# Patient Record
Sex: Female | Born: 1945 | Race: Black or African American | Hispanic: No | Smoking: Never smoker
Health system: Southern US, Community
[De-identification: ages and names within clinical notes are randomized; demographics above are authoritative.]

## PROBLEM LIST (undated history)

## (undated) DIAGNOSIS — J309 Allergic rhinitis, unspecified: Secondary | ICD-10-CM

## (undated) DIAGNOSIS — M1990 Unspecified osteoarthritis, unspecified site: Secondary | ICD-10-CM

## (undated) DIAGNOSIS — G4733 Obstructive sleep apnea (adult) (pediatric): Secondary | ICD-10-CM

## (undated) DIAGNOSIS — J329 Chronic sinusitis, unspecified: Secondary | ICD-10-CM

## (undated) DIAGNOSIS — K589 Irritable bowel syndrome without diarrhea: Secondary | ICD-10-CM

## (undated) DIAGNOSIS — R29898 Other symptoms and signs involving the musculoskeletal system: Secondary | ICD-10-CM

## (undated) DIAGNOSIS — H15109 Unspecified episcleritis, unspecified eye: Secondary | ICD-10-CM

## (undated) DIAGNOSIS — G4700 Insomnia, unspecified: Secondary | ICD-10-CM

## (undated) DIAGNOSIS — K219 Gastro-esophageal reflux disease without esophagitis: Secondary | ICD-10-CM

## (undated) DIAGNOSIS — I7300 Raynaud's syndrome without gangrene: Secondary | ICD-10-CM

## (undated) DIAGNOSIS — R079 Chest pain, unspecified: Secondary | ICD-10-CM

## (undated) DIAGNOSIS — E7800 Pure hypercholesterolemia, unspecified: Secondary | ICD-10-CM

## (undated) DIAGNOSIS — I10 Essential (primary) hypertension: Secondary | ICD-10-CM

## (undated) DIAGNOSIS — R269 Unspecified abnormalities of gait and mobility: Secondary | ICD-10-CM

## (undated) DIAGNOSIS — J45909 Unspecified asthma, uncomplicated: Secondary | ICD-10-CM

## (undated) DIAGNOSIS — D259 Leiomyoma of uterus, unspecified: Secondary | ICD-10-CM

## (undated) DIAGNOSIS — M797 Fibromyalgia: Secondary | ICD-10-CM

## (undated) DIAGNOSIS — J479 Bronchiectasis, uncomplicated: Secondary | ICD-10-CM

## (undated) DIAGNOSIS — Z780 Asymptomatic menopausal state: Secondary | ICD-10-CM

## (undated) DIAGNOSIS — R7309 Other abnormal glucose: Secondary | ICD-10-CM

## (undated) DIAGNOSIS — Z86010 Personal history of colonic polyps: Secondary | ICD-10-CM

## (undated) HISTORY — DX: Gastro-esophageal reflux disease without esophagitis: K21.9

## (undated) HISTORY — DX: Unspecified abnormalities of gait and mobility: R26.9

## (undated) HISTORY — DX: Irritable bowel syndrome without diarrhea: K58.9

## (undated) HISTORY — PX: NASAL SEPTUM SURGERY: SHX37

## (undated) HISTORY — DX: Other symptoms and signs involving the musculoskeletal system: R29.898

## (undated) HISTORY — PX: COLONOSCOPY W/ POLYPECTOMY: SHX1380

## (undated) HISTORY — DX: Unspecified osteoarthritis, unspecified site: M19.90

## (undated) HISTORY — DX: Bronchiectasis, uncomplicated: J47.9

## (undated) HISTORY — DX: Asymptomatic menopausal state: Z78.0

## (undated) HISTORY — DX: Unspecified asthma, uncomplicated: J45.909

## (undated) HISTORY — DX: Fibromyalgia: M79.7

## (undated) HISTORY — DX: Personal history of colonic polyps: Z86.010

## (undated) HISTORY — DX: Chronic sinusitis, unspecified: J32.9

## (undated) HISTORY — DX: Raynaud's syndrome without gangrene: I73.00

## (undated) HISTORY — DX: Allergic rhinitis, unspecified: J30.9

## (undated) HISTORY — DX: Unspecified episcleritis, unspecified eye: H15.109

## (undated) HISTORY — DX: Other abnormal glucose: R73.09

## (undated) HISTORY — DX: Chest pain, unspecified: R07.9

## (undated) HISTORY — DX: Leiomyoma of uterus, unspecified: D25.9

## (undated) HISTORY — DX: Essential (primary) hypertension: I10

## (undated) HISTORY — DX: Insomnia, unspecified: G47.00

## (undated) HISTORY — DX: Pure hypercholesterolemia, unspecified: E78.00

---

## 1998-11-21 ENCOUNTER — Encounter: Payer: Self-pay | Admitting: Gastroenterology

## 1999-11-14 ENCOUNTER — Encounter: Admission: RE | Admit: 1999-11-14 | Discharge: 1999-11-14 | Payer: Self-pay | Admitting: Allergy and Immunology

## 1999-11-14 ENCOUNTER — Encounter: Payer: Self-pay | Admitting: Pediatrics

## 2000-06-05 ENCOUNTER — Encounter: Payer: Self-pay | Admitting: Family Medicine

## 2000-06-05 ENCOUNTER — Encounter: Admission: RE | Admit: 2000-06-05 | Discharge: 2000-06-05 | Payer: Self-pay | Admitting: Family Medicine

## 2000-10-26 ENCOUNTER — Emergency Department (HOSPITAL_COMMUNITY): Admission: EM | Admit: 2000-10-26 | Discharge: 2000-10-26 | Payer: Self-pay | Admitting: Emergency Medicine

## 2000-10-26 ENCOUNTER — Encounter: Payer: Self-pay | Admitting: Emergency Medicine

## 2000-10-28 ENCOUNTER — Encounter: Payer: Self-pay | Admitting: Urology

## 2000-10-28 ENCOUNTER — Ambulatory Visit (HOSPITAL_COMMUNITY): Admission: RE | Admit: 2000-10-28 | Discharge: 2000-10-28 | Payer: Self-pay | Admitting: Urology

## 2000-10-29 ENCOUNTER — Emergency Department (HOSPITAL_COMMUNITY): Admission: EM | Admit: 2000-10-29 | Discharge: 2000-10-29 | Payer: Self-pay | Admitting: Emergency Medicine

## 2000-11-01 ENCOUNTER — Ambulatory Visit (HOSPITAL_COMMUNITY): Admission: RE | Admit: 2000-11-01 | Discharge: 2000-11-01 | Payer: Self-pay | Admitting: Urology

## 2001-04-11 ENCOUNTER — Encounter: Payer: Self-pay | Admitting: Family Medicine

## 2001-04-11 ENCOUNTER — Encounter: Admission: RE | Admit: 2001-04-11 | Discharge: 2001-04-11 | Payer: Self-pay | Admitting: Family Medicine

## 2001-06-06 ENCOUNTER — Encounter: Admission: RE | Admit: 2001-06-06 | Discharge: 2001-06-06 | Payer: Self-pay | Admitting: Obstetrics and Gynecology

## 2001-06-06 ENCOUNTER — Encounter: Payer: Self-pay | Admitting: Obstetrics and Gynecology

## 2001-06-11 ENCOUNTER — Encounter: Admission: RE | Admit: 2001-06-11 | Discharge: 2001-06-11 | Payer: Self-pay | Admitting: Obstetrics and Gynecology

## 2001-06-11 ENCOUNTER — Encounter: Payer: Self-pay | Admitting: Obstetrics and Gynecology

## 2001-07-08 ENCOUNTER — Encounter: Payer: Self-pay | Admitting: Gastroenterology

## 2001-07-08 ENCOUNTER — Other Ambulatory Visit: Admission: RE | Admit: 2001-07-08 | Discharge: 2001-07-08 | Payer: Self-pay | Admitting: Gastroenterology

## 2001-07-08 ENCOUNTER — Encounter (INDEPENDENT_AMBULATORY_CARE_PROVIDER_SITE_OTHER): Payer: Self-pay | Admitting: Specialist

## 2001-07-12 ENCOUNTER — Emergency Department (HOSPITAL_COMMUNITY): Admission: EM | Admit: 2001-07-12 | Discharge: 2001-07-12 | Payer: Self-pay

## 2002-02-13 ENCOUNTER — Encounter: Payer: Self-pay | Admitting: Endocrinology

## 2002-02-13 HISTORY — PX: OTHER SURGICAL HISTORY: SHX169

## 2002-02-16 ENCOUNTER — Encounter: Payer: Self-pay | Admitting: Obstetrics and Gynecology

## 2002-02-18 ENCOUNTER — Ambulatory Visit (HOSPITAL_COMMUNITY): Admission: RE | Admit: 2002-02-18 | Discharge: 2002-02-18 | Payer: Self-pay | Admitting: Obstetrics and Gynecology

## 2002-02-18 ENCOUNTER — Encounter (INDEPENDENT_AMBULATORY_CARE_PROVIDER_SITE_OTHER): Payer: Self-pay

## 2002-02-18 ENCOUNTER — Encounter (INDEPENDENT_AMBULATORY_CARE_PROVIDER_SITE_OTHER): Payer: Self-pay | Admitting: *Deleted

## 2002-07-14 ENCOUNTER — Encounter: Payer: Self-pay | Admitting: Obstetrics and Gynecology

## 2002-07-14 ENCOUNTER — Encounter: Admission: RE | Admit: 2002-07-14 | Discharge: 2002-07-14 | Payer: Self-pay | Admitting: Obstetrics and Gynecology

## 2003-04-08 ENCOUNTER — Encounter: Payer: Self-pay | Admitting: Gastroenterology

## 2003-09-28 ENCOUNTER — Encounter: Admission: RE | Admit: 2003-09-28 | Discharge: 2003-09-28 | Payer: Self-pay | Admitting: Otolaryngology

## 2003-10-19 ENCOUNTER — Encounter: Admission: RE | Admit: 2003-10-19 | Discharge: 2003-10-19 | Payer: Self-pay | Admitting: Obstetrics and Gynecology

## 2004-03-07 ENCOUNTER — Encounter: Admission: RE | Admit: 2004-03-07 | Discharge: 2004-03-07 | Payer: Self-pay | Admitting: Otolaryngology

## 2004-09-01 ENCOUNTER — Ambulatory Visit: Payer: Self-pay | Admitting: Endocrinology

## 2004-11-10 ENCOUNTER — Ambulatory Visit: Payer: Self-pay | Admitting: Internal Medicine

## 2005-02-08 ENCOUNTER — Ambulatory Visit: Payer: Self-pay | Admitting: Endocrinology

## 2005-02-15 ENCOUNTER — Ambulatory Visit: Payer: Self-pay | Admitting: Endocrinology

## 2005-07-11 ENCOUNTER — Ambulatory Visit: Payer: Self-pay | Admitting: Endocrinology

## 2005-11-14 ENCOUNTER — Ambulatory Visit: Payer: Self-pay | Admitting: Endocrinology

## 2006-02-11 ENCOUNTER — Ambulatory Visit: Payer: Self-pay | Admitting: Endocrinology

## 2006-02-22 ENCOUNTER — Ambulatory Visit: Payer: Self-pay | Admitting: Endocrinology

## 2006-04-22 ENCOUNTER — Ambulatory Visit (HOSPITAL_COMMUNITY): Admission: RE | Admit: 2006-04-22 | Discharge: 2006-04-22 | Payer: Self-pay | Admitting: Endocrinology

## 2006-11-07 ENCOUNTER — Ambulatory Visit: Payer: Self-pay | Admitting: Endocrinology

## 2006-11-28 ENCOUNTER — Encounter: Admission: RE | Admit: 2006-11-28 | Discharge: 2006-11-28 | Payer: Self-pay | Admitting: Obstetrics and Gynecology

## 2006-12-04 ENCOUNTER — Ambulatory Visit: Payer: Self-pay | Admitting: Endocrinology

## 2006-12-04 HISTORY — PX: ELECTROCARDIOGRAM: SHX264

## 2006-12-14 ENCOUNTER — Encounter: Admission: RE | Admit: 2006-12-14 | Discharge: 2006-12-14 | Payer: Self-pay | Admitting: Endocrinology

## 2006-12-16 ENCOUNTER — Ambulatory Visit: Payer: Self-pay | Admitting: Internal Medicine

## 2007-04-14 ENCOUNTER — Encounter: Payer: Self-pay | Admitting: Endocrinology

## 2007-04-14 DIAGNOSIS — K219 Gastro-esophageal reflux disease without esophagitis: Secondary | ICD-10-CM

## 2007-04-14 DIAGNOSIS — J45909 Unspecified asthma, uncomplicated: Secondary | ICD-10-CM

## 2007-04-14 DIAGNOSIS — J4520 Mild intermittent asthma, uncomplicated: Secondary | ICD-10-CM | POA: Insufficient documentation

## 2007-04-14 DIAGNOSIS — Z860100 Personal history of colon polyps, unspecified: Secondary | ICD-10-CM

## 2007-04-14 DIAGNOSIS — M810 Age-related osteoporosis without current pathological fracture: Secondary | ICD-10-CM | POA: Insufficient documentation

## 2007-04-14 HISTORY — DX: Unspecified asthma, uncomplicated: J45.909

## 2007-04-14 HISTORY — DX: Personal history of colon polyps, unspecified: Z86.0100

## 2007-04-14 HISTORY — DX: Gastro-esophageal reflux disease without esophagitis: K21.9

## 2007-04-14 HISTORY — DX: Personal history of colonic polyps: Z86.010

## 2007-05-21 ENCOUNTER — Ambulatory Visit: Payer: Self-pay | Admitting: Endocrinology

## 2007-05-21 LAB — CONVERTED CEMR LAB
ALT: 17 units/L (ref 0–35)
AST: 25 units/L (ref 0–37)
Albumin: 4 g/dL (ref 3.5–5.2)
Alkaline Phosphatase: 58 units/L (ref 39–117)
BUN: 12 mg/dL (ref 6–23)
Basophils Absolute: 0 10*3/uL (ref 0.0–0.1)
Basophils Relative: 0.4 % (ref 0.0–1.0)
Bilirubin Urine: NEGATIVE
Bilirubin, Direct: 0.2 mg/dL (ref 0.0–0.3)
CO2: 33 meq/L — ABNORMAL HIGH (ref 19–32)
Calcium: 9.9 mg/dL (ref 8.4–10.5)
Chloride: 104 meq/L (ref 96–112)
Cholesterol: 167 mg/dL (ref 0–200)
Creatinine, Ser: 0.9 mg/dL (ref 0.4–1.2)
Crystals: NEGATIVE
Eosinophils Absolute: 0.1 10*3/uL (ref 0.0–0.6)
Eosinophils Relative: 1.1 % (ref 0.0–5.0)
GFR calc Af Amer: 82 mL/min
GFR calc non Af Amer: 68 mL/min
Glucose, Bld: 100 mg/dL — ABNORMAL HIGH (ref 70–99)
HCT: 38.1 % (ref 36.0–46.0)
HDL: 94.5 mg/dL (ref >39.0)
Hemoglobin, Urine: NEGATIVE
Hemoglobin: 13.2 g/dL (ref 12.0–15.0)
Hgb A1c MFr Bld: 5.8 % (ref 4.6–6.0)
Ketones, ur: NEGATIVE mg/dL
LDL Cholesterol: 57 mg/dL (ref 0–99)
Lymphocytes Relative: 32.9 % (ref 12.0–46.0)
MCHC: 34.7 g/dL (ref 30.0–36.0)
MCV: 88 fL (ref 78.0–100.0)
Monocytes Absolute: 0.5 10*3/uL (ref 0.2–0.7)
Monocytes Relative: 7.2 % (ref 3.0–11.0)
Neutro Abs: 3.8 10*3/uL (ref 1.4–7.7)
Neutrophils Relative %: 58.4 % (ref 43.0–77.0)
Nitrite: NEGATIVE
Platelets: 170 10*3/uL (ref 150–400)
Potassium: 3.9 meq/L (ref 3.5–5.1)
RBC / HPF: NONE SEEN
RBC: 4.33 M/uL (ref 3.87–5.11)
RDW: 12.7 % (ref 11.5–14.6)
Sodium: 142 meq/L (ref 135–145)
Specific Gravity, Urine: 1.015 (ref 1.000–1.03)
TSH: 1.72 microintl units/mL (ref 0.35–5.50)
Total Bilirubin: 0.7 mg/dL (ref 0.3–1.2)
Total CHOL/HDL Ratio: 1.8
Total Protein, Urine: NEGATIVE mg/dL
Total Protein: 7.3 g/dL (ref 6.0–8.3)
Triglycerides: 76 mg/dL (ref 0–149)
Urine Glucose: NEGATIVE mg/dL
Urobilinogen, UA: 0.2 (ref 0.0–1.0)
VLDL: 15 mg/dL (ref 0–40)
WBC: 6.5 10*3/uL (ref 4.5–10.5)
pH: 6.5 (ref 5.0–8.0)

## 2007-07-07 ENCOUNTER — Telehealth (INDEPENDENT_AMBULATORY_CARE_PROVIDER_SITE_OTHER): Payer: Self-pay | Admitting: *Deleted

## 2007-07-07 ENCOUNTER — Telehealth: Payer: Self-pay | Admitting: Internal Medicine

## 2007-07-28 ENCOUNTER — Telehealth (INDEPENDENT_AMBULATORY_CARE_PROVIDER_SITE_OTHER): Payer: Self-pay | Admitting: *Deleted

## 2007-08-28 ENCOUNTER — Telehealth (INDEPENDENT_AMBULATORY_CARE_PROVIDER_SITE_OTHER): Payer: Self-pay | Admitting: *Deleted

## 2007-08-28 ENCOUNTER — Telehealth: Payer: Self-pay | Admitting: Endocrinology

## 2007-08-29 ENCOUNTER — Ambulatory Visit: Payer: Self-pay | Admitting: Endocrinology

## 2007-08-29 DIAGNOSIS — M79609 Pain in unspecified limb: Secondary | ICD-10-CM | POA: Insufficient documentation

## 2007-08-29 DIAGNOSIS — M79606 Pain in leg, unspecified: Secondary | ICD-10-CM | POA: Insufficient documentation

## 2007-09-01 ENCOUNTER — Encounter (INDEPENDENT_AMBULATORY_CARE_PROVIDER_SITE_OTHER): Payer: Self-pay | Admitting: *Deleted

## 2007-09-01 LAB — CONVERTED CEMR LAB: Sed Rate: 25 mm/hr (ref 0–25)

## 2007-09-22 ENCOUNTER — Encounter: Payer: Self-pay | Admitting: Internal Medicine

## 2007-10-02 ENCOUNTER — Encounter: Payer: Self-pay | Admitting: Endocrinology

## 2007-10-03 ENCOUNTER — Telehealth (INDEPENDENT_AMBULATORY_CARE_PROVIDER_SITE_OTHER): Payer: Self-pay | Admitting: *Deleted

## 2007-10-22 ENCOUNTER — Telehealth (INDEPENDENT_AMBULATORY_CARE_PROVIDER_SITE_OTHER): Payer: Self-pay | Admitting: *Deleted

## 2007-10-22 ENCOUNTER — Telehealth: Payer: Self-pay | Admitting: Endocrinology

## 2008-01-07 ENCOUNTER — Ambulatory Visit: Payer: Self-pay | Admitting: Endocrinology

## 2008-01-07 DIAGNOSIS — E7800 Pure hypercholesterolemia, unspecified: Secondary | ICD-10-CM | POA: Insufficient documentation

## 2008-01-07 DIAGNOSIS — H409 Unspecified glaucoma: Secondary | ICD-10-CM | POA: Insufficient documentation

## 2008-01-07 HISTORY — DX: Pure hypercholesterolemia, unspecified: E78.00

## 2008-01-28 ENCOUNTER — Ambulatory Visit: Payer: Self-pay | Admitting: Endocrinology

## 2008-01-28 DIAGNOSIS — I10 Essential (primary) hypertension: Secondary | ICD-10-CM

## 2008-01-28 HISTORY — DX: Essential (primary) hypertension: I10

## 2008-01-29 LAB — CONVERTED CEMR LAB
ALT: 19 units/L (ref 0–35)
AST: 35 units/L (ref 0–37)
Albumin: 4.3 g/dL (ref 3.5–5.2)
Alkaline Phosphatase: 53 units/L (ref 39–117)
Bilirubin, Direct: 0.1 mg/dL (ref 0.0–0.3)
Cholesterol: 165 mg/dL (ref 0–200)
HDL: 97.9 mg/dL (ref >39.0)
LDL Cholesterol: 59 mg/dL (ref 0–99)
Total Bilirubin: 0.8 mg/dL (ref 0.3–1.2)
Total CHOL/HDL Ratio: 1.7
Total Protein: 7.6 g/dL (ref 6.0–8.3)
Triglycerides: 43 mg/dL (ref 0–149)
VLDL: 9 mg/dL (ref 0–40)

## 2008-03-22 ENCOUNTER — Ambulatory Visit: Payer: Self-pay | Admitting: Endocrinology

## 2008-05-10 ENCOUNTER — Ambulatory Visit: Payer: Self-pay | Admitting: Endocrinology

## 2008-05-10 ENCOUNTER — Ambulatory Visit: Payer: Self-pay | Admitting: Family Medicine

## 2008-05-10 DIAGNOSIS — Z780 Asymptomatic menopausal state: Secondary | ICD-10-CM

## 2008-05-10 DIAGNOSIS — D259 Leiomyoma of uterus, unspecified: Secondary | ICD-10-CM | POA: Insufficient documentation

## 2008-05-10 DIAGNOSIS — G4700 Insomnia, unspecified: Secondary | ICD-10-CM | POA: Insufficient documentation

## 2008-05-10 HISTORY — DX: Asymptomatic menopausal state: Z78.0

## 2008-05-10 HISTORY — DX: Leiomyoma of uterus, unspecified: D25.9

## 2008-05-10 HISTORY — DX: Insomnia, unspecified: G47.00

## 2008-05-12 LAB — CONVERTED CEMR LAB
BUN: 15 mg/dL (ref 6–23)
Basophils Absolute: 0 10*3/uL (ref 0.0–0.1)
Basophils Relative: 0.9 % (ref 0.0–3.0)
Bilirubin Urine: NEGATIVE
CO2: 27 meq/L (ref 19–32)
Calcium: 9.2 mg/dL (ref 8.4–10.5)
Chloride: 111 meq/L (ref 96–112)
Cholesterol: 160 mg/dL (ref 0–200)
Creatinine, Ser: 0.8 mg/dL (ref 0.4–1.2)
Crystals: NEGATIVE
Eosinophils Absolute: 0.1 10*3/uL (ref 0.0–0.7)
Eosinophils Relative: 2 % (ref 0.0–5.0)
GFR calc Af Amer: 93 mL/min
GFR calc non Af Amer: 77 mL/min
Glucose, Bld: 88 mg/dL (ref 70–99)
HCT: 34.6 % — ABNORMAL LOW (ref 36.0–46.0)
HDL: 92 mg/dL (ref >39.0)
Hemoglobin, Urine: NEGATIVE
Hemoglobin: 11.9 g/dL — ABNORMAL LOW (ref 12.0–15.0)
Ketones, ur: NEGATIVE mg/dL
LDL Cholesterol: 53 mg/dL (ref 0–99)
Leukocytes, UA: NEGATIVE
Lymphocytes Relative: 32.7 % (ref 12.0–46.0)
MCHC: 34.5 g/dL (ref 30.0–36.0)
MCV: 88.7 fL (ref 78.0–100.0)
Monocytes Absolute: 0.4 10*3/uL (ref 0.1–1.0)
Monocytes Relative: 7.2 % (ref 3.0–12.0)
Mucus, UA: NEGATIVE
Neutro Abs: 3.2 10*3/uL (ref 1.4–7.7)
Neutrophils Relative %: 57.2 % (ref 43.0–77.0)
Nitrite: NEGATIVE
Platelets: 151 10*3/uL (ref 150–400)
Potassium: 3.6 meq/L (ref 3.5–5.1)
RBC / HPF: NONE SEEN
RBC: 3.9 M/uL (ref 3.87–5.11)
RDW: 12.7 % (ref 11.5–14.6)
Sodium: 151 meq/L — ABNORMAL HIGH (ref 135–145)
Specific Gravity, Urine: 1.015 (ref 1.000–1.03)
TSH: 1.79 microintl units/mL (ref 0.35–5.50)
Total CHOL/HDL Ratio: 1.7
Total Protein, Urine: NEGATIVE mg/dL
Triglycerides: 73 mg/dL (ref 0–149)
Urine Glucose: NEGATIVE mg/dL
Urobilinogen, UA: 0.2 (ref 0.0–1.0)
VLDL: 15 mg/dL (ref 0–40)
WBC: 5.5 10*3/uL (ref 4.5–10.5)
pH: 7 (ref 5.0–8.0)

## 2008-05-25 ENCOUNTER — Telehealth: Payer: Self-pay | Admitting: Endocrinology

## 2008-06-14 ENCOUNTER — Telehealth: Payer: Self-pay | Admitting: Endocrinology

## 2008-06-16 ENCOUNTER — Ambulatory Visit: Payer: Self-pay | Admitting: Endocrinology

## 2008-06-17 LAB — CONVERTED CEMR LAB
BUN: 12 mg/dL (ref 6–23)
Basophils Absolute: 0 10*3/uL (ref 0.0–0.1)
Basophils Relative: 0.3 % (ref 0.0–3.0)
CO2: 33 meq/L — ABNORMAL HIGH (ref 19–32)
Calcium: 9.6 mg/dL (ref 8.4–10.5)
Chloride: 103 meq/L (ref 96–112)
Creatinine, Ser: 0.8 mg/dL (ref 0.4–1.2)
Eosinophils Absolute: 0 10*3/uL (ref 0.0–0.7)
Eosinophils Relative: 0.6 % (ref 0.0–5.0)
Folate: >20 ng/mL
GFR calc Af Amer: 93 mL/min
GFR calc non Af Amer: 77 mL/min
Glucose, Bld: 92 mg/dL (ref 70–99)
HCT: 37.6 % (ref 36.0–46.0)
Hemoglobin: 13 g/dL (ref 12.0–15.0)
Iron: 56 ug/dL (ref 42–145)
Lymphocytes Relative: 36.3 % (ref 12.0–46.0)
MCHC: 34.6 g/dL (ref 30.0–36.0)
MCV: 87.5 fL (ref 78.0–100.0)
Monocytes Absolute: 0.5 10*3/uL (ref 0.1–1.0)
Monocytes Relative: 7.9 % (ref 3.0–12.0)
Neutro Abs: 3.9 10*3/uL (ref 1.4–7.7)
Neutrophils Relative %: 54.9 % (ref 43.0–77.0)
Platelets: 198 10*3/uL (ref 150–400)
Potassium: 3.9 meq/L (ref 3.5–5.1)
RBC: 4.3 M/uL (ref 3.87–5.11)
RDW: 12.6 % (ref 11.5–14.6)
Sodium: 144 meq/L (ref 135–145)
Vitamin B-12: 924 pg/mL — ABNORMAL HIGH (ref 211–911)
WBC: 6.9 10*3/uL (ref 4.5–10.5)

## 2008-07-12 ENCOUNTER — Encounter: Payer: Self-pay | Admitting: Endocrinology

## 2008-07-15 ENCOUNTER — Ambulatory Visit: Payer: Self-pay | Admitting: Endocrinology

## 2008-07-26 ENCOUNTER — Telehealth (INDEPENDENT_AMBULATORY_CARE_PROVIDER_SITE_OTHER): Payer: Self-pay | Admitting: *Deleted

## 2008-07-26 ENCOUNTER — Ambulatory Visit: Payer: Self-pay | Admitting: Endocrinology

## 2008-07-26 DIAGNOSIS — J309 Allergic rhinitis, unspecified: Secondary | ICD-10-CM

## 2008-07-26 HISTORY — DX: Allergic rhinitis, unspecified: J30.9

## 2008-08-25 ENCOUNTER — Ambulatory Visit: Payer: Self-pay | Admitting: Endocrinology

## 2008-08-26 ENCOUNTER — Ambulatory Visit: Payer: Self-pay | Admitting: Internal Medicine

## 2008-08-26 ENCOUNTER — Telehealth (INDEPENDENT_AMBULATORY_CARE_PROVIDER_SITE_OTHER): Payer: Self-pay | Admitting: *Deleted

## 2008-08-26 DIAGNOSIS — R079 Chest pain, unspecified: Secondary | ICD-10-CM

## 2008-08-26 HISTORY — DX: Chest pain, unspecified: R07.9

## 2008-09-20 ENCOUNTER — Encounter: Admission: RE | Admit: 2008-09-20 | Discharge: 2008-09-20 | Payer: Self-pay | Admitting: Endocrinology

## 2008-10-06 ENCOUNTER — Encounter: Admission: RE | Admit: 2008-10-06 | Discharge: 2008-10-06 | Payer: Self-pay | Admitting: Otolaryngology

## 2008-12-06 ENCOUNTER — Encounter: Payer: Self-pay | Admitting: Endocrinology

## 2009-01-13 ENCOUNTER — Encounter: Payer: Self-pay | Admitting: Endocrinology

## 2009-02-10 ENCOUNTER — Ambulatory Visit: Payer: Self-pay | Admitting: Internal Medicine

## 2009-02-10 DIAGNOSIS — J329 Chronic sinusitis, unspecified: Secondary | ICD-10-CM | POA: Insufficient documentation

## 2009-02-10 HISTORY — DX: Chronic sinusitis, unspecified: J32.9

## 2009-05-25 ENCOUNTER — Ambulatory Visit: Payer: Self-pay | Admitting: Internal Medicine

## 2009-05-25 DIAGNOSIS — N390 Urinary tract infection, site not specified: Secondary | ICD-10-CM | POA: Insufficient documentation

## 2009-05-25 LAB — CONVERTED CEMR LAB
Bilirubin Urine: NEGATIVE
Blood in Urine, dipstick: NEGATIVE
Glucose, Urine, Semiquant: NEGATIVE
Ketones, urine, test strip: NEGATIVE
Nitrite: NEGATIVE
Protein, U semiquant: NEGATIVE
Specific Gravity, Urine: <1.005
Urobilinogen, UA: 0.2
WBC Urine, dipstick: NEGATIVE
pH: 5

## 2009-09-07 ENCOUNTER — Telehealth: Payer: Self-pay | Admitting: Endocrinology

## 2009-10-27 ENCOUNTER — Encounter: Payer: Self-pay | Admitting: Endocrinology

## 2009-11-23 ENCOUNTER — Ambulatory Visit: Payer: Self-pay | Admitting: Endocrinology

## 2009-11-23 ENCOUNTER — Ambulatory Visit: Payer: Self-pay | Admitting: Internal Medicine

## 2009-11-23 LAB — CONVERTED CEMR LAB
ALT: 15 units/L (ref 0–35)
AST: 22 units/L (ref 0–37)
Albumin: 4.1 g/dL (ref 3.5–5.2)
Alkaline Phosphatase: 59 units/L (ref 39–117)
BUN: 13 mg/dL (ref 6–23)
Basophils Absolute: 0 10*3/uL (ref 0.0–0.1)
Basophils Relative: 0.2 % (ref 0.0–3.0)
Bilirubin Urine: NEGATIVE
Bilirubin, Direct: 0.1 mg/dL (ref 0.0–0.3)
CO2: 32 meq/L (ref 19–32)
Calcium: 10.2 mg/dL (ref 8.4–10.5)
Chloride: 106 meq/L (ref 96–112)
Cholesterol: 167 mg/dL (ref 0–200)
Creatinine, Ser: 0.9 mg/dL (ref 0.4–1.2)
Eosinophils Absolute: 0.1 10*3/uL (ref 0.0–0.7)
Eosinophils Relative: 1.2 % (ref 0.0–5.0)
GFR calc non Af Amer: 81 mL/min (ref >60)
Glucose, Bld: 95 mg/dL (ref 70–99)
HCT: 39.1 % (ref 36.0–46.0)
HDL: 106.1 mg/dL (ref >39.00)
Hemoglobin, Urine: NEGATIVE
Hemoglobin: 13 g/dL (ref 12.0–15.0)
IgE (Immunoglobulin E), Serum: 7.9 intl units/mL (ref 0.0–180.0)
Ketones, ur: NEGATIVE mg/dL
LDL Cholesterol: 51 mg/dL (ref 0–99)
Leukocytes, UA: NEGATIVE
Lymphocytes Relative: 36.8 % (ref 12.0–46.0)
Lymphs Abs: 2.1 10*3/uL (ref 0.7–4.0)
MCHC: 33.2 g/dL (ref 30.0–36.0)
MCV: 88.8 fL (ref 78.0–100.0)
Monocytes Absolute: 0.5 10*3/uL (ref 0.1–1.0)
Monocytes Relative: 8.7 % (ref 3.0–12.0)
Neutro Abs: 2.9 10*3/uL (ref 1.4–7.7)
Neutrophils Relative %: 53.1 % (ref 43.0–77.0)
Nitrite: NEGATIVE
Platelets: 171 10*3/uL (ref 150.0–400.0)
Potassium: 4.1 meq/L (ref 3.5–5.1)
RBC: 4.41 M/uL (ref 3.87–5.11)
RDW: 13 % (ref 11.5–14.6)
Sodium: 143 meq/L (ref 135–145)
Specific Gravity, Urine: >=1.03 (ref 1.000–1.030)
TSH: 2.56 microintl units/mL (ref 0.35–5.50)
Total Bilirubin: 0.4 mg/dL (ref 0.3–1.2)
Total CHOL/HDL Ratio: 2
Total Protein, Urine: NEGATIVE mg/dL
Total Protein: 7.3 g/dL (ref 6.0–8.3)
Triglycerides: 49 mg/dL (ref 0.0–149.0)
Urine Glucose: NEGATIVE mg/dL
Urobilinogen, UA: 0.2 (ref 0.0–1.0)
VLDL: 9.8 mg/dL (ref 0.0–40.0)
WBC: 5.6 10*3/uL (ref 4.5–10.5)
pH: 5.5 (ref 5.0–8.0)

## 2009-11-28 ENCOUNTER — Telehealth: Payer: Self-pay | Admitting: Internal Medicine

## 2009-11-28 ENCOUNTER — Ambulatory Visit: Payer: Self-pay | Admitting: Endocrinology

## 2009-11-28 DIAGNOSIS — M1990 Unspecified osteoarthritis, unspecified site: Secondary | ICD-10-CM | POA: Insufficient documentation

## 2009-11-28 DIAGNOSIS — R7309 Other abnormal glucose: Secondary | ICD-10-CM | POA: Insufficient documentation

## 2009-11-28 DIAGNOSIS — N209 Urinary calculus, unspecified: Secondary | ICD-10-CM | POA: Insufficient documentation

## 2009-11-28 HISTORY — DX: Other abnormal glucose: R73.09

## 2009-11-28 HISTORY — DX: Unspecified osteoarthritis, unspecified site: M19.90

## 2009-12-07 ENCOUNTER — Encounter: Admission: RE | Admit: 2009-12-07 | Discharge: 2009-12-07 | Payer: Self-pay | Admitting: Endocrinology

## 2009-12-07 DIAGNOSIS — E041 Nontoxic single thyroid nodule: Secondary | ICD-10-CM | POA: Insufficient documentation

## 2009-12-12 ENCOUNTER — Telehealth (INDEPENDENT_AMBULATORY_CARE_PROVIDER_SITE_OTHER): Payer: Self-pay | Admitting: *Deleted

## 2009-12-13 ENCOUNTER — Ambulatory Visit: Payer: Self-pay | Admitting: Internal Medicine

## 2009-12-25 ENCOUNTER — Ambulatory Visit: Payer: Self-pay | Admitting: Cardiovascular Disease

## 2009-12-25 ENCOUNTER — Observation Stay (HOSPITAL_COMMUNITY): Admission: EM | Admit: 2009-12-25 | Discharge: 2009-12-26 | Payer: Self-pay | Admitting: Emergency Medicine

## 2009-12-25 LAB — CONVERTED CEMR LAB
BUN: 14 mg/dL
Basophils Relative: 0 %
CO2: 29 meq/L
Calcium: 9.7 mg/dL
Chloride: 104 meq/L
Creatinine, Ser: 0.83 mg/dL
Eosinophils Relative: 0 %
Glucose, Bld: 92 mg/dL
HCT: 38.3 %
Hemoglobin: 13.3 g/dL
Lymphocytes, automated: 33 %
MCV: 88.1 fL
Monocytes Relative: 6 %
Neutrophils Relative %: 61 %
Platelets: 187 10*3/uL
Potassium: 3.4 meq/L
RBC: 4.35 M/uL
RDW: 13.9 %
Sodium: 138 meq/L
WBC: 7.3 10*3/uL

## 2009-12-26 ENCOUNTER — Encounter (INDEPENDENT_AMBULATORY_CARE_PROVIDER_SITE_OTHER): Payer: Self-pay | Admitting: Emergency Medicine

## 2009-12-27 ENCOUNTER — Ambulatory Visit: Payer: Self-pay | Admitting: Internal Medicine

## 2010-01-11 ENCOUNTER — Ambulatory Visit: Payer: Self-pay | Admitting: Internal Medicine

## 2010-01-11 ENCOUNTER — Encounter: Payer: Self-pay | Admitting: Internal Medicine

## 2010-01-13 ENCOUNTER — Telehealth: Payer: Self-pay | Admitting: Internal Medicine

## 2010-01-13 ENCOUNTER — Telehealth (INDEPENDENT_AMBULATORY_CARE_PROVIDER_SITE_OTHER): Payer: Self-pay | Admitting: *Deleted

## 2010-01-17 ENCOUNTER — Telehealth: Payer: Self-pay | Admitting: Internal Medicine

## 2010-01-18 ENCOUNTER — Encounter: Payer: Self-pay | Admitting: Internal Medicine

## 2010-01-19 ENCOUNTER — Ambulatory Visit: Payer: Self-pay | Admitting: Internal Medicine

## 2010-02-20 ENCOUNTER — Ambulatory Visit: Payer: Self-pay | Admitting: Internal Medicine

## 2010-02-20 ENCOUNTER — Telehealth: Payer: Self-pay | Admitting: Internal Medicine

## 2010-02-23 ENCOUNTER — Ambulatory Visit: Payer: Self-pay | Admitting: Internal Medicine

## 2010-02-27 ENCOUNTER — Telehealth: Payer: Self-pay | Admitting: Endocrinology

## 2010-03-03 ENCOUNTER — Ambulatory Visit: Payer: Self-pay | Admitting: Internal Medicine

## 2010-03-06 ENCOUNTER — Ambulatory Visit: Payer: Self-pay | Admitting: Internal Medicine

## 2010-03-07 ENCOUNTER — Ambulatory Visit: Payer: Self-pay | Admitting: Internal Medicine

## 2010-03-09 ENCOUNTER — Ambulatory Visit: Payer: Self-pay | Admitting: Internal Medicine

## 2010-03-14 ENCOUNTER — Ambulatory Visit: Payer: Self-pay | Admitting: Internal Medicine

## 2010-03-16 ENCOUNTER — Telehealth: Payer: Self-pay | Admitting: Internal Medicine

## 2010-03-17 ENCOUNTER — Ambulatory Visit: Payer: Self-pay | Admitting: Internal Medicine

## 2010-03-20 ENCOUNTER — Ambulatory Visit: Payer: Self-pay | Admitting: Internal Medicine

## 2010-03-20 ENCOUNTER — Ambulatory Visit: Payer: Self-pay | Admitting: Endocrinology

## 2010-03-23 ENCOUNTER — Ambulatory Visit: Payer: Self-pay | Admitting: Internal Medicine

## 2010-03-27 ENCOUNTER — Ambulatory Visit: Payer: Self-pay | Admitting: Internal Medicine

## 2010-03-30 ENCOUNTER — Ambulatory Visit: Payer: Self-pay | Admitting: Internal Medicine

## 2010-04-03 ENCOUNTER — Ambulatory Visit: Payer: Self-pay | Admitting: Internal Medicine

## 2010-04-04 ENCOUNTER — Telehealth: Payer: Self-pay | Admitting: Gastroenterology

## 2010-04-06 ENCOUNTER — Ambulatory Visit: Payer: Self-pay | Admitting: Gastroenterology

## 2010-04-06 ENCOUNTER — Ambulatory Visit: Payer: Self-pay | Admitting: Internal Medicine

## 2010-04-06 DIAGNOSIS — R197 Diarrhea, unspecified: Secondary | ICD-10-CM | POA: Insufficient documentation

## 2010-04-06 DIAGNOSIS — K589 Irritable bowel syndrome without diarrhea: Secondary | ICD-10-CM | POA: Insufficient documentation

## 2010-04-06 HISTORY — DX: Irritable bowel syndrome, unspecified: K58.9

## 2010-04-10 ENCOUNTER — Ambulatory Visit: Payer: Self-pay | Admitting: Internal Medicine

## 2010-04-11 ENCOUNTER — Encounter: Payer: Self-pay | Admitting: Gastroenterology

## 2010-04-13 ENCOUNTER — Ambulatory Visit: Payer: Self-pay | Admitting: Internal Medicine

## 2010-04-14 ENCOUNTER — Ambulatory Visit: Payer: Self-pay | Admitting: Internal Medicine

## 2010-04-17 ENCOUNTER — Ambulatory Visit: Payer: Self-pay | Admitting: Internal Medicine

## 2010-04-18 ENCOUNTER — Telehealth: Payer: Self-pay | Admitting: Gastroenterology

## 2010-04-21 ENCOUNTER — Ambulatory Visit: Payer: Self-pay | Admitting: Internal Medicine

## 2010-04-21 ENCOUNTER — Ambulatory Visit: Payer: Self-pay | Admitting: Gastroenterology

## 2010-04-21 LAB — HM COLONOSCOPY

## 2010-04-24 ENCOUNTER — Ambulatory Visit: Payer: Self-pay | Admitting: Internal Medicine

## 2010-04-24 LAB — CONVERTED CEMR LAB
Fecal Occult Blood: NEGATIVE
OCCULT 1: NEGATIVE
OCCULT 2: NEGATIVE
OCCULT 3: NEGATIVE
OCCULT 4: NEGATIVE
OCCULT 5: NEGATIVE

## 2010-04-25 ENCOUNTER — Telehealth (INDEPENDENT_AMBULATORY_CARE_PROVIDER_SITE_OTHER): Payer: Self-pay | Admitting: *Deleted

## 2010-04-25 ENCOUNTER — Telehealth: Payer: Self-pay | Admitting: Gastroenterology

## 2010-04-25 ENCOUNTER — Encounter: Payer: Self-pay | Admitting: Gastroenterology

## 2010-04-27 ENCOUNTER — Ambulatory Visit: Payer: Self-pay | Admitting: Internal Medicine

## 2010-05-01 ENCOUNTER — Ambulatory Visit: Payer: Self-pay | Admitting: Internal Medicine

## 2010-05-04 ENCOUNTER — Ambulatory Visit: Payer: Self-pay | Admitting: Internal Medicine

## 2010-05-08 ENCOUNTER — Ambulatory Visit: Payer: Self-pay | Admitting: Internal Medicine

## 2010-05-09 ENCOUNTER — Ambulatory Visit: Payer: Self-pay | Admitting: Internal Medicine

## 2010-05-11 ENCOUNTER — Ambulatory Visit: Payer: Self-pay | Admitting: Internal Medicine

## 2010-05-16 ENCOUNTER — Ambulatory Visit: Payer: Self-pay | Admitting: Internal Medicine

## 2010-05-18 ENCOUNTER — Telehealth (INDEPENDENT_AMBULATORY_CARE_PROVIDER_SITE_OTHER): Payer: Self-pay | Admitting: *Deleted

## 2010-05-19 ENCOUNTER — Ambulatory Visit: Payer: Self-pay | Admitting: Internal Medicine

## 2010-05-22 ENCOUNTER — Ambulatory Visit: Payer: Self-pay | Admitting: Internal Medicine

## 2010-05-26 ENCOUNTER — Ambulatory Visit: Payer: Self-pay | Admitting: Internal Medicine

## 2010-05-29 ENCOUNTER — Ambulatory Visit: Payer: Self-pay | Admitting: Internal Medicine

## 2010-06-06 ENCOUNTER — Ambulatory Visit: Payer: Self-pay | Admitting: Internal Medicine

## 2010-06-13 ENCOUNTER — Ambulatory Visit: Payer: Self-pay | Admitting: Internal Medicine

## 2010-06-19 ENCOUNTER — Ambulatory Visit: Payer: Self-pay | Admitting: Internal Medicine

## 2010-06-26 ENCOUNTER — Ambulatory Visit: Payer: Self-pay | Admitting: Internal Medicine

## 2010-06-28 ENCOUNTER — Encounter: Payer: Self-pay | Admitting: Endocrinology

## 2010-07-03 ENCOUNTER — Ambulatory Visit: Payer: Self-pay | Admitting: Internal Medicine

## 2010-07-10 ENCOUNTER — Ambulatory Visit: Payer: Self-pay | Admitting: Internal Medicine

## 2010-07-17 ENCOUNTER — Ambulatory Visit: Payer: Self-pay | Admitting: Internal Medicine

## 2010-07-17 ENCOUNTER — Telehealth (INDEPENDENT_AMBULATORY_CARE_PROVIDER_SITE_OTHER): Payer: Self-pay | Admitting: *Deleted

## 2010-07-25 ENCOUNTER — Ambulatory Visit: Payer: Self-pay | Admitting: Internal Medicine

## 2010-08-01 ENCOUNTER — Ambulatory Visit: Payer: Self-pay | Admitting: Internal Medicine

## 2010-08-07 ENCOUNTER — Ambulatory Visit: Payer: Self-pay | Admitting: Internal Medicine

## 2010-08-17 ENCOUNTER — Ambulatory Visit: Payer: Self-pay | Admitting: Internal Medicine

## 2010-08-23 ENCOUNTER — Ambulatory Visit: Payer: Self-pay | Admitting: Internal Medicine

## 2010-08-28 ENCOUNTER — Telehealth: Payer: Self-pay | Admitting: Endocrinology

## 2010-08-28 ENCOUNTER — Ambulatory Visit: Payer: Self-pay | Admitting: Internal Medicine

## 2010-09-05 ENCOUNTER — Ambulatory Visit
Admission: RE | Admit: 2010-09-05 | Discharge: 2010-09-05 | Payer: Self-pay | Source: Home / Self Care | Attending: Internal Medicine | Admitting: Internal Medicine

## 2010-09-05 ENCOUNTER — Encounter
Admission: RE | Admit: 2010-09-05 | Discharge: 2010-09-05 | Payer: Self-pay | Source: Home / Self Care | Attending: Obstetrics and Gynecology | Admitting: Obstetrics and Gynecology

## 2010-09-05 LAB — HM MAMMOGRAPHY: HM Mammogram: NORMAL

## 2010-09-07 ENCOUNTER — Encounter: Payer: Self-pay | Admitting: Internal Medicine

## 2010-09-07 DIAGNOSIS — I7300 Raynaud's syndrome without gangrene: Secondary | ICD-10-CM | POA: Insufficient documentation

## 2010-09-07 HISTORY — DX: Raynaud's syndrome without gangrene: I73.00

## 2010-09-08 ENCOUNTER — Ambulatory Visit: Payer: Self-pay | Admitting: Internal Medicine

## 2010-09-08 ENCOUNTER — Telehealth (INDEPENDENT_AMBULATORY_CARE_PROVIDER_SITE_OTHER): Payer: Self-pay | Admitting: *Deleted

## 2010-09-23 ENCOUNTER — Ambulatory Visit: Payer: Self-pay | Admitting: Internal Medicine

## 2010-09-27 ENCOUNTER — Ambulatory Visit: Payer: Self-pay | Admitting: Internal Medicine

## 2010-09-28 ENCOUNTER — Ambulatory Visit: Payer: Self-pay | Admitting: Internal Medicine

## 2010-10-01 ENCOUNTER — Encounter: Payer: Self-pay | Admitting: Obstetrics and Gynecology

## 2010-10-01 ENCOUNTER — Encounter: Payer: Self-pay | Admitting: Endocrinology

## 2010-10-01 ENCOUNTER — Encounter: Payer: Self-pay | Admitting: Gastroenterology

## 2010-10-09 ENCOUNTER — Ambulatory Visit: Payer: Self-pay | Admitting: Internal Medicine

## 2010-10-10 NOTE — Assessment & Plan Note (Signed)
Summary: EARS ARE STOPPED UP/CONGESTION/NWS $50   Vital Signs:  Patient Profile:   65 Years Old Female Weight:      139.4 pounds Temp:     98.1 degrees F oral Pulse rate:   76 / minute BP sitting:   122 / 75  (left arm) Cuff size:   regular  Pt. in pain?   no  Vitals Entered By: Orlan Leavens (March 22, 2008 1:39 PM)                  Chief Complaint:  cough/nasal congestion/(r) earache.  History of Present Illness: 5d right earache, rhinorrhea, and nasal congestion    Current Allergies: ! CEFTIN ! SULFA  Past Medical History:    Reviewed history from 04/14/2007 and no changes required:       Asthma       Colonic polyps, hx of       GERD       Osteoporosis       Hiatal hernia       Degenerative joint disease       Episcleritis       Simple goiter       Urolithiasis       Fibromyalgia       dyslipidemia       Hyperglycemia       Seborrhea     Review of Systems  The patient denies fever.     Physical Exam  General:     normal appearance.   Ears:     both tm's are red, right > left Mouth:     no lesion of the oral mucosa.  no erythema.  mucous membranes are moist  Lungs:     clear to auscultation.  no respiratory distress     Impression & Recommendations:  Problem # 1:  URI (ICD-465.9)  Medications Added to Medication List This Visit: 1)  Vibramycin 100 Mg Caps (Doxycycline hyclate) .... Bid   Patient Instructions: 1)  doxyxyclline 100 bid   Prescriptions: VIBRAMYCIN 100 MG  CAPS (DOXYCYCLINE HYCLATE) bid  #14 x 0   Entered and Authorized by:   Minus Breeding MD   Signed by:   Minus Breeding MD on 03/22/2008   Method used:   Electronically sent to ...       [facility] [address]       [address]       [place], [place]  [address]       Ph: [phone]       Fax: [phone]   RxID:   [id number]  ]

## 2010-10-10 NOTE — Miscellaneous (Signed)
Summary: Orders Update pft charges  Clinical Lists Changes  Orders: Added new Service order of Carbon Monoxide diffusing w/capacity (94720) - Signed Added new Service order of Lung Volumes (94240) - Signed Added new Service order of Spirometry (Pre & Post) (94060) - Signed 

## 2010-10-10 NOTE — Progress Notes (Signed)
Summary: allergies-LMTCB x 1   Phone Note Call from Patient Call back at Home Phone [phone]   Caller: Patient Call For: Agness Sibrian Summary of Call:  re: allergies.  pt states she forgot to mention at last visit that she is allergic to: pine trees, grass, dust, mildew and mold. also says that pollen is "really rough on her".  Initial call taken by: Tivis Ringer, CNA,  November 28, 2009 2:13 PM  Follow-up for Phone Call        ATC X 2 and line was busy Vernie Murders  November 28, 2009 2:17 PM  LMOMTCB Vernie Murders  November 28, 2009 2:30 PM  Pt states she had a skin test "many years ago" and was told she was allergic to pine trees, grass, dust, mildew and mold, and that pollen is rough on her. She states she forgot to mention this to CY at her allergy consult. She is scheduled for skin test with CY in may. Carron Curie CMA  November 30, 2009 2:34 PM   Additional Follow-up for Phone Call Additional follow up Details #1::        Noted Additional Follow-up by: Waymon Budge MD,  December 01, 2009 9:11 AM

## 2010-10-10 NOTE — Consult Note (Signed)
Summary: [facility]  [facility]   Imported By: Esmeralda Links D'jimraou 10/20/2007 13:04:33  _____________________________________________________________________  External Attachment:    Type:   Image     Comment:   External Document

## 2010-10-10 NOTE — Letter (Signed)
[facility]   Imported By: Lester Russell 12/16/2008 08:38:02  _____________________________________________________________________  External Attachment:    Type:   Image     Comment:   External Document

## 2010-10-10 NOTE — Progress Notes (Signed)
  Call back at Home Phone [phone]   Caller: [phone] Call For: Minus Breeding MD Summary of Call: per Adalberto Ill call want to know if she can be seen again today ,was here yesterday c/o hurting in her ribs  Initial call taken by: Shelbie Proctor,  August 26, 2008 1:58 PM  Follow-up for Phone Call        ov now Follow-up by: Minus Breeding MD,  August 26, 2008 2:06 PM    Additional Follow-up for Phone Call Additional follow up Details #2::    called pt to inform cherly scheduled pt for this afternoon ,pt made aware Follow-up by: Shelbie Proctor,  August 26, 2008 2:14 PM

## 2010-10-10 NOTE — Progress Notes (Signed)
Summary: RESULTS  Phone Note Call from Patient Call back at Home Phone [phone] Call back at [phone]   Caller: Patient Summary of Call: needs lab results from 05/10/08, also wants copy mailed to her along with done density results Initial call taken by: Migdalia Dk,  June 14, 2008 10:48 AM  Follow-up for Phone Call        paper chart please  Follow-up by: Minus Breeding MD,  June 14, 2008 12:48 PM  Additional Follow-up for Phone Call Additional follow up Details #1::        i called pt 06/14/08 mild anemia.  please come in for cbc, iron panel b-12 285.9,  and bmet 276.0  Additional Follow-up by: Minus Breeding MD,  June 14, 2008 5:18 PM

## 2010-10-10 NOTE — Progress Notes (Signed)
Summary: waiting on rx/ itching/ other questions-await call back  Phone Note Call from Patient Call back at Home Phone [phone]   Caller: Patient Call For: Dayleen Beske Summary of Call: pt had ALT weds. took benadryl same night. is still itching "a lot". please advise what she can take: benadryl vs. zyrtec. ALSO wants to know what has been found out re: shots/ when/where she can begin getting these. ALSO says that [facility] to [facility] [address] to pick up her epipen but it has not been called in per pharmacy.  Initial call taken by: Tivis Ringer, CNA,  Jan 13, 2010 10:02 AM  Follow-up for Phone Call        Please advise about meds and allergy shots.  I will send epipen rx to pharmacy. Abigail Miyamoto RN  Jan 13, 2010 10:25 AM   Additional Follow-up for Phone Call Additional follow up Details #1::        Please call [facility] and see if they would be willing to give this [place] resident her allergy shots for [place], so she doesn't have to drive over here each time. I will still see her, and Dr Everardo All is her primary unless she wants to change out there.  She would be building by our protocol, getting shots twice weekly for 4 months, then once weekly. She would have Epipen. Our allergy lab can discuss the build-up and be available for questions. It would help Mrs Arguijo if they can do this. Thanks. Additional Follow-up by: Waymon Budge MD,  Jan 13, 2010 11:08 AM    Additional Follow-up for Phone Call Additional follow up Details #2::    Called [facility] - spoke with [facility].  Informed her of above statement per CY.  She states this will be a Engineer, site.  Per Geraldine Contras, she will talk with managers regarding this and call office back.  Gweneth Dimitri RN  Jan 13, 2010 12:12 PM    OK- Waymon Budge MD  Jan 13, 2010 1:05 PM  spoke with [facility] at [facility] [phone] she is Print production planner. She states she is waiting to discuss with the doctor that the pt has seen there inthe past  if he is comfortable with doing the allergy shots. She is not sure if it will be possible unless the doctor there is the pt primary doctor, according to our records PMD is Dr. Everardo All. Jamesetta So states she will discuss with the doctor and call back with answer. Carron Curie CMA  Jan 17, 2010 9:16 AM    Prescriptions: EPIPEN 0.3 MG/0.3ML DEVI (EPINEPHRINE) For severe allergic reaction  #1 x 11   Entered by:   Vernie Murders   Authorized by:   Waymon Budge MD   Signed by:   Vernie Murders on 01/16/2010   Method used:   Electronically to        [facility] (retail)       [address], [address]       [place], [place]  [address]       Ph: [phone]       Fax: [phone]   RxID:   [id number]   Appended Document: waiting on rx/ itching/ other questions-await call back Jamesetta So called back and states that they willnot be able to administer pt allergy shots. SH estates if you have any questions you can reach her at [phone].

## 2010-10-10 NOTE — Progress Notes (Signed)
Summary: requesting ov   Phone Note Call from Patient Call back at Home Phone [phone]   Caller: Patient Reason for Call: Acute Illness Details for Reason: requesting ov today Summary of Call: c/o sinus problems, congestion have laryngitis want to be seen today.Patient's chart has been requested.  Initial call taken by: Shelbie Proctor,  July 07, 2007 8:47 AM  Follow-up for Phone Call        ov now Follow-up by: Minus Breeding MD,  July 07, 2007 12:49 PM  Additional Follow-up for Phone Call Additional follow up Details #1::        pt already was seen with her allgery. spoke with pt  07-08-07 Additional Follow-up by: Shelbie Proctor,  July 08, 2007 9:19 AM

## 2010-10-10 NOTE — Progress Notes (Signed)
Summary: WRITTEN RX FOR ZYRTEC  Phone Note Call from Patient Call back at Home Phone [phone]   Caller: Patient Call For: YOUNG Summary of Call: NEEDS 90 DAY RX FOR GENERIC ZYRTEC MAIL TO THE PATIENT Initial call taken by: Lacinda Axon,  May 18, 2010 12:20 PM  Follow-up for Phone Call        Spoke with pt.  She is requesting rx for generic zyrtec be mailed to her.  Rx printed and on CDY's cart to be signed.  Follow-up by: Vernie Murders,  May 18, 2010 2:29 PM  Additional Follow-up for Phone Call Additional follow up Details #1::        Left message at given number that phone call complete.Reynaldo Minium CMA  May 19, 2010 11:22 AM     New/Updated Medications: CETIRIZINE HCL 10 MG TABS (CETIRIZINE HCL) 1 by mouth once daily Prescriptions: CETIRIZINE HCL 10 MG TABS (CETIRIZINE HCL) 1 by mouth once daily  #90 x 3   Entered by:   Vernie Murders   Authorized by:   Waymon Budge MD   Signed by:   Vernie Murders on 05/18/2010   Method used:   Print then Give to Patient   RxID:   [id number]

## 2010-10-10 NOTE — Miscellaneous (Signed)
Summary: BONE DENSTIY  Clinical Lists Changes  Orders: Added new Test order of T-Lumbar Vertebral Assessment [phone]) - Signed

## 2010-10-10 NOTE — Assessment & Plan Note (Signed)
Summary: flu shot/mh  Nurse Visit   Allergies: 1)  ! Ceftin 2)  ! Sulfa  Orders Added: 1)  Admin 1st Vaccine [90471] 2)  Flu Vaccine 43yrs + [[address]]  Flu Vaccine Consent Questions     Do you have a history of severe allergic reactions to this vaccine? no    Any prior history of allergic reactions to egg and/or gelatin? no    Do you have a sensitivity to the preservative Thimersol? no    Do you have a past history of Guillan-Barre Syndrome? no    Do you currently have an acute febrile illness? no    Have you ever had a severe reaction to latex? no    Vaccine information given and explained to patient? yes    Are you currently pregnant? no    Lot Number:[id number]   Exp Date:03/10/2011   Site Given  Left Deltoid IM Clarise Cruz [facility])  May 29, 2010 9:52 AM   es-CCC]  .lbflu

## 2010-10-10 NOTE — Progress Notes (Signed)
Summary: antibotic not helping  Medications Added AVELOX 400 MG  TABS (MOXIFLOXACIN HCL) 1 by mouth once daily       Phone Note Call from Patient Call back at Home Phone [phone] Call back at [phone]   Caller: Patient Details for Reason: antibotic not helping Summary of Call: per pt call antibotic is not helping that dr Jonny Ruiz prescribe friday the cefuromime . per pt leaving for [place] on wednesday . need to get better or will have to postphone  herTRIP. can dr Jonny Ruiz presribe something else for her to try. ([facility] [phone]).Patient's chart has been requested.  Initial call taken by: Shelbie Proctor,  July 07, 2007 9:17 AM  Follow-up for Phone Call        ok to change to avelox 400 once daily for 10 days - see hardcopy Follow-up by: Corwin Levins MD,  July 07, 2007 2:31 PM    New/Updated Medications: AVELOX 400 MG  TABS (MOXIFLOXACIN HCL) 1 by mouth once daily   Prescriptions: AVELOX 400 MG  TABS (MOXIFLOXACIN HCL) 1 by mouth once daily  #10 x 0   Entered and Authorized by:   Corwin Levins MD   Signed by:   Corwin Levins MD on 07/07/2007   Method used:   Print then Give to Patient   RxID:   [id number]     Appended Document: antibotic not helping wrong pt

## 2010-10-10 NOTE — Assessment & Plan Note (Signed)
Summary: sick visit/sinus infection?/ears hurt/mg   Primary Provider/Referring Provider:  Minus Breeding MD  CC:  Nasal pressure and headaches with both ears stopped up.Marland Kitchen  History of Present Illness: November 23, 2009-  63 yoF self-referred asking management of allergies and asthma. She had seen Dr Alwyn Ren and Dr Beaulah Dinning for these in the past. Her brother is a patient here and she decided toswitch because it was time for allergy re-test. Complains of postnasal driop, sneeze and itch For the past 3 years she has had intervals of itching eyes, headache, retroorbital pressure, sorethroats and laryngitis. In some years October throught the winterhas been worst.She has been on allergy vaccine years ago with benefit. Has needed prednisone for winter bronchitis. Hx nasal polypectomy and septoplasty- Dr Jearld Fenton. Had pneumonia with sinusitis in 2 Had flu vax but never pneumovax. Hiatal hernia withh GERD  December 13, 2009- Allergic rhinitis, asthma Within past week has had gradual increasing head congestion, stoped up ears, laryngitis. Using her meds and Neti pot but little comes out. Gets itching eyes and her eye doctor told her it was allergy. Chest is mostly clear- used rescue inhaler yesterday. She is scheduled for PFT and Skin test May 4. IgE- 7.9 Allergy panel specific IgE- Neg    Current Medications (verified): 1)  Prevacid 30 Mg  Cpdr (Lansoprazole) .... Take 1 By Mouth Qd 2)  Singulair 10 Mg  Tabs (Montelukast Sodium) .... Take 1 By Mouth Qd 3)  Albuterol 90 Mcg/act  Aers (Albuterol) .... Use Prn 4)  Asmanex 120 Metered Doses 220 Mcg/inh Aepb (Mometasone Furoate) .Marland Kitchen.. 1 Puff Qd 5)  Astelin 137 Mcg/spray Soln (Azelastine Hcl) .... 2 Sprays Each Side Qd 6)  Nasacort Aq 55 Mcg/act Aers (Triamcinolone Acetonide(Nasal)) .... 2 Sprays in Each Nostril Once Daily 7)  Zyrtec Allergy 10 Mg Tabs (Cetirizine Hcl) .... Take 1 By Mouth Qd 8)  Hyzaar 100-12.5 Mg  Tabs (Losartan Potassium-Hctz) .... Qd 9)   Mevacor 20 Mg  Tabs (Lovastatin) .... Qhs 10)  Calcium 1200 Mg Tabs (Calcium Carbonate) .... Take Qd 11)  Vitamin D 1000 Unit Tabs (Cholecalciferol) .... Take 1 By Mouth Qd 12)  Probiotic  Caps (Misc Intestinal Flora Regulat) .... Take 1 By Mouth Qd 13)  Vitamin B-12 1000 Mcg Tabs (Cyanocobalamin) .... Take 1 By Mouth Qd 14)  Vitamin C 500 Mg Tabs (Ascorbic Acid) .... Take 1 By Mouth Qd 15)  Lumigan 16)  Proventil Hfa 108 (90 Base) Mcg/act Aers (Albuterol Sulfate) .... As Needed  Allergies (verified): 1)  ! Ceftin 2)  ! Sulfa  Past History:  Past Medical History: Last updated: 11/28/2009 Asthma Episcleritis Simple goiter UTI (ICD-599.0) HYPERCHOLESTEROLEMIA (ICD-272.0) HYPERTENSION (ICD-401.9) SINUSITIS (ICD-473.9) ALLERGIC RHINITIS (ICD-477.9) INSOMNIA (ICD-780.52) ASYMPTOMATIC POSTMENOPAUSAL STATUS (ICD-V49.81) FIBROIDS, UTERUS (ICD-218.9) ENCOUNTER FOR LONG-TERM USE OF OTHER MEDICATIONS (ICD-V58.69) GLAUCOMA (ICD-365.9) ARM PAIN, RIGHT (ICD-729.5) OSTEOPOROSIS (ICD-733.00) GERD (ICD-530.81) COLONIC POLYPS, HX OF (ICD-V12.72) ASTHMA (ICD-493.90)  Past Surgical History: Last updated: 11/23/2009 Hepatitis age 70 Neck Injury (1983) Stress Cardiolite (02/13/2002) EKG (12/04/2006 Nasal polypectomy and septoplasty colon polypectomy laparoscopy- gyn  Family History: Last updated: 11/23/2009 patient states she is concerned because her mother died from                           polymyositis. 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    Vital  Signs:  Patient profile:   65 year old female Height:      63 inches Weight:      141 pounds BMI:     25.07 O2 Sat:      99 % on Room air Pulse rate:   72 / minute BP sitting:   116 / 74  (left arm) Cuff size:   regular  Vitals Entered By: Reynaldo Minium CMA (December 13, 2009 2:57 PM)  O2 Flow:  Room air  Physical Exam  Additional Exam:  General: A/Ox3; pleasant and cooperative, NAD,  SKIN: plae birthmark left cheek NODES: no lymphadenopathy HEENT: [place]/AT,  Periorbital edema, EOM- WNL, Conjuctivae- clear, PERRLA, TM-right canal redL, Nose- turbinate edema, Throat- clear and wnl NECK: Supple w/ fair ROM, JVD- none, normal carotid impulses w/o bruits Thyroid- normal to palpation, hoarse without stridor CHEST: Clear to P&A HEART: RRR, no m/g/r heard ABDOMEN: Soft and nl;  EAV:WUJW, nl pulses, no edema  NEURO: Grossly intact to observation      Impression & Recommendations:  Problem # 1:  ALLERGIC RHINITIS (ICD-477.9)  Exacerbation consistent with allergic rhinitis. We may need to update CT of sinuses to exclude chronic sinusitis. Her previous study had been done at ENT office years ago.We discussed antihistamines and decongestants. Her updated medication list for this problem includes:    Astelin 137 Mcg/spray Soln (Azelastine hcl) .Marland Kitchen... 2 sprays each side qd    Nasacort Aq 55 Mcg/act Aers (Triamcinolone acetonide(nasal)) .Marland Kitchen... 2 sprays in each nostril once daily    Zyrtec Allergy 10 Mg Tabs (Cetirizine hcl) .Marland Kitchen... Take 1 by mouth qd  Medications Added to Medication List This Visit: 1)  Nasacort Aq 55 Mcg/act Aers (Triamcinolone acetonide(nasal)) .... 2 sprays in each nostril once daily  Other Orders: Est. Patient Level III (11914) Admin of Therapeutic Inj  intramuscular or subcutaneous (78295) Depo- Medrol 80mg  (J1040) Nebulizer Tx (62130)  Patient Instructions: 1)  Keep scheduled appointment 2)  Try otc decongestant Sudafed-PE 3)  neb neo nasal  4)  depo  80   Medication Administration  Injection # 1:    Medication: Depo- Medrol 80mg     Diagnosis: ALLERGIC RHINITIS (ICD-477.9)    Route: SQ    Site: RUOQ gluteus    Exp Date: 07/2012    Lot #: [id number]    Mfr: Pharmacia    Patient tolerated injection without complications    Given by: Reynaldo Minium CMA (December 13, 2009 3:26 PM)  Medication # 1:    Medication: EMR miscellaneous medications    Diagnosis: ALLERGIC RHINITIS (ICD-477.9)    Dose: 3drops    Route: intranasal    Exp Date: 01/2011    Lot #: [id number]    Mfr: Bayer    Comments: Neo-Synephrine.    Patient tolerated medication without complications    Given by: Reynaldo Minium CMA (December 13, 2009 3:27 PM)  Orders Added: 1)  Est. Patient Level III [96295] 2)  Admin of Therapeutic Inj  intramuscular or subcutaneous [96372] 3)  Depo- Medrol 80mg  [J1040] 4)  Nebulizer Tx [28413]

## 2010-10-10 NOTE — Miscellaneous (Signed)
Summary: Injection Record / [facility]    Injection Record / [facility]    Imported By: Lennie Odor 05/12/2010 12:14:43  _____________________________________________________________________  External Attachment:    Type:   Image     Comment:   External Document

## 2010-10-10 NOTE — Assessment & Plan Note (Signed)
Summary: ELEV BLOOD PRESSURE/SAE/CD   Vital Signs:  Patient profile:   65 year old female Height:      63 inches (160.02 cm) Weight:      141 pounds (64.09 kg) BMI:     25.07 O2 Sat:      97 % on Room air Temp:     97.5 degrees F (36.39 degrees C) oral Pulse rate:   67 / minute BP sitting:   128 / 70  (left arm) Cuff size:   regular  Vitals Entered By: Brenton Grills MA (March 20, 2010 1:56 PM)  O2 Flow:  Room air CC: Elevated BP/pt also states she is currently taking Vitamin B12 sublingually 1 by mouth qd/aj   Primary Provider:  Minus Breeding MD  CC:  Elevated BP/pt also states she is currently taking Vitamin B12 sublingually 1 by mouth qd/aj.  History of Present Illness: pt says systolic bp at home has been elevated (170/66).  she takes hyzaar as rx'ed.  she recently took prednisone for allergic sxs.    Current Medications (verified): 1)  Lansoprazole 30 Mg Cpdr (Lansoprazole) .Marland Kitchen.. 1 By Mouth Two Times A Day 2)  Singulair 10 Mg  Tabs (Montelukast Sodium) .... Take 1 By Mouth Qd 3)  Asmanex 120 Metered Doses 220 Mcg/inh Aepb (Mometasone Furoate) .Marland Kitchen.. 1 Puff Qd 4)  Astelin 137 Mcg/spray Soln (Azelastine Hcl) .... 2 Sprays Each Side Qd 5)  Nasacort Aq 55 Mcg/act Aers (Triamcinolone Acetonide(Nasal)) .... 2 Sprays in Each Nostril Once Daily 6)  Zyrtec Allergy 10 Mg Tabs (Cetirizine Hcl) .... Take 1 By Mouth Qd 7)  Hyzaar 100-12.5 Mg  Tabs (Losartan Potassium-Hctz) .... Qd 8)  Mevacor 20 Mg  Tabs (Lovastatin) .... Qhs 9)  Calcium 1200 Mg Tabs (Calcium Carbonate) .... Take Qd 10)  Vitamin D 1000 Unit Tabs (Cholecalciferol) .... Take 1 By Mouth Qd 11)  Vitamin B-12 1000 Mcg Subl (Cyanocobalamin) .... Take 1 By Mouth Once Daily 12)  Vitamin C 500 Mg Tabs (Ascorbic Acid) .... Take 1 By Mouth Qd 13)  Lumigan 14)  Proventil Hfa 108 (90 Base) Mcg/act Aers (Albuterol Sulfate) .... As Needed 15)  Align  Caps (Probiotic Product) .... Once Daily 16)  Multivitamins  Tabs  (Multiple Vitamin) .... Once Daily 17)  Epipen 0.3 Mg/0.58ml Devi (Epinephrine) .... For Severe Allergic Reaction 18)  Allergy Vaccine Gh New Start 19)  Prednisone 10 Mg Tabs (Prednisone) .... Take 4 X 2 Days, 3 X 2 Days, 2 X 2 Days, 1 X 2 Days, Then Stop 20)  Garlique 400 Mg Tbec (Garlic) .Marland Kitchen.. 1 By Mouth Once Daily 21)  Zaditor 0.025 % Soln (Ketotifen Fumarate) .Marland Kitchen.. 1 Drop in Each Eye Two Times A Day As Needed  Allergies (verified): 1)  ! Ceftin 2)  ! Sulfa  Past History:  Past Medical History: Last updated: 01/11/2010 Asthma Episcleritis Simple goiter HYPERCHOLESTEROLEMIA (ICD-272.0) HYPERTENSION (ICD-401.9) SINUSITIS (ICD-473.9) ALLERGIC RHINITIS (ICD-477.9)- Skin test 01/11/10 INSOMNIA (ICD-780.52) FIBROIDS, UTERUS (ICD-218.9) GLAUCOMA (ICD-365.9) OSTEOPOROSIS (ICD-733.00) GERD (ICD-530.81) COLONIC POLYPS, HX OF (ICD-V12.72) ASTHMA (ICD-493.90)  Review of Systems       she has headache.  Physical Exam  General:  normal appearance.   Lungs:  Clear to auscultation bilaterally. Normal respiratory effort.  Heart:  Regular rate and rhythm without murmurs or gallops noted. Normal S1,S2.   Extremities:  no edema Psych:  Alert and cooperative; normal mood and affect; normal attention span and concentration.     Impression & Recommendations:  Problem # 1:  HYPERTENSION (ICD-401.9) well-controlled  Medications Added to Medication List This Visit: 1)  Garlique 400 Mg Tbec (Garlic) .Marland Kitchen.. 1 by mouth once daily 2)  Zaditor 0.025 % Soln (Ketotifen fumarate) .Marland Kitchen.. 1 drop in each eye two times a day as needed  Other Orders: Est. Patient Level III ([address])  Patient Instructions: 1)  pleaase continue same medication. 2)  return next year.

## 2010-10-10 NOTE — Assessment & Plan Note (Signed)
Summary: COUGH/CHEST CONGEST/LARYNGITIS-$50---STC   Vital Signs:  Patient Profile:   65 Years Old Female Weight:      139.2 pounds O2 Sat:      97 % O2 treatment:    Room Air Temp:     98.0 degrees F oral Pulse rate:   74 / minute BP sitting:   132 / 58  (left arm) Cuff size:   regular  Pt. in pain?   no  Vitals Entered By: Orlan Leavens (July 15, 2008 2:22 PM)                  Chief Complaint:  COUGH/ CHEST CONGESTION/ LARYNGITIS.  History of Present Illness: 3 weeks of nasal congestion, rhinorrhea, bilat otalgia, and dry-quality cough.      Prior Medications Reviewed Using: Patient Recall  Updated Prior Medication List: CALTRATE 600+D 600-400 MG-UNIT  TABS (CALCIUM CARBONATE-VITAMIN D) take 1 by mouth qd PREVACID 30 MG  CPDR (LANSOPRAZOLE) take 1 by mouth qd SINGULAIR 10 MG  TABS (MONTELUKAST SODIUM) take 1 by mouth qd COD LIVER OIL   CAPS (COD LIVER OIL) take 1 by mouth qd XYZAL 5 MG  TABS (LEVOCETIRIZINE DIHYDROCHLORIDE) once daily ALBUTEROL 90 MCG/ACT  AERS (ALBUTEROL) use prn TRAVATAN 0.004 %  SOLN (TRAVOPROST) USE 1 DROP IN EACH EYE QD AZMACORT 75 MCG/ACT  AERS (TRIAMCINOLONE ACETONIDE) USE PRN HYZAAR 100-12.5 MG  TABS (LOSARTAN POTASSIUM-HCTZ) qd MEVACOR 20 MG  TABS (LOVASTATIN) qhs NORVASC 2.5 MG  TABS (AMLODIPINE BESYLATE) qd TRAZODONE HCL 100 MG TABS (TRAZODONE HCL) qhs  Current Allergies (reviewed today): ! CEFTIN ! SULFA  Past Medical History:    Reviewed history from 04/14/2007 and no changes required:       Asthma       Colonic polyps, hx of       GERD       Osteoporosis       Hiatal hernia       Degenerative joint disease       Episcleritis       Simple goiter       Urolithiasis       Fibromyalgia       dyslipidemia       Hyperglycemia       Seborrhea     Review of Systems  The patient denies fever.     Physical Exam  General:     well developed, well nourished, in no acute distress Head:     head is normocephalic eyes:  no scleral icterus no periorbital swelling perrl external ears are normal nose normal externally mouth has no lesion, including normal tongue  Ears:     right tm is very red, left is normal Lungs:     clear to auscultation.  no respiratory distress     Impression & Recommendations:  Problem # 1:  URI (ICD-465.9)  Medications Added to Medication List This Visit: 1)  Levaquin 500 Mg Tabs (Levofloxacin) .... Qd   Patient Instructions: 1)  levaquin 500/day x 5d   Prescriptions: LEVAQUIN 500 MG TABS (LEVOFLOXACIN) qd  #5 x 0   Entered and Authorized by:   Minus Breeding MD   Signed by:   Minus Breeding MD on 07/15/2008   Method used:   Electronically to        [facility] [address] (retail)       [address]       [place]  [place], [place]  [address]       Ph: [phone]       Fax: [phone]   RxID:   [id number]  ]

## 2010-10-10 NOTE — Progress Notes (Signed)
  Phone Note Call from Patient Call back at Home Phone [phone]   Caller: Patient Call For: dr Everardo All Summary of Call: per pt call finished antibotic and finished her steroids 07-18-07 is okay for her to get the  pneumonia and flu shot when is best time to get the pnemonia shot just finishing the steroid is there a waiting  period Patient's chart has been requested.  Initial call taken by: Shelbie Proctor,  July 28, 2007 11:21 AM  Follow-up for Phone Call        no need to wait Follow-up by: Minus Breeding MD,  July 28, 2007 12:29 PM  Additional Follow-up for Phone Call Additional follow up Details #1::        Phone Call Completed, pt  Notified  spoke with pt  made aware Additional Follow-up by: Shelbie Proctor,  July 28, 2007 1:09 PM

## 2010-10-10 NOTE — Assessment & Plan Note (Signed)
Summary: ALLERGIES OR SINUS/ KNOT ON NECK/NWS   Vital Signs:  Patient profile:   65 year old female Height:      63 inches (160.02 cm) Weight:      141.0 pounds (64.09 kg) O2 Sat:      98 % Temp:     98.5 degrees F (36.94 degrees C) oral Pulse rate:   74 / minute BP sitting:   112 / 70  (left arm) Cuff size:   regular  Vitals Entered By: Orlan Leavens (May 25, 2009 10:19 AM) CC: Ongoing allergies/ concern about hard place on side of neck/ also been having burning when urinate Is Patient Diabetic? No Pain Assessment Patient in pain? no        Primary Care Provider:  Minus Breeding MD  CC:  Ongoing allergies/ concern about hard place on side of neck/ also been having burning when urinate.  History of Present Illness: multiple concerns -  burning when urinated onset 3 weeks ago contstant symptoms - small volume and frq voiding - no hematuria no suprapubic or flank pain no fever +vaginal itch with urination - no vag discharge noted  has tender "spot/knot" on back of neck located to Right of spine small firm "knot" - not changing in size onset/1st noted symptoms 1 month ago no rah or drainage no other affected areas on neck, face, back, extremties or scalp  also with sinus and allergy flare ear pain R>L x 3 weeks - no drainage or hearing change using nasal steroids and allg meds as directed +sinus pressure frontal>maxillary -  swollen "inside nose" - little but thick nasal discharge - yellow,clear  Current Medications (verified): 1)  Prevacid 30 Mg  Cpdr (Lansoprazole) .... Take 1 By Mouth Qd 2)  Singulair 10 Mg  Tabs (Montelukast Sodium) .... Take 1 By Mouth Qd 3)  Albuterol 90 Mcg/act  Aers (Albuterol) .... Use Prn 4)  Hyzaar 100-12.5 Mg  Tabs (Losartan Potassium-Hctz) .... Qd 5)  Mevacor 20 Mg  Tabs (Lovastatin) .... Qhs 6)  Norvasc 2.5 Mg  Tabs (Amlodipine Besylate) .... Qd 7)  Astelin 137 Mcg/spray Soln (Azelastine Hcl) .... 2 Sprays Each Side Qd 8)   Asmanex 120 Metered Doses 220 Mcg/inh Aepb (Mometasone Furoate) .Marland Kitchen.. 1 Puff Qd 9)  Calcium 1200 Mg Tabs (Calcium Carbonate) .... Take Qd 10)  Vitamin D 1000 Unit Tabs (Cholecalciferol) .... Take 1 By Mouth Qd 11)  Probiotic  Caps (Misc Intestinal Flora Regulat) .... Take 1 By Mouth Qd 12)  Vitamin B-12 1000 Mcg Tabs (Cyanocobalamin) .... Take 1 By Mouth Qd 13)  Zyrtec Allergy 10 Mg Tabs (Cetirizine Hcl) .... Take 1 By Mouth Qd 14)  Nasonex 50 Mcg/act Susp (Mometasone Furoate) .Marland Kitchen.. 1 Spray Each Nostril Each Day 15)  Vitamin C 500 Mg Tabs (Ascorbic Acid) .... Take 1 By Mouth Qd 16)  Nasacort Aq 55 Mcg/act Aers (Triamcinolone Acetonide(Nasal)) .... Use As Needed 17)  Xalatan 0.005 % Soln (Latanoprost) .... Use 1 Drop in Each Eye  Allergies (verified): 1)  ! Ceftin 2)  ! Sulfa  Past History:  Past Medical History: Reviewed history from 04/14/2007 and no changes required. Asthma Colonic polyps, hx of GERD Osteoporosis Hiatal hernia Degenerative joint disease Episcleritis Simple goiter Urolithiasis Fibromyalgia dyslipidemia Hyperglycemia Seborrhea  Review of Systems  The patient denies anorexia, weight loss, vision loss, chest pain, syncope, dyspnea on exertion, and headaches.    Physical Exam  General:  alert, well-developed, well-nourished, and cooperative to examination.  Eyes:  vision grossly intact; pupils equal, round and reactive to light.  conjunctiva and lids normal.    Ears:  bilat tms hazy but intact - min effusion R>L - no drainage or canal redness Nose:  nasal discharge, and mucosal erythema.  no airflow obstruction, no nasal polyps, no nasal mucosal lesions, and mucosal edema.   Mouth:  pharyngeal erythema and fair dentition.  clear postnasal drainage appreciated Neck:  No deformities, small sub cm nodule - firm and mobile with mild tenderness noted. Lungs:  normal respiratory effort, no intercostal retractions or use of accessory muscles; normal breath sounds  bilaterally - no crackles and no wheezes.    Heart:  normal rate, regular rhythm, no murmur, and no rub. BLE without edema.    Impression & Recommendations:  Problem # 1:  SINUSITIS (ICD-473.9)  likely cause of ENT problems, ?allergic trigger - given +tender LAD of post cervical chain (neck spot_ will treat with emperic abx - zpack cont allergy treatment and f/u with ENT and allergist as ongoing Her updated medication list for this problem includes:    Astelin 137 Mcg/spray Soln (Azelastine hcl) .Marland Kitchen... 2 sprays each side qd    Nasonex 50 Mcg/act Susp (Mometasone furoate) .Marland Kitchen... 1 spray each nostril each day    Nasacort Aq 55 Mcg/act Aers (Triamcinolone acetonide(nasal)) ..... Use as needed    Azithromycin 250 Mg Tabs (Azithromycin) .Marland Kitchen... 2 tabs by mouth today, then 1 by mouth daily starting tomorrow  Orders: Prescription Created Electronically [id number])  Problem # 2:  UTI (ICD-599.0)  UA unremarkable for bact problems but given "itch" will treat for ?candida if cont problems despite this tx, reeval with repeat UA and Ucx and consider uro eval for other cystitis causes Orders: UA Dipstick w/o Micro (manual) (47425) Prescription Created Electronically [id number])  Her updated medication list for this problem includes:    Azithromycin 250 Mg Tabs (Azithromycin) .Marland Kitchen... 2 tabs by mouth today, then 1 by mouth daily starting tomorrow  Complete Medication List: 1)  Prevacid 30 Mg Cpdr (Lansoprazole) .... Take 1 by mouth qd 2)  Singulair 10 Mg Tabs (Montelukast sodium) .... Take 1 by mouth qd 3)  Albuterol 90 Mcg/act Aers (Albuterol) .... Use prn 4)  Hyzaar 100-12.5 Mg Tabs (Losartan potassium-hctz) .... Qd 5)  Mevacor 20 Mg Tabs (Lovastatin) .... Qhs 6)  Norvasc 2.5 Mg Tabs (Amlodipine besylate) .... Qd 7)  Astelin 137 Mcg/spray Soln (Azelastine hcl) .... 2 sprays each side qd 8)  Asmanex 120 Metered Doses 220 Mcg/inh Aepb (Mometasone furoate) .Marland Kitchen.. 1 puff qd 9)  Calcium 1200 Mg Tabs (calcium  Carbonate)  .... Take qd 10)  Vitamin D 1000 Unit Tabs (Cholecalciferol) .... Take 1 by mouth qd 11)  Probiotic Caps (Misc intestinal flora regulat) .... Take 1 by mouth qd 12)  Vitamin B-12 1000 Mcg Tabs (Cyanocobalamin) .... Take 1 by mouth qd 13)  Zyrtec Allergy 10 Mg Tabs (Cetirizine hcl) .... Take 1 by mouth qd 14)  Nasonex 50 Mcg/act Susp (Mometasone furoate) .Marland Kitchen.. 1 spray each nostril each day 15)  Vitamin C 500 Mg Tabs (Ascorbic acid) .... Take 1 by mouth qd 16)  Nasacort Aq 55 Mcg/act Aers (Triamcinolone acetonide(nasal)) .... Use as needed 17)  Xalatan 0.005 % Soln (Latanoprost) .... Use 1 drop in each eye 18)  Azithromycin 250 Mg Tabs (Azithromycin) .... 2 tabs by mouth today, then 1 by mouth daily starting tomorrow 19)  Fluconazole 150 Mg Tabs (Fluconazole) .Marland Kitchen.. 1 by mouth once daily x 1  Patient Instructions: 1)  take the Zpack and diflucan for infection/yeast as directed 2)  also ok to use OTC yeast cream treatments if continued itch (use as directed on label) 3)  if continued symptoms, call for re-check 4)  see your dermatologist and ENT/allergy doctor for other questions - 5)  contine all other medications as ongoing Prescriptions: FLUCONAZOLE 150 MG TABS (FLUCONAZOLE) 1 by mouth once daily x 1  #1 x 0   Entered and Authorized by:   Newt Lukes MD   Signed by:   Newt Lukes MD on 05/25/2009   Method used:   Electronically to        [facility] [address] (retail)       [address], [address]       [place], [place]  [address]       Ph: [phone]       Fax: [phone]   RxID:   [id number] AZITHROMYCIN 250 MG TABS (AZITHROMYCIN) 2 tabs by mouth today, then 1 by mouth daily starting tomorrow  #6 x 0   Entered and Authorized by:   Newt Lukes MD   Signed by:   Newt Lukes MD on 05/25/2009   Method used:   Electronically to        [facility] [address] (retail)       [address], [address]        [place], [place]  [address]       Ph: [phone]       Fax: [phone]   RxID:   [id number]   Laboratory Results   Urine Tests    Routine Urinalysis   Color: yellow Appearance: Clear Glucose: negative   (Normal Range: Negative) Bilirubin: negative   (Normal Range: Negative) Ketone: negative   (Normal Range: Negative) Spec. Gravity: <1.005   (Normal Range: 1.003-1.035) Blood: negative   (Normal Range: Negative) pH: 5.0   (Normal Range: 5.0-8.0) Protein: negative   (Normal Range: Negative) Urobilinogen: 0.2   (Normal Range: 0-1) Nitrite: negative   (Normal Range: Negative) Leukocyte Esterace: negative   (Normal Range: Negative)

## 2010-10-10 NOTE — Progress Notes (Signed)
Summary: Triage  Phone Note Call from Patient Call back at Home Phone [phone]   Caller: Patient Call For: Dr. Russella Dar Reason for Call: Talk to Nurse Summary of Call: has appt. sch'd on 05-08-10 requesting sooner appt....burning in abd/esophagus, acid reflux Initial call taken by: Karna Christmas,  April 04, 2010 10:43 AM  Follow-up for Phone Call        patient is rescheduled for 04/06/10 9:45 Follow-up by: Darcey Nora RN, CGRN,  April 04, 2010 10:55 AM

## 2010-10-10 NOTE — Progress Notes (Signed)
Summary: nheard report  Phone Note Call from Patient Call back at Home Phone [phone]   Caller: Patient Call For: dr Everardo All Summary of Call: pt states she already seen a specialist , need to talk to dr Everardo All about this just heard her report. pt states her arm is still giving her problems Initial call taken by: Shelbie Proctor,  October 22, 2007 1:37 PM  Follow-up for Phone Call        called pt 10/22/07.  gave pncv result.  says ortho didn't rx pain med.  i advised pt to take ultracet as rx'ed Follow-up by: Minus Breeding MD,  October 22, 2007 2:13 PM

## 2010-10-10 NOTE — Assessment & Plan Note (Signed)
Summary: cpx-lb   Vital Signs:  Patient profile:   65 year old female Height:      63 inches (160.02 cm) Weight:      139.25 pounds (63.30 kg) BMI:     24.76 O2 Sat:      94 % on Room air Temp:     97.0 degrees F (36.11 degrees C) oral Pulse rate:   86 / minute BP sitting:   118 / 68  (left arm) Cuff size:   regular  Vitals Entered By: Josph Macho RMA (November 28, 2009 2:41 PM)  O2 Flow:  Room air CC: Physical/ pt states she is no longer using Xalatan/ CF   Primary Provider:  Minus Breeding MD  CC:  Physical/ pt states she is no longer using Xalatan/ CF.  History of Present Illness: the status of 3 chronic medical problems is addressed today: htn:  pt takes hyzaar as rx'ed.  she has intermittent slight dizziness. dyslipidemia:  she tolerates mevacor well. gerd:  pt says prevacid works well.  Current Medications (verified): 1)  Prevacid 30 Mg  Cpdr (Lansoprazole) .... Take 1 By Mouth Qd 2)  Singulair 10 Mg  Tabs (Montelukast Sodium) .... Take 1 By Mouth Qd 3)  Albuterol 90 Mcg/act  Aers (Albuterol) .... Use Prn 4)  Asmanex 120 Metered Doses 220 Mcg/inh Aepb (Mometasone Furoate) .Marland Kitchen.. 1 Puff Qd 5)  Astelin 137 Mcg/spray Soln (Azelastine Hcl) .... 2 Sprays Each Side Qd 6)  Nasacort Aq 55 Mcg/act Aers (Triamcinolone Acetonide(Nasal)) .... Use As Needed 7)  Zyrtec Allergy 10 Mg Tabs (Cetirizine Hcl) .... Take 1 By Mouth Qd 8)  Norvasc 2.5 Mg  Tabs (Amlodipine Besylate) .... Qd 9)  Hyzaar 100-12.5 Mg  Tabs (Losartan Potassium-Hctz) .... Qd 10)  Mevacor 20 Mg  Tabs (Lovastatin) .... Qhs 11)  Calcium 1200 Mg Tabs (Calcium Carbonate) .... Take Qd 12)  Vitamin D 1000 Unit Tabs (Cholecalciferol) .... Take 1 By Mouth Qd 13)  Probiotic  Caps (Misc Intestinal Flora Regulat) .... Take 1 By Mouth Qd 14)  Vitamin B-12 1000 Mcg Tabs (Cyanocobalamin) .... Take 1 By Mouth Qd 15)  Vitamin C 500 Mg Tabs (Ascorbic Acid) .... Take 1 By Mouth Qd 16)  Xalatan 0.005 % Soln (Latanoprost) .... Use  1 Drop in Each Eye 17)  Lumigan 18)  Proventil Hfa 108 (90 Base) Mcg/act Aers (Albuterol Sulfate) .... As Needed  Allergies (verified): 1)  ! Ceftin 2)  ! Sulfa  Past History:  Past Medical History: Asthma Episcleritis Simple goiter UTI (ICD-599.0) HYPERCHOLESTEROLEMIA (ICD-272.0) HYPERTENSION (ICD-401.9) SINUSITIS (ICD-473.9) ALLERGIC RHINITIS (ICD-477.9) INSOMNIA (ICD-780.52) ASYMPTOMATIC POSTMENOPAUSAL STATUS (ICD-V49.81) FIBROIDS, UTERUS (ICD-218.9) ENCOUNTER FOR LONG-TERM USE OF OTHER MEDICATIONS (ICD-V58.69) GLAUCOMA (ICD-365.9) ARM PAIN, RIGHT (ICD-729.5) OSTEOPOROSIS (ICD-733.00) GERD (ICD-530.81) COLONIC POLYPS, HX OF (ICD-V12.72) ASTHMA (ICD-493.90)  Review of Systems  The patient denies syncope and dyspnea on exertion.    Physical Exam  General:  normal appearance.   Lungs:  Clear to auscultation bilaterally. Normal respiratory effort.  Heart:  Regular rate and rhythm without murmurs or gallops noted. Normal S1,S2.   Additional Exam:   LDL Cholesterol           51 mg/dL      Impression & Recommendations:  Problem # 1:  HYPERTENSION (ICD-401.9) overcontrolled  Problem # 2:  GERD (ICD-530.81) well-controlled  Problem # 3:  HYPERCHOLESTEROLEMIA (ICD-272.0) well-controlled  Medications Added to Medication List This Visit: 1)  Lumigan  2)  Proventil Hfa 108 (90 Base) Mcg/act  Aers (Albuterol sulfate) .... As needed  Other Orders: Radiology Referral (Radiology) Est. Patient Level IV (40981)  Preventive Care Screening  Last Pneumovax:    Date:  11/23/2009    Results:  given      gyn is dr Elana Alm   Patient Instructions: 1)  stop amlodipine on a trial basis. 2)  blood pressure check in a few weeks. 3)  recheck thyroid ultrasound.  you will be called with a day and time for an appointment. 4)  return 1 year.

## 2010-10-10 NOTE — Progress Notes (Signed)
Summary: rx refill  Phone Note Call from Patient Call back at Home Phone [phone]   Caller: Patient Summary of Call: pt called requesting refills of Hyzaar, lovastatin and amlodipine be mailed to her for her to send to mail order co and also local pharmacy as she will be completely out of meds soon. Rx printed and put on MD's desk for signiture. Initial call taken by: Margaret Pyle, CMA,  September 07, 2009 1:11 PM  Follow-up for Phone Call        i signed Follow-up by: Minus Breeding MD,  September 07, 2009 1:38 PM  Additional Follow-up for Phone Call Additional follow up Details #1::        Rx mailed Additional Follow-up by: Margaret Pyle, CMA,  September 07, 2009 1:51 PM    Prescriptions: NORVASC 2.5 MG  TABS (AMLODIPINE BESYLATE) qd  #90 x 3   Entered by:   Margaret Pyle, CMA   Authorized by:   Minus Breeding MD   Signed by:   Margaret Pyle, CMA on 09/07/2009   Method used:   Print then Mail to Patient   RxID:   [id number] MEVACOR 20 MG  TABS (LOVASTATIN) qhs  #90 x 3   Entered by:   Margaret Pyle, CMA   Authorized by:   Minus Breeding MD   Signed by:   Margaret Pyle, CMA on 09/07/2009   Method used:   Print then Mail to Patient   RxID:   [id number] VZDGLO 100-12.5 MG  TABS (LOSARTAN POTASSIUM-HCTZ) qd  #90 x 3   Entered by:   Margaret Pyle, CMA   Authorized by:   Minus Breeding MD   Signed by:   Margaret Pyle, CMA on 09/07/2009   Method used:   Print then Mail to Patient   RxID:   [id number] NORVASC 2.5 MG  TABS (AMLODIPINE BESYLATE) qd  #90 x 3   Entered by:   Margaret Pyle, CMA   Authorized by:   Minus Breeding MD   Signed by:   Margaret Pyle, CMA on 09/07/2009   Method used:   Electronically to        [facility] [address] ([facility]ail)       [address]       [place], [place]  [address]       Ph: [phone]  Fax: [phone]   RxID:   [id number] MEVACOR 20 MG  TABS (LOVASTATIN) qhs  #90 x 3   Entered by:   Margaret Pyle, CMA   Authorized by:   Minus Breeding MD   Signed by:   Margaret Pyle, CMA on 09/07/2009   Method used:   Electronically to        [facility] [address] ([facility]ail)       [address], [address]       [place], [place]  [address]       Ph: [phone]       Fax: [phone]   RxID:   [id number] HYZAAR 100-12.5 MG  TABS (LOSARTAN POTASSIUM-HCTZ) qd  #90 x 3   Entered by:   Margaret Pyle, CMA   Authorized by:   Minus Breeding MD   Signed by:   Margaret Pyle, CMA on 09/07/2009   Method used:   [facility]ically to        [phone]  [address]       [address], [address]       [place], [place]  [address]       Ph: [phone]       Fax: [phone]   RxID:   [id number]

## 2010-10-10 NOTE — Progress Notes (Signed)
Summary: Allergy shots  Phone Note From Other Clinic   Caller: Dr. Romero Belling [phone] Call For: Dr. Alphonsus Sias, Francie Massing Summary of Call: Received call from Dr. George Hugh office, they are asking if pt can come here to have her allergy shots done? pt lives in [place] and doesn't want to travel to [place] to have injections done twice weekly. Pt will remain Dr. George Hugh pt but only come here for injections. I advised that I would speak with the mangers to see if this can be done. Please see phone note, 01/13/2010 from his office. Initial call taken by: Mervin Hack CMA Duncan Dull),  Jan 13, 2010 12:18 PM     Appended Document: Allergy shots don't know how I signed this, but please let me know.  Appended Document: Allergy shots This is a Adult nurse patient but we generally don't do this Will defer to [facility] about this

## 2010-10-10 NOTE — Miscellaneous (Signed)
Summary: Injection Record / [facility]    Injection Record / [facility]    Imported By: Lennie Odor 07/18/2010 15:02:00  _____________________________________________________________________  External Attachment:    Type:   Image     Comment:   External Document

## 2010-10-10 NOTE — Assessment & Plan Note (Signed)
Summary: CONGESTION/CONCERN ABOUT HER EAR/$50/PN   Vital Signs:  Patient profile:   65 year old female Height:      63 inches (160.02 cm) Weight:      140.0 pounds (63.64 kg) BMI:     24.89 O2 Sat:      95 % Temp:     97.5 degrees F (36.39 degrees C) oral Pulse rate:   69 / minute BP sitting:   162 / 70  (left arm) Cuff size:   regular  Vitals Entered By: Orlan Leavens (February 10, 2009 10:59 AM) CC: Cough/ Nasal congestion/ dizziness. pt states hse is coughing up thick clear mucus, URI symptoms Is Patient Diabetic? No Pain Assessment Patient in pain? no        Primary Care Provider:  Minus Breeding MD  CC:  Cough/ Nasal congestion/ dizziness. pt states hse is coughing up thick clear mucus and URI symptoms.  History of Present Illness:  URI Symptoms      This is a 65 year old woman who presents with URI symptoms.  The symptoms began 3 days ago.  recent antibiotic use 01/17/09 (augmentin) x 10d from allergist - similar signs improved but now recurrent.  The patient reports nasal congestion, clear nasal discharge, productive cough, and earache, but denies sore throat and sick contacts.  The patient denies fever, dyspnea, and wheezing.  The patient also reports seasonal symptoms and headache.  The patient denies itchy watery eyes and severe fatigue.   regularly follows with ENT and allergist for chronic allergy and sinus complaints. Has seen pulmonary in the past but not recently  Current Medications (verified): 1)  Prevacid 30 Mg  Cpdr (Lansoprazole) .... Take 1 By Mouth Qd 2)  Singulair 10 Mg  Tabs (Montelukast Sodium) .... Take 1 By Mouth Qd 3)  Albuterol 90 Mcg/act  Aers (Albuterol) .... Use Prn 4)  Travatan 0.004 %  Soln (Travoprost) .... Use 1 Drop in Each Eye Qd 5)  Hyzaar 100-12.5 Mg  Tabs (Losartan Potassium-Hctz) .... Qd 6)  Mevacor 20 Mg  Tabs (Lovastatin) .... Qhs 7)  Norvasc 2.5 Mg  Tabs (Amlodipine Besylate) .... Qd 8)  Astelin 137 Mcg/spray Soln (Azelastine Hcl) ....  2 Sprays Each Side Qd 9)  Asmanex 120 Metered Doses 220 Mcg/inh Aepb (Mometasone Furoate) .Marland Kitchen.. 1 Puff Qd 10)  Calcium 1200 Mg Tabs (Calcium Carbonate) .... Take Qd 11)  Vitamin D 1000 Unit Tabs (Cholecalciferol) .... Take 1 By Mouth Qd 12)  Probiotic  Caps (Misc Intestinal Flora Regulat) .... Take 1 By Mouth Qd 13)  Vitamin B-12 1000 Mcg Tabs (Cyanocobalamin) .... Take 1 By Mouth Qd 14)  Ginkoba 40 Mg Tabs (Ginkgo Biloba) .... Take 1 By Mouth Qd 15)  Zyrtec Allergy 10 Mg Tabs (Cetirizine Hcl) .... Take 1 By Mouth Qd  Allergies (verified): 1)  ! Ceftin 2)  ! Sulfa  Comments:  Nurse/Medical Assistant: The patient's medications and allergies were reviewed with the patient and were updated in the Medication and Allergy Lists. Valentina Gu Brand (February 10, 2009 11:04 AM)  Past History:  Past Medical History: Reviewed history from 04/14/2007 and no changes required. Asthma Colonic polyps, hx of GERD Osteoporosis Hiatal hernia Degenerative joint disease Episcleritis Simple goiter Urolithiasis Fibromyalgia dyslipidemia Hyperglycemia Seborrhea  Review of Systems      See HPI General:  Denies fatigue and fever. ENT:  Complains of hoarseness and sinus pressure; denies decreased hearing, difficulty swallowing, and ringing in ears. CV:  Denies chest pain  or discomfort and difficulty breathing at night. GI:  Denies nausea and vomiting.  Physical Exam  General:  alert, well-developed, well-nourished, and cooperative to examination.    Ears:  bilat tms red, sinus nontender Mouth:  pharyngeal erythema and fair dentition.  clear postnasal drainage appreciated Lungs:  normal respiratory effort, no intercostal retractions or use of accessory muscles; normal breath sounds bilaterally - no crackles and no wheezes.    Heart:  normal rate, regular rhythm, no murmur, and no rub. BLE without edema.    Impression & Recommendations:  Problem # 1:  SINUSITIS (ICD-473.9)  afebrile and nontoxic, but  increase in congestion and discharge symptoms Will retreat with antibiotics changing class of drugs (recently on Augmentin) Also resume short time decongestant with recognition of coexisting hypertension.  continue followup with ENT and allergist as ongoing. no need for pulmonary evaluation at this time  The following medications were removed from the medication list:    Avelox 400 Mg Tabs (Moxifloxacin hcl) .Marland Kitchen... 1 by mouth once daily for 10 days - gave in samples    Tessalon 200 Mg Caps (Benzonatate) .Marland Kitchen... 1 by mouth three times a day as needed cough Her updated medication list for this problem includes:    Astelin 137 Mcg/spray Soln (Azelastine hcl) .Marland Kitchen... 2 sprays each side qd    Avelox 400 Mg Tabs (Moxifloxacin hcl) .Marland Kitchen... 1 tablet by mouth daily    Nasonex 50 Mcg/act Susp (Mometasone furoate) .Marland Kitchen... 1 spray each nostril each day  Orders: Prescription Created Electronically [phone])  Problem # 2:  ALLERGIC RHINITIS (ICD-477.9)  see above. Continue decongestants short-term with chronic antihistamine and nasal steroid use The following medications were removed from the medication list:    Xyzal 5 Mg Tabs (Levocetirizine dihydrochloride) ..... Once daily Her updated medication list for this problem includes:    Astelin 137 Mcg/spray Soln (Azelastine hcl) .Marland Kitchen... 2 sprays each side qd    Zyrtec Allergy 10 Mg Tabs (Cetirizine hcl) .Marland Kitchen... Take 1 by mouth qd    Nasonex 50 Mcg/act Susp (Mometasone furoate) .Marland Kitchen... 1 spray each nostril each day  Orders: Prescription Created Electronically [phone])  Complete Medication List: 1)  Prevacid 30 Mg Cpdr (Lansoprazole) .... Take 1 by mouth qd 2)  Singulair 10 Mg Tabs (Montelukast sodium) .... Take 1 by mouth qd 3)  Albuterol 90 Mcg/act Aers (Albuterol) .... Use prn 4)  Travatan 0.004 % Soln (Travoprost) .... Use 1 drop in each eye qd 5)  Hyzaar 100-12.5 Mg Tabs (Losartan potassium-hctz) .... Qd 6)  Mevacor 20 Mg Tabs (Lovastatin) .... Qhs 7)  Norvasc  2.5 Mg Tabs (Amlodipine besylate) .... Qd 8)  Astelin 137 Mcg/spray Soln (Azelastine hcl) .... 2 sprays each side qd 9)  Asmanex 120 Metered Doses 220 Mcg/inh Aepb (Mometasone furoate) .Marland Kitchen.. 1 puff qd 10)  Calcium 1200 Mg Tabs (calcium Carbonate)  .... Take qd 11)  Vitamin D 1000 Unit Tabs (Cholecalciferol) .... Take 1 by mouth qd 12)  Probiotic Caps (Misc intestinal flora regulat) .... Take 1 by mouth qd 13)  Vitamin B-12 1000 Mcg Tabs (Cyanocobalamin) .... Take 1 by mouth qd 14)  Ginkoba 40 Mg Tabs (Ginkgo biloba) .... Take 1 by mouth qd 15)  Zyrtec Allergy 10 Mg Tabs (Cetirizine hcl) .... Take 1 by mouth qd 16)  Avelox 400 Mg Tabs (Moxifloxacin hcl) .Marland Kitchen.. 1 tablet by mouth daily 17)  Nasonex 50 Mcg/act Susp (Mometasone furoate) .Marland Kitchen.. 1 spray each nostril each day  Patient Instructions: 1)  Will retreat for  infection with Avelox, one tablet daily x7 days, prescription sent to your pharmacy 2)  Also, use Claritin-D daily for the next 7 days to help decongest. Then resume your previously prescribed Zyrtec (or other antihistamine) 3)  continue all other nasal sprays, as you're doing. 4)  Continue followup with allergist and ENT and consider pulmonary evaluation if persistent symptoms Prescriptions: AVELOX 400 MG  TABS (MOXIFLOXACIN HCL) 1 tablet by mouth daily  #7 x 0   Entered and Authorized by:   Newt Lukes MD   Signed by:   Newt Lukes MD on 02/10/2009   Method used:   Electronically to        [facility] [address] (retail)       [address]       [place], [place]  [address]       Ph: [phone]       Fax: [phone]   RxID:   [id number]

## 2010-10-10 NOTE — Letter (Signed)
Summary: [employer] Instructions  [facility]  [address] [place], [place] [address]   Phone: [phone]  Fax: [phone]       KORALEE WEDEKING    October 26, 1945    MRN: [id number]        Procedure Day /Date: Friday August 12th, 2011     Arrival Time: 1:30pm     Procedure Time: 2:30pm     Location of Procedure:                    _ x_  [facility] (4th Floor)                        PREPARATION FOR COLONOSCOPY WITH MOVIPREP   Starting 5 days prior to your procedure 04/17/10 do not eat nuts, seeds, popcorn, corn, beans, peas,  salads, or any raw vegetables.  Do not take any fiber supplements (e.g. Metamucil, Citrucel, and Benefiber).  THE DAY BEFORE YOUR PROCEDURE         DATE: 04/20/10  DAY: Thursday  1.  Drink clear liquids the entire day-NO SOLID FOOD  2.  Do not drink anything colored red or purple.  Avoid juices with pulp.  No orange juice.  3.  Drink at least 64 oz. (8 glasses) of fluid/clear liquids during the day to prevent dehydration and help the prep work efficiently.  CLEAR LIQUIDS INCLUDE: Water Jello Ice Popsicles Tea (sugar ok, no milk/cream) Powdered fruit flavored drinks Coffee (sugar ok, no milk/cream) Gatorade Juice: apple, white grape, white cranberry  Lemonade Clear bullion, consomm, broth Carbonated beverages (any kind) Strained chicken noodle soup Hard Candy                             4.  In the morning, mix first dose of MoviPrep solution:    Empty 1 Pouch A and 1 Pouch B into the disposable container    Add lukewarm drinking water to the top line of the container. Mix to dissolve    Refrigerate (mixed solution should be used within 24 hrs)  5.  Begin drinking the prep at 5:00 p.m. The MoviPrep container is divided by 4 marks.   Every 15 minutes drink the solution down to the next mark (approximately 8 oz) until the full liter is complete.   6.  Follow completed prep with 16 oz of clear liquid of your choice  (Nothing red or purple).  Continue to drink clear liquids until bedtime.  7.  Before going to bed, mix second dose of MoviPrep solution:    Empty 1 Pouch A and 1 Pouch B into the disposable container    Add lukewarm drinking water to the top line of the container. Mix to dissolve    Refrigerate  THE DAY OF YOUR PROCEDURE      DATE: 04/21/10 DAY: Friday  Beginning at 9:30 a.m. (5 hours before procedure):         1. Every 15 minutes, drink the solution down to the next mark (approx 8 oz) until the full liter is complete.  2. Follow completed prep with 16 oz. of clear liquid of your choice.    3. You may drink clear liquids until 12:30pm (2 HOURS BEFORE PROCEDURE).   MEDICATION INSTRUCTIONS  Unless otherwise instructed, you should take regular prescription medications with a small sip of water   as early as possible the morning of your  procedure.         OTHER INSTRUCTIONS  You will need a responsible adult at least 66 years of age to accompany you and drive you home.   This person must remain in the waiting room during your procedure.  Wear loose fitting clothing that is easily removed.  Leave jewelry and other valuables at home.  However, you may wish to bring a book to read or  an iPod/MP3 player to listen to music as you wait for your procedure to start.  Remove all body piercing jewelry and leave at home.  Total time from sign-in until discharge is approximately 2-3 hours.  You should go home directly after your procedure and rest.  You can resume normal activities the  day after your procedure.  The day of your procedure you should not:   Drive   Make legal decisions   Operate machinery   Drink alcohol   Return to work  You will receive specific instructions about eating, activities and medications before you leave.    The above instructions have been reviewed and explained to me by   _______________________    I fully understand and can verbalize  these instructions _____________________________ Date _________

## 2010-10-10 NOTE — Assessment & Plan Note (Signed)
Summary: ALLERGY SKIN TEST PER KATIE///kp   Vital Signs:  Patient profile:   65 year old female Height:      63 inches Weight:      137 pounds BMI:     24.36 O2 Sat:      98 % on Room air Pulse rate:   108 / minute BP sitting:   130 / 62  (left arm) Cuff size:   regular  Vitals Entered By: Reynaldo Minium CMA (Jan 11, 2010 2:03 PM)  O2 Flow:  Room air  Primary Provider/Referring Provider:  Minus Breeding MD  CC:  Allergy skin testing.  History of Present Illness: History of Present Illness: November 23, 2009-  63 yoF self-referred asking management of allergies and asthma. She had seen Dr Alwyn Ren and Dr Beaulah Dinning for these in the past. Her brother is a patient here and she decided to switch because it was time for allergy re-test. Complains of postnasal driop, sneeze and itch For the past 3 years she has had intervals of itching eyes, headache, retroorbital pressure, sorethroats and laryngitis. In some years October throught the winterhas been worst.She has been on allergy vaccine years ago with benefit. Has needed prednisone for winter bronchitis. Hx nasal polypectomy and septoplasty- Dr Jearld Fenton. Had pneumonia with sinusitis in 2 Had flu vax but never pneumovax. Hiatal hernia withh GERD  December 13, 2009- Allergic rhinitis, asthma Within past week has had gradual increasing head congestion, stoped up ears, laryngitis. Using her meds and Neti pot but little comes out. Gets itching eyes and her eye doctor told her it was allergy. Chest is mostly clear- used rescue inhaler yesterday. She is scheduled for PFT and Skin test May 4. IgE- 7.9 Allergy panel specific IgE- Neg  Jan 11, 2010- Allergic rhinitis, asthma Went to ER 12/26/09 with headache, nausea. Found irregular heart- not new. kept over night, had Echo, but says nothing new found. Dr Felicity Coyer saw in office f/u and gave antibioitc. She feels more comfortable, draining better off antihistamines, then says nasal drainage got in the way of her  PFT performance.. Was over- dried on Astelin plus Zyrtec. She worries that nasacort has been causing "tissue" to grow in her nose. PFT- WNL w/ no response to BD, FEV1/FVC 0.90. Skin test- Pos grass, weed, tree, dustmite   Current Medications (verified): 1)  Lansoprazole 30 Mg Cpdr (Lansoprazole) .Marland Kitchen.. 1 By Mouth Two Times A Day 2)  Singulair 10 Mg  Tabs (Montelukast Sodium) .... Take 1 By Mouth Qd 3)  Albuterol 90 Mcg/act  Aers (Albuterol) .... Use Prn 4)  Asmanex 120 Metered Doses 220 Mcg/inh Aepb (Mometasone Furoate) .Marland Kitchen.. 1 Puff Qd 5)  Astelin 137 Mcg/spray Soln (Azelastine Hcl) .... 2 Sprays Each Side Qd 6)  Nasacort Aq 55 Mcg/act Aers (Triamcinolone Acetonide(Nasal)) .... 2 Sprays in Each Nostril Once Daily 7)  Zyrtec Allergy 10 Mg Tabs (Cetirizine Hcl) .... Take 1 By Mouth Qd 8)  Hyzaar 100-12.5 Mg  Tabs (Losartan Potassium-Hctz) .... Qd 9)  Mevacor 20 Mg  Tabs (Lovastatin) .... Qhs 10)  Calcium 1200 Mg Tabs (Calcium Carbonate) .... Take Qd 11)  Vitamin D 1000 Unit Tabs (Cholecalciferol) .... Take 1 By Mouth Qd 12)  Vitamin B-12 1000 Mcg Tabs (Cyanocobalamin) .... Take 1 By Mouth Qd 13)  Vitamin C 500 Mg Tabs (Ascorbic Acid) .... Take 1 By Mouth Qd 14)  Lumigan 15)  Proventil Hfa 108 (90 Base) Mcg/act Aers (Albuterol Sulfate) .... As Needed 16)  Align  Caps (Probiotic Product) .... Once Daily 17)  Multivitamins  Tabs (Multiple Vitamin) .... Once Daily  Allergies (verified): 1)  ! Ceftin 2)  ! Sulfa  Past History:  Past Surgical History: Last updated: 11/23/2009 Hepatitis age 50 Neck Injury (1983) Stress Cardiolite (02/13/2002) EKG (12/04/2006 Nasal polypectomy and septoplasty colon polypectomy laparoscopy- gyn  Family History: Last updated: 11/23/2009 patient states she is concerned because her mother died from                           polymyositis. 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  Risk Factors: Smoking Status: never (04/14/2007)  Past Medical History: Asthma Episcleritis Simple goiter HYPERCHOLESTEROLEMIA (ICD-272.0) HYPERTENSION (ICD-401.9) SINUSITIS (ICD-473.9) ALLERGIC RHINITIS (ICD-477.9)- Skin test 01/11/10 INSOMNIA (ICD-780.52) FIBROIDS, UTERUS (ICD-218.9) GLAUCOMA (ICD-365.9) OSTEOPOROSIS (ICD-733.00) GERD (ICD-530.81) COLONIC POLYPS, HX OF (ICD-V12.72) ASTHMA (ICD-493.90)  Review of Systems      See HPI  The patient denies anorexia, fever, weight loss, weight gain, vision loss, decreased hearing, hoarseness, chest pain, syncope, dyspnea on exertion, peripheral edema, prolonged cough, headaches, hemoptysis, and severe indigestion/heartburn.    Physical Exam  Additional Exam:  General: A/Ox3; pleasant and cooperative, SKIN: plae birthmark left cheek NODES: no lymphadenopathy HEENT: [place]/AT,  Periorbital edema, EOM- WNL, Conjuctivae- clear, PERRLA, TM-retraction, Nose- turbinate edema, sniffing, Throat- clear and wnl, Mallampati  II NECK: Supple w/ fair ROM, JVD- none, normal carotid impulses w/o bruits Thyroid-  CHEST: Clear to P&A, dry cough HEART: RRR, no m/g/r heard ABDOMEN: Soft and nl;  BMW:UXLK, nl pulses, no edema  NEURO: Grossly intact to observation      Impression & Recommendations:  Problem # 1:  ALLERGIC RHINITIS (ICD-477.9)  Skin testing more sensitive than in vitro IgE assay. She comes in actively sniffing and coughing with nasal congestion and turbinate edema. She has several issues, probably including some GERD related discomfort, but eustachian dysfunction and rhinitis at this time of year and with this skin test pattern most likely allergic. Environmental/ dust precautions. She wants to go on allergy shots and wants to see if the office at [facility] could give her shots.We discussed options including cromolyn. We discussed the risk/ benefit considerations of vaccine and potential for  anaphyllaxis/ serious reactions. She found decongestants raise BP. Reluctant to give anonther depo shot this soon. Her updated medication list for this problem includes:    Astelin 137 Mcg/spray Soln (Azelastine hcl) .Marland Kitchen... 2 sprays each side qd    Nasacort Aq 55 Mcg/act Aers (Triamcinolone acetonide(nasal)) .Marland Kitchen... 2 sprays in each nostril once daily    Zyrtec Allergy 10 Mg Tabs (Cetirizine hcl) .Marland Kitchen... Take 1 by mouth qd  Problem # 2:  ASTHMA (ICD-493.90) Cough either from mild post nasal drip or cough-equivalent asthma.  Medications Added to Medication List This Visit: 1)  Epipen 0.3 Mg/0.77ml Devi (Epinephrine) .... For severe allergic reaction  Other Orders: Est. Patient Level III (44010) Allergy Puncture Test (27253) Allergy I.D Test (66440)  Patient Instructions: 1)  Please schedule a follow-up appointment in 2 months. 2)  We will check with [facility] about their ability to give your allergy shots. If you don't hear from [place] in the next couple of days, call back to discuss starting allergy shots. 3)  You can try otc nasal spray Nasalcrom/ cromolyn for your nose. Prescriptions: EPIPEN 0.3 MG/0.3ML DEVI (EPINEPHRINE) For severe allergic reaction  #1 x prn  Entered and Authorized by:   Waymon Budge MD   Signed by:   Waymon Budge MD on 01/11/2010   Method used:   Historical   RxID:   [id number]

## 2010-10-10 NOTE — Progress Notes (Signed)
Summary: results/see fax  Phone Note Call from Patient Call back at Home Phone [phone]   Caller: Patient/[phone] Call For: d rellison Summary of Call:  per pt call need her test esults  from [facility] that she had done 09-22-07  called [facility] will fax over report to the office October 03, 2007 11:38 AM  Initial call taken by: Shelbie Proctor,  October 03, 2007 11:37 AM  Follow-up for Phone Call        October 03, 2007 1:44 PM... called [facility] again no faxed sent spoke with mr will fax results over    Additional Follow-up for Phone Call Additional follow up Details #1::        mild abnormal right carpal tunnel only - dr Everardo All to further review on his return for disposition Additional Follow-up by: Corwin Levins MD,  October 03, 2007 2:41 PM    Additional Follow-up for Phone Call Additional follow up Details #2::    called pt to inform spoke with pt October 03, 2007 3:27 PM  Follow-up by: Shelbie Proctor,  October 03, 2007 3:27 PM

## 2010-10-10 NOTE — Progress Notes (Signed)
Summary: see new follow up  Phone Note Call from Patient Call back at Home Phone [phone]   Caller: Patient/[phone] Call For: DR Everardo All Summary of Call: NEED THE FOLLOWING RX MEDICATION  FOR 30 DAY SUPPLY WITH 3 REFILLS  LOVASTATIN 40 MG HYZAAR 50 ..........................................................ALSO NEED HER REPORT FROM [facility] ,WAS TOLD DR Everardo All WOULD REVIEW STILL HAVENT HEARD FROM THE REPORT STILL HAVE SOME PROBLEMS  Initial call taken by: Shelbie Proctor,  October 22, 2007 10:49 AM  Follow-up for Phone Call        rx sent i left message on phone tree Follow-up by: Minus Breeding MD,  October 22, 2007 12:39 PM  Additional Follow-up for Phone Call Additional follow up Details #1::        pt states she need written rx for lovastatin and hyzaar [facility]  Additional Follow-up by: Shelbie Proctor,  October 22, 2007 1:29 PM    Additional Follow-up for Phone Call Additional follow up Details #2::    printed and given to triage Follow-up by: Minus Breeding MD,  October 22, 2007 2:16 PM  Additional Follow-up for Phone Call Additional follow up Details #3:: Details for Additional Follow-up Action Taken: called pt to inform thatbrx will be at front desk for pick up spoke with pt October 22, 2007 2:51 PM  Additional Follow-up by: Shelbie Proctor,  October 22, 2007 2:51 PM    Prescriptions: HYZAAR 50-12.5 MG  TABS (LOSARTAN POTASSIUM-HCTZ) take 1 by mouth qd  #90 x 3   Entered and Authorized by:   Minus Breeding MD   Signed by:   Minus Breeding MD on 10/22/2007   Method used:   Print then Give to Patient   RxID:   [id number] LOVASTATIN 40 MG  TABS (LOVASTATIN) take 1 by mouth qd  #90 x 3   Entered and Authorized by:   Minus Breeding MD   Signed by:   Minus Breeding MD on 10/22/2007   Method used:   Print then Give to Patient   RxID:   [id number] LOVASTATIN 40 MG  TABS (LOVASTATIN) take 1 by  mouth qd  #30 x 11   Entered and Authorized by:   Minus Breeding MD   Signed by:   Minus Breeding MD on 10/22/2007   Method used:   Electronically sent to ...       [facility]       [address]       [place], [place]  [address]       Ph: [phone]       Fax: [phone]   RxID:   [id number] HYZAAR 50-12.5 MG  TABS (LOSARTAN POTASSIUM-HCTZ) take 1 by mouth qd  #30 x 11   Entered and Authorized by:   Minus Breeding MD   Signed by:   Minus Breeding MD on 10/22/2007   Method used:   Electronically sent to ...       [facility] [address]       [address]       [place], [place]  [address]       Ph: [phone]       Fax: [phone]   RxID:   [id number]

## 2010-10-10 NOTE — Letter (Signed)
Summary: [facility]  [facility]   Imported By: Maryln Gottron 01/28/2009 13:30:40  _____________________________________________________________________  External Attachment:    Type:   Image     Comment:   External Document

## 2010-10-10 NOTE — Consult Note (Signed)
Summary: [facility]  [facility]   Imported By: Esmeralda Links D'jimraou 10/17/2007 15:18:39  _____________________________________________________________________  External Attachment:    Type:   Image     Comment:   External Document

## 2010-10-10 NOTE — Progress Notes (Signed)
Summary: medication  Phone Note Call from Patient Call back at Home Phone [phone]   Caller: Patient/[phone] Call For: Minus Breeding MD Summary of Call: per Adalberto Ill   would like a rx for Prednisone ,states she still having problems with her ears and alittle Laryngitis .  want to know if she should come back in to see Dr ellsion    [facility]  [address] (retail) [address] [place], [place]  [address] Ph: [phone] Fax: [phone]  Initial call taken by: Shelbie Proctor,  July 26, 2008 9:32 AM  Follow-up for Phone Call        ov today Follow-up by: Minus Breeding MD,  July 26, 2008 12:34 PM  Additional Follow-up for Phone Call Additional follow up Details #1::        Phone Call Completed, Provider Notified, Appt Scheduled Today@3 :00,,pt made aware Additional Follow-up by: Shelbie Proctor,  July 26, 2008 1:34 PM

## 2010-10-10 NOTE — Progress Notes (Signed)
Summary: arm pain  Phone Note Call from Patient Call back at Home Phone [phone]   Caller: Patient Call For: dr ellision Summary of Call: per pt call having problems with her right arm  knot on her arm very painful ,hurts  when she lifts, swollen   need to see dr Everardo All asap  Initial call taken by: Shelbie Proctor,  August 28, 2007 11:33 AM  Follow-up for Phone Call        see other message Follow-up by: Minus Breeding MD,  August 28, 2007 1:37 PM

## 2010-10-10 NOTE — Assessment & Plan Note (Signed)
Summary: discuss meds/inhalers and side effects/kcw   Primary Provider/Referring Provider:  Minus Breeding MD  CC:  Pt has questions/concerns about her inhalers..  History of Present Illness:  Jan 11, 2010- Allergic rhinitis, asthma Went to ER 12/26/09 with headache, nausea. Found irregular heart- not new. kept over night, had Echo, but says nothing new found. Dr Felicity Coyer saw in office f/u and gave antibioitc. She feels more comfortable, draining better off antihistamines, then says nasal drainage got in the way of her PFT performance.. Was over- dried on Astelin plus Zyrtec. She worries that nasacort has been causing "tissue" to grow in her nose. PFT- WNL w/ no response to BD, FEV1/FVC 0.90. Skin test- Pos grass, weed, tree, dustmite  March 06, 2010- Allergic rhinitis, asthma, GERD Using rescue inhaler several times daily x 3 weeks for vague dyspnea without wheeze. Coughs up thick clear mucus. Does saline nasal rinse. Soreness midsternal level, seemingly around right lateral chest to mid back. Complains church too cold and going in and out of heat makes her worse. throat clearing. Increased her acid blocker to two times a day - helps. Sharp pains both temples. Building allergy vaccine.  May 01, 2010- Allergic rhinitis, asthma, GERD CXR- reviewed with her- LVP, no CHF. Prom markings c/w asthmatic bronhchitis, NAD. She tolerated EGD/ Colonoscopy. Found diverticulosis.  She asks about med changes. Concerned that inhaled steroids may be aggravating her esophageal irritation. Astelin is also irritating. Fall and Winter are her worst seasons.     Asthma History    Asthma Control Assessment:    Age range: 12+ years    Symptoms: 0-2 days/week    Nighttime Awakenings: 0-2/month    Interferes w/ normal activity: no limitations    SABA use (not for EIB): 0-2 days/week    ATAQ questionnaire: 0    Asthma Control Assessment: Well Controlled   Preventive Screening-Counseling &  Management  Alcohol-Tobacco     Smoking Status: never  Current Medications (verified): 1)  Lansoprazole 30 Mg Cpdr (Lansoprazole) .Marland Kitchen.. 1 By Mouth Two Times A Day 2)  Singulair 10 Mg  Tabs (Montelukast Sodium) .... Take 1 By Mouth Qd 3)  Asmanex 120 Metered Doses 220 Mcg/inh Aepb (Mometasone Furoate) .Marland Kitchen.. 1 Puff Qd 4)  Astelin 137 Mcg/spray Soln (Azelastine Hcl) .... 2 Sprays Each Side Qd 5)  Nasacort Aq 55 Mcg/act Aers (Triamcinolone Acetonide(Nasal)) .... 2 Sprays in Each Nostril Once Daily 6)  Zyrtec Allergy 10 Mg Tabs (Cetirizine Hcl) .... Take 1 By Mouth Qd 7)  Hyzaar 100-12.5 Mg  Tabs (Losartan Potassium-Hctz) .... Qd 8)  Mevacor 20 Mg  Tabs (Lovastatin) .... Qhs 9)  Calcium 600mg /vitamin D/magnesium Tablets .... Take 2 By Mouth Once Daily 10)  B-Complex/b-12  Tabs (B Complex Vitamins) .... Once Daily 11)  Vitamin C 500 Mg Tabs (Ascorbic Acid) .... Take 1 By Mouth Qd 12)  Lumigan 13)  Proventil Hfa 108 (90 Base) Mcg/act Aers (Albuterol Sulfate) .... As Needed 14)  Align  Caps (Probiotic Product) .... Once Daily 15)  Multivitamins  Tabs (Multiple Vitamin) .... Once Daily 16)  Epipen 0.3 Mg/0.29ml Devi (Epinephrine) .... For Severe Allergic Reaction 17)  Allergy Vaccine Gh New Start 18)  Zaditor 0.025 % Soln (Ketotifen Fumarate) .Marland Kitchen.. 1 Drop in Each Eye Two Times A Day As Needed 19)  Maalox Plus 225-200-25 Mg/29ml Susp (Alum & Mag Hydroxide-Simeth) .... Take As Needed 20)  Diflucan 100 Mg Tabs (Fluconazole) .Marland Kitchen.. 1 By Mouth Once Daily For 10 Days  Allergies (verified):  1)  ! Ceftin 2)  ! Sulfa  Past History:  Past Medical History: Last updated: 04/06/2010 Asthma Episcleritis Simple goiter HYPERCHOLESTEROLEMIA (ICD-272.0) HYPERTENSION (ICD-401.9) SINUSITIS (ICD-473.9) ALLERGIC RHINITIS (ICD-477.9)- Skin test 01/11/10 INSOMNIA (ICD-780.52) FIBROIDS, UTERUS (ICD-218.9) GLAUCOMA (ICD-365.9) OSTEOPOROSIS (ICD-733.00) GERD (ICD-530.81) Colonic leiomyoma ASTHMA  (ICD-493.90) Hepatitis ?Type  age 8 IBS  Past Surgical History: Last updated: 04/06/2010 Nasal polypectomy and septoplasty laparoscopy- gyn Neck Injury (7829)  Stress Cardiolite (02/13/2002) EKG (12/04/2006 colon polypectomy  Family History: Last updated: 04/06/2010 patient states she is concerned because her mother died from                           polymyositis. 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   Risk Factors: Smoking Status: never (05/01/2010)  Review of Systems      See HPI  The patient denies shortness of breath with activity, shortness of breath at rest, productive cough, non-productive cough, coughing up blood, chest pain, irregular heartbeats, acid heartburn, indigestion, loss of appetite, weight change, abdominal pain, difficulty swallowing, sore throat, tooth/dental problems, headaches, nasal congestion/difficulty breathing through nose, and sneezing.    Vital Signs:  Patient profile:   65 year old female Height:      63 inches Weight:      139.13 pounds BMI:     24.73 O2 Sat:      100 % on Room air Pulse rate:   66 / minute BP sitting:   118 / 64  (left arm) Cuff size:   regular  Vitals Entered By: Reynaldo Minium CMA (May 01, 2010 11:52 AM)  O2 Flow:  Room air CC: Pt has questions/concerns about her inhalers.   Physical Exam  Additional Exam:  General: A/Ox3; pleasant and cooperative, SKIN: pale birthmark left cheek NODES: no lymphadenopathy HEENT: [place]/AT,  Periorbital edema, EOM- WNL, Conjuctivae- clear, PERRLA, TM-retraction, Nose- turbinate edema, sniffing, Throat- clear and wnl, Mallampati  III, frequent throat clearing- less than last visit.Not red or cobblestoned. NECK: Supple w/ fair ROM, JVD- none, normal carotid impulses w/o bruits Thyroid-  CHEST: Clear to P&A, no cough or wheeze. HEART: RRR,  no m/g/r heard ABDOMEN: Soft and nl;  FAO:ZHYQ, nl pulses, no edema  NEURO: Grossly intact to observation      CXR  Procedure date:  03/06/2010  Findings:      CHEST - 2 VIEW   Comparison: 08/26/2008   Findings: Heart size within normal limits.  There is a prominent left ventricular contour.  Aorta somewhat tortuous.   There is central airway thickening with mild hyperaeration.  No active airspace disease or pleural fluid.   IMPRESSION:   1.  Prominent left ventricular contour without heart failure. 2.  Central airway thickening and mild hyperaeration, but no active disease.   Read By:  Bernerd Limbo,  M.D.     Released By:  Bernerd Limbo,  M.D.   Impression & Recommendations:  Problem # 1:  ALLERGIC RHINITIS (ICD-477.9)  We will take her off Nasal steroid and Astelin to reassess needs. She can try Nasalcrom as a non-steroidal nasal spray to try.She wanted to reduce exposure to irritants and inhaled steroids. The following medications were removed from the medication list:    Astelin 137 Mcg/spray Soln (Azelastine hcl) .Marland Kitchen... 2 sprays each side qd    Nasacort Aq 55 Mcg/act  Aers (Triamcinolone acetonide(nasal)) .Marland Kitchen... 2 sprays in each nostril once daily Her updated medication list for this problem includes:    Zyrtec Allergy 10 Mg Tabs (Cetirizine hcl) .Marland Kitchen... Take 1 by mouth qd    Nasalcrom 5.2 Mg/act Aers (Cromolyn sodium) .Marland Kitchen... 1-2 sprays each nostri.qid as needed  Problem # 2:  ASTHMA (ICD-493.90) Not wheezing now. I will take her off Asmanex to see if she needs it and to reduce thrush tendency. She has just finished fluconazole given by Dr Russella Dar for eophageal yeast.  Problem # 3:  GERD (ICD-530.81) She is beginning to appreciate that GERD has been a bigger cause of substernal discomfort than she realized, accounting for much of her substernal discomforts she was attributing to her lungs. Her updated medication list for this problem includes:    Lansoprazole 30  Mg Cpdr (Lansoprazole) .Marland Kitchen... 1 by mouth two times a day    Maalox Plus 225-200-25 Mg/61ml Susp (Alum & mag hydroxide-simeth) .Marland Kitchen... Take as needed  Medications Added to Medication List This Visit: 1)  Calcium 600mg /vitamin D/magnesium Tablets  .... Take 2 by mouth once daily 2)  B-complex/b-12 Tabs (B complex vitamins) .... Once daily 3)  Nasalcrom 5.2 Mg/act Aers (Cromolyn sodium) .Marland Kitchen.. 1-2 sprays each nostri.qid as needed  Other Orders: Est. Patient Level IV (40981)  Patient Instructions: 1)  Please schedule a follow-up appointment in 3 months. 2)  Try off Astelin  and Nasacort nose sprays. Instead, try otc Nasalcrom/ cromolyn- follow the directions on the box. This has no steroid. 3)  Try off Asmanex because of the steroid. We will see how your asthma does now without it.  Prescriptions: NASALCROM 5.2 MG/ACT AERS (CROMOLYN SODIUM) 1-2 sprays each nostri.qid as needed  #1 x prn   Entered and Authorized by:   Waymon Budge MD   Signed by:   Waymon Budge MD on 05/01/2010   Method used:   Historical   RxID:   [id number]      CXR  Procedure date:  03/06/2010  Findings:      CHEST - 2 VIEW   Comparison: 08/26/2008   Findings: Heart size within normal limits.  There is a prominent left ventricular contour.  Aorta somewhat tortuous.   There is central airway thickening with mild hyperaeration.  No active airspace disease or pleural fluid.   IMPRESSION:   1.  Prominent left ventricular contour without heart failure. 2.  Central airway thickening and mild hyperaeration, but no active disease.   Read By:  Bernerd Limbo,  M.D.     Released By:  Bernerd Limbo,  M.D.

## 2010-10-10 NOTE — Progress Notes (Signed)
Summary: PRESCRIPT  Phone Note Call from Patient   Caller: Patient Call For: Shakirah Kirkey Summary of Call: NEED PRESCRIPTION FOR 90 DAY SUPPLY MAILED TO HER HOME FOR Sharyn Dross TWISTHALER 220 ASTELIN PROVENT HFA NASACORT WITH 3 REFILLS Initial call taken by: Rickard Patience,  February 20, 2010 10:28 AM  Follow-up for Phone Call        ATC pt at home #.  LMOM TCB.  are these for a mail order pharmacy?  would she like [place] to send them for her? Follow-up by: Boone Master CNA/MA,  February 20, 2010 10:40 AM  Additional Follow-up for Phone Call Additional follow up Details #1::        pt returned call from triage. she needs these rx's sent to her so that she can put them with a form and send to the [place] in [place].  she says she gave all this info when she dropped off paper. call her back at same #. Tivis Ringer, CNA  February 20, 2010 4:13 PM    Additional Follow-up for Phone Call Additional follow up Details #2::    pt requesting rx for 90 day supply with 3 refilss for the following medication: singulari, Asmanex, Astelin, Proventil, and Nasacort.These are on pt med list, but I do not see where Dr. Maple Hudson has ever refilled or prescribed these medications, looks like has gone through PMD. Please advise if ok to refill these meds. thanks. Carron Curie CMA  February 20, 2010 4:52 PM   Per CDY ok to give meds as requested for 90 day supply.Reynaldo Minium CMA  February 20, 2010 5:17 PM   rx printed and mailed to pt [address] [place], [place] [address]. Pt aware.  Carron Curie CMA  February 20, 2010 5:27 PM   Prescriptions: PROVENTIL HFA 108 (90 BASE) MCG/ACT AERS (ALBUTEROL SULFATE) as needed  #3 x 3   Entered by:   Carron Curie CMA   Authorized by:   Waymon Budge MD   Signed by:   Carron Curie CMA on 02/20/2010   Method used:   Print then Give to Patient   RxID:   [id number] NASACORT AQ 55 MCG/ACT AERS (TRIAMCINOLONE ACETONIDE(NASAL)) 2 sprays in each nostril once daily  #3 x 3   Entered  by:   Carron Curie CMA   Authorized by:   Waymon Budge MD   Signed by:   Carron Curie CMA on 02/20/2010   Method used:   Print then Give to Patient   RxID:   [id number] ASTELIN 137 MCG/SPRAY SOLN (AZELASTINE HCL) 2 sprays each side qd  #3 x 3   Entered by:   Carron Curie CMA   Authorized by:   Waymon Budge MD   Signed by:   Carron Curie CMA on 02/20/2010   Method used:   Print then Give to Patient   RxID:   [id number] ASMANEX 120 METERED DOSES 220 MCG/INH AEPB (MOMETASONE FUROATE) 1 puff qd  #3 x 3   Entered by:   Carron Curie CMA   Authorized by:   Waymon Budge MD   Signed by:   Carron Curie CMA on 02/20/2010   Method used:   Print then Give to Patient   RxID:   [id number] SINGULAIR 10 MG  TABS (MONTELUKAST SODIUM) take 1 by mouth qd  #90 x 3   Entered by:   Carron Curie CMA   Authorized by:   Waymon Budge MD   Signed by:  Carron Curie CMA on 02/20/2010   Method used:   Print then Give to Patient   RxID:   [id number]

## 2010-10-10 NOTE — Progress Notes (Signed)
Summary: Cough/Congestion  Phone Note Outgoing Call   Call placed by: Reynaldo Minium CMA,  March 16, 2010 5:16 PM Call placed to: Patient Summary of Call: Spoke with pt regarding results and pt states that she is still coughing up clear thick phlegm and alot of congestion. Would like to know what she can take or do for this. Pt has an appt with her GI dr at the end of August-checking to see if GERD is the cause of the cough. Please advise. Initial call taken by: Reynaldo Minium CMA,  March 16, 2010 5:17 PM  Follow-up for Phone Call        ALLERGIES: CEFTIN and SULFA.  Additional Follow-up for Phone Call Additional follow up Details #1::        Offer prednisone 10 mg tabs, # 20 1 tab four times daily x 2 days, 3 times daily x 2 days, 2 times daily x 2 days, 1 time daily x 2 days  Additional Follow-up by: Waymon Budge MD,  March 16, 2010 5:35 PM    Additional Follow-up for Phone Call Additional follow up Details #2::    Spoke with patient; aware of RX being sent and directions. Send to [facility] in [employer] in EMR.Reynaldo Minium CMA  March 16, 2010 5:37 PM   New/Updated Medications: PREDNISONE 10 MG TABS (PREDNISONE) take 4 x 2 days, 3 x 2 days, 2 x 2 days, 1 x 2 days, then stop Prescriptions: PREDNISONE 10 MG TABS (PREDNISONE) take 4 x 2 days, 3 x 2 days, 2 x 2 days, 1 x 2 days, then stop  #20 x 0   Entered by:   Reynaldo Minium CMA   Authorized by:   Waymon Budge MD   Signed by:   Reynaldo Minium CMA on 03/16/2010   Method used:   Electronically to        [facility] [address] [phone].* (retail)       [address]       [place], [place]  [address]       Ph: [phone]       Fax: [phone]   RxID:   [id number]

## 2010-10-10 NOTE — Miscellaneous (Signed)
Summary: norvasc  Clinical Lists Changes  Medications: Rx of NORVASC 2.5 MG  TABS (AMLODIPINE BESYLATE) qd;  #90 x 2;  Signed;  Entered by: Orlan Leavens;  Authorized by: Minus Breeding MD;  Method used: Print then Give to Patient    Prescriptions: NORVASC 2.5 MG  TABS (AMLODIPINE BESYLATE) qd  #90 x 2   Entered by:   Orlan Leavens   Authorized by:   Minus Breeding MD   Signed by:   Orlan Leavens on 01/28/2008   Method used:   Print then Give to Patient   RxID:   [id number]

## 2010-10-10 NOTE — Progress Notes (Signed)
Summary: needs apt  Phone Note Call from Patient Call back at Home Phone [phone]   Caller: Patient Call For: young Summary of Call: patient wants to be seen today or tomorrow. she has sinus problems, congestion, ears hurt, and  loosing voice. Initial call taken by: Valinda Hoar,  December 12, 2009 12:56 PM  Follow-up for Phone Call        called and spoke with pt.  pt scheduled to see CY tomorrow at 3:00pm.  Arman Filter LPN  December 12, 6385 1:35 PM

## 2010-10-10 NOTE — Letter (Signed)
Summary: [facility]  [facility]   Imported By: Lester [place] 07/10/2010 07:20:42  _____________________________________________________________________  External Attachment:    Type:   Image     Comment:   External Document

## 2010-10-10 NOTE — Miscellaneous (Signed)
Summary: Injection record  Injection record   Imported By: Lester [place] 06/01/2010 09:59:28  _____________________________________________________________________  External Attachment:    Type:   Image     Comment:   External Document

## 2010-10-10 NOTE — Miscellaneous (Signed)
Summary: [facility]  [facility]   Imported By: Sherian Rein 01/19/2010 07:22:39  _____________________________________________________________________  External Attachment:    Type:   Image     Comment:   External Document

## 2010-10-10 NOTE — Assessment & Plan Note (Signed)
Summary: head congestion x 3 weeks/apc   Primary Provider/Referring Provider:  Minus Breeding MD  CC:  Head congestion x 3 weeks; "bad" headaches; stuffy nose; ear pain and neck pain; moving into chest; hoarsness.Marland Kitchen  History of Present Illness:  December 13, 2009- Allergic rhinitis, asthma Within past week has had gradual increasing head congestion, stoped up ears, laryngitis. Using her meds and Neti pot but little comes out. Gets itching eyes and her eye doctor told her it was allergy. Chest is mostly clear- used rescue inhaler yesterday. She is scheduled for PFT and Skin test May 4. IgE- 7.9 Allergy panel specific IgE- Neg  Jan 11, 2010- Allergic rhinitis, asthma Went to ER 12/26/09 with headache, nausea. Found irregular heart- not new. kept over night, had Echo, but says nothing new found. Dr Felicity Coyer saw in office f/u and gave antibioitc. She feels more comfortable, draining better off antihistamines, then says nasal drainage got in the way of her PFT performance.. Was over- dried on Astelin plus Zyrtec. She worries that nasacort has been causing "tissue" to grow in her nose. PFT- WNL w/ no response to BD, FEV1/FVC 0.90. Skin test- Pos grass, weed, tree, dustmite  March 06, 2010- Allergic rhinitis, asthma, GERD Using rescue inhaler several times daily x 3 weeks for vague dyspnea without wheeze. Coughs up thick clear mucus. Does saline nasal rinse. Soreness midsternal level, seemingly around right lateral chest to mid back. Complains church too cold and going in and out of heat makes her worse. throat clearing. Increased her acid blocker to two times a day - helps. Sharp pains both temples. Building allergy vaccine.    Asthma History    Initial Asthma Severity Rating:    Age range: 12+ years    Symptoms: >2 days/week; not daily    Nighttime Awakenings: 0-2/month    Interferes w/ normal activity: no limitations    SABA use (not for EIB): >2 days/week but not >1X/day    Asthma  Severity Assessment: Mild Persistent   Preventive Screening-Counseling & Management  Alcohol-Tobacco     Smoking Status: never  Current Medications (verified): 1)  Lansoprazole 30 Mg Cpdr (Lansoprazole) .Marland Kitchen.. 1 By Mouth Two Times A Day 2)  Singulair 10 Mg  Tabs (Montelukast Sodium) .... Take 1 By Mouth Qd 3)  Asmanex 120 Metered Doses 220 Mcg/inh Aepb (Mometasone Furoate) .Marland Kitchen.. 1 Puff Qd 4)  Astelin 137 Mcg/spray Soln (Azelastine Hcl) .... 2 Sprays Each Side Qd 5)  Nasacort Aq 55 Mcg/act Aers (Triamcinolone Acetonide(Nasal)) .... 2 Sprays in Each Nostril Once Daily 6)  Zyrtec Allergy 10 Mg Tabs (Cetirizine Hcl) .... Take 1 By Mouth Qd 7)  Hyzaar 100-12.5 Mg  Tabs (Losartan Potassium-Hctz) .... Qd 8)  Mevacor 20 Mg  Tabs (Lovastatin) .... Qhs 9)  Calcium 1200 Mg Tabs (Calcium Carbonate) .... Take Qd 10)  Vitamin D 1000 Unit Tabs (Cholecalciferol) .... Take 1 By Mouth Qd 11)  Vitamin B-12 1000 Mcg Subl (Cyanocobalamin) .... Take 1 By Mouth Once Daily 12)  Vitamin C 500 Mg Tabs (Ascorbic Acid) .... Take 1 By Mouth Qd 13)  Lumigan 14)  Proventil Hfa 108 (90 Base) Mcg/act Aers (Albuterol Sulfate) .... As Needed 15)  Align  Caps (Probiotic Product) .... Once Daily 16)  Multivitamins  Tabs (Multiple Vitamin) .... Once Daily 17)  Epipen 0.3 Mg/0.55ml Devi (Epinephrine) .... For Severe Allergic Reaction 18)  Allergy Vaccine Gh New Start  Allergies (verified): 1)  ! Ceftin 2)  ! Sulfa  Past  History:  Past Medical History: Last updated: 01/11/2010 Asthma Episcleritis Simple goiter HYPERCHOLESTEROLEMIA (ICD-272.0) HYPERTENSION (ICD-401.9) SINUSITIS (ICD-473.9) ALLERGIC RHINITIS (ICD-477.9)- Skin test 01/11/10 INSOMNIA (ICD-780.52) FIBROIDS, UTERUS (ICD-218.9) GLAUCOMA (ICD-365.9) OSTEOPOROSIS (ICD-733.00) GERD (ICD-530.81) COLONIC POLYPS, HX OF (ICD-V12.72) ASTHMA (ICD-493.90)  Past Surgical History: Last updated: 11/23/2009 Hepatitis age 73 Neck Injury (1983) Stress Cardiolite  (02/13/2002) EKG (12/04/2006 Nasal polypectomy and septoplasty colon polypectomy laparoscopy- gyn  Family History: Last updated: 11/23/2009 patient states she is concerned because her mother died from                           polymyositis. 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   Review of Systems      See HPI       The patient complains of productive cough, chest pain, and acid heartburn.  The patient denies shortness of breath with activity, shortness of breath at rest, non-productive cough, coughing up blood, irregular heartbeats, indigestion, loss of appetite, weight change, abdominal pain, difficulty swallowing, sore throat, tooth/dental problems, headaches, nasal congestion/difficulty breathing through nose, and sneezing.    Vital Signs:  Patient profile:   65 year old female Height:      63 inches Weight:      141 pounds O2 Sat:      97 % on Room air Temp:     98.9 degrees F oral Pulse rate:   69 / minute BP sitting:   140 / 70  (left arm) Cuff size:   regular  Vitals Entered By: Reynaldo Minium CMA (March 06, 2010 10:28 AM)  O2 Flow:  Room air CC: Head congestion x 3 weeks; "bad" headaches; stuffy nose; ear pain, neck pain; moving into chest; hoarsness.   Physical Exam  Additional Exam:  General: A/Ox3; pleasant and cooperative, SKIN: plae birthmark left cheek NODES: no lymphadenopathy HEENT: [place]/AT,  Periorbital edema, EOM- WNL, Conjuctivae- clear, PERRLA, TM-retraction, Nose- turbinate edema, sniffing, Throat- clear and wnl, Mallampati  III, frequent throat clearing. NECK: Supple w/ fair ROM, JVD- none, normal carotid impulses w/o bruits Thyroid-  CHEST: Clear to P&A, dry cough HEART: RRR, no m/g/r heard ABDOMEN:  Soft and nl;  ZOX:WRUE, nl pulses, no edema  NEURO: Grossly intact to observation      Impression & Recommendations:  Problem # 1:  ALLERGIC RHINITIS (ICD-477.9)  She is continuing her allergy vaccine buildup- We again discussed options of getting her maintenance vaccine injjections in [place]. Her updated medication list for this problem includes:    Astelin 137 Mcg/spray Soln (Azelastine hcl) .Marland Kitchen... 2 sprays each side qd    Nasacort Aq 55 Mcg/act Aers (Triamcinolone acetonide(nasal)) .Marland Kitchen... 2 sprays in each nostril once daily    Zyrtec Allergy 10 Mg Tabs (Cetirizine hcl) .Marland Kitchen... Take 1 by mouth qd  Problem # 2:  GERD (ICD-530.81)  Throat clearing and chest pain very suggestive of GERD. She will continue two times a day Rx and supplement with a coating med like maalox if needed. I asked her to see Dr Russella Dar. Her updated medication list for this problem includes:    Lansoprazole 30 Mg Cpdr (Lansoprazole) .Marland Kitchen... 1 by mouth two times a day  Orders: Est. Patient Level IV (45409)  Problem # 3:  ASTHMA (ICD-493.90) She is using  more of her rescue inhaler than usual. We discused maintenance therapy as she contnues Asmanex. We will check CXR  Medications Added to Medication List This Visit: 1)  Vitamin B-12 1000 Mcg Subl (Cyanocobalamin) .... Take 1 by mouth once daily  Other Orders: T-2 View CXR (71020TC)  Patient Instructions: 1)  Please schedule a follow-up appointment in 6 months. 2)  Continue building allergy vaccine 3)  See Dr Russella Dar about your reflux symptoms 4)  Try maalox between doses of your acid blocker, to see if it helps the soreness in your chest and raw throats 5)  A chest x-ray has been recommended.  Your imaging study may require preauthorization.

## 2010-10-10 NOTE — Progress Notes (Signed)
Summary: speak to [facility]  Phone Note Call from Patient Call back at [facility] Phone [phone]   Caller: Patient Call For: Martavion Couper Summary of Call: wants to speak to [facility] about receiving allergy shots in [place] Initial call taken by: Tivis Ringer, CNA,  Jan 17, 2010 1:06 PM  Follow-up for Phone Call        Tammy or Lynnae Sandhoff, Please call [place] office ([place]) and see if they willing to take on pt and give allergy shots as pt lives in [place]; too long to drive to [place] twice a week.Please advise and let me know. Reynaldo Minium CMA  Jan 17, 2010 1:42 PM   Additional Follow-up for Phone Call Additional follow up Details #1::        Please let pt know that [facility] office can not give allergy shots to pt; if she wants to start please let us know and we can get serum for her to start taking shots at our office. Reynaldo Minium CMA  Jan 17, 2010 3:28 PM     Additional Follow-up for Phone Call Additional follow up Details #2::    I called over there and apparently someone has already spoken to [place]. She said to her she'd know all about. So ask her. Follow-up by: Dimas Millin,  Jan 17, 2010 2:44 PM  Additional Follow-up for Phone Call Additional follow up Details #3:: Details for Additional Follow-up Action Taken: Florentina Addison the allergy lab needs rx for this from [place], pt is fine with coming here for injections   Philipp Deputy CMA  Jan 17, 2010 3:56 PM   Sent to [facility] as a reminder to get RX for them to start allergy vaccine. Reynaldo Minium CMA  Jan 17, 2010 4:48 PM   New/Updated Medications: * ALLERGY VACCINE GH NEW START

## 2010-10-10 NOTE — Progress Notes (Signed)
Summary: R ARM SWOLLEN X 2 WKS  Phone Note Call from Patient   Summary of Call: PER PT R ARM LITTLE SWOLLEN X 2 WKS Initial call taken by: Ivar Bury,  August 28, 2007 11:32 AM  Follow-up for Phone Call        if red, go to er.  otherwise, ov here tomorrow Follow-up by: Minus Breeding MD,  August 28, 2007 1:36 PM  Additional Follow-up for Phone Call Additional follow up Details #1::        Phone Call Completed, Rx Called In, Appt Scheduled for friday at 2:30 pt stated her arm is a bit red but will wait to see dr Everardo All on friday, did advise pt to go to er if arm is red , she stated she wanted to wait to see dr Everardo All Additional Follow-up by: Shelbie Proctor,  August 28, 2007 1:59 PM

## 2010-10-10 NOTE — Assessment & Plan Note (Signed)
Summary: Laryngitis /problems with ears/dd   Vital Signs:  Patient Profile:   65 Years Old Female Weight:      142.2 pounds O2 Sat:      97 % O2 treatment:    Room Air Temp:     97.7 degrees F oral Pulse rate:   71 / minute BP sitting:   118 / 72  (left arm) Cuff size:   regular  Pt. in pain?   no  Vitals Entered By: Orlan Leavens (July 26, 2008 3:25 PM)                  Chief Complaint:  ? Laryngitis.  History of Present Illness: pt says she is somewhat better since last ov.  main sxs are nasal congest and left ear pain.    Prior Medications Reviewed Using: Patient Recall  Updated Prior Medication List: CALTRATE 600+D 600-400 MG-UNIT  TABS (CALCIUM CARBONATE-VITAMIN D) take 1 by mouth qd PREVACID 30 MG  CPDR (LANSOPRAZOLE) take 1 by mouth qd SINGULAIR 10 MG  TABS (MONTELUKAST SODIUM) take 1 by mouth qd COD LIVER OIL   CAPS (COD LIVER OIL) take 1 by mouth qd XYZAL 5 MG  TABS (LEVOCETIRIZINE DIHYDROCHLORIDE) once daily ALBUTEROL 90 MCG/ACT  AERS (ALBUTEROL) use prn TRAVATAN 0.004 %  SOLN (TRAVOPROST) USE 1 DROP IN EACH EYE QD AZMACORT 75 MCG/ACT  AERS (TRIAMCINOLONE ACETONIDE) USE PRN HYZAAR 100-12.5 MG  TABS (LOSARTAN POTASSIUM-HCTZ) qd MEVACOR 20 MG  TABS (LOVASTATIN) qhs NORVASC 2.5 MG  TABS (AMLODIPINE BESYLATE) qd TRAZODONE HCL 100 MG TABS (TRAZODONE HCL) qhs  Current Allergies (reviewed today): ! CEFTIN ! SULFA  Past Medical History:    Reviewed history from 04/14/2007 and no changes required:       Asthma       Colonic polyps, hx of       GERD       Osteoporosis       Hiatal hernia       Degenerative joint disease       Episcleritis       Simple goiter       Urolithiasis       Fibromyalgia       dyslipidemia       Hyperglycemia       Seborrhea     Review of Systems  The patient denies fever.     Physical Exam  General:     well developed, well nourished, in no acute distress Head:     head is normocephalic eyes: no scleral  icterus no periorbital swelling perrl external ears are normal nose normal externally mouth has no lesion, including normal tongue  Ears:     left tm is red, right normal    Impression & Recommendations:  Problem # 1:  URI (ICD-465.9)  Medications Added to Medication List This Visit: 1)  Astelin 137 Mcg/spray Soln (Azelastine hcl) .... 2 sprays each side qd 2)  Asmanex 120 Metered Doses 220 Mcg/inh Aepb (Mometasone furoate) .Marland Kitchen.. 1 puff qd 3)  Doxycycline Hyclate 100 Mg Tabs (Doxycycline hyclate) .... Bid 4)  Medrol (pak) 4 Mg Tabs (Methylprednisolone) .... As dir   Patient Instructions: 1)  doxyxycline 100 two times a day x 10d 2)  medrol dosepack 3)  cc dr Beaulah Dinning   Prescriptions: NORVASC 2.5 MG  TABS (AMLODIPINE BESYLATE) qd  #90 x 3   Entered and Authorized by:   Minus Breeding MD   Signed by:   Minus Breeding  MD on 07/26/2008   Method used:   Print then Give to Patient   RxID:   [id number] HYZAAR 100-12.5 MG  TABS (LOSARTAN POTASSIUM-HCTZ) qd  #90 x 3   Entered and Authorized by:   Minus Breeding MD   Signed by:   Minus Breeding MD on 07/26/2008   Method used:   Print then Give to Patient   RxID:   [id number] MEVACOR 20 MG  TABS (LOVASTATIN) qhs  #90 x 3   Entered and Authorized by:   Minus Breeding MD   Signed by:   Minus Breeding MD on 07/26/2008   Method used:   Print then Give to Patient   RxID:   [id number] MEDROL (PAK) 4 MG TABS (METHYLPREDNISOLONE) as dir  #1 pack x 0   Entered and Authorized by:   Minus Breeding MD   Signed by:   Minus Breeding MD on 07/26/2008   Method used:   Electronically to        [facility] [address] (retail)       [address], [address]       [place], [place]  [address]       Ph: [phone]       Fax: [phone]   RxID:   [id number] DOXYCYCLINE HYCLATE 100 MG TABS (DOXYCYCLINE HYCLATE) bid  #20 x 0   Entered and Authorized by:   Minus Breeding MD   Signed by:   Minus Breeding MD on 07/26/2008   Method used:   Electronically to        [facility] [address] (retail)       [address], [address]       [place], [place]  [address]       Ph: [phone]       Fax: [phone]   RxID:   [id number]  ]

## 2010-10-10 NOTE — Letter (Signed)
Summary: Patient Notice-Endo Biopsy Results  [facility]  [address] [place], [place] [address]   Phone: [phone]  Fax: [phone]        April 25, 2010 MRN: [id number]    [facility] [address] [place], [place]  [address]    Dear Ms. DORSAINVIL,  I am pleased to inform you that the biopsies taken during your recent endoscopic examination did not show any evidence of cancer upon pathologic examination. The biopsies showed changes of acid reflux.  Continue with the treatment plan as outlined on the day of your      exam.  Please call us if you are having persistent problems or have questions about your condition that have not been fully answered at this time.  Sincerely,  Meryl Dare MD [facility]  This letter has been electronically signed by your physician.  Appended Document: Patient Notice-Endo Biopsy Results letter mailed

## 2010-10-10 NOTE — Procedures (Signed)
Summary: Colonoscopy and biopsy   Colonoscopy  Procedure date:  07/08/2001  Findings:      Results: Polyp.  Location:  [facility].    Procedures Next Due Date:    Colonoscopy: 07/2006  Colonoscopy  Procedure date:  07/08/2001  Findings:      Results: Polyp.  Location:  [facility].    Procedures Next Due Date:    Colonoscopy: 07/2006 Patient Name: Samantha [name], Samantha [name] MRN:  Procedure Procedures: Colonoscopy CPT: 47829.    with polypectomy. CPT: A3573898.  Personnel: Endoscopist: Venita Lick. Russella Dar, MD, Clementeen Graham.  Referred By: Roxy Manns, MD.  Exam Location: Exam performed in Outpatient Clinic. Outpatient  Patient Consent: Procedure, Alternatives, Risks and Benefits discussed, consent obtained, from patient.  Indications  Evaluation of: Positive fecal occult blood test  History  Pre-Exam Physical: Performed Jul 08, 2001. Cardio-pulmonary exam, Rectal exam, HEENT exam , Abdominal exam, Extremity exam, Neurological exam, Mental status exam WNL.  Exam Exam: Extent of exam reached: Cecum, extent intended: Cecum.  The cecum was identified by appendiceal orifice and IC valve. Colon retroflexion performed. ASA Classification: II. Tolerance: excellent.  Monitoring: Pulse and BP monitoring, Oximetry used. Supplemental O2 given.  Colon Prep Used Golytely for colon prep. Prep results: excellent.  Sedation Meds: Patient assessed and found to be appropriate for moderate (conscious) sedation. Fentanyl 100 mcg. Versed 7 mg.  Findings POLYP: Transverse Colon, Maximum size: 10 mm. pedunculated polyp. Distance from Anus 65 cm. Procedure:  snare with cautery, removed, retrieved, Polyp sent to pathology. ICD9: Colon Polyps: 211.3.  NORMAL EXAM: Cecum to Transverse Colon.  NORMAL EXAM: Splenic Flexure to Rectum.   Assessment  Diagnoses: 211.3: Colon Polyps.   Events  Unplanned Interventions: No intervention was required.  Unplanned Events: There  were no complications. Plans  Post Exam Instructions: No aspirin or non-steroidal containing medications: 2 weeks.  Medication Plan: Await pathology. Continue current medications.  Patient Education: Patient given standard instructions for: Polyps.  Disposition: After procedure patient sent to recovery. After recovery patient sent home.  Scheduling/Referral: Colonoscopy, to [facility] T. Russella Dar, MD, [facility], around Jul 08, 2006.  Referring provider, to Roxy Manns, MD,    This report was created from the original endoscopy report, which was reviewed and signed by the above listed endoscopist.    cc: Roxy Manns, MD      SP Surgical Pathology - STATUS: Final             By: Windy Kalata,      Perform Date: 29Oct02 15:08  Ordered By: Rica Records Date:  Facility: [facility]                              Department: CPATH  Service Report Text  [facility]   [address] [place], [place] [address]   [phone]    REPORT OF SURGICAL PATHOLOGY    Case #: [id number]   Patient Name: Samantha [name]   PID: [id number]   Pathologist: Alden Server A. Delila Spence, MD   DOB/Age [birth year] (Age: 65) Gender: F   Date Taken: 07/08/2001   Date Received: 07/08/2001    FINAL DIAGNOSIS    ***MICROSCOPIC EXAMINATION AND DIAGNOSIS***    TRANSVERSE COLON POLYP: BENIGN COLONIC MUCOSA WITH SMALL   SUBMUCOSAL LEIOMYOMA, BENIGN.    mw   Date Reported: 07/09/2001 Alden Server  Alice Rieger, MD   *** Electronically Signed Out By EAA ***    Clinical information   R/O adenoma, heme + stool (smr)    specimen(s) obtained   Transverse polyp    Gross Description   Received in formalin is a tan, soft tissue fragment that is   submitted en toto. Size: 0.7 cm. (GP:jn, 07/08/01)    jn/

## 2010-10-10 NOTE — Assessment & Plan Note (Signed)
Summary: DR Everardo All PT/NOT IN CLINIC-POST HOSP-STC   Vital Signs:  Patient profile:   65 year old female Height:      63 inches (160.02 cm) Weight:      137.4 pounds (62.45 kg) O2 Sat:      97 % on Room air Temp:     97.2 degrees F (36.22 degrees C) oral Pulse rate:   81 / minute BP sitting:   122 / 68  (left arm) Cuff size:   regular  Vitals Entered By: Orlan Leavens (December 27, 2009 10:31 AM)  O2 Flow:  Room air CC: ER F/U Is Patient Diabetic? No Pain Assessment Patient in pain? no        Primary Care Provider:  Minus Breeding MD  CC:  ER F/U.  History of Present Illness: here for ER f/u -  stayed overnight in ER 4/17-4/18 for weakness, elev BP and CP - neg labs including POC enz and neg stress echo -?refer to cards ([facility] -pt wishes to not go)  still with weakness and sinus pressure - saw pulm -young - for same 4/5 - given medrol shot with min improvement - using nasal steroids and saline spray - mild frontal HA, no fever, +thick nasal dc, +daily GERD symptoms -  -  Date:  12/25/2009    WBC: 7.3    HGB: 13.3    HCT: 38.3    RBC: 4.35    PLT: 187    MCV: 88.1    RDW: 13.9    Neutrophil: 61    Lymphs: 33    Monos: 6    Eos: 0    Basophil: 0    BG Random: 92    BUN: 14    Creatinine: 0.83    Sodium: 138    Potassium: 3.4    Chloride: 104    CO2 Total: 29    Calcium: 9.7  Current Medications (verified): 1)  Prevacid 30 Mg  Cpdr (Lansoprazole) .... Take 1 By Mouth Qd 2)  Singulair 10 Mg  Tabs (Montelukast Sodium) .... Take 1 By Mouth Qd 3)  Albuterol 90 Mcg/act  Aers (Albuterol) .... Use Prn 4)  Asmanex 120 Metered Doses 220 Mcg/inh Aepb (Mometasone Furoate) .Marland Kitchen.. 1 Puff Qd 5)  Astelin 137 Mcg/spray Soln (Azelastine Hcl) .... 2 Sprays Each Side Qd 6)  Nasacort Aq 55 Mcg/act Aers (Triamcinolone Acetonide(Nasal)) .... 2 Sprays in Each Nostril Once Daily 7)  Zyrtec Allergy 10 Mg Tabs (Cetirizine Hcl) .... Take 1 By Mouth Qd 8)  Hyzaar 100-12.5 Mg  Tabs  (Losartan Potassium-Hctz) .... Qd 9)  Mevacor 20 Mg  Tabs (Lovastatin) .... Qhs 10)  Calcium 1200 Mg Tabs (Calcium Carbonate) .... Take Qd 11)  Vitamin D 1000 Unit Tabs (Cholecalciferol) .... Take 1 By Mouth Qd 12)  Vitamin B-12 1000 Mcg Tabs (Cyanocobalamin) .... Take 1 By Mouth Qd 13)  Vitamin C 500 Mg Tabs (Ascorbic Acid) .... Take 1 By Mouth Qd 14)  Lumigan 15)  Proventil Hfa 108 (90 Base) Mcg/act Aers (Albuterol Sulfate) .... As Needed 16)  Align  Caps (Probiotic Product) .... Once Daily 17)  Multivitamins  Tabs (Multiple Vitamin) .... Once Daily  Allergies (verified): 1)  ! Ceftin 2)  ! Sulfa  Past History:  Past Medical History: Asthma Episcleritis Simple goiter HYPERCHOLESTEROLEMIA (ICD-272.0) HYPERTENSION (ICD-401.9) SINUSITIS (ICD-473.9) ALLERGIC RHINITIS (ICD-477.9) INSOMNIA (ICD-780.52) FIBROIDS, UTERUS (ICD-218.9) GLAUCOMA (ICD-365.9) OSTEOPOROSIS (ICD-733.00) GERD (ICD-530.81) COLONIC POLYPS, HX OF (ICD-V12.72) ASTHMA (ICD-493.90)  Review of Systems  The  patient denies vision loss, hoarseness, chest pain, syncope, and abdominal pain.         c/o insomnia; c/o fatigue  Physical Exam  General:  alert, well-developed, well-nourished, and cooperative to examination.   dtr/son at side Eyes:  vision grossly intact; pupils equal, round and reactive to light.  conjunctiva and lids normal.    Ears:  normal pinnae bilaterally, without erythema, swelling, or tenderness to palpation. TMs clear, without effusion, or cerumen impaction. Hearing grossly normal bilaterally  Mouth:  pharyngeal erythema and fair dentition.  clear postnasal drainage appreciated Chest Wall:  No deformities, masses, or tenderness noted. Lungs:  normal respiratory effort, no intercostal retractions or use of accessory muscles; normal breath sounds bilaterally - no crackles and no wheezes.    Heart:  normal rate, regular rhythm, no murmur, and no rub. BLE without edema. Neurologic:  alert &  oriented X3 and cranial nerves II-XII symetrically intact.  strength normal in all extremities, sensation intact to light touch, and gait normal. speech fluent without dysarthria or aphasia; follows commands with good comprehension.    Impression & Recommendations:  Problem # 1:  SINUSITIS (ICD-473.9)  pt with chonic symptoms -  recently given steroid shot w/o abx for same symptoms - as may be contrib to dizziness and weakness symptoms, re-tx with abx now and continue eval for same by pulm - cont spray tx for chonic mgmt of same Her updated medication list for this problem includes:    Astelin 137 Mcg/spray Soln (Azelastine hcl) .Marland Kitchen... 2 sprays each side qd    Nasacort Aq 55 Mcg/act Aers (Triamcinolone acetonide(nasal)) .Marland Kitchen... 2 sprays in each nostril once daily    Augmentin 875-125 Mg Tabs (Amoxicillin-pot clavulanate) .Marland Kitchen... 1 by mouth two times a day x 7days  Orders: Prescription Created Electronically [phone])  Problem # 2:  CHEST PAIN (ICD-786.50) s/p ER eval 4/17-4/18 for same - labs and stressecho reviewed in depth - low risk cardiac study no recurrent CP and BP now well controlled (?exac by use of OTC decongestants prior to ER eval) will defer cardiology eval at this time given resolution of symptoms and low risk study but consider eval by [facility] cards if recurrent symptoms -  same reviewed and explained to pt/family who understand and agree - Time spent with patient/family 30 minutes, more than 50% of this time was spent counseling patient on eval and tx of current symptoms -  Problem # 3:  INSOMNIA (ICD-780.52) rec OTC meds for same - to f/u with PCP  Problem # 4:  GERD (ICD-530.81) inc PPI to two times a day until acute issues and symptoms improve - new rx for same provided for mail off Her updated medication list for this problem includes:    Lansoprazole 30 Mg Cpdr (Lansoprazole) .Marland Kitchen... 1 by mouth two times a day  Complete Medication List: 1)  Lansoprazole 30 Mg Cpdr  (Lansoprazole) .Marland Kitchen.. 1 by mouth two times a day 2)  Singulair 10 Mg Tabs (Montelukast sodium) .... Take 1 by mouth qd 3)  Albuterol 90 Mcg/act Aers (Albuterol) .... Use prn 4)  Asmanex 120 Metered Doses 220 Mcg/inh Aepb (Mometasone furoate) .Marland Kitchen.. 1 puff qd 5)  Astelin 137 Mcg/spray Soln (Azelastine hcl) .... 2 sprays each side qd 6)  Nasacort Aq 55 Mcg/act Aers (Triamcinolone acetonide(nasal)) .... 2 sprays in each nostril once daily 7)  Zyrtec Allergy 10 Mg Tabs (Cetirizine hcl) .... Take 1 by mouth qd 8)  Hyzaar 100-12.5 Mg Tabs (Losartan potassium-hctz) .... Qd 9)  Mevacor 20 Mg Tabs (Lovastatin) .... Qhs 10)  Calcium 1200 Mg Tabs (calcium Carbonate)  .... Take qd 11)  Vitamin D 1000 Unit Tabs (Cholecalciferol) .... Take 1 by mouth qd 12)  Vitamin B-12 1000 Mcg Tabs (Cyanocobalamin) .... Take 1 by mouth qd 13)  Vitamin C 500 Mg Tabs (Ascorbic acid) .... Take 1 by mouth qd 14)  Lumigan  15)  Proventil Hfa 108 (90 Base) Mcg/act Aers (Albuterol sulfate) .... As needed 16)  Align Caps (Probiotic product) .... Once daily 17)  Multivitamins Tabs (Multiple vitamin) .... Once daily 18)  Augmentin 875-125 Mg Tabs (Amoxicillin-pot clavulanate) .Marland Kitchen.. 1 by mouth two times a day x 7days  Patient Instructions: 1)  it was good to see you today. 2)  hospital stay and labs/tests reviewed today - will hold off on referral to cardiology at this time 3)  will treat for possible sinus infection contributing to your symptoms - Augmentin x 7days - your prescriptions have been electronically submitted to your pharmacy. Please take as directed. Contact our office if you believe you're having problems with the medication(s). 4)  increase the acid reflux medication to two times a day - prescription for 90day with refills provided  5)  keep appointment as scheduled 5/4 for allergy and lung testing - 6)  Please schedule a follow-up appointment with Dr. Everardo All in 1-2 weeks, sooner if problems.  7)  Try simply sleep  (otc mediction for sleep) as needed  Prescriptions: AUGMENTIN 875-125 MG TABS (AMOXICILLIN-POT CLAVULANATE) 1 by mouth two times a day x 7days  #14 x 0   Entered and Authorized by:   Newt Lukes MD   Signed by:   Newt Lukes MD on 12/27/2009   Method used:   Electronically to        [facility] [address] (retail)       [address], [address]       [place], [place]  [address]       Ph: [phone]       Fax: [phone]   RxID:   [id number] LANSOPRAZOLE 30 MG CPDR (LANSOPRAZOLE) 1 by mouth two times a day  #180 x 3   Entered and Authorized by:   Newt Lukes MD   Signed by:   Newt Lukes MD on 12/27/2009   Method used:   Print then Give to Patient   RxID:   [id number]    Stress Echocardiogram  Procedure date:  12/26/2009  Findings:      Done @ [facility] Impression; Low risk exercise stress echocardiogram with no echocardiograpic evidence of ischemis. Decreased exercise tolerance. Hypertension at baseline.   Left ventricular ejection fraction was normal at rest and with stress. Normal echo stress.

## 2010-10-10 NOTE — Miscellaneous (Signed)
Summary: Injection Orders / [facility]    Injection Orders / [facility]    Imported By: Lennie Odor 02/17/2010 13:58:37  _____________________________________________________________________  External Attachment:    Type:   Image     Comment:   External Document

## 2010-10-10 NOTE — Letter (Signed)
Summary: [facility]  [facility]   Imported By: Lanelle Bal 07/30/2008 13:21:24  _____________________________________________________________________  External Attachment:    Type:   Image     Comment:   External Document

## 2010-10-10 NOTE — Assessment & Plan Note (Signed)
Summary: 2 WK FU-$50-STC   Vital Signs:  Patient Profile:   65 Years Old Female Weight:      138 pounds Temp:     98.5 degrees F oral Pulse rate:   61 / minute BP sitting:   152 / 69  (left arm) Cuff size:   regular  Vitals Entered By: Orlan Leavens (Jan 28, 2008 9:18 AM)                 Visit Type:  Follow-up Visit  Chief Complaint:  bp.  History of Present Illness: takes bp meds as rx'ed.  pt states few days of nasal congestion, and mod assoc right earache. now on lower dosage of mevacor    Current Allergies: ! CEFTIN ! SULFA  Past Medical History:    Reviewed history from 04/14/2007 and no changes required:       Asthma       Colonic polyps, hx of       GERD       Osteoporosis       Hiatal hernia       Degenerative joint disease       Episcleritis       Simple goiter       Urolithiasis       Fibromyalgia       dyslipidemia       Hyperglycemia       Seborrhea     Review of Systems  The patient denies fever.         has slight sore throat   Physical Exam  General:     well developed, well nourished, in no acute distress Ears:     right tm is very red, left is normal Extremities:     no edema Additional Exam:     CHOLESTEROL               165 mg/dL                   1-610       ATP III Classification:             < 200       mg/dL      Desireable            200 - 239    mg/dL      Borderline High            > = 240      mg/dL      High   TRIGLYCERIDES             43 mg/dL                    9-604        Normal:  < 150 mg/dL        Borderline High:  150 - 199 mg/dL   HDL                       54.0 mg/dL                  >98.1   VLDL CHOLESTEROL          9 mg/dl                     1-91   LDL CHOLESTEROL           59 mg/dl  Impression & Recommendations:  Problem # 1:  HYPERTENSION (ICD-401.9)  Her updated medication list for this problem includes:    Hyzaar 100-12.5 Mg Tabs (Losartan potassium-hctz) ..... Qd  Norvasc 2.5 Mg Tabs (Amlodipine besylate) ..... Qd  Orders: Est. Patient Level IV (29937)   Problem # 2:  HYPERCHOLESTEROLEMIA (ICD-272.0) well-controlled Her updated medication list for this problem includes:    Mevacor 20 Mg Tabs (Lovastatin) ..... Qhs  Orders: TLB-Lipid Panel (80061-LIPID)   Problem # 3:  uri  Medications Added to Medication List This Visit: 1)  Norvasc 2.5 Mg Tabs (Amlodipine besylate) .... Qd 2)  Ceftin 250 Mg Tabs (Cefuroxime axetil) .... Qd  Other Orders: TLB-Hepatic/Liver Function Pnl (80076-HEPATIC)   Patient Instructions: 1)  add norvasc 2.5/d 2)  ceftin 250-bid 3)  same mevacor 4)  ret few mos   Prescriptions: CEFTIN 250 MG  TABS (CEFUROXIME AXETIL) qd  #14 x 0   Entered and Authorized by:   Minus Breeding MD   Signed by:   Minus Breeding MD on 01/28/2008   Method used:   Electronically sent to ...       [facility] [address]       [address]       [place], [place]  [address]       Ph: [phone]       Fax: [phone]   RxID:   [id number] NORVASC 2.5 MG  TABS (AMLODIPINE BESYLATE) qd  #30 x 11   Entered and Authorized by:   Minus Breeding MD   Signed by:   Minus Breeding MD on 01/28/2008   Method used:   Electronically sent to ...       [facility] [address]       [address]       [place], [place]  [address]       Ph: [phone]       Fax: [phone]   RxID:   [id number]  ]

## 2010-10-10 NOTE — Assessment & Plan Note (Signed)
Summary: rib pain/triage b workin/cd   Vital Signs:  Patient Profile:   65 Years Old Female Weight:      142 pounds O2 Sat:      97 % O2 treatment:    Room Air Temp:     97.7 degrees F oral Pulse rate:   89 / minute BP sitting:   124 / 70  (left arm) Cuff size:   regular  Vitals Entered By: Payton Spark CMA (August 26, 2008 3:30 PM)                 Chief Complaint:  rib pain .  History of Present Illness: here after seeing dr Everardo All yesterday and tx with augmentin but today with increased prod cough greenish sputum and bilat lateral lower chest pain, sharp and pleuritic , mild to moderate;  she is very nervous about having pna; no chills , sob, wheezing, doe, orthopnea, pnd or LE edema; did take the augment as per dr Everardo All last night and today without diffictuly  but still very nervous with the pain seeming to get worse today    Updated Prior Medication List: CALTRATE 600+D 600-400 MG-UNIT  TABS (CALCIUM CARBONATE-VITAMIN D) take 1 by mouth qd PREVACID 30 MG  CPDR (LANSOPRAZOLE) take 1 by mouth qd SINGULAIR 10 MG  TABS (MONTELUKAST SODIUM) take 1 by mouth qd COD LIVER OIL   CAPS (COD LIVER OIL) take 1 by mouth qd XYZAL 5 MG  TABS (LEVOCETIRIZINE DIHYDROCHLORIDE) once daily ALBUTEROL 90 MCG/ACT  AERS (ALBUTEROL) use prn TRAVATAN 0.004 %  SOLN (TRAVOPROST) USE 1 DROP IN EACH EYE QD HYZAAR 100-12.5 MG  TABS (LOSARTAN POTASSIUM-HCTZ) qd MEVACOR 20 MG  TABS (LOVASTATIN) qhs NORVASC 2.5 MG  TABS (AMLODIPINE BESYLATE) qd TRAZODONE HCL 100 MG TABS (TRAZODONE HCL) qhs ASTELIN 137 MCG/SPRAY SOLN (AZELASTINE HCL) 2 sprays each side qd ASMANEX 120 METERED DOSES 220 MCG/INH AEPB (MOMETASONE FUROATE) 1 puff qd AVELOX 400 MG TABS (MOXIFLOXACIN HCL) 1 by mouth once daily for 10 days - gave in samples TESSALON 200 MG CAPS (BENZONATATE) 1 by mouth three times a day as needed cough DARVOCET-N 100 100-650 MG TABS (PROPOXYPHENE N-APAP) 1po q 6 hrs as needed pain  Current Allergies  (reviewed today): ! CEFTIN ! SULFA  Past Medical History:    Reviewed history from 04/14/2007 and no changes required:       Asthma       Colonic polyps, hx of       GERD       Osteoporosis       Hiatal hernia       Degenerative joint disease       Episcleritis       Simple goiter       Urolithiasis       Fibromyalgia       dyslipidemia       Hyperglycemia       Seborrhea  Past Surgical History:    Reviewed history from 04/14/2007 and no changes required:       Hepatitis age 66       Neck Injury (1983)       Stress Cardiolite (02/13/2002)       EKG (12/04/2006   Social History:    Reviewed history from 05/10/2008 and no changes required:       retired       widowed    Review of Systems       all otherwise negative    Physical Exam  General:     alert and well-developed.  , mild ill  Head:     Normocephalic and atraumatic without obvious abnormalities. No apparent alopecia or balding. Eyes:     No corneal or conjunctival inflammation noted. EOMI. Perrla. Ears:     bilat tms red, sinus nontender Nose:     nasal dischargemucosal pallor and mucosal erythema.   Mouth:     pharyngeal erythema and fair dentition.   Neck:     supple and full ROM.   Lungs:     Normal respiratory effort, chest expands symmetrically. Lungs are clear to auscultation, no crackles or wheezes. Heart:     Normal rate and regular rhythm. S1 and S2 normal without gallop, murmur, click, rub or other extra sounds. Extremities:     no edema, no ulcers     Impression & Recommendations:  Problem # 1:  BRONCHITIS-ACUTE (ICD-466.0)  Her updated medication list for this problem includes:    Singulair 10 Mg Tabs (Montelukast sodium) .Marland Kitchen... Take 1 by mouth qd    Albuterol 90 Mcg/act Aers (Albuterol) ..... Use prn    Asmanex 120 Metered Doses 220 Mcg/inh Aepb (Mometasone furoate) .Marland Kitchen... 1 puff qd    Avelox 400 Mg Tabs (Moxifloxacin hcl) .Marland Kitchen... 1 by mouth once daily for 10 days - gave in  samples    Tessalon 200 Mg Caps (Benzonatate) .Marland Kitchen... 1 by mouth three times a day as needed cough treat as above, f/u any worsening signs or symptoms   Problem # 2:  CHEST PAIN (ICD-786.50) for cxr - cannot r/o pna -= tx with as needed darvocet short term Orders: T-2 View CXR, Same Day (71020.5TC)   Problem # 3:  HYPERTENSION (ICD-401.9)  Her updated medication list for this problem includes:    Hyzaar 100-12.5 Mg Tabs (Losartan potassium-hctz) ..... Qd    Norvasc 2.5 Mg Tabs (Amlodipine besylate) ..... Qd  BP today: 124/70 Prior BP: 118/72 (07/26/2008)  Labs Reviewed: Creat: 0.8 (06/16/2008) Chol: 160 (05/10/2008)   HDL: 92.0 (05/10/2008)   LDL: 53 (05/10/2008)   TG: 73 (05/10/2008) stable overall by hx and exam, ok to continue meds/tx as is   Complete Medication List: 1)  Caltrate 600+d 600-400 Mg-unit Tabs (Calcium carbonate-vitamin d) .... Take 1 by mouth qd 2)  Prevacid 30 Mg Cpdr (Lansoprazole) .... Take 1 by mouth qd 3)  Singulair 10 Mg Tabs (Montelukast sodium) .... Take 1 by mouth qd 4)  Cod Liver Oil Caps (Cod liver oil) .... Take 1 by mouth qd 5)  Xyzal 5 Mg Tabs (Levocetirizine dihydrochloride) .... Once daily 6)  Albuterol 90 Mcg/act Aers (Albuterol) .... Use prn 7)  Travatan 0.004 % Soln (Travoprost) .... Use 1 drop in each eye qd 8)  Hyzaar 100-12.5 Mg Tabs (Losartan potassium-hctz) .... Qd 9)  Mevacor 20 Mg Tabs (Lovastatin) .... Qhs 10)  Norvasc 2.5 Mg Tabs (Amlodipine besylate) .... Qd 11)  Trazodone Hcl 100 Mg Tabs (Trazodone hcl) .... Qhs 12)  Astelin 137 Mcg/spray Soln (Azelastine hcl) .... 2 sprays each side qd 13)  Asmanex 120 Metered Doses 220 Mcg/inh Aepb (Mometasone furoate) .Marland Kitchen.. 1 puff qd 14)  Avelox 400 Mg Tabs (Moxifloxacin hcl) .Marland Kitchen.. 1 by mouth once daily for 10 days - gave in samples 15)  Tessalon 200 Mg Caps (Benzonatate) .Marland Kitchen.. 1 by mouth three times a day as needed cough 16)  Darvocet-n 100 100-650 Mg Tabs (Propoxyphene n-apap) .Marland Kitchen.. 1po q 6 hrs as  needed pain   Patient Instructions: 1)  stop the augmentin 2)  please take the avelox 400 mg - 1 per day for 10 days,  the tessalon for the cough as needed, and the pain med (darvocet generic) as needed for pain 3)  Please go to Radiology in the basement level for your X-Ray today  4)  Please schedule an appointment with your primary doctor as needed   Prescriptions: DARVOCET-N 100 100-650 MG TABS (PROPOXYPHENE N-APAP) 1po q 6 hrs as needed pain  #40 x 0   Entered and Authorized by:   Corwin Levins MD   Signed by:   Corwin Levins MD on 08/26/2008   Method used:   Print then Give to Patient   RxID:   [id number] TESSALON 200 MG CAPS (BENZONATATE) 1 by mouth three times a day as needed cough  #50 x 1   Entered and Authorized by:   Corwin Levins MD   Signed by:   Corwin Levins MD on 08/26/2008   Method used:   Print then Give to Patient   RxID:   [id number]  ]

## 2010-10-10 NOTE — Procedures (Signed)
Summary: Colonoscopy  Patient: Samantha [name] Note: All result statuses are Final unless otherwise noted.  Tests: (1) Colonoscopy (COL)   COL Colonoscopy           DONE     [facility]     [address]     [place], [place]  [address]           COLONOSCOPY PROCEDURE REPORT           PATIENT:  Samantha, [name]  MR#:  [id number]     BIRTHDATE:  Jul 30, 1946, 63 yrs. old  GENDER:  female     ENDOSCOPIST:  Judie Petit T. Russella Dar, MD, [facility]           PROCEDURE DATE:  04/21/2010     PROCEDURE:  Colonoscopy with biopsy and snare polypectomy     ASA CLASS:  Class II     INDICATIONS:  1) unexplained diarrhea     MEDICATIONS:   Fentanyl 75 mcg IV, Versed 8 mg IV     DESCRIPTION OF PROCEDURE:   After the risks benefits and     alternatives of the procedure were thoroughly explained, informed     consent was obtained.  Digital rectal exam was performed and     revealed no abnormalities.   The LB [id number] endoscope     was introduced through the anus and advanced to the cecum, which     was identified by both the appendix and ileocecal valve, without     limitations.  The quality of the prep was excellent, using     MoviPrep.  The instrument was then slowly withdrawn as the colon     was fully examined.     <<PROCEDUREIMAGES>>     FINDINGS:  A sessile polyp was found in the ascending colon. It     was 5 mm in size. Polyp was snared without cautery. Retrieval was     successful.  Mild diverticulosis was found in the ascending colon.     A normal appearing cecum, ileocecal valve, and appendiceal orifice     were identified. The hepatic flexure, transverse, splenic flexure,     descending, sigmoid colon, and rectum appeared unremarkable.     Random biopsies were obtained and sent to pathology.  Retroflexed     views in the rectum revealed no abnormalities. The time to cecum =     3.25  minutes. The scope was then withdrawn (time =  9.75  min)     from the patient and the procedure  completed.           COMPLICATIONS:  None           ENDOSCOPIC IMPRESSION:     1) 5 mm sessile polyp in the ascending colon     2) Mild diverticulosis in the ascending colon           RECOMMENDATIONS:     1) Await pathology results     2) High fiber diet with liberal fluid intake.     3) If the polyp removed today is adenomatous (pre-cancerous) ,     colonoscopy in 5 years. Otherwise follow colorectal cancer     screening guidelines for "routine risk" patients with colonoscopy     in 10 years.           Venita Lick. Russella Dar, MD, [facility]           n.     eSIGNED:   Venita Lick. Russella Dar  at 04/21/2010 02:54 PM           Adalberto Ill, [id number]  Note: An exclamation mark (!) indicates a result that was not dispersed into the flowsheet. Document Creation Date: 04/21/2010 2:55 PM _______________________________________________________________________  (1) Order result status: Final Collection or observation date-time: 04/21/2010 14:51 Requested date-time:  Receipt date-time:  Reported date-time:  Referring Physician:   Ordering Physician: Claudette Head [phone]) Specimen Source:  Source: Launa Grill Order Number: [id number] Lab site:   Appended Document: Colonoscopy     Procedures Next Due Date:    Colonoscopy: 04/2020

## 2010-10-10 NOTE — Progress Notes (Signed)
Summary: ? re meds  Phone Note Call from Patient Call back at Home Phone [phone]   Caller: Patient Call For: Russella Dar Reason for Call: Talk to Nurse Summary of Call: Patient wants to speak to nurse regarding medication she's taking. Initial call taken by: Tawni Levy,  April 25, 2010 9:24 AM  Follow-up for Phone Call        Patient  questions if she should stop her steroid and inhalers due to yeast.  Patient  advised to continue on Diflucan as ordered, see Dr Maple Hudson next week and continue on all current meds unless advised to stop them by Dr Maple Hudson. Follow-up by: Darcey Nora RN, CGRN,  April 25, 2010 9:51 AM

## 2010-10-10 NOTE — Miscellaneous (Signed)
Summary:  Fluconazole rx  Clinical Lists Changes  Medications: Added new medication of DIFLUCAN 100 MG TABS (FLUCONAZOLE) 1 by mouth once daily for 10 days - Signed Rx of DIFLUCAN 100 MG TABS (FLUCONAZOLE) 1 by mouth once daily for 10 days;  #10 x 1;  Signed;  Entered by: Karl Bales RN;  Authorized by: Meryl Dare MD [facility];  Method used: Electronically to [facility] [phone].*, [address], [place], [place], [place]  [address], Ph: [phone], Fax: [phone]    Prescriptions: DIFLUCAN 100 MG TABS (FLUCONAZOLE) 1 by mouth once daily for 10 days  #10 x 1   Entered by:   Karl Bales RN   Authorized by:   Meryl Dare MD [facility]   Signed by:   Karl Bales RN on 04/21/2010   Method used:   Electronically to        [facility] [address] [phone].* (retail)       [address]       [place], [place]  [address]       Ph: [phone]       Fax: [phone]   RxID:   [id number]

## 2010-10-10 NOTE — Assessment & Plan Note (Signed)
Summary: Gastroenterology  [facility]  MR#:  [id number] Page #  [facility]  GASTROENTEROLOGY OFFICE NOTE  NAME:  Samantha, [name]   OFFICE NO:  [id number]  DATE:  04/08/03  DOB:  02/08/2046  HISTORY OF THE PRESENT ILLNESS:  The patient complains today of excessive mucus in her throat.  She also relates episodic rectal burning and itching in her perianal area, which has been substantially improved by cream her gynecologist recently supplied to her.  She has been followed by Dr. Annalee Genta but has recently changed to another ear, nose and throat physician. However she cannot recall his name.  She does have chronic allergy problems and she is followed by Dr. Beaulah Dinning.  It appears that her ear, nose and throat physician felt that she may have pharyngeal symptoms related to gastroesophageal reflux disease and she was treated with Nexium 40 mg b.i.d. for about two months with no change in symptoms. She did have worsening epigastric pain and therefore she was changed back to Prevacid 30 mg q. day, which alleviated her epigastric pain. She does also complain of some mild hoarseness on occasion.  She relates no ongoing retrosternal burning, dysphagia or odynophagia.  She underwent colonoscopy in October of 2002, which revealed one colon polyp and she underwent upper endoscopy in March of 2000 which was entirely normal.    CURRENT MEDICATIONS:  Medications as listed on the chart, updated and reviewed.    PHYSICAL EXAMINATION:  Mildly anxious in no apparent distress.  Weight is 130 pounds.  The blood pressure is 128/68.  Pulse is 68 and regular.  Head,  eyes,  ears,  nose and throat exam revealed anicteric sclerae.  Oropharynx is clear. Neck is without thyromegaly or adenopathy.   Chest is clear to auscultation and percussion.  Cardiac revealed a regular rate and rhythm without murmurs.  Abdomen is soft, non-tender and non-distended with normal active bowel sounds.  There is no palpable organomegaly, masses or hernias.       ASSESSMENT AND PLAN:   1.  Excessive mucus in throat.  Rule out symptoms related to gastroesophageal reflux disease, inhaler side effects, or allergies.  Will change Prevacid to 30 mg b.i.d. along with all standard anti-reflux measures for the next three to four months to assess for any component of gastroesophageal reflux disease contributing to her current symptoms although it is quite likely her symptoms are multifactorial.   2.  Perianal itching.  Colonoscopy less than two years ago was unremarkable and she has had a recent exam by her gynecologist with resolution of her symptoms on her current prescription.  I have asked her to call the office to supply [place] with the name of her prescription.   3.  Return office visit with me in three to four months.     Venita Lick. Russella Dar, M.D., F.A.C.G. [id number] cc:  Romero Belling, M.D.  D:  04/08/03; T:  ; Job [id number]

## 2010-10-10 NOTE — Assessment & Plan Note (Signed)
Summary: Requests physical/$50/Cd   Vital Signs:  Patient Profile:   65 Years Old Female Weight:      141.2 pounds Temp:     97.7 degrees F oral Pulse rate:   72 / minute BP sitting:   138 / 72  (left arm) Cuff size:   regular  Pt. in pain?   no  Vitals Entered By: Orlan Leavens (May 10, 2008 8:44 AM)              Is Patient Diabetic? No     Chief Complaint:  CPX.  History of Present Illness: takes bp meds as rx'ed says she is concerned about weight gain pt says few years of nightly insomnia.  unable to cite precip factor. few days ago fell on her back, and has slight pain there    Current Allergies: ! CEFTIN ! SULFA  Past Medical History:    Reviewed history from 04/14/2007 and no changes required:       Asthma       Colonic polyps, hx of       GERD       Osteoporosis       Hiatal hernia       Degenerative joint disease       Episcleritis       Simple goiter       Urolithiasis       Fibromyalgia       dyslipidemia       Hyperglycemia       Seborrhea   Family History:    Reviewed history from 08/29/2007 and no changes required:       patient states she is concerned because her mother had polymyositis.       no cancer  Social History:    Reviewed history and no changes required:       retired       widowed    Review of Systems  The patient denies anxiety and depression.     Physical Exam  General:     well developed, well nourished, in no acute distress Head:     head is normocephalic eyes: no scleral icterus no periorbital swelling perrl external ears are normal nose normal externally mouth has no lesion, including normal tongue  Ears:     TMs intact and clear with normal canals and hearing Neck:     no masses, thyromegaly, or abnormal cervical nodes Breasts:     sees gyn  Lungs:     clear to auscultation.  no respiratory distress  Heart:     regular rate and rhythm, S1, S2 without murmurs, rubs, gallops, or  clicks Abdomen:     abdomen is soft, nontender.  no hepatosplenomegaly.   not distended.  no hernia  Rectal:     sees gyn  Genitalia:     sees gyn  Msk:     no deformity or scoliosis noted with normal posture and gait.  back is nontender Pulses:     dorsalis pedis intact bilat.  no carotid bruit  Extremities:     no deformity.  no ulcer on the feet.  feet are of normal color and temp.  no edema  Neurologic:     cn 2-12 grossly intact.   readily moves all 4's.   sensation is intact to touch on the feet  Skin:     not diaphoretic Cervical Nodes:     no significant adenopathy Psych:     alert and cooperative;  normal mood and affect; normal attention span and concentration    Impression & Recommendations:  Problem # 1:  HYPERTENSION (ICD-401.9) well-controlled  Problem # 2:  HYPERCHOLESTEROLEMIA (ICD-272.0)  Problem # 3:  back sprain  Problem # 4:  INSOMNIA (ICD-780.52)  Medications Added to Medication List This Visit: 1)  Trazodone Hcl 100 Mg Tabs (Trazodone hcl) .... Qhs  Other Orders: TLB-CBC Platelet - w/Differential (85025-CBCD)   Patient Instructions: 1)  please see a new gyn 2)  same meds 3)  labs 4)  same rx for htn 5)  i offered to order x rays of back--she declines   Prescriptions: TRAZODONE HCL 100 MG TABS (TRAZODONE HCL) qhs  #30 x 11   Entered and Authorized by:   Minus Breeding MD   Signed by:   Minus Breeding MD on 05/10/2008   Method used:   Electronically to        [facility] [address] (retail)       [address], [address]       [place], [place]  [address]       Ph: [phone]       Fax: [phone]   RxID:   [id number]  ]  Preventive Care Screening     next colonoscopy 2012

## 2010-10-10 NOTE — Progress Notes (Signed)
Summary: Rx req  Phone Note Call from Patient Call back at Home Phone [phone]   Caller: Patient Summary of Call: Pt called requesting Rx for generic prevacid 30mg  to [facility]. Pt states she has been using OTC 14mg  but it is not helping.  Initial call taken by: Margaret Pyle, CMA,  February 27, 2010 1:58 PM  Follow-up for Phone Call        sent Follow-up by: Minus Breeding MD,  February 27, 2010 2:02 PM  Additional Follow-up for Phone Call Additional follow up Details #1::        pt informed Additional Follow-up by: Margaret Pyle, CMA,  February 27, 2010 3:06 PM    Prescriptions: LANSOPRAZOLE 30 MG CPDR (LANSOPRAZOLE) 1 by mouth two times a day  #180 x 3   Entered and Authorized by:   Minus Breeding MD   Signed by:   Minus Breeding MD on 02/27/2010   Method used:   Electronically to        [facility] [address] [phone].* (retail)       [address]       [place], [place]  [address]       Ph: [phone]       Fax: [phone]   RxID:   [id number]

## 2010-10-10 NOTE — Assessment & Plan Note (Signed)
Summary: arm pain for 2 wk / ok to add on dr ellision/dd   Vital Signs:  Patient Profile:   65 Years Old Female Weight:      137 pounds Temp:     97.1 degrees F oral Pulse rate:   83 / minute BP sitting:   130 / 75  Vitals Entered By: Lamar Sprinkles (August 29, 2007 2:35 PM)                 Visit Type:  Acute Visit  Chief Complaint:  arm pain.  History of Present Illness: patient states two weeks of pain at the entire right upper extremity.  she feels it may have been caused by lifting a child, but she's not sure.  she say ortho.  pt says ortho recommended exercise and mri.  Current Allergies: ! CEFTIN ! SULFA  Past Medical History:    Reviewed history from 04/14/2007 and no changes required:       Asthma       Colonic polyps, hx of       GERD       Osteoporosis       Hiatal hernia       Degenerative joint disease       Episcleritis       Simple goiter       Urolithiasis       Fibromyalgia       dyslipidemia       Hyperglycemia       Seborrhea   Family History:    patient states she is concerned because her mother had polymyositis.    Review of Systems       denies numbness   Physical Exam  General:     healthy appearing.   Extremities:     right uppe extremity entirely normal to my pe.  no erythema/tend.  full rom at all joints of rue    Impression & Recommendations:  Problem # 1:  right upper extremity pain, uncertain etiology  Medications Added to Medication List This Visit: 1)  Xyzal 5 Mg Tabs (Levocetirizine dihydrochloride) .... Once daily 2)  Astelin 137 Mcg/spray Soln (Azelastine hcl) 3)  Ultracet 37.5-325 Mg Tabs (Tramadol-acetaminophen) .... Q4h as needed pain  Other Orders: Neurology Referral (Neuro) TLB-Sedimentation Rate (ESR) (85651-ESR) Est. Patient Level III ([address])   Patient Instructions: 1)  pncv and emg of ue's 2)  ultracet q4h prn    Prescriptions: ULTRACET 37.5-325 MG  TABS (TRAMADOL-ACETAMINOPHEN) q4h as  needed pain  #50 x 2   Entered and Authorized by:   Minus Breeding MD   Signed by:   Minus Breeding MD on 08/29/2007   Method used:   Electronically sent to ...       [facility]       [address]       [place], [place]  [address]       Ph: [phone]       Fax: [phone]   RxID:   [id number] XYZAL 5 MG  TABS (LEVOCETIRIZINE DIHYDROCHLORIDE) once daily  #30 x 11   Entered by:   Lamar Sprinkles   Authorized by:   Minus Breeding MD   Signed by:   Lamar Sprinkles on 08/29/2007   Method used:   Print then Give to Patient   RxID:   [id number]  ]

## 2010-10-10 NOTE — Assessment & Plan Note (Signed)
Summary: HEADACHES/NAUSEA/MAY WANT LAB/NWS  $50   Vital Signs:  Patient Profile:   65 Years Old Female Weight:      138.6 pounds Temp:     97.6 degrees F oral Pulse rate:   72 / minute BP sitting:   152 / 80  (left arm) Cuff size:   regular  Vitals Entered By: Orlan Leavens (January 07, 2008 10:09 AM)                 Visit Type:  Acute Visit  Chief Complaint:  HEADache/NAUSATES X'S 2 DAYS/ ALSO WANT TO CLARIFY DOSAGE FOR LOVASTATIN PT STATES SHE SHOULD BE TAKING 20MG  INSTEAD 40MG .  History of Present Illness: multiple symptoms: pt states slight headache recently, x few eeeks. pt wants to only take mevacor 20/d has few days of nasal congestion, and earache.    Current Allergies: ! CEFTIN ! SULFA  Past Medical History:    Reviewed history from 04/14/2007 and no changes required:       Asthma       Colonic polyps, hx of       GERD       Osteoporosis       Hiatal hernia       Degenerative joint disease       Episcleritis       Simple goiter       Urolithiasis       Fibromyalgia       dyslipidemia       Hyperglycemia       Seborrhea     Review of Systems  The patient denies fever.         slight blurry vision--saw opthal 14d ago   Physical Exam  General:     well developed, well nourished, in no acute distress Ears:     right tm moderately red, left is normal Mouth:     no lesion of the oral mucosa.  no erythema.  mucous membranes are moist  Lungs:     clear to auscultation.  no respiratory distress     Impression & Recommendations:  Problem # 1:  HYPERCHOLESTEROLEMIA (ICD-272.0)  The following medications were removed from the medication list:    Lovastatin 40 Mg Tabs (Lovastatin) .Marland Kitchen... Take 1 by mouth qd  Her updated medication list for this problem includes:    Mevacor 20 Mg Tabs (Lovastatin) ..... Qhs  Orders: Est. Patient Level IV ([address])   Problem # 2:  uri  Problem # 3:  heaadache  Medications Added to Medication List This  Visit: 1)  Travatan 0.004 % Soln (Travoprost) .... Use 1 drop in each eye qd 2)  Azmacort 75 Mcg/act Aers (Triamcinolone acetonide) .... Use prn 3)  Hyzaar 100-12.5 Mg Tabs (Losartan potassium-hctz) .... Qd 4)  Mevacor 20 Mg Tabs (Lovastatin) .... Qhs 5)  Zithromax 500 Mg Tabs (Azithromycin) .... Qd   Patient Instructions: 1)  increase hyzaar to 100/12.5, 1/d 2)  reduce mevacor to 20/d 3)  ret 14d 4)  zithromax 500/d x3d 5)  continue xyzal 6)  will eval headache separately if it persists    Prescriptions: ZITHROMAX 500 MG  TABS (AZITHROMYCIN) qd  #3 x 0   Entered and Authorized by:   Minus Breeding MD   Signed by:   Minus Breeding MD on 01/07/2008   Method used:   Electronically [facility] to ...       [facility] [address]       [address], [address]       [place], [place]  [address]       Ph: [phone]       Fax: [phone]   RxID:   [id number] MEVACOR 20 MG  TABS (LOVASTATIN) qhs  #30 x 11   Entered and Authorized by:   Minus Breeding MD   Signed by:   Minus Breeding MD on 01/07/2008   Method used:   Electronically sent to ...       [facility]       [address], [address]       [place], [place]  [address]       Ph: [phone]       Fax: [phone]   RxID:   [id number] XYZAL 5 MG  TABS (LEVOCETIRIZINE DIHYDROCHLORIDE) once daily  #30 x 11   Entered and Authorized by:   Minus Breeding MD   Signed by:   Minus Breeding MD on 01/07/2008   Method used:   Electronically sent to ...       [facility] [address]       [address]       [place], [place]  [address]       Ph: [phone]       Fax: [phone]   RxID:   [id number] HYZAAR 100-12.5 MG  TABS (LOSARTAN POTASSIUM-HCTZ) qd  #90 x 3   Entered and Authorized by:   Minus Breeding MD   Signed by:   Minus Breeding MD on 01/07/2008   Method used:   Print then Give to Patient   RxID:    [id number] MEVACOR 20 MG  TABS (LOVASTATIN) qhs  #90 x 03   Entered and Authorized by:   Minus Breeding MD   Signed by:   Minus Breeding MD on 01/07/2008   Method used:   Print then Give to Patient   RxID:   [id number] HYZAAR 100-12.5 MG  TABS (LOSARTAN POTASSIUM-HCTZ) qd  #90 x 3   Entered and Authorized by:   Minus Breeding MD   Signed by:   Minus Breeding MD on 01/07/2008   Method used:   Print then Give to Patient   RxID:   [id number] HYZAAR 100-12.5 MG  TABS (LOSARTAN POTASSIUM-HCTZ) qd  #30 x 11   Entered and Authorized by:   Minus Breeding MD   Signed by:   Minus Breeding MD on 01/07/2008   Method used:   Electronically sent to ...       [facility] [address]       [address]       [place], [place]  [address]       Ph: [phone]       Fax: [phone]   RxID:   [id number]  ]

## 2010-10-10 NOTE — Assessment & Plan Note (Signed)
Summary: reflux...em   History of Present Illness Visit Type: consult Primary GI MD: Elie Goody MD [facility] Primary Provider: Minus Breeding MD Requesting Provider: Minus Breeding MD Chief Complaint: Pt. c/o acid reflux, hoarseness, and chest pain.  Pt. also c/o diarrhea after eating History of Present Illness:   This is a 65 year old female that I saw last about 9 years ago. She complains of sinus problems, allegries, frequent hoarseness, laryngitis, belching, regurgitation, heartburn, chest pain, nausea, and intermittent postprandial diarrhea.  She underwent endoscopy previously in March 2000 that was negative. Colonoscopy in October 2002, showed a small leiomyoma.  She has had symptoms consistent with irritable bowel syndrome in the past. Imodium has been effective at controlling her intermittent diarrhea. Her reflux symptoms are not controlled with lansoprazole twice daily.    GI Review of Systems    Reports abdominal pain, acid reflux, belching, bloating, chest pain, heartburn, and  nausea.      Denies dysphagia with liquids, dysphagia with solids, loss of appetite, vomiting, vomiting blood, weight loss, and  weight gain.      Reports change in bowel habits, diarrhea, hemorrhoids, irritable bowel syndrome, and  rectal pain.     Denies anal fissure, black tarry stools, constipation, diverticulosis, fecal incontinence, heme positive stool, jaundice, light color stool, liver problems, and  rectal bleeding.   Current Medications (verified): 1)  Lansoprazole 30 Mg Cpdr (Lansoprazole) .Marland Kitchen.. 1 By Mouth Two Times A Day 2)  Singulair 10 Mg  Tabs (Montelukast Sodium) .... Take 1 By Mouth Qd 3)  Asmanex 120 Metered Doses 220 Mcg/inh Aepb (Mometasone Furoate) .Marland Kitchen.. 1 Puff Qd 4)  Astelin 137 Mcg/spray Soln (Azelastine Hcl) .... 2 Sprays Each Side Qd 5)  Nasacort Aq 55 Mcg/act Aers (Triamcinolone Acetonide(Nasal)) .... 2 Sprays in Each Nostril Once Daily 6)  Zyrtec Allergy 10 Mg Tabs (Cetirizine  Hcl) .... Take 1 By Mouth Qd 7)  Hyzaar 100-12.5 Mg  Tabs (Losartan Potassium-Hctz) .... Qd 8)  Mevacor 20 Mg  Tabs (Lovastatin) .... Qhs 9)  Calcium 1200 Mg Tabs (Calcium Carbonate) .... Take Qd 10)  Vitamin D 1000 Unit Tabs (Cholecalciferol) .... Take 1 By Mouth Qd 11)  Vitamin B-12 1000 Mcg Subl (Cyanocobalamin) .... Take 1 By Mouth Once Daily 12)  Vitamin C 500 Mg Tabs (Ascorbic Acid) .... Take 1 By Mouth Qd 13)  Lumigan 14)  Proventil Hfa 108 (90 Base) Mcg/act Aers (Albuterol Sulfate) .... As Needed 15)  Align  Caps (Probiotic Product) .... Once Daily 16)  Multivitamins  Tabs (Multiple Vitamin) .... Once Daily 17)  Epipen 0.3 Mg/0.72ml Devi (Epinephrine) .... For Severe Allergic Reaction 18)  Allergy Vaccine Gh New Start 19)  Zaditor 0.025 % Soln (Ketotifen Fumarate) .Marland Kitchen.. 1 Drop in Each Eye Two Times A Day As Needed 20)  Maalox Plus 225-200-25 Mg/85ml Susp (Alum & Mag Hydroxide-Simeth) .... Take As Needed 21)  Imodium A-D 2 Mg Tabs (Loperamide Hcl) .... Take As Needed For Diarrhea  Allergies (verified): 1)  ! Ceftin 2)  ! Sulfa  Past History:  Past Medical History: Asthma Episcleritis Simple goiter HYPERCHOLESTEROLEMIA (ICD-272.0) HYPERTENSION (ICD-401.9) SINUSITIS (ICD-473.9) ALLERGIC RHINITIS (ICD-477.9)- Skin test 01/11/10 INSOMNIA (ICD-780.52) FIBROIDS, UTERUS (ICD-218.9) GLAUCOMA (ICD-365.9) OSTEOPOROSIS (ICD-733.00) GERD (ICD-530.81) Colonic leiomyoma ASTHMA (ICD-493.90) Hepatitis ?Type  age 66 IBS  Past Surgical History: Nasal polypectomy and septoplasty laparoscopy- gyn Neck Injury (6045)  Stress Cardiolite (02/13/2002) EKG (12/04/2006 colon polypectomy  Family History: Reviewed history from 11/23/2009 and no changes required. patient states she is  concerned because her mother died from                           polymyositis. no cancer Father- died CHF Sister- died Sarcoid No FH of Colon Cancer:  Review of Systems       The patient complains of  allergy/sinus, shortness of breath, and voice change.         The pertinent positives and negatives are noted as above and in the HPI. All other ROS were reviewed and were negative.   Vital Signs:  Patient profile:   65 year old female Height:      63 inches Weight:      139 pounds BMI:     24.71 Pulse rate:   68 / minute Pulse rhythm:   regular BP sitting:   122 / 62  (left arm)  Vitals Entered By: Milford Cage NCMA (April 06, 2010 9:47 AM)  Physical Exam  General:  Well developed, well nourished, no acute distress. Head:  Normocephalic and atraumatic. Eyes:  PERRLA, no icterus. Ears:  Normal auditory acuity. Mouth:  No deformity or lesions, dentition normal. Lungs:  Clear throughout to auscultation. Heart:  Regular rate and rhythm; no murmurs, rubs,  or bruits. Abdomen:  Soft, nontender and nondistended. No masses, hepatosplenomegaly or hernias noted. Normal bowel sounds. Rectal:  deferred until time of colonoscopy.   Pulses:  Normal pulses noted. Extremities:  No clubbing, cyanosis, edema or deformities noted. Neurologic:  Alert and  oriented x4;  grossly normal neurologically. Cervical Nodes:  No significant cervical adenopathy. Inguinal Nodes:  No significant inguinal adenopathy. Psych:  Alert and cooperative. Normal mood and affect.  Impression & Recommendations:  Problem # 1:  GERD (ICD-530.81) She likely has GERD contributing to several of her chest and ENT symptoms. I do not feel GERD explains all of her symptoms. Need to exclude erosive esophagitis, ulcer disease, gastroparesis. Continue lansoprazole 30 mg twice daily, taken 30 minutes before breakfast and dinner. Strict antireflux measures and four-inch bed blocks. Orders: Colon/Endo (Colon/Endo) Gastric Emptying Scan (GES) 24 hour Ph Probe (24 hr Ph probe)  Problem # 2:  IRRITABLE BOWEL SYNDROME (ICD-564.1) Suspected irritable bowel syndrome. Obtain stool Hemoccults, and stool cultures. Rule out IBD. The  risks, benefits and alternatives to colonoscopy with possible biopsy and possible polypectomy were discussed with the patient and they consent to proceed. The procedure will be scheduled electively. Continue Imodium q. day p.r.n.  Problem # 3:  DIARRHEA (ICD-787.91) See problem #2. Orders: [facility] Hemoccult Cards #3 (take home) (Hem cards #3) T-Culture, Stool (87045/[id number]) T-Culture, C-Diff Toxin A/B ([address]-98119) T-Stool for Fat, Quantitative [id number]) T-Stool Fats [place] Stain [phone]) T-Stool Giardia / Crypto- EIA (62952) T-Stool for O&P (84[address]-44010) T-Fecal WBC ([address]-66440) Colon/Endo (Colon/Endo)  Patient Instructions: 1)  Get your labs drawn today in the basement.  2)  Return Hemoccult cards when finished.  3)  Colonoscopy brochure given.  4)  Upper Endoscopy brochure given.  5)  You have been scheduled for Gastric Empyting scan and 24 Ph probe study. 6)  Copy sent to : Romero Belling, MD 7)  The medication list was reviewed and reconciled.  All changed / newly prescribed medications were explained.  A complete medication list was provided to the patient / caregiver.  Prescriptions: MOVIPREP 100 GM  SOLR (PEG-KCL-NACL-NASULF-NA ASC-C) As per prep instructions.  #1 x 0   Entered by:   Christie Nottingham CMA (AAMA)   Authorized by:  Meryl Dare MD [facility]   Signed by:   Meryl Dare MD FACG on 04/06/2010   Method used:   Electronically to        [facility] [phone].* (retail)       [address]       [place], [place]  [address]       Ph: [phone]       Fax: [phone]   RxID:   [id number]

## 2010-10-10 NOTE — Progress Notes (Signed)
Summary: sick-requesting appt.  Phone Note Call from Patient Call back at Home Phone [phone]   Caller: Patient Call For: young Reason for Call: Talk to Nurse Summary of Call: Patient requesting appt. w/ Dr. Maple Hudson.  Complains of congestion, sore throat, cough, and low grade fever. Symtoms started Friday. Initial call taken by: Lehman Prom,  July 17, 2010 8:20 AM  Follow-up for Phone Call        Appt Scheduled Today Follow-up by: Vernie Murders,  July 17, 2010 9:02 AM

## 2010-10-10 NOTE — Assessment & Plan Note (Signed)
Summary: cough/sore throat//lmr   Primary Provider/Referring Provider:  Minus Breeding MD  CC:  Acute visit-cough-productive dark yellow with red tint; slight fever yesterday, Lsided face/ear pain, and sore throat.Marland Kitchen  History of Present Illness:  March 06, 2010- Allergic rhinitis, asthma, GERD Using rescue inhaler several times daily x 3 weeks for vague dyspnea without wheeze. Coughs up thick clear mucus. Does saline nasal rinse. Soreness midsternal level, seemingly around right lateral chest to mid back. Complains church too cold and going in and out of heat makes her worse. throat clearing. Increased her acid blocker to two times a day - helps. Sharp pains both temples. Building allergy vaccine.  May 01, 2010- Allergic rhinitis, asthma, GERD CXR- reviewed with her- LVP, no CHF. Prom markings c/w asthmatic bronhchitis, NAD. She tolerated EGD/ Colonoscopy. Found diverticulosis.  She asks about med changes. Concerned that inhaled steroids may be aggravating her esophageal irritation. Astelin is also irritating. Fall and Winter are her worst seasons.  July 17, 2010- Allergic rhinitis, asthma, GERD Nurse-CC: Acute visit-cough-productive dark yellow with red tint; slight fever yesterday,Lsided face/ear pain, sore throat. She had been doing "great since I started those allergy shots" until sick exposure and she got ill herself this weekend. Now nasal congestion, ear aches, productive cough thick deep yellow with  blood streaks. Sleeping with adjustable bed, elevated HOB and says this has also helped by reducing reflux.   Planning rheumatology appointment for suspected fibromyalgia.   Asthma History    Asthma Control Assessment:    Age range: 12+ years    Symptoms: 0-2 days/week    Nighttime Awakenings: 0-2/month    Interferes w/ normal activity: no limitations    SABA use (not for EIB): 0-2 days/week    Asthma Control Assessment: Well Controlled   Preventive Screening-Counseling  & Management  Alcohol-Tobacco     Smoking Status: never  Current Medications (verified): 1)  Lansoprazole 30 Mg Cpdr (Lansoprazole) .Marland Kitchen.. 1 By Mouth Two Times A Day 2)  Singulair 10 Mg  Tabs (Montelukast Sodium) .... Take 1 By Mouth Qd 3)  Cetirizine Hcl 10 Mg Tabs (Cetirizine Hcl) .Marland Kitchen.. 1 By Mouth Once Daily 4)  Hyzaar 100-12.5 Mg  Tabs (Losartan Potassium-Hctz) .... Qd 5)  Mevacor 20 Mg  Tabs (Lovastatin) .... Qhs 6)  Calcium 600mg /vitamin D/magnesium Tablets .... Take 2 By Mouth Once Daily 7)  B-Complex/b-12  Tabs (B Complex Vitamins) .... Once Daily 8)  Vitamin C 500 Mg Tabs (Ascorbic Acid) .... Take 1 By Mouth Qd 9)  Lumigan 10)  Proventil Hfa 108 (90 Base) Mcg/act Aers (Albuterol Sulfate) .... As Needed 11)  Align  Caps (Probiotic Product) .... Once Daily 12)  Multivitamins  Tabs (Multiple Vitamin) .... Once Daily 13)  Epipen 0.3 Mg/0.62ml Devi (Epinephrine) .... For Severe Allergic Reaction 14)  Allergy Vaccine Gh New Start 15)  Zaditor 0.025 % Soln (Ketotifen Fumarate) .Marland Kitchen.. 1 Drop in Each Eye Two Times A Day As Needed 16)  Maalox Plus 225-200-25 Mg/39ml Susp (Alum & Mag Hydroxide-Simeth) .... Take As Needed 17)  Nasalcrom 5.2 Mg/act Aers (Cromolyn Sodium) .Marland Kitchen.. 1-2 Sprays Each Nostri.qid As Needed  Allergies (verified): 1)  ! Ceftin 2)  ! Sulfa  Past History:  Past Medical History: Last updated: 04/06/2010 Asthma Episcleritis Simple goiter HYPERCHOLESTEROLEMIA (ICD-272.0) HYPERTENSION (ICD-401.9) SINUSITIS (ICD-473.9) ALLERGIC RHINITIS (ICD-477.9)- Skin test 01/11/10 INSOMNIA (ICD-780.52) FIBROIDS, UTERUS (ICD-218.9) GLAUCOMA (ICD-365.9) OSTEOPOROSIS (ICD-733.00) GERD (ICD-530.81) Colonic leiomyoma ASTHMA (ICD-493.90) Hepatitis ?Type  age 77 IBS  Past Surgical History: Last updated:  04/06/2010 Nasal polypectomy and septoplasty laparoscopy- gyn Neck Injury (1610)  Stress Cardiolite (02/13/2002) EKG (12/04/2006 colon polypectomy  Family History: Last updated:  04/06/2010 patient states she is concerned because her mother died from                           polymyositis. 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   Risk Factors: Smoking Status: never (07/17/2010)  Review of Systems      See HPI       The patient complains of non-productive cough, nasal congestion/difficulty breathing through nose, and sneezing.  The patient denies shortness of breath with activity, shortness of breath at rest, productive cough, coughing up blood, chest pain, irregular heartbeats, acid heartburn, indigestion, loss of appetite, weight change, abdominal pain, difficulty swallowing, sore throat, tooth/dental problems, and headaches.    Vital Signs:  Patient profile:   65 year old female Height:      63 inches Weight:      141 pounds BMI:     25.07 O2 Sat:      100 % on Room air Pulse rate:   81 / minute BP sitting:   122 / 78  (left arm) Cuff size:   regular  Vitals Entered By: Reynaldo Minium CMA (July 17, 2010 10:22 AM)  O2 Flow:  Room air CC: Acute visit-cough-productive dark yellow with red tint; slight fever yesterday,Lsided face/ear pain, sore throat.   Physical Exam  Additional Exam:  General: A/Ox3; pleasant and cooperative, SKIN: pale birthmark left cheek NODES: no lymphadenopathy HEENT: [place]/AT,  Periorbital edema, EOM- WNL, Conjuctivae- clear, PERRLA, TM-retraction, Nose- turbinate edema, sniffing, Throat- clear and wnl, Mallampati  III, frequent throat clearing- less than last visit Not red or cobblestoned. NECK: Supple w/ fair ROM, JVD- none, normal carotid impulses w/o bruits Thyroid-  CHEST: Clear to P&A, no cough or wheeze. HEART: RRR, no m/g/r heard ABDOMEN: Soft and nl;  RUE:AVWU, nl pulses, no edema  NEURO: Grossly intact to observation      Impression & Recommendations:  Problem # 1:   ALLERGIC RHINITIS (ICD-477.9)  Good control and she reports allergy vaccine helping. Her updated medication list for this problem includes:    Cetirizine Hcl 10 Mg Tabs (Cetirizine hcl) .Marland Kitchen... 1 by mouth once daily    Nasalcrom 5.2 Mg/act Aers (Cromolyn sodium) .Marland Kitchen... 1-2 sprays each nostri.qid as needed  Problem # 2:  ASTHMA (ICD-493.90) asthmatic bronchitis with viral syndrome exacerbation. She is dealing with disconmforts that she considers fibromyalgia pending rheumatology appointment. We offered staeroid injection today. Try Z pal for atypicals.   Medications Added to Medication List This Visit: 1)  Zithromax Z-pak 250 Mg Tabs (Azithromycin) .... 2 today then one daily  Other Orders: Est. Patient Level IV (98119) Prescription Created Electronically [phone]) Admin of Therapeutic Inj  intramuscular or subcutaneous (95621) Depo- Medrol 80mg  (J1040)  Patient Instructions: 1)  Please schedule a follow-up appointment in 6 months. 2)  depo 80 3)  Script Z pak sent to drug store Prescriptions: ZITHROMAX Z-PAK 250 MG TABS (AZITHROMYCIN) 2 today then one daily  #1 pak x 0   Entered and Authorized by:   Waymon Budge MD   Signed by:   Waymon Budge MD on 07/17/2010   Method used:   Electronically to  [facility]  [place] #[id number] (retail)       [address]       [place], [place]  [address]       Ph: [phone]       Fax: [phone]   RxID:   [id number]      Medication Administration  Injection # 1:    Medication: Depo- Medrol 80mg     Diagnosis: ASTHMA (ICD-493.90)    Route: SQ    Site: RUOQ gluteus    Exp Date: 02/2013    Lot #: [id number]    Mfr: Pharmacia    Patient tolerated injection without complications    Given by: Reynaldo Minium CMA (July 17, 2010 3:56 PM)  Orders Added: 1)  Est. Patient Level IV [06237] 2)  Prescription Created Electronically [G8553] 3)  Admin of Therapeutic Inj  intramuscular or subcutaneous  [96372] 4)  Depo- Medrol 80mg  [J1040]

## 2010-10-10 NOTE — Assessment & Plan Note (Signed)
Summary: ?SINUS INFECTION / $50 / CD   Vital Signs:  Patient Profile:   65 Years Old Female Weight:      140.0 pounds O2 Sat:      97 % O2 treatment:    Room Air Temp:     97.6 degrees F oral Pulse rate:   86 / minute BP sitting:   130 / 68  (left arm) Cuff size:   regular  Pt. in pain?   no  Vitals Entered By: Orlan Leavens (August 25, 2008 10:48 AM)                  Chief Complaint:  ? SINUS INFECTION.  History of Present Illness: pt states bilat earache has recurred.  also has purulent rhinorrhea.  pt says she is not allergic to amoxil.    Prior Medications Reviewed Using: Patient Recall  Updated Prior Medication List: CALTRATE 600+D 600-400 MG-UNIT  TABS (CALCIUM CARBONATE-VITAMIN D) take 1 by mouth qd PREVACID 30 MG  CPDR (LANSOPRAZOLE) take 1 by mouth qd SINGULAIR 10 MG  TABS (MONTELUKAST SODIUM) take 1 by mouth qd COD LIVER OIL   CAPS (COD LIVER OIL) take 1 by mouth qd XYZAL 5 MG  TABS (LEVOCETIRIZINE DIHYDROCHLORIDE) once daily ALBUTEROL 90 MCG/ACT  AERS (ALBUTEROL) use prn TRAVATAN 0.004 %  SOLN (TRAVOPROST) USE 1 DROP IN EACH EYE QD HYZAAR 100-12.5 MG  TABS (LOSARTAN POTASSIUM-HCTZ) qd MEVACOR 20 MG  TABS (LOVASTATIN) qhs NORVASC 2.5 MG  TABS (AMLODIPINE BESYLATE) qd TRAZODONE HCL 100 MG TABS (TRAZODONE HCL) qhs ASTELIN 137 MCG/SPRAY SOLN (AZELASTINE HCL) 2 sprays each side qd ASMANEX 120 METERED DOSES 220 MCG/INH AEPB (MOMETASONE FUROATE) 1 puff qd  Current Allergies (reviewed today): ! CEFTIN ! SULFA  Past Medical History:    Reviewed history from 04/14/2007 and no changes required:       Asthma       Colonic polyps, hx of       GERD       Osteoporosis       Hiatal hernia       Degenerative joint disease       Episcleritis       Simple goiter       Urolithiasis       Fibromyalgia       dyslipidemia       Hyperglycemia       Seborrhea     Review of Systems  The patient denies fever.     Physical Exam  General:     well developed,  well nourished, in no acute distress Head:     head is normocephalic eyes: no scleral icterus no periorbital swelling perrl external ears are normal nose normal externally mouth has no lesion, including normal tongue  Ears:     right tm is red, left is normal    Impression & Recommendations:  Problem # 1:  URI (ICD-465.9)  Medications Added to Medication List This Visit: 1)  Augmentin 500-125 Mg Tabs (Amoxicillin-pot clavulanate) .... Tid   Patient Instructions: 1)  augmentin 500 tid 2)  please see dr Beaulah Dinning or [facility]ers if not better by next week   Prescriptions: AUGMENTIN 500-125 MG TABS (AMOXICILLIN-POT CLAVULANATE) tid  #21 x 0   Entered and Authorized by:   Minus Breeding MD   Signed by:   Minus Breeding MD on 08/25/2008   Method used:   Electronically to        [facility] [address] (  retail)       [address], [address]       [place], [place]  [address]       Ph: [phone]       Fax: [phone]   RxID:   [id number]  ]  Influenza Immunization History:    Influenza # 1:  Historical (08/18/2008) GIVEN AT [facility] DELTOID LOT # [id number]

## 2010-10-10 NOTE — Letter (Signed)
Summary: ELAM Consult Scheduled Letter  [facility]  [address] [place], [place] [address]   Phone: [phone]  Fax: [phone]    09/01/2007 MRN: [id number]    Dear Ms. Delford Field,      We have scheduled an appointment for you.  At the recommendation of Dr. Everardo All, we have scheduled you a test with [facility] on September 22, 2007 at 10:30am.  Their phone number is [phone].  If this appointment day and time is not convenient for you, please feel free to call the office of the doctor you are being referred to at the number listed above and reschedule the appointment.  Nerve Conduction Study & Electromyography(upper extremities) [facility] [address] [place], [place] [address]  Please arrive 30 minutes prior to your appointment   Thank you,  Patient Care Coordinator [facility]

## 2010-10-10 NOTE — Progress Notes (Signed)
Summary: results  Phone Note Call from Patient Call back at Home Phone [phone]   Caller: [phone] Call For: Minus Breeding MD Summary of Call: per pt stated she have not receive her bone density report lab not on phone tree nothing recorede Initial call taken by: Shelbie Proctor,  May 25, 2008 1:17 PM  Follow-up for Phone Call        i need results Follow-up by: Minus Breeding MD,  May 25, 2008 5:54 PM  Additional Follow-up for Phone Call Additional follow up Details #1::        May 26, 2008 8:55 AM spoke with christan in bone density  stated she received alot of results  yesterday will check to seeif there are back, also states  it a 2- 3 week wait period Additional Follow-up by: Shelbie Proctor,  May 26, 2008 8:57 AM    Additional Follow-up for Phone Call Additional follow up Details #2::    noted, thank you Follow-up by: Minus Breeding MD,  May 26, 2008 12:21 PM

## 2010-10-10 NOTE — Letter (Signed)
Summary: Patient Notice-Hyperplastic Polyps  [facility]  [address] [place], [place] [address]   Phone: [phone]  Fax: [phone]        April 25, 2010 MRN: [id number]    [facility] [address] [place], [place]  [address]    Dear Ms. RAINE,  I am pleased to inform you that the colon polyp(s) removed during your recent colonoscopy was (were) found to be a BENIGN FIBROBLASTIC POLYP.  These types of polyps are NOT pre-cancerous.  It is my recommendation that you have a repeat colonoscopy examination in 10 years for routine colorectal cancer screening.  Should you develop new or worsening symptoms of abdominal pain, bowel habit changes or bleeding from the rectum or bowels, please schedule an evaluation with either your primary care physician or with me.  Continue treatment plan as outlined the day of your exam.  Please call us if you are having persistent problems or have questions about your condition that have not been fully answered at this time.  Sincerely,  Meryl Dare MD [facility]  This letter has been electronically signed by your physician.  Appended Document: Patient Notice-Hyperplastic Polyps letter mailed

## 2010-10-10 NOTE — Procedures (Signed)
Summary: Upper Endoscopy  Patient: Samantha [name] Note: All result statuses are Final unless otherwise noted.  Tests: (1) Upper Endoscopy (EGD)   EGD Upper Endoscopy       DONE     [facility]     [address]     [place], [place]  [address]           ENDOSCOPY PROCEDURE REPORT           PATIENT:  Samantha [name], Samantha [name]  MR#:  [id number]     BIRTHDATE:  06-21-1946, 63 yrs. old  GENDER:  female     ENDOSCOPIST:  Judie Petit T. Russella Dar, MD, [facility]           PROCEDURE DATE:  04/21/2010     PROCEDURE:  EGD with biopsy     ASA CLASS:  Class II     INDICATIONS:  GERD, chest pain     MEDICATIONS:  There was residual sedation effect present from     prior procedure, Fentanyl 25 mcg IV, Versed 2 mg IV     TOPICAL ANESTHETIC:  Exactacain Spray     DESCRIPTION OF PROCEDURE:   After the risks benefits and     alternatives of the procedure were thoroughly explained, informed     consent was obtained.  The LB [id number]-[id number] endoscope was     introduced through the mouth and advanced to the second portion of     the duodenum, without limitations.  The instrument was slowly     withdrawn as the mucosa was fully examined.     <<PROCEDUREIMAGES>>     A Schatzki's ring was found at the gastroesophageal junction.     Multiple biopsies were obtained and sent to pathology.  White     exudate in the distal and proximal esophagus. R/O candida.     Multiple biopsies were obtained in the distal esophagus and sent     to pathology. The stomach was entered and closely examined. The     pylorus,  antrum, angularis, and lesser curvature were well     visualized, including a retroflexed view of the cardia and fundus.     The stomach wall was normally distensable. The scope passed easily     through the pylorus into the duodenum. The duodenal bulb was     normal in appearance, as was the postbulbar duodenum. Retroflexed     views revealed a hiatal hernia, small. The scope was then     withdrawn from the patient  and the procedure completed.           COMPLICATIONS:  None           ENDOSCOPIC IMPRESSION:     1) Schatzki's ring     2) Esophageal exudate     3) Small hiatal hernia           RECOMMENDATIONS:     1) Anti-reflux regimen     2) Await pathology results     3) continue PPI     4) fluconazole 100mg  po qd, #10, no refills           Din Bookwalter T. Russella Dar, MD, [facility]           n.     eSIGNED:   Venita Lick. Natalyn Szymanowski at 04/21/2010 03:03 PM           Adalberto Ill, [id number]  Note: An exclamation mark (!) indicates a result that was not dispersed into the flowsheet. Document Creation  Date: 04/21/2010 3:05 PM _______________________________________________________________________  (1) Order result status: Final Collection or observation date-time: 04/21/2010 15:00 Requested date-time:  Receipt date-time:  Reported date-time:  Referring Physician:   Ordering Physician: Claudette Head [phone]) Specimen Source:  Source: Launa Grill Order Number: [id number] Lab site:

## 2010-10-10 NOTE — Progress Notes (Signed)
Summary: medication questions  Phone Note Call from Patient Call back at Home Phone [phone]   Caller: Patient Call For: young Summary of Call: Wants to talk to nurse about her medications. Initial call taken by: Darletta Moll,  April 25, 2010 9:01 AM  Follow-up for Phone Call        Has concerns about her meds(inhalers and steroids) the effects they have on her throat and trush;see endo report for more information. Pt to come in 05-01-10 at 1130am to discuss with CDY.Reynaldo Minium CMA  April 25, 2010 9:19 AM

## 2010-10-10 NOTE — Letter (Signed)
Summary: [facility]  [facility]   Imported By: Sherian Rein 11/03/2009 10:17:51  _____________________________________________________________________  External Attachment:    Type:   Image     Comment:   External Document

## 2010-10-10 NOTE — Assessment & Plan Note (Signed)
Summary: ALLERGIES/SINUS/APC   Primary Provider/Referring Provider:  Minus Breeding MD   History of Present Illness: November 23, 2009-  63 yoF self-referred asking management of allergies and asthma. She had seen Dr Alwyn Ren and Dr Beaulah Dinning for these in the past. Her brother is a patient here and she decided toswitch because it was time for allergy re-test. Complains of postnasal driop, sneeze and itch For the past 3 years she has had intervals of itching eyes, headache, retroorbital pressure, sorethroats and laryngitis. In some years October throught the winterhas been worst.She has been on allergy vaccine years ago with benefit. Has needed prednisone for winter bronchitis. Hx nasal polypectomy and septoplasty- Dr Jearld Fenton. Had pneumonia with sinusitis in 2 Had flu vax but never pneumovax. Hiatal hernia withh GERD  Allergies: 1)  ! Ceftin 2)  ! Sulfa  Past History:  Past Medical History: Last updated: 04/14/2007 Asthma Colonic polyps, hx of GERD Osteoporosis Hiatal hernia Degenerative joint disease Episcleritis Simple goiter Urolithiasis Fibromyalgia dyslipidemia Hyperglycemia Seborrhea  Family History: Last updated: 11/23/2009 patient states she is concerned because her mother died from                           polymyositis. 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  The patient denies anorexia, fever, weight loss, weight gain, vision loss, decreased hearing, hoarseness, chest pain, syncope, peripheral edema, and headaches.         Wakes self snoring  Physical Exam  Additional Exam:  General: A/Ox3; pleasant and cooperative, NAD, talkative SKIN: plae birthmark left cheek NODES: no lymphadenopathy HEENT: [place]/AT,  Periorbital edema, EOM- WNL, Conjuctivae- clear, PERRLA, TM-right canal redL, Nose- clear, Throat- clear and wnl NECK: Supple w/ fair ROM, JVD- none, normal carotid impulses w/o bruits Thyroid- normal to palpation CHEST: Clear to P&A HEART: RRR, no m/g/r heard ABDOMEN: Soft and nl; nml bowel sounds; no organomegaly or masses noted ASN:KNLZ, nl pulses, no edema  NEURO: Grossly intact to observation      Impression & Recommendations:  Problem # 1:  ALLERGIC RHINITIS (ICD-477.9) We will get RAST now and bring her back for skin testing off antihistamines as requested. The following medications were removed from the medication list:    Nasonex 50 Mcg/act Susp (Mometasone furoate) .Marland Kitchen... 1 spray each nostril each day Her updated medication list for this problem includes:    Astelin 137 Mcg/spray Soln (Azelastine hcl) .Marland Kitchen... 2 sprays each side qd    Nasacort Aq 55 Mcg/act Aers (Triamcinolone acetonide(nasal)) ..... Use as needed    Zyrtec Allergy 10 Mg Tabs (Cetirizine hcl) .Marland Kitchen... Take 1 by mouth qd  Problem # 2:  ASTHMA (ICD-493.90) Well contolled on Asmanex now with infrequent need  for rescue inhaler. We will schedule PFT. Pneumonia vaccine  Problem # 3:  INSOMNIA (ICD-780.52) We did initiall discussion of sleep hygiene. We will offer trial temazepam for now.  Other Orders: New Patient Level IV ([address]) T-"RAST" (Allergy Full Profile) IGE ([address]272) Pneumococcal Vaccine (53664) Admin 1st Vaccine ([address])  Patient Instructions: 1)  Return as able for allergy  skjn testing-  Stop all antihistamines 3 days before skin testing, including cold and allergy meds, otc sleep and cough meds. This includes Astelin and Zyrtec. 2)  Lab 3)  Schedule PFT 4)  Pneumovax   Immunizations Administered:  Pneumonia Vaccine:    Vaccine Type: Pneumovax    Site: left deltoid    Mfr: Merck    Dose: 0.5 ml    Route: IM    Given by: Vivianne Spence    Exp. Date: 04/24/2011    Lot #: [id number]

## 2010-10-10 NOTE — Progress Notes (Signed)
Summary: procedure ?  Phone Note Call from Patient Call back at Home Phone [phone]   Caller: Patient Call For: Dr. Russella Dar Reason for Call: Talk to Nurse Summary of Call: procedure ? Initial call taken by: Vallarie Mare,  April 18, 2010 10:33 AM  Follow-up for Phone Call        Forgot and ate  about  10 nuts yesterday .Asking if she needs to r/s procedure.Pt. informed to keep appt. but not to eat anymore and follow diet instructions. Follow-up by: Teryl Lucy RN,  April 18, 2010 11:08 AM  Additional Follow-up for Phone Call Additional follow up Details #1::        OK to keep scheduled procedure. Additional Follow-up by: Meryl Dare MD [facility],  April 18, 2010 11:26 AM

## 2010-10-12 NOTE — Progress Notes (Signed)
Summary: RF  Phone Note Refill Request Call back at Home Phone [phone] Call back at [phone]   Refills Requested: Medication #1:  HYZAAR 100-12.5 MG  TABS qd  Medication #2:  MEVACOR 20 MG  TABS qhs REQ TO PICK UP RX FOR 90 DAY SUPPLY   Method Requested: Pick up at Office Next Appointment Scheduled: none Initial call taken by: Lamar Sprinkles, CMA,  August 28, 2010 10:02 AM  Follow-up for Phone Call        pt wants to mail in rx's to [facility] mail order service-rx waiting for MD's signature Follow-up by: Brenton Grills CMA Duncan Dull),  August 28, 2010 10:15 AM  Additional Follow-up for Phone Call Additional follow up Details #1::        rx placed upfront in cabinet, pt informed Additional Follow-up by: Brenton Grills CMA Duncan Dull),  August 28, 2010 10:31 AM    Prescriptions: MEVACOR 20 MG  TABS (LOVASTATIN) qhs  #90 x 3   Entered by:   Brenton Grills CMA (AAMA)   Authorized by:   Minus Breeding MD   Signed by:   Brenton Grills CMA (AAMA) on 08/28/2010   Method used:   Print then Give to Patient   RxID:   [id number] 100-12.5 MG  TABS (LOSARTAN POTASSIUM-HCTZ) qd  #90 x 3   Entered by:   Brenton Grills CMA (AAMA)   Authorized by:   Minus Breeding MD   Signed by:   Brenton Grills CMA (AAMA) on 08/28/2010   Method used:   Print then Give to Patient   RxID:   [id number]

## 2010-10-12 NOTE — Assessment & Plan Note (Signed)
Summary: PT SICK/CONGESTION/[facility]   Primary Provider/Referring Provider:  Minus Breeding MD  CC:  Acute visit-congestion increased; sore throat last week. Recently around someone with bronchits and walking PNA as well as granddaoughter recently had mono.Samantha [name]  History of Present Illness:  May 01, 2010- Allergic rhinitis, asthma, GERD CXR- reviewed with her- LVP, no CHF. Prom markings c/w asthmatic bronhchitis, NAD. She tolerated EGD/ Colonoscopy. Found diverticulosis.  She asks about med changes. Concerned that inhaled steroids may be aggravating her esophageal irritation. Astelin is also irritating. Fall and Winter are her worst seasons.  July 17, 2010- Allergic rhinitis, asthma, GERD Nurse-CC: Acute visit-cough-productive dark yellow with red tint; slight fever yesterday,Lsided face/ear pain, sore throat. She had been doing "great since I started those allergy shots" until sick exposure and she got ill herself this weekend. Now nasal congestion, ear aches, productive cough thick deep yellow with  blood streaks. Sleeping with adjustable bed, elevated HOB and says this has also helped by reducing reflux.   Planning rheumatology appointment for suspected fibromyalgia.   September 05, 2010-  Allergic rhinitis, asthma, GERD Nurse-CC: Acute visit-congestion increased; sore throat last week. Recently around someone with bronchits and walking PNA as well as granddaughter recently had mono. Around sick grandchild with Mono. 4 days ago began with sore throat, stuffy nose, increasing cough with thick clear mucus. Denies fever or swollen glands. No GI upset. Chest feels a little tight- using her rescue inhaler some. Using capsaicin nasal spray for sinus pain.      Asthma History    Asthma Control Assessment:    Age range: 12+ years    Symptoms: 0-2 days/week    Nighttime Awakenings: 0-2/month    Interferes w/ normal activity: no limitations    SABA use (not for EIB): 0-2 days/week    Asthma  Control Assessment: Well Controlled    Preventive Screening-Counseling & Management  Alcohol-Tobacco     Smoking Status: never  Current Medications (verified): 1)  Lansoprazole 30 Mg Cpdr (Lansoprazole) .Samantha [name].. 1 By Mouth Two Times A Day 2)  Singulair 10 Mg  Tabs (Montelukast Sodium) .... Take 1 By Mouth Qd 3)  Cetirizine Hcl 10 Mg Tabs (Cetirizine Hcl) .Samantha [name].. 1 By Mouth Once Daily 4)  Hyzaar 100-12.5 Mg  Tabs (Losartan Potassium-Hctz) .... Qd 5)  Mevacor 20 Mg  Tabs (Lovastatin) .... Qhs 6)  Calcium 600mg /vitamin D/magnesium Tablets .... Take 2 By Mouth Once Daily 7)  B-Complex/b-12  Tabs (B Complex Vitamins) .... Once Daily 8)  Vitamin C 500 Mg Tabs (Ascorbic Acid) .... Take 1 By Mouth Qd 9)  Proventil Hfa 108 (90 Base) Mcg/act Aers (Albuterol Sulfate) .... As Needed 10)  Align  Caps (Probiotic Product) .... Once Daily 11)  Multivitamins  Tabs (Multiple Vitamin) .... Once Daily 12)  Epipen 0.3 Mg/0.1ml Devi (Epinephrine) .... For Severe Allergic Reaction 13)  Allergy Vaccine Gh New Start 14)  Maalox Plus 225-200-25 Mg/18ml Susp (Alum & Mag Hydroxide-Simeth) .... Take As Needed 15)  Nasalcrom 5.2 Mg/act Aers (Cromolyn Sodium) .Samantha [name].. 1-2 Sprays Each Nostri.qid As Needed  Allergies (verified): 1)  ! Ceftin 2)  ! Sulfa  Past History:  Past Medical History: Last updated: 04/06/2010 Asthma Episcleritis Simple goiter HYPERCHOLESTEROLEMIA (ICD-272.0) HYPERTENSION (ICD-401.9) SINUSITIS (ICD-473.9) ALLERGIC RHINITIS (ICD-477.9)- Skin test 01/11/10 INSOMNIA (ICD-780.52) FIBROIDS, UTERUS (ICD-218.9) GLAUCOMA (ICD-365.9) OSTEOPOROSIS (ICD-733.00) GERD (ICD-530.81) Colonic leiomyoma ASTHMA (ICD-493.90) Hepatitis ?Type  age 67 IBS  Past Surgical History: Last updated: 04/06/2010 Nasal polypectomy and septoplasty laparoscopy- gyn Neck Injury (1983)  Stress Cardiolite (02/13/2002) EKG (12/04/2006 colon polypectomy  Family History: Last updated: 04/06/2010 patient states she is  concerned because her mother died from                           polymyositis. 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   Risk Factors: Smoking Status: never (09/05/2010)  Review of Systems      See HPI       The patient complains of nasal congestion/difficulty breathing through nose.  The patient denies shortness of breath with activity, shortness of breath at rest, productive cough, non-productive cough, coughing up blood, chest pain, irregular heartbeats, acid heartburn, indigestion, loss of appetite, weight change, abdominal pain, difficulty swallowing, sore throat, tooth/dental problems, headaches, and sneezing.    Vital Signs:  Patient profile:   65 year old female Height:      63 inches Weight:      139.38 pounds BMI:     24.78 O2 Sat:      99 % on Room air Pulse rate:   79 / minute BP sitting:   122 / 62  (left arm) Cuff size:   regular  Vitals Entered By: Reynaldo Minium CMA (September 05, 2010 2:57 PM)  O2 Flow:  Room air CC: Acute visit-congestion increased; sore throat last week. Recently around someone with bronchits and walking PNA as well as granddaoughter recently had mono.   Physical Exam  Additional Exam:  General: A/Ox3; pleasant and cooperative, SKIN: pale birthmark left cheek NODES: no lymphadenopathy HEENT: [place]/AT,  Periorbital edema, EOM- WNL, Conjuctivae- clear, PERRLA, TM-retraction, Nose- moderate.turbinate edema, sniffing, Throat- clear and wnl, Mallampati  III, frequent throat clearing- less than last visit Not red or cobblestoned. Hoarse NECK: Supple w/ fair ROM, JVD- none, normal carotid impulses w/o bruits Thyroid-  CHEST: Clear to P&A, no cough or wheeze. HEART: RRR, no m/g/r heard ABDOMEN: Soft and nl;  ZOX:WRUE, nl pulses, no edema  NEURO: Grossly intact to observation      Impression &  Recommendations:  Problem # 1:  ALLERGIC RHINITIS (ICD-477.9)  Acute rhinosinusitis. Using saline rinse now.  She has built her allergy vaccine w/o problems to weekly maintenance.  We will give doxycycline.  Her updated medication list for this problem includes:    Cetirizine Hcl 10 Mg Tabs (Cetirizine hcl) .Samantha [name]... 1 by mouth once daily    Nasalcrom 5.2 Mg/act Aers (Cromolyn sodium) .Samantha [name]... 1-2 sprays each nostri.qid as needed  Problem # 2:  ASTHMA (ICD-493.90) Overall control has been good without exacerbation so far from her nasal process.   Medications Added to Medication List This Visit: 1)  Augmentin 500-125 Mg Tabs (Amoxicillin-pot clavulanate) .Samantha [name].. 1 two times a day  Other Orders: Est. Patient Level III (45409) Nebulizer Tx (81191)  Patient Instructions: 1)  Keep May appointment 2)  script for augmentin sent to your drug store 3)  Continue allergy vaccine 4)  neb neonasal Prescriptions: AUGMENTIN 500-125 MG TABS (AMOXICILLIN-POT CLAVULANATE) 1 two times a day  #14 x 0   Entered and Authorized by:   Waymon Budge MD   Signed by:   Waymon Budge MD on 09/05/2010   Method used:   Electronically to        [facility] [address] [phone].* (retail)       [address] [facility]  [address]       [place], [place]  [address]       Ph: [phone]       Fax: [phone]   RxID:   [id number]      Medication Administration  Medication # 1:    Medication: EMR miscellaneous medications    Diagnosis: ALLERGIC RHINITIS (ICD-477.9)    Dose: 3 drops    Route: intranasal    Exp Date: 04/13    Lot #: [id number]    Mfr: bayer    Comments: neo-synephrine    Patient tolerated medication without complications    Given by: Carver Fila (September 05, 2010 3:39 PM)  Orders Added: 1)  Est. Patient Level III [53664] 2)  Nebulizer Tx [phone]

## 2010-10-12 NOTE — Progress Notes (Signed)
Summary: some better but will give abx more time to work  Phone Note Call from Patient Call back at Home Phone [phone]   Call For: dr. Maple Hudson Summary of Call: Patient phoned stated that she just got off the phone with [facility] and was told that if she needed the steroid shot to let her know. I called Katie and per Florentina Addison she stated that she told Ms Goostree when she comes in on Tuesday for her allergy shot to advise [place] if she wasn't better, but for me to advise the patient that she needed to give the antibiotic time to work. Pt voiced understanding Initial call taken by: Vedia Coffer,  September 08, 2010 10:16 AM  Follow-up for Phone Call        Pt states she is some better.  Only took 1 dose of abx on Tuesday and 2 doses on Wed and Thurs.  Will give abx more time to work but if no improvement on Tuesday, will let us know.   Follow-up by: Gweneth Dimitri RN,  September 08, 2010 10:38 AM  Additional Follow-up for Phone Call Additional follow up Details #1::        Noted and pt knows to let me know on Tuesday if no better.Reynaldo Minium CMA  September 08, 2010 10:43 AM

## 2010-10-12 NOTE — Letter (Signed)
Summary: Medical Alert Certificate/[employer]  Medical Alert Certificate/[employer]   Imported By: Sherian Rein 09/14/2010 11:14:44  _____________________________________________________________________  External Attachment:    Type:   Image     Comment:   External Document

## 2010-10-13 NOTE — Procedures (Signed)
Summary: EGD/[facility]  EGD/[facility]   Imported By: Sherian Rein 04/07/2010 09:45:01  _____________________________________________________________________  External Attachment:    Type:   Image     Comment:   External Document

## 2010-10-13 NOTE — Procedures (Signed)
Summary: [employer]   Imported By: Sherian Rein 04/07/2010 09:42:56  _____________________________________________________________________  External Attachment:    Type:   Image     Comment:   External Document

## 2010-10-19 DIAGNOSIS — J301 Allergic rhinitis due to pollen: Secondary | ICD-10-CM

## 2010-10-25 ENCOUNTER — Ambulatory Visit (INDEPENDENT_AMBULATORY_CARE_PROVIDER_SITE_OTHER): Payer: Medicare Other

## 2010-10-25 DIAGNOSIS — J301 Allergic rhinitis due to pollen: Secondary | ICD-10-CM

## 2010-10-30 ENCOUNTER — Encounter: Payer: Self-pay | Admitting: Internal Medicine

## 2010-10-30 ENCOUNTER — Ambulatory Visit (INDEPENDENT_AMBULATORY_CARE_PROVIDER_SITE_OTHER): Payer: Medicare Other

## 2010-10-30 DIAGNOSIS — J301 Allergic rhinitis due to pollen: Secondary | ICD-10-CM

## 2010-10-31 ENCOUNTER — Telehealth: Payer: Self-pay | Admitting: Endocrinology

## 2010-11-03 ENCOUNTER — Telehealth: Payer: Self-pay | Admitting: Endocrinology

## 2010-11-06 ENCOUNTER — Encounter: Payer: Self-pay | Admitting: Endocrinology

## 2010-11-06 ENCOUNTER — Other Ambulatory Visit: Payer: Self-pay | Admitting: Endocrinology

## 2010-11-06 ENCOUNTER — Encounter: Payer: Self-pay | Admitting: Internal Medicine

## 2010-11-06 ENCOUNTER — Other Ambulatory Visit: Payer: Medicare Other

## 2010-11-06 ENCOUNTER — Ambulatory Visit (INDEPENDENT_AMBULATORY_CARE_PROVIDER_SITE_OTHER): Payer: Medicare Other

## 2010-11-06 ENCOUNTER — Ambulatory Visit (INDEPENDENT_AMBULATORY_CARE_PROVIDER_SITE_OTHER)
Admission: RE | Admit: 2010-11-06 | Discharge: 2010-11-06 | Disposition: A | Discharge status: Home / Self Care | Payer: Medicare Other | Source: Ambulatory Visit | Attending: Endocrinology | Admitting: Endocrinology

## 2010-11-06 DIAGNOSIS — J309 Allergic rhinitis, unspecified: Secondary | ICD-10-CM

## 2010-11-06 DIAGNOSIS — M810 Age-related osteoporosis without current pathological fracture: Secondary | ICD-10-CM

## 2010-11-07 NOTE — Progress Notes (Signed)
Summary: order-LVA  Phone Note From Other Clinic   Caller: Radiology Summary of Call: Pt is schedule for Bone Density. Need order for LVA  Initial call taken by: Brenton Grills CMA Duncan Dull),  November 03, 2010 2:21 PM

## 2010-11-07 NOTE — Progress Notes (Signed)
Summary: DXA req  Phone Note Call from Patient Call back at Home Phone [phone]   Caller: Patient Summary of Call: Pt called stating she has Medicare Wellness physical scheduled for 04/02 and would like her DXA scan to be done either on that day or prior to appr. Pt requests appt on a Monday or Wednesday. Initial call taken by: Margaret Pyle, CMA,  October 31, 2010 10:01 AM  Follow-up for Phone Call        i printed request Follow-up by: Minus Breeding MD,  October 31, 2010 10:08 AM  Additional Follow-up for Phone Call Additional follow up Details #1::        PT IS SCHEDULED ON  FEB 27 @ 11:30 FOR HER DEXA. Additional Follow-up by: Hilarie Fredrickson,  October 31, 2010 4:51 PM

## 2010-11-07 NOTE — Progress Notes (Signed)
Summary: Statin SE?  Phone Note Call from Patient Call back at Home Phone [phone] P PH     Caller: Patient Summary of Call: Pt called complaining of extreme fatigue, Insomnia, muscle pain, weakness and cramps. Pt was advised by her GYN that Lovastatin could be the cause and she should consider holding medication to see if there is a chnage in symptoms or changing medication. Initial call taken by: Margaret Pyle, CMA,  October 31, 2010 9:19 AM  Follow-up for Phone Call        stop lovastatin for now.  medicare wellness is due late march. Follow-up by: Minus Breeding MD,  October 31, 2010 9:30 AM  Additional Follow-up for Phone Call Additional follow up Details #1::        Pt advised and agreed. Will call back to schedule physical Additional Follow-up by: Margaret Pyle, CMA,  October 31, 2010 9:38 AM

## 2010-11-16 NOTE — Miscellaneous (Signed)
Summary: Orders Update   Clinical Lists Changes  Orders: Added new Service order of Allergy Injection (1) (95115) - Signed 

## 2010-11-20 ENCOUNTER — Ambulatory Visit (INDEPENDENT_AMBULATORY_CARE_PROVIDER_SITE_OTHER): Payer: Medicare Other

## 2010-11-20 ENCOUNTER — Encounter: Payer: Self-pay | Admitting: Internal Medicine

## 2010-11-20 DIAGNOSIS — J301 Allergic rhinitis due to pollen: Secondary | ICD-10-CM | POA: Insufficient documentation

## 2010-11-27 ENCOUNTER — Ambulatory Visit (INDEPENDENT_AMBULATORY_CARE_PROVIDER_SITE_OTHER): Payer: Medicare Other

## 2010-11-27 ENCOUNTER — Encounter: Payer: Self-pay | Admitting: Internal Medicine

## 2010-11-27 DIAGNOSIS — J301 Allergic rhinitis due to pollen: Secondary | ICD-10-CM

## 2010-11-28 LAB — POCT CARDIAC MARKERS
CKMB, poc: 1 ng/mL (ref 1.0–8.0)
CKMB, poc: 1.2 ng/mL (ref 1.0–8.0)
CKMB, poc: <1 ng/mL — ABNORMAL LOW (ref 1.0–8.0)
CKMB, poc: <1 ng/mL — ABNORMAL LOW (ref 1.0–8.0)
Myoglobin, poc: 74.5 ng/mL (ref 12–200)
Myoglobin, poc: 89.2 ng/mL (ref 12–200)
Myoglobin, poc: 93.5 ng/mL (ref 12–200)
Myoglobin, poc: 97.3 ng/mL (ref 12–200)
Troponin i, poc: <0.05 ng/mL (ref 0.00–0.09)
Troponin i, poc: <0.05 ng/mL (ref 0.00–0.09)
Troponin i, poc: <0.05 ng/mL (ref 0.00–0.09)
Troponin i, poc: <0.05 ng/mL (ref 0.00–0.09)

## 2010-11-28 LAB — BASIC METABOLIC PANEL
BUN: 14 mg/dL (ref 6–23)
CO2: 29 mEq/L (ref 19–32)
Calcium: 9.7 mg/dL (ref 8.4–10.5)
Chloride: 104 mEq/L (ref 96–112)
Creatinine, Ser: 0.83 mg/dL (ref 0.4–1.2)
GFR calc Af Amer: >60 mL/min (ref >60)
GFR calc non Af Amer: >60 mL/min (ref >60)
Glucose, Bld: 92 mg/dL (ref 70–99)
Potassium: 3.4 mEq/L — ABNORMAL LOW (ref 3.5–5.1)
Sodium: 138 mEq/L (ref 135–145)

## 2010-11-28 LAB — CBC
HCT: 38.3 % (ref 36.0–46.0)
Hemoglobin: 13.3 g/dL (ref 12.0–15.0)
MCHC: 34.6 g/dL (ref 30.0–36.0)
MCV: 88.1 fL (ref 78.0–100.0)
Platelets: 187 10*3/uL (ref 150–400)
RBC: 4.35 MIL/uL (ref 3.87–5.11)
RDW: 13.9 % (ref 11.5–15.5)
WBC: 7.3 10*3/uL (ref 4.0–10.5)

## 2010-11-28 LAB — DIFFERENTIAL
Basophils Absolute: 0 10*3/uL (ref 0.0–0.1)
Basophils Relative: 0 % (ref 0–1)
Eosinophils Absolute: 0 10*3/uL (ref 0.0–0.7)
Eosinophils Relative: 0 % (ref 0–5)
Lymphocytes Relative: 33 % (ref 12–46)
Lymphs Abs: 2.4 10*3/uL (ref 0.7–4.0)
Monocytes Absolute: 0.4 10*3/uL (ref 0.1–1.0)
Monocytes Relative: 6 % (ref 3–12)
Neutro Abs: 4.5 10*3/uL (ref 1.7–7.7)
Neutrophils Relative %: 61 % (ref 43–77)

## 2010-11-28 NOTE — Assessment & Plan Note (Signed)
Summary: allergy/cb  Nurse Visit   Allergies: 1)  ! Ceftin 2)  ! Sulfa  Orders Added: 1)  Allergy Injection (1) [16109]

## 2010-11-28 NOTE — Miscellaneous (Signed)
Summary: Injection [employer] account [employer]   Imported By: Sherian Rein 11/20/2010 14:36:30  _____________________________________________________________________  External Attachment:    Type:   Image     Comment:   External Document

## 2010-12-06 ENCOUNTER — Ambulatory Visit (INDEPENDENT_AMBULATORY_CARE_PROVIDER_SITE_OTHER): Payer: Medicare Other

## 2010-12-06 DIAGNOSIS — J301 Allergic rhinitis due to pollen: Secondary | ICD-10-CM

## 2010-12-07 NOTE — Assessment & Plan Note (Signed)
Summary: allergy/cb  Nurse Visit   Allergies: 1)  ! Ceftin 2)  ! Sulfa  Orders Added: 1)  Allergy Injection (1) [95115] 

## 2010-12-10 NOTE — Progress Notes (Signed)
  Subjective:    Patient ID: Samantha [name], female    DOB: 05/30/1946, 65 y.o.   MRN: [id number]  HPI Subjective:   Patient here for Medicare annual wellness visit and management of other chronic and acute problems.  pt states she feels well in general, except for myalgias.  However, these are improved recently.       Risk factors: advanced age   Roster of Physicians Providing Medical Care to Patient: Rheumatol: devashwar Opthal: brennan ([place]) Gyn: mccomb Allergy:  young    Activities of Daily Living In your present state of health, do you have any difficulty performing the following activities?:  Preparing food and eating?: No  Bathing yourself: No  Getting dressed: No  Using the toilet:No  Moving around from place to place: No  In the past year have you fallen or had a near fall?:No    Home Safety: Has smoke detector and wears seat belts. No firearms.   Diet and Exercise  Current exercise habits:  good Dietary issues discussed: healthy diet   Depression Screen  Q1: Over the past two weeks, have you felt down, depressed or hopeless? no  Q2: Over the past two weeks, have you felt little interest or pleasure in doing things? no   The following portions of the patient's history were reviewed and updated as appropriate: allergies, current medications, past family history, past medical history, past social history, past surgical history and problem list.   Review of Systems  Denies visual loss, memory loss, and hearing loss Objective:   GENERAL: no distress Ears:  Grossly normal hearing. Msk: pt easily and quickly performs "get-up-and-go" from a sitting position Psych: pt remembers 3/3 at 5 minutes.  excellent recall.  can easily read and write a sentence.  alert and oriented x 3. Assessment:   Medicare wellness utd on preventive parameters    Plan:   During the course of the visit the patient was educated and counseled about appropriate screening and preventive  services including:       Fall prevention   Screening mammography  Bone densitometry screening  Nutrition counseling   Patient Instructions (the written plan) was given to the patient.        Review of Systems    Objective:   Physical Exam        Assessment & Plan:

## 2010-12-11 ENCOUNTER — Encounter: Payer: Self-pay | Admitting: Endocrinology

## 2010-12-11 ENCOUNTER — Ambulatory Visit (INDEPENDENT_AMBULATORY_CARE_PROVIDER_SITE_OTHER): Payer: Medicare Other | Admitting: Endocrinology

## 2010-12-11 ENCOUNTER — Ambulatory Visit (INDEPENDENT_AMBULATORY_CARE_PROVIDER_SITE_OTHER): Payer: Medicare Other

## 2010-12-11 ENCOUNTER — Other Ambulatory Visit (INDEPENDENT_AMBULATORY_CARE_PROVIDER_SITE_OTHER): Payer: Medicare Other

## 2010-12-11 VITALS — BP 118/68 | HR 87 | Temp 98.5°F | Ht 63.0 in | Wt 139.6 lb

## 2010-12-11 DIAGNOSIS — Z0000 Encounter for general adult medical examination without abnormal findings: Secondary | ICD-10-CM

## 2010-12-11 DIAGNOSIS — R7309 Other abnormal glucose: Secondary | ICD-10-CM

## 2010-12-11 DIAGNOSIS — E7800 Pure hypercholesterolemia, unspecified: Secondary | ICD-10-CM

## 2010-12-11 DIAGNOSIS — N209 Urinary calculus, unspecified: Secondary | ICD-10-CM

## 2010-12-11 DIAGNOSIS — J301 Allergic rhinitis due to pollen: Secondary | ICD-10-CM

## 2010-12-11 DIAGNOSIS — I10 Essential (primary) hypertension: Secondary | ICD-10-CM

## 2010-12-11 LAB — URINALYSIS, ROUTINE W REFLEX MICROSCOPIC
Bilirubin Urine: NEGATIVE
Hgb urine dipstick: NEGATIVE
Ketones, ur: NEGATIVE
Leukocytes, UA: NEGATIVE
Nitrite: NEGATIVE
Specific Gravity, Urine: <=1.005 (ref 1.000–1.030)
Total Protein, Urine: NEGATIVE
Urine Glucose: NEGATIVE
Urobilinogen, UA: 0.2 (ref 0.0–1.0)
pH: 7 (ref 5.0–8.0)

## 2010-12-11 LAB — LIPID PANEL
Cholesterol: 198 mg/dL (ref 0–200)
HDL: 87.7 mg/dL (ref >39.00)
LDL Cholesterol: 94 mg/dL (ref 0–99)
Total CHOL/HDL Ratio: 2
Triglycerides: 84 mg/dL (ref 0.0–149.0)
VLDL: 16.8 mg/dL (ref 0.0–40.0)

## 2010-12-11 LAB — HEMOGLOBIN A1C: Hgb A1c MFr Bld: 6.3 % (ref 4.6–6.5)

## 2010-12-11 NOTE — Patient Instructions (Signed)
blood tests are being ordered for you today.  please call [phone] to hear your test results. pending the test results, please continue the same medications for now please consider these measures for your health:  minimize alcohol.  do not use tobacco products.  have a colonoscopy at least every 10 years from age 65.  keep firearms safely stored.  always use seat belts.  have working smoke alarms in your home.  see an eye doctor and dentist regularly.  never drive under the influence of alcohol or drugs (including prescription drugs).   please let me know what your wishes would be, if artificial life support measures should become necessary.  it is critically important to prevent falling down (keep floor areas well-lit, dry, and free of loose objects).

## 2010-12-12 ENCOUNTER — Encounter: Payer: Self-pay | Admitting: Endocrinology

## 2010-12-13 DIAGNOSIS — Z0000 Encounter for general adult medical examination without abnormal findings: Secondary | ICD-10-CM | POA: Insufficient documentation

## 2010-12-14 ENCOUNTER — Telehealth: Payer: Self-pay

## 2010-12-14 MED ORDER — LOSARTAN POTASSIUM-HCTZ 100-12.5 MG PO TABS: 1.0000 | ORAL_TABLET | Freq: Every day | ORAL | Status: DC

## 2010-12-14 NOTE — Telephone Encounter (Signed)
Pt called requesting Rx to send to mail order pharmacy. Rx printed and mailed to pt, pt aware.

## 2010-12-18 ENCOUNTER — Ambulatory Visit (INDEPENDENT_AMBULATORY_CARE_PROVIDER_SITE_OTHER): Payer: Medicare Other

## 2010-12-18 DIAGNOSIS — J301 Allergic rhinitis due to pollen: Secondary | ICD-10-CM

## 2010-12-25 ENCOUNTER — Ambulatory Visit (INDEPENDENT_AMBULATORY_CARE_PROVIDER_SITE_OTHER): Payer: Medicare Other

## 2010-12-25 DIAGNOSIS — J309 Allergic rhinitis, unspecified: Secondary | ICD-10-CM

## 2010-12-29 ENCOUNTER — Telehealth: Payer: Self-pay

## 2010-12-29 NOTE — Telephone Encounter (Signed)
Pt called requesting referral to Dietician as discussed in OV yesterday. Pt lives in [place] and prefers local referral is possible.

## 2011-01-08 ENCOUNTER — Ambulatory Visit: Payer: Medicare Other

## 2011-01-08 ENCOUNTER — Ambulatory Visit (INDEPENDENT_AMBULATORY_CARE_PROVIDER_SITE_OTHER): Payer: Medicare Other

## 2011-01-08 DIAGNOSIS — J309 Allergic rhinitis, unspecified: Secondary | ICD-10-CM

## 2011-01-15 ENCOUNTER — Ambulatory Visit (INDEPENDENT_AMBULATORY_CARE_PROVIDER_SITE_OTHER): Payer: Medicare Other | Admitting: Internal Medicine

## 2011-01-15 ENCOUNTER — Encounter: Payer: Self-pay | Admitting: Internal Medicine

## 2011-01-15 ENCOUNTER — Ambulatory Visit (INDEPENDENT_AMBULATORY_CARE_PROVIDER_SITE_OTHER): Payer: Medicare Other

## 2011-01-15 VITALS — BP 114/60 | HR 82 | Ht 62.0 in | Wt 139.0 lb

## 2011-01-15 DIAGNOSIS — J309 Allergic rhinitis, unspecified: Secondary | ICD-10-CM

## 2011-01-15 DIAGNOSIS — J45909 Unspecified asthma, uncomplicated: Secondary | ICD-10-CM

## 2011-01-15 NOTE — Assessment & Plan Note (Addendum)
Good control now on allergy vaccine here at 1:50. Because of occasional local reaction, we will not advance at this time.

## 2011-01-15 NOTE — Patient Instructions (Signed)
We will continue allergy vaccine at 1:50.

## 2011-01-15 NOTE — Progress Notes (Signed)
  Subjective:    Patient ID: Samantha [name], female    DOB: [birth year], 65 y.o.   MRN: [id number]  HPI 01/15/11- 35 yoF never smoker,  Followed for allergic rhinitis and ashtma with GERD.  Last here September 05, 2010. Did well since last here, but was dx'd with fibromyalgia/ Dr Titus Dubin,  then suspected statin induced muscle pains.  Says breathing has been good. Needed to use rescue inhaler twice for some tightness after being out in wind/ pollen. Continues allergy vaccine, reporting occasional local reaction at 1:50.  Review of Systems Constitutional:   No weight loss, night sweats,  Fevers, chills, fatigue, lassitude. HEENT:   No headaches,  Difficulty swallowing,  Tooth/dental problems,  Sore throat,                No sneezing, itching, ear ache, nasal congestion, post nasal drip,   CV:  No chest pain,  Orthopnea, PND, swelling in lower extremities, anasarca, dizziness, palpitations  GI  No heartburn, indigestion, abdominal pain, nausea, vomiting, diarrhea, change in bowel habits, loss of appetite  Resp: No shortness of breath with exertion or at rest.  No excess mucus, no productive cough,  No non-productive cough,  No coughing up of blood.  No change in color of mucus.  No wheezing.   Skin: no rash or lesions.  GU: no dysuria, change in color of urine, no urgency or frequency.  No flank pain.  MS:  No joint pain or swelling.  No decreased range of motion.  No back pain.  Psych:  No change in mood or affect. No depression or anxiety.  No memory loss.       Objective:   Physical Exam General- Alert, Oriented, Affect-appropriate, Distress- none acute wdwn, talkative  Skin- rash-none, lesions- none, excoriation- none  Lymphadenopathy- none  Head- atraumatic  Eyes- Gross vision intact, PERRLA, conjunctivae clear, secretions  Ears- Normal-  Hearing, canals, Tm   Nose- Clear,  No- Septal dev, mucus, polyps, erosion, perforation   Throat- Mallampati II , mucosa clear ,  drainage- none, tonsils- atrophic  Neck- flexible , trachea midline, no stridor , thyroid nl, carotid no bruit  Chest - symmetrical excursion , unlabored     Heart/CV- RRR , no murmur , no gallop  , no rub, nl s1 s2                     - JVD- none , edema- none, stasis changes- none, varices- none     Lung- clear to P&A- few crackles in bases, wheeze- none, cough- none , dullness-none, rub- none     Chest wall-  Abd- tender-no, distended-no, bowel sounds-present, HSM- no  Br/ Gen/ Rectal- Not done, not indicated  Extrem- cyanosis- none, clubbing, none, atrophy- none, strength- nl  Neuro- grossly intact to observation         Assessment & Plan:

## 2011-01-21 ENCOUNTER — Encounter: Payer: Self-pay | Admitting: Internal Medicine

## 2011-01-21 NOTE — Assessment & Plan Note (Signed)
Mild intermittent asthma, well controlled now.

## 2011-01-22 ENCOUNTER — Ambulatory Visit (INDEPENDENT_AMBULATORY_CARE_PROVIDER_SITE_OTHER): Payer: Medicare Other

## 2011-01-22 DIAGNOSIS — J309 Allergic rhinitis, unspecified: Secondary | ICD-10-CM

## 2011-01-23 ENCOUNTER — Ambulatory Visit (INDEPENDENT_AMBULATORY_CARE_PROVIDER_SITE_OTHER): Payer: Medicare Other

## 2011-01-23 DIAGNOSIS — J309 Allergic rhinitis, unspecified: Secondary | ICD-10-CM

## 2011-01-26 NOTE — Op Note (Signed)
[facility]  Patient:    Samantha [name], Samantha [name] Visit Number: [id number] MRN: [id number]          Service Type: DSU Location: DAY Attending Physician:  Lendon Colonel Dictated by:   Kathie Rhodes. Kyra Manges, M.D. Proc. Date: 02/18/02 Admit Date:  02/18/2002                             Operative Report  PREOPERATIVE DIAGNOSIS:  Pelvic mass.  POSTOPERATIVE DIAGNOSIS:  Uterine fibroid. See description.  DESCRIPTION OF PROCEDURE:  The patient was placed in lithotomy position and examination under anesthesia revealed some nodularity in the left uterosacral ligament. The uterus was irregular. Prep and drape, Foley catheter was inserted, a transverse incision was made in the abdomen and the abdomen was distended with a Veress needle using aspiration and fusion technique. A trocar was inserted into the abdomen and visualization of the pelvis revealed a small uterine fibroid that measured about 3 cm off the posterior surface of the fundus. No other abnormalities of the uterus. Both ovaries had some granular appearance to the surface. These were cystic in nature. They were consistent bilaterally. Retroperitoneally she had some solid areas just above the uterosacral ligaments consistent with either interligamentous myoma. I then looked a the liver and the liver was smooth and the diaphragm was smooth. She had some adhesions in the liver to the diaphragm. We then converted to operative laparoscopy, placed a second port in the midline and a third port on the left side, manipulated the left ovary and obtained several good ovarian biopsies. Prior to doing any biopsies, peritoneal washings were obtained. Gas was evacuated and the skin was closed with #0 Vicryl in the infraumbilical incision. The remaining incisions were then closed with 3-0 Vicryl. A dry sterile dressing was applied. The patient was awakened and carried to the recovery room in good condition. Dictated by:    S. Kyra Manges, M.D. Attending Physician:  Lendon Colonel DD:  02/18/02 TD:  02/19/02 Job: [id number]

## 2011-01-26 NOTE — Op Note (Signed)
[facility]  Patient:    Samantha [name], Samantha [name]                       MRN: [id number] Proc. Date: 11/01/00 Adm. Date:  [id number] Disc. Date: [id number] Attending:  Katherine Roan                           Operative Report  PREOPERATIVE DIAGNOSIS:  Right distal ureteral calculus, 6 x 5 mm.  POSTOPERATIVE DIAGNOSIS: Right distal ureteral calculus, 6 x 5 mm.  OPERATION:  Cystoscopy, right retrograde pyelogram, right ureteroscopy with holmium lasertripsy, and insertion of ureteral stent.  SURGEON:  Rozanna Boer., M.D.  ANESTHESIA:  General.  BRIEF HISTORY:  This 65 year old widowed black female was admitted with a distal obstructing fragment from a previous lithotripsy.  She had lithotripsy of a 10 x 7 mm stone in her proximal right ureter on October 28, 2000.  She was seen two days later.  She had a 6 mm fragment down about 8 cm from the UV junction.  This was now down into the distal ureter today when she came in and enters now for cystourethroscopy and holmium lasertripsy for this obstructing stone fragment.  She has passed a few fragments, some of which are fairly large, but the remaining fragment is still obstructing.  DESCRIPTION OF PROCEDURE:  The patient was placed on the operating table in the dorsolithotomy position after satisfactory induction of general anesthesia.  She was prepped and draped with Betadine and given a gram of Ancef and tobramycin IV.  Using the cystoscope, her bladder was entered.  The right orifice was quite edematous and erythematous and bulging.  I had to use a guidewire through an open-ended catheter to get the catheter into the right orifice, and retrograde demonstrated the stone present.  She still had hydronephrosis.  Through the open-ended catheter, a guidewire was passed through.  The open-ended was then removed under fluoroscopy.  Over the guidewire, a 4 cm ureteral balloon dilator was passed and  inflated to 10 atmospheres for 4 timed minutes.  Following this, leaving the guidewire in place, a #6 short ureteroscope was passed into the opening, the stone was seen, and the holmium laser was calibrated to 365, and the stone was attacked.  The laser seemed to break the stone into at least two big fragments and a few smaller ones.  After this, I was able to get a stone basket on each of the two fragments and pulled them out separately.  A third pass revealed no further fragments.  Over the guidewire, a 24 cm 6-French ureteral stent was passed with a string on the distal end.  The guidewire was removed; the stent was in good position, and the string brought out to the urethra and taped to her lower abdomen.  The patient was then allowed to react from her anesthetic, was given Toradol IV and taken to the recovery room in good condition where she will later be discharged as an outpatient. DD:  11/01/00 TD:  11/04/00 Job: [id number]

## 2011-01-26 NOTE — Consult Note (Signed)
[place]  Patient:    Samantha [name], Samantha [name]                       MRN: [id number] Proc. Date: 10/26/00 Adm. Date:  [id number] Attending:  Osvaldo Human                          Consultation Report  REFERRING PHYSICIAN:  Osvaldo Human, M.D.  REASON FOR CONSULTATION:  A 6 mm right proximal ureteral stone with obstruction.  BRIEF HISTORY:  This 65 year old patient came to the emergency room with acute right flank pain that began earlier this morning.  She had had a previous stone in her right kidney, see by Dr. Vonita Moss five or six years ago but has not had any trouble since then and has never passed the stone.  The pain was quite severe and unbearable and she had a CT scan which showed the above-mentioned stone as well as some mild hydronephrosis.  After some Toradol she became pain-free.  She does have a brother with a history of stones, in fact, Dr. Wanda Plump did a ureteroscopy a few years ago on that.  MEDICATIONS:  She does have asthma and takes Nasacort, Prevacid 30 mg at night, and Etodolac 400 mg b.i.d. for painful bone spur.  ALLERGIES:  She has allergies to SULFA.  PAST SURGICAL HISTORY:  No previous operations.  REVIEW OF SYSTEMS:  Other than her asthma, her review of systems is pretty much unremarkable.  No GI complaints.  No heart disease.  No change in bowel habits or arthritis.  FAMILY AND SOCIAL HISTORY:  She has been widowed for about four years.  Her husband died of multiple myeloma he got from [place].  She has two sons and one daughter and a brother that is with her today, as well.  She lives alone and is self-sufficient.  No family history of diabetes or cancer.  PHYSICAL EXAMINATION:  VITAL SIGNS:  Vital signs are normal.  Her blood pressure is 180/85, pulse is 100, respirations 20.  GENERAL:  She is a pleasant black female and is presently in no acute distress.  ABDOMEN:  Soft and benign without masses or  tenderness.  She does have mild right CVA tenderness.  No organomegaly.  Spleen and liver nonpalpable.  PELVIC:  Deferred.  She does have no suprapubic tenderness.  EXTREMITIES:  Reveal faint distal pulses, intact sensation to light touch.  No edema.  LYMPH NODES:  No inguinal, cervical, or axillary adenopathy.  CHEST:  Breast and respiratory rate are symmetric bilaterally.  IMPRESSION: 1. A 6 mm proximal right ureteral stone with mild obstruction. 2. Asthma.  RECOMMENDATIONS:  Recommend since she is pain-free will try to do without a stent but we will set her up for lithotripsy to be done on October 28, 2000, at 12 oclock.  She will come by the office before hand to sign papers.  ECG, BMET, CBC and KUB were all done today. DD:  10/26/00 TD:  10/27/00 Job: [id number]

## 2011-01-30 ENCOUNTER — Ambulatory Visit (INDEPENDENT_AMBULATORY_CARE_PROVIDER_SITE_OTHER): Payer: Medicare Other

## 2011-01-30 DIAGNOSIS — J309 Allergic rhinitis, unspecified: Secondary | ICD-10-CM

## 2011-02-06 ENCOUNTER — Ambulatory Visit (INDEPENDENT_AMBULATORY_CARE_PROVIDER_SITE_OTHER): Payer: Medicare Other

## 2011-02-06 DIAGNOSIS — J309 Allergic rhinitis, unspecified: Secondary | ICD-10-CM

## 2011-02-14 ENCOUNTER — Ambulatory Visit (INDEPENDENT_AMBULATORY_CARE_PROVIDER_SITE_OTHER): Payer: Medicare Other

## 2011-02-14 DIAGNOSIS — J309 Allergic rhinitis, unspecified: Secondary | ICD-10-CM

## 2011-02-19 ENCOUNTER — Ambulatory Visit (INDEPENDENT_AMBULATORY_CARE_PROVIDER_SITE_OTHER): Payer: Medicare Other

## 2011-02-19 DIAGNOSIS — J309 Allergic rhinitis, unspecified: Secondary | ICD-10-CM

## 2011-02-22 ENCOUNTER — Encounter: Payer: Self-pay | Admitting: Internal Medicine

## 2011-02-26 ENCOUNTER — Ambulatory Visit (INDEPENDENT_AMBULATORY_CARE_PROVIDER_SITE_OTHER): Payer: Medicare Other

## 2011-02-26 DIAGNOSIS — J309 Allergic rhinitis, unspecified: Secondary | ICD-10-CM

## 2011-03-08 ENCOUNTER — Ambulatory Visit (INDEPENDENT_AMBULATORY_CARE_PROVIDER_SITE_OTHER): Payer: Medicare Other

## 2011-03-08 DIAGNOSIS — J309 Allergic rhinitis, unspecified: Secondary | ICD-10-CM

## 2011-03-12 ENCOUNTER — Ambulatory Visit (INDEPENDENT_AMBULATORY_CARE_PROVIDER_SITE_OTHER): Payer: Medicare Other

## 2011-03-12 DIAGNOSIS — J309 Allergic rhinitis, unspecified: Secondary | ICD-10-CM

## 2011-03-19 ENCOUNTER — Ambulatory Visit (INDEPENDENT_AMBULATORY_CARE_PROVIDER_SITE_OTHER): Payer: Medicare Other

## 2011-03-19 DIAGNOSIS — J309 Allergic rhinitis, unspecified: Secondary | ICD-10-CM

## 2011-03-27 ENCOUNTER — Ambulatory Visit (INDEPENDENT_AMBULATORY_CARE_PROVIDER_SITE_OTHER): Payer: Medicare Other

## 2011-03-27 DIAGNOSIS — J309 Allergic rhinitis, unspecified: Secondary | ICD-10-CM

## 2011-04-04 ENCOUNTER — Ambulatory Visit (INDEPENDENT_AMBULATORY_CARE_PROVIDER_SITE_OTHER): Payer: Medicare Other

## 2011-04-04 DIAGNOSIS — J309 Allergic rhinitis, unspecified: Secondary | ICD-10-CM

## 2011-04-09 ENCOUNTER — Ambulatory Visit (INDEPENDENT_AMBULATORY_CARE_PROVIDER_SITE_OTHER): Payer: Medicare Other

## 2011-04-09 DIAGNOSIS — J309 Allergic rhinitis, unspecified: Secondary | ICD-10-CM

## 2011-04-16 ENCOUNTER — Ambulatory Visit (INDEPENDENT_AMBULATORY_CARE_PROVIDER_SITE_OTHER): Payer: Medicare Other

## 2011-04-16 DIAGNOSIS — J309 Allergic rhinitis, unspecified: Secondary | ICD-10-CM

## 2011-04-20 ENCOUNTER — Telehealth: Payer: Self-pay | Admitting: Internal Medicine

## 2011-04-20 ENCOUNTER — Other Ambulatory Visit: Payer: Self-pay

## 2011-04-20 MED ORDER — ALBUTEROL SULFATE HFA 108 (90 BASE) MCG/ACT IN AERS: 2.0000 | INHALATION_SPRAY | Freq: Four times a day (QID) | RESPIRATORY_TRACT | Status: DC | PRN

## 2011-04-20 MED ORDER — LANSOPRAZOLE 30 MG PO CPDR: 30.0000 mg | DELAYED_RELEASE_CAPSULE | Freq: Two times a day (BID) | ORAL | Status: DC

## 2011-04-20 MED ORDER — MONTELUKAST SODIUM 10 MG PO TABS: 10.0000 mg | ORAL_TABLET | Freq: Every day | ORAL | Status: DC

## 2011-04-20 MED ORDER — LOSARTAN POTASSIUM-HCTZ 100-12.5 MG PO TABS: 1.0000 | ORAL_TABLET | Freq: Every day | ORAL | Status: DC

## 2011-04-20 NOTE — Telephone Encounter (Signed)
Called and spoke with pt and she is aware that rx have been printed and will mail these to the pt.

## 2011-04-23 ENCOUNTER — Other Ambulatory Visit: Payer: Self-pay | Admitting: Internal Medicine

## 2011-04-23 ENCOUNTER — Ambulatory Visit (INDEPENDENT_AMBULATORY_CARE_PROVIDER_SITE_OTHER): Payer: Medicare Other

## 2011-04-23 DIAGNOSIS — J309 Allergic rhinitis, unspecified: Secondary | ICD-10-CM

## 2011-04-23 MED ORDER — MONTELUKAST SODIUM 10 MG PO TABS: 10.0000 mg | ORAL_TABLET | Freq: Every day | ORAL | Status: DC

## 2011-04-23 MED ORDER — ALBUTEROL SULFATE HFA 108 (90 BASE) MCG/ACT IN AERS: 2.0000 | INHALATION_SPRAY | Freq: Four times a day (QID) | RESPIRATORY_TRACT | Status: DC | PRN

## 2011-04-23 NOTE — Telephone Encounter (Signed)
RX signed and has been faxed to [facility] per patient request.

## 2011-04-23 NOTE — Telephone Encounter (Signed)
Pt says her prescriptions for Singulair and Proventil were sent into the local pharmacy by mistake and these need to be faxed to [facility] at the number provided. We will reprint these prescriptions and have CDY sign so they can be faxed this morning. Prescriptions printed and placed on CDY cart for signature.

## 2011-04-30 ENCOUNTER — Ambulatory Visit (INDEPENDENT_AMBULATORY_CARE_PROVIDER_SITE_OTHER): Payer: Medicare Other

## 2011-04-30 DIAGNOSIS — J309 Allergic rhinitis, unspecified: Secondary | ICD-10-CM

## 2011-05-07 ENCOUNTER — Ambulatory Visit (INDEPENDENT_AMBULATORY_CARE_PROVIDER_SITE_OTHER): Payer: Medicare Other

## 2011-05-07 DIAGNOSIS — J309 Allergic rhinitis, unspecified: Secondary | ICD-10-CM

## 2011-05-09 ENCOUNTER — Telehealth: Payer: Self-pay | Admitting: Internal Medicine

## 2011-05-09 ENCOUNTER — Other Ambulatory Visit (INDEPENDENT_AMBULATORY_CARE_PROVIDER_SITE_OTHER): Payer: Medicare Other

## 2011-05-09 ENCOUNTER — Ambulatory Visit (INDEPENDENT_AMBULATORY_CARE_PROVIDER_SITE_OTHER): Payer: Medicare Other | Admitting: Endocrinology

## 2011-05-09 ENCOUNTER — Encounter: Payer: Self-pay | Admitting: Endocrinology

## 2011-05-09 VITALS — BP 116/62 | HR 78 | Temp 98.3°F | Ht 62.0 in | Wt 137.2 lb

## 2011-05-09 DIAGNOSIS — R059 Cough, unspecified: Secondary | ICD-10-CM

## 2011-05-09 DIAGNOSIS — E7800 Pure hypercholesterolemia, unspecified: Secondary | ICD-10-CM

## 2011-05-09 DIAGNOSIS — R05 Cough: Secondary | ICD-10-CM

## 2011-05-09 MED ORDER — ALBUTEROL SULFATE HFA 108 (90 BASE) MCG/ACT IN AERS: 2.0000 | INHALATION_SPRAY | Freq: Four times a day (QID) | RESPIRATORY_TRACT | Status: DC | PRN

## 2011-05-09 MED ORDER — DOXYCYCLINE HYCLATE 100 MG PO TABS: 100.0000 mg | ORAL_TABLET | Freq: Two times a day (BID) | ORAL | Status: DC

## 2011-05-09 NOTE — Telephone Encounter (Signed)
Spoke with pt and she c/o congestion in her head and chest, sore throat, ears feels stopped up, cough w/ occasional thick white phlem, and very little chest tightness but her inhalers help with that x 1 week. Please advise recs for pt Dr. Maple Hudson. Thanks  Allergies  Allergen Reactions  . Cefuroxime Axetil     REACTION: pt not sure  . Sulfonamide Derivatives     REACTION: pt not sure    Carver Fila, CMA

## 2011-05-09 NOTE — Telephone Encounter (Signed)
Noted by CY.  

## 2011-05-09 NOTE — Patient Instructions (Addendum)
blood tests are being requested for you today.  please call [phone] to hear your test results.  You will be prompted to enter the 9-digit "MRN" number that appears at the top left of this page, followed by #.  Then you will hear the message. i have sent a prescription to your pharmacy, for an antibiotic. I hope you feel better soon.  If you don't feel better by next week, please call back. (pt was advised to re-try the mevacor)

## 2011-05-09 NOTE — Progress Notes (Signed)
Subjective:    Patient ID: Samantha [name], female    DOB: 1945-10-11, 65 y.o.   MRN: [id number]  HPI Pt states 2 weeks of slight cough in the chest, and assoc sore throat.   Myalgias are improved but not resolved off the mevacor Past Medical History  Diagnosis Date  . ASTHMA 04/14/2007  . HYPERCHOLESTEROLEMIA 01/07/2008  . HYPERTENSION 01/28/2008  . SINUSITIS 02/10/2009  . ALLERGIC RHINITIS 07/26/2008  . INSOMNIA 05/10/2008  . FIBROIDS, UTERUS 05/10/2008  . OSTEOPOROSIS 04/14/2007  . OSTEOARTHRITIS 11/28/2009  . GERD 04/14/2007  . Irritable bowel syndrome 04/06/2010  . CHEST PAIN 08/26/2008  . HYPERGLYCEMIA 11/28/2009  . 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 ASYMPTOMATIC POSTMENOPAUSAL STATUS 05/10/2008  . 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 Episcleritis    Past Surgical History  Procedure Date  . Nasal polyp surgery     Polypectomy and septoplasty  . Laparoscopy   . Neck injury 1983  . Stress cardiolite 02/13/2002  . Electrocardiogram 12/04/2006  . Colonoscopy w/ polypectomy    History   Social History  . Marital Status: Widowed    Spouse Name: N/A    Number of Children: N/A  . Years of Education: N/A   Occupational History  .      Retired Journalist, newspaper)   Social History Main Topics  . Smoking status: Never Smoker   . Smokeless tobacco: Never Used  . Alcohol Use: No  . Drug Use: No  . Sexually Active: Not on file   Other Topics Concern  . Not on file   Social History Narrative   Lives alone (widowed)    Current Outpatient Prescriptions on File Prior to Visit  Medication Sig Dispense Refill  . albuterol (PROVENTIL HFA) 108 (90 BASE) MCG/ACT inhaler Inhale 2 puffs into the lungs every 6 (six) hours as needed.  3 Inhaler  3  . Alum & Mag Hydroxide-Simeth (MAALOX PLUS) 225-200-25 MG/5ML SUSP Take by mouth. As needed       . Ascorbic Acid (VITAMIN C) 500 MG tablet Take 500 mg by mouth daily.        . bifidobacterium infantis (ALIGN) capsule Take 1 capsule  by mouth daily.        . brimonidine (ALPHAGAN) 0.2 % ophthalmic solution Place 1 drop into both eyes 2 (two) times daily.        . Calcium Carbonate (CALCIUM 500 PO) Take 1 tablet by mouth 3 (three) times daily with meals.        . cetirizine (ZYRTEC) 10 MG tablet Take 10 mg by mouth daily.        . Cholecalciferol (VITAMIN D3) 400 UNITS tablet Take 400 Units by mouth 2 (two) times daily with a meal.        . Coenzyme Q-10 60 MG CAPS Take 1 capsule by mouth 3 (three) times daily.        . cromolyn (NASALCROM) 5.2 MG/ACT nasal spray 1-2 sprays each nostril four times a day as needed       . cyclobenzaprine (FLEXERIL) 10 MG tablet 1 tablet by mouth at bedtime as needed       . EPINEPHrine (EPIPEN) 0.3 mg/0.3 mL DEVI For severe allergic reaction       . lansoprazole (PREVACID) 30 MG capsule Take 1 capsule (30 mg total) by mouth 2 (two) times daily. 1 by mouth two times a day  180 capsule  3  . losartan-hydrochlorothiazide (HYZAAR) 100-12.5 MG per  tablet Take 1 tablet by mouth daily.  90 tablet  3  . Magnesium Malate POWD (1000mg  tabs) 1 tablet by mouth three times a day       . Manganese Gluconate 50 MG TABS Take 1 tablet by mouth daily.        . montelukast (SINGULAIR) 10 MG tablet Take 1 tablet (10 mg total) by mouth at bedtime. Take 1 tablet by mouth once daily  90 tablet  3  . thiamine 100 MG tablet Take 100 mg by mouth daily.          Allergies  Allergen Reactions  . Cefuroxime Axetil     REACTION: pt not sure  . Sulfonamide Derivatives     REACTION: pt not sure    Family History  Problem Relation Age of Onset  . Heart disease Father     CHF  . Cancer Neg Hx     No FH of Colon Cancer   BP 116/62  Pulse 78  Temp(Src) 98.3 F (36.8 C) (Oral)  Ht 5\' 2"  (1.575 m)  Wt 137 lb 3.2 oz (62.234 kg)  BMI 25.09 kg/m2  SpO2 96%  Review of Systems Denies fever.  She has earache, right > left    Objective:   Physical Exam VITAL SIGNS:  See vs page GENERAL: no distress head: no  deformity eyes: no periorbital swelling, no proptosis external nose and ears are normal mouth: no lesion seen Both eac's and tm's are normal LUNGS:  Clear to auscultation    Assessment & Plan:  Glenford Peers, new. Dyslipidemia, therapy is limited by perceived drug intolerance

## 2011-05-09 NOTE — Telephone Encounter (Signed)
Pt stated she will go in to see her PCP.  Samantha [name]

## 2011-05-15 ENCOUNTER — Ambulatory Visit (INDEPENDENT_AMBULATORY_CARE_PROVIDER_SITE_OTHER): Payer: Medicare Other

## 2011-05-15 DIAGNOSIS — J309 Allergic rhinitis, unspecified: Secondary | ICD-10-CM

## 2011-05-15 LAB — LIPID PANEL
Cholesterol: 188 mg/dL (ref 0–200)
HDL: 88.8 mg/dL (ref >39.00)
LDL Cholesterol: 81 mg/dL (ref 0–99)
Total CHOL/HDL Ratio: 2
Triglycerides: 90 mg/dL (ref 0.0–149.0)
VLDL: 18 mg/dL (ref 0.0–40.0)

## 2011-05-22 ENCOUNTER — Telehealth: Payer: Self-pay | Admitting: Internal Medicine

## 2011-05-22 NOTE — Telephone Encounter (Signed)
I spoke with Samantha [name] and she c/o nasal congestion x 4 weeks. Samantha [name] states she did not want anything called in until Dr. Maple Hudson evaluates her. Samantha [name] is scheduled to see CDY Friday 9/14 at 2 p.m. Samantha [name] is aware to seek emergency care if she gets worse. Nothing further was needed

## 2011-05-22 NOTE — Telephone Encounter (Signed)
Pt returning call can be reached at [phone] or [phone].Samantha [name]

## 2011-05-24 ENCOUNTER — Ambulatory Visit (INDEPENDENT_AMBULATORY_CARE_PROVIDER_SITE_OTHER)
Admission: RE | Admit: 2011-05-24 | Discharge: 2011-05-24 | Disposition: A | Discharge status: Home / Self Care | Payer: Medicare Other | Source: Ambulatory Visit | Attending: Internal Medicine | Admitting: Internal Medicine

## 2011-05-24 ENCOUNTER — Ambulatory Visit (INDEPENDENT_AMBULATORY_CARE_PROVIDER_SITE_OTHER): Payer: Medicare Other

## 2011-05-24 ENCOUNTER — Encounter: Payer: Self-pay | Admitting: Internal Medicine

## 2011-05-24 ENCOUNTER — Ambulatory Visit (INDEPENDENT_AMBULATORY_CARE_PROVIDER_SITE_OTHER): Payer: Medicare Other | Admitting: Internal Medicine

## 2011-05-24 VITALS — BP 128/68 | HR 101 | Ht 62.0 in | Wt 136.2 lb

## 2011-05-24 DIAGNOSIS — J309 Allergic rhinitis, unspecified: Secondary | ICD-10-CM

## 2011-05-24 DIAGNOSIS — J329 Chronic sinusitis, unspecified: Secondary | ICD-10-CM

## 2011-05-24 DIAGNOSIS — R079 Chest pain, unspecified: Secondary | ICD-10-CM

## 2011-05-24 DIAGNOSIS — J301 Allergic rhinitis due to pollen: Secondary | ICD-10-CM

## 2011-05-24 MED ORDER — PREDNISONE 20 MG PO TABS: 20.0000 mg | ORAL_TABLET | Freq: Every day | ORAL | Status: AC

## 2011-05-24 MED ORDER — AZITHROMYCIN 250 MG PO TABS: ORAL_TABLET | ORAL | Status: AC

## 2011-05-24 NOTE — Assessment & Plan Note (Signed)
We will explore ability to get her shots at our [place] office.

## 2011-05-24 NOTE — Progress Notes (Signed)
Subjective:    Patient ID: Samantha [name], female    DOB: [birth year], 65 y.o.   MRN: [id number]  HPI    Review of Systems     Objective:   Physical Exam        Assessment & Plan:   Subjective:    Patient ID: Samantha [name], female    DOB: Apr 20, 1946, 65 y.o.   MRN: [id number]  HPI 01/15/11- 40 yoF never smoker,  Followed for allergic rhinitis and ashtma with GERD.  Last here September 05, 2010. Did well since last here, but was dx'd with fibromyalgia/ Dr Titus Dubin,  then suspected statin induced muscle pains.  Says breathing has been good. Needed to use rescue inhaler twice for some tightness after being out in wind/ pollen. Continues allergy vaccine, reporting occasional local reaction at 1:50.  05/24/11- 64 yoF never smoker,  Followed for allergic rhinitis and ashtma with GERD. 5 weeks head and throat congestion with bad sore throat. [name] to Dr Everardo All- treated doxy as sinusitis- finished x1 week ago. Marland Kitchen Has had laryngitis and tussive right lateral rib pains x 2 days. Denies change in chronic fibromyalgia pains,  She has to travel a long way to get here from [place] and is interested if we can get her allergy shots given through the [place] office. We discussed antibiotic choices   Review of Systems Constitutional:   No weight loss, night sweats,  Fevers, chills, fatigue, lassitude. HEENT:   No headaches,  Difficulty swallowing,  Tooth/dental problems, + Sore throat,                No sneezing, itching, ear ache,   +nasal congestion, post nasal drip,  CV:  + chest pain,  Orthopnea, PND, swelling in lower extremities, anasarca, dizziness, palpitations GI  No heartburn, indigestion, abdominal pain, nausea, vomiting, diarrhea, change in bowel habits, loss of appetite Resp: No shortness of breath with exertion or at rest.  No excess mucus, no productive cough,  No non-productive cough,  No coughing up of blood.  No change in color of mucus.  No wheezing.   Skin: no rash or  lesions. GU: no dysuria, change in color of urine, no urgency or frequency.  No flank pain. MS:  No joint pain or swelling.  No decreased range of motion.  No back pain. Psych:  No change in mood or affect. No depression or anxiety.  No memory loss.       Objective:   Physical Exam General- Alert, Oriented, Affect-appropriate, Distress- none acute; pleasant and talkative Skin- rash-none, lesions- none, excoriation- none Lymphadenopathy- none Head- atraumatic            Eyes- Gross vision intact, PERRLA, conjunctivae clear secretions            Ears- Hearing, canals-normal            Nose- Clear, no-Septal dev, mucus, polyps, erosion, perforation             Throat- Mallampati II , mucosa a little red , drainage- none, tonsils- atrophic Neck- flexible , trachea midline, no stridor , thyroid nl, carotid no bruit Chest - symmetrical excursion , unlabored           Heart/CV- RRR , no murmur , no gallop  , no rub, nl s1 s2                           - JVD- none , edema-  none, stasis changes- none, varices- none           Lung- coarse breath sounds without distinct wheeze or rhonchi, cough- none , dullness-none, rub- none           Chest wall-  Abd- tender-no, distended-no, bowel sounds-present, HSM- no Br/ Gen/ Rectal- Not done, not indicated Extrem- cyanosis- none, clubbing, none, atrophy- none, strength- nl Neuro- grossly intact to observation         Assessment & Plan:

## 2011-05-24 NOTE — Patient Instructions (Signed)
Flu vax  Scripts for Zpak and prednisone sent  I will explore whether it would be possible to get your allergy shots at our new [place] office.

## 2011-05-24 NOTE — Assessment & Plan Note (Signed)
Sinusitis and bronchitis Plan depo 80, Zpak (her preference)

## 2011-05-25 ENCOUNTER — Ambulatory Visit: Payer: Medicare Other | Admitting: Internal Medicine

## 2011-05-25 ENCOUNTER — Telehealth: Payer: Self-pay | Admitting: Internal Medicine

## 2011-05-25 NOTE — Telephone Encounter (Signed)
Notes Recorded by Waymon Budge, MD on 05/24/2011 at 5:13 PM CXR- stable chronic changes and scarring. No new process and no obvious reason for pains in right side.   Spoke with and notified of the above results and she verbalized understanding.

## 2011-05-29 ENCOUNTER — Ambulatory Visit (INDEPENDENT_AMBULATORY_CARE_PROVIDER_SITE_OTHER): Payer: Medicare Other

## 2011-05-29 DIAGNOSIS — J309 Allergic rhinitis, unspecified: Secondary | ICD-10-CM

## 2011-05-30 ENCOUNTER — Ambulatory Visit (INDEPENDENT_AMBULATORY_CARE_PROVIDER_SITE_OTHER): Payer: Medicare Other

## 2011-05-30 DIAGNOSIS — J309 Allergic rhinitis, unspecified: Secondary | ICD-10-CM

## 2011-06-04 DIAGNOSIS — Z23 Encounter for immunization: Secondary | ICD-10-CM

## 2011-06-05 ENCOUNTER — Ambulatory Visit (INDEPENDENT_AMBULATORY_CARE_PROVIDER_SITE_OTHER): Payer: Medicare Other

## 2011-06-05 DIAGNOSIS — J309 Allergic rhinitis, unspecified: Secondary | ICD-10-CM

## 2011-06-05 DIAGNOSIS — Z23 Encounter for immunization: Secondary | ICD-10-CM

## 2011-06-08 ENCOUNTER — Encounter: Payer: Self-pay | Admitting: Internal Medicine

## 2011-06-12 ENCOUNTER — Ambulatory Visit (INDEPENDENT_AMBULATORY_CARE_PROVIDER_SITE_OTHER): Payer: Medicare Other

## 2011-06-12 DIAGNOSIS — J309 Allergic rhinitis, unspecified: Secondary | ICD-10-CM

## 2011-06-21 ENCOUNTER — Ambulatory Visit (INDEPENDENT_AMBULATORY_CARE_PROVIDER_SITE_OTHER): Payer: Medicare Other

## 2011-06-21 DIAGNOSIS — J309 Allergic rhinitis, unspecified: Secondary | ICD-10-CM

## 2011-07-02 ENCOUNTER — Ambulatory Visit (INDEPENDENT_AMBULATORY_CARE_PROVIDER_SITE_OTHER): Payer: Medicare Other

## 2011-07-02 DIAGNOSIS — J309 Allergic rhinitis, unspecified: Secondary | ICD-10-CM

## 2011-07-11 ENCOUNTER — Ambulatory Visit (INDEPENDENT_AMBULATORY_CARE_PROVIDER_SITE_OTHER): Payer: Medicare Other

## 2011-07-11 DIAGNOSIS — J309 Allergic rhinitis, unspecified: Secondary | ICD-10-CM

## 2011-07-16 ENCOUNTER — Encounter: Payer: Self-pay | Admitting: Internal Medicine

## 2011-07-16 ENCOUNTER — Ambulatory Visit (INDEPENDENT_AMBULATORY_CARE_PROVIDER_SITE_OTHER): Payer: Medicare Other | Admitting: Internal Medicine

## 2011-07-16 ENCOUNTER — Ambulatory Visit (INDEPENDENT_AMBULATORY_CARE_PROVIDER_SITE_OTHER): Payer: Medicare Other

## 2011-07-16 ENCOUNTER — Telehealth: Payer: Self-pay | Admitting: *Deleted

## 2011-07-16 VITALS — BP 122/60 | HR 89 | Ht 62.0 in | Wt 135.2 lb

## 2011-07-16 DIAGNOSIS — J301 Allergic rhinitis due to pollen: Secondary | ICD-10-CM

## 2011-07-16 DIAGNOSIS — K219 Gastro-esophageal reflux disease without esophagitis: Secondary | ICD-10-CM

## 2011-07-16 DIAGNOSIS — J309 Allergic rhinitis, unspecified: Secondary | ICD-10-CM

## 2011-07-16 MED ORDER — OMEPRAZOLE 40 MG PO CPDR: 40.0000 mg | DELAYED_RELEASE_CAPSULE | Freq: Every day | ORAL | Status: DC

## 2011-07-16 NOTE — Telephone Encounter (Signed)
Pt had Medicare wellness OV in April. Do you want pt to scheduled appointment to go over meds?

## 2011-07-16 NOTE — Patient Instructions (Signed)
Continue present treatment

## 2011-07-16 NOTE — Telephone Encounter (Signed)
Please schedule a "medicare wellness" appointment.  We'll go over meds then

## 2011-07-16 NOTE — Telephone Encounter (Signed)
i sent rx 

## 2011-07-16 NOTE — Progress Notes (Signed)
Patient ID: Samantha [name], female    DOB: July 05, 1946, 65 y.o.   MRN: [id number]  HPI 01/15/11- 28 yoF never smoker,  Followed for allergic rhinitis and ashtma with GERD.  Last here September 05, 2010. Did well since last here, but was dx'd with fibromyalgia/ Dr Titus Dubin,  then suspected statin induced muscle pains.  Says breathing has been good. Needed to use rescue inhaler twice for some tightness after being out in wind/ pollen. Continues allergy vaccine, reporting occasional local reaction at 1:50.  05/24/11- 64 yoF never smoker,  Followed for allergic rhinitis and ashtma with GERD. 5 weeks head and throat congestion with bad sore throat. Samantha [name] to Dr Everardo All- treated doxy as sinusitis- finished x1 week ago. Marland Kitchen Has had laryngitis and tussive right lateral rib pains x 2 days. Denies change in chronic fibromyalgia pains,  She has to travel a long way to get here from [place] and is interested if we can get her allergy shots given through the [place] office. We discussed antibiotic choices.  07/16/11- 64 yoF never smoker,  Followed for allergic rhinitis and asthma with GERD. She is feeling well. We were not able to work out an arrangement for her to get her allergy shots in [place]. She feels allergy control is much better on vaccine but does get some local soreness at the injection sites, which are rotated. Her GERD has been active.  Review of Systems Constitutional:   No weight loss, night sweats,  Fevers, chills, fatigue, lassitude. HEENT:   No headaches,  Difficulty swallowing,  Tooth/dental problems, + Sore throat,                No sneezing, itching, ear ache,   +nasal congestion, post nasal drip,  CV:  + chest pain,  Orthopnea, PND, swelling in lower extremities, anasarca, dizziness, palpitations GI  No heartburn, indigestion, abdominal pain, nausea, vomiting, diarrhea, change in bowel habits, loss of appetite Resp: No shortness of breath with exertion or at rest.  No excess mucus, no  productive cough,  No non-productive cough,  No coughing up of blood.  No change in color of mucus.  No wheezing.   Skin: no rash or lesions. GU: no dysuria, change in color of urine, no urgency or frequency.  No flank pain. MS:  No joint pain or swelling.  No decreased range of motion.  No back pain. Psych:  No change in mood or affect. No depression or anxiety.  No memory loss.    Objective:   Physical Exam General- Alert, Oriented, Affect-appropriate, Distress- none acute; pleasant and talkative; well appearing Skin- rash-none, lesions- none, excoriation- none Lymphadenopathy- none Head- atraumatic            Eyes- Gross vision intact, PERRLA, conjunctivae clear secretions            Ears- Hearing, canals-normal            Nose- Clear, no-Septal dev, mucus, polyps, erosion, perforation             Throat- Mallampati II , mucosa a little red , drainage- none, tonsils- atrophic; frequent throat clearing Neck- flexible , trachea midline, no stridor , thyroid nl, carotid no bruit Chest - symmetrical excursion , unlabored           Heart/CV- RRR , no murmur , no gallop  , no rub, nl s1 s2                           -  JVD- none , edema- none, stasis changes- none, varices- none           Lung- coarse breath sounds without distinct wheeze or rhonchi, cough- none , dullness-none, rub- none           Chest wall-  Abd- tender-no, distended-no, bowel sounds-present, HSM- no Br/ Gen/ Rectal- Not done, not indicated Extrem- cyanosis- none, clubbing, none, atrophy- none, strength- nl Neuro- grossly intact to observation

## 2011-07-16 NOTE — Telephone Encounter (Signed)
Pt wants rx sent to [facility], informed pt to callback with fax # to [facility] so that we can fax rx.

## 2011-07-16 NOTE — Telephone Encounter (Signed)
R'cd fax from [facility] stating that they will no longer fill rx for Prevacid. Pt is requesting an rx for Omeprazole 90 day supply to be sent to Pharmacy.

## 2011-07-17 ENCOUNTER — Telehealth: Payer: Self-pay | Admitting: Internal Medicine

## 2011-07-17 MED ORDER — OMEPRAZOLE 40 MG PO CPDR: 40.0000 mg | DELAYED_RELEASE_CAPSULE | Freq: Every day | ORAL | Status: DC

## 2011-07-17 MED ORDER — CETIRIZINE HCL 10 MG PO TABS: 10.0000 mg | ORAL_TABLET | Freq: Every day | ORAL | Status: DC

## 2011-07-17 NOTE — Assessment & Plan Note (Signed)
Well-controlled on allergy vaccine. We are holding the dose at 1:50.

## 2011-07-17 NOTE — Telephone Encounter (Signed)
RX faxed to [facility], pt informed.

## 2011-07-17 NOTE — Telephone Encounter (Signed)
Rx printed, signed, and fax as requested per patient. Pt aware.

## 2011-07-17 NOTE — Telephone Encounter (Signed)
Rx printed to be faxed to [facility]-awaiting MD's signature

## 2011-07-17 NOTE — Assessment & Plan Note (Signed)
Suggested that she ask Dr. Everardo All to change her acid blocker something covered by the [facility] hospital formulary.

## 2011-07-23 ENCOUNTER — Ambulatory Visit (INDEPENDENT_AMBULATORY_CARE_PROVIDER_SITE_OTHER): Payer: Medicare Other

## 2011-07-23 DIAGNOSIS — J309 Allergic rhinitis, unspecified: Secondary | ICD-10-CM

## 2011-07-30 ENCOUNTER — Ambulatory Visit (INDEPENDENT_AMBULATORY_CARE_PROVIDER_SITE_OTHER): Payer: Medicare Other

## 2011-07-30 DIAGNOSIS — J309 Allergic rhinitis, unspecified: Secondary | ICD-10-CM

## 2011-07-31 ENCOUNTER — Telehealth: Payer: Self-pay

## 2011-07-31 NOTE — Telephone Encounter (Signed)
Left message for pt to callback office.  

## 2011-07-31 NOTE — Telephone Encounter (Signed)
All you need to do is shower as usual.  Come in if you get a sore in the skin.

## 2011-07-31 NOTE — Telephone Encounter (Signed)
Pt called stating she came into contact with an individual Dx with MRSA yesterday (greeted person with a kiss on the cheek). Pt is concerned that she may contract MRSA, please advise.

## 2011-07-31 NOTE — Telephone Encounter (Signed)
Pt informed of MD's advisement. 

## 2011-08-06 ENCOUNTER — Ambulatory Visit (INDEPENDENT_AMBULATORY_CARE_PROVIDER_SITE_OTHER): Payer: Medicare Other

## 2011-08-06 DIAGNOSIS — J309 Allergic rhinitis, unspecified: Secondary | ICD-10-CM

## 2011-08-14 ENCOUNTER — Ambulatory Visit (INDEPENDENT_AMBULATORY_CARE_PROVIDER_SITE_OTHER): Payer: Medicare Other

## 2011-08-14 DIAGNOSIS — J309 Allergic rhinitis, unspecified: Secondary | ICD-10-CM

## 2011-08-21 ENCOUNTER — Ambulatory Visit (INDEPENDENT_AMBULATORY_CARE_PROVIDER_SITE_OTHER): Payer: Medicare Other

## 2011-08-21 DIAGNOSIS — J309 Allergic rhinitis, unspecified: Secondary | ICD-10-CM

## 2011-08-27 ENCOUNTER — Ambulatory Visit (INDEPENDENT_AMBULATORY_CARE_PROVIDER_SITE_OTHER): Payer: Medicare Other

## 2011-08-27 DIAGNOSIS — J309 Allergic rhinitis, unspecified: Secondary | ICD-10-CM

## 2011-09-12 ENCOUNTER — Ambulatory Visit (INDEPENDENT_AMBULATORY_CARE_PROVIDER_SITE_OTHER): Payer: Medicare Other

## 2011-09-12 DIAGNOSIS — J309 Allergic rhinitis, unspecified: Secondary | ICD-10-CM

## 2011-09-13 ENCOUNTER — Other Ambulatory Visit: Payer: Self-pay | Admitting: Obstetrics and Gynecology

## 2011-09-13 DIAGNOSIS — Z1231 Encounter for screening mammogram for malignant neoplasm of breast: Secondary | ICD-10-CM

## 2011-09-17 ENCOUNTER — Ambulatory Visit (INDEPENDENT_AMBULATORY_CARE_PROVIDER_SITE_OTHER): Payer: Medicare Other

## 2011-09-17 ENCOUNTER — Ambulatory Visit
Admission: RE | Admit: 2011-09-17 | Discharge: 2011-09-17 | Disposition: A | Discharge status: Home / Self Care | Payer: Medicare Other | Source: Ambulatory Visit | Attending: Obstetrics and Gynecology | Admitting: Obstetrics and Gynecology

## 2011-09-17 DIAGNOSIS — Z1231 Encounter for screening mammogram for malignant neoplasm of breast: Secondary | ICD-10-CM

## 2011-09-17 DIAGNOSIS — J309 Allergic rhinitis, unspecified: Secondary | ICD-10-CM | POA: Diagnosis not present

## 2011-09-17 LAB — HM MAMMOGRAPHY: HM Mammogram: NORMAL

## 2011-09-18 ENCOUNTER — Other Ambulatory Visit: Payer: Self-pay

## 2011-09-18 MED ORDER — LOSARTAN POTASSIUM-HCTZ 100-12.5 MG PO TABS: 1.0000 | ORAL_TABLET | Freq: Every day | ORAL | Status: DC

## 2011-09-25 ENCOUNTER — Encounter: Payer: Self-pay | Admitting: Internal Medicine

## 2011-10-01 ENCOUNTER — Ambulatory Visit (INDEPENDENT_AMBULATORY_CARE_PROVIDER_SITE_OTHER): Payer: Medicare Other

## 2011-10-01 DIAGNOSIS — J309 Allergic rhinitis, unspecified: Secondary | ICD-10-CM | POA: Diagnosis not present

## 2011-10-08 ENCOUNTER — Ambulatory Visit (INDEPENDENT_AMBULATORY_CARE_PROVIDER_SITE_OTHER): Payer: Medicare Other

## 2011-10-08 DIAGNOSIS — J309 Allergic rhinitis, unspecified: Secondary | ICD-10-CM | POA: Diagnosis not present

## 2011-10-09 ENCOUNTER — Ambulatory Visit (INDEPENDENT_AMBULATORY_CARE_PROVIDER_SITE_OTHER): Payer: Medicare Other

## 2011-10-09 DIAGNOSIS — J309 Allergic rhinitis, unspecified: Secondary | ICD-10-CM

## 2011-10-15 ENCOUNTER — Ambulatory Visit (INDEPENDENT_AMBULATORY_CARE_PROVIDER_SITE_OTHER): Payer: Medicare Other

## 2011-10-15 ENCOUNTER — Other Ambulatory Visit: Payer: Self-pay | Admitting: *Deleted

## 2011-10-15 DIAGNOSIS — J309 Allergic rhinitis, unspecified: Secondary | ICD-10-CM | POA: Diagnosis not present

## 2011-10-15 MED ORDER — EPINEPHRINE 0.3 MG/0.3ML IJ DEVI: INTRAMUSCULAR | Status: DC

## 2011-10-23 ENCOUNTER — Ambulatory Visit (INDEPENDENT_AMBULATORY_CARE_PROVIDER_SITE_OTHER): Payer: Medicare Other

## 2011-10-23 DIAGNOSIS — J309 Allergic rhinitis, unspecified: Secondary | ICD-10-CM | POA: Diagnosis not present

## 2011-10-29 ENCOUNTER — Ambulatory Visit (INDEPENDENT_AMBULATORY_CARE_PROVIDER_SITE_OTHER): Payer: Medicare Other

## 2011-10-29 DIAGNOSIS — J309 Allergic rhinitis, unspecified: Secondary | ICD-10-CM

## 2011-11-05 ENCOUNTER — Ambulatory Visit (INDEPENDENT_AMBULATORY_CARE_PROVIDER_SITE_OTHER): Payer: Medicare Other

## 2011-11-05 DIAGNOSIS — J309 Allergic rhinitis, unspecified: Secondary | ICD-10-CM

## 2011-11-12 ENCOUNTER — Ambulatory Visit (INDEPENDENT_AMBULATORY_CARE_PROVIDER_SITE_OTHER): Payer: Medicare Other

## 2011-11-12 DIAGNOSIS — J309 Allergic rhinitis, unspecified: Secondary | ICD-10-CM | POA: Diagnosis not present

## 2011-11-19 ENCOUNTER — Ambulatory Visit (INDEPENDENT_AMBULATORY_CARE_PROVIDER_SITE_OTHER): Payer: Medicare Other

## 2011-11-19 DIAGNOSIS — J309 Allergic rhinitis, unspecified: Secondary | ICD-10-CM | POA: Diagnosis not present

## 2011-11-26 ENCOUNTER — Ambulatory Visit (INDEPENDENT_AMBULATORY_CARE_PROVIDER_SITE_OTHER): Payer: Medicare Other

## 2011-11-26 DIAGNOSIS — M5030 Other cervical disc degeneration, unspecified cervical region: Secondary | ICD-10-CM | POA: Diagnosis not present

## 2011-11-26 DIAGNOSIS — J309 Allergic rhinitis, unspecified: Secondary | ICD-10-CM

## 2011-11-26 DIAGNOSIS — IMO0001 Reserved for inherently not codable concepts without codable children: Secondary | ICD-10-CM | POA: Diagnosis not present

## 2011-11-26 DIAGNOSIS — M25549 Pain in joints of unspecified hand: Secondary | ICD-10-CM | POA: Diagnosis not present

## 2011-11-26 DIAGNOSIS — R5381 Other malaise: Secondary | ICD-10-CM | POA: Diagnosis not present

## 2011-12-03 ENCOUNTER — Telehealth: Payer: Self-pay | Admitting: Internal Medicine

## 2011-12-03 MED ORDER — AMOXICILLIN-POT CLAVULANATE 875-125 MG PO TABS: 1.0000 | ORAL_TABLET | Freq: Two times a day (BID) | ORAL | Status: DC

## 2011-12-03 NOTE — Telephone Encounter (Signed)
I spoke with pt and is aware of CDY recs, She voiced her understanding and rx has been sent to the pharmacy. Nothing further was needed

## 2011-12-03 NOTE — Telephone Encounter (Signed)
Offer augmentin 875 mg, # 20, 1 twice daily   No ref.

## 2011-12-03 NOTE — Telephone Encounter (Signed)
Spoke with pt. She is c/o bloody yellow nasal d/c, prod cough with clear sputum, and HA/sinus pressure- onset was 1 wk ago and started with sore throat which has now resolved. She states taking mucinex and saline rinses already with little relief. She is requesting appt. I advised nothing available with CDY. She wants something called in, but does not want a zithromax b/c "has heard bad things on the news". Please advise, thanks! Allergies  Allergen Reactions  . Cefuroxime Axetil     REACTION: pt not sure  . Sulfonamide Derivatives     REACTION: pt not sure

## 2011-12-11 ENCOUNTER — Ambulatory Visit (INDEPENDENT_AMBULATORY_CARE_PROVIDER_SITE_OTHER): Payer: Medicare Other

## 2011-12-11 DIAGNOSIS — J309 Allergic rhinitis, unspecified: Secondary | ICD-10-CM | POA: Diagnosis not present

## 2011-12-11 DIAGNOSIS — M79609 Pain in unspecified limb: Secondary | ICD-10-CM | POA: Diagnosis not present

## 2011-12-11 DIAGNOSIS — IMO0001 Reserved for inherently not codable concepts without codable children: Secondary | ICD-10-CM | POA: Diagnosis not present

## 2011-12-11 DIAGNOSIS — M25649 Stiffness of unspecified hand, not elsewhere classified: Secondary | ICD-10-CM | POA: Diagnosis not present

## 2011-12-17 ENCOUNTER — Telehealth: Payer: Self-pay | Admitting: Internal Medicine

## 2011-12-17 ENCOUNTER — Ambulatory Visit (INDEPENDENT_AMBULATORY_CARE_PROVIDER_SITE_OTHER): Payer: Medicare Other

## 2011-12-17 DIAGNOSIS — IMO0001 Reserved for inherently not codable concepts without codable children: Secondary | ICD-10-CM | POA: Diagnosis not present

## 2011-12-17 DIAGNOSIS — J309 Allergic rhinitis, unspecified: Secondary | ICD-10-CM | POA: Diagnosis not present

## 2011-12-17 DIAGNOSIS — M79609 Pain in unspecified limb: Secondary | ICD-10-CM | POA: Diagnosis not present

## 2011-12-17 DIAGNOSIS — M25649 Stiffness of unspecified hand, not elsewhere classified: Secondary | ICD-10-CM | POA: Diagnosis not present

## 2011-12-17 MED ORDER — MONTELUKAST SODIUM 10 MG PO TABS: 10.0000 mg | ORAL_TABLET | Freq: Every day | ORAL | Status: DC

## 2011-12-17 NOTE — Telephone Encounter (Signed)
PT STOPPED IN AND NEEDS RX SENT TO [facility] IN [place]. PRINTED RX FOR CY TO SIGN.

## 2011-12-20 MED ORDER — MONTELUKAST SODIUM 10 MG PO TABS: 10.0000 mg | ORAL_TABLET | Freq: Every day | ORAL | Status: DC

## 2011-12-25 ENCOUNTER — Ambulatory Visit (INDEPENDENT_AMBULATORY_CARE_PROVIDER_SITE_OTHER): Payer: Medicare Other

## 2011-12-25 DIAGNOSIS — J309 Allergic rhinitis, unspecified: Secondary | ICD-10-CM | POA: Diagnosis not present

## 2011-12-31 ENCOUNTER — Ambulatory Visit (INDEPENDENT_AMBULATORY_CARE_PROVIDER_SITE_OTHER): Payer: Medicare Other

## 2011-12-31 DIAGNOSIS — J309 Allergic rhinitis, unspecified: Secondary | ICD-10-CM | POA: Diagnosis not present

## 2012-01-07 ENCOUNTER — Telehealth: Payer: Self-pay | Admitting: Endocrinology

## 2012-01-07 DIAGNOSIS — E041 Nontoxic single thyroid nodule: Secondary | ICD-10-CM

## 2012-01-07 DIAGNOSIS — E7800 Pure hypercholesterolemia, unspecified: Secondary | ICD-10-CM

## 2012-01-07 DIAGNOSIS — Z0000 Encounter for general adult medical examination without abnormal findings: Secondary | ICD-10-CM

## 2012-01-07 DIAGNOSIS — R739 Hyperglycemia, unspecified: Secondary | ICD-10-CM

## 2012-01-07 NOTE — Telephone Encounter (Signed)
Message copied by Carin Primrose on Mon Jan 07, 2012  4:57 PM ------      Message from: Newell Coral      Created: Mon Jan 07, 2012 10:44 AM      Regarding: cpe sch, needs labs       The pt scheduled has scheduled her cpe and is hoping to get labs done before. Thanks!

## 2012-01-07 NOTE — Telephone Encounter (Signed)
Labs entered into Epic upcoming CPX  appointment.

## 2012-01-10 ENCOUNTER — Ambulatory Visit (INDEPENDENT_AMBULATORY_CARE_PROVIDER_SITE_OTHER): Payer: Medicare Other

## 2012-01-10 DIAGNOSIS — J309 Allergic rhinitis, unspecified: Secondary | ICD-10-CM

## 2012-01-10 DIAGNOSIS — Z01419 Encounter for gynecological examination (general) (routine) without abnormal findings: Secondary | ICD-10-CM | POA: Diagnosis not present

## 2012-01-10 LAB — HM PAP SMEAR

## 2012-01-14 ENCOUNTER — Encounter: Payer: Self-pay | Admitting: Internal Medicine

## 2012-01-14 ENCOUNTER — Ambulatory Visit (INDEPENDENT_AMBULATORY_CARE_PROVIDER_SITE_OTHER): Payer: Medicare Other

## 2012-01-14 ENCOUNTER — Ambulatory Visit (INDEPENDENT_AMBULATORY_CARE_PROVIDER_SITE_OTHER): Payer: Medicare Other | Admitting: Internal Medicine

## 2012-01-14 VITALS — BP 118/68 | HR 83 | Ht 62.0 in | Wt 139.4 lb

## 2012-01-14 DIAGNOSIS — I7300 Raynaud's syndrome without gangrene: Secondary | ICD-10-CM | POA: Diagnosis not present

## 2012-01-14 DIAGNOSIS — J45998 Other asthma: Secondary | ICD-10-CM

## 2012-01-14 DIAGNOSIS — J45909 Unspecified asthma, uncomplicated: Secondary | ICD-10-CM | POA: Diagnosis not present

## 2012-01-14 DIAGNOSIS — J301 Allergic rhinitis due to pollen: Secondary | ICD-10-CM | POA: Diagnosis not present

## 2012-01-14 DIAGNOSIS — J309 Allergic rhinitis, unspecified: Secondary | ICD-10-CM | POA: Diagnosis not present

## 2012-01-14 MED ORDER — MONTELUKAST SODIUM 10 MG PO TABS: ORAL_TABLET | ORAL | Status: DC

## 2012-01-14 NOTE — Patient Instructions (Signed)
Singulair/ montelukast refilled  Continue allergy vaccine at 1:50  [employer] form

## 2012-01-14 NOTE — Progress Notes (Signed)
Patient ID: Samantha [name], female    DOB: 06/10/1946, 66 y.o.   MRN: [id number]  HPI 01/15/11- 73 yoF never smoker,  Followed for allergic rhinitis and ashtma with GERD.  Last here September 05, 2010. Did well since last here, but was dx'd with fibromyalgia/ Dr Titus Dubin,  then suspected statin induced muscle pains.  Says breathing has been good. Needed to use rescue inhaler twice for some tightness after being out in wind/ pollen. Continues allergy vaccine, reporting occasional local reaction at 1:50.  05/24/11- 64 yoF never smoker,  Followed for allergic rhinitis and ashtma with GERD. 5 weeks head and throat congestion with bad sore throat. [name] to Dr Everardo All- treated doxy as sinusitis- finished x1 week ago. Marland Kitchen Has had laryngitis and tussive right lateral rib pains x 2 days. Denies change in chronic fibromyalgia pains,  She has to travel a long way to get here from [place] and is interested if we can get her allergy shots given through the [place] office. We discussed antibiotic choices.  07/16/11- 64 yoF never smoker,  Followed for allergic rhinitis and asthma with GERD. She is feeling well. We were not able to work out an arrangement for her to get her allergy shots in [place]. She feels allergy control is much better on vaccine but does get some local soreness at the injection sites, which are rotated. Her GERD has been active.  01/14/12- 64 yoF never smoker,  Followed for allergic rhinitis and asthma with GERD. Breathing doing great; no asthma attacks; recently had flare up of sinus infection; still on vaccine and doing well On only one day she needed rescue inhaler after smoke exposure. We had called in an antibiotic for sinusitis-worked well. Continues allergy vaccine at 1:50 with injections here. History of Raynaud's phenomenon. Has to wear gloves indoors in the summer. Asked-allow a [employer] form.  Review of Systems-see HPI Constitutional:   No weight loss, night sweats,   Fevers, chills, fatigue, lassitude. HEENT:   No headaches,  Difficulty swallowing,  Tooth/dental problems, + Sore throat,                No sneezing, itching, ear ache,   +nasal congestion, post nasal drip, Melampatti III. R TM retracted CV:  + chest pain,  Orthopnea, PND, swelling in lower extremities, anasarca, dizziness, palpitations GI  No heartburn, indigestion, abdominal pain, nausea, vomiting, diarrhea, change in bowel habits, loss of appetite Resp: No shortness of breath with exertion or at rest.  No excess mucus, no productive cough,  No non-productive cough,  No coughing up of blood.  No change in color of mucus.  No wheezing.   Skin: no rash or lesions. GU: no dysuria, change in color of urine, no urgency or frequency.  No flank pain. MS:  No joint pain or swelling.  No decreased range of motion.  No back pain. Psych:  No change in mood or affect. No depression or anxiety.  No memory loss.    Objective:   Physical Exam General- Alert, Oriented, Affect-appropriate, Distress- none acute; pleasant and talkative; well appearing Skin- rash-none, lesions- none, excoriation- none Lymphadenopathy- none Head- atraumatic            Eyes- Gross vision intact, PERRLA, conjunctivae clear secretions            Ears- Hearing, canals-normal            Nose- Clear, no-Septal dev, mucus, polyps, erosion, perforation  Throat- Melampatti III.  R TM retracted , mucosa a little red , drainage- none, tonsils- atrophic; frequent throat clearing Neck- flexible , trachea midline, no stridor , thyroid nl, carotid no bruit Chest - symmetrical excursion , unlabored           Heart/CV- RRR , no murmur , no gallop  , no rub, nl s1 s2                           - JVD- none , edema- none, stasis changes- none, varices- none           Lung- coarse breath sounds without distinct wheeze or rhonchi, cough- none , dullness-none, rub- none           Chest wall-  Abd- tender-no, distended-no, bowel  sounds-present, HSM- no Br/ Gen/ Rectal- Not done, not indicated Extrem- cyanosis- none, clubbing, none, atrophy- none, strength- nl. + hands dry/ cool Neuro- grossly intact to observation

## 2012-01-15 DIAGNOSIS — H40009 Preglaucoma, unspecified, unspecified eye: Secondary | ICD-10-CM | POA: Diagnosis not present

## 2012-01-17 NOTE — Assessment & Plan Note (Signed)
Uncomfortable without mittens on her hands. Rheumatology follow-up up directed.

## 2012-01-17 NOTE — Assessment & Plan Note (Signed)
Plan-refill Singulair and continue vaccine.

## 2012-01-17 NOTE — Assessment & Plan Note (Signed)
Good compliance and control. We are continuing vaccine at 1:50.

## 2012-01-22 ENCOUNTER — Ambulatory Visit (INDEPENDENT_AMBULATORY_CARE_PROVIDER_SITE_OTHER): Payer: Medicare Other | Admitting: Endocrinology

## 2012-01-22 ENCOUNTER — Ambulatory Visit (INDEPENDENT_AMBULATORY_CARE_PROVIDER_SITE_OTHER): Payer: Medicare Other

## 2012-01-22 ENCOUNTER — Encounter: Payer: Self-pay | Admitting: Endocrinology

## 2012-01-22 ENCOUNTER — Other Ambulatory Visit (INDEPENDENT_AMBULATORY_CARE_PROVIDER_SITE_OTHER): Payer: Medicare Other

## 2012-01-22 VITALS — BP 128/72 | HR 64 | Temp 98.1°F | Ht 62.0 in | Wt 137.0 lb

## 2012-01-22 DIAGNOSIS — R7309 Other abnormal glucose: Secondary | ICD-10-CM

## 2012-01-22 DIAGNOSIS — R6889 Other general symptoms and signs: Secondary | ICD-10-CM

## 2012-01-22 DIAGNOSIS — R739 Hyperglycemia, unspecified: Secondary | ICD-10-CM

## 2012-01-22 DIAGNOSIS — E7800 Pure hypercholesterolemia, unspecified: Secondary | ICD-10-CM

## 2012-01-22 DIAGNOSIS — J309 Allergic rhinitis, unspecified: Secondary | ICD-10-CM

## 2012-01-22 DIAGNOSIS — M810 Age-related osteoporosis without current pathological fracture: Secondary | ICD-10-CM

## 2012-01-22 DIAGNOSIS — Z0000 Encounter for general adult medical examination without abnormal findings: Secondary | ICD-10-CM | POA: Diagnosis not present

## 2012-01-22 DIAGNOSIS — I10 Essential (primary) hypertension: Secondary | ICD-10-CM | POA: Diagnosis not present

## 2012-01-22 DIAGNOSIS — Z23 Encounter for immunization: Secondary | ICD-10-CM

## 2012-01-22 DIAGNOSIS — E041 Nontoxic single thyroid nodule: Secondary | ICD-10-CM

## 2012-01-22 LAB — CBC WITH DIFFERENTIAL/PLATELET
Basophils Absolute: 0 10*3/uL (ref 0.0–0.1)
Basophils Relative: 0.2 % (ref 0.0–3.0)
Eosinophils Absolute: 0.1 10*3/uL (ref 0.0–0.7)
Eosinophils Relative: 1 % (ref 0.0–5.0)
HCT: 40.1 % (ref 36.0–46.0)
Hemoglobin: 13.5 g/dL (ref 12.0–15.0)
Lymphocytes Relative: 39.1 % (ref 12.0–46.0)
Lymphs Abs: 2.5 10*3/uL (ref 0.7–4.0)
MCHC: 33.8 g/dL (ref 30.0–36.0)
MCV: 86.1 fl (ref 78.0–100.0)
Monocytes Absolute: 0.4 10*3/uL (ref 0.1–1.0)
Monocytes Relative: 5.8 % (ref 3.0–12.0)
Neutro Abs: 3.5 10*3/uL (ref 1.4–7.7)
Neutrophils Relative %: 53.9 % (ref 43.0–77.0)
Platelets: 205 10*3/uL (ref 150.0–400.0)
RBC: 4.66 Mil/uL (ref 3.87–5.11)
RDW: 14.8 % — ABNORMAL HIGH (ref 11.5–14.6)
WBC: 6.4 10*3/uL (ref 4.5–10.5)

## 2012-01-22 LAB — BASIC METABOLIC PANEL
BUN: 15 mg/dL (ref 6–23)
CO2: 30 mEq/L (ref 19–32)
Calcium: 10.2 mg/dL (ref 8.4–10.5)
Chloride: 102 mEq/L (ref 96–112)
Creatinine, Ser: 0.8 mg/dL (ref 0.4–1.2)
GFR: 96.32 mL/min (ref >60.00)
Glucose, Bld: 91 mg/dL (ref 70–99)
Potassium: 4.1 mEq/L (ref 3.5–5.1)
Sodium: 140 mEq/L (ref 135–145)

## 2012-01-22 LAB — URINALYSIS, ROUTINE W REFLEX MICROSCOPIC
Bilirubin Urine: NEGATIVE
Hgb urine dipstick: NEGATIVE
Ketones, ur: NEGATIVE
Leukocytes, UA: NEGATIVE
Nitrite: NEGATIVE
Specific Gravity, Urine: <=1.005 (ref 1.000–1.030)
Total Protein, Urine: NEGATIVE
Urine Glucose: NEGATIVE
Urobilinogen, UA: 0.2 (ref 0.0–1.0)
pH: 6.5 (ref 5.0–8.0)

## 2012-01-22 LAB — TSH: TSH: 1.95 u[IU]/mL (ref 0.35–5.50)

## 2012-01-22 LAB — LIPID PANEL
Cholesterol: 200 mg/dL (ref 0–200)
HDL: 101.5 mg/dL (ref >39.00)
LDL Cholesterol: 79 mg/dL (ref 0–99)
Total CHOL/HDL Ratio: 2
Triglycerides: 97 mg/dL (ref 0.0–149.0)
VLDL: 19.4 mg/dL (ref 0.0–40.0)

## 2012-01-22 LAB — HEPATIC FUNCTION PANEL
ALT: 15 U/L (ref 0–35)
AST: 27 U/L (ref 0–37)
Albumin: 4.3 g/dL (ref 3.5–5.2)
Alkaline Phosphatase: 78 U/L (ref 39–117)
Bilirubin, Direct: 0.1 mg/dL (ref 0.0–0.3)
Total Bilirubin: 0.4 mg/dL (ref 0.3–1.2)
Total Protein: 7.9 g/dL (ref 6.0–8.3)

## 2012-01-22 LAB — HEMOGLOBIN A1C: Hgb A1c MFr Bld: 6.1 % (ref 4.6–6.5)

## 2012-01-22 NOTE — Patient Instructions (Addendum)
blood tests are being requested for you today.  You will receive a letter with results. please consider these measures for your health:  minimize alcohol.  do not use tobacco products.  have a colonoscopy at least every 10 years from age 67.  Women should have an annual mammogram from age 33.  keep firearms safely stored.  always use seat belts.  have working smoke alarms in your home.  see an eye doctor and dentist regularly.  never drive under the influence of alcohol or drugs (including prescription drugs).   good diet and exercise habits significanly improve the control of your blood sugar.  please let me know if you wish to be referred to a dietician.  high blood sugar is very risky to your health.  you should see an eye doctor every year.

## 2012-01-22 NOTE — Progress Notes (Signed)
Subjective:    Patient ID: Samantha [name], female    DOB: 03/25/1946, 66 y.o.   MRN: [id number]  HPI Pt states few years of slight cold intolerance, worst at the hands and feet.  She has assoc paleness there.  Past Medical History  Diagnosis Date  . ASTHMA 04/14/2007  . HYPERCHOLESTEROLEMIA 01/07/2008  . HYPERTENSION 01/28/2008  . SINUSITIS 02/10/2009  . ALLERGIC RHINITIS 07/26/2008  . INSOMNIA 05/10/2008  . FIBROIDS, UTERUS 05/10/2008  . OSTEOPOROSIS 04/14/2007  . OSTEOARTHRITIS 11/28/2009  . GERD 04/14/2007  . Irritable bowel syndrome 04/06/2010  . CHEST PAIN 08/26/2008  . HYPERGLYCEMIA 11/28/2009  . 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 ASYMPTOMATIC POSTMENOPAUSAL STATUS 05/10/2008  . 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 Episcleritis     Past Surgical History  Procedure Date  . Nasal polyp surgery     Polypectomy and septoplasty  . Laparoscopy   . Neck injury 1983  . Stress cardiolite 02/13/2002  . Electrocardiogram 12/04/2006  . Colonoscopy w/ polypectomy     History   Social History  . Marital Status: Widowed    Spouse Name: N/A    Number of Children: N/A  . Years of Education: N/A   Occupational History  .      Retired Journalist, newspaper)   Social History Main Topics  . Smoking status: Never Smoker   . Smokeless tobacco: Never Used  . Alcohol Use: No  . Drug Use: No  . Sexually Active: Not on file   Other Topics Concern  . Not on file   Social History Narrative   Lives alone (widowed)    Current Outpatient Prescriptions on File Prior to Visit  Medication Sig Dispense Refill  . albuterol (PROVENTIL HFA) 108 (90 BASE) MCG/ACT inhaler Inhale 2 puffs into the lungs every 6 (six) hours as needed.  3 Inhaler  3  . Ascorbic Acid (VITAMIN C) 500 MG tablet Take 500 mg by mouth daily.        Marland Kitchen aspirin 81 MG tablet Take 81 mg by mouth daily.      . bifidobacterium infantis (ALIGN) capsule Take 1 capsule by mouth daily.        . calcium carbonate 200 MG capsule  Take 400 mg by mouth daily.      . cetirizine (ZYRTEC) 10 MG tablet Take 1 tablet (10 mg total) by mouth daily.  90 tablet  3  . Cholecalciferol (VITAMIN D3) 400 UNITS tablet Take 400 Units by mouth 2 (two) times daily with a meal.        . Coenzyme Q-10 60 MG CAPS Take 1 capsule by mouth 2 (two) times daily.       . cromolyn (NASALCROM) 5.2 MG/ACT nasal spray 1-2 sprays each nostril four times a day as needed       . Cyanocobalamin (VITAMIN B-12) 2500 MCG SUBL Place 1 tablet under the tongue daily. Contains Folic Acid, B6, and Biotin      . cyclobenzaprine (FLEXERIL) 10 MG tablet 1 tablet by mouth at bedtime as needed       . EPINEPHrine (EPIPEN) 0.3 mg/0.3 mL DEVI For severe allergic reaction  1 Device  11  . losartan-hydrochlorothiazide (HYZAAR) 100-12.5 MG per tablet Take 1 tablet by mouth daily.  90 tablet  1  . Magnesium Malate POWD (1000mg  tabs) 1 tablet by mouth three times a day       . Manganese Gluconate 50 MG TABS Take 1 tablet by  mouth daily.        . methocarbamol (ROBAXIN) 500 MG tablet Take 500 mg by mouth 2 (two) times daily as needed.       . montelukast (SINGULAIR) 10 MG tablet 1 daily  90 tablet  3  . omeprazole (PRILOSEC) 40 MG capsule Take 1 capsule (40 mg total) by mouth daily.  90 capsule  3  . thiamine 100 MG tablet Take 100 mg by mouth daily.          Allergies  Allergen Reactions  . Cefuroxime Axetil     REACTION: pt not sure  . Sulfonamide Derivatives     REACTION: pt not sure    Family History  Problem Relation Age of Onset  . Heart disease Father     CHF  . Cancer Neg Hx     No FH of Colon Cancer    BP 128/72  Pulse 64  Temp(Src) 98.1 F (36.7 C) (Oral)  Ht 5\' 2"  (1.575 m)  Wt 137 lb (62.143 kg)  BMI 25.06 kg/m2  SpO2 94%    Review of Systems  Constitutional: Negative for fever and unexpected weight change.  Cardiovascular: Negative for chest pain.       Objective:   Physical Exam VITAL SIGNS:  See vs page GENERAL: no distress NECK:  There is no palpable thyroid enlargement.  No thyroid nodule is palpable.  No palpable lymphadenopathy at the anterior neck. LUNGS:  Clear to auscultation HEART:  Regular rate and rhythm without murmurs noted. Normal S1,S2.   Pulses: dorsalis pedis intact bilat.  No carotid bruit. Feet: no deformity.  no ulcer on the feet.  feet are of normal color and temp.  no edema. Neuro: sensation is intact to touch on the feet     Lab Results  Component Value Date   WBC 6.4 01/22/2012   HGB 13.5 01/22/2012   HCT 40.1 01/22/2012   PLT 205.0 01/22/2012   GLUCOSE 91 01/22/2012   CHOL 200 01/22/2012   TRIG 97.0 01/22/2012   HDL 101.50 01/22/2012   LDLCALC 79 01/22/2012   ALT 15 01/22/2012   AST 27 01/22/2012   NA 140 01/22/2012   K 4.1 01/22/2012   CL 102 01/22/2012   CREATININE 0.8 01/22/2012   BUN 15 01/22/2012   CO2 30 01/22/2012   TSH 1.95 01/22/2012   HGBA1C 6.1 01/22/2012      Assessment & Plan:  Cold intolerance, new. uncertain etiology Thyroid nodule, apparently smaller on exam Hyperglycemia, stable Dyslipidemia, well-controlled      Subjective:   Patient here for Medicare annual wellness visit and management of other chronic and acute problems.     Risk factors: advanced age    Roster of Physicians Providing Medical Care to Patient:  See "snapshot"   Activities of Daily Living: In your present state of health, do you have any difficulty performing the following activities?:  Preparing food and eating?: No  Bathing yourself: No  Getting dressed: No  Using the toilet: No  Moving around from place to place: No  In the past year have you fallen or had a near fall?: No    Home Safety: Has smoke detector and wears seat belts. No firearms.   Diet and Exercise  Current exercise habits: pt says good Dietary issues discussed: pt reports a healthy diet   Depression Screen  Q1: Over the past two weeks, have you felt down, depressed or hopeless?no  Q2: Over the past two weeks, have you  felt little interest or pleasure in doing things? no   The following portions of the patient's history were reviewed and updated as appropriate: allergies, current medications, past family history, past medical history, past social history, past surgical history and problem list.  Past Medical History  Diagnosis Date  . ASTHMA 04/14/2007  . HYPERCHOLESTEROLEMIA 01/07/2008  . HYPERTENSION 01/28/2008  . SINUSITIS 02/10/2009  . ALLERGIC RHINITIS 07/26/2008  . INSOMNIA 05/10/2008  . FIBROIDS, UTERUS 05/10/2008  . OSTEOPOROSIS 04/14/2007  . OSTEOARTHRITIS 11/28/2009  . GERD 04/14/2007  . Irritable bowel syndrome 04/06/2010  . CHEST PAIN 08/26/2008  . HYPERGLYCEMIA 11/28/2009  . 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 ASYMPTOMATIC POSTMENOPAUSAL STATUS 05/10/2008  . 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 Episcleritis     Past Surgical History  Procedure Date  . Nasal polyp surgery     Polypectomy and septoplasty  . Laparoscopy   . Neck injury 1983  . Stress cardiolite 02/13/2002  . Electrocardiogram 12/04/2006  . Colonoscopy w/ polypectomy     History   Social History  . Marital Status: Widowed    Spouse Name: N/A    Number of Children: N/A  . Years of Education: N/A   Occupational History  .      Retired Journalist, newspaper)   Social History Main Topics  . Smoking status: Never Smoker   . Smokeless tobacco: Never Used  . Alcohol Use: No  . Drug Use: No  . Sexually Active: Not on file   Other Topics Concern  . Not on file   Social History Narrative   Lives alone (widowed)    Current Outpatient Prescriptions on File Prior to Visit  Medication Sig Dispense Refill  . albuterol (PROVENTIL HFA) 108 (90 BASE) MCG/ACT inhaler Inhale 2 puffs into the lungs every 6 (six) hours as needed.  3 Inhaler  3  . Ascorbic Acid (VITAMIN C) 500 MG tablet Take 500 mg by mouth daily.        Marland Kitchen aspirin 81 MG tablet Take 81 mg by mouth daily.      . bifidobacterium infantis (ALIGN) capsule Take  1 capsule by mouth daily.        . calcium carbonate 200 MG capsule Take 400 mg by mouth daily.      . cetirizine (ZYRTEC) 10 MG tablet Take 1 tablet (10 mg total) by mouth daily.  90 tablet  3  . Cholecalciferol (VITAMIN D3) 400 UNITS tablet Take 400 Units by mouth 2 (two) times daily with a meal.        . Coenzyme Q-10 60 MG CAPS Take 1 capsule by mouth 2 (two) times daily.       . cromolyn (NASALCROM) 5.2 MG/ACT nasal spray 1-2 sprays each nostril four times a day as needed       . Cyanocobalamin (VITAMIN B-12) 2500 MCG SUBL Place 1 tablet under the tongue daily. Contains Folic Acid, B6, and Biotin      . cyclobenzaprine (FLEXERIL) 10 MG tablet 1 tablet by mouth at bedtime as needed       . EPINEPHrine (EPIPEN) 0.3 mg/0.3 mL DEVI For severe allergic reaction  1 Device  11  . losartan-hydrochlorothiazide (HYZAAR) 100-12.5 MG per tablet Take 1 tablet by mouth daily.  90 tablet  1  . Magnesium Malate POWD (1000mg  tabs) 1 tablet by mouth three times a day       . Manganese Gluconate 50 MG TABS Take 1 tablet by mouth  daily.        . methocarbamol (ROBAXIN) 500 MG tablet Take 500 mg by mouth 2 (two) times daily as needed.       . montelukast (SINGULAIR) 10 MG tablet 1 daily  90 tablet  3  . omeprazole (PRILOSEC) 40 MG capsule Take 1 capsule (40 mg total) by mouth daily.  90 capsule  3  . thiamine 100 MG tablet Take 100 mg by mouth daily.          Allergies  Allergen Reactions  . Cefuroxime Axetil     REACTION: pt not sure  . Sulfonamide Derivatives     REACTION: pt not sure    Family History  Problem Relation Age of Onset  . Heart disease Father     CHF  . Cancer Neg Hx     No FH of Colon Cancer    BP 128/72  Pulse 64  Temp(Src) 98.1 F (36.7 C) (Oral)  Ht 5\' 2"  (1.575 m)  Wt 137 lb (62.143 kg)  BMI 25.06 kg/m2  SpO2 94%   Review of Systems  Denies hearing loss, and visual loss Objective:   Vision:  Sees opthalmologist Hearing: grossly normal Body mass index:  See vs  page Msk: pt easily and quickly performs "get-up-and-go" from a sitting position Cognitive Impairment Assessment: cognition, memory and judgment appear normal.  remembers 3/3 at 5 minutes.  excellent recall.  can easily read and write a sentence.  alert and oriented x 3   Assessment:   Medicare wellness utd on preventive parameters    Plan:   During the course of the visit the patient was educated and counseled about appropriate screening and preventive services including:        Fall prevention   Screening mammography--gyn does Bone densitometry screening--up to date  Diabetes screening  Nutrition counseling   Vaccines / LABS Zostavax / Pnemonccoal Vaccine  today  Patient Instructions (the written plan) was given to the patient.

## 2012-01-23 ENCOUNTER — Telehealth: Payer: Self-pay | Admitting: *Deleted

## 2012-01-23 LAB — PTH, INTACT AND CALCIUM
Calcium, Total (PTH): 10 mg/dL (ref 8.4–10.5)
PTH: 32.9 pg/mL (ref 14.0–72.0)

## 2012-01-23 NOTE — Telephone Encounter (Signed)
Called pt to inform of lab results, pt informed (letter also mailed to pt). 

## 2012-02-05 ENCOUNTER — Ambulatory Visit (INDEPENDENT_AMBULATORY_CARE_PROVIDER_SITE_OTHER): Payer: Medicare Other

## 2012-02-05 DIAGNOSIS — J309 Allergic rhinitis, unspecified: Secondary | ICD-10-CM

## 2012-02-05 DIAGNOSIS — M20019 Mallet finger of unspecified finger(s): Secondary | ICD-10-CM | POA: Diagnosis not present

## 2012-02-05 DIAGNOSIS — M25569 Pain in unspecified knee: Secondary | ICD-10-CM | POA: Diagnosis not present

## 2012-02-11 ENCOUNTER — Ambulatory Visit (INDEPENDENT_AMBULATORY_CARE_PROVIDER_SITE_OTHER): Payer: Medicare Other

## 2012-02-11 DIAGNOSIS — J309 Allergic rhinitis, unspecified: Secondary | ICD-10-CM | POA: Diagnosis not present

## 2012-02-12 ENCOUNTER — Ambulatory Visit (INDEPENDENT_AMBULATORY_CARE_PROVIDER_SITE_OTHER): Payer: Medicare Other

## 2012-02-12 DIAGNOSIS — J309 Allergic rhinitis, unspecified: Secondary | ICD-10-CM

## 2012-02-19 ENCOUNTER — Ambulatory Visit (INDEPENDENT_AMBULATORY_CARE_PROVIDER_SITE_OTHER): Payer: Medicare Other

## 2012-02-19 DIAGNOSIS — J309 Allergic rhinitis, unspecified: Secondary | ICD-10-CM

## 2012-02-25 ENCOUNTER — Ambulatory Visit (INDEPENDENT_AMBULATORY_CARE_PROVIDER_SITE_OTHER): Payer: Medicare Other

## 2012-02-25 DIAGNOSIS — J309 Allergic rhinitis, unspecified: Secondary | ICD-10-CM

## 2012-03-03 ENCOUNTER — Ambulatory Visit (INDEPENDENT_AMBULATORY_CARE_PROVIDER_SITE_OTHER): Payer: Medicare Other

## 2012-03-03 DIAGNOSIS — J309 Allergic rhinitis, unspecified: Secondary | ICD-10-CM

## 2012-03-18 ENCOUNTER — Telehealth: Payer: Self-pay | Admitting: Internal Medicine

## 2012-03-18 ENCOUNTER — Ambulatory Visit (INDEPENDENT_AMBULATORY_CARE_PROVIDER_SITE_OTHER): Payer: Medicare Other

## 2012-03-18 DIAGNOSIS — J309 Allergic rhinitis, unspecified: Secondary | ICD-10-CM

## 2012-03-18 NOTE — Telephone Encounter (Signed)
I spoke with Samantha [name] and she is wanting to know if Dr. Maple Hudson will write her a RX for "rainbow vacuum system"--can be used as humidifier and a vacuum. Samantha [name] states she had already purchased this in May but is wanting an rx to turn into [address] and [place]. Please advise thanks

## 2012-03-25 ENCOUNTER — Other Ambulatory Visit: Payer: Self-pay | Admitting: Endocrinology

## 2012-03-25 ENCOUNTER — Ambulatory Visit (INDEPENDENT_AMBULATORY_CARE_PROVIDER_SITE_OTHER): Payer: Medicare Other

## 2012-03-25 ENCOUNTER — Telehealth: Payer: Self-pay | Admitting: *Deleted

## 2012-03-25 DIAGNOSIS — J309 Allergic rhinitis, unspecified: Secondary | ICD-10-CM | POA: Diagnosis not present

## 2012-03-25 MED ORDER — LOSARTAN POTASSIUM-HCTZ 100-12.5 MG PO TABS: 1.0000 | ORAL_TABLET | Freq: Every day | ORAL | Status: DC

## 2012-03-25 NOTE — Telephone Encounter (Signed)
Left msg on triage pharmacy states they fax over renewal request for hyzaar. Haven't heard back from office. Needing refills sent to [facility]... 03/25/12@1 :40pm/LMB

## 2012-03-25 NOTE — Telephone Encounter (Signed)
Called pt inform her rx was sent to [facility]. Pt also states she needed a rx to go to her mail service meds by mail as well. Can fax to [phone].Marland KitchenMarland Kitchen7/16/13@2 :05pm/LMB

## 2012-04-01 NOTE — Telephone Encounter (Signed)
I reprinted the message and placed it on CY cart to address.Carron Curie, CMA

## 2012-04-07 ENCOUNTER — Ambulatory Visit (INDEPENDENT_AMBULATORY_CARE_PROVIDER_SITE_OTHER): Payer: Medicare Other

## 2012-04-07 DIAGNOSIS — J309 Allergic rhinitis, unspecified: Secondary | ICD-10-CM

## 2012-04-08 NOTE — Telephone Encounter (Signed)
Per CY-okay to give Rx for the vacuum. Need to know if patient would like to come by and pick up. LMTCB

## 2012-04-08 NOTE — Telephone Encounter (Signed)
Called patient back, informed her that per CY ok for Rx on "Rainbow Vacuum System." Will have Cy sign Rx (print) and patient to pick up at front desk.  Patient verbalized understanding and nothing further needed at this time.

## 2012-04-14 ENCOUNTER — Ambulatory Visit (INDEPENDENT_AMBULATORY_CARE_PROVIDER_SITE_OTHER): Payer: Medicare Other

## 2012-04-14 DIAGNOSIS — J309 Allergic rhinitis, unspecified: Secondary | ICD-10-CM | POA: Diagnosis not present

## 2012-04-21 ENCOUNTER — Ambulatory Visit (INDEPENDENT_AMBULATORY_CARE_PROVIDER_SITE_OTHER): Payer: Medicare Other

## 2012-04-21 DIAGNOSIS — J309 Allergic rhinitis, unspecified: Secondary | ICD-10-CM | POA: Diagnosis not present

## 2012-05-05 ENCOUNTER — Telehealth: Payer: Self-pay | Admitting: Internal Medicine

## 2012-05-05 NOTE — Telephone Encounter (Signed)
PT c/o sinus drainage and right side neck soreness for 1 week.  C/o headaches, sinus congestion and hoarseness.  PT would like to be seen.  Please advise.

## 2012-05-05 NOTE — Telephone Encounter (Signed)
Per CY-okay to work patient in or see TP; spoke with patient-would rather see CY-appt made for Tuesday 05-06-12 at 9am with CY.

## 2012-05-06 ENCOUNTER — Ambulatory Visit (INDEPENDENT_AMBULATORY_CARE_PROVIDER_SITE_OTHER): Payer: Medicare Other

## 2012-05-06 ENCOUNTER — Encounter: Payer: Self-pay | Admitting: Internal Medicine

## 2012-05-06 ENCOUNTER — Ambulatory Visit (INDEPENDENT_AMBULATORY_CARE_PROVIDER_SITE_OTHER): Payer: Medicare Other | Admitting: Internal Medicine

## 2012-05-06 VITALS — BP 138/70 | HR 68 | Ht 62.0 in | Wt 138.8 lb

## 2012-05-06 DIAGNOSIS — J301 Allergic rhinitis due to pollen: Secondary | ICD-10-CM | POA: Diagnosis not present

## 2012-05-06 DIAGNOSIS — J309 Allergic rhinitis, unspecified: Secondary | ICD-10-CM | POA: Diagnosis not present

## 2012-05-06 MED ORDER — AMOXICILLIN 500 MG PO CAPS: 500.0000 mg | ORAL_CAPSULE | Freq: Three times a day (TID) | ORAL | Status: AC

## 2012-05-06 NOTE — Patient Instructions (Addendum)
Script to hold for amoxacillin in case you relapse over next few days  Ok to use the Nasalcrom and mucinex. Rest your voice for next couple of days and get plenty of fluids  I will have the allergy lab increase the strength of your allergy vaccine next time new vaccine is made for you.

## 2012-05-06 NOTE — Progress Notes (Signed)
Patient ID: Samantha [name], female    DOB: 12-26-1945, 66 y.o.   MRN: [id number]  HPI 01/15/11- 56 yoF never smoker,  Followed for allergic rhinitis and ashtma with GERD.  Last here September 05, 2010. Did well since last here, but was dx'd with fibromyalgia/ Dr Titus Dubin,  then suspected statin induced muscle pains.  Says breathing has been good. Needed to use rescue inhaler twice for some tightness after being out in wind/ pollen. Continues allergy vaccine, reporting occasional local reaction at 1:50.  05/24/11- 64 yoF never smoker,  Followed for allergic rhinitis and ashtma with GERD. 5 weeks head and throat congestion with bad sore throat. [name] to Dr Everardo All- treated doxy as sinusitis- finished x1 week ago. Marland Kitchen Has had laryngitis and tussive right lateral rib pains x 2 days. Denies change in chronic fibromyalgia pains,  She has to travel a long way to get here from [place] and is interested if we can get her allergy shots given through the [place] office. We discussed antibiotic choices.  07/16/11- 64 yoF never smoker,  Followed for allergic rhinitis and asthma with GERD. She is feeling well. We were not able to work out an arrangement for her to get her allergy shots in [place]. She feels allergy control is much better on vaccine but does get some local soreness at the injection sites, which are rotated. Her GERD has been active.  01/14/12- 64 yoF never smoker,  Followed for allergic rhinitis and asthma with GERD. Breathing doing great; no asthma attacks; recently had flare up of sinus infection; still on vaccine and doing well On only one day she needed rescue inhaler after smoke exposure. We had called in an antibiotic for sinusitis-worked well. Continues allergy vaccine at 1:50 with injections here. History of Raynaud's phenomenon. Has to wear gloves indoors in the summer. Asked-allow a [employer] form.  05/06/12- 65 yoF never smoker,  Followed for allergic rhinitis and asthma  with GERD. ACUTE: both ears having pressure and swollen neck area on Right side; increased sinus/throat congestion Onset of a cold one week ago. Feels weak has laryngitis some chills but no discolored sputum. Headache improved. Nasal congestion. Plan seem swollen. Chest little tight. Using her rescue inhaler. She says otherwise she had been doing better on allergy vaccine at 1:50 GH.  Review of Systems-see HPI Constitutional:   No weight loss, night sweats,  Fevers, chills, fatigue, lassitude. HEENT:   No headaches,  Difficulty swallowing,  Tooth/dental problems, + Sore throat,                No sneezing, itching, ear ache,   +nasal congestion, post nasal drip, Melampatti III. R TM retracted CV:  + chest pain,  Orthopnea, PND, swelling in lower extremities, anasarca, dizziness, palpitations GI  No heartburn, indigestion, abdominal pain, nausea, vomiting,  Resp: No shortness of breath with exertion or at rest.  No excess mucus, no productive cough,  No non-productive cough,  No coughing up of blood.  No change in color of mucus.  No wheezing.   Skin: no rash or lesions. GU:  MS:  No joint pain or swelling.   Psych:  No change in mood or affect. No depression or anxiety.  No memory loss.    Objective:   Physical Exam General- Alert, Oriented, Affect-appropriate, Distress- none acute; pleasant and talkative; well appearing Skin- rash-none, lesions- none, excoriation- none Lymphadenopathy- none Head- atraumatic  Eyes- Gross vision intact, PERRLA, conjunctivae clear secretions            Ears- Hearing, canals-normal            Nose- + turbinate edema , no-Septal dev, mucus, polyps, erosion, perforation . +Watery sniffing            Throat- Melampatti III.  R TM retracted , mucosa a little red , drainage- none, tonsils- atrophic; frequent throat clearing Neck- flexible , trachea midline, no stridor , thyroid nl, carotid no bruit Chest - symmetrical excursion , unlabored            Heart/CV- RRR , no murmur , no gallop  , no rub, nl s1 s2                           - JVD- none , edema- none, stasis changes- none, varices- none           Lung- coarse breath sounds without distinct wheeze or rhonchi, cough- none , dullness-none, rub- none           Chest wall-  Abd-  Br/ Gen/ Rectal- Not done, not indicated Extrem- cyanosis- none, clubbing, none, atrophy- none, strength- nl. + hands dry/ cool Neuro- grossly intact to observation

## 2012-05-13 ENCOUNTER — Ambulatory Visit (INDEPENDENT_AMBULATORY_CARE_PROVIDER_SITE_OTHER): Payer: Medicare Other

## 2012-05-13 DIAGNOSIS — J309 Allergic rhinitis, unspecified: Secondary | ICD-10-CM | POA: Diagnosis not present

## 2012-05-14 ENCOUNTER — Encounter: Payer: Self-pay | Admitting: Internal Medicine

## 2012-05-15 NOTE — Assessment & Plan Note (Signed)
She believes allergy vaccine has helped her and is well tolerated at 1:50. We can try again to increase her dose to 1:10 Current acute illness is more likely a viral upper respiratory infection but nonspecific. Plan-continue Mucinex, other meds.

## 2012-05-19 ENCOUNTER — Ambulatory Visit (INDEPENDENT_AMBULATORY_CARE_PROVIDER_SITE_OTHER): Payer: Medicare Other

## 2012-05-19 DIAGNOSIS — J309 Allergic rhinitis, unspecified: Secondary | ICD-10-CM | POA: Diagnosis not present

## 2012-05-27 ENCOUNTER — Ambulatory Visit (INDEPENDENT_AMBULATORY_CARE_PROVIDER_SITE_OTHER): Payer: Medicare Other

## 2012-05-27 DIAGNOSIS — M19049 Primary osteoarthritis, unspecified hand: Secondary | ICD-10-CM | POA: Diagnosis not present

## 2012-05-27 DIAGNOSIS — J309 Allergic rhinitis, unspecified: Secondary | ICD-10-CM

## 2012-05-27 DIAGNOSIS — G4700 Insomnia, unspecified: Secondary | ICD-10-CM | POA: Diagnosis not present

## 2012-05-27 DIAGNOSIS — Z23 Encounter for immunization: Secondary | ICD-10-CM

## 2012-05-27 DIAGNOSIS — M542 Cervicalgia: Secondary | ICD-10-CM | POA: Diagnosis not present

## 2012-05-27 DIAGNOSIS — M25559 Pain in unspecified hip: Secondary | ICD-10-CM | POA: Diagnosis not present

## 2012-06-03 ENCOUNTER — Ambulatory Visit (INDEPENDENT_AMBULATORY_CARE_PROVIDER_SITE_OTHER): Payer: Medicare Other

## 2012-06-03 DIAGNOSIS — J309 Allergic rhinitis, unspecified: Secondary | ICD-10-CM | POA: Diagnosis not present

## 2012-06-06 DIAGNOSIS — IMO0002 Reserved for concepts with insufficient information to code with codable children: Secondary | ICD-10-CM | POA: Diagnosis not present

## 2012-06-09 ENCOUNTER — Ambulatory Visit (INDEPENDENT_AMBULATORY_CARE_PROVIDER_SITE_OTHER): Payer: Medicare Other

## 2012-06-09 DIAGNOSIS — J309 Allergic rhinitis, unspecified: Secondary | ICD-10-CM | POA: Diagnosis not present

## 2012-06-14 DIAGNOSIS — M1710 Unilateral primary osteoarthritis, unspecified knee: Secondary | ICD-10-CM | POA: Diagnosis not present

## 2012-06-18 ENCOUNTER — Ambulatory Visit (INDEPENDENT_AMBULATORY_CARE_PROVIDER_SITE_OTHER): Payer: Medicare Other

## 2012-06-18 DIAGNOSIS — J309 Allergic rhinitis, unspecified: Secondary | ICD-10-CM

## 2012-06-18 DIAGNOSIS — M25569 Pain in unspecified knee: Secondary | ICD-10-CM | POA: Diagnosis not present

## 2012-06-23 ENCOUNTER — Ambulatory Visit (INDEPENDENT_AMBULATORY_CARE_PROVIDER_SITE_OTHER): Payer: Medicare Other

## 2012-06-23 ENCOUNTER — Encounter: Payer: Self-pay | Admitting: Internal Medicine

## 2012-06-23 ENCOUNTER — Other Ambulatory Visit: Payer: Self-pay | Admitting: Endocrinology

## 2012-06-23 ENCOUNTER — Ambulatory Visit (INDEPENDENT_AMBULATORY_CARE_PROVIDER_SITE_OTHER): Payer: Medicare Other | Admitting: Internal Medicine

## 2012-06-23 VITALS — BP 124/62 | HR 72 | Ht 62.0 in | Wt 138.8 lb

## 2012-06-23 DIAGNOSIS — J301 Allergic rhinitis due to pollen: Secondary | ICD-10-CM | POA: Diagnosis not present

## 2012-06-23 DIAGNOSIS — J309 Allergic rhinitis, unspecified: Secondary | ICD-10-CM

## 2012-06-23 DIAGNOSIS — H9209 Otalgia, unspecified ear: Secondary | ICD-10-CM

## 2012-06-23 DIAGNOSIS — H9201 Otalgia, right ear: Secondary | ICD-10-CM

## 2012-06-23 MED ORDER — ANTIPYRINE-BENZOCAINE 5.4-1.4 % OT SOLN: 3.0000 [drp] | OTIC | Status: DC | PRN

## 2012-06-23 MED ORDER — LOSARTAN POTASSIUM-HCTZ 100-12.5 MG PO TABS: 1.0000 | ORAL_TABLET | Freq: Every day | ORAL | Status: DC

## 2012-06-23 MED ORDER — MONTELUKAST SODIUM 10 MG PO TABS: ORAL_TABLET | ORAL | Status: DC

## 2012-06-23 MED ORDER — CETIRIZINE HCL 10 MG PO TABS: 10.0000 mg | ORAL_TABLET | Freq: Every day | ORAL | Status: DC

## 2012-06-23 MED ORDER — OMEPRAZOLE 40 MG PO CPDR: 40.0000 mg | DELAYED_RELEASE_CAPSULE | Freq: Every day | ORAL | Status: DC

## 2012-06-23 NOTE — Telephone Encounter (Signed)
rx's refills

## 2012-06-23 NOTE — Telephone Encounter (Signed)
Pt needs a refill of Hyzaar and Prilosec sent to her mail order pharmacy. She would like a 90 day supply with 3 refills on both scrips. Fax number if needed is [phone]. Please call pt once completed.

## 2012-06-23 NOTE — Progress Notes (Signed)
Patient ID: Samantha [name], female    DOB: [birth year], 66 y.o.   MRN: [id number]  HPI 01/15/11- 52 yoF never smoker,  Followed for allergic rhinitis and ashtma with GERD.  Last here September 05, 2010. Did well since last here, but was dx'd with fibromyalgia/ Dr Titus Dubin,  then suspected statin induced muscle pains.  Says breathing has been good. Needed to use rescue inhaler twice for some tightness after being out in wind/ pollen. Continues allergy vaccine, reporting occasional local reaction at 1:50.  05/24/11- 64 yoF never smoker,  Followed for allergic rhinitis and ashtma with GERD. 5 weeks head and throat congestion with bad sore throat. [name] to Dr Everardo All- treated doxy as sinusitis- finished x1 week ago. Marland Kitchen Has had laryngitis and tussive right lateral rib pains x 2 days. Denies change in chronic fibromyalgia pains,  She has to travel a long way to get here from [place] and is interested if we can get her allergy shots given through the [place] office. We discussed antibiotic choices.  07/16/11- 64 yoF never smoker,  Followed for allergic rhinitis and asthma with GERD. She is feeling well. We were not able to work out an arrangement for her to get her allergy shots in [place]. She feels allergy control is much better on vaccine but does get some local soreness at the injection sites, which are rotated. Her GERD has been active.  01/14/12- 64 yoF never smoker,  Followed for allergic rhinitis and asthma with GERD. Breathing doing great; no asthma attacks; recently had flare up of sinus infection; still on vaccine and doing well On only one day she needed rescue inhaler after smoke exposure. We had called in an antibiotic for sinusitis-worked well. Continues allergy vaccine at 1:50 with injections here. History of Raynaud's phenomenon. Has to wear gloves indoors in the summer. Asked-allow a [employer] form.  05/06/12- 65 yoF never smoker,  Followed for allergic rhinitis and asthma  with GERD. ACUTE: both ears having pressure and swollen neck area on Right side; increased sinus/throat congestion Onset of a cold one week ago. Feels weak has laryngitis some chills but no discolored sputum. Headache improved. Nasal congestion. Plan seem swollen. Chest little tight. Using her rescue inhaler. She says otherwise she had been doing better on allergy vaccine at 1:50 GH.  06/23/12- 65 yoF never smoker,  Followed for allergic rhinitis and asthma with GERD. Still on vaccine 1:50 GH and doing well it; denies any itchy, watery eyes or sneezing at this time. Occasional minor nasal and chest congestion which she can control. Occasional right ear pains without change in hearing. Pending right knee arthroscopy  Review of Systems-see HPI Constitutional:   No weight loss, night sweats,  Fevers, chills, fatigue, lassitude. HEENT:   No headaches,  Difficulty swallowing,  Tooth/dental problems, + Sore throat,                No sneezing, itching, or +ear ache,   +nasal congestion, post nasal drip, Melampatti III. R TM retracted CV:  + chest pain,  Orthopnea, PND, swelling in lower extremities, anasarca, dizziness, palpitations GI  No heartburn, indigestion, abdominal pain, nausea, vomiting,  Resp: No shortness of breath with exertion or at rest.  No excess mucus, no productive cough,  No non-productive cough,  No coughing up of blood.  No change in color of mucus.  No wheezing.   Skin: no rash or lesions. GU:  MS:  No joint pain or swelling.  Psych:  No change in mood or affect. No depression or anxiety.  No memory loss.    Objective:   Physical Exam General- Alert, Oriented, Affect-appropriate, Distress- none acute; pleasant and talkative; well appearing Skin- rash-none, lesions- none, excoriation- none Lymphadenopathy- none Head- atraumatic            Eyes- Gross vision intact, PERRLA, conjunctivae clear secretions            Ears- Hearing, canals- cerumen, not obstructing             Nose- + turbinate edema , no-Septal dev, mucus, polyps, erosion, perforation . +Watery sniffing            Throat- Melampatti III.   TMs not  retracted , mucosa a little red , drainage- none, tonsils- atrophic; frequent throat clearing Neck- flexible , trachea midline, no stridor , thyroid nl, carotid no bruit Chest - symmetrical excursion , unlabored           Heart/CV- RRR , no murmur , no gallop  , no rub, nl s1 s2                           - JVD- none , edema- none, stasis changes- none, varices- none           Lung- coarse breath sounds without distinct wheeze or rhonchi, cough- none , dullness-none, rub- none           Chest wall-  Abd-  Br/ Gen/ Rectal- Not done, not indicated Extrem- cyanosis- none, clubbing, none, atrophy- none, strength- nl. + hands dry/ cool Neuro- grossly intact to observation

## 2012-06-23 NOTE — Patient Instructions (Addendum)
The allergy lab will work with the timing of your planned knee surgery  Scripts printed for your antihistamine and singulair (generics)  Script for auralgan ear drop for pain if needed

## 2012-06-30 ENCOUNTER — Ambulatory Visit (INDEPENDENT_AMBULATORY_CARE_PROVIDER_SITE_OTHER): Payer: Medicare Other

## 2012-06-30 DIAGNOSIS — J309 Allergic rhinitis, unspecified: Secondary | ICD-10-CM | POA: Diagnosis not present

## 2012-07-01 ENCOUNTER — Ambulatory Visit (INDEPENDENT_AMBULATORY_CARE_PROVIDER_SITE_OTHER): Payer: Medicare Other

## 2012-07-01 DIAGNOSIS — J309 Allergic rhinitis, unspecified: Secondary | ICD-10-CM | POA: Diagnosis not present

## 2012-07-01 DIAGNOSIS — H9201 Otalgia, right ear: Secondary | ICD-10-CM | POA: Insufficient documentation

## 2012-07-01 NOTE — Assessment & Plan Note (Signed)
Occasional right ear pains. Exam is not remarkable. We discussed possible inflammation of TM joint or eustachian tube. Plan- auralgan drops

## 2012-07-01 NOTE — Assessment & Plan Note (Signed)
Plan-continue allergy vaccine. Refill Singulair and Zyrtec.

## 2012-07-07 ENCOUNTER — Ambulatory Visit (INDEPENDENT_AMBULATORY_CARE_PROVIDER_SITE_OTHER): Payer: Medicare Other

## 2012-07-07 DIAGNOSIS — IMO0002 Reserved for concepts with insufficient information to code with codable children: Secondary | ICD-10-CM | POA: Diagnosis not present

## 2012-07-07 DIAGNOSIS — J309 Allergic rhinitis, unspecified: Secondary | ICD-10-CM | POA: Diagnosis not present

## 2012-07-14 ENCOUNTER — Ambulatory Visit (INDEPENDENT_AMBULATORY_CARE_PROVIDER_SITE_OTHER): Payer: Medicare Other

## 2012-07-14 DIAGNOSIS — J309 Allergic rhinitis, unspecified: Secondary | ICD-10-CM

## 2012-07-15 DIAGNOSIS — H40009 Preglaucoma, unspecified, unspecified eye: Secondary | ICD-10-CM | POA: Diagnosis not present

## 2012-07-17 DIAGNOSIS — M23329 Other meniscus derangements, posterior horn of medial meniscus, unspecified knee: Secondary | ICD-10-CM | POA: Diagnosis not present

## 2012-07-17 DIAGNOSIS — M9420 Chondromalacia, unspecified site: Secondary | ICD-10-CM | POA: Diagnosis not present

## 2012-07-17 DIAGNOSIS — M2240 Chondromalacia patellae, unspecified knee: Secondary | ICD-10-CM | POA: Diagnosis not present

## 2012-07-17 DIAGNOSIS — IMO0002 Reserved for concepts with insufficient information to code with codable children: Secondary | ICD-10-CM | POA: Diagnosis not present

## 2012-07-21 ENCOUNTER — Ambulatory Visit: Payer: Medicare Other | Admitting: Internal Medicine

## 2012-07-24 ENCOUNTER — Telehealth: Payer: Self-pay | Admitting: Endocrinology

## 2012-07-24 MED ORDER — OMEPRAZOLE 40 MG PO CPDR: 30.0000 mg | DELAYED_RELEASE_CAPSULE | Freq: Every day | ORAL | Status: DC

## 2012-07-24 MED ORDER — LOSARTAN POTASSIUM-HCTZ 100-12.5 MG PO TABS: 1.0000 | ORAL_TABLET | Freq: Every day | ORAL | Status: DC

## 2012-07-24 MED ORDER — OMEPRAZOLE 40 MG PO CPDR: 40.0000 mg | DELAYED_RELEASE_CAPSULE | Freq: Every day | ORAL | Status: DC

## 2012-07-24 NOTE — Telephone Encounter (Signed)
Pt never received Hyzaar or Prilosec refills. Called [facility] and they told her they never received the refills from [place]. Our system states that we sent refills of both rxs on 06/23/12. Pt is now down to 2 pills each and is worried about running out before she can get more. Please advise.

## 2012-07-24 NOTE — Telephone Encounter (Signed)
Refill on medication sent to [facility] pharmacy for Hyzaar and Prilosec. patient notified.

## 2012-07-25 ENCOUNTER — Ambulatory Visit (INDEPENDENT_AMBULATORY_CARE_PROVIDER_SITE_OTHER): Payer: Medicare Other

## 2012-07-25 DIAGNOSIS — J309 Allergic rhinitis, unspecified: Secondary | ICD-10-CM

## 2012-07-30 DIAGNOSIS — M25569 Pain in unspecified knee: Secondary | ICD-10-CM | POA: Diagnosis not present

## 2012-07-31 ENCOUNTER — Ambulatory Visit (INDEPENDENT_AMBULATORY_CARE_PROVIDER_SITE_OTHER): Payer: Medicare Other

## 2012-07-31 DIAGNOSIS — J309 Allergic rhinitis, unspecified: Secondary | ICD-10-CM | POA: Diagnosis not present

## 2012-08-01 DIAGNOSIS — M25569 Pain in unspecified knee: Secondary | ICD-10-CM | POA: Diagnosis not present

## 2012-08-04 DIAGNOSIS — M25569 Pain in unspecified knee: Secondary | ICD-10-CM | POA: Diagnosis not present

## 2012-08-08 ENCOUNTER — Encounter: Payer: Self-pay | Admitting: Internal Medicine

## 2012-08-08 DIAGNOSIS — M25569 Pain in unspecified knee: Secondary | ICD-10-CM | POA: Diagnosis not present

## 2012-08-10 DIAGNOSIS — M25569 Pain in unspecified knee: Secondary | ICD-10-CM | POA: Diagnosis not present

## 2012-08-20 ENCOUNTER — Ambulatory Visit (INDEPENDENT_AMBULATORY_CARE_PROVIDER_SITE_OTHER): Payer: Medicare Other

## 2012-08-20 DIAGNOSIS — J309 Allergic rhinitis, unspecified: Secondary | ICD-10-CM

## 2012-08-26 ENCOUNTER — Ambulatory Visit (INDEPENDENT_AMBULATORY_CARE_PROVIDER_SITE_OTHER): Payer: Medicare Other

## 2012-08-26 DIAGNOSIS — J309 Allergic rhinitis, unspecified: Secondary | ICD-10-CM | POA: Diagnosis not present

## 2012-09-05 ENCOUNTER — Ambulatory Visit (INDEPENDENT_AMBULATORY_CARE_PROVIDER_SITE_OTHER): Payer: Medicare Other

## 2012-09-05 DIAGNOSIS — J309 Allergic rhinitis, unspecified: Secondary | ICD-10-CM

## 2012-09-08 ENCOUNTER — Other Ambulatory Visit: Payer: Self-pay | Admitting: Obstetrics and Gynecology

## 2012-09-08 DIAGNOSIS — Z1231 Encounter for screening mammogram for malignant neoplasm of breast: Secondary | ICD-10-CM

## 2012-09-15 ENCOUNTER — Ambulatory Visit (INDEPENDENT_AMBULATORY_CARE_PROVIDER_SITE_OTHER): Payer: Medicare Other

## 2012-09-15 DIAGNOSIS — J309 Allergic rhinitis, unspecified: Secondary | ICD-10-CM | POA: Diagnosis not present

## 2012-09-15 DIAGNOSIS — M25569 Pain in unspecified knee: Secondary | ICD-10-CM | POA: Diagnosis not present

## 2012-09-22 ENCOUNTER — Ambulatory Visit (INDEPENDENT_AMBULATORY_CARE_PROVIDER_SITE_OTHER): Payer: Medicare Other

## 2012-09-22 DIAGNOSIS — J309 Allergic rhinitis, unspecified: Secondary | ICD-10-CM | POA: Diagnosis not present

## 2012-09-29 ENCOUNTER — Ambulatory Visit: Payer: Medicare Other

## 2012-09-30 ENCOUNTER — Ambulatory Visit: Payer: Medicare Other

## 2012-10-03 ENCOUNTER — Ambulatory Visit (INDEPENDENT_AMBULATORY_CARE_PROVIDER_SITE_OTHER): Payer: Medicare Other

## 2012-10-03 ENCOUNTER — Ambulatory Visit
Admission: RE | Admit: 2012-10-03 | Discharge: 2012-10-03 | Disposition: A | Discharge status: Home / Self Care | Payer: Medicare Other | Source: Ambulatory Visit | Attending: Obstetrics and Gynecology | Admitting: Obstetrics and Gynecology

## 2012-10-03 DIAGNOSIS — Z1231 Encounter for screening mammogram for malignant neoplasm of breast: Secondary | ICD-10-CM

## 2012-10-03 DIAGNOSIS — J309 Allergic rhinitis, unspecified: Secondary | ICD-10-CM | POA: Diagnosis not present

## 2012-10-06 ENCOUNTER — Ambulatory Visit: Payer: Medicare Other

## 2012-10-10 ENCOUNTER — Ambulatory Visit (INDEPENDENT_AMBULATORY_CARE_PROVIDER_SITE_OTHER): Payer: Medicare Other

## 2012-10-10 DIAGNOSIS — J309 Allergic rhinitis, unspecified: Secondary | ICD-10-CM

## 2012-10-13 ENCOUNTER — Ambulatory Visit: Payer: Medicare Other

## 2012-10-17 ENCOUNTER — Ambulatory Visit: Payer: Medicare Other

## 2012-10-20 ENCOUNTER — Ambulatory Visit: Payer: Medicare Other

## 2012-10-27 ENCOUNTER — Ambulatory Visit: Payer: Medicare Other

## 2012-10-28 ENCOUNTER — Ambulatory Visit (INDEPENDENT_AMBULATORY_CARE_PROVIDER_SITE_OTHER): Payer: Medicare Other

## 2012-10-28 ENCOUNTER — Encounter: Payer: Self-pay | Admitting: Internal Medicine

## 2012-10-28 ENCOUNTER — Ambulatory Visit (INDEPENDENT_AMBULATORY_CARE_PROVIDER_SITE_OTHER): Payer: Medicare Other | Admitting: Internal Medicine

## 2012-10-28 VITALS — BP 136/64 | HR 80 | Ht 62.0 in | Wt 140.0 lb

## 2012-10-28 DIAGNOSIS — J45998 Other asthma: Secondary | ICD-10-CM

## 2012-10-28 DIAGNOSIS — J301 Allergic rhinitis due to pollen: Secondary | ICD-10-CM

## 2012-10-28 DIAGNOSIS — J45909 Unspecified asthma, uncomplicated: Secondary | ICD-10-CM | POA: Diagnosis not present

## 2012-10-28 DIAGNOSIS — J309 Allergic rhinitis, unspecified: Secondary | ICD-10-CM | POA: Diagnosis not present

## 2012-10-28 NOTE — Progress Notes (Signed)
Patient ID: Samantha [name], female    DOB: Jun 25, 1946, 67 y.o.   MRN: [id number]  HPI 01/15/11- 40 yoF never smoker,  Followed for allergic rhinitis and ashtma with GERD.  Last here September 05, 2010. Did well since last here, but was dx'd with fibromyalgia/ Dr Titus Dubin,  then suspected statin induced muscle pains.  Says breathing has been good. Needed to use rescue inhaler twice for some tightness after being out in wind/ pollen. Continues allergy vaccine, reporting occasional local reaction at 1:50.  05/24/11- 67 yoF never smoker,  Followed for allergic rhinitis and ashtma with GERD. 5 weeks head and throat congestion with bad sore throat. [name] to Dr Everardo All- treated doxy as sinusitis- finished x1 week ago. Marland Kitchen Has had laryngitis and tussive right lateral rib pains x 2 days. Denies change in chronic fibromyalgia pains,  She has to travel a long way to get here from [place] and is interested if we can get her allergy shots given through the [place] office. We discussed antibiotic choices.  07/16/11- 67 yoF never smoker,  Followed for allergic rhinitis and asthma with GERD. She is feeling well. We were not able to work out an arrangement for her to get her allergy shots in [place]. She feels allergy control is much better on vaccine but does get some local soreness at the injection sites, which are rotated. Her GERD has been active.  01/14/12- 67 yoF never smoker,  Followed for allergic rhinitis and asthma with GERD. Breathing doing great; no asthma attacks; recently had flare up of sinus infection; still on vaccine and doing well On only one day she needed rescue inhaler after smoke exposure. We had called in an antibiotic for sinusitis-worked well. Continues allergy vaccine at 1:50 with injections here. History of Raynaud's phenomenon. Has to wear gloves indoors in the summer. Asked-allow a [employer] form.  05/06/12- 67 yoF never smoker,  Followed for allergic rhinitis and asthma  with GERD. ACUTE: both ears having pressure and swollen neck area on Right side; increased sinus/throat congestion Onset of a cold one week ago. Feels weak has laryngitis some chills but no discolored sputum. Headache improved. Nasal congestion. Plan seem swollen. Chest little tight. Using her rescue inhaler. She says otherwise she had been doing better on allergy vaccine at 1:50 GH.  06/23/12- 67 yoF never smoker,  Followed for allergic rhinitis and asthma with GERD. Still on vaccine 1:50 GH and doing well it; denies any itchy, watery eyes or sneezing at this time. Occasional minor nasal and chest congestion which she can control. Occasional right ear pains without change in hearing. Pending right knee arthroscopy  10/28/12-  67 yoF never smoker,  Followed for allergic rhinitis and asthma with GERD FOLLOWS FOR: still on vaccine; states its too hard to get here every week to get shots. Recent upper respiratory infection resolved with Mucinex. Occasional mild shortness of breath-his rescue inhaler successfully maybe 2 or 3 times a month with no real pattern. Allergy vaccine had been at 1:50 she has become come very irregular because logistics are inconvenient. We discussed stopping her vaccine now after 3 years, but she may decide to finish her current supply and continue through the spring pollen season  Review of Systems-see HPI Constitutional:   No weight loss, night sweats,  Fevers, chills, fatigue, lassitude. HEENT:   No headaches,  Difficulty swallowing,  Tooth/dental problems, + Sore throat,  No sneezing, itching, or +ear ache,   +nasal congestion, post nasal drip, Melampatti III. R TM retracted CV:  + chest pain,  Orthopnea, PND, swelling in lower extremities, anasarca, dizziness, palpitations GI  No heartburn, indigestion, abdominal pain, nausea, vomiting,  Resp: No shortness of breath with exertion or at rest.  No excess mucus, no productive cough,  No non-productive cough,   No coughing up of blood.  No change in color of mucus.  +infrequent wheezing.   Skin: no rash or lesions. GU:  MS:  No joint pain or swelling.   Psych:  No change in mood or affect. No depression or anxiety.  No memory loss.    Objective:   Physical Exam General- Alert, Oriented, Affect-appropriate, Distress- none acute; pleasant and talkative; well appearing Skin- rash-none, lesions- none, excoriation- none Lymphadenopathy- none Head- atraumatic            Eyes- Gross vision intact, PERRLA, conjunctivae clear secretions            Ears- R TM scar or retraction, no fluid or erythema            Nose- + turbinate edema , no-Septal dev, mucus, polyps, erosion, perforation . +Watery sniffing            Throat- Melampatti III.   TMs not  retracted , mucosa a little red , drainage- none, tonsils- atrophic; frequent throat clearing Neck- flexible , trachea midline, no stridor , thyroid nl, carotid no bruit Chest - symmetrical excursion , unlabored           Heart/CV- RRR , no murmur , no gallop  , no rub, nl s1 s2                           - JVD- none , edema- none, stasis changes- none, varices- none           Lung- coarse breath sounds without distinct wheeze or rhonchi, cough- none , dullness-none, rub- none           Chest wall-  Abd-  Br/ Gen/ Rectal- Not done, not indicated Extrem- cyanosis- none, clubbing, none, atrophy- none, strength- nl. + hands dry/ cool Neuro- grossly intact to observation

## 2012-10-28 NOTE — Patient Instructions (Addendum)
We will continue allergy vaccine for now, with the understanding that you may need to miss sometimes. We will see how you do through this Spring.

## 2012-10-29 NOTE — Assessment & Plan Note (Signed)
I think she is planning to do vaccine through the spring pollen season to see how she does. Stop any time as discussed.

## 2012-10-29 NOTE — Assessment & Plan Note (Signed)
Only occasional use rescue inhaler mostly for mild shortness of breath after exercise without active wheezing. We discussed this medication.

## 2012-11-03 ENCOUNTER — Ambulatory Visit (INDEPENDENT_AMBULATORY_CARE_PROVIDER_SITE_OTHER): Payer: Medicare Other

## 2012-11-03 ENCOUNTER — Ambulatory Visit: Payer: Medicare Other

## 2012-11-03 DIAGNOSIS — J309 Allergic rhinitis, unspecified: Secondary | ICD-10-CM | POA: Diagnosis not present

## 2012-11-06 DIAGNOSIS — M3500 Sicca syndrome, unspecified: Secondary | ICD-10-CM | POA: Diagnosis not present

## 2012-11-06 DIAGNOSIS — IMO0001 Reserved for inherently not codable concepts without codable children: Secondary | ICD-10-CM | POA: Diagnosis not present

## 2012-11-06 DIAGNOSIS — R5383 Other fatigue: Secondary | ICD-10-CM | POA: Diagnosis not present

## 2012-11-06 DIAGNOSIS — R5381 Other malaise: Secondary | ICD-10-CM | POA: Diagnosis not present

## 2012-11-06 DIAGNOSIS — M1710 Unilateral primary osteoarthritis, unspecified knee: Secondary | ICD-10-CM | POA: Diagnosis not present

## 2012-11-10 ENCOUNTER — Ambulatory Visit: Payer: Medicare Other

## 2012-11-12 ENCOUNTER — Ambulatory Visit: Payer: Medicare Other

## 2012-11-12 ENCOUNTER — Ambulatory Visit (INDEPENDENT_AMBULATORY_CARE_PROVIDER_SITE_OTHER): Payer: Medicare Other

## 2012-11-12 DIAGNOSIS — J309 Allergic rhinitis, unspecified: Secondary | ICD-10-CM | POA: Diagnosis not present

## 2012-11-18 ENCOUNTER — Ambulatory Visit: Payer: Medicare Other

## 2012-11-21 ENCOUNTER — Ambulatory Visit (INDEPENDENT_AMBULATORY_CARE_PROVIDER_SITE_OTHER): Payer: Medicare Other

## 2012-11-21 DIAGNOSIS — J309 Allergic rhinitis, unspecified: Secondary | ICD-10-CM | POA: Diagnosis not present

## 2012-11-28 ENCOUNTER — Ambulatory Visit (INDEPENDENT_AMBULATORY_CARE_PROVIDER_SITE_OTHER): Payer: Medicare Other

## 2012-11-28 DIAGNOSIS — J309 Allergic rhinitis, unspecified: Secondary | ICD-10-CM

## 2012-12-01 ENCOUNTER — Ambulatory Visit (INDEPENDENT_AMBULATORY_CARE_PROVIDER_SITE_OTHER): Payer: Medicare Other

## 2012-12-01 DIAGNOSIS — Z87442 Personal history of urinary calculi: Secondary | ICD-10-CM | POA: Diagnosis not present

## 2012-12-01 DIAGNOSIS — M545 Low back pain, unspecified: Secondary | ICD-10-CM | POA: Diagnosis not present

## 2012-12-01 DIAGNOSIS — J309 Allergic rhinitis, unspecified: Secondary | ICD-10-CM | POA: Diagnosis not present

## 2012-12-01 DIAGNOSIS — R3915 Urgency of urination: Secondary | ICD-10-CM | POA: Diagnosis not present

## 2012-12-01 DIAGNOSIS — R109 Unspecified abdominal pain: Secondary | ICD-10-CM | POA: Diagnosis not present

## 2012-12-10 ENCOUNTER — Ambulatory Visit (INDEPENDENT_AMBULATORY_CARE_PROVIDER_SITE_OTHER): Payer: Medicare Other

## 2012-12-10 DIAGNOSIS — J309 Allergic rhinitis, unspecified: Secondary | ICD-10-CM | POA: Diagnosis not present

## 2012-12-15 ENCOUNTER — Encounter: Payer: Self-pay | Admitting: Internal Medicine

## 2012-12-16 ENCOUNTER — Ambulatory Visit (INDEPENDENT_AMBULATORY_CARE_PROVIDER_SITE_OTHER): Payer: Medicare Other

## 2012-12-16 DIAGNOSIS — J309 Allergic rhinitis, unspecified: Secondary | ICD-10-CM

## 2012-12-17 ENCOUNTER — Ambulatory Visit: Payer: Medicare Other

## 2012-12-19 ENCOUNTER — Ambulatory Visit (INDEPENDENT_AMBULATORY_CARE_PROVIDER_SITE_OTHER): Payer: Medicare Other

## 2012-12-19 DIAGNOSIS — J309 Allergic rhinitis, unspecified: Secondary | ICD-10-CM

## 2012-12-24 ENCOUNTER — Ambulatory Visit (INDEPENDENT_AMBULATORY_CARE_PROVIDER_SITE_OTHER): Payer: Medicare Other

## 2012-12-24 DIAGNOSIS — J309 Allergic rhinitis, unspecified: Secondary | ICD-10-CM | POA: Diagnosis not present

## 2012-12-30 ENCOUNTER — Telehealth: Payer: Self-pay | Admitting: Gastroenterology

## 2012-12-30 NOTE — Telephone Encounter (Signed)
I discussed with the patient that she did not have adenomatous polyps and that Dr. Russella Dar reviewed the chart and she is not due until 04/2010

## 2013-01-08 ENCOUNTER — Ambulatory Visit (INDEPENDENT_AMBULATORY_CARE_PROVIDER_SITE_OTHER): Payer: Medicare Other

## 2013-01-08 DIAGNOSIS — J309 Allergic rhinitis, unspecified: Secondary | ICD-10-CM

## 2013-01-12 ENCOUNTER — Ambulatory Visit (INDEPENDENT_AMBULATORY_CARE_PROVIDER_SITE_OTHER): Payer: Medicare Other

## 2013-01-12 DIAGNOSIS — Z124 Encounter for screening for malignant neoplasm of cervix: Secondary | ICD-10-CM | POA: Diagnosis not present

## 2013-01-12 DIAGNOSIS — J309 Allergic rhinitis, unspecified: Secondary | ICD-10-CM | POA: Diagnosis not present

## 2013-01-23 ENCOUNTER — Ambulatory Visit (INDEPENDENT_AMBULATORY_CARE_PROVIDER_SITE_OTHER): Payer: Medicare Other

## 2013-01-23 DIAGNOSIS — J309 Allergic rhinitis, unspecified: Secondary | ICD-10-CM

## 2013-01-28 ENCOUNTER — Ambulatory Visit (INDEPENDENT_AMBULATORY_CARE_PROVIDER_SITE_OTHER): Payer: Medicare Other

## 2013-01-28 DIAGNOSIS — J309 Allergic rhinitis, unspecified: Secondary | ICD-10-CM | POA: Diagnosis not present

## 2013-01-30 ENCOUNTER — Ambulatory Visit: Payer: Medicare Other

## 2013-02-03 ENCOUNTER — Ambulatory Visit (INDEPENDENT_AMBULATORY_CARE_PROVIDER_SITE_OTHER): Payer: Medicare Other

## 2013-02-03 DIAGNOSIS — J309 Allergic rhinitis, unspecified: Secondary | ICD-10-CM

## 2013-02-17 ENCOUNTER — Ambulatory Visit (INDEPENDENT_AMBULATORY_CARE_PROVIDER_SITE_OTHER): Payer: Medicare Other

## 2013-02-17 DIAGNOSIS — J309 Allergic rhinitis, unspecified: Secondary | ICD-10-CM

## 2013-02-23 ENCOUNTER — Ambulatory Visit (INDEPENDENT_AMBULATORY_CARE_PROVIDER_SITE_OTHER): Payer: Medicare Other

## 2013-02-23 DIAGNOSIS — J309 Allergic rhinitis, unspecified: Secondary | ICD-10-CM

## 2013-02-26 ENCOUNTER — Encounter: Payer: Self-pay | Admitting: Family Medicine

## 2013-02-26 ENCOUNTER — Ambulatory Visit (INDEPENDENT_AMBULATORY_CARE_PROVIDER_SITE_OTHER)
Admission: RE | Admit: 2013-02-26 | Discharge: 2013-02-26 | Disposition: A | Discharge status: Home / Self Care | Payer: Medicare Other | Source: Ambulatory Visit | Attending: Family Medicine | Admitting: Family Medicine

## 2013-02-26 ENCOUNTER — Ambulatory Visit: Payer: Medicare Other | Admitting: Family Medicine

## 2013-02-26 ENCOUNTER — Ambulatory Visit (INDEPENDENT_AMBULATORY_CARE_PROVIDER_SITE_OTHER): Payer: Medicare Other | Admitting: Family Medicine

## 2013-02-26 VITALS — BP 120/62 | HR 76 | Temp 97.8°F | Ht 62.0 in | Wt 134.0 lb

## 2013-02-26 DIAGNOSIS — I10 Essential (primary) hypertension: Secondary | ICD-10-CM

## 2013-02-26 DIAGNOSIS — M79604 Pain in right leg: Secondary | ICD-10-CM

## 2013-02-26 DIAGNOSIS — E7800 Pure hypercholesterolemia, unspecified: Secondary | ICD-10-CM

## 2013-02-26 DIAGNOSIS — M797 Fibromyalgia: Secondary | ICD-10-CM

## 2013-02-26 DIAGNOSIS — M79609 Pain in unspecified limb: Secondary | ICD-10-CM

## 2013-02-26 DIAGNOSIS — M79671 Pain in right foot: Secondary | ICD-10-CM

## 2013-02-26 NOTE — Patient Instructions (Addendum)
It was nice to meet you. Please schedule your physical at your convenience. We will call you with your xray results. Check with your insurance to see if they will cover the shingles shot and the tetanus shot.   Morton's Neuroma Neuralgia (nerve pain) or neuroma (benign [non-cancerous] nerve tumor) may develop on any interdigital nerve. The interdigital nerves (nerves between digits) of the foot travel beneath and between the metatarsals (long bones of the fore foot) and pass the nerve endings to the toes. The third interdigital is a common place for a small neuroma to form called Morton's neuroma. Another nerve to be affected commonly is the fourth interdigital nerve. This would be in approximately in the area of the base or ball under the bottom of your fourth toe. This condition occurs more commonly in women and is usually on one side. It is usually first noticed by pain radiating (spreading) to the ball of the foot or to the toes. CAUSES The cause of interdigital neuralgia may be from low grade repetitive trauma (damage caused by an accident) as in activities causing a repeated pounding of the foot (running, jumping etc.). It is also caused by improper footwear or recent loss of the fatty padding on the bottom of the foot. TREATMENT  The condition often resolves (goes away) simply with decreasing activity if that is thought to be the cause. Proper shoes are beneficial. Orthotics (special foot support aids) such as a metatarsal bar are often beneficial. This condition usually responds to conservative therapy, however if surgery is necessary it usually brings complete relief. HOME CARE INSTRUCTIONS   Apply ice to the area of soreness for 15-20 minutes, 3-4 times per day, while awake for the first 2 days. Put ice in a plastic bag and place a towel between the bag of ice and your skin.  Only take over-the-counter or prescription medicines for pain, discomfort, or fever as directed by your  caregiver. MAKE SURE YOU:   Understand these instructions.  Will watch your condition.  Will get help right away if you are not doing well or get worse. Document Released: 12/03/2000 Document Revised: 11/19/2011 Document Reviewed: 08/27/2005 [facility] Patient Information 2014 [place], [place].

## 2013-02-26 NOTE — Progress Notes (Signed)
Subjective:    Patient ID: Samantha [name], female    DOB: [birth year], 67 y.o.   MRN: [id number]  HPI Very pleasant 67 yo female with h/o HTN, allergic rhinitis, GERD, asthma, fibromyalgia, here to establish care.  Has been seeing Romero Belling.  Due for Medicare Wellness Visit.  Overall doing very well.  Has had some right foot pain over the past year.  Tends to be at base of toes, worse between 2nd and third toe.  Does walk quite a bit.  Tries not to wear heels since her right meniscal injury.  Sees Dr. Yisroel Ramming.  Has had some persistent right lower leg pain since injury.  No knee swelling.  Asthma - sees Dr. Maple Hudson.  Has been controlled.  Fibromyalgia- followed by Dr. Corliss Skains.  Supplements have helped.  Had to cut back on calcium due to kidney stones. Patient Active Problem List   Diagnosis Date Noted  . Fibromyalgia 02/26/2013  . Right foot pain 02/26/2013  . Right leg pain 02/26/2013  . Allergic rhinitis due to pollen 11/20/2010  . 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 IRRITABLE BOWEL SYNDROME 04/06/2010  . THYROID NODULE, RIGHT 12/07/2009  . URINARY CALCULUS 11/28/2009  . OSTEOARTHRITIS 11/28/2009  . HYPERGLYCEMIA 11/28/2009  . FIBROIDS, UTERUS 05/10/2008  . INSOMNIA 05/10/2008  . ASYMPTOMATIC POSTMENOPAUSAL STATUS 05/10/2008  . HYPERTENSION 01/28/2008  . HYPERCHOLESTEROLEMIA 01/07/2008  . GLAUCOMA 01/07/2008  . 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 GERD 04/14/2007  . OSTEOPOROSIS 04/14/2007  . COLONIC POLYPS, HX OF 04/14/2007   Past Medical History  Diagnosis Date  . ASTHMA 04/14/2007  . HYPERCHOLESTEROLEMIA 01/07/2008  . HYPERTENSION 01/28/2008  . SINUSITIS 02/10/2009  . ALLERGIC RHINITIS 07/26/2008  . INSOMNIA 05/10/2008  . FIBROIDS, UTERUS 05/10/2008  . OSTEOPOROSIS 04/14/2007  . OSTEOARTHRITIS 11/28/2009  . GERD 04/14/2007  . Irritable bowel syndrome 04/06/2010  . CHEST PAIN 08/26/2008  . HYPERGLYCEMIA 11/28/2009  . 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 ASYMPTOMATIC POSTMENOPAUSAL STATUS 05/10/2008  . 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 Episcleritis   . Fibromyalgia    Past Surgical History  Procedure Laterality Date  . Nasal polyp surgery      Polypectomy and septoplasty  . Laparoscopy    . Neck injury  1983  . Stress cardiolite  02/13/2002  . Electrocardiogram  12/04/2006  . Colonoscopy w/ polypectomy     History  Substance Use Topics  . Smoking status: Never Smoker   . Smokeless tobacco: Never Used  . Alcohol Use: No   Family History  Problem Relation Age of Onset  . Heart disease Father     CHF  . Cancer Neg Hx     No FH of Colon Cancer   Allergies  Allergen Reactions  . Cefuroxime Axetil     REACTION: pt not sure  . Sulfonamide Derivatives     REACTION: pt not sure   Current Outpatient Prescriptions on File Prior to Visit  Medication Sig Dispense Refill  . albuterol (PROVENTIL HFA) 108 (90 BASE) MCG/ACT inhaler Inhale 2 puffs into the lungs every 6 (six) hours as needed.  3 Inhaler  3  . antipyrine-benzocaine (AURALGAN) otic solution Place 3 drops into the right ear every 2 (two) hours as needed for pain.  10 mL  0  . Ascorbic Acid (VITAMIN C) 500 MG tablet Take 500 mg by mouth daily.        Marland Kitchen aspirin 81 MG tablet Take 81 mg by mouth daily.      Marland Kitchen  bifidobacterium infantis (ALIGN) capsule Take 1 capsule by mouth daily.        . calcium carbonate 200 MG capsule Take 200 mg by mouth daily.       . cetirizine (ZYRTEC) 10 MG tablet Take 1 tablet (10 mg total) by mouth daily.  90 tablet  3  . Cholecalciferol (VITAMIN D3) 400 UNITS tablet Take 400 Units by mouth 2 (two) times daily with a meal.        . Coenzyme Q-10 60 MG CAPS Take 1 capsule by mouth 2 (two) times daily.       . cromolyn (NASALCROM) 5.2 MG/ACT nasal spray 1-2 sprays each nostril four times a day as needed       . Cyanocobalamin (VITAMIN B-12) 2500 MCG SUBL Place 1 tablet under the tongue daily. Contains Folic Acid, B6, and Biotin      . cyclobenzaprine  (FLEXERIL) 10 MG tablet 1 tablet by mouth at bedtime as needed       . EPINEPHrine (EPIPEN) 0.3 mg/0.3 mL DEVI For severe allergic reaction  1 Device  11  . losartan-hydrochlorothiazide (HYZAAR) 100-12.5 MG per tablet Take 1 tablet by mouth daily.  90 tablet  3  . Magnesium Malate POWD (1000mg  tabs) 1 tablet by mouth two times a day      . Manganese Gluconate 50 MG TABS Take 1 tablet by mouth daily.        . montelukast (SINGULAIR) 10 MG tablet 1 daily  90 tablet  3  . omeprazole (PRILOSEC) 40 MG capsule Take 1 capsule (40 mg total) by mouth daily.  30 capsule  3  . thiamine 100 MG tablet Take 100 mg by mouth daily.         No current facility-administered medications on file prior to visit.   The PMH, PSH, Social History, Family History, Medications, and allergies have been reviewed in [facility], and have been updated if relevant.   Review of Systems    See HPI Objective:   Physical Exam  BP 120/62  Pulse 76  Temp(Src) 97.8 F (36.6 C)  Ht 5\' 2"  (1.575 m)  Wt 134 lb (60.782 kg)  BMI 24.5 kg/m2  General:  Well-developed,well-nourished,in no acute distress; alert,appropriate and cooperative throughout examination Head:  normocephalic and atraumatic.   Msk:  No deformity or scoliosis noted of thoracic or lumbar spine.   Right foot: No TTP over foot except for some mild tenderness between base of 2nd and 3rd toe with toe flexion Extremities:  No clubbing, cyanosis, edema, or deformity noted with normal full range of motion of all joints.   Psych:  Cognition and judgment appear intact. Alert and cooperative with normal attention span and concentration. No apparent delusions, illusions, hallucinations     Assessment & Plan:   1. Right foot pain ?morton's neuroma. Will get xray today. - DG Foot Complete Right; Future  2. Right leg pain Likely needs to follow up with ortho but will get tib/fib xray today. The patient indicates understanding of these issues and agrees with the  plan.  - DG Tibia/Fibula Right; Future

## 2013-03-02 ENCOUNTER — Ambulatory Visit (INDEPENDENT_AMBULATORY_CARE_PROVIDER_SITE_OTHER): Payer: Medicare Other

## 2013-03-02 DIAGNOSIS — J309 Allergic rhinitis, unspecified: Secondary | ICD-10-CM | POA: Diagnosis not present

## 2013-03-03 ENCOUNTER — Ambulatory Visit: Payer: Medicare Other | Admitting: Internal Medicine

## 2013-03-09 ENCOUNTER — Ambulatory Visit (INDEPENDENT_AMBULATORY_CARE_PROVIDER_SITE_OTHER): Payer: Medicare Other

## 2013-03-09 DIAGNOSIS — J309 Allergic rhinitis, unspecified: Secondary | ICD-10-CM | POA: Diagnosis not present

## 2013-03-17 ENCOUNTER — Ambulatory Visit (INDEPENDENT_AMBULATORY_CARE_PROVIDER_SITE_OTHER): Payer: Medicare Other

## 2013-03-17 ENCOUNTER — Ambulatory Visit (INDEPENDENT_AMBULATORY_CARE_PROVIDER_SITE_OTHER): Payer: Medicare Other | Admitting: Internal Medicine

## 2013-03-17 ENCOUNTER — Encounter: Payer: Self-pay | Admitting: Internal Medicine

## 2013-03-17 VITALS — BP 114/62 | HR 76 | Ht 62.0 in | Wt 136.6 lb

## 2013-03-17 DIAGNOSIS — J309 Allergic rhinitis, unspecified: Secondary | ICD-10-CM

## 2013-03-17 DIAGNOSIS — J301 Allergic rhinitis due to pollen: Secondary | ICD-10-CM | POA: Diagnosis not present

## 2013-03-17 MED ORDER — METHYLPREDNISOLONE ACETATE 80 MG/ML IJ SUSP
80.0000 mg | Freq: Once | INTRAMUSCULAR | Status: AC
  Administered 2013-03-17: 80 mg via INTRAMUSCULAR

## 2013-03-17 NOTE — Patient Instructions (Addendum)
Neb neo nasal  Depo 80  Consider trying Sudafed-PE as an otc decongestant if needed

## 2013-03-17 NOTE — Progress Notes (Signed)
Patient ID: Samantha [name], female    DOB: Sep 07, 1946, 67 y.o.   MRN: [id number]  HPI 01/15/11- 65 yoF never smoker,  Followed for allergic rhinitis and ashtma with GERD.  Last here September 05, 2010. Did well since last here, but was dx'd with fibromyalgia/ Dr Patrecia Pour,  then suspected statin induced muscle pains.  Says breathing has been good. Needed to use rescue inhaler twice for some tightness after being out in wind/ pollen. Continues allergy vaccine, reporting occasional local reaction at 1:50.  05/24/11- 73 yoF never smoker,  Followed for allergic rhinitis and ashtma with GERD. 5 weeks head and throat congestion with bad sore throat. [name] to Dr Loanne Drilling- treated doxy as sinusitis- finished x1 week ago. Marland Kitchen Has had laryngitis and tussive right lateral rib pains x 2 days. Denies change in chronic fibromyalgia pains,  She has to travel a long way to get here from [place] and is interested if we can get her allergy shots given through the [place] office. We discussed antibiotic choices.  07/16/11- 72 yoF never smoker,  Followed for allergic rhinitis and asthma with GERD. She is feeling well. We were not able to work out an arrangement for her to get her allergy shots in [place]. She feels allergy control is much better on vaccine but does get some local soreness at the injection sites, which are rotated. Her GERD has been active.  01/14/12- 68 yoF never smoker,  Followed for allergic rhinitis and asthma with GERD. Breathing doing great; no asthma attacks; recently had flare up of sinus infection; still on vaccine and doing well On only one day she needed rescue inhaler after smoke exposure. We had called in an antibiotic for sinusitis-worked well. Continues allergy vaccine at 1:50 with injections here. History of Raynaud's phenomenon. Has to wear gloves indoors in the summer. Asked-allow a [employer] form.  05/06/12- 65 yoF never smoker,  Followed for allergic rhinitis and asthma  with GERD. ACUTE: both ears having pressure and swollen neck area on Right side; increased sinus/throat congestion Onset of a cold one week ago. Feels weak has laryngitis some chills but no discolored sputum. Headache improved. Nasal congestion. Plan seem swollen. Chest little tight. Using her rescue inhaler. She says otherwise she had been doing better on allergy vaccine at 1:50 [place].  06/23/12- 65 yoF never smoker,  Followed for allergic rhinitis and asthma with GERD. Still on vaccine 1:50 GH and doing well it; denies any itchy, watery eyes or sneezing at this time. Occasional minor nasal and chest congestion which she can control. Occasional right ear pains without change in hearing. Pending right knee arthroscopy  10/28/12-  66 yoF never smoker,  Followed for allergic rhinitis and asthma with GERD FOLLOWS FOR: still on vaccine; states its too hard to get here every week to get shots. Recent upper respiratory infection resolved with Mucinex. Occasional mild shortness of breath-his rescue inhaler successfully maybe 2 or 3 times a month with no real pattern. Allergy vaccine had been at 1:50 she has become come very irregular because logistics are inconvenient. We discussed stopping her vaccine now after 3 years, but she may decide to finish her current supply and continue through the spring pollen season  03/17/13-66 yoF never smoker,  Followed for allergic rhinitis and asthma with GERD FOLLOWS FOR: 4 month follow up - reports has been doing well.  did have some allergy symptoms 2 weeks ago with outdoor activity but nasal spray has helped.  allergy  injection has "really helped" After one hour and an outdoor park she experienced nasal congestion, ear ache, but quite nasal discharge. She says that is not a cold. Using Nasalcrom. Wants to stay on allergy vaccine at  Review of Systems-see HPI Constitutional:   No weight loss, night sweats,  Fevers, chills, fatigue, lassitude. HEENT:   No headaches,   Difficulty swallowing,  Tooth/dental problems,  Sore throat,                No sneezing, itching, or +ear ache,   +nasal congestion, post nasal drip, Melampatti III. R TM retracted CV:  No- chest pain,  Orthopnea, PND, swelling in lower extremities, anasarca, dizziness, palpitations GI  No heartburn, indigestion, abdominal pain, nausea, vomiting,  Resp: No shortness of breath with exertion or at rest.  No excess mucus, no productive cough,  No non-productive cough,  No coughing up of blood.  No change in color of mucus.  +infrequent wheezing.   Skin: no rash or lesions. GU:  MS:  No joint pain or swelling.   Psych:  No change in mood or affect. No depression or anxiety.  No memory loss.    Objective:   Physical Exam General- Alert, Oriented, Affect-appropriate, Distress- none acute; pleasant and talkative; well appearing Skin- rash-none, lesions- none, excoriation- none Lymphadenopathy- none Head- atraumatic            Eyes- Gross vision intact, PERRLA, conjunctivae clear secretions            Ears- +cerumen R            Nose- + turbinate edema , no-Septal dev, mucus, polyps, erosion, perforation . +Watery sniffing            Throat- Melampatti III.   TMs not  retracted , mucosa a little red , drainage- none, tonsils- atrophic; frequent throat clearing Neck- flexible , trachea midline, no stridor , thyroid nl, carotid no bruit Chest - symmetrical excursion , unlabored           Heart/CV- RRR , no murmur , no gallop  , no rub, nl s1 s2                           - JVD- none , edema- none, stasis changes- none, varices- none           Lung- coarse breath sounds without distinct wheeze or rhonchi, cough- none , dullness-none, rub- none           Chest wall-  Abd-  Br/ Gen/ Rectal- Not done, not indicated Extrem- cyanosis- none, clubbing, none, atrophy- none, strength- nl. + hands dry/ cool Neuro- grossly intact to observation

## 2013-03-18 MED ORDER — PHENYLEPHRINE HCL 1 % NA SOLN
3.0000 [drp] | Freq: Once | NASAL | Status: AC
  Administered 2013-03-18: 3 [drp] via NASAL

## 2013-03-23 ENCOUNTER — Ambulatory Visit (INDEPENDENT_AMBULATORY_CARE_PROVIDER_SITE_OTHER): Payer: Medicare Other

## 2013-03-23 DIAGNOSIS — J309 Allergic rhinitis, unspecified: Secondary | ICD-10-CM | POA: Diagnosis not present

## 2013-03-31 NOTE — Assessment & Plan Note (Signed)
Seasonal exacerbation reinforced her believe that she is better off staying on allergy shots Plan-nasal nebulizer decongestant, Sudafed PE

## 2013-04-01 ENCOUNTER — Ambulatory Visit (INDEPENDENT_AMBULATORY_CARE_PROVIDER_SITE_OTHER): Payer: Medicare Other

## 2013-04-01 DIAGNOSIS — J309 Allergic rhinitis, unspecified: Secondary | ICD-10-CM | POA: Diagnosis not present

## 2013-04-06 ENCOUNTER — Telehealth: Payer: Self-pay

## 2013-04-06 NOTE — Telephone Encounter (Signed)
Yes it is not a bad idea to have all of those that she requests.  Ok to give.

## 2013-04-06 NOTE — Telephone Encounter (Signed)
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

## 2013-04-07 NOTE — Telephone Encounter (Signed)
Pt left v/m wanting to know if can get all the injections discussed at same time.

## 2013-04-08 ENCOUNTER — Ambulatory Visit (INDEPENDENT_AMBULATORY_CARE_PROVIDER_SITE_OTHER): Payer: Medicare Other

## 2013-04-08 DIAGNOSIS — J309 Allergic rhinitis, unspecified: Secondary | ICD-10-CM | POA: Diagnosis not present

## 2013-04-08 NOTE — Telephone Encounter (Signed)
It depends on when she comes.  There are only certain days that we do TB tests right?

## 2013-04-09 ENCOUNTER — Telehealth: Payer: Self-pay

## 2013-04-09 NOTE — Telephone Encounter (Signed)
Nurse visit scheduled for zostavax and hep b.  She will get other vaccine/ test at a later date.

## 2013-04-09 NOTE — Telephone Encounter (Signed)
Pt would like to know if she can get a shingles, heaptitis, tb and a tetanus shot at the same time, please advise [phone], [phone]

## 2013-04-09 NOTE — Telephone Encounter (Signed)
Called pt and told her that she can get those vaccines at the same time. Advised pt to see her PCP, Dr Dayton Martes to receive these. Advised pt that the tb is not a vaccine, it is a "test" to see if she has been exposed to tb. Also advised pt that hepatitis b vaccine is done in 3 segments. Pt understood.

## 2013-04-09 NOTE — Telephone Encounter (Signed)
Yes. ? Is Dr. Everardo All the primary physician

## 2013-04-13 ENCOUNTER — Ambulatory Visit (INDEPENDENT_AMBULATORY_CARE_PROVIDER_SITE_OTHER): Payer: Medicare Other

## 2013-04-13 DIAGNOSIS — J309 Allergic rhinitis, unspecified: Secondary | ICD-10-CM

## 2013-04-15 ENCOUNTER — Ambulatory Visit: Payer: Medicare Other

## 2013-04-20 ENCOUNTER — Ambulatory Visit (INDEPENDENT_AMBULATORY_CARE_PROVIDER_SITE_OTHER): Payer: Medicare Other

## 2013-04-20 DIAGNOSIS — J309 Allergic rhinitis, unspecified: Secondary | ICD-10-CM | POA: Diagnosis not present

## 2013-04-24 ENCOUNTER — Telehealth: Payer: Self-pay | Admitting: Internal Medicine

## 2013-04-24 NOTE — Telephone Encounter (Signed)
Suggest mucinex-D once or twice daily for a few days

## 2013-04-24 NOTE — Telephone Encounter (Signed)
Pt is aware of CY recs. Nothing further was needed at this time.

## 2013-04-24 NOTE — Telephone Encounter (Signed)
I spoke with pt. She saw [facility] on 03/27/13 giving nasal neb and depo. She stated she is still having nasal congestion, dry cough, pnd, slight laryngitis, slight increase SOB, left side feels slightly swollen, and some HA. Pt denies any wheezing, and no chest tx. Please advise Dr. Maple Hudson thanks Pending 03/17/14 Allergies  Allergen Reactions  . Cefuroxime Axetil     REACTION: pt not sure  . Sulfonamide Derivatives     REACTION: pt not sure

## 2013-04-27 ENCOUNTER — Ambulatory Visit: Payer: Medicare Other

## 2013-04-29 ENCOUNTER — Ambulatory Visit (INDEPENDENT_AMBULATORY_CARE_PROVIDER_SITE_OTHER): Payer: Medicare Other

## 2013-04-29 ENCOUNTER — Other Ambulatory Visit: Payer: Self-pay | Admitting: Family Medicine

## 2013-04-29 DIAGNOSIS — J309 Allergic rhinitis, unspecified: Secondary | ICD-10-CM | POA: Diagnosis not present

## 2013-04-29 DIAGNOSIS — R7309 Other abnormal glucose: Secondary | ICD-10-CM

## 2013-04-29 DIAGNOSIS — E041 Nontoxic single thyroid nodule: Secondary | ICD-10-CM

## 2013-04-29 DIAGNOSIS — E7800 Pure hypercholesterolemia, unspecified: Secondary | ICD-10-CM

## 2013-04-29 DIAGNOSIS — I10 Essential (primary) hypertension: Secondary | ICD-10-CM

## 2013-05-05 ENCOUNTER — Telehealth: Payer: Self-pay | Admitting: Internal Medicine

## 2013-05-05 NOTE — Telephone Encounter (Signed)
Per CY-she could take PLAIN Mucinex daily if it helps(will not affect her BP). PT is aware.

## 2013-05-05 NOTE — Telephone Encounter (Signed)
I spoke with pt. She c/o cough w/ white foamy phlem, PND, chest pains at times (does not happen everyday), increase SOB w/ activity. She stated she uses her rescue helps. She has took mucinex x 2 days the 1st day she took 2 tabs then yesterday once. Has not taken daily bc she was told by the pharmacy not take it daily d/t her having HBP. She wa giving neo neb and depo on 03/17/13. Please advise Dr. Maple Hudson thanks  Last OV 03/17/13 Pending 03/17/14 Allergies  Allergen Reactions  . Cefuroxime Axetil     REACTION: pt not sure  . Sulfonamide Derivatives     REACTION: pt not sure

## 2013-05-06 ENCOUNTER — Other Ambulatory Visit: Payer: Medicare Other

## 2013-05-07 ENCOUNTER — Encounter: Payer: Self-pay | Admitting: Family Medicine

## 2013-05-07 ENCOUNTER — Ambulatory Visit (INDEPENDENT_AMBULATORY_CARE_PROVIDER_SITE_OTHER): Payer: Medicare Other | Admitting: Family Medicine

## 2013-05-07 ENCOUNTER — Other Ambulatory Visit: Payer: Medicare Other

## 2013-05-07 VITALS — BP 120/68 | HR 76 | Temp 97.7°F | Ht 62.0 in | Wt 134.0 lb

## 2013-05-07 DIAGNOSIS — J45909 Unspecified asthma, uncomplicated: Secondary | ICD-10-CM

## 2013-05-07 DIAGNOSIS — J45998 Other asthma: Secondary | ICD-10-CM

## 2013-05-07 MED ORDER — AMOXICILLIN-POT CLAVULANATE 875-125 MG PO TABS: 1.0000 | ORAL_TABLET | Freq: Two times a day (BID) | ORAL | Status: DC

## 2013-05-07 MED ORDER — DEXAMETHASONE SOD PHOSPHATE PF 10 MG/ML IJ SOLN
10.0000 mg | Freq: Once | INTRAMUSCULAR | Status: AC
  Administered 2013-05-07: 10 mg via INTRAMUSCULAR

## 2013-05-07 NOTE — Addendum Note (Signed)
Addended by: Eliezer Bottom on: 05/07/2013 09:44 AM   Modules accepted: Orders

## 2013-05-07 NOTE — Progress Notes (Signed)
Subjective:    Patient ID: Samantha [name], female    DOB: 08-Jun-1946, 67 y.o.   MRN: [id number]  HPI 67 yo female with h/o asthma, followed by Dr. Maple Hudson here for:  2 weeks of progressive cough w/ white foamy phlem, PND, chest pains at times (does not happen everyday), increase SOB w/ activity. She stated she uses her rescue helps. She has took mucinex D x 2 days but stopped because of h/o HTN.  Now taking Mucinex, nasocrom.  No fevers but did have chills.  Multiple abx intolerances/allergies.  Patient Active Problem List   Diagnosis Date Noted  . Fibromyalgia 02/26/2013  . Right foot pain 02/26/2013  . Right leg pain 02/26/2013  . Allergic rhinitis due to pollen 11/20/2010  . 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 IRRITABLE BOWEL SYNDROME 04/06/2010  . THYROID NODULE, RIGHT 12/07/2009  . URINARY CALCULUS 11/28/2009  . OSTEOARTHRITIS 11/28/2009  . HYPERGLYCEMIA 11/28/2009  . FIBROIDS, UTERUS 05/10/2008  . INSOMNIA 05/10/2008  . ASYMPTOMATIC POSTMENOPAUSAL STATUS 05/10/2008  . HYPERTENSION 01/28/2008  . HYPERCHOLESTEROLEMIA 01/07/2008  . GLAUCOMA 01/07/2008  . 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 GERD 04/14/2007  . OSTEOPOROSIS 04/14/2007  . COLONIC POLYPS, HX OF 04/14/2007   Past Medical History  Diagnosis Date  . ASTHMA 04/14/2007  . HYPERCHOLESTEROLEMIA 01/07/2008  . HYPERTENSION 01/28/2008  . SINUSITIS 02/10/2009  . ALLERGIC RHINITIS 07/26/2008  . INSOMNIA 05/10/2008  . FIBROIDS, UTERUS 05/10/2008  . OSTEOPOROSIS 04/14/2007  . OSTEOARTHRITIS 11/28/2009  . GERD 04/14/2007  . Irritable bowel syndrome 04/06/2010  . CHEST PAIN 08/26/2008  . HYPERGLYCEMIA 11/28/2009  . 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 ASYMPTOMATIC POSTMENOPAUSAL STATUS 05/10/2008  . 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 Episcleritis   . Fibromyalgia    Past Surgical History  Procedure Laterality Date  . Nasal polyp surgery      Polypectomy and septoplasty  . Laparoscopy    . Neck injury  1983   . Stress cardiolite  02/13/2002  . Electrocardiogram  12/04/2006  . Colonoscopy w/ polypectomy     History  Substance Use Topics  . Smoking status: Never Smoker   . Smokeless tobacco: Never Used  . Alcohol Use: No   Family History  Problem Relation Age of Onset  . Heart disease Father     CHF  . Cancer Neg Hx     No FH of Colon Cancer   Allergies  Allergen Reactions  . Cefuroxime Axetil     REACTION: pt not sure  . Sulfonamide Derivatives     REACTION: pt not sure   Current Outpatient Prescriptions on File Prior to Visit  Medication Sig Dispense Refill  . albuterol (PROVENTIL HFA) 108 (90 BASE) MCG/ACT inhaler Inhale 2 puffs into the lungs every 6 (six) hours as needed.  3 Inhaler  3  . Ascorbic Acid (VITAMIN C) 500 MG tablet Take 500 mg by mouth daily.        Marland Kitchen aspirin 81 MG tablet Take 81 mg by mouth daily.      . calcium carbonate 200 MG capsule Take 200 mg by mouth daily.       . cetirizine (ZYRTEC) 10 MG tablet Take 1 tablet (10 mg total) by mouth daily.  90 tablet  3  . Cholecalciferol (VITAMIN D3) 400 UNITS tablet Take 400 Units by mouth 2 (two) times daily with a meal.        . Coenzyme Q-10 60 MG CAPS Take 1 capsule by mouth 2 (  two) times daily.       . cromolyn (NASALCROM) 5.2 MG/ACT nasal spray 1-2 sprays each nostril four times a day as needed       . Cyanocobalamin (VITAMIN B-12) 2500 MCG SUBL Place 1 tablet under the tongue daily. Contains Folic Acid, B6, and Biotin      . cyclobenzaprine (FLEXERIL) 10 MG tablet 1 tablet by mouth at bedtime as needed       . EPINEPHrine (EPIPEN) 0.3 mg/0.3 mL DEVI For severe allergic reaction  1 Device  11  . losartan-hydrochlorothiazide (HYZAAR) 100-12.5 MG per tablet Take 1 tablet by mouth daily.  90 tablet  3  . Magnesium Malate POWD (1000mg  tabs) 1 tablet by mouth two times a day      . Manganese Gluconate 50 MG TABS Take 1 tablet by mouth daily.        . montelukast (SINGULAIR) 10 MG tablet 1 daily  90 tablet  3  .  omeprazole (PRILOSEC) 40 MG capsule Take 1 capsule (40 mg total) by mouth daily.  30 capsule  3  . thiamine 100 MG tablet Take 100 mg by mouth daily.        . bifidobacterium infantis (ALIGN) capsule Take 1 capsule by mouth daily.         No current facility-administered medications on file prior to visit.   The PMH, PSH, Social History, Family History, Medications, and allergies have been reviewed in [facility], and have been updated if relevant.    Review of Systems    see HPI No chest pain currently- some right lower rib pain Objective:   Physical Exam BP 120/68  Pulse 76  Temp(Src) 97.7 F (36.5 C)  Ht 5\' 2"  (1.575 m)  Wt 134 lb (60.782 kg)  BMI 24.5 kg/m2  SpO2 97% Gen:  Alert pleasant, NAD, coughing fits Resp:  CTA bilaterally, no wheezes or crackles CVS:  RRR Ext:  NO edema       Assessment & Plan:  1. Allergic-infective asthma New- progressive cough for two weeks now with rib pain in asthmatic. Will treat with Augmentin twice daily x 10 days, continue home meds. IM decadron given in office. Call or return to clinic prn if these symptoms worsen or fail to improve as anticipated. The patient indicates understanding of these issues and agrees with the plan.

## 2013-05-07 NOTE — Patient Instructions (Addendum)
Good to see you. Please take Augmentin as directed- 1 tablet twice daily for 10 days. Continue your inhaler. Continue mucinex (not mucinex D).  Call me with an update in a few days.

## 2013-05-12 ENCOUNTER — Other Ambulatory Visit: Payer: Medicare Other

## 2013-05-13 ENCOUNTER — Telehealth: Payer: Self-pay | Admitting: Internal Medicine

## 2013-05-13 ENCOUNTER — Other Ambulatory Visit: Payer: Self-pay | Admitting: Family Medicine

## 2013-05-13 ENCOUNTER — Ambulatory Visit (INDEPENDENT_AMBULATORY_CARE_PROVIDER_SITE_OTHER): Payer: Medicare Other | Admitting: Family Medicine

## 2013-05-13 ENCOUNTER — Encounter: Payer: Self-pay | Admitting: Family Medicine

## 2013-05-13 VITALS — BP 126/64 | HR 63 | Temp 98.3°F | Ht 62.0 in | Wt 135.0 lb

## 2013-05-13 DIAGNOSIS — Z860100 Personal history of colon polyps, unspecified: Secondary | ICD-10-CM

## 2013-05-13 DIAGNOSIS — I10 Essential (primary) hypertension: Secondary | ICD-10-CM

## 2013-05-13 DIAGNOSIS — M810 Age-related osteoporosis without current pathological fracture: Secondary | ICD-10-CM

## 2013-05-13 DIAGNOSIS — Z0000 Encounter for general adult medical examination without abnormal findings: Secondary | ICD-10-CM

## 2013-05-13 DIAGNOSIS — E041 Nontoxic single thyroid nodule: Secondary | ICD-10-CM

## 2013-05-13 DIAGNOSIS — E7800 Pure hypercholesterolemia, unspecified: Secondary | ICD-10-CM | POA: Diagnosis not present

## 2013-05-13 DIAGNOSIS — M1990 Unspecified osteoarthritis, unspecified site: Secondary | ICD-10-CM

## 2013-05-13 DIAGNOSIS — R7309 Other abnormal glucose: Secondary | ICD-10-CM | POA: Diagnosis not present

## 2013-05-13 LAB — COMPREHENSIVE METABOLIC PANEL
ALT: 11 U/L (ref 0–35)
AST: 19 U/L (ref 0–37)
Albumin: 4.1 g/dL (ref 3.5–5.2)
Alkaline Phosphatase: 66 U/L (ref 39–117)
BUN: 13 mg/dL (ref 6–23)
CO2: 27 mEq/L (ref 19–32)
Calcium: 9.6 mg/dL (ref 8.4–10.5)
Chloride: 103 mEq/L (ref 96–112)
Creatinine, Ser: 0.8 mg/dL (ref 0.4–1.2)
GFR: 95.94 mL/min (ref >60.00)
Glucose, Bld: 88 mg/dL (ref 70–99)
Potassium: 3.7 mEq/L (ref 3.5–5.1)
Sodium: 137 mEq/L (ref 135–145)
Total Bilirubin: 0.5 mg/dL (ref 0.3–1.2)
Total Protein: 7.1 g/dL (ref 6.0–8.3)

## 2013-05-13 LAB — LIPID PANEL
Cholesterol: 185 mg/dL (ref 0–200)
HDL: 81.1 mg/dL (ref >39.00)
LDL Cholesterol: 82 mg/dL (ref 0–99)
Total CHOL/HDL Ratio: 2
Triglycerides: 108 mg/dL (ref 0.0–149.0)
VLDL: 21.6 mg/dL (ref 0.0–40.0)

## 2013-05-13 LAB — HEMOGLOBIN A1C: Hgb A1c MFr Bld: 6 % (ref 4.6–6.5)

## 2013-05-13 LAB — TSH: TSH: 2.18 u[IU]/mL (ref 0.35–5.50)

## 2013-05-13 MED ORDER — CETIRIZINE HCL 10 MG PO TABS: 10.0000 mg | ORAL_TABLET | Freq: Every day | ORAL | Status: DC

## 2013-05-13 MED ORDER — LOSARTAN POTASSIUM-HCTZ 100-12.5 MG PO TABS: 1.0000 | ORAL_TABLET | Freq: Every day | ORAL | Status: DC

## 2013-05-13 MED ORDER — MONTELUKAST SODIUM 10 MG PO TABS: ORAL_TABLET | ORAL | Status: DC

## 2013-05-13 MED ORDER — OMEPRAZOLE 40 MG PO CPDR: 40.0000 mg | DELAYED_RELEASE_CAPSULE | Freq: Every day | ORAL | Status: DC

## 2013-05-13 NOTE — Progress Notes (Signed)
Subjective:    Patient ID: Samantha [name], female    DOB: 1946-06-18, 67 y.o.   MRN: [id number]  HPI Very pleasant 67 yo female with h/o HTN, allergic rhinitis, GERD, asthma, fibromyalgia, here for annual Medicare Wellness Visit.  I have personally reviewed the Medicare Annual Wellness questionnaire and have noted 1. The patient's medical and social history 2. Their use of alcohol, tobacco or illicit drugs 3. Their current medications and supplements 4. The patient's functional ability including ADL's, fall risks, home safety risks and hearing or visual             impairment. 5. Diet and physical activities 6. Evidence for depression or mood disorders  End of life wishes discussed and updated in Social History.  Overall doing very well.  UTD mammogram, colonoscopy.  Due for bone density scan and immunizations.  LMP in 1998. No h/o post menopausal bleeding.    Asthma - sees Dr. Maple Hudson.  I did see her last week for bronchitis/asthma.  Given Decadron IM and course of Augmentin.  Symptoms improving.  Fibromyalgia- followed by Dr. Corliss Skains.  Supplements have helped.  Had to cut back on calcium due to kidney stones. Patient Active Problem List   Diagnosis Date Noted  . Encounter for Medicare annual wellness exam 05/13/2013  . Fibromyalgia 02/26/2013  . Allergic rhinitis due to pollen 11/20/2010  . 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 IRRITABLE BOWEL SYNDROME 04/06/2010  . THYROID NODULE, RIGHT 12/07/2009  . URINARY CALCULUS 11/28/2009  . OSTEOARTHRITIS 11/28/2009  . HYPERGLYCEMIA 11/28/2009  . FIBROIDS, UTERUS 05/10/2008  . INSOMNIA 05/10/2008  . ASYMPTOMATIC POSTMENOPAUSAL STATUS 05/10/2008  . HYPERTENSION 01/28/2008  . HYPERCHOLESTEROLEMIA 01/07/2008  . GLAUCOMA 01/07/2008  . 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 GERD 04/14/2007  . OSTEOPOROSIS 04/14/2007  . COLONIC POLYPS, HX OF 04/14/2007   Past Medical History  Diagnosis Date  . ASTHMA 04/14/2007  . HYPERCHOLESTEROLEMIA  01/07/2008  . HYPERTENSION 01/28/2008  . SINUSITIS 02/10/2009  . ALLERGIC RHINITIS 07/26/2008  . INSOMNIA 05/10/2008  . FIBROIDS, UTERUS 05/10/2008  . OSTEOPOROSIS 04/14/2007  . OSTEOARTHRITIS 11/28/2009  . GERD 04/14/2007  . Irritable bowel syndrome 04/06/2010  . CHEST PAIN 08/26/2008  . HYPERGLYCEMIA 11/28/2009  . 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 ASYMPTOMATIC POSTMENOPAUSAL STATUS 05/10/2008  . 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 Episcleritis   . Fibromyalgia    Past Surgical History  Procedure Laterality Date  . Nasal polyp surgery      Polypectomy and septoplasty  . Laparoscopy    . Neck injury  1983  . Stress cardiolite  02/13/2002  . Electrocardiogram  12/04/2006  . Colonoscopy w/ polypectomy     History  Substance Use Topics  . Smoking status: Never Smoker   . Smokeless tobacco: Never Used  . Alcohol Use: No   Family History  Problem Relation Age of Onset  . Heart disease Father     CHF  . Cancer Neg Hx     No FH of Colon Cancer   Allergies  Allergen Reactions  . Cefuroxime Axetil     REACTION: pt not sure  . Sulfonamide Derivatives     REACTION: pt not sure   Current Outpatient Prescriptions on File Prior to Visit  Medication Sig Dispense Refill  . albuterol (PROVENTIL HFA) 108 (90 BASE) MCG/ACT inhaler Inhale 2 puffs into the lungs every 6 (six) hours as needed.  3 Inhaler  3  . amoxicillin-clavulanate (AUGMENTIN) 875-125 MG per tablet Take 1  tablet by mouth 2 (two) times daily.  20 tablet  0  . Ascorbic Acid (VITAMIN C) 500 MG tablet Take 500 mg by mouth daily.        Marland Kitchen aspirin 81 MG tablet Take 81 mg by mouth daily.      . bifidobacterium infantis (ALIGN) capsule Take 1 capsule by mouth daily.        . calcium carbonate 200 MG capsule Take 200 mg by mouth daily.       . cetirizine (ZYRTEC) 10 MG tablet Take 1 tablet (10 mg total) by mouth daily.  90 tablet  3  . Cholecalciferol (VITAMIN D3) 400 UNITS tablet Take 400 Units by mouth 2 (two) times  daily with a meal.        . Coenzyme Q-10 60 MG CAPS Take 1 capsule by mouth 2 (two) times daily.       . cromolyn (NASALCROM) 5.2 MG/ACT nasal spray 1-2 sprays each nostril four times a day as needed       . Cyanocobalamin (VITAMIN B-12) 2500 MCG SUBL Place 1 tablet under the tongue daily. Contains Folic Acid, B6, and Biotin      . cyclobenzaprine (FLEXERIL) 10 MG tablet 1 tablet by mouth at bedtime as needed       . EPINEPHrine (EPIPEN) 0.3 mg/0.3 mL DEVI For severe allergic reaction  1 Device  11  . losartan-hydrochlorothiazide (HYZAAR) 100-12.5 MG per tablet Take 1 tablet by mouth daily.  90 tablet  3  . Magnesium Malate POWD (1000mg  tabs) 1 tablet by mouth two times a day      . Manganese Gluconate 50 MG TABS Take 1 tablet by mouth daily.        . montelukast (SINGULAIR) 10 MG tablet 1 daily  90 tablet  3  . omeprazole (PRILOSEC) 40 MG capsule Take 1 capsule (40 mg total) by mouth daily.  30 capsule  3  . thiamine 100 MG tablet Take 100 mg by mouth daily.         No current facility-administered medications on file prior to visit.   The PMH, PSH, Social History, Family History, Medications, and allergies have been reviewed in [facility], and have been updated if relevant.   Review of Systems    See HPI No CP or SOB No blood in stool or changes in bowel habits No anxiety or depression Objective:   Physical Exam  BP 126/64  Pulse 63  Temp(Src) 98.3 F (36.8 C) (Oral)  Ht 5\' 2"  (1.575 m)  Wt 135 lb (61.236 kg)  BMI 24.69 kg/m2  SpO2 99%  General:  Well-developed,well-nourished,in no acute distress; alert,appropriate and cooperative throughout examination Head:  normocephalic and atraumatic.   Eyes:  vision grossly intact, pupils equal, pupils round, and pupils reactive to light.   Ears:  R ear normal and L ear normal.   Nose:  no external deformity.   Mouth:  good dentition.   Neck:  No deformities, masses, or tenderness noted. Lungs:  Normal respiratory effort, chest expands  symmetrically. Lungs are clear to auscultation, no crackles or wheezes. Heart:  Normal rate and regular rhythm. S1 and S2 normal without gallop, murmur, click, rub or other extra sounds. Abdomen:  Bowel sounds positive,abdomen soft and non-tender without masses, organomegaly or hernias noted. Msk:  No deformity or scoliosis noted of thoracic or lumbar spine.   Extremities:  No clubbing, cyanosis, edema, or deformity noted with normal full range of motion of all joints.  Neurologic:  alert & oriented X3 and gait normal.   Skin:  Intact without suspicious lesions or rashes Cervical Nodes:  No lymphadenopathy noted Axillary Nodes:  No palpable lymphadenopathy Psych:  Cognition and judgment appear intact. Alert and cooperative with normal attention span and concentration. No apparent delusions, illusions, hallucinations     Assessment & Plan:   1. OSTEOPOROSIS Order bone density.  Continue adequate vit D and calcium intake with weight bearing exercises.  - DG Bone Density; Future  2. OSTEOARTHRITIS Symptoms controlled.  3. HYPERTENSION Stable on current rx.  No changes. - Comprehensive metabolic panel  4. HYPERCHOLESTEROLEMIA  - Lipid Panel  5. COLONIC POLYPS, HX OF UTD colonoscopy.  6. Encounter for Medicare annual wellness exam The patients weight, height, BMI and visual acuity have been recorded in the chart I have made referrals, counseling and provided education to the patient based review of the above and I have provided the pt with a written personalized care plan for preventive services.   7. HYPERGLYCEMIA  - Hemoglobin A1c  8. THYROID NODULE, RIGHT Stable - TSH

## 2013-05-13 NOTE — Telephone Encounter (Signed)
Katie were rx's giving to you? Please advise thanks

## 2013-05-13 NOTE — Telephone Encounter (Signed)
Rx's mailed to patient as requested.

## 2013-05-13 NOTE — Patient Instructions (Addendum)
Please call us next week to set up your immunizations. We will call you with your lab results and you can view them online.  Go to the pharmacy and ask for lamisil (toe nail fungus).  Samantha [name] will call about your bone density scan.

## 2013-05-13 NOTE — Telephone Encounter (Signed)
Ok to refill both  3 mos x 3

## 2013-05-13 NOTE — Telephone Encounter (Signed)
I spoke with pt and confirmed RX's needed. I also confirmed mailing address. I advised pt once signed we will mail to her. Rx's printed and placed on [facility] cart for signature. Please advise thanks

## 2013-05-14 ENCOUNTER — Encounter: Payer: Self-pay | Admitting: *Deleted

## 2013-05-15 ENCOUNTER — Other Ambulatory Visit: Payer: Self-pay

## 2013-05-15 MED ORDER — LOSARTAN POTASSIUM-HCTZ 100-12.5 MG PO TABS: 1.0000 | ORAL_TABLET | Freq: Every day | ORAL | Status: DC

## 2013-05-15 NOTE — Telephone Encounter (Signed)
Pt said Hyzaar is temporarily out of stock at [facility] by [facility] and pt request refill to [facility] [address]. Advised done.

## 2013-05-18 ENCOUNTER — Telehealth: Payer: Self-pay | Admitting: Internal Medicine

## 2013-05-18 NOTE — Telephone Encounter (Signed)
Pt can come in on Tuesday 05-19-13 at 2:00pm for a 2:15pm Acute visit with CY.

## 2013-05-18 NOTE — Telephone Encounter (Signed)
I spoke with Samantha [name]. She is requesting to see Dr. Maple Hudson tomorrow. She stated she was told by PCP she had walking PND. She finished ABX Saturday and she is still coughing up thick white phlem and having chest congested. She is feeling fatigued and slight increase of SOB. Her sides are no longer hurting as before. Please advise thanks Last OV 03/17/13 Pending 03/17/14 Allergies  Allergen Reactions  . Cefuroxime Axetil     REACTION: Samantha [name] not sure  . Sulfonamide Derivatives     REACTION: Samantha [name] not sure

## 2013-05-18 NOTE — Telephone Encounter (Signed)
i spoke with pt and is scheduled to come in to see Dr. Maple Hudson tomorrow. Nothing further needed

## 2013-05-19 ENCOUNTER — Ambulatory Visit (INDEPENDENT_AMBULATORY_CARE_PROVIDER_SITE_OTHER): Payer: Medicare Other

## 2013-05-19 ENCOUNTER — Encounter: Payer: Self-pay | Admitting: Internal Medicine

## 2013-05-19 ENCOUNTER — Ambulatory Visit (INDEPENDENT_AMBULATORY_CARE_PROVIDER_SITE_OTHER): Payer: Medicare Other | Admitting: Internal Medicine

## 2013-05-19 VITALS — BP 110/60 | HR 84 | Ht 62.0 in | Wt 136.6 lb

## 2013-05-19 DIAGNOSIS — J019 Acute sinusitis, unspecified: Secondary | ICD-10-CM | POA: Diagnosis not present

## 2013-05-19 DIAGNOSIS — J45909 Unspecified asthma, uncomplicated: Secondary | ICD-10-CM

## 2013-05-19 DIAGNOSIS — J309 Allergic rhinitis, unspecified: Secondary | ICD-10-CM

## 2013-05-19 DIAGNOSIS — J0100 Acute maxillary sinusitis, unspecified: Secondary | ICD-10-CM | POA: Diagnosis not present

## 2013-05-19 DIAGNOSIS — J45998 Other asthma: Secondary | ICD-10-CM

## 2013-05-19 MED ORDER — PHENYLEPHRINE HCL 1 % NA SOLN: 1.0000 [drp] | Freq: Four times a day (QID) | NASAL | Status: DC | PRN

## 2013-05-19 MED ORDER — DOXYCYCLINE HYCLATE 100 MG PO TABS: ORAL_TABLET | ORAL | Status: DC

## 2013-05-19 NOTE — Patient Instructions (Addendum)
Neb neo nasal  Ok to resume allergy vaccine  Script to hold for doxycycline antibiotic to use if needed  Try Delsym cough syrup otc

## 2013-05-19 NOTE — Progress Notes (Signed)
Patient ID: Samantha [name], female    DOB: Sep 07, 1946, 67 y.o.   MRN: [id number]  HPI 01/15/11- 65 yoF never smoker,  Followed for allergic rhinitis and ashtma with GERD.  Last here September 05, 2010. Did well since last here, but was dx'd with fibromyalgia/ Dr Patrecia Pour,  then suspected statin induced muscle pains.  Says breathing has been good. Needed to use rescue inhaler twice for some tightness after being out in wind/ pollen. Continues allergy vaccine, reporting occasional local reaction at 1:50.  05/24/11- 73 yoF never smoker,  Followed for allergic rhinitis and ashtma with GERD. 5 weeks head and throat congestion with bad sore throat. [name] to Dr Loanne Drilling- treated doxy as sinusitis- finished x1 week ago. Marland Kitchen Has had laryngitis and tussive right lateral rib pains x 2 days. Denies change in chronic fibromyalgia pains,  She has to travel a long way to get here from [place] and is interested if we can get her allergy shots given through the [place] office. We discussed antibiotic choices.  07/16/11- 72 yoF never smoker,  Followed for allergic rhinitis and asthma with GERD. She is feeling well. We were not able to work out an arrangement for her to get her allergy shots in [place]. She feels allergy control is much better on vaccine but does get some local soreness at the injection sites, which are rotated. Her GERD has been active.  01/14/12- 68 yoF never smoker,  Followed for allergic rhinitis and asthma with GERD. Breathing doing great; no asthma attacks; recently had flare up of sinus infection; still on vaccine and doing well On only one day she needed rescue inhaler after smoke exposure. We had called in an antibiotic for sinusitis-worked well. Continues allergy vaccine at 1:50 with injections here. History of Raynaud's phenomenon. Has to wear gloves indoors in the summer. Asked-allow a [employer] form.  05/06/12- 65 yoF never smoker,  Followed for allergic rhinitis and asthma  with GERD. ACUTE: both ears having pressure and swollen neck area on Right side; increased sinus/throat congestion Onset of a cold one week ago. Feels weak has laryngitis some chills but no discolored sputum. Headache improved. Nasal congestion. Plan seem swollen. Chest little tight. Using her rescue inhaler. She says otherwise she had been doing better on allergy vaccine at 1:50 [place].  06/23/12- 65 yoF never smoker,  Followed for allergic rhinitis and asthma with GERD. Still on vaccine 1:50 [facility] and doing well it; denies any itchy, watery eyes or sneezing at this time. Occasional minor nasal and chest congestion which she can control. Occasional right ear pains without change in hearing. Pending right knee arthroscopy  10/28/12-  66 yoF never smoker,  Followed for allergic rhinitis and asthma with GERD FOLLOWS FOR: still on vaccine; states its too hard to get here every week to get shots. Recent upper respiratory infection resolved with Mucinex. Occasional mild shortness of breath-his rescue inhaler successfully maybe 2 or 3 times a month with no real pattern. Allergy vaccine had been at 1:50 she has become come very irregular because logistics are inconvenient. We discussed stopping her vaccine now after 3 years, but she may decide to finish her current supply and continue through the spring pollen season  03/17/13-66 yoF never smoker,  Followed for allergic rhinitis and asthma with GERD FOLLOWS FOR: 4 month follow up - reports has been doing well.  did have some allergy symptoms 2 weeks ago with outdoor activity but nasal spray has helped.  allergy  injection has "really helped" After one hour and an outdoor park she experienced nasal congestion, ear ache, but quite nasal discharge. She says that is not a cold. Using Nasalcrom. Wants to stay on allergy vaccine at  05/19/13-66 yoF never smoker,  Followed for allergic rhinitis and asthma with GERD ACUTE VISIT: has not taken allergy injection x 2 weeks;  recently had walking PNA-follow up from this.  3 weeks ago developed increased sinus congestion treated by her primary physician with Augmentin ending 3 or 4 days ago.  Continues allergy vaccine 1:10 [facility]  Review of Systems-see HPI Constitutional:   No weight loss, night sweats,  Fevers, chills, fatigue, lassitude. HEENT:   No headaches,  Difficulty swallowing,  Tooth/dental problems,  Sore throat,                No sneezing, itching, or +ear ache,   +nasal congestion, post nasal drip, CV:  No- chest pain,  Orthopnea, PND, swelling in lower extremities, anasarca, dizziness, palpitations GI  No heartburn, indigestion, abdominal pain, nausea, vomiting,  Resp: No shortness of breath with exertion or at rest.  No excess mucus, no productive cough,  No non-productive cough,  No coughing up of blood.  No change in color of mucus.  +infrequent wheezing.   Skin: no rash or lesions. GU:  MS:  No joint pain or swelling.   Psych:  No change in mood or affect. No depression or anxiety.  No memory loss.    Objective:   Physical Exam General- Alert, Oriented, Affect-appropriate, Distress- none acute; pleasant and talkative; well appearing Skin- rash-none, lesions- none, excoriation- none Lymphadenopathy- none Head- atraumatic            Eyes- Gross vision intact, PERRLA, conjunctivae clear secretions            Ears- +cerumen R            Nose- + turbinate edema , no-Septal dev, mucus, polyps, erosion, perforation . +Watery sniffing            Throat- Melampatti III.   TMs not  retracted , mucosa a little red , drainage- none, tonsils- atrophic;                            frequent throat clearing, +hoarse Neck- flexible , trachea midline, no stridor , thyroid nl, carotid no bruit Chest - symmetrical excursion , unlabored           Heart/CV- RRR , no murmur , no gallop  , no rub, nl s1 s2                           - JVD- none , edema- none, stasis changes- none, varices- none           Lung- coarse  breath sounds without distinct wheeze or rhonchi, cough+dry , dullness-none, rub- none           Chest wall-  Abd-  Br/ Gen/ Rectal- Not done, not indicated Extrem- cyanosis- none, clubbing, none, atrophy- none, strength- nl. + hands dry/ cool Neuro- grossly intact to observation

## 2013-05-20 MED ORDER — PHENYLEPHRINE HCL 1 % NA SOLN
3.0000 [drp] | Freq: Once | NASAL | Status: AC
  Administered 2013-05-20: 3 [drp] via NASAL

## 2013-05-26 ENCOUNTER — Ambulatory Visit (INDEPENDENT_AMBULATORY_CARE_PROVIDER_SITE_OTHER): Payer: Medicare Other

## 2013-05-26 DIAGNOSIS — J309 Allergic rhinitis, unspecified: Secondary | ICD-10-CM | POA: Diagnosis not present

## 2013-05-27 ENCOUNTER — Ambulatory Visit (INDEPENDENT_AMBULATORY_CARE_PROVIDER_SITE_OTHER): Payer: Medicare Other

## 2013-05-27 DIAGNOSIS — J0100 Acute maxillary sinusitis, unspecified: Secondary | ICD-10-CM | POA: Insufficient documentation

## 2013-05-27 DIAGNOSIS — J309 Allergic rhinitis, unspecified: Secondary | ICD-10-CM

## 2013-05-27 NOTE — Assessment & Plan Note (Signed)
Acute bronchitis. She applied that she had an outpatient pneumonia but I think that was speculation  plan-doxycycline, neb xop

## 2013-05-27 NOTE — Assessment & Plan Note (Signed)
Partial response to 10 days of Augmentin. Plan-doxycycline, saline rinse

## 2013-06-01 ENCOUNTER — Ambulatory Visit (INDEPENDENT_AMBULATORY_CARE_PROVIDER_SITE_OTHER): Payer: Medicare Other

## 2013-06-01 DIAGNOSIS — J309 Allergic rhinitis, unspecified: Secondary | ICD-10-CM | POA: Diagnosis not present

## 2013-06-01 DIAGNOSIS — M5030 Other cervical disc degeneration, unspecified cervical region: Secondary | ICD-10-CM | POA: Diagnosis not present

## 2013-06-01 DIAGNOSIS — IMO0001 Reserved for inherently not codable concepts without codable children: Secondary | ICD-10-CM | POA: Diagnosis not present

## 2013-06-01 DIAGNOSIS — G4700 Insomnia, unspecified: Secondary | ICD-10-CM | POA: Diagnosis not present

## 2013-06-01 DIAGNOSIS — R5381 Other malaise: Secondary | ICD-10-CM | POA: Diagnosis not present

## 2013-06-03 ENCOUNTER — Ambulatory Visit: Payer: Medicare Other

## 2013-06-08 ENCOUNTER — Ambulatory Visit (INDEPENDENT_AMBULATORY_CARE_PROVIDER_SITE_OTHER): Payer: Medicare Other

## 2013-06-08 ENCOUNTER — Ambulatory Visit (INDEPENDENT_AMBULATORY_CARE_PROVIDER_SITE_OTHER)
Admission: RE | Admit: 2013-06-08 | Discharge: 2013-06-08 | Disposition: A | Discharge status: Home / Self Care | Payer: Medicare Other | Source: Ambulatory Visit | Attending: Family Medicine | Admitting: Family Medicine

## 2013-06-08 DIAGNOSIS — M810 Age-related osteoporosis without current pathological fracture: Secondary | ICD-10-CM | POA: Diagnosis not present

## 2013-06-08 DIAGNOSIS — Z23 Encounter for immunization: Secondary | ICD-10-CM

## 2013-06-09 ENCOUNTER — Other Ambulatory Visit: Payer: Medicare Other

## 2013-06-12 ENCOUNTER — Telehealth: Payer: Self-pay

## 2013-06-12 NOTE — Telephone Encounter (Signed)
Would recommend shingles vaccine first but she needs to make sure her insurance will cover it.

## 2013-06-12 NOTE — Telephone Encounter (Signed)
Pt left v/m; pt had pneumonia last month and called to see which immunization pt should get first; tetanus update or shingles vaccine. Pt said she only wants to get one immunization at a time.Please advise.

## 2013-06-12 NOTE — Telephone Encounter (Signed)
Pt advised.  Nurse visit scheduled for next week.

## 2013-06-17 ENCOUNTER — Ambulatory Visit (INDEPENDENT_AMBULATORY_CARE_PROVIDER_SITE_OTHER): Payer: Medicare Other | Admitting: *Deleted

## 2013-06-17 DIAGNOSIS — Z23 Encounter for immunization: Secondary | ICD-10-CM

## 2013-06-17 DIAGNOSIS — Z2911 Encounter for prophylactic immunotherapy for respiratory syncytial virus (RSV): Secondary | ICD-10-CM | POA: Diagnosis not present

## 2013-06-19 ENCOUNTER — Ambulatory Visit (INDEPENDENT_AMBULATORY_CARE_PROVIDER_SITE_OTHER): Payer: Medicare Other

## 2013-06-19 DIAGNOSIS — J309 Allergic rhinitis, unspecified: Secondary | ICD-10-CM

## 2013-06-26 ENCOUNTER — Ambulatory Visit (INDEPENDENT_AMBULATORY_CARE_PROVIDER_SITE_OTHER): Payer: Medicare Other

## 2013-06-26 DIAGNOSIS — J309 Allergic rhinitis, unspecified: Secondary | ICD-10-CM | POA: Diagnosis not present

## 2013-06-29 ENCOUNTER — Telehealth: Payer: Self-pay | Admitting: Family Medicine

## 2013-06-29 NOTE — Telephone Encounter (Signed)
Pt wants to schedule to a Td injection. Please call to schedule.

## 2013-06-29 NOTE — Telephone Encounter (Signed)
Patient called wanting to schedule an appointment for a tetanus vaccine. Patient stated that she is around a lot of children and not sure if she should get the vaccine with the whooping cough. Patient received her shingles vaccine on 06/17/13. Should patient wait 30 days from that vaccine to get the tetanus? Please advise which tetanus the patient should get.

## 2013-06-30 NOTE — Telephone Encounter (Signed)
Patient informed of results.  

## 2013-06-30 NOTE — Telephone Encounter (Signed)
Message copied by Eulis Manly on Tue Jun 30, 2013 10:21 AM ------      Message from: Samantha [name]      Created: Tue Jun 30, 2013  9:45 AM       Please call pt and let her know there has been no change in her bone density exam- continue her Vit D over the counter. We will repeat bone scan in 2 years ------

## 2013-07-03 ENCOUNTER — Ambulatory Visit (INDEPENDENT_AMBULATORY_CARE_PROVIDER_SITE_OTHER): Payer: Medicare Other

## 2013-07-03 DIAGNOSIS — J309 Allergic rhinitis, unspecified: Secondary | ICD-10-CM

## 2013-07-03 DIAGNOSIS — Z23 Encounter for immunization: Secondary | ICD-10-CM | POA: Diagnosis not present

## 2013-07-07 ENCOUNTER — Ambulatory Visit (INDEPENDENT_AMBULATORY_CARE_PROVIDER_SITE_OTHER): Payer: Medicare Other

## 2013-07-07 DIAGNOSIS — J309 Allergic rhinitis, unspecified: Secondary | ICD-10-CM | POA: Diagnosis not present

## 2013-07-13 DIAGNOSIS — H40009 Preglaucoma, unspecified, unspecified eye: Secondary | ICD-10-CM | POA: Diagnosis not present

## 2013-07-14 ENCOUNTER — Ambulatory Visit (INDEPENDENT_AMBULATORY_CARE_PROVIDER_SITE_OTHER): Payer: Medicare Other

## 2013-07-14 DIAGNOSIS — J309 Allergic rhinitis, unspecified: Secondary | ICD-10-CM

## 2013-07-22 ENCOUNTER — Ambulatory Visit (INDEPENDENT_AMBULATORY_CARE_PROVIDER_SITE_OTHER): Payer: Medicare Other

## 2013-07-22 DIAGNOSIS — J309 Allergic rhinitis, unspecified: Secondary | ICD-10-CM | POA: Diagnosis not present

## 2013-07-29 ENCOUNTER — Ambulatory Visit (INDEPENDENT_AMBULATORY_CARE_PROVIDER_SITE_OTHER): Payer: Medicare Other

## 2013-07-29 DIAGNOSIS — J309 Allergic rhinitis, unspecified: Secondary | ICD-10-CM

## 2013-08-12 ENCOUNTER — Ambulatory Visit (INDEPENDENT_AMBULATORY_CARE_PROVIDER_SITE_OTHER): Payer: Medicare Other

## 2013-08-12 DIAGNOSIS — J309 Allergic rhinitis, unspecified: Secondary | ICD-10-CM | POA: Diagnosis not present

## 2013-08-20 ENCOUNTER — Ambulatory Visit: Payer: Medicare Other

## 2013-08-20 DIAGNOSIS — M67919 Unspecified disorder of synovium and tendon, unspecified shoulder: Secondary | ICD-10-CM | POA: Diagnosis not present

## 2013-08-20 DIAGNOSIS — M7750 Other enthesopathy of unspecified foot: Secondary | ICD-10-CM | POA: Diagnosis not present

## 2013-08-20 DIAGNOSIS — M25519 Pain in unspecified shoulder: Secondary | ICD-10-CM | POA: Diagnosis not present

## 2013-08-26 ENCOUNTER — Telehealth: Payer: Self-pay | Admitting: Internal Medicine

## 2013-08-26 DIAGNOSIS — R509 Fever, unspecified: Secondary | ICD-10-CM | POA: Diagnosis not present

## 2013-08-26 DIAGNOSIS — R350 Frequency of micturition: Secondary | ICD-10-CM | POA: Diagnosis not present

## 2013-08-26 DIAGNOSIS — J0100 Acute maxillary sinusitis, unspecified: Secondary | ICD-10-CM | POA: Diagnosis not present

## 2013-08-26 NOTE — Telephone Encounter (Signed)
I called pt. She reports she is going to head to [facility] right now. She did not need anything further from [place]. Nothing further needed

## 2013-09-09 ENCOUNTER — Ambulatory Visit (INDEPENDENT_AMBULATORY_CARE_PROVIDER_SITE_OTHER): Payer: Medicare Other

## 2013-09-09 DIAGNOSIS — J309 Allergic rhinitis, unspecified: Secondary | ICD-10-CM

## 2013-09-14 ENCOUNTER — Ambulatory Visit (INDEPENDENT_AMBULATORY_CARE_PROVIDER_SITE_OTHER): Payer: Medicare Other

## 2013-09-14 DIAGNOSIS — J309 Allergic rhinitis, unspecified: Secondary | ICD-10-CM | POA: Diagnosis not present

## 2013-09-21 ENCOUNTER — Ambulatory Visit: Payer: Medicare Other

## 2013-09-22 ENCOUNTER — Other Ambulatory Visit: Payer: Self-pay

## 2013-09-22 DIAGNOSIS — Z1231 Encounter for screening mammogram for malignant neoplasm of breast: Secondary | ICD-10-CM

## 2013-09-24 ENCOUNTER — Ambulatory Visit (INDEPENDENT_AMBULATORY_CARE_PROVIDER_SITE_OTHER)
Admission: RE | Admit: 2013-09-24 | Discharge: 2013-09-24 | Disposition: A | Discharge status: Home / Self Care | Payer: Medicare Other | Source: Ambulatory Visit | Attending: Internal Medicine | Admitting: Internal Medicine

## 2013-09-24 ENCOUNTER — Encounter: Payer: Self-pay | Admitting: Internal Medicine

## 2013-09-24 ENCOUNTER — Ambulatory Visit (INDEPENDENT_AMBULATORY_CARE_PROVIDER_SITE_OTHER): Payer: Medicare Other | Admitting: Internal Medicine

## 2013-09-24 ENCOUNTER — Ambulatory Visit (INDEPENDENT_AMBULATORY_CARE_PROVIDER_SITE_OTHER): Payer: Medicare Other

## 2013-09-24 ENCOUNTER — Telehealth: Payer: Self-pay | Admitting: Internal Medicine

## 2013-09-24 VITALS — BP 122/60 | HR 76 | Ht 62.0 in | Wt 139.2 lb

## 2013-09-24 DIAGNOSIS — J069 Acute upper respiratory infection, unspecified: Secondary | ICD-10-CM | POA: Diagnosis not present

## 2013-09-24 DIAGNOSIS — J301 Allergic rhinitis due to pollen: Secondary | ICD-10-CM

## 2013-09-24 DIAGNOSIS — R059 Cough, unspecified: Secondary | ICD-10-CM | POA: Diagnosis not present

## 2013-09-24 DIAGNOSIS — R05 Cough: Secondary | ICD-10-CM | POA: Diagnosis not present

## 2013-09-24 DIAGNOSIS — J209 Acute bronchitis, unspecified: Secondary | ICD-10-CM

## 2013-09-24 DIAGNOSIS — J309 Allergic rhinitis, unspecified: Secondary | ICD-10-CM | POA: Diagnosis not present

## 2013-09-24 MED ORDER — ALBUTEROL SULFATE HFA 108 (90 BASE) MCG/ACT IN AERS
2.0000 | INHALATION_SPRAY | Freq: Four times a day (QID) | RESPIRATORY_TRACT | Status: DC | PRN
Start: 2013-09-24 — End: 2014-03-17

## 2013-09-24 MED ORDER — CEFDINIR 300 MG PO CAPS: 300.0000 mg | ORAL_CAPSULE | Freq: Two times a day (BID) | ORAL | Status: DC

## 2013-09-24 NOTE — Patient Instructions (Signed)
Script sent for cefdinir antibiotic  Sips of liquids, avoid getting chilled  Order CXR     Acute bronchitis

## 2013-09-24 NOTE — Telephone Encounter (Signed)
Got an appt w/ CY today instead.  [place]antha [name]

## 2013-09-24 NOTE — Progress Notes (Signed)
Patient ID: Samantha [name], female    DOB: Sep 07, 1946, 68 y.o.   MRN: [id number]  HPI 01/15/11- 65 yoF never smoker,  Followed for allergic rhinitis and ashtma with GERD.  Last here September 05, 2010. Did well since last here, but was dx'd with fibromyalgia/ Dr Samantha [name],  then suspected statin induced muscle pains.  Says breathing has been good. Needed to use rescue inhaler twice for some tightness after being out in wind/ pollen. Continues allergy vaccine, reporting occasional local reaction at 1:50.  05/24/11- 73 yoF never smoker,  Followed for allergic rhinitis and ashtma with GERD. 5 weeks head and throat congestion with bad sore throat. [place] [name] to Dr Samantha [name]- treated doxy as sinusitis- finished x1 week ago. Marland Kitchen Has had laryngitis and tussive right lateral rib pains x 2 days. Denies change in chronic fibromyalgia pains,  She has to travel a long way to get here from [name] and is interested if we can get her allergy shots given through the [name] office. We discussed antibiotic choices.  07/16/11- 72 yoF never smoker,  Followed for allergic rhinitis and asthma with GERD. She is feeling well. We were not able to work out an arrangement for her to get her allergy shots in [name]. She feels allergy control is much better on vaccine but does get some local soreness at the injection sites, which are rotated. Her GERD has been active.  01/14/12- 68 yoF never smoker,  Followed for allergic rhinitis and asthma with GERD. Breathing doing great; no asthma attacks; recently had flare up of sinus infection; still on vaccine and doing well On only one day she needed rescue inhaler after smoke exposure. We had called in an antibiotic for sinusitis-worked well. Continues allergy vaccine at 1:50 with injections here. History of Raynaud's phenomenon. Has to wear gloves indoors in the summer. Asked-allow a [name] form.  05/06/12- 65 yoF never smoker,  Followed for allergic rhinitis and asthma  with GERD. ACUTE: both ears having pressure and swollen neck area on Right side; increased sinus/throat congestion Onset of a cold one week ago. Feels weak has laryngitis some chills but no discolored sputum. Headache improved. Nasal congestion. Plan seem swollen. Chest little tight. Using her rescue inhaler. She says otherwise she had been doing better on allergy vaccine at 1:50 [name].  06/23/12- 65 yoF never smoker,  Followed for allergic rhinitis and asthma with GERD. Still on vaccine 1:50 [name] and doing well it; denies any itchy, watery eyes or sneezing at this time. Occasional minor nasal and chest congestion which she can control. Occasional right ear pains without change in hearing. Pending right knee arthroscopy  10/28/12-  66 yoF never smoker,  Followed for allergic rhinitis and asthma with GERD FOLLOWS FOR: still on vaccine; states its too hard to get here every week to get shots. Recent upper respiratory infection resolved with Mucinex. Occasional mild shortness of breath-his rescue inhaler successfully maybe 2 or 3 times a month with no real pattern. Allergy vaccine had been at 1:50 she has become come very irregular because logistics are inconvenient. We discussed stopping her vaccine now after 3 years, but she may decide to finish her current supply and continue through the spring pollen season  03/17/13-66 yoF never smoker,  Followed for allergic rhinitis and asthma with GERD FOLLOWS FOR: 4 month follow up - reports has been doing well.  did have some allergy symptoms 2 weeks ago with outdoor activity but nasal spray has helped.  allergy  injection has "really helped" After one hour and an outdoor park she experienced nasal congestion, ear ache, but quite nasal discharge. She says that is not a cold. Using Nasalcrom. Wants to stay on allergy vaccine at  05/19/13-66 yoF never smoker,  Followed for allergic rhinitis and asthma with GERD ACUTE VISIT: has not taken allergy injection x 2 weeks;  recently had walking PNA-follow up from this.  3 weeks ago developed increased sinus congestion treated by her primary physician with Augmentin ending 3 or 4 days ago.  Continues allergy vaccine 1:10 [name]  09/24/13- 73 yoF never smoker,  Followed for allergic rhinitis and asthma with GERD Follows For: Sinus and head congestion - PND - Ears feel full - Occas prod cough (clear) - Chest tightness Went to urgent care for bad sore throat in late December, treated for strep and sinusitis. Was well for over a week but then relapsed or new infection. 5 days sore throat, nasal congestion, mild chest congestion. Coughing clear mucus. No fever.   Review of Systems-see HPI Constitutional:   No weight loss, night sweats,  Fevers, chills, fatigue, lassitude. HEENT:   No headaches,  Difficulty swallowing,  Tooth/dental problems,  Sore throat,                No sneezing, itching, or +ear ache,   +nasal congestion, post nasal drip, CV:  No- chest pain,  Orthopnea, PND, swelling in lower extremities, anasarca, dizziness, palpitations GI  No heartburn, indigestion, abdominal pain, nausea, vomiting,  Resp: No shortness of breath with exertion or at rest.  No excess mucus, no productive cough,    +non-productive cough,  No coughing up of blood.  No change in color of mucus.  +infrequent wheezing.  Skin: no rash or lesions. GU:  MS:  No joint pain or swelling.   Psych:  No change in mood or affect. No depression or anxiety.  No memory loss.    Objective:   Physical Exam General- Alert, Oriented, Affect-appropriate, Distress- none acute; pleasant and talkative; well appearing Skin- rash-none, lesions- none, excoriation- none Lymphadenopathy- none Head- atraumatic            Eyes- Gross vision intact, PERRLA, conjunctivae clear secretions            Ears- +cerumen R            Nose- + turbinate edema , no-Septal dev, mucus, polyps, erosion, perforation . +Watery                     sniffing            Throat-  Melampatti III-IV.   TMs not  retracted , mucosa a little red , drainage- none, tonsils-                atrophic; frequent throat clearing, +hoarse Neck- flexible , trachea midline, no stridor , thyroid nl, carotid no bruit Chest - symmetrical excursion , unlabored           Heart/CV- RRR , no murmur , no gallop  , no rub, nl s1 s2                           - JVD- none , edema- none, stasis changes- none, varices- none           Lung- coarse breath sounds without distinct wheeze or rhonchi, cough+dry ,  dullness-none, rub- none           Chest wall-  Abd-  Br/ Gen/ Rectal- Not done, not indicated Extrem- cyanosis- none, clubbing, none, atrophy- none, strength- nl. + hands dry/ cool Neuro- grossly intact to observation

## 2013-10-01 ENCOUNTER — Ambulatory Visit (INDEPENDENT_AMBULATORY_CARE_PROVIDER_SITE_OTHER): Payer: Medicare Other

## 2013-10-01 DIAGNOSIS — J309 Allergic rhinitis, unspecified: Secondary | ICD-10-CM | POA: Diagnosis not present

## 2013-10-01 DIAGNOSIS — H1045 Other chronic allergic conjunctivitis: Secondary | ICD-10-CM | POA: Diagnosis not present

## 2013-10-05 ENCOUNTER — Telehealth: Payer: Self-pay | Admitting: Internal Medicine

## 2013-10-05 MED ORDER — CLARITHROMYCIN 500 MG PO TABS: 500.0000 mg | ORAL_TABLET | Freq: Two times a day (BID) | ORAL | Status: DC

## 2013-10-05 NOTE — Telephone Encounter (Signed)
Pt aware  Rx was sent to pharm  [facility] had already called her

## 2013-10-05 NOTE — Telephone Encounter (Signed)
Offer biaxin 500 mg, # 14, 1 twice daily after meals 

## 2013-10-05 NOTE — Telephone Encounter (Signed)
Called and spoke with pt. She reports she finished her ABX 09/30/13. Pt reports laryngitis is not as bad, nasal congestion, PND, SOB at rest, dry cough, chest tx.. She is using nasalcrom, taking mucinex and using her inhalers. Please advise Dr. Annamaria Boots thanks  Allergies  Allergen Reactions  . Cefuroxime Axetil     REACTION: pt not sure  . Sulfonamide Derivatives     REACTION: pt not sure    Current Outpatient Prescriptions on File Prior to Visit  Medication Sig Dispense Refill  . albuterol (PROVENTIL HFA) 108 (90 BASE) MCG/ACT inhaler Inhale 2 puffs into the lungs every 6 (six) hours as needed.  3 Inhaler  3  . Ascorbic Acid (VITAMIN C) 500 MG tablet Take 500 mg by mouth daily.        Marland Kitchen aspirin 81 MG tablet Take 81 mg by mouth daily.      . bifidobacterium infantis (ALIGN) capsule Take 1 capsule by mouth daily.        . calcium carbonate 200 MG capsule Take 200 mg by mouth daily.       . cefdinir (OMNICEF) 300 MG capsule Take 1 capsule (300 mg total) by mouth 2 (two) times daily.  14 capsule  0  . cetirizine (ZYRTEC) 10 MG tablet Take 1 tablet (10 mg total) by mouth daily.  90 tablet  3  . Cholecalciferol (VITAMIN D3) 400 UNITS tablet Take 400 Units by mouth 2 (two) times daily with a meal.        . Coenzyme Q-10 60 MG CAPS Take 1 capsule by mouth 2 (two) times daily.       . cromolyn (NASALCROM) 5.2 MG/ACT nasal spray 1-2 sprays each nostril four times a day as needed       . Cyanocobalamin (VITAMIN B-12) 2500 MCG SUBL Place 1 tablet under the tongue daily. Contains Folic Acid, B6, and Biotin      . cyclobenzaprine (FLEXERIL) 10 MG tablet 1 tablet by mouth at bedtime as needed       . EPINEPHrine (EPIPEN) 0.3 mg/0.3 mL DEVI For severe allergic reaction  1 Device  11  . guaiFENesin (MUCINEX) 600 MG 12 hr tablet Take 1,200 mg by mouth 2 (two) times daily.      Marland Kitchen losartan-hydrochlorothiazide (HYZAAR) 100-12.5 MG per tablet Take 1 tablet by mouth daily.  30 tablet  3  . Magnesium Malate POWD  (1000mg  tabs) 1 tablet by mouth two times a day      . Manganese Gluconate 50 MG TABS Take 1 tablet by mouth daily.        . montelukast (SINGULAIR) 10 MG tablet 1 daily  90 tablet  3  . Omega-3 Fatty Acids (OMEGA 3 PO) Take one tablet daily      . omeprazole (PRILOSEC) 40 MG capsule Take 1 capsule (40 mg total) by mouth daily.  90 capsule  3  . thiamine 100 MG tablet Take 100 mg by mouth daily.         No current facility-administered medications on file prior to visit.

## 2013-10-07 ENCOUNTER — Ambulatory Visit (INDEPENDENT_AMBULATORY_CARE_PROVIDER_SITE_OTHER): Payer: Medicare Other

## 2013-10-07 DIAGNOSIS — J309 Allergic rhinitis, unspecified: Secondary | ICD-10-CM

## 2013-10-14 ENCOUNTER — Ambulatory Visit: Payer: Medicare Other

## 2013-10-15 ENCOUNTER — Telehealth: Payer: Self-pay | Admitting: Internal Medicine

## 2013-10-15 ENCOUNTER — Encounter: Payer: Self-pay | Admitting: Internal Medicine

## 2013-10-15 ENCOUNTER — Ambulatory Visit (INDEPENDENT_AMBULATORY_CARE_PROVIDER_SITE_OTHER): Payer: Medicare Other | Admitting: Internal Medicine

## 2013-10-15 VITALS — BP 112/56 | HR 108 | Ht 62.0 in | Wt 137.8 lb

## 2013-10-15 DIAGNOSIS — J301 Allergic rhinitis due to pollen: Secondary | ICD-10-CM | POA: Diagnosis not present

## 2013-10-15 DIAGNOSIS — J45998 Other asthma: Secondary | ICD-10-CM

## 2013-10-15 DIAGNOSIS — J069 Acute upper respiratory infection, unspecified: Secondary | ICD-10-CM | POA: Diagnosis not present

## 2013-10-15 DIAGNOSIS — J45909 Unspecified asthma, uncomplicated: Secondary | ICD-10-CM | POA: Diagnosis not present

## 2013-10-15 MED ORDER — PREDNISONE 20 MG PO TABS: ORAL_TABLET | ORAL | Status: DC

## 2013-10-15 MED ORDER — METHYLPREDNISOLONE ACETATE 80 MG/ML IJ SUSP
80.0000 mg | Freq: Once | INTRAMUSCULAR | Status: AC
  Administered 2013-10-15: 80 mg via INTRAMUSCULAR

## 2013-10-15 MED ORDER — PROMETHAZINE-CODEINE 6.25-10 MG/5ML PO SYRP: 5.0000 mL | ORAL_SOLUTION | Freq: Four times a day (QID) | ORAL | Status: DC | PRN

## 2013-10-15 NOTE — Patient Instructions (Signed)
Script for prednisone printed  Script for cough syrup printed  Depo 80

## 2013-10-15 NOTE — Telephone Encounter (Signed)
Per CDY: ok to add on this afternoon at 3:30pm  Called spoke with patient, appt scheduled with CDY this afternoon 2.5.15 @ 3.30pm.  Will sign off.

## 2013-10-15 NOTE — Telephone Encounter (Signed)
Spoke with pt. She c/o laryngitis, sore throat, chest and nasal congestion, pnd, prod cough thick white foamy to light yellow phlem x couple weeks. She has been on 2 rounds of ABX. She is wanting to be seen today. Please advise Dr. Annamaria Boots thanks Last OV 09/24/13  Allergies  Allergen Reactions  . Cefuroxime Axetil     REACTION: pt not sure  . Sulfonamide Derivatives     REACTION: pt not sure    Current Outpatient Prescriptions on File Prior to Visit  Medication Sig Dispense Refill  . albuterol (PROVENTIL HFA) 108 (90 BASE) MCG/ACT inhaler Inhale 2 puffs into the lungs every 6 (six) hours as needed.  3 Inhaler  3  . Ascorbic Acid (VITAMIN C) 500 MG tablet Take 500 mg by mouth daily.        Marland Kitchen aspirin 81 MG tablet Take 81 mg by mouth daily.      . bifidobacterium infantis (ALIGN) capsule Take 1 capsule by mouth daily.        . calcium carbonate 200 MG capsule Take 200 mg by mouth daily.       . cefdinir (OMNICEF) 300 MG capsule Take 1 capsule (300 mg total) by mouth 2 (two) times daily.  14 capsule  0  . cetirizine (ZYRTEC) 10 MG tablet Take 1 tablet (10 mg total) by mouth daily.  90 tablet  3  . Cholecalciferol (VITAMIN D3) 400 UNITS tablet Take 400 Units by mouth 2 (two) times daily with a meal.        . clarithromycin (BIAXIN) 500 MG tablet Take 1 tablet (500 mg total) by mouth 2 (two) times daily.  14 tablet  0  . Coenzyme Q-10 60 MG CAPS Take 1 capsule by mouth 2 (two) times daily.       . cromolyn (NASALCROM) 5.2 MG/ACT nasal spray 1-2 sprays each nostril four times a day as needed       . Cyanocobalamin (VITAMIN B-12) 2500 MCG SUBL Place 1 tablet under the tongue daily. Contains Folic Acid, B6, and Biotin      . cyclobenzaprine (FLEXERIL) 10 MG tablet 1 tablet by mouth at bedtime as needed       . EPINEPHrine (EPIPEN) 0.3 mg/0.3 mL DEVI For severe allergic reaction  1 Device  11  . guaiFENesin (MUCINEX) 600 MG 12 hr tablet Take 1,200 mg by mouth 2 (two) times daily.      Marland Kitchen  losartan-hydrochlorothiazide (HYZAAR) 100-12.5 MG per tablet Take 1 tablet by mouth daily.  30 tablet  3  . Magnesium Malate POWD (1000mg  tabs) 1 tablet by mouth two times a day      . Manganese Gluconate 50 MG TABS Take 1 tablet by mouth daily.        . montelukast (SINGULAIR) 10 MG tablet 1 daily  90 tablet  3  . Omega-3 Fatty Acids (OMEGA 3 PO) Take one tablet daily      . omeprazole (PRILOSEC) 40 MG capsule Take 1 capsule (40 mg total) by mouth daily.  90 capsule  3  . thiamine 100 MG tablet Take 100 mg by mouth daily.         No current facility-administered medications on file prior to visit.

## 2013-10-15 NOTE — Progress Notes (Signed)
Patient ID: Samantha [name], female    DOB: Sep 07, 1946, 68 y.o.   MRN: [id number]  HPI 01/15/11- 68 yoF never smoker,  Followed for allergic rhinitis and ashtma with GERD.  Last here September 05, 2010. Did well since last here, but was dx'd with fibromyalgia/ Dr Patrecia Pour,  then suspected statin induced muscle pains.  Says breathing has been good. Needed to use rescue inhaler twice for some tightness after being out in wind/ pollen. Continues allergy vaccine, reporting occasional local reaction at 1:50.  05/24/11- 68 yoF never smoker,  Followed for allergic rhinitis and ashtma with GERD. 5 weeks head and throat congestion with bad sore throat. [name] to Dr Loanne Drilling- treated doxy as sinusitis- finished x1 week ago. Marland Kitchen Has had laryngitis and tussive right lateral rib pains x 2 days. Denies change in chronic fibromyalgia pains,  She has to travel a long way to get here from [place] and is interested if we can get her allergy shots given through the [place] office. We discussed antibiotic choices.  07/16/11- 68 yoF never smoker,  Followed for allergic rhinitis and asthma with GERD. She is feeling well. We were not able to work out an arrangement for her to get her allergy shots in [place]. She feels allergy control is much better on vaccine but does get some local soreness at the injection sites, which are rotated. Her GERD has been active.  01/14/12- 68 yoF never smoker,  Followed for allergic rhinitis and asthma with GERD. Breathing doing great; no asthma attacks; recently had flare up of sinus infection; still on vaccine and doing well On only one day she needed rescue inhaler after smoke exposure. We had called in an antibiotic for sinusitis-worked well. Continues allergy vaccine at 1:50 with injections here. History of Raynaud's phenomenon. Has to wear gloves indoors in the summer. Asked-allow a [employer] form.  05/06/12- 68 yoF never smoker,  Followed for allergic rhinitis and asthma  with GERD. ACUTE: both ears having pressure and swollen neck area on Right side; increased sinus/throat congestion Onset of a cold one week ago. Feels weak has laryngitis some chills but no discolored sputum. Headache improved. Nasal congestion. Plan seem swollen. Chest little tight. Using her rescue inhaler. She says otherwise she had been doing better on allergy vaccine at 1:50 [place].  06/23/12- 68 yoF never smoker,  Followed for allergic rhinitis and asthma with GERD. Still on vaccine 1:50 GH and doing well it; denies any itchy, watery eyes or sneezing at this time. Occasional minor nasal and chest congestion which she can control. Occasional right ear pains without change in hearing. Pending right knee arthroscopy  10/28/12-  68 yoF never smoker,  Followed for allergic rhinitis and asthma with GERD FOLLOWS FOR: still on vaccine; states its too hard to get here every week to get shots. Recent upper respiratory infection resolved with Mucinex. Occasional mild shortness of breath-his rescue inhaler successfully maybe 2 or 3 times a month with no real pattern. Allergy vaccine had been at 1:50 she has become come very irregular because logistics are inconvenient. We discussed stopping her vaccine now after 3 years, but she may decide to finish her current supply and continue through the spring pollen season  03/17/13-68 yoF never smoker,  Followed for allergic rhinitis and asthma with GERD FOLLOWS FOR: 4 month follow up - reports has been doing well.  did have some allergy symptoms 2 weeks ago with outdoor activity but nasal spray has helped.  allergy  injection has "really helped" After one hour and an outdoor park she experienced nasal congestion, ear ache, but quite nasal discharge. She says that is not a cold. Using Nasalcrom. Wants to stay on allergy vaccine at  05/19/13-68 yoF never smoker,  Followed for allergic rhinitis and asthma with GERD ACUTE VISIT: has not taken allergy injection x 2 weeks;  recently had walking PNA-follow up from this.  3 weeks ago developed increased sinus congestion treated by her primary physician with Augmentin ending 3 or 4 days ago.  Continues allergy vaccine 1:10 GH  09/24/13- 68 yoF never smoker,  Followed for allergic rhinitis and asthma with GERD Follows For: Sinus and head congestion - PND - Ears feel full - Occas prod cough (clear) - Chest tightness Continues allergy vaccine 1:10 GH ACUTE VISIT: sore throat, chest and nasal congestion; cough-productive-thick and white to pale yellow in color.  We sent biaxin 1/15- helped. Got worse again this weekend with itching eyes, sore throat, head and chest congestion, temperature 99.5. Sinuses are clearing using Mucinex. CXR 09/24/13 IMPRESSION:  No active cardiopulmonary disease.  Electronically Signed  By: Kathreen Devoid  On: 09/24/2013 14:54  Review of Systems-see HPI Constitutional:   No weight loss, night sweats,  Fevers, chills, fatigue, lassitude. HEENT:   No headaches,  Difficulty swallowing,  Tooth/dental problems,  Sore throat,                No sneezing, itching, or +ear ache,   +nasal congestion, post nasal drip, CV:  No- chest pain, orthopnea, PND, swelling in lower extremities, anasarca, dizziness, palpitations GI  No heartburn, indigestion, abdominal pain, nausea, vomiting,  Resp: No shortness of breath with exertion or at rest.  No excess mucus, no productive cough,  No           +non-productive cough,  No coughing up of blood.  No change in color of mucus.  +infrequent               wheezing.   Skin: no rash or lesions. GU:  MS:  No joint pain or swelling.   Psych:  No change in mood or affect. No depression or anxiety.  No memory loss.    Objective:   Physical Exam General- Alert, Oriented, Affect-appropriate, Distress- none acute; pleasant and talkative; well appearing Skin- rash-none, lesions- none, excoriation- none Lymphadenopathy- none Head- atraumatic            Eyes- Gross vision  intact, PERRLA, conjunctivae clear secretions, + periorbital edema            Ears- +cerumen R            Nose- + turbinate edema , no-Septal dev, mucus, polyps, erosion, perforation . +Watery                        sniffing            Throat- Melampatti III.   TMs not  retracted , mucosa a little red , drainage- none, tonsils-                 atrophic;  frequent throat clearing, +hoarse Neck- flexible , trachea midline, no stridor , thyroid nl, carotid no bruit Chest - symmetrical excursion , unlabored           Heart/CV- RRR , no murmur , no gallop  , no rub, nl s1 s2                           -  JVD- none , edema- none, stasis changes- none, varices- none           Lung- coarse breath sounds without distinct wheeze or rhonchi, cough+dry , dullness-none, rub- none           Chest wall-  Abd-  Br/ Gen/ Rectal- Not done, not indicated Extrem- cyanosis- none, clubbing, none, atrophy- none, strength- nl. + hands dry/ cool Neuro- grossly intact to observation

## 2013-10-18 ENCOUNTER — Encounter: Payer: Self-pay | Admitting: Internal Medicine

## 2013-10-18 DIAGNOSIS — J069 Acute upper respiratory infection, unspecified: Secondary | ICD-10-CM | POA: Insufficient documentation

## 2013-10-18 NOTE — Assessment & Plan Note (Signed)
She asks about updating her skin test since it has been several years. We discussed symptoms.

## 2013-10-18 NOTE — Assessment & Plan Note (Signed)
Continue allergy vaccine. Plan retest

## 2013-10-18 NOTE — Assessment & Plan Note (Signed)
Rhinitis and tracheobronchitis Plan-cefdinir, CXR. We will give Prevnar when returns after this infection.

## 2013-10-21 ENCOUNTER — Ambulatory Visit
Admission: RE | Admit: 2013-10-21 | Discharge: 2013-10-21 | Disposition: A | Discharge status: Home / Self Care | Payer: Medicare Other | Source: Ambulatory Visit

## 2013-10-21 DIAGNOSIS — Z1231 Encounter for screening mammogram for malignant neoplasm of breast: Secondary | ICD-10-CM | POA: Diagnosis not present

## 2013-10-21 DIAGNOSIS — J309 Allergic rhinitis, unspecified: Secondary | ICD-10-CM | POA: Diagnosis not present

## 2013-10-21 DIAGNOSIS — J328 Other chronic sinusitis: Secondary | ICD-10-CM | POA: Diagnosis not present

## 2013-10-22 ENCOUNTER — Ambulatory Visit: Payer: Medicare Other

## 2013-10-26 ENCOUNTER — Ambulatory Visit (INDEPENDENT_AMBULATORY_CARE_PROVIDER_SITE_OTHER): Payer: Medicare Other

## 2013-10-26 DIAGNOSIS — J309 Allergic rhinitis, unspecified: Secondary | ICD-10-CM | POA: Diagnosis not present

## 2013-11-04 ENCOUNTER — Ambulatory Visit (INDEPENDENT_AMBULATORY_CARE_PROVIDER_SITE_OTHER): Payer: Medicare Other

## 2013-11-04 DIAGNOSIS — J309 Allergic rhinitis, unspecified: Secondary | ICD-10-CM | POA: Diagnosis not present

## 2013-11-07 NOTE — Assessment & Plan Note (Signed)
Rhinitis now is probably part of a viral tracheobronchitis Plan-Depo-Medrol, prednisone

## 2013-11-07 NOTE — Assessment & Plan Note (Signed)
Acute tracheobronchitis Plan Depo-Medrol, prednisone, cough syrup

## 2013-11-09 ENCOUNTER — Ambulatory Visit (INDEPENDENT_AMBULATORY_CARE_PROVIDER_SITE_OTHER): Payer: Medicare Other

## 2013-11-09 DIAGNOSIS — J309 Allergic rhinitis, unspecified: Secondary | ICD-10-CM | POA: Diagnosis not present

## 2013-11-16 ENCOUNTER — Ambulatory Visit (INDEPENDENT_AMBULATORY_CARE_PROVIDER_SITE_OTHER): Payer: Medicare Other

## 2013-11-16 DIAGNOSIS — J309 Allergic rhinitis, unspecified: Secondary | ICD-10-CM

## 2013-11-19 ENCOUNTER — Encounter: Payer: Self-pay | Admitting: Family Medicine

## 2013-11-19 ENCOUNTER — Ambulatory Visit (INDEPENDENT_AMBULATORY_CARE_PROVIDER_SITE_OTHER): Payer: Medicare Other | Admitting: Family Medicine

## 2013-11-19 ENCOUNTER — Ambulatory Visit: Payer: Medicare Other | Admitting: Family Medicine

## 2013-11-19 VITALS — BP 114/58 | HR 90 | Temp 98.8°F | Ht 61.5 in | Wt 136.0 lb

## 2013-11-19 DIAGNOSIS — R197 Diarrhea, unspecified: Secondary | ICD-10-CM | POA: Diagnosis not present

## 2013-11-19 MED ORDER — ONDANSETRON HCL 4 MG PO TABS: 4.0000 mg | ORAL_TABLET | Freq: Three times a day (TID) | ORAL | Status: DC | PRN

## 2013-11-19 NOTE — Patient Instructions (Signed)
Viral Gastroenteritis Viral gastroenteritis is also known as stomach flu. This condition affects the stomach and intestinal tract. It can cause sudden diarrhea and vomiting. The illness typically lasts 3 to 8 days. Most people develop an immune response that eventually gets rid of the virus. While this natural response develops, the virus can make you quite ill. CAUSES  Many different viruses can cause gastroenteritis, such as rotavirus or noroviruses. You can catch one of these viruses by consuming contaminated food or water. You may also catch a virus by sharing utensils or other personal items with an infected person or by touching a contaminated surface. SYMPTOMS  The most common symptoms are diarrhea and vomiting. These problems can cause a severe loss of body fluids (dehydration) and a body salt (electrolyte) imbalance. Other symptoms may include:  Fever.  Headache.  Fatigue.  Abdominal pain. DIAGNOSIS  Your caregiver can usually diagnose viral gastroenteritis based on your symptoms and a physical exam. A stool sample may also be taken to test for the presence of viruses or other infections. TREATMENT  This illness typically goes away on its own. Treatments are aimed at rehydration. The most serious cases of viral gastroenteritis involve vomiting so severely that you are not able to keep fluids down. In these cases, fluids must be given through an intravenous line (IV). HOME CARE INSTRUCTIONS   Drink enough fluids to keep your urine clear or pale yellow. Drink small amounts of fluids frequently and increase the amounts as tolerated.  Ask your caregiver for specific rehydration instructions.  Avoid:  Foods high in sugar.  Alcohol.  Carbonated drinks.  Tobacco.  Juice.  Caffeine drinks.  Extremely hot or cold fluids.  Fatty, greasy foods.  Too much intake of anything at one time.  Dairy products until 24 to 48 hours after diarrhea stops.  You may consume probiotics.  Probiotics are active cultures of beneficial bacteria. They may lessen the amount and number of diarrheal stools in adults. Probiotics can be found in yogurt with active cultures and in supplements.  Wash your hands well to avoid spreading the virus.  Only take over-the-counter or prescription medicines for pain, discomfort, or fever as directed by your caregiver. Do not give aspirin to children. Antidiarrheal medicines are not recommended.  Ask your caregiver if you should continue to take your regular prescribed and over-the-counter medicines.  Keep all follow-up appointments as directed by your caregiver. SEEK IMMEDIATE MEDICAL CARE IF:   You are unable to keep fluids down.  You do not urinate at least once every 6 to 8 hours.  You develop shortness of breath.  You notice blood in your stool or vomit. This may look like coffee grounds.  You have abdominal pain that increases or is concentrated in one small area (localized).  You have persistent vomiting or diarrhea.  You have a fever.  The patient is a child younger than 3 months, and he or she has a fever.  The patient is a child older than 3 months, and he or she has a fever and persistent symptoms.  The patient is a child older than 3 months, and he or she has a fever and symptoms suddenly get worse.  The patient is a baby, and he or she has no tears when crying. MAKE SURE YOU:   Understand these instructions.  Will watch your condition.  Will get help right away if you are not doing well or get worse. Document Released: 08/27/2005 Document Revised: 11/19/2011 Document Reviewed: 06/13/2011   [employer] Patient Information 2014 [employer]  

## 2013-11-19 NOTE — Progress Notes (Signed)
Chauncey Cruel) Samantha [name] is a 68 y.o. female with complaint of gastrointestinal symptoms of fevers, watery diarrhea, lower abdominal cramps, nausea, vomiting for 3 days. No blood in stool.  Patient Active Problem List   Diagnosis Date Noted  . Acute upper respiratory infections of unspecified site 10/18/2013  . Acute maxillary sinusitis 05/27/2013  . Encounter for Medicare annual wellness exam 05/13/2013  . Fibromyalgia 02/26/2013  . Allergic rhinitis due to pollen 11/20/2010  . 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 IRRITABLE BOWEL SYNDROME 04/06/2010  . THYROID NODULE, RIGHT 12/07/2009  . URINARY CALCULUS 11/28/2009  . OSTEOARTHRITIS 11/28/2009  . HYPERGLYCEMIA 11/28/2009  . FIBROIDS, UTERUS 05/10/2008  . INSOMNIA 05/10/2008  . ASYMPTOMATIC POSTMENOPAUSAL STATUS 05/10/2008  . HYPERTENSION 01/28/2008  . HYPERCHOLESTEROLEMIA 01/07/2008  . GLAUCOMA 01/07/2008  . 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 GERD 04/14/2007  . OSTEOPOROSIS 04/14/2007  . COLONIC POLYPS, HX OF 04/14/2007   Past Medical History  Diagnosis Date  . ASTHMA 04/14/2007  . HYPERCHOLESTEROLEMIA 01/07/2008  . HYPERTENSION 01/28/2008  . SINUSITIS 02/10/2009  . ALLERGIC RHINITIS 07/26/2008  . INSOMNIA 05/10/2008  . FIBROIDS, UTERUS 05/10/2008  . OSTEOPOROSIS 04/14/2007  . OSTEOARTHRITIS 11/28/2009  . GERD 04/14/2007  . Irritable bowel syndrome 04/06/2010  . CHEST PAIN 08/26/2008  . HYPERGLYCEMIA 11/28/2009  . 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 ASYMPTOMATIC POSTMENOPAUSAL STATUS 05/10/2008  . 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 Episcleritis   . Fibromyalgia    Past Surgical History  Procedure Laterality Date  . Nasal polyp surgery      Polypectomy and septoplasty  . Laparoscopy    . Neck injury  1983  . Stress cardiolite  02/13/2002  . Electrocardiogram  12/04/2006  . Colonoscopy w/ polypectomy     History  Substance Use Topics  . Smoking status: Never Smoker   . Smokeless tobacco: Never Used  . Alcohol  Use: No   Family History  Problem Relation Age of Onset  . Heart disease Father     CHF  . Cancer Neg Hx     No FH of Colon Cancer   Allergies  Allergen Reactions  . Cefuroxime Axetil     REACTION: pt not sure  . Sulfonamide Derivatives     REACTION: pt not sure   Current Outpatient Prescriptions on File Prior to Visit  Medication Sig Dispense Refill  . albuterol (PROVENTIL HFA) 108 (90 BASE) MCG/ACT inhaler Inhale 2 puffs into the lungs every 6 (six) hours as needed.  3 Inhaler  3  . Ascorbic Acid (VITAMIN C) 500 MG tablet Take 500 mg by mouth daily.        . bifidobacterium infantis (ALIGN) capsule Take 1 capsule by mouth daily.        . calcium carbonate 200 MG capsule Take 200 mg by mouth daily.       . cetirizine (ZYRTEC) 10 MG tablet Take 1 tablet (10 mg total) by mouth daily.  90 tablet  3  . Cholecalciferol (VITAMIN D3) 400 UNITS tablet Take 400 Units by mouth 2 (two) times daily with a meal.        . Coenzyme Q-10 60 MG CAPS Take 1 capsule by mouth 2 (two) times daily.       . cromolyn (NASALCROM) 5.2 MG/ACT nasal spray 1-2 sprays each nostril four times a day as needed       . Cyanocobalamin (VITAMIN B-12) 2500 MCG SUBL Place 1 tablet under the tongue daily. Contains Folic Acid, B6,  and Biotin      . EPINEPHrine (EPIPEN) 0.3 mg/0.3 mL DEVI For severe allergic reaction  1 Device  11  . guaiFENesin (MUCINEX) 600 MG 12 hr tablet Take 1,200 mg by mouth 2 (two) times daily.      Marland Kitchen losartan-hydrochlorothiazide (HYZAAR) 100-12.5 MG per tablet Take 1 tablet by mouth daily.  30 tablet  3  . Magnesium Malate POWD (1000mg  tabs) 1 tablet by mouth two times a day      . Manganese Gluconate 50 MG TABS Take 1 tablet by mouth daily.        . montelukast (SINGULAIR) 10 MG tablet 1 daily  90 tablet  3  . Omega-3 Fatty Acids (OMEGA 3 PO) Take one tablet daily      . omeprazole (PRILOSEC) 40 MG capsule Take 1 capsule (40 mg total) by mouth daily.  90 capsule  3  . thiamine 100 MG tablet Take  100 mg by mouth daily.         No current facility-administered medications on file prior to visit.   The PMH, PSH, Social History, Family History, Medications, and allergies have been reviewed in [facility], and have been updated if relevant.  (O)  BP 114/58  Pulse 90  Temp(Src) 98.8 F (37.1 C) (Oral)  Ht 5' 1.5" (1.562 m)  Wt 136 lb (61.689 kg)  BMI 25.28 kg/m2  SpO2 98%  Physical exam reveals the patient appears well. Hydration status: well hydrated. Abdomen: abdomen is soft without significant tenderness, masses, organomegaly or guarding..  (A) Viral Gastroenteritis  (P) I have recommended small amounts clear fluids frequently, soups, juices, water and advance diet as tolerated. Zofran as needed for nausea.  Return office visit if symptoms persist or worsen; I have alerted the patient to call if high fever, dehydration, marked weakness, fainting, increased abdominal pain, blood in stool or vomit.

## 2013-11-19 NOTE — Progress Notes (Signed)
Pre visit review using our clinic review tool, if applicable. No additional management support is needed unless otherwise documented below in the visit note. 

## 2013-11-23 ENCOUNTER — Ambulatory Visit: Payer: Medicare Other

## 2013-11-25 ENCOUNTER — Ambulatory Visit (INDEPENDENT_AMBULATORY_CARE_PROVIDER_SITE_OTHER): Payer: Medicare Other

## 2013-11-25 DIAGNOSIS — J309 Allergic rhinitis, unspecified: Secondary | ICD-10-CM

## 2013-11-27 ENCOUNTER — Ambulatory Visit (INDEPENDENT_AMBULATORY_CARE_PROVIDER_SITE_OTHER): Payer: Medicare Other

## 2013-11-27 DIAGNOSIS — J309 Allergic rhinitis, unspecified: Secondary | ICD-10-CM | POA: Diagnosis not present

## 2013-12-01 ENCOUNTER — Ambulatory Visit (INDEPENDENT_AMBULATORY_CARE_PROVIDER_SITE_OTHER): Payer: Medicare Other

## 2013-12-01 DIAGNOSIS — J309 Allergic rhinitis, unspecified: Secondary | ICD-10-CM | POA: Diagnosis not present

## 2013-12-04 ENCOUNTER — Encounter: Payer: Self-pay | Admitting: Internal Medicine

## 2013-12-08 ENCOUNTER — Ambulatory Visit (INDEPENDENT_AMBULATORY_CARE_PROVIDER_SITE_OTHER): Payer: Medicare Other

## 2013-12-08 DIAGNOSIS — J309 Allergic rhinitis, unspecified: Secondary | ICD-10-CM

## 2013-12-16 ENCOUNTER — Ambulatory Visit (INDEPENDENT_AMBULATORY_CARE_PROVIDER_SITE_OTHER): Payer: Medicare Other

## 2013-12-16 DIAGNOSIS — J309 Allergic rhinitis, unspecified: Secondary | ICD-10-CM | POA: Diagnosis not present

## 2013-12-21 ENCOUNTER — Ambulatory Visit (INDEPENDENT_AMBULATORY_CARE_PROVIDER_SITE_OTHER): Payer: Medicare Other

## 2013-12-21 DIAGNOSIS — J309 Allergic rhinitis, unspecified: Secondary | ICD-10-CM

## 2013-12-30 ENCOUNTER — Ambulatory Visit (INDEPENDENT_AMBULATORY_CARE_PROVIDER_SITE_OTHER): Payer: Medicare Other

## 2013-12-30 DIAGNOSIS — J309 Allergic rhinitis, unspecified: Secondary | ICD-10-CM

## 2014-01-05 ENCOUNTER — Ambulatory Visit (INDEPENDENT_AMBULATORY_CARE_PROVIDER_SITE_OTHER): Payer: Medicare Other

## 2014-01-05 DIAGNOSIS — J309 Allergic rhinitis, unspecified: Secondary | ICD-10-CM

## 2014-01-11 DIAGNOSIS — H40009 Preglaucoma, unspecified, unspecified eye: Secondary | ICD-10-CM | POA: Diagnosis not present

## 2014-01-15 ENCOUNTER — Ambulatory Visit (INDEPENDENT_AMBULATORY_CARE_PROVIDER_SITE_OTHER): Payer: Medicare Other

## 2014-01-15 DIAGNOSIS — J309 Allergic rhinitis, unspecified: Secondary | ICD-10-CM | POA: Diagnosis not present

## 2014-01-20 ENCOUNTER — Ambulatory Visit (INDEPENDENT_AMBULATORY_CARE_PROVIDER_SITE_OTHER): Payer: Medicare Other

## 2014-01-20 DIAGNOSIS — J309 Allergic rhinitis, unspecified: Secondary | ICD-10-CM | POA: Diagnosis not present

## 2014-01-20 DIAGNOSIS — Z01419 Encounter for gynecological examination (general) (routine) without abnormal findings: Secondary | ICD-10-CM | POA: Diagnosis not present

## 2014-01-26 ENCOUNTER — Ambulatory Visit (INDEPENDENT_AMBULATORY_CARE_PROVIDER_SITE_OTHER): Payer: Medicare Other

## 2014-01-26 DIAGNOSIS — J309 Allergic rhinitis, unspecified: Secondary | ICD-10-CM

## 2014-02-09 DIAGNOSIS — H4010X0 Unspecified open-angle glaucoma, stage unspecified: Secondary | ICD-10-CM | POA: Diagnosis not present

## 2014-02-11 ENCOUNTER — Ambulatory Visit (INDEPENDENT_AMBULATORY_CARE_PROVIDER_SITE_OTHER): Payer: Medicare Other

## 2014-02-11 DIAGNOSIS — N949 Unspecified condition associated with female genital organs and menstrual cycle: Secondary | ICD-10-CM | POA: Diagnosis not present

## 2014-02-11 DIAGNOSIS — J309 Allergic rhinitis, unspecified: Secondary | ICD-10-CM | POA: Diagnosis not present

## 2014-02-11 DIAGNOSIS — R141 Gas pain: Secondary | ICD-10-CM | POA: Diagnosis not present

## 2014-02-17 ENCOUNTER — Ambulatory Visit (INDEPENDENT_AMBULATORY_CARE_PROVIDER_SITE_OTHER): Payer: Medicare Other

## 2014-02-17 DIAGNOSIS — J309 Allergic rhinitis, unspecified: Secondary | ICD-10-CM | POA: Diagnosis not present

## 2014-02-25 DIAGNOSIS — H40009 Preglaucoma, unspecified, unspecified eye: Secondary | ICD-10-CM | POA: Diagnosis not present

## 2014-02-26 ENCOUNTER — Ambulatory Visit (INDEPENDENT_AMBULATORY_CARE_PROVIDER_SITE_OTHER): Payer: Medicare Other

## 2014-02-26 DIAGNOSIS — J309 Allergic rhinitis, unspecified: Secondary | ICD-10-CM | POA: Diagnosis not present

## 2014-03-03 ENCOUNTER — Ambulatory Visit (INDEPENDENT_AMBULATORY_CARE_PROVIDER_SITE_OTHER): Payer: Medicare Other

## 2014-03-03 DIAGNOSIS — J309 Allergic rhinitis, unspecified: Secondary | ICD-10-CM

## 2014-03-10 ENCOUNTER — Ambulatory Visit (INDEPENDENT_AMBULATORY_CARE_PROVIDER_SITE_OTHER): Payer: Medicare Other

## 2014-03-10 DIAGNOSIS — J309 Allergic rhinitis, unspecified: Secondary | ICD-10-CM

## 2014-03-17 ENCOUNTER — Ambulatory Visit (INDEPENDENT_AMBULATORY_CARE_PROVIDER_SITE_OTHER): Payer: Medicare Other

## 2014-03-17 ENCOUNTER — Ambulatory Visit (INDEPENDENT_AMBULATORY_CARE_PROVIDER_SITE_OTHER): Payer: Medicare Other | Admitting: Internal Medicine

## 2014-03-17 ENCOUNTER — Encounter: Payer: Self-pay | Admitting: Internal Medicine

## 2014-03-17 VITALS — BP 114/60 | HR 84 | Ht 62.0 in | Wt 140.2 lb

## 2014-03-17 DIAGNOSIS — J45998 Other asthma: Secondary | ICD-10-CM

## 2014-03-17 DIAGNOSIS — J309 Allergic rhinitis, unspecified: Secondary | ICD-10-CM | POA: Diagnosis not present

## 2014-03-17 DIAGNOSIS — J301 Allergic rhinitis due to pollen: Secondary | ICD-10-CM | POA: Diagnosis not present

## 2014-03-17 DIAGNOSIS — Z23 Encounter for immunization: Secondary | ICD-10-CM

## 2014-03-17 DIAGNOSIS — J45909 Unspecified asthma, uncomplicated: Secondary | ICD-10-CM | POA: Diagnosis not present

## 2014-03-17 MED ORDER — MONTELUKAST SODIUM 10 MG PO TABS: ORAL_TABLET | ORAL | Status: DC

## 2014-03-17 MED ORDER — ALBUTEROL SULFATE HFA 108 (90 BASE) MCG/ACT IN AERS: 2.0000 | INHALATION_SPRAY | Freq: Four times a day (QID) | RESPIRATORY_TRACT | Status: DC | PRN

## 2014-03-17 NOTE — Progress Notes (Signed)
Patient ID: Samantha [name], female    DOB: Sep 07, 1946, 68 y.o.   MRN: [id number]  HPI 01/15/11- 65 yoF never smoker,  Followed for allergic rhinitis and ashtma with GERD.  Last here September 05, 2010. Did well since last here, but was dx'd with fibromyalgia/ Dr Patrecia Pour,  then suspected statin induced muscle pains.  Says breathing has been good. Needed to use rescue inhaler twice for some tightness after being out in wind/ pollen. Continues allergy vaccine, reporting occasional local reaction at 1:50.  05/24/11- 73 yoF never smoker,  Followed for allergic rhinitis and ashtma with GERD. 5 weeks head and throat congestion with bad sore throat. [name] to Dr Loanne Drilling- treated doxy as sinusitis- finished x1 week ago. Marland Kitchen Has had laryngitis and tussive right lateral rib pains x 2 days. Denies change in chronic fibromyalgia pains,  She has to travel a long way to get here from [place] and is interested if we can get her allergy shots given through the [place] office. We discussed antibiotic choices.  07/16/11- 72 yoF never smoker,  Followed for allergic rhinitis and asthma with GERD. She is feeling well. We were not able to work out an arrangement for her to get her allergy shots in [place]. She feels allergy control is much better on vaccine but does get some local soreness at the injection sites, which are rotated. Her GERD has been active.  01/14/12- 68 yoF never smoker,  Followed for allergic rhinitis and asthma with GERD. Breathing doing great; no asthma attacks; recently had flare up of sinus infection; still on vaccine and doing well On only one day she needed rescue inhaler after smoke exposure. We had called in an antibiotic for sinusitis-worked well. Continues allergy vaccine at 1:50 with injections here. History of Raynaud's phenomenon. Has to wear gloves indoors in the summer. Asked-allow a [employer] form.  05/06/12- 65 yoF never smoker,  Followed for allergic rhinitis and asthma  with GERD. ACUTE: both ears having pressure and swollen neck area on Right side; increased sinus/throat congestion Onset of a cold one week ago. Feels weak has laryngitis some chills but no discolored sputum. Headache improved. Nasal congestion. Plan seem swollen. Chest little tight. Using her rescue inhaler. She says otherwise she had been doing better on allergy vaccine at 1:50 [place].  06/23/12- 65 yoF never smoker,  Followed for allergic rhinitis and asthma with GERD. Still on vaccine 1:50 GH and doing well it; denies any itchy, watery eyes or sneezing at this time. Occasional minor nasal and chest congestion which she can control. Occasional right ear pains without change in hearing. Pending right knee arthroscopy  10/28/12-  66 yoF never smoker,  Followed for allergic rhinitis and asthma with GERD FOLLOWS FOR: still on vaccine; states its too hard to get here every week to get shots. Recent upper respiratory infection resolved with Mucinex. Occasional mild shortness of breath-his rescue inhaler successfully maybe 2 or 3 times a month with no real pattern. Allergy vaccine had been at 1:50 she has become come very irregular because logistics are inconvenient. We discussed stopping her vaccine now after 3 years, but she may decide to finish her current supply and continue through the spring pollen season  03/17/13-66 yoF never smoker,  Followed for allergic rhinitis and asthma with GERD FOLLOWS FOR: 4 month follow up - reports has been doing well.  did have some allergy symptoms 2 weeks ago with outdoor activity but nasal spray has helped.  allergy  injection has "really helped" After one hour and an outdoor park she experienced nasal congestion, ear ache, but quite nasal discharge. She says that is not a cold. Using Nasalcrom. Wants to stay on allergy vaccine at  05/19/13-66 yoF never smoker,  Followed for allergic rhinitis and asthma with GERD ACUTE VISIT: has not taken allergy injection x 2 weeks;  recently had walking PNA-follow up from this.  3 weeks ago developed increased sinus congestion treated by her primary physician with Augmentin ending 3 or 4 days ago.  Continues allergy vaccine 1:10 GH  09/24/13- 89 yoF never smoker,  Followed for allergic rhinitis and asthma with GERD Follows For: Sinus and head congestion - PND - Ears feel full - Occas prod cough (clear) - Chest tightness Continues allergy vaccine 1:10 GH ACUTE VISIT: sore throat, chest and nasal congestion; cough-productive-thick and white to pale yellow in color.  We sent biaxin 1/15- helped. Got worse again this weekend with itching eyes, sore throat, head and chest congestion, temperature 99.5. Sinuses are clearing using Mucinex. CXR 09/24/13 IMPRESSION:  No active cardiopulmonary disease.  Electronically Signed  By: Kathreen Devoid  On: 09/24/2013 14:54  03/17/14- 67 yoF never smoker,  Followed for allergic rhinitis and asthma with GERD FOLLOWS FOR: Still on Allergy vaccine 1:10 GH and doing well; having nasal congestion. Minor nasal congestion but feels she is doing well with allergy vaccine. Occasional light cough but no recent wheezing. She drives here from [place] to get her shots and says her drugstore as indicated they might be able to give her allergy shots. She asks about getting Prevnar pneumonia vaccination. We reviewed her history and discussed this.  Review of Systems-see HPI Constitutional:   No weight loss, night sweats,  Fevers, chills, fatigue, lassitude. HEENT:   No headaches,  Difficulty swallowing,  Tooth/dental problems,  Sore throat,                No sneezing, itching, or +ear ache,   +nasal congestion, post nasal drip, CV:  No- chest pain, orthopnea, PND, swelling in lower extremities, anasarca, dizziness, palpitations GI  No heartburn, indigestion, abdominal pain, nausea, vomiting,  Resp: No shortness of breath with exertion or at rest.  No excess mucus, no productive cough,  No                   No-non-productive cough,  No coughing up of blood.  No change in color of mucus.  +infrequent               wheezing.   Skin: no rash or lesions. GU:  MS:  No joint pain or swelling.   Psych:  No change in mood or affect. No depression or anxiety.  No memory loss.    Objective:   Physical Exam General- Alert, Oriented, Affect-appropriate, Distress- none acute; pleasant and talkative; well appearing Skin- rash-none, lesions- none, excoriation- none Lymphadenopathy- none Head- atraumatic            Eyes- Gross vision intact, PERRLA, conjunctivae clear secretions, + periorbital edema            Ears- +cerumen R, L clear            Nose- + turbinate edema-mild , no-Septal dev, mucus, polyps, erosion, perforation .                        Throat- Malampatti III.   TMs not  retracted , mucosa a little red ,  drainage- none, tonsils-                 atrophic;  frequent throat clearing, +hoarse Neck- flexible , trachea midline, no stridor , thyroid nl, carotid no bruit Chest - symmetrical excursion , unlabored           Heart/CV- RRR , no murmur , no gallop  , no rub, nl s1 s2                           - JVD- none , edema- none, stasis changes- none, varices- none           Lung- coarse breath sounds without distinct wheeze or rhonchi, cough+dry , dullness-none, rub- none           Chest wall-  Abd-  Br/ Gen/ Rectal- Not done, not indicated Extrem- cyanosis- none, clubbing, none, atrophy- none, strength- nl.  Neuro- grossly intact to observation

## 2014-03-17 NOTE — Patient Instructions (Addendum)
Ok to get your allergy shots every other week during times of year when you feel ok, and back to once weekly during pollen seasons.  Refill scripts sent for albuterol rescue inhaler and singulair  Order- Prevnar pneumococcal -13 vaccine

## 2014-03-21 NOTE — Assessment & Plan Note (Addendum)
Well controlled on allergy vaccine with rare need for metered inhaler Plan-refill rescue inhaler and Singulair, given Prevnar vaccination after discussion

## 2014-03-21 NOTE — Assessment & Plan Note (Signed)
Continues allergy vaccine. I'm not sure she actually talked to her pharmacist about having her drugstore give allergy shots. It might be possible if necessary. I suggested that another alternative might be to get her shot every other week but she was reluctant to change the interval

## 2014-03-24 ENCOUNTER — Ambulatory Visit (INDEPENDENT_AMBULATORY_CARE_PROVIDER_SITE_OTHER): Payer: Medicare Other

## 2014-03-24 DIAGNOSIS — J309 Allergic rhinitis, unspecified: Secondary | ICD-10-CM | POA: Diagnosis not present

## 2014-03-31 ENCOUNTER — Ambulatory Visit: Payer: Medicare Other

## 2014-04-07 ENCOUNTER — Ambulatory Visit (INDEPENDENT_AMBULATORY_CARE_PROVIDER_SITE_OTHER): Payer: Medicare Other

## 2014-04-07 DIAGNOSIS — J309 Allergic rhinitis, unspecified: Secondary | ICD-10-CM

## 2014-04-09 ENCOUNTER — Ambulatory Visit (INDEPENDENT_AMBULATORY_CARE_PROVIDER_SITE_OTHER): Payer: Medicare Other

## 2014-04-09 DIAGNOSIS — J309 Allergic rhinitis, unspecified: Secondary | ICD-10-CM

## 2014-04-14 ENCOUNTER — Ambulatory Visit (INDEPENDENT_AMBULATORY_CARE_PROVIDER_SITE_OTHER): Payer: Medicare Other

## 2014-04-14 DIAGNOSIS — J309 Allergic rhinitis, unspecified: Secondary | ICD-10-CM | POA: Diagnosis not present

## 2014-04-21 ENCOUNTER — Encounter: Payer: Self-pay | Admitting: Internal Medicine

## 2014-04-22 ENCOUNTER — Telehealth: Payer: Self-pay | Admitting: Internal Medicine

## 2014-04-22 ENCOUNTER — Other Ambulatory Visit: Payer: Self-pay

## 2014-04-22 MED ORDER — OMEPRAZOLE 40 MG PO CPDR: 40.0000 mg | DELAYED_RELEASE_CAPSULE | Freq: Every day | ORAL | Status: DC

## 2014-04-22 MED ORDER — MONTELUKAST SODIUM 10 MG PO TABS: ORAL_TABLET | ORAL | Status: DC

## 2014-04-22 MED ORDER — LOSARTAN POTASSIUM-HCTZ 100-12.5 MG PO TABS: 1.0000 | ORAL_TABLET | Freq: Every day | ORAL | Status: DC

## 2014-04-22 MED ORDER — CETIRIZINE HCL 10 MG PO TABS: 10.0000 mg | ORAL_TABLET | Freq: Every day | ORAL | Status: DC

## 2014-04-22 NOTE — Telephone Encounter (Signed)
Pt aware RX's called into meds by [facility]. Nothing further needed

## 2014-04-22 NOTE — Telephone Encounter (Signed)
Pt request refill losartan hctz and omeprazole to [facility]; pt has CPX scheduled for 06/02/14.advised pt refills done.

## 2014-04-28 ENCOUNTER — Ambulatory Visit (INDEPENDENT_AMBULATORY_CARE_PROVIDER_SITE_OTHER): Payer: Medicare Other

## 2014-04-28 DIAGNOSIS — J309 Allergic rhinitis, unspecified: Secondary | ICD-10-CM | POA: Diagnosis not present

## 2014-05-05 ENCOUNTER — Ambulatory Visit (INDEPENDENT_AMBULATORY_CARE_PROVIDER_SITE_OTHER): Payer: Medicare Other

## 2014-05-05 DIAGNOSIS — J309 Allergic rhinitis, unspecified: Secondary | ICD-10-CM

## 2014-05-10 ENCOUNTER — Ambulatory Visit (INDEPENDENT_AMBULATORY_CARE_PROVIDER_SITE_OTHER): Payer: Medicare Other

## 2014-05-10 DIAGNOSIS — J309 Allergic rhinitis, unspecified: Secondary | ICD-10-CM | POA: Diagnosis not present

## 2014-05-12 ENCOUNTER — Ambulatory Visit: Payer: Medicare Other

## 2014-05-19 ENCOUNTER — Ambulatory Visit (INDEPENDENT_AMBULATORY_CARE_PROVIDER_SITE_OTHER): Payer: Medicare Other

## 2014-05-19 DIAGNOSIS — J309 Allergic rhinitis, unspecified: Secondary | ICD-10-CM | POA: Diagnosis not present

## 2014-05-19 DIAGNOSIS — Z23 Encounter for immunization: Secondary | ICD-10-CM

## 2014-05-24 ENCOUNTER — Other Ambulatory Visit: Payer: Medicare Other

## 2014-05-24 ENCOUNTER — Ambulatory Visit (INDEPENDENT_AMBULATORY_CARE_PROVIDER_SITE_OTHER): Payer: Medicare Other

## 2014-05-24 DIAGNOSIS — M25559 Pain in unspecified hip: Secondary | ICD-10-CM | POA: Diagnosis not present

## 2014-05-24 DIAGNOSIS — G4700 Insomnia, unspecified: Secondary | ICD-10-CM | POA: Diagnosis not present

## 2014-05-24 DIAGNOSIS — J309 Allergic rhinitis, unspecified: Secondary | ICD-10-CM | POA: Diagnosis not present

## 2014-05-24 DIAGNOSIS — M5030 Other cervical disc degeneration, unspecified cervical region: Secondary | ICD-10-CM | POA: Diagnosis not present

## 2014-05-24 DIAGNOSIS — IMO0001 Reserved for inherently not codable concepts without codable children: Secondary | ICD-10-CM | POA: Diagnosis not present

## 2014-05-26 ENCOUNTER — Other Ambulatory Visit (INDEPENDENT_AMBULATORY_CARE_PROVIDER_SITE_OTHER): Payer: Medicare Other

## 2014-05-26 ENCOUNTER — Other Ambulatory Visit: Payer: Self-pay | Admitting: Family Medicine

## 2014-05-26 DIAGNOSIS — I10 Essential (primary) hypertension: Secondary | ICD-10-CM | POA: Diagnosis not present

## 2014-05-26 DIAGNOSIS — E7800 Pure hypercholesterolemia, unspecified: Secondary | ICD-10-CM | POA: Diagnosis not present

## 2014-05-26 DIAGNOSIS — Z0000 Encounter for general adult medical examination without abnormal findings: Secondary | ICD-10-CM

## 2014-05-26 LAB — CBC WITH DIFFERENTIAL/PLATELET
Basophils Absolute: 0 10*3/uL (ref 0.0–0.1)
Basophils Relative: 0.5 % (ref 0.0–3.0)
Eosinophils Absolute: 0.1 10*3/uL (ref 0.0–0.7)
Eosinophils Relative: 1.4 % (ref 0.0–5.0)
HCT: 38.5 % (ref 36.0–46.0)
Hemoglobin: 12.8 g/dL (ref 12.0–15.0)
Lymphocytes Relative: 45 % (ref 12.0–46.0)
Lymphs Abs: 2.1 10*3/uL (ref 0.7–4.0)
MCHC: 33.2 g/dL (ref 30.0–36.0)
MCV: 87.2 fl (ref 78.0–100.0)
Monocytes Absolute: 0.3 10*3/uL (ref 0.1–1.0)
Monocytes Relative: 7.1 % (ref 3.0–12.0)
Neutro Abs: 2.1 10*3/uL (ref 1.4–7.7)
Neutrophils Relative %: 46 % (ref 43.0–77.0)
Platelets: 199 10*3/uL (ref 150.0–400.0)
RBC: 4.42 Mil/uL (ref 3.87–5.11)
RDW: 14.3 % (ref 11.5–15.5)
WBC: 4.6 10*3/uL (ref 4.0–10.5)

## 2014-05-27 LAB — COMPREHENSIVE METABOLIC PANEL
ALT: 11 U/L (ref 0–35)
AST: 23 U/L (ref 0–37)
Albumin: 4.4 g/dL (ref 3.5–5.2)
Alkaline Phosphatase: 64 U/L (ref 39–117)
BUN: 16 mg/dL (ref 6–23)
CO2: 27 mEq/L (ref 19–32)
Calcium: 9.6 mg/dL (ref 8.4–10.5)
Chloride: 103 mEq/L (ref 96–112)
Creatinine, Ser: 0.9 mg/dL (ref 0.4–1.2)
GFR: 75.98 mL/min (ref >60.00)
Glucose, Bld: 98 mg/dL (ref 70–99)
Potassium: 3.8 mEq/L (ref 3.5–5.1)
Sodium: 138 mEq/L (ref 135–145)
Total Bilirubin: 0.4 mg/dL (ref 0.2–1.2)
Total Protein: 7.9 g/dL (ref 6.0–8.3)

## 2014-05-27 LAB — LIPID PANEL
Cholesterol: 207 mg/dL — ABNORMAL HIGH (ref 0–200)
HDL: 85.1 mg/dL (ref >39.00)
LDL Cholesterol: 102 mg/dL — ABNORMAL HIGH (ref 0–99)
NonHDL: 121.9
Total CHOL/HDL Ratio: 2
Triglycerides: 99 mg/dL (ref 0.0–149.0)
VLDL: 19.8 mg/dL (ref 0.0–40.0)

## 2014-05-27 LAB — TSH: TSH: 1.8 u[IU]/mL (ref 0.35–4.50)

## 2014-05-28 DIAGNOSIS — H40009 Preglaucoma, unspecified, unspecified eye: Secondary | ICD-10-CM | POA: Diagnosis not present

## 2014-05-31 DIAGNOSIS — Z23 Encounter for immunization: Secondary | ICD-10-CM | POA: Diagnosis not present

## 2014-06-01 ENCOUNTER — Encounter: Payer: Medicare Other | Admitting: Family Medicine

## 2014-06-02 ENCOUNTER — Ambulatory Visit (INDEPENDENT_AMBULATORY_CARE_PROVIDER_SITE_OTHER): Payer: Medicare Other

## 2014-06-02 ENCOUNTER — Ambulatory Visit (INDEPENDENT_AMBULATORY_CARE_PROVIDER_SITE_OTHER): Payer: Medicare Other | Admitting: Family Medicine

## 2014-06-02 ENCOUNTER — Encounter: Payer: Self-pay | Admitting: Family Medicine

## 2014-06-02 VITALS — BP 130/68 | HR 76 | Temp 98.0°F | Ht 62.0 in | Wt 139.2 lb

## 2014-06-02 DIAGNOSIS — Z860100 Personal history of colon polyps, unspecified: Secondary | ICD-10-CM

## 2014-06-02 DIAGNOSIS — J309 Allergic rhinitis, unspecified: Secondary | ICD-10-CM | POA: Diagnosis not present

## 2014-06-02 DIAGNOSIS — H409 Unspecified glaucoma: Secondary | ICD-10-CM

## 2014-06-02 DIAGNOSIS — IMO0001 Reserved for inherently not codable concepts without codable children: Secondary | ICD-10-CM

## 2014-06-02 DIAGNOSIS — M810 Age-related osteoporosis without current pathological fracture: Secondary | ICD-10-CM

## 2014-06-02 DIAGNOSIS — M1990 Unspecified osteoarthritis, unspecified site: Secondary | ICD-10-CM | POA: Diagnosis not present

## 2014-06-02 DIAGNOSIS — Z0000 Encounter for general adult medical examination without abnormal findings: Secondary | ICD-10-CM

## 2014-06-02 DIAGNOSIS — H40009 Preglaucoma, unspecified, unspecified eye: Secondary | ICD-10-CM

## 2014-06-02 DIAGNOSIS — H40059 Ocular hypertension, unspecified eye: Secondary | ICD-10-CM | POA: Insufficient documentation

## 2014-06-02 DIAGNOSIS — Z1211 Encounter for screening for malignant neoplasm of colon: Secondary | ICD-10-CM

## 2014-06-02 DIAGNOSIS — H40053 Ocular hypertension, bilateral: Secondary | ICD-10-CM

## 2014-06-02 DIAGNOSIS — M797 Fibromyalgia: Secondary | ICD-10-CM

## 2014-06-02 MED ORDER — CETIRIZINE HCL 10 MG PO TABS: 10.0000 mg | ORAL_TABLET | Freq: Every day | ORAL | Status: DC

## 2014-06-02 MED ORDER — OMEPRAZOLE 40 MG PO CPDR: 40.0000 mg | DELAYED_RELEASE_CAPSULE | Freq: Every day | ORAL | Status: DC

## 2014-06-02 NOTE — Assessment & Plan Note (Signed)
Intermittent issue- wakes her up at night at times. Followed by Dr. Estanislado Pandy.

## 2014-06-02 NOTE — Assessment & Plan Note (Signed)
- 

## 2014-06-02 NOTE — Assessment & Plan Note (Signed)
Followed by optho

## 2014-06-02 NOTE — Progress Notes (Signed)
Pre visit review using our clinic review tool, if applicable. No additional management support is needed unless otherwise documented below in the visit note. 

## 2014-06-02 NOTE — Progress Notes (Signed)
Subjective:    Patient ID: Samantha [name], female    DOB: 09-Sep-1946, 68 y.o.   MRN: [id number]  HPI Very pleasant 68 yo female with h/o HTN, allergic rhinitis, GERD, asthma, fibromyalgia, here for annual Medicare Wellness Visit.  I have personally reviewed the Medicare Annual Wellness questionnaire and have noted 1. The patient's medical and social history 2. Their use of alcohol, tobacco or illicit drugs 3. Their current medications and supplements 4. The patient's functional ability including ADL's, fall risks, home safety risks and hearing or visual             impairment. 5. Diet and physical activities 6. Evidence for depression or mood disorders  End of life wishes discussed and updated in Social History.  The roster of all physicians providing medical care to patient - is listed in the Snapshot section of the chart.   Td 11/09/02 Tdap 07/03/13 Zoster 06/17/13 Pneumovax 11/23/09 Prevnar 13- 03/17/14 Influenza vaccine 05/31/14 Colonoscopy 04/21/10- [facility] 10/21/13 DEXA 06/28/13 Saw GYN- Dr. Radene Knee 01/10/14 LMP in 1998. No h/o post menopausal bleeding.  Eye exam 05/28/14  Asthma - sees Dr. Annamaria Boots.  Las saw him on 03/17/14- note reviewed. Advised to continue current inhalers and singulair. Also receiving allergy shots.  Fibromyalgia- followed by Dr. Estanislado Pandy.  Last saw her last week.  She added Turmeric, Ginger, tart cherry, Omega 3.    Had to cut back on calcium due to kidney stones.  She feels these supplements are already helping.  Lab Results  Component Value Date   CREATININE 0.9 05/26/2014   Lab Results  Component Value Date   CHOL 207* 05/26/2014   HDL 85.10 05/26/2014   LDLCALC 102* 05/26/2014   TRIG 99.0 05/26/2014   CHOLHDL 2 05/26/2014   Lab Results  Component Value Date   WBC 4.6 05/26/2014   HGB 12.8 05/26/2014   HCT 38.5 05/26/2014   MCV 87.2 05/26/2014   PLT 199.0 05/26/2014   Lab Results  Component Value Date   TSH 1.80 05/26/2014    Patient Active  Problem List   Diagnosis Date Noted  . Encounter for Medicare annual wellness exam 05/13/2013  . Fibromyalgia 02/26/2013  . Allergic rhinitis due to pollen 11/20/2010  . 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 IRRITABLE BOWEL SYNDROME 04/06/2010  . THYROID NODULE, RIGHT 12/07/2009  . URINARY CALCULUS 11/28/2009  . OSTEOARTHRITIS 11/28/2009  . HYPERGLYCEMIA 11/28/2009  . FIBROIDS, UTERUS 05/10/2008  . INSOMNIA 05/10/2008  . ASYMPTOMATIC POSTMENOPAUSAL STATUS 05/10/2008  . HYPERTENSION 01/28/2008  . HYPERCHOLESTEROLEMIA 01/07/2008  . GLAUCOMA 01/07/2008  . 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 GERD 04/14/2007  . OSTEOPOROSIS 04/14/2007  . COLONIC POLYPS, HX OF 04/14/2007   Past Medical History  Diagnosis Date  . ASTHMA 04/14/2007  . HYPERCHOLESTEROLEMIA 01/07/2008  . HYPERTENSION 01/28/2008  . SINUSITIS 02/10/2009  . ALLERGIC RHINITIS 07/26/2008  . INSOMNIA 05/10/2008  . FIBROIDS, UTERUS 05/10/2008  . OSTEOPOROSIS 04/14/2007  . OSTEOARTHRITIS 11/28/2009  . GERD 04/14/2007  . Irritable bowel syndrome 04/06/2010  . CHEST PAIN 08/26/2008  . HYPERGLYCEMIA 11/28/2009  . 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 ASYMPTOMATIC POSTMENOPAUSAL STATUS 05/10/2008  . 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 Episcleritis   . Fibromyalgia    Past Surgical History  Procedure Laterality Date  . Nasal polyp surgery      Polypectomy and septoplasty  . Laparoscopy    . Neck injury  1983  . Stress cardiolite  02/13/2002  . Electrocardiogram  12/04/2006  .  Colonoscopy w/ polypectomy     History  Substance Use Topics  . Smoking status: Never Smoker   . Smokeless tobacco: Never Used  . Alcohol Use: No   Family History  Problem Relation Age of Onset  . Heart disease Father     CHF  . Cancer Neg Hx     No FH of Colon Cancer   Allergies  Allergen Reactions  . Cefuroxime Axetil     REACTION: pt not sure  . Sulfonamide Derivatives     REACTION: pt not sure   Current Outpatient Prescriptions  on File Prior to Visit  Medication Sig Dispense Refill  . albuterol (PROVENTIL HFA) 108 (90 BASE) MCG/ACT inhaler Inhale 2 puffs into the lungs every 6 (six) hours as needed.  3 Inhaler  3  . Ascorbic Acid (VITAMIN C) 500 MG tablet Take 500 mg by mouth daily.        . bifidobacterium infantis (ALIGN) capsule Take 1 capsule by mouth daily.        Marland Kitchen CALCIUM CITRATE PO Take 1 capsule by mouth daily. 600 mg      . Cholecalciferol (VITAMIN D3) 400 UNITS tablet Take 400 Units by mouth 2 (two) times daily with a meal.        . Coenzyme Q-10 60 MG CAPS Take 1 capsule by mouth 2 (two) times daily.       . cromolyn (NASALCROM) 5.2 MG/ACT nasal spray 1-2 sprays each nostril four times a day as needed       . Cyanocobalamin (VITAMIN B-12) 2500 MCG SUBL Place 1 tablet under the tongue daily. Contains Folic Acid, B6, and Biotin      . guaiFENesin (MUCINEX) 600 MG 12 hr tablet Take 1,200 mg by mouth 2 (two) times daily.      Marland Kitchen losartan-hydrochlorothiazide (HYZAAR) 100-12.5 MG per tablet Take 1 tablet by mouth daily.  90 tablet  0  . Magnesium Malate POWD (1250mg  tabs) 1 tablet by mouth two times a day      . Manganese Gluconate 50 MG TABS Take 1 tablet by mouth daily.        . montelukast (SINGULAIR) 10 MG tablet 1 daily  90 tablet  3  . Omega-3 Fatty Acids (OMEGA 3 PO) Take one tablet daily      . thiamine 100 MG tablet Take 100 mg by mouth daily.         No current facility-administered medications on file prior to visit.   The PMH, PSH, Social History, Family History, Medications, and allergies have been reviewed in [facility], and have been updated if relevant.   Review of Systems    See HPI No CP or SOB No blood in stool or changes in bowel habits No anxiety or depression +joint pain +UE radiculopathy- per pt, Dr. Estanislado Pandy is referring her to neurology for further work up. Objective:   Physical Exam  BP 130/68  Pulse 76  Temp(Src) 98 F (36.7 C) (Oral)  Ht 5\' 2"  (1.575 m)  Wt 139 lb 4 oz  (63.163 kg)  BMI 25.46 kg/m2  SpO2 98%  General:  Well-developed,well-nourished,in no acute distress; alert,appropriate and cooperative throughout examination Head:  normocephalic and atraumatic.   Eyes:  vision grossly intact, pupils equal, pupils round, and pupils reactive to light.   Ears:  R ear normal and L ear normal.   Nose:  no external deformity.   Mouth:  good dentition.   Neck:  No deformities, masses, or tenderness noted.  Lungs:  Normal respiratory effort, chest expands symmetrically. Lungs are clear to auscultation, no crackles or wheezes. Heart:  Normal rate and regular rhythm. S1 and S2 normal without gallop, murmur, click, rub or other extra sounds. Abdomen:  Bowel sounds positive,abdomen soft and non-tender without masses, organomegaly or hernias noted. Msk:  No deformity or scoliosis noted of thoracic or lumbar spine.   Extremities:  No clubbing, cyanosis, edema, or deformity noted with normal full range of motion of all joints.   Neurologic:  alert & oriented X3 and gait normal.   Skin:  Intact without suspicious lesions or rashes Cervical Nodes:  No lymphadenopathy noted Axillary Nodes:  No palpable lymphadenopathy Psych:  Cognition and judgment appear intact. Alert and cooperative with normal attention span and concentration. No apparent delusions, illusions, hallucinations     Assessment & Plan:

## 2014-06-02 NOTE — Patient Instructions (Signed)
Td 11/09/02 Tdap 07/03/13 Zoster 06/17/13 Pneumovax 11/23/09 Prevnar 13- 03/17/14 Influenza vaccine 05/31/14 Colonoscopy 04/21/10- [facility] exam 05/28/14   DEXA 06/28/13 Saw GYN- Dr. Radene Knee 01/10/14  Great to see you.

## 2014-06-02 NOTE — Assessment & Plan Note (Signed)
Symptoms improving with supplements suggested by Dr. Estanislado Pandy.

## 2014-06-02 NOTE — Assessment & Plan Note (Signed)
Reviewed preventive care protocols, scheduled due services, and updated immunizations Discussed nutrition, exercise, diet, and healthy lifestyle.  

## 2014-06-07 ENCOUNTER — Ambulatory Visit (INDEPENDENT_AMBULATORY_CARE_PROVIDER_SITE_OTHER): Payer: Medicare Other

## 2014-06-07 DIAGNOSIS — M79609 Pain in unspecified limb: Secondary | ICD-10-CM | POA: Diagnosis not present

## 2014-06-07 DIAGNOSIS — J309 Allergic rhinitis, unspecified: Secondary | ICD-10-CM | POA: Diagnosis not present

## 2014-06-07 DIAGNOSIS — R209 Unspecified disturbances of skin sensation: Secondary | ICD-10-CM | POA: Diagnosis not present

## 2014-06-08 ENCOUNTER — Encounter: Payer: Self-pay | Admitting: Gastroenterology

## 2014-06-16 ENCOUNTER — Ambulatory Visit (INDEPENDENT_AMBULATORY_CARE_PROVIDER_SITE_OTHER): Payer: Medicare Other

## 2014-06-16 DIAGNOSIS — J309 Allergic rhinitis, unspecified: Secondary | ICD-10-CM | POA: Diagnosis not present

## 2014-06-21 ENCOUNTER — Encounter: Payer: Self-pay | Admitting: Family Medicine

## 2014-06-21 ENCOUNTER — Ambulatory Visit (INDEPENDENT_AMBULATORY_CARE_PROVIDER_SITE_OTHER): Payer: Medicare Other | Admitting: Family Medicine

## 2014-06-21 VITALS — BP 118/70 | HR 72 | Temp 98.1°F | Wt 138.5 lb

## 2014-06-21 DIAGNOSIS — J069 Acute upper respiratory infection, unspecified: Secondary | ICD-10-CM | POA: Diagnosis not present

## 2014-06-21 MED ORDER — AMOXICILLIN-POT CLAVULANATE 875-125 MG PO TABS: 1.0000 | ORAL_TABLET | Freq: Two times a day (BID) | ORAL | Status: AC

## 2014-06-21 NOTE — Progress Notes (Signed)
SUBJECTIVE:  Samantha [name] is a 68 y.o. female who complains of coryza, congestion, sneezing, sore throat and wheezes for 4 days. She denies a history of shortness of breath and vomiting and admits to a history of asthma. Patient denies smoke cigarettes.   Current Outpatient Prescriptions on File Prior to Visit  Medication Sig Dispense Refill  . albuterol (PROVENTIL HFA) 108 (90 BASE) MCG/ACT inhaler Inhale 2 puffs into the lungs every 6 (six) hours as needed.  3 Inhaler  3  . Ascorbic Acid (VITAMIN C) 500 MG tablet Take 500 mg by mouth daily.        . bifidobacterium infantis (ALIGN) capsule Take 1 capsule by mouth daily.        Marland Kitchen CALCIUM CITRATE PO Take 1 capsule by mouth daily. 600 mg      . cetirizine (ZYRTEC) 10 MG tablet Take 1 tablet (10 mg total) by mouth daily.  90 tablet  2  . Cholecalciferol (VITAMIN D3) 400 UNITS tablet Take 400 Units by mouth 2 (two) times daily with a meal.        . Coenzyme Q-10 60 MG CAPS Take 1 capsule by mouth 2 (two) times daily.       . cromolyn (NASALCROM) 5.2 MG/ACT nasal spray 1-2 sprays each nostril four times a day as needed       . Cyanocobalamin (VITAMIN B-12) 2500 MCG SUBL Place 1 tablet under the tongue daily. Contains Folic Acid, B6, and Biotin      . Ginger, Zingiber officinalis, (GINGER PO) Take by mouth.      Marland Kitchen guaiFENesin (MUCINEX) 600 MG 12 hr tablet Take 1,200 mg by mouth 2 (two) times daily.      Marland Kitchen losartan-hydrochlorothiazide (HYZAAR) 100-12.5 MG per tablet Take 1 tablet by mouth daily.  90 tablet  0  . Magnesium Malate POWD (1250mg  tabs) 1 tablet by mouth two times a day      . Manganese Gluconate 50 MG TABS Take 1 tablet by mouth daily.        . Misc Natural Products (TART CHERRY ADVANCED PO) Take by mouth.      . montelukast (SINGULAIR) 10 MG tablet 1 daily  90 tablet  3  . Omega-3 Fatty Acids (OMEGA 3 PO) Take one tablet daily      . omeprazole (PRILOSEC) 40 MG capsule Take 1 capsule (40 mg total) by mouth daily.  90 capsule  2  .  thiamine 100 MG tablet Take 100 mg by mouth daily.        . TURMERIC PO Take by mouth daily.       No current facility-administered medications on file prior to visit.    Allergies  Allergen Reactions  . Cefuroxime Axetil     REACTION: pt not sure  . Sulfonamide Derivatives     REACTION: pt not sure    Past Medical History  Diagnosis Date  . ASTHMA 04/14/2007  . HYPERCHOLESTEROLEMIA 01/07/2008  . HYPERTENSION 01/28/2008  . SINUSITIS 02/10/2009  . ALLERGIC RHINITIS 07/26/2008  . INSOMNIA 05/10/2008  . FIBROIDS, UTERUS 05/10/2008  . OSTEOPOROSIS 04/14/2007  . OSTEOARTHRITIS 11/28/2009  . GERD 04/14/2007  . Irritable bowel syndrome 04/06/2010  . CHEST PAIN 08/26/2008  . HYPERGLYCEMIA 11/28/2009  . 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 ASYMPTOMATIC POSTMENOPAUSAL STATUS 05/10/2008  . 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 Episcleritis   . Fibromyalgia     Past Surgical History  Procedure Laterality Date  .  Nasal polyp surgery      Polypectomy and septoplasty  . Laparoscopy    . Neck injury  1983  . Stress cardiolite  02/13/2002  . Electrocardiogram  12/04/2006  . Colonoscopy w/ polypectomy      Family History  Problem Relation Age of Onset  . Heart disease Father     CHF  . Cancer Neg Hx     No FH of Colon Cancer    History   Social History  . Marital Status: Widowed    Spouse Name: N/A    Number of Children: N/A  . Years of Education: N/A   Occupational History  .      Retired Stage manager)   Social History Main Topics  . Smoking status: Never Smoker   . Smokeless tobacco: Never Used  . Alcohol Use: No  . Drug Use: No  . Sexual Activity: Not on file   Other Topics Concern  . Not on file   Social History Narrative   Lives alone (widowed).   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      Does not have living will.  Does desire CPR.  Does not want life support for prolonged periods of time if futile.   The PMH, PSH, Social History, Family  History, Medications, and allergies have been reviewed in [facility], and have been updated if relevant.  OBJECTIVE: BP 118/70  Pulse 72  Temp(Src) 98.1 F (36.7 C) (Oral)  Wt 138 lb 8 oz (62.823 kg)  SpO2 97%  She appears well, vital signs are as noted. Right TM dull, erythematous, left tm normal.  Throat and pharynx normal.  Neck supple. No adenopathy in the neck. Nose is congested. Sinuses non tender. The chest is clear, without wheezes or rales.  ASSESSMENT:  otitis media and sinusitis  PLAN: augmentin twice daily x 10 days. Symptomatic therapy suggested: push fluids, rest and return office visit prn if symptoms persist or worsen.  Call or return to clinic prn if these symptoms worsen or fail to improve as anticipated.

## 2014-06-21 NOTE — Patient Instructions (Signed)
Great to see you. Have a wonderful trip. Take Augmentin as directed, continue your nasal spray and inhalers.  Drink plenty of fluids and call us with an update later this week if you are not improving.

## 2014-06-21 NOTE — Progress Notes (Signed)
Pre visit review using our clinic review tool, if applicable. No additional management support is needed unless otherwise documented below in the visit note. 

## 2014-06-23 ENCOUNTER — Ambulatory Visit: Payer: Medicare Other

## 2014-06-24 ENCOUNTER — Telehealth: Payer: Self-pay | Admitting: Internal Medicine

## 2014-06-24 NOTE — Telephone Encounter (Signed)
Called and spoke with pt and she stated that she has been sick and she was seen by her PCP and given amoxicillin.  She stated that she started this on Monday and she is not any better.   She stated that they told her she has an ear infection and she is coughing up clear sputum, SOB, tired,  And cannot get rid of the laryngitis.  Pt is wanting to see what further recs CY advises.  Thanks.  Pt is aware that we will call her back tomorrow with CY recs.   Last ov--03/17/2014 Next ov--09/22/2014  Allergies  Allergen Reactions  . Cefuroxime Axetil     REACTION: pt not sure  . Sulfonamide Derivatives     REACTION: pt not sure    Current Outpatient Prescriptions on File Prior to Visit  Medication Sig Dispense Refill  . albuterol (PROVENTIL HFA) 108 (90 BASE) MCG/ACT inhaler Inhale 2 puffs into the lungs every 6 (six) hours as needed.  3 Inhaler  3  . amoxicillin-clavulanate (AUGMENTIN) 875-125 MG per tablet Take 1 tablet by mouth 2 (two) times daily.  20 tablet  0  . Ascorbic Acid (VITAMIN C) 500 MG tablet Take 500 mg by mouth daily.        . bifidobacterium infantis (ALIGN) capsule Take 1 capsule by mouth daily.        Marland Kitchen CALCIUM CITRATE PO Take 1 capsule by mouth daily. 600 mg      . cetirizine (ZYRTEC) 10 MG tablet Take 1 tablet (10 mg total) by mouth daily.  90 tablet  2  . Cholecalciferol (VITAMIN D3) 400 UNITS tablet Take 400 Units by mouth 2 (two) times daily with a meal.        . Coenzyme Q-10 60 MG CAPS Take 1 capsule by mouth 2 (two) times daily.       . cromolyn (NASALCROM) 5.2 MG/ACT nasal spray 1-2 sprays each nostril four times a day as needed       . Cyanocobalamin (VITAMIN B-12) 2500 MCG SUBL Place 1 tablet under the tongue daily. Contains Folic Acid, B6, and Biotin      . Ginger, Zingiber officinalis, (GINGER PO) Take by mouth.      Marland Kitchen guaiFENesin (MUCINEX) 600 MG 12 hr tablet Take 1,200 mg by mouth 2 (two) times daily.      Marland Kitchen losartan-hydrochlorothiazide (HYZAAR) 100-12.5 MG per  tablet Take 1 tablet by mouth daily.  90 tablet  0  . Magnesium Malate POWD (1250mg  tabs) 1 tablet by mouth two times a day      . Manganese Gluconate 50 MG TABS Take 1 tablet by mouth daily.        . Misc Natural Products (TART CHERRY ADVANCED PO) Take by mouth.      . montelukast (SINGULAIR) 10 MG tablet 1 daily  90 tablet  3  . Omega-3 Fatty Acids (OMEGA 3 PO) Take one tablet daily      . omeprazole (PRILOSEC) 40 MG capsule Take 1 capsule (40 mg total) by mouth daily.  90 capsule  2  . thiamine 100 MG tablet Take 100 mg by mouth daily.        . TURMERIC PO Take by mouth daily.       No current facility-administered medications on file prior to visit.

## 2014-06-25 NOTE — Telephone Encounter (Signed)
Spoke with the pt and notified of recs per CDY s She will try the mucinex D and call if needed

## 2014-06-25 NOTE — Telephone Encounter (Signed)
We can try changing antibiotic to cover a different class of infections. This may be viral or allergy, in which case antibiotics can't do anything. Ok to use Mucinex D from pharmacy.

## 2014-06-29 ENCOUNTER — Other Ambulatory Visit: Payer: Self-pay

## 2014-06-29 MED ORDER — LOSARTAN POTASSIUM-HCTZ 100-12.5 MG PO TABS: 1.0000 | ORAL_TABLET | Freq: Every day | ORAL | Status: DC

## 2014-06-29 NOTE — Telephone Encounter (Signed)
Pt request refill losartan hctz to [facility]. Advised pt done.

## 2014-06-30 ENCOUNTER — Ambulatory Visit (INDEPENDENT_AMBULATORY_CARE_PROVIDER_SITE_OTHER): Payer: Medicare Other

## 2014-06-30 DIAGNOSIS — J309 Allergic rhinitis, unspecified: Secondary | ICD-10-CM

## 2014-07-07 ENCOUNTER — Ambulatory Visit (INDEPENDENT_AMBULATORY_CARE_PROVIDER_SITE_OTHER): Payer: Medicare Other

## 2014-07-07 DIAGNOSIS — J309 Allergic rhinitis, unspecified: Secondary | ICD-10-CM | POA: Diagnosis not present

## 2014-07-12 ENCOUNTER — Ambulatory Visit (INDEPENDENT_AMBULATORY_CARE_PROVIDER_SITE_OTHER): Payer: Medicare Other

## 2014-07-12 ENCOUNTER — Encounter: Payer: Self-pay | Admitting: Internal Medicine

## 2014-07-12 DIAGNOSIS — J309 Allergic rhinitis, unspecified: Secondary | ICD-10-CM

## 2014-07-21 ENCOUNTER — Ambulatory Visit (INDEPENDENT_AMBULATORY_CARE_PROVIDER_SITE_OTHER): Payer: Medicare Other

## 2014-07-21 DIAGNOSIS — J309 Allergic rhinitis, unspecified: Secondary | ICD-10-CM

## 2014-07-21 DIAGNOSIS — G5601 Carpal tunnel syndrome, right upper limb: Secondary | ICD-10-CM | POA: Diagnosis not present

## 2014-07-23 ENCOUNTER — Encounter: Payer: Self-pay | Admitting: Internal Medicine

## 2014-07-28 ENCOUNTER — Ambulatory Visit (INDEPENDENT_AMBULATORY_CARE_PROVIDER_SITE_OTHER): Payer: Medicare Other

## 2014-07-28 DIAGNOSIS — J309 Allergic rhinitis, unspecified: Secondary | ICD-10-CM

## 2014-08-02 ENCOUNTER — Ambulatory Visit (INDEPENDENT_AMBULATORY_CARE_PROVIDER_SITE_OTHER): Payer: Medicare Other

## 2014-08-02 ENCOUNTER — Telehealth: Payer: Self-pay | Admitting: Internal Medicine

## 2014-08-02 DIAGNOSIS — J309 Allergic rhinitis, unspecified: Secondary | ICD-10-CM | POA: Diagnosis not present

## 2014-08-02 NOTE — Telephone Encounter (Signed)
Paper and message on CY's cart to complete.

## 2014-08-03 NOTE — Telephone Encounter (Signed)
Spoke with patient-she is aware that CY does not treat her for any chronic/life treating disease(s) that would warrant her to have a [employer] form completed. Per patient request I have shredded the forms and nothing more needed at this time.

## 2014-08-03 NOTE — Telephone Encounter (Signed)
I don't see that she has medically necessary electrical support equipment making her unusually dependent on [employer]. She doesn't use oxygen or etc- there is no basis for me to sign that form.

## 2014-08-11 ENCOUNTER — Ambulatory Visit: Payer: Medicare Other

## 2014-08-18 ENCOUNTER — Ambulatory Visit (INDEPENDENT_AMBULATORY_CARE_PROVIDER_SITE_OTHER): Payer: Medicare Other

## 2014-08-18 DIAGNOSIS — J309 Allergic rhinitis, unspecified: Secondary | ICD-10-CM

## 2014-08-18 DIAGNOSIS — G5601 Carpal tunnel syndrome, right upper limb: Secondary | ICD-10-CM | POA: Diagnosis not present

## 2014-08-18 DIAGNOSIS — G5602 Carpal tunnel syndrome, left upper limb: Secondary | ICD-10-CM | POA: Diagnosis not present

## 2014-08-25 ENCOUNTER — Ambulatory Visit (INDEPENDENT_AMBULATORY_CARE_PROVIDER_SITE_OTHER): Payer: Medicare Other

## 2014-08-25 DIAGNOSIS — J309 Allergic rhinitis, unspecified: Secondary | ICD-10-CM

## 2014-08-30 ENCOUNTER — Ambulatory Visit (INDEPENDENT_AMBULATORY_CARE_PROVIDER_SITE_OTHER): Payer: Medicare Other

## 2014-08-30 DIAGNOSIS — J309 Allergic rhinitis, unspecified: Secondary | ICD-10-CM

## 2014-09-01 ENCOUNTER — Ambulatory Visit: Payer: Medicare Other

## 2014-09-08 ENCOUNTER — Ambulatory Visit (INDEPENDENT_AMBULATORY_CARE_PROVIDER_SITE_OTHER): Payer: Medicare Other

## 2014-09-08 DIAGNOSIS — J309 Allergic rhinitis, unspecified: Secondary | ICD-10-CM

## 2014-09-09 ENCOUNTER — Ambulatory Visit (INDEPENDENT_AMBULATORY_CARE_PROVIDER_SITE_OTHER): Payer: Medicare Other

## 2014-09-09 DIAGNOSIS — J309 Allergic rhinitis, unspecified: Secondary | ICD-10-CM

## 2014-09-15 ENCOUNTER — Ambulatory Visit (INDEPENDENT_AMBULATORY_CARE_PROVIDER_SITE_OTHER): Payer: Medicare Other

## 2014-09-15 DIAGNOSIS — G5601 Carpal tunnel syndrome, right upper limb: Secondary | ICD-10-CM | POA: Diagnosis not present

## 2014-09-15 DIAGNOSIS — J309 Allergic rhinitis, unspecified: Secondary | ICD-10-CM

## 2014-09-22 ENCOUNTER — Ambulatory Visit (INDEPENDENT_AMBULATORY_CARE_PROVIDER_SITE_OTHER): Payer: Medicare Other

## 2014-09-22 ENCOUNTER — Encounter: Payer: Self-pay | Admitting: Internal Medicine

## 2014-09-22 ENCOUNTER — Ambulatory Visit (INDEPENDENT_AMBULATORY_CARE_PROVIDER_SITE_OTHER): Payer: Medicare Other | Admitting: Internal Medicine

## 2014-09-22 VITALS — BP 112/58 | HR 69 | Ht 62.0 in | Wt 141.8 lb

## 2014-09-22 DIAGNOSIS — J301 Allergic rhinitis due to pollen: Secondary | ICD-10-CM | POA: Diagnosis not present

## 2014-09-22 DIAGNOSIS — J45998 Other asthma: Secondary | ICD-10-CM

## 2014-09-22 DIAGNOSIS — J309 Allergic rhinitis, unspecified: Secondary | ICD-10-CM

## 2014-09-22 NOTE — Assessment & Plan Note (Signed)
We discussed potential use of a personal saline mist nebulizer for nasal use. Sudafed if needed

## 2014-09-22 NOTE — Progress Notes (Signed)
Patient ID: Samantha [name], female    DOB: 05-18-1946, 69 y.o.   MRN: [id number]  HPI 01/15/11- 41 yoF never smoker,  Followed for allergic rhinitis and ashtma with GERD.  Last here September 05, 2010. Did well since last here, but was dx'd with fibromyalgia/ Dr Patrecia Pour,  then suspected statin induced muscle pains.  Says breathing has been good. Needed to use rescue inhaler twice for some tightness after being out in wind/ pollen. Continues allergy vaccine, reporting occasional local reaction at 1:50.  05/24/11- 29 yoF never smoker,  Followed for allergic rhinitis and ashtma with GERD. 5 weeks head and throat congestion with bad sore throat. [name] to Dr Loanne Drilling- treated doxy as sinusitis- finished x1 week ago. Marland Kitchen Has had laryngitis and tussive right lateral rib pains x 2 days. Denies change in chronic fibromyalgia pains,  She has to travel a long way to get here from [place] and is interested if we can get her allergy shots given through the [place] office. We discussed antibiotic choices.  07/16/11- 66 yoF never smoker,  Followed for allergic rhinitis and asthma with GERD. She is feeling well. We were not able to work out an arrangement for her to get her allergy shots in [place]. She feels allergy control is much better on vaccine but does get some local soreness at the injection sites, which are rotated. Her GERD has been active.  01/14/12- 24 yoF never smoker,  Followed for allergic rhinitis and asthma with GERD. Breathing doing great; no asthma attacks; recently had flare up of sinus infection; still on vaccine and doing well On only one day she needed rescue inhaler after smoke exposure. We had called in an antibiotic for sinusitis-worked well. Continues allergy vaccine at 1:50 with injections here. History of Raynaud's phenomenon. Has to wear gloves indoors in the summer. Asked-allow a [employer] form.  05/06/12- 65 yoF never smoker,  Followed for allergic rhinitis and asthma  with GERD. ACUTE: both ears having pressure and swollen neck area on Right side; increased sinus/throat congestion Onset of a cold one week ago. Feels weak has laryngitis some chills but no discolored sputum. Headache improved. Nasal congestion. Plan seem swollen. Chest little tight. Using her rescue inhaler. She says otherwise she had been doing better on allergy vaccine at 1:50 [place].  06/23/12- 65 yoF never smoker,  Followed for allergic rhinitis and asthma with GERD. Still on vaccine 1:50 GH and doing well it; denies any itchy, watery eyes or sneezing at this time. Occasional minor nasal and chest congestion which she can control. Occasional right ear pains without change in hearing. Pending right knee arthroscopy  10/28/12-  66 yoF never smoker,  Followed for allergic rhinitis and asthma with GERD FOLLOWS FOR: still on vaccine; states its too hard to get here every week to get shots. Recent upper respiratory infection resolved with Mucinex. Occasional mild shortness of breath-his rescue inhaler successfully maybe 2 or 3 times a month with no real pattern. Allergy vaccine had been at 1:50 she has become come very irregular because logistics are inconvenient. We discussed stopping her vaccine now after 3 years, but she may decide to finish her current supply and continue through the spring pollen season  03/17/13-66 yoF never smoker,  Followed for allergic rhinitis and asthma with GERD FOLLOWS FOR: 4 month follow up - reports has been doing well.  did have some allergy symptoms 2 weeks ago with outdoor activity but nasal spray has helped.  allergy  injection has "really helped" After one hour and an outdoor park she experienced nasal congestion, ear ache, but quite nasal discharge. She says that is not a cold. Using Nasalcrom. Wants to stay on allergy vaccine at  05/19/13-66 yoF never smoker,  Followed for allergic rhinitis and asthma with GERD ACUTE VISIT: has not taken allergy injection x 2 weeks;  recently had walking PNA-follow up from this.  3 weeks ago developed increased sinus congestion treated by her primary physician with Augmentin ending 3 or 4 days ago.  Continues allergy vaccine 1:10 GH  09/24/13- 47 yoF never smoker,  Followed for allergic rhinitis and asthma with GERD Follows For: Sinus and head congestion - PND - Ears feel full - Occas prod cough (clear) - Chest tightness Continues allergy vaccine 1:10 GH ACUTE VISIT: sore throat, chest and nasal congestion; cough-productive-thick and white to pale yellow in color.  We sent biaxin 1/15- helped. Got worse again this weekend with itching eyes, sore throat, head and chest congestion, temperature 99.5. Sinuses are clearing using Mucinex. CXR 09/24/13 IMPRESSION:  No active cardiopulmonary disease.  Electronically Signed  By: Kathreen Devoid  On: 09/24/2013 14:54  03/17/14- 67 yoF never smoker,  Followed for allergic rhinitis and asthma with GERD FOLLOWS FOR: Still on Allergy vaccine 1:10 GH and doing well; having nasal congestion. Minor nasal congestion but feels she is doing well with allergy vaccine. Occasional light cough but no recent wheezing. She drives here from [place] to get her shots and says her drugstore as indicated they might be able to give her allergy shots. She asks about getting Prevnar pneumonia vaccination. We reviewed her history and discussed this.  1/113/16- 67 yoF never smoker,  Followed for allergic rhinitis and asthma with GERD FOLLOWS FOR: Pt states she is doing well overall with allergy injections. She did state she had a rough time in December with her breathing(? bronchitis and SOB)-getting better now. FOLLOWS FOR: Pt states she is doing well overall with allergy injections. She did state she had a rough time in December with her breathing(? bronchitis and SOB)-getting better now.   Review of Systems-see HPI Constitutional:   No weight loss, night sweats,  Fevers, chills, fatigue, lassitude. HEENT:    No headaches,  Difficulty swallowing,  Tooth/dental problems,  Sore throat,                No sneezing, itching, or +ear ache,   +nasal congestion, post nasal drip, CV:  No- chest pain, orthopnea, PND, swelling in lower extremities, anasarca, dizziness, palpitations GI  No heartburn, indigestion, abdominal pain, nausea, vomiting,  Resp: No shortness of breath with exertion or at rest.  No excess mucus, no productive cough,  No                  No-non-productive cough,  No coughing up of blood.  No change in color of mucus.  +infrequent               wheezing.   Skin: no rash or lesions. GU:  MS:  No joint pain or swelling.   Psych:  No change in mood or affect. No depression or anxiety.  No memory loss.    Objective:   Physical Exam General- Alert, Oriented, Affect-appropriate, Distress- none acute; pleasant and talkative; well appearing Skin- rash-none, lesions- none, excoriation- none Lymphadenopathy- none Head- atraumatic            Eyes- Gross vision intact, PERRLA, conjunctivae clear secretions, + periorbital edema  Ears- +cerumen R, L clear            Nose- + turbinate edema-mild , no-Septal dev, mucus, polyps, erosion, perforation .                        Throat- Malampatti III.   TMs not  retracted , mucosa a little red , drainage- none, tonsils-                 atrophic;  frequent throat clearing, +hoarse Neck- flexible , trachea midline, no stridor , thyroid nl, carotid no bruit Chest - symmetrical excursion , unlabored           Heart/CV- RRR , no murmur , no gallop  , no rub, nl s1 s2                           - JVD- none , edema- none, stasis changes- none, varices- none           Lung- coarse breath sounds without distinct wheeze or rhonchi, cough+dry , dullness-none, rub- none           Chest wall-  Abd-  Br/ Gen/ Rectal- Not done, not indicated Extrem- cyanosis- none, clubbing, none, atrophy- none, strength- nl.  Neuro- grossly intact to observation

## 2014-09-22 NOTE — Patient Instructions (Signed)
Try a heating pad on your chest  Consider trying a nasal saline mister like from [facility] - like the picture I showed you  Ok to try true Sudafed from the pharmacy counter as needed for stuffy nose. Get the regular type, not 12 or 24 hour.

## 2014-09-22 NOTE — Assessment & Plan Note (Signed)
After an acute bronchitis earlier this winter she is recovering at approaching baseline. We reviewed medication choices. She will continue present meds and report if there are problems.

## 2014-09-29 ENCOUNTER — Ambulatory Visit: Payer: Medicare Other

## 2014-10-05 ENCOUNTER — Other Ambulatory Visit: Payer: Self-pay

## 2014-10-05 DIAGNOSIS — Z1231 Encounter for screening mammogram for malignant neoplasm of breast: Secondary | ICD-10-CM

## 2014-10-06 ENCOUNTER — Ambulatory Visit (INDEPENDENT_AMBULATORY_CARE_PROVIDER_SITE_OTHER): Payer: Medicare Other

## 2014-10-06 DIAGNOSIS — M65311 Trigger thumb, right thumb: Secondary | ICD-10-CM | POA: Diagnosis not present

## 2014-10-06 DIAGNOSIS — J309 Allergic rhinitis, unspecified: Secondary | ICD-10-CM

## 2014-10-11 ENCOUNTER — Ambulatory Visit (INDEPENDENT_AMBULATORY_CARE_PROVIDER_SITE_OTHER): Payer: Medicare Other

## 2014-10-11 DIAGNOSIS — J309 Allergic rhinitis, unspecified: Secondary | ICD-10-CM | POA: Diagnosis not present

## 2014-10-13 ENCOUNTER — Ambulatory Visit: Payer: Medicare Other

## 2014-10-18 ENCOUNTER — Encounter: Payer: Self-pay | Admitting: Internal Medicine

## 2014-10-18 ENCOUNTER — Ambulatory Visit (INDEPENDENT_AMBULATORY_CARE_PROVIDER_SITE_OTHER): Payer: Medicare Other

## 2014-10-18 DIAGNOSIS — J309 Allergic rhinitis, unspecified: Secondary | ICD-10-CM | POA: Diagnosis not present

## 2014-10-20 ENCOUNTER — Ambulatory Visit: Payer: Medicare Other | Admitting: Internal Medicine

## 2014-10-25 ENCOUNTER — Ambulatory Visit: Payer: Medicare Other

## 2014-10-27 ENCOUNTER — Ambulatory Visit (INDEPENDENT_AMBULATORY_CARE_PROVIDER_SITE_OTHER): Payer: Medicare Other

## 2014-10-27 ENCOUNTER — Ambulatory Visit
Admission: RE | Admit: 2014-10-27 | Discharge: 2014-10-27 | Disposition: A | Discharge status: Home / Self Care | Payer: Medicare Other | Source: Ambulatory Visit

## 2014-10-27 ENCOUNTER — Encounter (INDEPENDENT_AMBULATORY_CARE_PROVIDER_SITE_OTHER): Payer: Self-pay

## 2014-10-27 DIAGNOSIS — J309 Allergic rhinitis, unspecified: Secondary | ICD-10-CM

## 2014-10-27 DIAGNOSIS — Z1231 Encounter for screening mammogram for malignant neoplasm of breast: Secondary | ICD-10-CM | POA: Diagnosis not present

## 2014-10-28 ENCOUNTER — Encounter: Payer: Self-pay | Admitting: *Deleted

## 2014-11-01 ENCOUNTER — Ambulatory Visit (INDEPENDENT_AMBULATORY_CARE_PROVIDER_SITE_OTHER): Payer: Medicare Other

## 2014-11-01 DIAGNOSIS — J309 Allergic rhinitis, unspecified: Secondary | ICD-10-CM | POA: Diagnosis not present

## 2014-11-03 ENCOUNTER — Ambulatory Visit: Payer: Medicare Other

## 2014-11-10 ENCOUNTER — Ambulatory Visit (INDEPENDENT_AMBULATORY_CARE_PROVIDER_SITE_OTHER): Payer: Medicare Other

## 2014-11-10 DIAGNOSIS — J309 Allergic rhinitis, unspecified: Secondary | ICD-10-CM | POA: Diagnosis not present

## 2014-11-11 ENCOUNTER — Telehealth: Payer: Self-pay

## 2014-11-11 MED ORDER — OMEPRAZOLE 40 MG PO CPDR: 40.0000 mg | DELAYED_RELEASE_CAPSULE | Freq: Every day | ORAL | Status: DC

## 2014-11-11 NOTE — Telephone Encounter (Signed)
Pt request refill omeprazole to [facility]. Advised pt should have refills available from 05/2014 rx. Pt will ck with pharmacy.

## 2014-11-11 NOTE — Telephone Encounter (Signed)
Pt called back and pharmacy did not get omeprazole refills in 05/2014. Advised pt will resend to [place].

## 2014-11-11 NOTE — Addendum Note (Signed)
Addended by: Helene Shoe on: 11/11/2014 11:06 AM   Modules accepted: Orders

## 2014-11-15 ENCOUNTER — Ambulatory Visit (INDEPENDENT_AMBULATORY_CARE_PROVIDER_SITE_OTHER): Payer: Medicare Other

## 2014-11-15 DIAGNOSIS — J309 Allergic rhinitis, unspecified: Secondary | ICD-10-CM

## 2014-11-22 ENCOUNTER — Ambulatory Visit (INDEPENDENT_AMBULATORY_CARE_PROVIDER_SITE_OTHER): Payer: Medicare Other

## 2014-11-22 DIAGNOSIS — J309 Allergic rhinitis, unspecified: Secondary | ICD-10-CM

## 2014-11-22 DIAGNOSIS — M7072 Other bursitis of hip, left hip: Secondary | ICD-10-CM | POA: Diagnosis not present

## 2014-11-22 DIAGNOSIS — M170 Bilateral primary osteoarthritis of knee: Secondary | ICD-10-CM | POA: Diagnosis not present

## 2014-11-22 DIAGNOSIS — M797 Fibromyalgia: Secondary | ICD-10-CM | POA: Diagnosis not present

## 2014-11-22 DIAGNOSIS — M19071 Primary osteoarthritis, right ankle and foot: Secondary | ICD-10-CM | POA: Diagnosis not present

## 2014-11-24 DIAGNOSIS — H40003 Preglaucoma, unspecified, bilateral: Secondary | ICD-10-CM | POA: Diagnosis not present

## 2014-11-29 DIAGNOSIS — H40003 Preglaucoma, unspecified, bilateral: Secondary | ICD-10-CM | POA: Diagnosis not present

## 2014-12-01 ENCOUNTER — Ambulatory Visit (INDEPENDENT_AMBULATORY_CARE_PROVIDER_SITE_OTHER): Payer: Medicare Other

## 2014-12-01 DIAGNOSIS — J309 Allergic rhinitis, unspecified: Secondary | ICD-10-CM

## 2014-12-06 ENCOUNTER — Ambulatory Visit (INDEPENDENT_AMBULATORY_CARE_PROVIDER_SITE_OTHER): Payer: Medicare Other

## 2014-12-06 DIAGNOSIS — J309 Allergic rhinitis, unspecified: Secondary | ICD-10-CM

## 2014-12-13 ENCOUNTER — Ambulatory Visit (INDEPENDENT_AMBULATORY_CARE_PROVIDER_SITE_OTHER): Payer: Medicare Other

## 2014-12-13 DIAGNOSIS — J309 Allergic rhinitis, unspecified: Secondary | ICD-10-CM

## 2014-12-15 ENCOUNTER — Ambulatory Visit: Payer: Medicare Other

## 2014-12-22 ENCOUNTER — Ambulatory Visit (INDEPENDENT_AMBULATORY_CARE_PROVIDER_SITE_OTHER): Payer: Medicare Other

## 2014-12-22 DIAGNOSIS — J309 Allergic rhinitis, unspecified: Secondary | ICD-10-CM | POA: Diagnosis not present

## 2014-12-27 ENCOUNTER — Ambulatory Visit: Payer: Medicare Other

## 2014-12-29 ENCOUNTER — Ambulatory Visit (INDEPENDENT_AMBULATORY_CARE_PROVIDER_SITE_OTHER): Payer: Medicare Other

## 2014-12-29 DIAGNOSIS — J309 Allergic rhinitis, unspecified: Secondary | ICD-10-CM

## 2015-01-03 ENCOUNTER — Ambulatory Visit (INDEPENDENT_AMBULATORY_CARE_PROVIDER_SITE_OTHER): Payer: Medicare Other

## 2015-01-03 DIAGNOSIS — J309 Allergic rhinitis, unspecified: Secondary | ICD-10-CM | POA: Diagnosis not present

## 2015-01-05 ENCOUNTER — Ambulatory Visit: Payer: Medicare Other

## 2015-01-06 ENCOUNTER — Ambulatory Visit (INDEPENDENT_AMBULATORY_CARE_PROVIDER_SITE_OTHER): Payer: Medicare Other

## 2015-01-06 DIAGNOSIS — J309 Allergic rhinitis, unspecified: Secondary | ICD-10-CM

## 2015-01-12 ENCOUNTER — Ambulatory Visit (INDEPENDENT_AMBULATORY_CARE_PROVIDER_SITE_OTHER): Payer: Medicare Other

## 2015-01-12 DIAGNOSIS — J309 Allergic rhinitis, unspecified: Secondary | ICD-10-CM | POA: Diagnosis not present

## 2015-01-17 ENCOUNTER — Ambulatory Visit (INDEPENDENT_AMBULATORY_CARE_PROVIDER_SITE_OTHER): Payer: Medicare Other

## 2015-01-17 DIAGNOSIS — J309 Allergic rhinitis, unspecified: Secondary | ICD-10-CM

## 2015-01-17 DIAGNOSIS — M25552 Pain in left hip: Secondary | ICD-10-CM | POA: Diagnosis not present

## 2015-01-19 ENCOUNTER — Encounter: Payer: Self-pay | Admitting: Family Medicine

## 2015-01-19 ENCOUNTER — Ambulatory Visit (INDEPENDENT_AMBULATORY_CARE_PROVIDER_SITE_OTHER): Payer: Medicare Other | Admitting: Family Medicine

## 2015-01-19 VITALS — BP 122/64 | HR 67 | Temp 98.1°F | Wt 135.0 lb

## 2015-01-19 DIAGNOSIS — J069 Acute upper respiratory infection, unspecified: Secondary | ICD-10-CM | POA: Diagnosis not present

## 2015-01-19 NOTE — Progress Notes (Signed)
SUBJECTIVE:  Samantha [name] is a 69 y.o. female who complains of congestion, sneezing, sore throat and productive cough for 2 days. She denies a history of chest pain, fatigue, myalgias, shortness of breath, sweats and vomiting and has a history of asthma. Taking nasal spray and singulair along with zycam.  Patient denies smoke cigarettes.   Current Outpatient Prescriptions on File Prior to Visit  Medication Sig Dispense Refill  . albuterol (PROVENTIL HFA) 108 (90 BASE) MCG/ACT inhaler Inhale 2 puffs into the lungs every 6 (six) hours as needed. 3 Inhaler 3  . Ascorbic Acid (VITAMIN C) 500 MG tablet Take 500 mg by mouth daily.      . bifidobacterium infantis (ALIGN) capsule Take 1 capsule by mouth daily.      Marland Kitchen CALCIUM CITRATE PO Take 1 capsule by mouth daily. 600 mg    . Cholecalciferol (VITAMIN D3) 400 UNITS tablet Take 400 Units by mouth 2 (two) times daily with a meal.      . Coenzyme Q-10 60 MG CAPS Take 1 capsule by mouth 2 (two) times daily.     . cromolyn (NASALCROM) 5.2 MG/ACT nasal spray 1-2 sprays each nostril four times a day as needed     . Cyanocobalamin (VITAMIN B-12) 2500 MCG SUBL Place 1 tablet under the tongue daily. Contains Folic Acid, B6, and Biotin    . Fexofenadine HCl (ALLEGRA PO) Take 1 capsule by mouth daily.    . Ginger, Zingiber officinalis, (GINGER PO) Take by mouth.    . losartan-hydrochlorothiazide (HYZAAR) 100-12.5 MG per tablet Take 1 tablet by mouth daily. 90 tablet 1  . Magnesium Malate POWD (1250mg  tabs) 1 tablet by mouth two times a day    . Manganese Gluconate 50 MG TABS Take 1 tablet by mouth daily.      . Misc Natural Products (TART CHERRY ADVANCED PO) Take by mouth.    . montelukast (SINGULAIR) 10 MG tablet 1 daily 90 tablet 3  . Omega-3 Fatty Acids (OMEGA 3 PO) Take one tablet daily    . omeprazole (PRILOSEC) 40 MG capsule Take 1 capsule (40 mg total) by mouth daily. 90 capsule 2  . thiamine 100 MG tablet Take 100 mg by mouth daily.      . TURMERIC  PO Take by mouth daily.     No current facility-administered medications on file prior to visit.    Allergies  Allergen Reactions  . Cefuroxime Axetil     REACTION: pt not sure  . Sulfonamide Derivatives     REACTION: pt not sure    Past Medical History  Diagnosis Date  . ASTHMA 04/14/2007  . HYPERCHOLESTEROLEMIA 01/07/2008  . HYPERTENSION 01/28/2008  . SINUSITIS 02/10/2009  . ALLERGIC RHINITIS 07/26/2008  . INSOMNIA 05/10/2008  . FIBROIDS, UTERUS 05/10/2008  . OSTEOPOROSIS 04/14/2007  . OSTEOARTHRITIS 11/28/2009  . GERD 04/14/2007  . Irritable bowel syndrome 04/06/2010  . CHEST PAIN 08/26/2008  . HYPERGLYCEMIA 11/28/2009  . 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 ASYMPTOMATIC POSTMENOPAUSAL STATUS 05/10/2008  . 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 Episcleritis   . Fibromyalgia     Past Surgical History  Procedure Laterality Date  . Nasal polyp surgery      Polypectomy and septoplasty  . Laparoscopy    . Neck injury  1983  . Stress cardiolite  02/13/2002  . Electrocardiogram  12/04/2006  . Colonoscopy w/ polypectomy      Family History  Problem Relation Age of Onset  .  Heart disease Father     CHF  . Cancer Neg Hx     No FH of Colon Cancer    History   Social History  . Marital Status: Widowed    Spouse Name: N/A  . Number of Children: N/A  . Years of Education: N/A   Occupational History  .      Retired Stage manager)   Social History Main Topics  . Smoking status: Never Smoker   . Smokeless tobacco: Never Used  . Alcohol Use: No  . Drug Use: No  . Sexual Activity: Not on file   Other Topics Concern  . Not on file   Social History Narrative   Lives alone (widowed).   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      Does not have living will.  Does desire CPR.  Does not want life support for prolonged periods of time if futile.   The PMH, PSH, Social History, Family History, Medications, and allergies have been reviewed in [facility], and have  been updated if relevant.  OBJECTIVE: BP 122/64 mmHg  Pulse 67  Temp(Src) 98.1 F (36.7 C) (Oral)  Wt 135 lb (61.236 kg)  SpO2 97%  She appears well, vital signs are as noted. Ears normal.  Throat and pharynx normal.  Neck supple. No adenopathy in the neck. Nose is congested. Sinuses non tender. The chest is clear, without wheezes or rales.  ASSESSMENT:  viral upper respiratory illness  PLAN: Symptomatic therapy suggested: push fluids, rest and return office visit prn if symptoms persist or worsen. Lack of antibiotic effectiveness discussed with her. Call or return to clinic prn if these symptoms worsen or fail to improve as anticipated.

## 2015-01-19 NOTE — Patient Instructions (Signed)
Good to see you.  This is likely a virus.  Please keep updated.  You can take Advil, Alleve, or Ibuprofen as needed (with food) for inflammation.

## 2015-01-19 NOTE — Progress Notes (Signed)
Pre visit review using our clinic review tool, if applicable. No additional management support is needed unless otherwise documented below in the visit note. 

## 2015-01-24 ENCOUNTER — Encounter: Payer: Self-pay | Admitting: Internal Medicine

## 2015-01-24 ENCOUNTER — Ambulatory Visit (INDEPENDENT_AMBULATORY_CARE_PROVIDER_SITE_OTHER): Payer: Medicare Other

## 2015-01-24 ENCOUNTER — Other Ambulatory Visit: Payer: Self-pay | Admitting: Obstetrics and Gynecology

## 2015-01-24 DIAGNOSIS — Z124 Encounter for screening for malignant neoplasm of cervix: Secondary | ICD-10-CM | POA: Diagnosis not present

## 2015-01-24 DIAGNOSIS — J309 Allergic rhinitis, unspecified: Secondary | ICD-10-CM

## 2015-01-24 DIAGNOSIS — Z6824 Body mass index (BMI) 24.0-24.9, adult: Secondary | ICD-10-CM | POA: Diagnosis not present

## 2015-01-25 LAB — CYTOLOGY - PAP

## 2015-02-09 ENCOUNTER — Ambulatory Visit (INDEPENDENT_AMBULATORY_CARE_PROVIDER_SITE_OTHER): Payer: Medicare Other

## 2015-02-09 DIAGNOSIS — J309 Allergic rhinitis, unspecified: Secondary | ICD-10-CM | POA: Diagnosis not present

## 2015-02-14 ENCOUNTER — Ambulatory Visit (INDEPENDENT_AMBULATORY_CARE_PROVIDER_SITE_OTHER): Payer: Medicare Other

## 2015-02-14 DIAGNOSIS — J309 Allergic rhinitis, unspecified: Secondary | ICD-10-CM

## 2015-02-16 ENCOUNTER — Ambulatory Visit: Payer: Medicare Other

## 2015-02-21 ENCOUNTER — Ambulatory Visit (INDEPENDENT_AMBULATORY_CARE_PROVIDER_SITE_OTHER): Payer: Medicare Other

## 2015-02-21 DIAGNOSIS — J309 Allergic rhinitis, unspecified: Secondary | ICD-10-CM | POA: Diagnosis not present

## 2015-02-23 ENCOUNTER — Ambulatory Visit: Payer: Medicare Other

## 2015-03-02 ENCOUNTER — Ambulatory Visit (INDEPENDENT_AMBULATORY_CARE_PROVIDER_SITE_OTHER): Payer: Medicare Other

## 2015-03-02 DIAGNOSIS — J309 Allergic rhinitis, unspecified: Secondary | ICD-10-CM

## 2015-03-09 ENCOUNTER — Ambulatory Visit: Payer: Medicare Other

## 2015-03-16 ENCOUNTER — Ambulatory Visit: Payer: Medicare Other

## 2015-03-23 ENCOUNTER — Ambulatory Visit (INDEPENDENT_AMBULATORY_CARE_PROVIDER_SITE_OTHER): Payer: Medicare Other

## 2015-03-23 ENCOUNTER — Ambulatory Visit: Payer: Medicare Other | Admitting: Internal Medicine

## 2015-03-23 DIAGNOSIS — J309 Allergic rhinitis, unspecified: Secondary | ICD-10-CM

## 2015-03-30 ENCOUNTER — Encounter: Payer: Self-pay | Admitting: Internal Medicine

## 2015-03-30 ENCOUNTER — Ambulatory Visit: Payer: Medicare Other | Admitting: Internal Medicine

## 2015-03-30 ENCOUNTER — Ambulatory Visit (INDEPENDENT_AMBULATORY_CARE_PROVIDER_SITE_OTHER): Payer: Medicare Other

## 2015-03-30 VITALS — BP 114/56 | HR 82 | Ht 62.0 in | Wt 135.0 lb

## 2015-03-30 DIAGNOSIS — J301 Allergic rhinitis due to pollen: Secondary | ICD-10-CM

## 2015-03-30 DIAGNOSIS — J309 Allergic rhinitis, unspecified: Secondary | ICD-10-CM

## 2015-03-30 DIAGNOSIS — J45998 Other asthma: Secondary | ICD-10-CM

## 2015-03-30 MED ORDER — METHYLPREDNISOLONE ACETATE 80 MG/ML IJ SUSP
80.0000 mg | Freq: Once | INTRAMUSCULAR | Status: AC
  Administered 2015-03-30: 80 mg via INTRAMUSCULAR

## 2015-03-30 MED ORDER — PHENYLEPHRINE HCL 1 % NA SOLN
3.0000 [drp] | Freq: Once | NASAL | Status: AC
  Administered 2015-03-30: 3 [drp] via NASAL

## 2015-03-30 NOTE — Progress Notes (Signed)
Patient ID: Samantha [name], female    DOB: Sep 07, 1946, 69 y.o.   MRN: [id number]  HPI 01/15/11- 65 yoF never smoker,  Followed for allergic rhinitis and ashtma with GERD.  Last here September 05, 2010. Did well since last here, but was dx'd with fibromyalgia/ Dr Patrecia Pour,  then suspected statin induced muscle pains.  Says breathing has been good. Needed to use rescue inhaler twice for some tightness after being out in wind/ pollen. Continues allergy vaccine, reporting occasional local reaction at 1:50.  05/24/11- 73 yoF never smoker,  Followed for allergic rhinitis and ashtma with GERD. 5 weeks head and throat congestion with bad sore throat. [name] to Dr Loanne Drilling- treated doxy as sinusitis- finished x1 week ago. Marland Kitchen Has had laryngitis and tussive right lateral rib pains x 2 days. Denies change in chronic fibromyalgia pains,  She has to travel a long way to get here from [place] and is interested if we can get her allergy shots given through the [place] office. We discussed antibiotic choices.  07/16/11- 72 yoF never smoker,  Followed for allergic rhinitis and asthma with GERD. She is feeling well. We were not able to work out an arrangement for her to get her allergy shots in [place]. She feels allergy control is much better on vaccine but does get some local soreness at the injection sites, which are rotated. Her GERD has been active.  01/14/12- 68 yoF never smoker,  Followed for allergic rhinitis and asthma with GERD. Breathing doing great; no asthma attacks; recently had flare up of sinus infection; still on vaccine and doing well On only one day she needed rescue inhaler after smoke exposure. We had called in an antibiotic for sinusitis-worked well. Continues allergy vaccine at 1:50 with injections here. History of Raynaud's phenomenon. Has to wear gloves indoors in the summer. Asked-allow a [employer] form.  05/06/12- 65 yoF never smoker,  Followed for allergic rhinitis and asthma  with GERD. ACUTE: both ears having pressure and swollen neck area on Right side; increased sinus/throat congestion Onset of a cold one week ago. Feels weak has laryngitis some chills but no discolored sputum. Headache improved. Nasal congestion. Plan seem swollen. Chest little tight. Using her rescue inhaler. She says otherwise she had been doing better on allergy vaccine at 1:50 [place].  06/23/12- 65 yoF never smoker,  Followed for allergic rhinitis and asthma with GERD. Still on vaccine 1:50 GH and doing well it; denies any itchy, watery eyes or sneezing at this time. Occasional minor nasal and chest congestion which she can control. Occasional right ear pains without change in hearing. Pending right knee arthroscopy  10/28/12-  66 yoF never smoker,  Followed for allergic rhinitis and asthma with GERD FOLLOWS FOR: still on vaccine; states its too hard to get here every week to get shots. Recent upper respiratory infection resolved with Mucinex. Occasional mild shortness of breath-his rescue inhaler successfully maybe 2 or 3 times a month with no real pattern. Allergy vaccine had been at 1:50 she has become come very irregular because logistics are inconvenient. We discussed stopping her vaccine now after 3 years, but she may decide to finish her current supply and continue through the spring pollen season  03/17/13-66 yoF never smoker,  Followed for allergic rhinitis and asthma with GERD FOLLOWS FOR: 4 month follow up - reports has been doing well.  did have some allergy symptoms 2 weeks ago with outdoor activity but nasal spray has helped.  allergy  injection has "really helped" After one hour and an outdoor park she experienced nasal congestion, ear ache, but quite nasal discharge. She says that is not a cold. Using Nasalcrom. Wants to stay on allergy vaccine at  05/19/13-66 yoF never smoker,  Followed for allergic rhinitis and asthma with GERD ACUTE VISIT: has not taken allergy injection x 2 weeks;  recently had walking PNA-follow up from this.  3 weeks ago developed increased sinus congestion treated by her primary physician with Augmentin ending 3 or 4 days ago.  Continues allergy vaccine 1:10 GH  09/24/13- 49 yoF never smoker,  Followed for allergic rhinitis and asthma with GERD Follows For: Sinus and head congestion - PND - Ears feel full - Occas prod cough (clear) - Chest tightness Continues allergy vaccine 1:10 GH ACUTE VISIT: sore throat, chest and nasal congestion; cough-productive-thick and white to pale yellow in color.  We sent biaxin 1/15- helped. Got worse again this weekend with itching eyes, sore throat, head and chest congestion, temperature 99.5. Sinuses are clearing using Mucinex. CXR 09/24/13 IMPRESSION:  No active cardiopulmonary disease.  Electronically Signed  By: Kathreen Devoid  On: 09/24/2013 14:54  03/17/14- 67 yoF never smoker,  Followed for allergic rhinitis and asthma with GERD FOLLOWS FOR: Still on Allergy vaccine 1:10 GH and doing well; having nasal congestion. Minor nasal congestion but feels she is doing well with allergy vaccine. Occasional light cough but no recent wheezing. She drives here from [place] to get her shots and says her drugstore as indicated they might be able to give her allergy shots. She asks about getting Prevnar pneumonia vaccination. We reviewed her history and discussed this.  1/113/16- 67 yoF never smoker,  Followed for allergic rhinitis and asthma with GERD FOLLOWS FOR: Pt states she is doing well overall with allergy injections. She did state she had a rough time in December with her breathing(? bronchitis and SOB)-getting better now. FOLLOWS FOR: Pt states she is doing well overall with allergy injections. She did state she had a rough time in December with her breathing(? bronchitis and SOB)-getting better now.  03/30/15- 68 yoF never smoker,  Followed for allergic rhinitis and asthma with GERD Reports:congested; cough; runny nose;      Review of Systems-see HPI Constitutional:   No weight loss, night sweats,  Fevers, chills, fatigue, lassitude. HEENT:   No headaches,  Difficulty swallowing,  Tooth/dental problems,  Sore throat,                No sneezing, itching, or +ear ache,   +nasal congestion, post nasal drip, CV:  No- chest pain, orthopnea, PND, swelling in lower extremities, anasarca, dizziness, palpitations GI  No heartburn, indigestion, abdominal pain, nausea, vomiting,  Resp: No shortness of breath with exertion or at rest.  No excess mucus, no productive cough,  No                                No-non-productive cough,  No coughing up of blood.  No change in color of mucus.  +infrequent wheezing.   Skin: no rash or lesions. GU:  MS:  No joint pain or swelling.   Psych:  No change in mood or affect. No depression or anxiety.  No memory loss.    Objective:   Physical Exam General- Alert, Oriented, Affect-appropriate, Distress- none acute; pleasant and talkative; well appearing Skin- rash-none, lesions- none, excoriation- none Lymphadenopathy- none Head- atraumatic  Eyes- Gross vision intact, PERRLA, conjunctivae clear secretions, + periorbital edema            Ears- +cerumen R, L clear            Nose- + turbinate edema-mild , no-Septal dev, mucus, polyps, erosion, perforation .                        Throat- Malampatti III.   TMs not  retracted , mucosa a little red , drainage- none,                     tonsils- atrophic;  frequent throat clearing, +hoarse Neck- flexible , trachea midline, no stridor , thyroid nl, carotid no bruit Chest - symmetrical excursion , unlabored           Heart/CV- RRR , no murmur , no gallop  , no rub, nl s1 s2                           - JVD- none , edema- none, stasis changes- none, varices- none           Lung- coarse breath sounds without distinct wheeze or rhonchi, cough+dry , dullness-none, rub- none           Chest wall-  Abd-  Br/ Gen/ Rectal- Not  done, not indicated Extrem- cyanosis- none, clubbing, none, atrophy- none, strength- nl.  Neuro- grossly intact to observation

## 2015-03-30 NOTE — Patient Instructions (Signed)
Neb nasal neo  Depo [place] if needed

## 2015-04-04 ENCOUNTER — Encounter: Payer: Self-pay | Admitting: Internal Medicine

## 2015-04-04 ENCOUNTER — Ambulatory Visit (INDEPENDENT_AMBULATORY_CARE_PROVIDER_SITE_OTHER): Payer: Medicare Other | Admitting: Internal Medicine

## 2015-04-04 VITALS — BP 108/58 | HR 64 | Temp 97.9°F | Wt 133.0 lb

## 2015-04-04 DIAGNOSIS — I959 Hypotension, unspecified: Secondary | ICD-10-CM | POA: Diagnosis not present

## 2015-04-04 DIAGNOSIS — M791 Myalgia, unspecified site: Secondary | ICD-10-CM

## 2015-04-04 LAB — COMPREHENSIVE METABOLIC PANEL
ALT: 10 U/L (ref 0–35)
AST: 17 U/L (ref 0–37)
Albumin: 4.4 g/dL (ref 3.5–5.2)
Alkaline Phosphatase: 61 U/L (ref 39–117)
BUN: 19 mg/dL (ref 6–23)
CO2: 29 mEq/L (ref 19–32)
Calcium: 9.8 mg/dL (ref 8.4–10.5)
Chloride: 103 mEq/L (ref 96–112)
Creatinine, Ser: 0.91 mg/dL (ref 0.40–1.20)
GFR: 78.68 mL/min (ref >60.00)
Glucose, Bld: 89 mg/dL (ref 70–99)
Potassium: 3.9 mEq/L (ref 3.5–5.1)
Sodium: 138 mEq/L (ref 135–145)
Total Bilirubin: 0.3 mg/dL (ref 0.2–1.2)
Total Protein: 7.1 g/dL (ref 6.0–8.3)

## 2015-04-04 LAB — CBC
HCT: 38.2 % (ref 36.0–46.0)
Hemoglobin: 12.9 g/dL (ref 12.0–15.0)
MCHC: 33.7 g/dL (ref 30.0–36.0)
MCV: 86.1 fl (ref 78.0–100.0)
Platelets: 217 10*3/uL (ref 150.0–400.0)
RBC: 4.44 Mil/uL (ref 3.87–5.11)
RDW: 14.5 % (ref 11.5–15.5)
WBC: 7.8 10*3/uL (ref 4.0–10.5)

## 2015-04-04 MED ORDER — LOSARTAN POTASSIUM 50 MG PO TABS: 50.0000 mg | ORAL_TABLET | Freq: Every day | ORAL | Status: DC

## 2015-04-04 NOTE — Progress Notes (Signed)
Pre visit review using our clinic review tool, if applicable. No additional management support is needed unless otherwise documented below in the visit note. 
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
## 2015-04-04 NOTE — Patient Instructions (Signed)
Hypotension  As your heart beats, it forces blood through your arteries. This force is your blood pressure. If your blood pressure is too low for you to go about your normal activities or to support the organs of your body, you have hypotension. Hypotension is also referred to as low blood pressure. When your blood pressure becomes too low, you may not get enough blood to your brain. As a result, you may feel weak, feel lightheaded, or develop a rapid heart rate. In a more severe case, you may faint.  CAUSES  Various conditions can cause hypotension. These include:  · Blood loss.  · Dehydration.  · Heart or endocrine problems.  · Pregnancy.  · Severe infection.  · Not having a well-balanced diet filled with needed nutrients.  · Severe allergic reactions (anaphylaxis).  Some medicines, such as blood pressure medicine or water pills (diuretics), may lower your blood pressure below normal. Sometimes taking too much medicine or taking medicine not as directed can cause hypotension.  TREATMENT   Hospitalization is sometimes required for hypotension if fluid or blood replacement is needed, if time is needed for medicines to wear off, or if further monitoring is needed. Treatment might include changing your diet, changing your medicines (including medicines aimed at raising your blood pressure), and use of support stockings.  HOME CARE INSTRUCTIONS   · Drink enough fluids to keep your urine clear or pale yellow.  · Take your medicines as directed by your health care provider.  · Get up slowly from reclining or sitting positions. This gives your blood pressure a chance to adjust.  · Wear support stockings as directed by your health care provider.  · Maintain a healthy diet by including nutritious food, such as fruits, vegetables, nuts, whole grains, and lean meats.  SEEK MEDICAL CARE IF:  · You have vomiting or diarrhea.  · You have a fever for more than 2-3 days.  · You feel more thirsty than usual.  · You feel weak and  tired.  SEEK IMMEDIATE MEDICAL CARE IF:   · You have chest pain or a fast or irregular heartbeat.  · You have a loss of feeling in some part of your body, or you lose movement in your arms or legs.  · You have trouble speaking.  · You become sweaty or feel lightheaded.  · You faint.  MAKE SURE YOU:   · Understand these instructions.  · Will watch your condition.  · Will get help right away if you are not doing well or get worse.  Document Released: 08/27/2005 Document Revised: 06/17/2013 Document Reviewed: 02/27/2013  Exit[employer]® Patient Information ©2015 [employer]. This information is not intended to replace advice given to you by your health care provider. Make sure you discuss any questions you have with your health care provider.

## 2015-04-06 ENCOUNTER — Ambulatory Visit (INDEPENDENT_AMBULATORY_CARE_PROVIDER_SITE_OTHER): Payer: Medicare Other

## 2015-04-06 DIAGNOSIS — J309 Allergic rhinitis, unspecified: Secondary | ICD-10-CM | POA: Diagnosis not present

## 2015-04-18 ENCOUNTER — Encounter: Payer: Self-pay | Admitting: Family Medicine

## 2015-04-18 ENCOUNTER — Ambulatory Visit (INDEPENDENT_AMBULATORY_CARE_PROVIDER_SITE_OTHER): Payer: Medicare Other | Admitting: Family Medicine

## 2015-04-18 VITALS — BP 138/64 | HR 58 | Temp 97.7°F | Wt 136.8 lb

## 2015-04-18 DIAGNOSIS — I959 Hypotension, unspecified: Secondary | ICD-10-CM

## 2015-04-18 DIAGNOSIS — I10 Essential (primary) hypertension: Secondary | ICD-10-CM

## 2015-04-18 MED ORDER — OMEPRAZOLE 40 MG PO CPDR: 40.0000 mg | DELAYED_RELEASE_CAPSULE | Freq: Every day | ORAL | Status: DC

## 2015-04-18 MED ORDER — LOSARTAN POTASSIUM 50 MG PO TABS: 50.0000 mg | ORAL_TABLET | Freq: Every day | ORAL | Status: DC

## 2015-04-18 NOTE — Assessment & Plan Note (Signed)
With recent episodes of hypotension. Normotensive on new BP rx and feeling better. Reassurance provided. No changes made today.

## 2015-04-18 NOTE — Progress Notes (Signed)
Pre visit review using our clinic review tool, if applicable. No additional management support is needed unless otherwise documented below in the visit note. 

## 2015-04-18 NOTE — Patient Instructions (Signed)
Great to see you. Let's continue your current blood pressure medication.

## 2015-04-18 NOTE — Progress Notes (Signed)
Subjective:   Patient ID: Samantha [name], female    DOB: 1946/07/29, 69 y.o.   MRN: [id number]  MARILLYN GOREN is a pleasant 69 y.o. year old female who presents to clinic today with Follow-up  on 04/18/2015  HPI:  Saw Webb Silversmith on 04/04/15- was at pulmonary the week prior to the appointment and BP was low - 114/56-and told to hold her medications and follow up with [facility].  She was also complaining of symptoms of hypotension like dizziness and clamminess.    Rollene Fare advised her to stop Hyzaar and started losartan 50 mg daily.  Those symptoms have resolved.  CBC and CMET were checked.  Lab Results  Component Value Date   WBC 7.8 04/04/2015   HGB 12.9 04/04/2015   HCT 38.2 04/04/2015   MCV 86.1 04/04/2015   PLT 217.0 04/04/2015   Lab Results  Component Value Date   NA 138 04/04/2015   K 3.9 04/04/2015   CL 103 04/04/2015   CO2 29 04/04/2015   Lab Results  Component Value Date   CREATININE 0.91 04/04/2015   Lab Results  Component Value Date   ALT 10 04/04/2015   AST 17 04/04/2015   ALKPHOS 61 04/04/2015   BILITOT 0.3 04/04/2015   Advised to follow up with me in 3 weeks so she is here today.  Current Outpatient Prescriptions on File Prior to Visit  Medication Sig Dispense Refill  . albuterol (PROVENTIL HFA) 108 (90 BASE) MCG/ACT inhaler Inhale 2 puffs into the lungs every 6 (six) hours as needed. 3 Inhaler 3  . Ascorbic Acid (VITAMIN C) 500 MG tablet Take 500 mg by mouth daily.      . bifidobacterium infantis (ALIGN) capsule Take 1 capsule by mouth daily.      Marland Kitchen CALCIUM CITRATE PO Take 1 capsule by mouth daily. 600 mg    . cetirizine (ZYRTEC) 10 MG tablet Take 10 mg by mouth daily.    . Cholecalciferol (VITAMIN D3) 400 UNITS tablet Take 400 Units by mouth 2 (two) times daily with a meal.      . Coenzyme Q-10 60 MG CAPS Take 1 capsule by mouth 2 (two) times daily.     . Cyanocobalamin (VITAMIN B-12) 2500 MCG SUBL Place 1 tablet under the tongue daily. Contains Folic  Acid, B6, and Biotin    . Ginger, Zingiber officinalis, (GINGER PO) Take by mouth.    . losartan (COZAAR) 50 MG tablet Take 1 tablet (50 mg total) by mouth daily. 30 tablet 1  . losartan-hydrochlorothiazide (HYZAAR) 100-12.5 MG per tablet Take 1 tablet by mouth daily. 90 tablet 1  . Magnesium Malate POWD (1250mg  tabs) 1 tablet by mouth two times a day    . Misc Natural Products (TART CHERRY ADVANCED PO) Take by mouth.    . montelukast (SINGULAIR) 10 MG tablet 1 daily 90 tablet 3  . Omega-3 Fatty Acids (OMEGA 3 PO) Take one tablet daily    . omeprazole (PRILOSEC) 40 MG capsule Take 1 capsule (40 mg total) by mouth daily. 90 capsule 2  . thiamine 100 MG tablet Take 100 mg by mouth daily.      . TURMERIC PO Take by mouth daily.     No current facility-administered medications on file prior to visit.    Allergies  Allergen Reactions  . Cefuroxime Axetil     REACTION: pt not sure  . Sulfonamide Derivatives     REACTION: pt not sure    Past Medical  History  Diagnosis Date  . ASTHMA 04/14/2007  . HYPERCHOLESTEROLEMIA 01/07/2008  . HYPERTENSION 01/28/2008  . SINUSITIS 02/10/2009  . ALLERGIC RHINITIS 07/26/2008  . INSOMNIA 05/10/2008  . FIBROIDS, UTERUS 05/10/2008  . OSTEOPOROSIS 04/14/2007  . OSTEOARTHRITIS 11/28/2009  . GERD 04/14/2007  . Irritable bowel syndrome 04/06/2010  . CHEST PAIN 08/26/2008  . HYPERGLYCEMIA 11/28/2009  . 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 ASYMPTOMATIC POSTMENOPAUSAL STATUS 05/10/2008  . 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 Episcleritis   . Fibromyalgia     Past Surgical History  Procedure Laterality Date  . Nasal polyp surgery      Polypectomy and septoplasty  . Laparoscopy    . Neck injury  1983  . Stress cardiolite  02/13/2002  . Electrocardiogram  12/04/2006  . Colonoscopy w/ polypectomy      Family History  Problem Relation Age of Onset  . Heart disease Father     CHF  . Cancer Neg Hx     No FH of Colon Cancer    History   Social  History  . Marital Status: Widowed    Spouse Name: N/A  . Number of Children: N/A  . Years of Education: N/A   Occupational History  .      Retired Stage manager)   Social History Main Topics  . Smoking status: Never Smoker   . Smokeless tobacco: Never Used  . Alcohol Use: No  . Drug Use: No  . Sexual Activity: Not on file   Other Topics Concern  . Not on file   Social History Narrative   Lives alone (widowed).   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      Does not have living will.  Does desire CPR.  Does not want life support for prolonged periods of time if futile.   The PMH, PSH, Social History, Family History, Medications, and allergies have been reviewed in [facility], and have been updated if relevant.  Review of Systems  Constitutional: Negative.   HENT: Negative.   Respiratory: Negative.   Cardiovascular: Negative.   Endocrine: Negative.   Genitourinary: Negative.   Musculoskeletal: Negative.   Skin: Negative.   Allergic/Immunologic: Negative.   Psychiatric/Behavioral: Negative.   All other systems reviewed and are negative.      Objective:    BP 138/64 mmHg  Pulse 58  Temp(Src) 97.7 F (36.5 C) (Oral)  Wt 136 lb 12 oz (62.029 kg)  SpO2 98%   Physical Exam  Constitutional: She is oriented to person, place, and time. She appears well-developed and well-nourished. No distress.  HENT:  Head: Normocephalic and atraumatic.  Eyes: Conjunctivae are normal.  Neck: Normal range of motion.  Cardiovascular: Normal rate and regular rhythm.   Pulmonary/Chest: Effort normal and breath sounds normal.  Musculoskeletal: She exhibits no edema.  Neurological: She is alert and oriented to person, place, and time. No cranial nerve deficit.  Skin: Skin is warm and dry.  Psychiatric: She has a normal mood and affect. Her behavior is normal. Judgment and thought content normal.  Nursing note and vitals reviewed.         Assessment & Plan:   Essential  hypertension No Follow-up on file.

## 2015-04-18 NOTE — Addendum Note (Signed)
Addended by: Lucille Passy on: 04/18/2015 10:10 AM   Modules accepted: Orders, Medications

## 2015-04-18 NOTE — Addendum Note (Signed)
Addended by: Lucille Passy on: 04/18/2015 10:13 AM   Modules accepted: Orders

## 2015-04-20 ENCOUNTER — Ambulatory Visit (INDEPENDENT_AMBULATORY_CARE_PROVIDER_SITE_OTHER): Payer: Medicare Other

## 2015-04-20 DIAGNOSIS — J309 Allergic rhinitis, unspecified: Secondary | ICD-10-CM | POA: Diagnosis not present

## 2015-04-25 ENCOUNTER — Ambulatory Visit (INDEPENDENT_AMBULATORY_CARE_PROVIDER_SITE_OTHER): Payer: Medicare Other

## 2015-04-25 DIAGNOSIS — J309 Allergic rhinitis, unspecified: Secondary | ICD-10-CM

## 2015-05-04 ENCOUNTER — Ambulatory Visit (INDEPENDENT_AMBULATORY_CARE_PROVIDER_SITE_OTHER): Payer: Medicare Other

## 2015-05-04 DIAGNOSIS — J309 Allergic rhinitis, unspecified: Secondary | ICD-10-CM | POA: Diagnosis not present

## 2015-05-11 ENCOUNTER — Ambulatory Visit (INDEPENDENT_AMBULATORY_CARE_PROVIDER_SITE_OTHER): Payer: Medicare Other

## 2015-05-11 DIAGNOSIS — J309 Allergic rhinitis, unspecified: Secondary | ICD-10-CM | POA: Diagnosis not present

## 2015-05-18 ENCOUNTER — Ambulatory Visit (INDEPENDENT_AMBULATORY_CARE_PROVIDER_SITE_OTHER): Payer: Medicare Other

## 2015-05-18 DIAGNOSIS — J309 Allergic rhinitis, unspecified: Secondary | ICD-10-CM | POA: Diagnosis not present

## 2015-05-23 ENCOUNTER — Ambulatory Visit: Payer: Self-pay

## 2015-05-29 DIAGNOSIS — J069 Acute upper respiratory infection, unspecified: Secondary | ICD-10-CM | POA: Diagnosis not present

## 2015-05-30 ENCOUNTER — Other Ambulatory Visit (INDEPENDENT_AMBULATORY_CARE_PROVIDER_SITE_OTHER): Payer: Medicare Other

## 2015-05-30 ENCOUNTER — Other Ambulatory Visit: Payer: Self-pay | Admitting: Family Medicine

## 2015-05-30 ENCOUNTER — Other Ambulatory Visit: Payer: Medicare Other

## 2015-05-30 DIAGNOSIS — E7800 Pure hypercholesterolemia, unspecified: Secondary | ICD-10-CM

## 2015-05-30 DIAGNOSIS — H40003 Preglaucoma, unspecified, bilateral: Secondary | ICD-10-CM | POA: Diagnosis not present

## 2015-05-30 DIAGNOSIS — Z0000 Encounter for general adult medical examination without abnormal findings: Secondary | ICD-10-CM

## 2015-05-30 DIAGNOSIS — E041 Nontoxic single thyroid nodule: Secondary | ICD-10-CM

## 2015-05-30 DIAGNOSIS — I10 Essential (primary) hypertension: Secondary | ICD-10-CM

## 2015-05-30 LAB — LIPID PANEL
Cholesterol: 201 mg/dL — ABNORMAL HIGH (ref 0–200)
HDL: 97.1 mg/dL (ref >39.00)
LDL Cholesterol: 92 mg/dL (ref 0–99)
NonHDL: 103.86
Total CHOL/HDL Ratio: 2
Triglycerides: 59 mg/dL (ref 0.0–149.0)
VLDL: 11.8 mg/dL (ref 0.0–40.0)

## 2015-05-30 LAB — COMPREHENSIVE METABOLIC PANEL
ALT: 8 U/L (ref 0–35)
AST: 16 U/L (ref 0–37)
Albumin: 4.1 g/dL (ref 3.5–5.2)
Alkaline Phosphatase: 57 U/L (ref 39–117)
BUN: 13 mg/dL (ref 6–23)
CO2: 29 mEq/L (ref 19–32)
Calcium: 9.4 mg/dL (ref 8.4–10.5)
Chloride: 105 mEq/L (ref 96–112)
Creatinine, Ser: 0.87 mg/dL (ref 0.40–1.20)
GFR: 82.83 mL/min (ref >60.00)
Glucose, Bld: 108 mg/dL — ABNORMAL HIGH (ref 70–99)
Potassium: 3.9 mEq/L (ref 3.5–5.1)
Sodium: 140 mEq/L (ref 135–145)
Total Bilirubin: 0.4 mg/dL (ref 0.2–1.2)
Total Protein: 7.1 g/dL (ref 6.0–8.3)

## 2015-05-30 LAB — CBC WITH DIFFERENTIAL/PLATELET
Basophils Absolute: 0 10*3/uL (ref 0.0–0.1)
Basophils Relative: 0.5 % (ref 0.0–3.0)
Eosinophils Absolute: 0.1 10*3/uL (ref 0.0–0.7)
Eosinophils Relative: 1.5 % (ref 0.0–5.0)
HCT: 37.2 % (ref 36.0–46.0)
Hemoglobin: 12.5 g/dL (ref 12.0–15.0)
Lymphocytes Relative: 42.9 % (ref 12.0–46.0)
Lymphs Abs: 2.4 10*3/uL (ref 0.7–4.0)
MCHC: 33.7 g/dL (ref 30.0–36.0)
MCV: 86.6 fl (ref 78.0–100.0)
Monocytes Absolute: 0.4 10*3/uL (ref 0.1–1.0)
Monocytes Relative: 6.7 % (ref 3.0–12.0)
Neutro Abs: 2.7 10*3/uL (ref 1.4–7.7)
Neutrophils Relative %: 48.4 % (ref 43.0–77.0)
Platelets: 194 10*3/uL (ref 150.0–400.0)
RBC: 4.3 Mil/uL (ref 3.87–5.11)
RDW: 14.1 % (ref 11.5–15.5)
WBC: 5.6 10*3/uL (ref 4.0–10.5)

## 2015-05-30 LAB — TSH: TSH: 3.41 u[IU]/mL (ref 0.35–4.50)

## 2015-05-30 LAB — T4, FREE: Free T4: 0.91 ng/dL (ref 0.60–1.60)

## 2015-05-31 ENCOUNTER — Encounter: Payer: Self-pay | Admitting: Internal Medicine

## 2015-06-01 ENCOUNTER — Ambulatory Visit: Payer: Medicare Other

## 2015-06-08 ENCOUNTER — Ambulatory Visit: Payer: Medicare Other

## 2015-06-08 ENCOUNTER — Telehealth: Payer: Self-pay | Admitting: Family Medicine

## 2015-06-08 ENCOUNTER — Ambulatory Visit (INDEPENDENT_AMBULATORY_CARE_PROVIDER_SITE_OTHER): Payer: Medicare Other | Admitting: Family Medicine

## 2015-06-08 ENCOUNTER — Encounter: Payer: Self-pay | Admitting: Family Medicine

## 2015-06-08 VITALS — BP 138/68 | HR 81 | Temp 98.1°F | Ht 62.0 in | Wt 135.8 lb

## 2015-06-08 DIAGNOSIS — M810 Age-related osteoporosis without current pathological fracture: Secondary | ICD-10-CM

## 2015-06-08 DIAGNOSIS — Z23 Encounter for immunization: Secondary | ICD-10-CM

## 2015-06-08 DIAGNOSIS — Z0000 Encounter for general adult medical examination without abnormal findings: Secondary | ICD-10-CM | POA: Diagnosis not present

## 2015-06-08 DIAGNOSIS — I10 Essential (primary) hypertension: Secondary | ICD-10-CM | POA: Diagnosis not present

## 2015-06-08 DIAGNOSIS — J301 Allergic rhinitis due to pollen: Secondary | ICD-10-CM | POA: Diagnosis not present

## 2015-06-08 DIAGNOSIS — M797 Fibromyalgia: Secondary | ICD-10-CM | POA: Diagnosis not present

## 2015-06-08 DIAGNOSIS — Z860100 Personal history of colon polyps, unspecified: Secondary | ICD-10-CM

## 2015-06-08 DIAGNOSIS — G4700 Insomnia, unspecified: Secondary | ICD-10-CM

## 2015-06-08 DIAGNOSIS — I959 Hypotension, unspecified: Secondary | ICD-10-CM

## 2015-06-08 DIAGNOSIS — E7800 Pure hypercholesterolemia, unspecified: Secondary | ICD-10-CM

## 2015-06-08 MED ORDER — LOSARTAN POTASSIUM 50 MG PO TABS: 50.0000 mg | ORAL_TABLET | Freq: Every day | ORAL | Status: DC

## 2015-06-08 MED ORDER — OMEPRAZOLE 40 MG PO CPDR: 40.0000 mg | DELAYED_RELEASE_CAPSULE | Freq: Every day | ORAL | Status: DC

## 2015-06-08 NOTE — Assessment & Plan Note (Signed)
Well controlled.  No changes made. 

## 2015-06-08 NOTE — Addendum Note (Signed)
Addended by: Lucille Passy on: 06/08/2015 09:15 AM   Modules accepted: Miquel Dunn

## 2015-06-08 NOTE — Progress Notes (Signed)
Pre visit review using our clinic review tool, if applicable. No additional management support is needed unless otherwise documented below in the visit note. 

## 2015-06-08 NOTE — Telephone Encounter (Signed)
Pt called wanting to speak to dr Deborra Medina When i asked what it was regarding pt stated it was personal

## 2015-06-08 NOTE — Patient Instructions (Addendum)
Good to see you.   Try going back to nasocort.  Keep me updated with your symptoms.

## 2015-06-08 NOTE — Assessment & Plan Note (Signed)
Well controlled. No changes made today. 

## 2015-06-08 NOTE — Assessment & Plan Note (Signed)
The patients weight, height, BMI and visual acuity have been recorded in the chart.  Cognitive function assessed.   I have made referrals, counseling and provided education to the patient based review of the above and I have provided the pt with a written personalized care plan for preventive services.  Pneumovax and influenza vaccines given today.

## 2015-06-08 NOTE — Addendum Note (Signed)
Addended by: Modena Nunnery on: 06/08/2015 09:22 AM   Modules accepted: Orders

## 2015-06-08 NOTE — Assessment & Plan Note (Signed)
Followed by Dr. Estanislado Pandy.  Has appointment next week.

## 2015-06-08 NOTE — Assessment & Plan Note (Signed)
Deteriorated, lungs clear on exam. Reassurance provided- abx not indicated at this time. Advised follow up with pulm/allergist if symptoms persist, along with restarting nasocort. The patient indicates understanding of these issues and agrees with the plan.

## 2015-06-08 NOTE — Progress Notes (Signed)
Subjective:    Patient ID: Samantha [name], female    DOB: 23-Sep-1945, 69 y.o.   MRN: [id number]  HPI Very pleasant 69 yo female with h/o HTN, allergic rhinitis, GERD, asthma, fibromyalgia, here for annual Medicare Wellness Visit and follow up of chronic medical conditions.  I have personally reviewed the Medicare Annual Wellness questionnaire and have noted 1. The patient's medical and social history 2. Their use of alcohol, tobacco or illicit drugs 3. Their current medications and supplements 4. The patient's functional ability including ADL's, fall risks, home safety risks and hearing or visual             impairment. 5. Diet and physical activities 6. Evidence for depression or mood disorders  End of life wishes discussed and updated in Social History.  The roster of all physicians providing medical care to patient - is listed in the Snapshot section of the chart.   Td 11/09/02 Tdap 07/03/13 Zoster 06/17/13 Pneumovax 11/23/09 Prevnar 13- 03/17/14 Colonoscopy 04/21/10- [facility]ogram 10/27/14 DEXA 06/28/13 Saw GYN- Dr. Radene Knee 01/24/15 LMP in 1998. No h/o post menopausal bleeding.  Eye exam-05/30/15  Asthma - sees Dr. Annamaria Boots.  Las saw him on 03/30/15- note reviewed. Advised to continue current inhalers and singulair. Also receiving allergy shots.  Fibromyalgia- followed by Dr. Estanislado Pandy.  Was supposed to see her last week- rescheduled for October 5th.  Nasal congestion- past 3 weeks, progressive cough and congestion.  Has chills, not sure if she has had a fever.  Cough is more productive. Had run out of nasocort.   HTN- now taking Cozaar 50 mg daily. Denies HA, blurred vision, CP or SOB.  No LE edema. Lab Results  Component Value Date   CREATININE 0.87 05/30/2015   Lab Results  Component Value Date   CHOL 201* 05/30/2015   HDL 97.10 05/30/2015   LDLCALC 92 05/30/2015   TRIG 59.0 05/30/2015   CHOLHDL 2 05/30/2015   Lab Results  Component Value Date   WBC 5.6 05/30/2015    HGB 12.5 05/30/2015   HCT 37.2 05/30/2015   MCV 86.6 05/30/2015   PLT 194.0 05/30/2015   Lab Results  Component Value Date   TSH 3.41 05/30/2015    Patient Active Problem List   Diagnosis Date Noted  . Increased pressure in the eye 06/02/2014  . Encounter for Medicare annual wellness exam 05/13/2013  . Fibromyalgia 02/26/2013  . Allergic rhinitis due to pollen 11/20/2010  . 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 IRRITABLE BOWEL SYNDROME 04/06/2010  . THYROID NODULE, RIGHT 12/07/2009  . OSTEOARTHRITIS 11/28/2009  . FIBROIDS, UTERUS 05/10/2008  . Insomnia 05/10/2008  . ASYMPTOMATIC POSTMENOPAUSAL STATUS 05/10/2008  . Essential hypertension 01/28/2008  . HYPERCHOLESTEROLEMIA 01/07/2008  . GLAUCOMA 01/07/2008  . 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 GERD 04/14/2007  . Osteoporosis 04/14/2007  . History of colonic polyps 04/14/2007   Past Medical History  Diagnosis Date  . ASTHMA 04/14/2007  . HYPERCHOLESTEROLEMIA 01/07/2008  . HYPERTENSION 01/28/2008  . SINUSITIS 02/10/2009  . ALLERGIC RHINITIS 07/26/2008  . INSOMNIA 05/10/2008  . FIBROIDS, UTERUS 05/10/2008  . OSTEOPOROSIS 04/14/2007  . OSTEOARTHRITIS 11/28/2009  . GERD 04/14/2007  . Irritable bowel syndrome 04/06/2010  . CHEST PAIN 08/26/2008  . HYPERGLYCEMIA 11/28/2009  . 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 ASYMPTOMATIC POSTMENOPAUSAL STATUS 05/10/2008  . 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 Episcleritis   . Fibromyalgia    Past Surgical History  Procedure Laterality Date  . Nasal polyp surgery  Polypectomy and septoplasty  . Laparoscopy    . Neck injury  1983  . Stress cardiolite  02/13/2002  . Electrocardiogram  12/04/2006  . Colonoscopy w/ polypectomy     Social History  Substance Use Topics  . Smoking status: Never Smoker   . Smokeless tobacco: Never Used  . Alcohol Use: No   Family History  Problem Relation Age of Onset  . Heart disease Father     CHF  . Cancer Neg Hx     No FH of Colon  Cancer   Allergies  Allergen Reactions  . Cefuroxime Axetil     REACTION: pt not sure  . Sulfonamide Derivatives     REACTION: pt not sure   Current Outpatient Prescriptions on File Prior to Visit  Medication Sig Dispense Refill  . albuterol (PROVENTIL HFA) 108 (90 BASE) MCG/ACT inhaler Inhale 2 puffs into the lungs every 6 (six) hours as needed. 3 Inhaler 3  . Ascorbic Acid (VITAMIN C) 500 MG tablet Take 500 mg by mouth daily.      . bifidobacterium infantis (ALIGN) capsule Take 1 capsule by mouth daily.      Marland Kitchen CALCIUM CITRATE PO Take 1 capsule by mouth daily. 600 mg    . cetirizine (ZYRTEC) 10 MG tablet Take 10 mg by mouth daily.    . Cholecalciferol (VITAMIN D3) 400 UNITS tablet Take 400 Units by mouth 2 (two) times daily with a meal.      . Coenzyme Q-10 60 MG CAPS Take 1 capsule by mouth 2 (two) times daily.     . Cyanocobalamin (VITAMIN B-12) 2500 MCG SUBL Place 1 tablet under the tongue daily. Contains Folic Acid, B6, and Biotin    . Ginger, Zingiber officinalis, (GINGER PO) Take by mouth.    . Magnesium Malate POWD (1250mg  tabs) 1 tablet by mouth two times a day    . Misc Natural Products (TART CHERRY ADVANCED PO) Take by mouth.    . Omega-3 Fatty Acids (OMEGA 3 PO) Take one tablet daily    . thiamine 100 MG tablet Take 100 mg by mouth daily.      . Triamcinolone Acetonide (NASACORT ALLERGY 24HR NA) Place 1 spray into the nose.    . TURMERIC PO Take by mouth daily.    . montelukast (SINGULAIR) 10 MG tablet 1 daily 90 tablet 3   No current facility-administered medications on file prior to visit.   The PMH, PSH, Social History, Family History, Medications, and allergies have been reviewed in [facility], and have been updated if relevant.       Review of Systems  Constitutional: Positive for chills. Negative for fever.  HENT: Positive for congestion, postnasal drip and rhinorrhea. Negative for sinus pressure, sneezing, trouble swallowing and voice change.   Eyes: Negative.    Respiratory: Positive for cough. Negative for shortness of breath, wheezing and stridor.   Cardiovascular: Negative.   Gastrointestinal: Negative.   Endocrine: Negative.   Genitourinary: Negative.   Musculoskeletal: Negative.   Skin: Negative.   Allergic/Immunologic: Negative.   Neurological: Negative.   Hematological: Negative.   Psychiatric/Behavioral: Negative.   All other systems reviewed and are negative.   Objective:   Physical Exam  BP 138/68 mmHg  Pulse 81  Temp(Src) 98.1 F (36.7 C) (Oral)  Ht 5\' 2"  (1.575 m)  Wt 135 lb 12 oz (61.576 kg)  BMI 24.82 kg/m2  SpO2 99%  General:  Well-developed,well-nourished,in no acute distress; alert,appropriate and cooperative throughout examination Head:  normocephalic  and atraumatic.   Eyes:  vision grossly intact, pupils equal, pupils round, and pupils reactive to light.   Ears:  R ear normal and L ear normal.   Nose:  no external deformity.   +mucosal erythema with rhinorrhea, no sinus tenderness Mouth:  good dentition.   Neck:  No deformities, masses, or tenderness noted. Lungs:  Normal respiratory effort, chest expands symmetrically. Lungs are clear to auscultation, no crackles or wheezes. Heart:  Normal rate and regular rhythm. S1 and S2 normal without gallop, murmur, click, rub or other extra sounds. Abdomen:  Bowel sounds positive,abdomen soft and non-tender without masses, organomegaly or hernias noted. Msk:  No deformity or scoliosis noted of thoracic or lumbar spine.   Extremities:  No clubbing, cyanosis, edema, or deformity noted with normal full range of motion of all joints.   Neurologic:  alert & oriented X3 and gait normal.   Skin:  Intact without suspicious lesions or rashes Cervical Nodes:  No lymphadenopathy noted Axillary Nodes:  No palpable lymphadenopathy Psych:  Cognition and judgment appear intact. Alert and cooperative with normal attention span and concentration. No apparent delusions, illusions,  hallucinations     Assessment & Plan:

## 2015-06-09 ENCOUNTER — Telehealth: Payer: Self-pay | Admitting: Internal Medicine

## 2015-06-09 MED ORDER — DOXYCYCLINE HYCLATE 100 MG PO TABS: 100.0000 mg | ORAL_TABLET | Freq: Two times a day (BID) | ORAL | Status: DC

## 2015-06-09 NOTE — Telephone Encounter (Signed)
Offer doxycycline 100 mg, # 14, 1 twice daily 

## 2015-06-09 NOTE — Telephone Encounter (Signed)
Spoke with pt. She c/o having laryngitis and congestion off and on x 3 weeks. Last night had temp of 100.9 but today of 99.5. C/o pnd, nasal cong, HA, chest tx at times. Pt saw PCP and she gave pt yesterday her flu shot and PNA vaccine. Also was told to change nasal spray to nasal chrome. Pt is asking to be seen or have something called in. Please advise Dr. Annamaria Boots thank  Allergies  Allergen Reactions  . Cefuroxime Axetil     REACTION: pt not sure  . Sulfonamide Derivatives     REACTION: pt not sure     Current Outpatient Prescriptions on File Prior to Visit  Medication Sig Dispense Refill  . albuterol (PROVENTIL HFA) 108 (90 BASE) MCG/ACT inhaler Inhale 2 puffs into the lungs every 6 (six) hours as needed. 3 Inhaler 3  . Ascorbic Acid (VITAMIN C) 500 MG tablet Take 500 mg by mouth daily.      . bifidobacterium infantis (ALIGN) capsule Take 1 capsule by mouth daily.      Marland Kitchen CALCIUM CITRATE PO Take 1 capsule by mouth daily. 600 mg    . cetirizine (ZYRTEC) 10 MG tablet Take 10 mg by mouth daily.    . Cholecalciferol (VITAMIN D3) 400 UNITS tablet Take 400 Units by mouth 2 (two) times daily with a meal.      . Coenzyme Q-10 60 MG CAPS Take 1 capsule by mouth 2 (two) times daily.     . Cyanocobalamin (VITAMIN B-12) 2500 MCG SUBL Place 1 tablet under the tongue daily. Contains Folic Acid, B6, and Biotin    . Ginger, Zingiber officinalis, (GINGER PO) Take by mouth.    . losartan (COZAAR) 50 MG tablet Take 1 tablet (50 mg total) by mouth daily. 90 tablet 3  . Magnesium Malate POWD (1250mg  tabs) 1 tablet by mouth two times a day    . Misc Natural Products (TART CHERRY ADVANCED PO) Take by mouth.    . montelukast (SINGULAIR) 10 MG tablet 1 daily 90 tablet 3  . Omega-3 Fatty Acids (OMEGA 3 PO) Take one tablet daily    . omeprazole (PRILOSEC) 40 MG capsule Take 1 capsule (40 mg total) by mouth daily. 90 capsule 3  . thiamine 100 MG tablet Take 100 mg by mouth daily.      . Triamcinolone Acetonide  (NASACORT ALLERGY 24HR NA) Place 1 spray into the nose.    . TURMERIC PO Take by mouth daily.     No current facility-administered medications on file prior to visit.

## 2015-06-09 NOTE — Telephone Encounter (Signed)
Returned pt's call and answered her questions.  

## 2015-06-09 NOTE — Telephone Encounter (Signed)
Called spoke with pt. Aware of recs. She wants RX sent to [facility] in [place]. I have done so. Nothing further needed

## 2015-06-13 ENCOUNTER — Encounter: Payer: Self-pay | Admitting: Internal Medicine

## 2015-06-15 ENCOUNTER — Ambulatory Visit (INDEPENDENT_AMBULATORY_CARE_PROVIDER_SITE_OTHER): Payer: Medicare Other

## 2015-06-15 DIAGNOSIS — J309 Allergic rhinitis, unspecified: Secondary | ICD-10-CM

## 2015-06-15 DIAGNOSIS — M546 Pain in thoracic spine: Secondary | ICD-10-CM | POA: Diagnosis not present

## 2015-06-15 DIAGNOSIS — M170 Bilateral primary osteoarthritis of knee: Secondary | ICD-10-CM | POA: Diagnosis not present

## 2015-06-15 DIAGNOSIS — G4709 Other insomnia: Secondary | ICD-10-CM | POA: Diagnosis not present

## 2015-06-15 DIAGNOSIS — M797 Fibromyalgia: Secondary | ICD-10-CM | POA: Diagnosis not present

## 2015-06-20 ENCOUNTER — Ambulatory Visit (INDEPENDENT_AMBULATORY_CARE_PROVIDER_SITE_OTHER): Payer: Medicare Other

## 2015-06-20 ENCOUNTER — Telehealth: Payer: Self-pay | Admitting: Family Medicine

## 2015-06-20 ENCOUNTER — Ambulatory Visit (INDEPENDENT_AMBULATORY_CARE_PROVIDER_SITE_OTHER): Payer: Medicare Other | Admitting: Family Medicine

## 2015-06-20 ENCOUNTER — Ambulatory Visit: Payer: Medicare Other | Admitting: Family Medicine

## 2015-06-20 ENCOUNTER — Encounter: Payer: Self-pay | Admitting: Family Medicine

## 2015-06-20 VITALS — BP 132/72 | HR 64 | Temp 97.7°F | Wt 137.8 lb

## 2015-06-20 DIAGNOSIS — S32000A Wedge compression fracture of unspecified lumbar vertebra, initial encounter for closed fracture: Secondary | ICD-10-CM | POA: Insufficient documentation

## 2015-06-20 DIAGNOSIS — J309 Allergic rhinitis, unspecified: Secondary | ICD-10-CM

## 2015-06-20 DIAGNOSIS — S32000B Wedge compression fracture of unspecified lumbar vertebra, initial encounter for open fracture: Secondary | ICD-10-CM | POA: Diagnosis not present

## 2015-06-20 DIAGNOSIS — E2839 Other primary ovarian failure: Secondary | ICD-10-CM

## 2015-06-20 DIAGNOSIS — M810 Age-related osteoporosis without current pathological fracture: Secondary | ICD-10-CM | POA: Diagnosis not present

## 2015-06-20 NOTE — Progress Notes (Signed)
Pre visit review using our clinic review tool, if applicable. No additional management support is needed unless otherwise documented below in the visit note. 

## 2015-06-20 NOTE — Telephone Encounter (Signed)
Order placed

## 2015-06-20 NOTE — Telephone Encounter (Signed)
Pt called would like to get bone density done today before coming to see you today. She goes to [facility] for her bone density  Needs order

## 2015-06-20 NOTE — Progress Notes (Signed)
Subjective:   Patient ID: Samantha [name], female    DOB: 1945/11/29, 69 y.o.   MRN: [id number]  Samantha [name] is a pleasant 69 y.o. year old female who presents to clinic today with Follow-up  on 06/20/2015  HPI:  Dr. Estanislado Pandy did xrays which showed lumbar compression fxs and advised pt to come see me here today to discuss tx for osteoporosis.  Last DEXA done on 06/08/13-  Osteopenia. Lowest t score at -1.1 at right femoral neck  Current Outpatient Prescriptions on File Prior to Visit  Medication Sig Dispense Refill  . albuterol (PROVENTIL HFA) 108 (90 BASE) MCG/ACT inhaler Inhale 2 puffs into the lungs every 6 (six) hours as needed. 3 Inhaler 3  . Ascorbic Acid (VITAMIN C) 500 MG tablet Take 500 mg by mouth daily.      . bifidobacterium infantis (ALIGN) capsule Take 1 capsule by mouth daily.      Marland Kitchen CALCIUM CITRATE PO Take 1 capsule by mouth daily. 600 mg    . cetirizine (ZYRTEC) 10 MG tablet Take 10 mg by mouth daily.    . Cholecalciferol (VITAMIN D3) 400 UNITS tablet Take 400 Units by mouth 2 (two) times daily with a meal.      . Coenzyme Q-10 60 MG CAPS Take 1 capsule by mouth 2 (two) times daily.     . Cyanocobalamin (VITAMIN B-12) 2500 MCG SUBL Place 1 tablet under the tongue daily. Contains Folic Acid, B6, and Biotin    . Ginger, Zingiber officinalis, (GINGER PO) Take by mouth.    . losartan (COZAAR) 50 MG tablet Take 1 tablet (50 mg total) by mouth daily. 90 tablet 3  . Magnesium Malate POWD (1250mg  tabs) 1 tablet by mouth two times a day    . Misc Natural Products (TART CHERRY ADVANCED PO) Take by mouth.    . NONFORMULARY OR COMPOUNDED ITEM Allergy Vaccine 1:10 Given at [facility]    . Omega-3 Fatty Acids (OMEGA 3 PO) Take one tablet daily    . omeprazole (PRILOSEC) 40 MG capsule Take 1 capsule (40 mg total) by mouth daily. 90 capsule 3  . thiamine 100 MG tablet Take 100 mg by mouth daily.      . Triamcinolone Acetonide (NASACORT ALLERGY 24HR NA) Place 1 spray into  the nose.    . TURMERIC PO Take by mouth daily.    . montelukast (SINGULAIR) 10 MG tablet 1 daily 90 tablet 3   No current facility-administered medications on file prior to visit.    Allergies  Allergen Reactions  . Cefuroxime Axetil     REACTION: pt not sure  . Sulfonamide Derivatives     REACTION: pt not sure    Past Medical History  Diagnosis Date  . ASTHMA 04/14/2007  . HYPERCHOLESTEROLEMIA 01/07/2008  . HYPERTENSION 01/28/2008  . SINUSITIS 02/10/2009  . ALLERGIC RHINITIS 07/26/2008  . INSOMNIA 05/10/2008  . FIBROIDS, UTERUS 05/10/2008  . OSTEOPOROSIS 04/14/2007  . OSTEOARTHRITIS 11/28/2009  . GERD 04/14/2007  . Irritable bowel syndrome 04/06/2010  . CHEST PAIN 08/26/2008  . HYPERGLYCEMIA 11/28/2009  . 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 ASYMPTOMATIC POSTMENOPAUSAL STATUS 05/10/2008  . 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 Episcleritis   . Fibromyalgia     Past Surgical History  Procedure Laterality Date  . Nasal polyp surgery      Polypectomy and septoplasty  . Laparoscopy    . Neck injury  1983  . Stress cardiolite  02/13/2002  .  Electrocardiogram  12/04/2006  . Colonoscopy w/ polypectomy      Family History  Problem Relation Age of Onset  . Heart disease Father     CHF  . Cancer Neg Hx     No FH of Colon Cancer    Social History   Social History  . Marital Status: Widowed    Spouse Name: N/A  . Number of Children: N/A  . Years of Education: N/A   Occupational History  .      Retired Stage manager)   Social History Main Topics  . Smoking status: Never Smoker   . Smokeless tobacco: Never Used  . Alcohol Use: No  . Drug Use: No  . Sexual Activity: Not on file   Other Topics Concern  . Not on file   Social History Narrative   Lives alone (widowed).   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      Does not have living will.  Does desire CPR.  Does not want life support for prolonged periods of time if futile.   The PMH, PSH,  Social History, Family History, Medications, and allergies have been reviewed in [facility], and have been updated if relevant.   Review of Systems  Musculoskeletal: Positive for back pain.  Neurological: Negative.   All other systems reviewed and are negative.      Objective:    BP 132/72 mmHg  Pulse 64  Temp(Src) 97.7 F (36.5 C) (Oral)  Wt 137 lb 12 oz (62.483 kg)  SpO2 98%   Physical Exam  Constitutional: She is oriented to person, place, and time. She appears well-developed and well-nourished. No distress.  HENT:  Head: Normocephalic.  Cardiovascular: Normal rate.   Pulmonary/Chest: Effort normal.  Musculoskeletal: Normal range of motion.  Neurological: She is oriented to person, place, and time. No cranial nerve deficit.  Skin: Skin is warm and dry.  Psychiatric: She has a normal mood and affect. Her behavior is normal. Judgment and thought content normal.  Nursing note and vitals reviewed.         Assessment & Plan:   Lumbar compression fracture, open, initial encounter ([place])  Osteoporosis No Follow-up on file.

## 2015-06-20 NOTE — Assessment & Plan Note (Signed)
New- >15 minutes spent in face to face time with patient, >50% spent in counselling or coordination of care Advised awaiting results of bone density scan- already scheduled a week from today- before we discuss tx options for probable osteoporosis. The patient indicates understanding of these issues and agrees with the plan.

## 2015-06-22 ENCOUNTER — Ambulatory Visit (INDEPENDENT_AMBULATORY_CARE_PROVIDER_SITE_OTHER): Payer: Medicare Other

## 2015-06-22 ENCOUNTER — Telehealth: Payer: Self-pay | Admitting: Internal Medicine

## 2015-06-22 DIAGNOSIS — J309 Allergic rhinitis, unspecified: Secondary | ICD-10-CM

## 2015-06-23 NOTE — Telephone Encounter (Signed)
Allergy Serum Extract Date Mixed: 06/22/15 Vial: 1 Strength: 1:10 Here/Mail/Pick Up: here Mixed By: tbs

## 2015-06-27 ENCOUNTER — Ambulatory Visit (INDEPENDENT_AMBULATORY_CARE_PROVIDER_SITE_OTHER): Payer: Medicare Other

## 2015-06-27 ENCOUNTER — Ambulatory Visit (INDEPENDENT_AMBULATORY_CARE_PROVIDER_SITE_OTHER)
Admission: RE | Admit: 2015-06-27 | Discharge: 2015-06-27 | Disposition: A | Discharge status: Home / Self Care | Payer: Medicare Other | Source: Ambulatory Visit | Attending: Family Medicine | Admitting: Family Medicine

## 2015-06-27 DIAGNOSIS — E2839 Other primary ovarian failure: Secondary | ICD-10-CM

## 2015-06-27 DIAGNOSIS — J309 Allergic rhinitis, unspecified: Secondary | ICD-10-CM | POA: Diagnosis not present

## 2015-07-01 ENCOUNTER — Telehealth: Payer: Self-pay | Admitting: Internal Medicine

## 2015-07-01 MED ORDER — MONTELUKAST SODIUM 10 MG PO TABS: ORAL_TABLET | ORAL | Status: DC

## 2015-07-01 NOTE — Telephone Encounter (Signed)
Called and spoke to pt. Pt requesting refill of Singulair. Rx sent to preferred pharmacy. Pt aware to keep upcoming appt with CY. Pt verbalized understanding and denied any further questions or concerns at this time.

## 2015-07-04 ENCOUNTER — Encounter: Payer: Self-pay | Admitting: *Deleted

## 2015-07-06 ENCOUNTER — Ambulatory Visit (INDEPENDENT_AMBULATORY_CARE_PROVIDER_SITE_OTHER): Payer: Medicare Other

## 2015-07-06 DIAGNOSIS — J309 Allergic rhinitis, unspecified: Secondary | ICD-10-CM

## 2015-07-11 ENCOUNTER — Telehealth: Payer: Self-pay | Admitting: Family Medicine

## 2015-07-11 NOTE — Telephone Encounter (Signed)
Patient called to get her Bone Density results. When patient was told Bone Density was normal, patient asked what Dr.Aron thinks is causing the fractures in her back.

## 2015-07-13 ENCOUNTER — Telehealth: Payer: Self-pay | Admitting: Internal Medicine

## 2015-07-13 ENCOUNTER — Ambulatory Visit (INDEPENDENT_AMBULATORY_CARE_PROVIDER_SITE_OTHER): Payer: Medicare Other

## 2015-07-13 DIAGNOSIS — J309 Allergic rhinitis, unspecified: Secondary | ICD-10-CM | POA: Diagnosis not present

## 2015-07-13 NOTE — Telephone Encounter (Signed)
Spoke with pt, came in to get allergy injection.  I advised that CY has been in clinic all morning and has not yet addressed this message but that it would be addressed today.  Offered for pt to wait in lobby while I spoke to CY between patients, stated that she had a meeting to attend at 12:00 and could not wait.  I advised that she would be receiving a call today with recs from our office.  Pt wants to add to her message that she is having ear stuffiness.    CY please advise.  Thanks!

## 2015-07-13 NOTE — Telephone Encounter (Signed)
Spoke to pt and advised per Dr Deborra Medina. Copy of pts most recent labs mailed to her as requested. She misplaced copy given at CPE

## 2015-07-13 NOTE — Telephone Encounter (Signed)
Offer doxycycline 100 mg, # 8, 2 today then one daily 

## 2015-07-13 NOTE — Telephone Encounter (Signed)
Spoke with pt, c/o sinus congestion, postnasal drainage, L sided facial tenderness, HA, nonprod cough, chest tightness Xfew days.  Denies fever, chills. Has been using nasocort to help with s/s.   Pt uses [facility] on [address].    Last ov: 03/30/15 Next ov: 10/03/15  CY please advise.  Thanks!  Allergies  Allergen Reactions  . Cefuroxime Axetil     REACTION: pt not sure  . Sulfonamide Derivatives     REACTION: pt not sure   Current Outpatient Prescriptions on File Prior to Visit  Medication Sig Dispense Refill  . albuterol (PROVENTIL HFA) 108 (90 BASE) MCG/ACT inhaler Inhale 2 puffs into the lungs every 6 (six) hours as needed. 3 Inhaler 3  . Ascorbic Acid (VITAMIN C) 500 MG tablet Take 500 mg by mouth daily.      . bifidobacterium infantis (ALIGN) capsule Take 1 capsule by mouth daily.      Marland Kitchen CALCIUM CITRATE PO Take 1 capsule by mouth daily. 600 mg    . cetirizine (ZYRTEC) 10 MG tablet Take 10 mg by mouth daily.    . Cholecalciferol (VITAMIN D3) 400 UNITS tablet Take 400 Units by mouth 2 (two) times daily with a meal.      . Coenzyme Q-10 60 MG CAPS Take 1 capsule by mouth 2 (two) times daily.     . Cyanocobalamin (VITAMIN B-12) 2500 MCG SUBL Place 1 tablet under the tongue daily. Contains Folic Acid, B6, and Biotin    . Ginger, Zingiber officinalis, (GINGER PO) Take by mouth.    . losartan (COZAAR) 50 MG tablet Take 1 tablet (50 mg total) by mouth daily. 90 tablet 3  . Magnesium Malate POWD (1250mg  tabs) 1 tablet by mouth two times a day    . Misc Natural Products (TART CHERRY ADVANCED PO) Take by mouth.    . montelukast (SINGULAIR) 10 MG tablet 1 daily 90 tablet 3  . NONFORMULARY OR COMPOUNDED ITEM Allergy Vaccine 1:10 Given at [facility]    . Omega-3 Fatty Acids (OMEGA 3 PO) Take one tablet daily    . omeprazole (PRILOSEC) 40 MG capsule Take 1 capsule (40 mg total) by mouth daily. 90 capsule 3  . thiamine 100 MG tablet Take 100 mg by mouth daily.      . Triamcinolone  Acetonide (NASACORT ALLERGY 24HR NA) Place 1 spray into the nose.    . TURMERIC PO Take by mouth daily.     No current facility-administered medications on file prior to visit.

## 2015-07-13 NOTE — Telephone Encounter (Signed)
lmtcb

## 2015-07-13 NOTE — Telephone Encounter (Signed)
I do not think anyone could give her a definite answer for that . It is great news that her bone density is normal.

## 2015-07-13 NOTE — Telephone Encounter (Signed)
Pt in lobby to get rx or advice from nurse.

## 2015-07-14 MED ORDER — DOXYCYCLINE HYCLATE 100 MG PO TABS
ORAL_TABLET | ORAL | Status: DC
Start: 2015-07-14 — End: 2015-09-07

## 2015-07-14 NOTE — Telephone Encounter (Signed)
Called and spoke with the pt. Reviewed CY's recs. Verified pharmacy as [facility] in [place]. Rx was sent to pharmacy. She voiced understanding and had no further questions. Nothing further needed. Will sign off on message.

## 2015-07-18 ENCOUNTER — Encounter: Payer: Self-pay | Admitting: Internal Medicine

## 2015-07-20 ENCOUNTER — Ambulatory Visit (INDEPENDENT_AMBULATORY_CARE_PROVIDER_SITE_OTHER): Payer: Medicare Other

## 2015-07-20 DIAGNOSIS — J309 Allergic rhinitis, unspecified: Secondary | ICD-10-CM | POA: Diagnosis not present

## 2015-07-27 ENCOUNTER — Ambulatory Visit (INDEPENDENT_AMBULATORY_CARE_PROVIDER_SITE_OTHER): Payer: Medicare Other

## 2015-07-27 DIAGNOSIS — J309 Allergic rhinitis, unspecified: Secondary | ICD-10-CM

## 2015-08-01 ENCOUNTER — Ambulatory Visit: Payer: Medicare Other

## 2015-08-10 ENCOUNTER — Ambulatory Visit (INDEPENDENT_AMBULATORY_CARE_PROVIDER_SITE_OTHER): Payer: Medicare Other

## 2015-08-10 DIAGNOSIS — J309 Allergic rhinitis, unspecified: Secondary | ICD-10-CM | POA: Diagnosis not present

## 2015-08-15 ENCOUNTER — Ambulatory Visit (INDEPENDENT_AMBULATORY_CARE_PROVIDER_SITE_OTHER): Payer: Medicare Other

## 2015-08-15 DIAGNOSIS — J309 Allergic rhinitis, unspecified: Secondary | ICD-10-CM | POA: Diagnosis not present

## 2015-08-24 ENCOUNTER — Ambulatory Visit (INDEPENDENT_AMBULATORY_CARE_PROVIDER_SITE_OTHER): Payer: Medicare Other

## 2015-08-24 DIAGNOSIS — J309 Allergic rhinitis, unspecified: Secondary | ICD-10-CM | POA: Diagnosis not present

## 2015-08-31 ENCOUNTER — Ambulatory Visit: Payer: Medicare Other

## 2015-09-07 ENCOUNTER — Ambulatory Visit: Payer: Medicare Other

## 2015-09-07 ENCOUNTER — Encounter: Payer: Self-pay | Admitting: Family Medicine

## 2015-09-07 ENCOUNTER — Ambulatory Visit (INDEPENDENT_AMBULATORY_CARE_PROVIDER_SITE_OTHER): Payer: Medicare Other | Admitting: Family Medicine

## 2015-09-07 VITALS — BP 118/80 | HR 76 | Temp 98.0°F | Ht 62.0 in | Wt 138.5 lb

## 2015-09-07 DIAGNOSIS — J019 Acute sinusitis, unspecified: Secondary | ICD-10-CM | POA: Insufficient documentation

## 2015-09-07 DIAGNOSIS — J0100 Acute maxillary sinusitis, unspecified: Secondary | ICD-10-CM | POA: Diagnosis not present

## 2015-09-07 MED ORDER — AMOXICILLIN-POT CLAVULANATE 875-125 MG PO TABS: 1.0000 | ORAL_TABLET | Freq: Two times a day (BID) | ORAL | Status: DC

## 2015-09-07 MED ORDER — BENZONATATE 200 MG PO CAPS: 200.0000 mg | ORAL_CAPSULE | Freq: Three times a day (TID) | ORAL | Status: DC | PRN

## 2015-09-07 NOTE — Progress Notes (Signed)
Subjective:    Patient ID: Samantha [name], female    DOB: 08-27-1946, 69 y.o.   MRN: [id number]  HPI Here for uri symptoms   Started with runny nose and fever a week ago - Tmax 100.5 - then that came down  Now she is congested -head and chest   Cough- quite a bit - phlegm- thick and white Nasal discharge - yellow in color  Having facial pain- worse above the eyes   Ears were hurting-that has calmed down a little  Cough has calmed a bit - chest was sore   Using otc nasal saline and zyrtec and mucinex and some old tessalon  Also takes singulair daily   Patient Active Problem List   Diagnosis Date Noted  . Lumbar compression fracture ([place]) 06/20/2015  . Increased pressure in the eye 06/02/2014  . Encounter for Medicare annual wellness exam 05/13/2013  . Fibromyalgia 02/26/2013  . Allergic rhinitis due to pollen 11/20/2010  . 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 IRRITABLE BOWEL SYNDROME 04/06/2010  . THYROID NODULE, RIGHT 12/07/2009  . OSTEOARTHRITIS 11/28/2009  . FIBROIDS, UTERUS 05/10/2008  . Insomnia 05/10/2008  . ASYMPTOMATIC POSTMENOPAUSAL STATUS 05/10/2008  . Essential hypertension 01/28/2008  . HYPERCHOLESTEROLEMIA 01/07/2008  . GLAUCOMA 01/07/2008  . 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 GERD 04/14/2007  . Osteoporosis 04/14/2007  . History of colonic polyps 04/14/2007   Past Medical History  Diagnosis Date  . ASTHMA 04/14/2007  . HYPERCHOLESTEROLEMIA 01/07/2008  . HYPERTENSION 01/28/2008  . SINUSITIS 02/10/2009  . ALLERGIC RHINITIS 07/26/2008  . INSOMNIA 05/10/2008  . FIBROIDS, UTERUS 05/10/2008  . OSTEOPOROSIS 04/14/2007  . OSTEOARTHRITIS 11/28/2009  . GERD 04/14/2007  . Irritable bowel syndrome 04/06/2010  . CHEST PAIN 08/26/2008  . HYPERGLYCEMIA 11/28/2009  . 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 ASYMPTOMATIC POSTMENOPAUSAL STATUS 05/10/2008  . 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 Episcleritis   . Fibromyalgia    Past Surgical History  Procedure  Laterality Date  . Nasal polyp surgery      Polypectomy and septoplasty  . Laparoscopy    . Neck injury  1983  . Stress cardiolite  02/13/2002  . Electrocardiogram  12/04/2006  . Colonoscopy w/ polypectomy     Social History  Substance Use Topics  . Smoking status: Never Smoker   . Smokeless tobacco: Never Used  . Alcohol Use: No   Family History  Problem Relation Age of Onset  . Heart disease Father     CHF  . Cancer Neg Hx     No FH of Colon Cancer   Allergies  Allergen Reactions  . Cefuroxime Axetil     REACTION: pt not sure  . Sulfonamide Derivatives     REACTION: pt not sure   Current Outpatient Prescriptions on File Prior to Visit  Medication Sig Dispense Refill  . albuterol (PROVENTIL HFA) 108 (90 BASE) MCG/ACT inhaler Inhale 2 puffs into the lungs every 6 (six) hours as needed. 3 Inhaler 3  . Ascorbic Acid (VITAMIN C) 500 MG tablet Take 500 mg by mouth daily.      . bifidobacterium infantis (ALIGN) capsule Take 1 capsule by mouth daily.      Marland Kitchen CALCIUM CITRATE PO Take 1 capsule by mouth daily. 600 mg    . cetirizine (ZYRTEC) 10 MG tablet Take 10 mg by mouth daily.    . Cholecalciferol (VITAMIN D3) 400 UNITS tablet Take 400 Units by mouth 2 (two) times daily with a meal.      .  Coenzyme Q-10 60 MG CAPS Take 1 capsule by mouth 2 (two) times daily.     . Cyanocobalamin (VITAMIN B-12) 2500 MCG SUBL Place 1 tablet under the tongue daily. Contains Folic Acid, B6, and Biotin    . doxycycline (VIBRA-TABS) 100 MG tablet Take 2 tablets by mouth today and then 1 tablet daily, then stop 8 tablet 0  . Ginger, Zingiber officinalis, (GINGER PO) Take by mouth.    . losartan (COZAAR) 50 MG tablet Take 1 tablet (50 mg total) by mouth daily. 90 tablet 3  . Magnesium Malate POWD (1250mg  tabs) 1 tablet by mouth two times a day    . Misc Natural Products (TART CHERRY ADVANCED PO) Take by mouth.    . montelukast (SINGULAIR) 10 MG tablet 1 daily 90 tablet 3  . NONFORMULARY OR COMPOUNDED  ITEM Allergy Vaccine 1:10 Given at [facility]    . Omega-3 Fatty Acids (OMEGA 3 PO) Take one tablet daily    . omeprazole (PRILOSEC) 40 MG capsule Take 1 capsule (40 mg total) by mouth daily. 90 capsule 3  . thiamine 100 MG tablet Take 100 mg by mouth daily.      . Triamcinolone Acetonide (NASACORT ALLERGY 24HR NA) Place 1 spray into the nose.    . TURMERIC PO Take by mouth daily.     No current facility-administered medications on file prior to visit.    Review of Systems Review of Systems  Constitutional: Negative for fever, appetite change,  and unexpected weight change.  ENT pos for cong and rhinorrhea and sinus pain  Eyes: Negative for pain and visual disturbance.  Respiratory: Negative for wheeze and shortness of breath.   Cardiovascular: Negative for cp or palpitations    Gastrointestinal: Negative for nausea, diarrhea and constipation.  Genitourinary: Negative for urgency and frequency.  Skin: Negative for pallor or rash   Neurological: Negative for weakness, light-headedness, numbness and headaches.  Hematological: Negative for adenopathy. Does not bruise/bleed easily.  Psychiatric/Behavioral: Negative for dysphoric mood. The patient is not nervous/anxious.         Objective:   Physical Exam  Constitutional: She appears well-developed and well-nourished. No distress.  Well appearing   HENT:  Head: Normocephalic and atraumatic.  Right Ear: External ear normal.  Left Ear: External ear normal.  Mouth/Throat: Oropharynx is clear and moist. No oropharyngeal exudate.  Nares are injected and congested  Bilateral maxillary sinus tenderness (worse on the R) and also frontal sinuses Post nasal drip noted  Eyes: Conjunctivae and EOM are normal. Pupils are equal, round, and reactive to light. Right eye exhibits no discharge. Left eye exhibits no discharge.  Neck: Normal range of motion. Neck supple.  Cardiovascular: Normal rate and regular rhythm.   Pulmonary/Chest:  Effort normal and breath sounds normal. No respiratory distress. She has no wheezes. She has no rales.  No wheeze even on forced exp  Lymphadenopathy:    She has no cervical adenopathy.  Neurological: She is alert. No cranial nerve deficit.  Skin: Skin is warm and dry. No rash noted.  Psychiatric: She has a normal mood and affect.          Assessment & Plan:   Problem List Items Addressed This Visit      Respiratory   Acute sinusitis - Primary    Worse in R maxillary area  Continue mucinex and allergy meds and tessalon for cough  Cover with augmentin (pt states she has taken in the past w/o allergy) Recommend sinus rinses  Update if not starting to improve in a week or if worsening        Relevant Medications   amoxicillin-clavulanate (AUGMENTIN) 875-125 MG tablet   benzonatate (TESSALON) 200 MG capsule

## 2015-09-07 NOTE — Assessment & Plan Note (Signed)
Worse in R maxillary area  Continue mucinex and allergy meds and tessalon for cough  Cover with augmentin (pt states she has taken in the past w/o allergy) Recommend sinus rinses Update if not starting to improve in a week or if worsening

## 2015-09-07 NOTE — Progress Notes (Signed)
Pre visit review using our clinic review tool, if applicable. No additional management support is needed unless otherwise documented below in the visit note. 

## 2015-09-07 NOTE — Patient Instructions (Signed)
Take augmentin with food as directed for sinus infection  Continue current medicines Drink lots of fluids Update if not starting to improve in a week or if worsening

## 2015-09-09 ENCOUNTER — Telehealth: Payer: Self-pay | Admitting: Family Medicine

## 2015-09-09 MED ORDER — AZITHROMYCIN 250 MG PO TABS: ORAL_TABLET | ORAL | Status: DC

## 2015-09-09 NOTE — Telephone Encounter (Signed)
Pt called. Was seen this past Wed. The abx prescribed is giving her severe diarrhea to the point where she is seeing blood. She asked pharmsist and they told her to contact dr office to see if another abx can be called in. Please advise.  [facility] in [place] on [address]

## 2015-09-09 NOTE — Telephone Encounter (Signed)
Pt notified to stop augmentin (added to allergy list) and pt advised new abx sent to pharmacy

## 2015-09-09 NOTE — Telephone Encounter (Signed)
Stop augmentin  I sent in zpak  Add augmentin to med intol list please

## 2015-09-13 ENCOUNTER — Ambulatory Visit: Payer: Medicare Other | Admitting: Internal Medicine

## 2015-09-14 ENCOUNTER — Ambulatory Visit (INDEPENDENT_AMBULATORY_CARE_PROVIDER_SITE_OTHER): Payer: Medicare Other

## 2015-09-14 DIAGNOSIS — J309 Allergic rhinitis, unspecified: Secondary | ICD-10-CM | POA: Diagnosis not present

## 2015-09-21 ENCOUNTER — Ambulatory Visit: Payer: Medicare Other

## 2015-09-23 ENCOUNTER — Ambulatory Visit (INDEPENDENT_AMBULATORY_CARE_PROVIDER_SITE_OTHER): Payer: Medicare Other

## 2015-09-23 DIAGNOSIS — L300 Nummular dermatitis: Secondary | ICD-10-CM | POA: Diagnosis not present

## 2015-09-23 DIAGNOSIS — L816 Other disorders of diminished melanin formation: Secondary | ICD-10-CM | POA: Diagnosis not present

## 2015-09-23 DIAGNOSIS — J309 Allergic rhinitis, unspecified: Secondary | ICD-10-CM | POA: Diagnosis not present

## 2015-09-23 DIAGNOSIS — D1801 Hemangioma of skin and subcutaneous tissue: Secondary | ICD-10-CM | POA: Diagnosis not present

## 2015-09-23 DIAGNOSIS — L853 Xerosis cutis: Secondary | ICD-10-CM | POA: Diagnosis not present

## 2015-09-28 ENCOUNTER — Ambulatory Visit (INDEPENDENT_AMBULATORY_CARE_PROVIDER_SITE_OTHER): Payer: Medicare Other

## 2015-09-28 DIAGNOSIS — J309 Allergic rhinitis, unspecified: Secondary | ICD-10-CM

## 2015-10-03 ENCOUNTER — Encounter: Payer: Self-pay | Admitting: Internal Medicine

## 2015-10-03 ENCOUNTER — Ambulatory Visit: Payer: Medicare Other | Admitting: Internal Medicine

## 2015-10-03 ENCOUNTER — Ambulatory Visit (INDEPENDENT_AMBULATORY_CARE_PROVIDER_SITE_OTHER): Payer: Medicare Other | Admitting: Internal Medicine

## 2015-10-03 VITALS — BP 118/60 | HR 72 | Ht 62.0 in | Wt 140.0 lb

## 2015-10-03 DIAGNOSIS — J0100 Acute maxillary sinusitis, unspecified: Secondary | ICD-10-CM

## 2015-10-03 DIAGNOSIS — R21 Rash and other nonspecific skin eruption: Secondary | ICD-10-CM | POA: Diagnosis not present

## 2015-10-03 MED ORDER — CEFDINIR 300 MG PO CAPS: 300.0000 mg | ORAL_CAPSULE | Freq: Two times a day (BID) | ORAL | Status: DC

## 2015-10-03 MED ORDER — METHYLPREDNISOLONE ACETATE 80 MG/ML IJ SUSP
80.0000 mg | Freq: Once | INTRAMUSCULAR | Status: AC
  Administered 2015-10-03: 80 mg via INTRAMUSCULAR

## 2015-10-03 NOTE — Assessment & Plan Note (Signed)
She says dermatologist did scrapings and apparently didn't see anything, but a cutaneous fungal infection would be concern. It looks partly treated. She says she couldn't afford the cream prescribed by dermatologist. I suggested she use an OTC topical antifungal regularly for a while. If she doesn't get better she should go back to the dermatologist.

## 2015-10-03 NOTE — Assessment & Plan Note (Signed)
Treated as acute recurrent bilateral maxillary sinusitis Plan-nasal nebulizer decongestant, Depo-Medrol, cefdinir

## 2015-10-03 NOTE — Progress Notes (Signed)
Patient ID: Samantha [name], female    DOB: Sep 07, 1946, 70 y.o.   MRN: [id number]  HPI 01/15/11- 65 yoF never smoker,  Followed for allergic rhinitis and ashtma with GERD.  Last here September 05, 2010. Did well since last here, but was dx'd with fibromyalgia/ Dr Patrecia Pour,  then suspected statin induced muscle pains.  Says breathing has been good. Needed to use rescue inhaler twice for some tightness after being out in wind/ pollen. Continues allergy vaccine, reporting occasional local reaction at 1:50.  05/24/11- 73 yoF never smoker,  Followed for allergic rhinitis and ashtma with GERD. 5 weeks head and throat congestion with bad sore throat. [name] to Dr Loanne Drilling- treated doxy as sinusitis- finished x1 week ago. Marland Kitchen Has had laryngitis and tussive right lateral rib pains x 2 days. Denies change in chronic fibromyalgia pains,  She has to travel a long way to get here from [place] and is interested if we can get her allergy shots given through the [place] office. We discussed antibiotic choices.  07/16/11- 72 yoF never smoker,  Followed for allergic rhinitis and asthma with GERD. She is feeling well. We were not able to work out an arrangement for her to get her allergy shots in [place]. She feels allergy control is much better on vaccine but does get some local soreness at the injection sites, which are rotated. Her GERD has been active.  01/14/12- 68 yoF never smoker,  Followed for allergic rhinitis and asthma with GERD. Breathing doing great; no asthma attacks; recently had flare up of sinus infection; still on vaccine and doing well On only one day she needed rescue inhaler after smoke exposure. We had called in an antibiotic for sinusitis-worked well. Continues allergy vaccine at 1:50 with injections here. History of Raynaud's phenomenon. Has to wear gloves indoors in the summer. Asked-allow a [employer] form.  05/06/12- 65 yoF never smoker,  Followed for allergic rhinitis and asthma  with GERD. ACUTE: both ears having pressure and swollen neck area on Right side; increased sinus/throat congestion Onset of a cold one week ago. Feels weak has laryngitis some chills but no discolored sputum. Headache improved. Nasal congestion. Plan seem swollen. Chest little tight. Using her rescue inhaler. She says otherwise she had been doing better on allergy vaccine at 1:50 [place].  06/23/12- 65 yoF never smoker,  Followed for allergic rhinitis and asthma with GERD. Still on vaccine 1:50 GH and doing well it; denies any itchy, watery eyes or sneezing at this time. Occasional minor nasal and chest congestion which she can control. Occasional right ear pains without change in hearing. Pending right knee arthroscopy  10/28/12-  66 yoF never smoker,  Followed for allergic rhinitis and asthma with GERD FOLLOWS FOR: still on vaccine; states its too hard to get here every week to get shots. Recent upper respiratory infection resolved with Mucinex. Occasional mild shortness of breath-his rescue inhaler successfully maybe 2 or 3 times a month with no real pattern. Allergy vaccine had been at 1:50 she has become come very irregular because logistics are inconvenient. We discussed stopping her vaccine now after 3 years, but she may decide to finish her current supply and continue through the spring pollen season  03/17/13-66 yoF never smoker,  Followed for allergic rhinitis and asthma with GERD FOLLOWS FOR: 4 month follow up - reports has been doing well.  did have some allergy symptoms 2 weeks ago with outdoor activity but nasal spray has helped.  allergy  injection has "really helped" After one hour and an outdoor park she experienced nasal congestion, ear ache, but quite nasal discharge. She says that is not a cold. Using Nasalcrom. Wants to stay on allergy vaccine at  05/19/13-66 yoF never smoker,  Followed for allergic rhinitis and asthma with GERD ACUTE VISIT: has not taken allergy injection x 2 weeks;  recently had walking PNA-follow up from this.  3 weeks ago developed increased sinus congestion treated by her primary physician with Augmentin ending 3 or 4 days ago.  Continues allergy vaccine 1:10 GH  09/24/13- 33 yoF never smoker,  Followed for allergic rhinitis and asthma with GERD Follows For: Sinus and head congestion - PND - Ears feel full - Occas prod cough (clear) - Chest tightness Continues allergy vaccine 1:10 GH ACUTE VISIT: sore throat, chest and nasal congestion; cough-productive-thick and white to pale yellow in color.  We sent biaxin 1/15- helped. Got worse again this weekend with itching eyes, sore throat, head and chest congestion, temperature 99.5. Sinuses are clearing using Mucinex. CXR 09/24/13 IMPRESSION:  No active cardiopulmonary disease.  Electronically Signed  By: Kathreen Devoid  On: 09/24/2013 14:54  03/17/14- 67 yoF never smoker,  Followed for allergic rhinitis and asthma with GERD FOLLOWS FOR: Still on Allergy vaccine 1:10 GH and doing well; having nasal congestion. Minor nasal congestion but feels she is doing well with allergy vaccine. Occasional light cough but no recent wheezing. She drives here from [place] to get her shots and says her drugstore as indicated they might be able to give her allergy shots. She asks about getting Prevnar pneumonia vaccination. We reviewed her history and discussed this.  1/113/16- 67 yoF never smoker,  Followed for allergic rhinitis and asthma with GERD FOLLOWS FOR: Pt states she is doing well overall with allergy injections. She did state she had a rough time in December with her breathing(? bronchitis and SOB)-getting better now. FOLLOWS FOR: Pt states she is doing well overall with allergy injections. She did state she had a rough time in December with her breathing(? bronchitis and SOB)-getting better now.  03/30/15- 68 yoF never smoker,  Followed for allergic rhinitis and asthma with GERD Reports:congested; cough; runny nose;    10/03/2015-70 year old female never smoker followed for allergic rhinitis, asthma, complicated by GERD FOLLOWS FOR: Pt continues allergy injections and no reactions. Pt is having more flare ups lately with allergies or from being around others that are sick-was not able to take allergy shot. Significant bronchitis at Christmas time. Primary provider treated for sinusitis then with Augmentin which was not tolerated because of diarrhea and switched to Z-Pak. Complains of some persistent nasal stuffiness and drainage, bilateral maxillary sinus ache. No fever or purulent discharge. New incidental problem-rash on wrist  Review of Systems-see HPI Constitutional:   No weight loss, night sweats,  Fevers, chills, fatigue, lassitude. HEENT:   No headaches,  Difficulty swallowing,  Tooth/dental problems,  Sore throat,                No sneezing, itching, or +ear ache,   +nasal congestion, post nasal drip, CV:  No- chest pain, orthopnea, PND, swelling in lower extremities, anasarca, dizziness, palpitations GI  No heartburn, indigestion, abdominal pain, nausea, vomiting,  Resp: No shortness of breath with exertion or at rest.  No excess mucus, no productive cough,  No  No-non-productive cough,  No coughing up of blood.  No change in color of mucus.  +infrequent wheezing.   Skin: + Rash GU:  MS:  No joint pain or swelling.   Psych:  No change in mood or affect. No depression or anxiety.  No memory loss.    Objective:   Physical Exam General- Alert, Oriented, Affect-appropriate, Distress- none acute; pleasant and talkative; well appearing Skin- + depressed hyperpigmented patches on volar left wrist with mildly erythematous raised margin Lymphadenopathy- none Head- atraumatic            Eyes- Gross vision intact, PERRLA, conjunctivae clear secretions, + periorbital edema            Ears- +cerumen R, L clear            Nose- + turbinate edema-mild , no-Septal dev, mucus,  polyps, erosion, perforation .                        Throat- Malampatti III.   TMs not  retracted , mucosa a little red , drainage- none,                     tonsils- atrophic;  frequent throat clearing, +hoarse Neck- flexible , trachea midline, no stridor , thyroid nl, carotid no bruit Chest - symmetrical excursion , unlabored           Heart/CV- RRR , no murmur , no gallop  , no rub, nl s1 s2                           - JVD- none , edema- none, stasis changes- none, varices- none           Lung- coarse breath sounds without distinct wheeze or rhonchi, cough+dry , dullness-none, rub- none           Chest wall-  Abd-  Br/ Gen/ Rectal- Not done, not indicated Extrem- cyanosis- none, clubbing, none, atrophy- none, strength- nl.  Neuro- grossly intact to observation

## 2015-10-03 NOTE — Patient Instructions (Addendum)
Script for cefdinir antibiotic sent to [facility] in [facility] neo nasal   Depo 80  Consider an otc ointment for yeast and fungal infections, like lotimin or one of the products used for skin fungus infection  like athletes foot

## 2015-10-05 ENCOUNTER — Other Ambulatory Visit: Payer: Self-pay

## 2015-10-05 DIAGNOSIS — Z1231 Encounter for screening mammogram for malignant neoplasm of breast: Secondary | ICD-10-CM

## 2015-10-12 ENCOUNTER — Ambulatory Visit (INDEPENDENT_AMBULATORY_CARE_PROVIDER_SITE_OTHER): Payer: Medicare Other

## 2015-10-12 DIAGNOSIS — J309 Allergic rhinitis, unspecified: Secondary | ICD-10-CM | POA: Diagnosis not present

## 2015-10-19 ENCOUNTER — Ambulatory Visit (INDEPENDENT_AMBULATORY_CARE_PROVIDER_SITE_OTHER): Payer: Medicare Other

## 2015-10-19 DIAGNOSIS — J309 Allergic rhinitis, unspecified: Secondary | ICD-10-CM

## 2015-10-31 ENCOUNTER — Ambulatory Visit: Payer: Medicare Other

## 2015-10-31 ENCOUNTER — Ambulatory Visit (INDEPENDENT_AMBULATORY_CARE_PROVIDER_SITE_OTHER): Payer: Medicare Other

## 2015-10-31 ENCOUNTER — Ambulatory Visit
Admission: RE | Admit: 2015-10-31 | Discharge: 2015-10-31 | Disposition: A | Discharge status: Home / Self Care | Payer: Medicare Other | Source: Ambulatory Visit

## 2015-10-31 DIAGNOSIS — J309 Allergic rhinitis, unspecified: Secondary | ICD-10-CM

## 2015-10-31 DIAGNOSIS — Z1231 Encounter for screening mammogram for malignant neoplasm of breast: Secondary | ICD-10-CM | POA: Diagnosis not present

## 2015-11-01 ENCOUNTER — Telehealth: Payer: Self-pay | Admitting: Internal Medicine

## 2015-11-01 DIAGNOSIS — J309 Allergic rhinitis, unspecified: Secondary | ICD-10-CM | POA: Diagnosis not present

## 2015-11-01 NOTE — Telephone Encounter (Signed)
Allergy Serum Extract Date Mixed: 11/01/15 Vial: 1 Strength: 1;10 Here/Mail/Pick Up: HERE Mixed By: TBS Last OV: 10/03/15 Pending OV: 04/02/16

## 2015-11-09 ENCOUNTER — Ambulatory Visit (INDEPENDENT_AMBULATORY_CARE_PROVIDER_SITE_OTHER): Payer: Medicare Other

## 2015-11-09 DIAGNOSIS — J309 Allergic rhinitis, unspecified: Secondary | ICD-10-CM | POA: Diagnosis not present

## 2015-11-16 ENCOUNTER — Telehealth: Payer: Self-pay | Admitting: Internal Medicine

## 2015-11-16 ENCOUNTER — Ambulatory Visit (INDEPENDENT_AMBULATORY_CARE_PROVIDER_SITE_OTHER): Payer: Medicare Other | Admitting: *Deleted

## 2015-11-16 DIAGNOSIS — J309 Allergic rhinitis, unspecified: Secondary | ICD-10-CM

## 2015-11-16 NOTE — Telephone Encounter (Signed)
LMOMTCB x 1 

## 2015-11-17 MED ORDER — EPINEPHRINE 0.3 MG/0.3ML IJ SOAJ: 0.3000 mg | Freq: Once | INTRAMUSCULAR | Status: DC

## 2015-11-17 NOTE — Telephone Encounter (Signed)
Called and spoke to pt. Pt requesting refill on Epipen. Rx sent to preferred pharmacy. Pt verbalized understanding and denied any further questions or concerns at this time.

## 2015-11-21 ENCOUNTER — Ambulatory Visit: Payer: Medicare Other

## 2015-11-21 ENCOUNTER — Telehealth: Payer: Self-pay | Admitting: Internal Medicine

## 2015-11-21 NOTE — Telephone Encounter (Signed)
Offer tramadol 50 mg, # 30, 1 every 6 hours if needed for cough or rib pain. Continue zyrtec antihistamine, benzonatate cough perles and nasalcrom nasal spray

## 2015-11-21 NOTE — Telephone Encounter (Signed)
Patient refused Tramadol.  She said that she will call her PCP and get an appointment with him today. Nothing further needed.

## 2015-11-21 NOTE — Telephone Encounter (Signed)
Cough with clear mucus production, laryngitis, chest tightness, chest congestion x 1 week. Using Mucinex, saline nasal gel and Nasacrom Pt states that her sides are hurting as well but does not feel that it is related to her cough -- pt states that she has not been coughing hard enough to hurt her sides. Pt states that the pain is in the upper part of her ribs.  Allergies  Allergen Reactions  . Augmentin [Amoxicillin-Pot Clavulanate] Diarrhea  . Cefuroxime Axetil     REACTION: pt not sure  . Sulfonamide Derivatives     REACTION: pt not sure     Medication List       This list is accurate as of: 11/21/15  8:40 AM.  Always use your most recent med list.               albuterol 108 (90 Base) MCG/ACT inhaler  Commonly known as:  PROVENTIL HFA  Inhale 2 puffs into the lungs every 6 (six) hours as needed.     benzonatate 200 MG capsule  Commonly known as:  TESSALON  Take 1 capsule (200 mg total) by mouth 3 (three) times daily as needed for cough (swallow whole- do not bite pill).     bifidobacterium infantis capsule  Take 1 capsule by mouth daily.     CALCIUM CITRATE PO  Take 1 capsule by mouth daily. 600 mg     cefdinir 300 MG capsule  Commonly known as:  OMNICEF  Take 1 capsule (300 mg total) by mouth 2 (two) times daily.     cetirizine 10 MG tablet  Commonly known as:  ZYRTEC  Take 10 mg by mouth daily.     Coenzyme Q-10 60 MG Caps  Take 1 capsule by mouth 2 (two) times daily.     EPINEPHrine 0.3 mg/0.3 mL Soaj injection  Commonly known as:  EPI-PEN  Inject 0.3 mLs (0.3 mg total) into the muscle once.     GINGER PO  Take by mouth.     losartan 50 MG tablet  Commonly known as:  COZAAR  Take 1 tablet (50 mg total) by mouth daily.     Magnesium Malate Powd  (1250mg  tabs) 1 tablet by mouth two times a day     montelukast 10 MG tablet  Commonly known as:  SINGULAIR  1 daily     NASACORT ALLERGY 24HR NA  Place 1 spray into the nose.     NONFORMULARY OR  COMPOUNDED ITEM  Allergy Vaccine 1:10 Given at [facility]     OMEGA 3 PO  Take one tablet daily     omeprazole 40 MG capsule  Commonly known as:  PRILOSEC  Take 1 capsule (40 mg total) by mouth daily.     TART CHERRY ADVANCED PO  Take by mouth.     thiamine 100 MG tablet  Take 100 mg by mouth daily.     TURMERIC PO  Take by mouth daily.     Vitamin B-12 2500 MCG Subl  Place 1 tablet under the tongue daily. Contains Folic Acid, B6, and Biotin     vitamin C 500 MG tablet  Commonly known as:  ASCORBIC ACID  Take 500 mg by mouth daily.     Vitamin D3 400 units tablet  Take 400 Units by mouth 2 (two) times daily with a meal.      Patient states that if she cannot be seen here then she is going to go to either UC  or ED. Please advise Dr Annamaria Boots. Thanks.

## 2015-11-21 NOTE — Telephone Encounter (Signed)
Pt calling back to say that she can take augmentin said pharm told her how to take w/o it bothering her.Samantha [name]

## 2015-11-23 DIAGNOSIS — J069 Acute upper respiratory infection, unspecified: Secondary | ICD-10-CM | POA: Diagnosis not present

## 2015-11-30 DIAGNOSIS — H40003 Preglaucoma, unspecified, bilateral: Secondary | ICD-10-CM | POA: Diagnosis not present

## 2015-12-05 DIAGNOSIS — H40003 Preglaucoma, unspecified, bilateral: Secondary | ICD-10-CM | POA: Diagnosis not present

## 2015-12-06 ENCOUNTER — Telehealth: Payer: Self-pay | Admitting: Internal Medicine

## 2015-12-06 MED ORDER — PREDNISONE 10 MG PO TABS: 10.0000 mg | ORAL_TABLET | Freq: Every day | ORAL | Status: DC

## 2015-12-06 MED ORDER — DOXYCYCLINE HYCLATE 100 MG PO TABS: ORAL_TABLET | ORAL | Status: DC

## 2015-12-06 NOTE — Telephone Encounter (Signed)
Spoke with pt and advised of Dr Young's recommendations.  Rx sent to pharmacy. 

## 2015-12-06 NOTE — Telephone Encounter (Signed)
Suggest prednisone 10 mg,  # 7, 1 daily                 Doxycycline 100 mg, # 8,   2 today then one daily  Stay well- hydrated

## 2015-12-06 NOTE — Telephone Encounter (Signed)
Pt is scheduled to get her Allergy injections 12/07/15 Pt did not get injections last week.  Pt called in this morning and was given abx for cough.  Pt wants to know if okay to skip another week or if CY wants her to skip this injection since she has a cough. Please advise CY. Thanks.

## 2015-12-06 NOTE — Telephone Encounter (Signed)
Spoke with pt.  PT seen at urgent care on 11/23/15.  Given Zpak and Pred taper.  Finished on 3/20 and 3/23.  Still c/o sob ( increase use of albuterol), PND (thick, white) ,  hoarseness and feels neck glands are swollen.  Denies cough, fever or sorethroat.  Using Nasal saline, nasalcrom, albuterol and mucinex.  Pt never filled rx for Tramadol that we last suggested because she states she is not really coughing a lot.  Please advise.  Allergies  Allergen Reactions  . Augmentin [Amoxicillin-Pot Clavulanate] Diarrhea  . Cefuroxime Axetil     REACTION: pt not sure  . Sulfonamide Derivatives     REACTION: pt not sure    Current Outpatient Prescriptions on File Prior to Visit  Medication Sig Dispense Refill  . albuterol (PROVENTIL HFA) 108 (90 BASE) MCG/ACT inhaler Inhale 2 puffs into the lungs every 6 (six) hours as needed. 3 Inhaler 3  . Ascorbic Acid (VITAMIN C) 500 MG tablet Take 500 mg by mouth daily.      . benzonatate (TESSALON) 200 MG capsule Take 1 capsule (200 mg total) by mouth 3 (three) times daily as needed for cough (swallow whole- do not bite pill). 30 capsule 0  . bifidobacterium infantis (ALIGN) capsule Take 1 capsule by mouth daily.      Marland Kitchen CALCIUM CITRATE PO Take 1 capsule by mouth daily. 600 mg    . cefdinir (OMNICEF) 300 MG capsule Take 1 capsule (300 mg total) by mouth 2 (two) times daily. 14 capsule 0  . cetirizine (ZYRTEC) 10 MG tablet Take 10 mg by mouth daily.    . Cholecalciferol (VITAMIN D3) 400 UNITS tablet Take 400 Units by mouth 2 (two) times daily with a meal.      . Coenzyme Q-10 60 MG CAPS Take 1 capsule by mouth 2 (two) times daily.     . Cyanocobalamin (VITAMIN B-12) 2500 MCG SUBL Place 1 tablet under the tongue daily. Contains Folic Acid, B6, and Biotin    . EPINEPHrine 0.3 mg/0.3 mL IJ SOAJ injection Inject 0.3 mLs (0.3 mg total) into the muscle once. 1 Device 1  . Ginger, Zingiber officinalis, (GINGER PO) Take by mouth.    . losartan (COZAAR) 50 MG tablet Take  1 tablet (50 mg total) by mouth daily. 90 tablet 3  . Magnesium Malate POWD (1250mg  tabs) 1 tablet by mouth two times a day    . Misc Natural Products (TART CHERRY ADVANCED PO) Take by mouth.    . montelukast (SINGULAIR) 10 MG tablet 1 daily 90 tablet 3  . NONFORMULARY OR COMPOUNDED ITEM Allergy Vaccine 1:10 Given at [facility]    . Omega-3 Fatty Acids (OMEGA 3 PO) Take one tablet daily    . omeprazole (PRILOSEC) 40 MG capsule Take 1 capsule (40 mg total) by mouth daily. 90 capsule 3  . thiamine 100 MG tablet Take 100 mg by mouth daily.      . Triamcinolone Acetonide (NASACORT ALLERGY 24HR NA) Place 1 spray into the nose.    . TURMERIC PO Take by mouth daily.     No current facility-administered medications on file prior to visit.

## 2015-12-06 NOTE — Telephone Encounter (Signed)
Per CY >> pt is okay to come tomorrow to get injection. Pt should not continue to skip injections, she will be behind schedule if she does this.

## 2015-12-06 NOTE — Telephone Encounter (Signed)
Pt aware of rec's per CY Nothing further needed. 

## 2015-12-07 ENCOUNTER — Ambulatory Visit (INDEPENDENT_AMBULATORY_CARE_PROVIDER_SITE_OTHER): Payer: Medicare Other | Admitting: *Deleted

## 2015-12-07 DIAGNOSIS — J309 Allergic rhinitis, unspecified: Secondary | ICD-10-CM

## 2015-12-12 ENCOUNTER — Ambulatory Visit (INDEPENDENT_AMBULATORY_CARE_PROVIDER_SITE_OTHER): Payer: Medicare Other | Admitting: *Deleted

## 2015-12-12 DIAGNOSIS — M5030 Other cervical disc degeneration, unspecified cervical region: Secondary | ICD-10-CM | POA: Diagnosis not present

## 2015-12-12 DIAGNOSIS — E559 Vitamin D deficiency, unspecified: Secondary | ICD-10-CM | POA: Diagnosis not present

## 2015-12-12 DIAGNOSIS — Z79899 Other long term (current) drug therapy: Secondary | ICD-10-CM | POA: Diagnosis not present

## 2015-12-12 DIAGNOSIS — M546 Pain in thoracic spine: Secondary | ICD-10-CM | POA: Diagnosis not present

## 2015-12-12 DIAGNOSIS — M797 Fibromyalgia: Secondary | ICD-10-CM | POA: Diagnosis not present

## 2015-12-12 DIAGNOSIS — J309 Allergic rhinitis, unspecified: Secondary | ICD-10-CM

## 2015-12-12 DIAGNOSIS — R5381 Other malaise: Secondary | ICD-10-CM | POA: Diagnosis not present

## 2015-12-21 ENCOUNTER — Ambulatory Visit (INDEPENDENT_AMBULATORY_CARE_PROVIDER_SITE_OTHER): Payer: Medicare Other | Admitting: *Deleted

## 2015-12-21 DIAGNOSIS — J309 Allergic rhinitis, unspecified: Secondary | ICD-10-CM

## 2015-12-28 ENCOUNTER — Ambulatory Visit (INDEPENDENT_AMBULATORY_CARE_PROVIDER_SITE_OTHER): Payer: Medicare Other | Admitting: *Deleted

## 2015-12-28 DIAGNOSIS — J309 Allergic rhinitis, unspecified: Secondary | ICD-10-CM

## 2016-01-02 ENCOUNTER — Encounter: Payer: Self-pay | Admitting: Physical Therapy

## 2016-01-02 ENCOUNTER — Ambulatory Visit: Payer: Medicare Other | Attending: Rheumatology | Admitting: Physical Therapy

## 2016-01-02 ENCOUNTER — Ambulatory Visit: Payer: Medicare Other

## 2016-01-02 DIAGNOSIS — M546 Pain in thoracic spine: Secondary | ICD-10-CM | POA: Diagnosis not present

## 2016-01-02 DIAGNOSIS — M6281 Muscle weakness (generalized): Secondary | ICD-10-CM | POA: Diagnosis not present

## 2016-01-02 NOTE — Therapy (Addendum)
[facility] [address], [place], [address] Phone: [phone]   Fax:  [phone]  Physical Therapy Evaluation  Patient Details  Name: Samantha [name] MRN: [id number] Date of Birth: 1945-12-09 Referring Provider: Cy Blamer, MD  Encounter Date: 01/02/2016      PT End of Session - 01/02/16 1056    Visit Number 1   Number of Visits 12   Date for PT Re-Evaluation 02/13/16   Authorization Type 1   Authorization Time Period 10 (G code)   PT Start Time 0925   PT Stop Time 1030   PT Time Calculation (min) 65 min   Activity Tolerance Patient tolerated treatment well   Behavior During Therapy [facility] for tasks assessed/performed      Past Medical History  Diagnosis Date  . ASTHMA 04/14/2007  . HYPERCHOLESTEROLEMIA 01/07/2008  . HYPERTENSION 01/28/2008  . SINUSITIS 02/10/2009  . ALLERGIC RHINITIS 07/26/2008  . INSOMNIA 05/10/2008  . FIBROIDS, UTERUS 05/10/2008  . OSTEOPOROSIS 04/14/2007  . OSTEOARTHRITIS 11/28/2009  . GERD 04/14/2007  . Irritable bowel syndrome 04/06/2010  . CHEST PAIN 08/26/2008  . HYPERGLYCEMIA 11/28/2009  . 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 ASYMPTOMATIC POSTMENOPAUSAL STATUS 05/10/2008  . 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 Episcleritis   . Fibromyalgia     Past Surgical History  Procedure Laterality Date  . Nasal polyp surgery      Polypectomy and septoplasty  . Laparoscopy    . Neck injury  1983  . Stress cardiolite  02/13/2002  . Electrocardiogram  12/04/2006  . Colonoscopy w/ polypectomy      There were no vitals filed for this visit.       Subjective Assessment - 01/02/16 0945    Subjective Patient reports mid back pain along the C-T junction down to L1 that radiates laterally towards both scapulae. Pt states the pain has been improving since the recent aggravation of symptoms. Aggravating factors include lifting, coughing, lying on her back, sititng for  longer periods of time (over 1 hour). Pt states medication helps decrease the pain (NSAIDS) as well as sitting erect in a chair, and heat application. States she also experiences neck and low back pain intermittently but this does not coincide with the thoracic back pain.    Pertinent History History of Fibromyalgia; Thoracic pain stated "over 10 years ago" and had a recent aggravation of symptoms "sometime last year".   Limitations Sitting   How long can you sit comfortably? 1 hour   Diagnostic tests X-Ray: Positive for previous compression fracture (per pt report)   Patient Stated Goals To decrease the mid back pain.    Currently in Pain? Yes   Pain Score 5    Pain Location Back   Pain Orientation Upper;Mid;Right;Left   Pain Descriptors / Indicators Aching;Sharp   Pain Type Chronic pain;Acute pain   Pain Onset More than a month ago   Pain Frequency Intermittent   Aggravating Factors  lifting, sitting, writing, lying on her back   Pain Relieving Factors medication, heat, lying on her stomach            [facility] PT Assessment - 01/02/16 0936    Assessment   Medical Diagnosis DDD thoracic spine   Referring Provider Cy Blamer, MD   Onset Date/Surgical Date 09/10/14   Hand Dominance Right   Next MD Visit unknown   Prior Therapy No   Balance Screen   Has the  patient fallen in the past 6 months No   Has the patient had a decrease in activity level because of a fear of falling?  No   Is the patient reluctant to leave their home because of a fear of falling?  No   Home Environment   Living Environment Private residence   Living Arrangements Alone   Available Help at Discharge Family   Type of [place] to enter   Entrance Stairs-Number of Steps 1   Entrance Stairs-Rails None   Home Layout One level   Prior Function   Level of [place] Retired   Leisure Take care of grandchildren, writing   Cognition   Overall Cognitive  Status Within Functional Limits for tasks assessed      Objective: Observation: Sitting posture: slight forward head posture with forwarded rounded shoulders. Bilateral abducted scapula resting position. Elevated scapula resting position on right versus left.  Palpation: Increased tenderness to palpation along thoracic extensors, upper traps, and lower traps,   Measurement:  Thoracic AROM/MMT: Flexion: WNL -- pain at end range and when arising to neutral, Extension: Decreased by 50% , Left lateral flexion: WNL pain at ER, Right lateral flexion: WNL, Right rotation: WNL, Left rotation: WNL Shoulder AROM/MMT: Flexion: WNL,  ABD: Pain at 90 degrees reported in thoracic spine Repeated thoracic extension: x 10 -- slight decrease in pain reported  Outcome Measures:  Modified Oswestry: 42% affected  Therapeutic Exercise: Patient performed exercises with guidance, verbal and tactile cues and demonstration of therapist: Scapular retraction: x5 -- Decreased AROM; ceased due to increased pain Thoracic extension over back of the chair -- x 10  Manual Therapy:  STM to upper traps, lower traps, and thoracic extensors utilizing deep and superficial techniques to decrease muscle spasms and guarding with the patient positioned in prone.  Modalities: Two moist heating pads applied to bilateral shoulders with patient in sitting to decrease muscle spasms and guarding. Skin checked with no adverse signs noted.   Patient response to treatment: Significant decrease in pain noted after moist heat indicating improved muscle spasms and tightness. Slight decrease in pain noted after repeated extensions indicating improved joint positioning and motor control.          PT Education - 01/02/16 1055    Education provided Yes   Education Details HEP: thoracic extension over chair, scapular retraction; educated on comfortable positioning   Person(s) Educated Patient   Methods Explanation;Demonstration    Comprehension Verbalized understanding;Returned demonstration             PT Long Term Goals - 01/02/16 1110    PT LONG TERM GOAL #1   Title Pt will score under 20% on the modified oswestry by 02/13/16 to demonstrate significant improvement in mid/low back function and improved ability to perform lifting activities.   Baseline Modified Oswestry: 42%   Status New   PT LONG TERM GOAL #2   Title Pt will be independent with HEP performance and progression aimed towards improving thoracic muscular endurance, coordination, and strength by 02/13/16 to continue improvements after discharge from therapy.   Baseline Dependent with exercises performance and progression   Status New   PT LONG TERM GOAL #3   Title Patient will be able to sit for over 1 hour without experiencing thoracic spine pain by 02/13/16 to better be able to write at home without onset of pain.    Baseline Pain is significantly increased after 1 hour.  Status New               Plan - 01-11-2016 1618    Clinical Impression Statement Patient is a 70 yo right hand dominant female presenting with thoracic back pain. Thoracic dysfunction is indicated by increased pain with thoracic movements and a modified oswestery score of 42% (Indicating moderate thoracic dysfunction). Patient demonstrates limitations with muscle coordination, endurance and strength and will benefit from further skilled therapy aimed at addressing these limitations to return to prior level of function.    Rehab Potential Good   Clinical Impairments Affecting Rehab Potential (-): age, fibromyalgia; (+): family support, highly motivated.   PT Frequency 2x / week   PT Duration 6 weeks   PT Treatment/Interventions Electrical Stimulation;Iontophoresis 4mg /ml Dexamethasone;Moist Heat;Therapeutic activities;Therapeutic exercise;Ultrasound;Neuromuscular re-education;Patient/family education;Manual techniques;Passive range of motion   PT Next Visit Plan Screen cervical  spine, scapular retractions   PT Home Exercise Plan thoracic ext,    Consulted and Agree with Plan of Care Patient      Patient will benefit from skilled therapeutic intervention in order to improve the following deficits and impairments:  Decreased strength, Postural dysfunction, Decreased mobility, Hypomobility, Pain, Decreased endurance, Increased muscle spasms, Decreased range of motion, Decreased coordination  Visit Diagnosis: Muscle weakness (generalized)  Pain in thoracic spine      G-Codes - 2016/01/11 1230    Functional Assessment Tool Used Modified Oswestry, pain scale, ROM, strength, clinical judgement   Functional Limitation Changing and maintaining body position   Changing and Maintaining Body Position Current Status [id number]) At least 40 percent but less than 60 percent impaired, limited or restricted   Changing and Maintaining Body Position Goal Status [id number]) At least 1 percent but less than 20 percent impaired, limited or restricted       Problem List Patient Active Problem List   Diagnosis Date Noted  . Rash and nonspecific skin eruption 10/03/2015  . Acute sinusitis 09/07/2015  . Lumbar compression fracture ([place]) 06/20/2015  . Increased pressure in the eye 06/02/2014  . Encounter for Medicare annual wellness exam 05/13/2013  . Fibromyalgia 02/26/2013  . Allergic rhinitis due to pollen 11/20/2010  . 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 IRRITABLE BOWEL SYNDROME 04/06/2010  . THYROID NODULE, RIGHT 12/07/2009  . OSTEOARTHRITIS 11/28/2009  . FIBROIDS, UTERUS 05/10/2008  . Insomnia 05/10/2008  . ASYMPTOMATIC POSTMENOPAUSAL STATUS 05/10/2008  . Essential hypertension 01/28/2008  . HYPERCHOLESTEROLEMIA 01/07/2008  . GLAUCOMA 01/07/2008  . 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 GERD 04/14/2007  . Osteoporosis 04/14/2007  . History of colonic polyps 04/14/2007    Blythe Stanford, SPT 01/03/2016, 9:21 AM  [facility] [address], [place], [address] Phone: [phone]   Fax:  [phone]  Name: Samantha [name] MRN: [id number] Date of Birth: [birth year]

## 2016-01-03 ENCOUNTER — Ambulatory Visit (INDEPENDENT_AMBULATORY_CARE_PROVIDER_SITE_OTHER): Payer: Medicare Other | Admitting: Gastroenterology

## 2016-01-03 ENCOUNTER — Ambulatory Visit (INDEPENDENT_AMBULATORY_CARE_PROVIDER_SITE_OTHER): Payer: Medicare Other | Admitting: *Deleted

## 2016-01-03 ENCOUNTER — Encounter: Payer: Self-pay | Admitting: Gastroenterology

## 2016-01-03 VITALS — BP 138/70 | HR 76 | Ht 62.0 in | Wt 140.2 lb

## 2016-01-03 DIAGNOSIS — J029 Acute pharyngitis, unspecified: Secondary | ICD-10-CM

## 2016-01-03 DIAGNOSIS — R1031 Right lower quadrant pain: Secondary | ICD-10-CM

## 2016-01-03 DIAGNOSIS — J309 Allergic rhinitis, unspecified: Secondary | ICD-10-CM | POA: Diagnosis not present

## 2016-01-03 DIAGNOSIS — R1314 Dysphagia, pharyngoesophageal phase: Secondary | ICD-10-CM | POA: Diagnosis not present

## 2016-01-03 DIAGNOSIS — R079 Chest pain, unspecified: Secondary | ICD-10-CM | POA: Diagnosis not present

## 2016-01-03 DIAGNOSIS — F418 Other specified anxiety disorders: Secondary | ICD-10-CM

## 2016-01-03 DIAGNOSIS — K6289 Other specified diseases of anus and rectum: Secondary | ICD-10-CM | POA: Diagnosis not present

## 2016-01-03 DIAGNOSIS — R490 Dysphonia: Secondary | ICD-10-CM

## 2016-01-03 DIAGNOSIS — K219 Gastro-esophageal reflux disease without esophagitis: Secondary | ICD-10-CM | POA: Diagnosis not present

## 2016-01-03 NOTE — Progress Notes (Signed)
    History of Present Illness: This is a 70 year old female here for the evaluation of multiple complaints-SOB, chest pain, sore throat, hoarseness, RLQ pain, rectal pain, GERD, abdominal bloating, swallowing problems for 2 months. She underwent EGD and colonoscopy in August 2011 showing a Schatzki ring, small HH and biopsy changes of acid reflux. Colonoscopy showed mild diverticulosis and it was unremarkable. All her symptoms have improved over the past 2 weeks. Her GERD symptoms improved with taking Zantac 150 mg daily along with omeprazole daily. Rectal pain occurs with BMs 2-3 times per months. She relates difficulties swallowing liquids solids and pills intermittently for the past year. Patient is concerned about potential long-term side effects of omeprazole. Blood work from 05/2015 reviewed. Denies weight loss, constipation, diarrhea, change in stool caliber, melena, hematochezia, nausea, vomiting, dysphagia, reflux symptoms, chest pain.  Review of Systems: Pertinent positive and negative review of systems were noted in the above HPI section. All other review of systems were otherwise negative.  Current Medications, Allergies, Past Medical History, Past Surgical History, Family History and Social History were reviewed in [employer] record.  Physical Exam: General: Well developed, well nourished, no acute distress Head: Normocephalic and atraumatic Eyes:  sclerae anicteric, EOMI Ears: Normal auditory acuity Mouth: No deformity or lesions Neck: Supple, no masses or thyromegaly Lungs: Clear throughout to auscultation Heart: Regular rate and rhythm; no murmurs, rubs or bruits Abdomen: Soft, mild RLQ tenderness to light palpation and non distended. No masses, hepatosplenomegaly or hernias noted. Normal Bowel sounds Rectal: decreased sphincter tone, no lesions, no tenderness, heme neg stool Musculoskeletal: Symmetrical with no gross deformities  Skin: No lesions on  visible extremities Pulses:  Normal pulses noted Extremities: No clubbing, cyanosis, edema or deformities noted Neurological: Alert oriented x 4, grossly nonfocal Cervical Nodes:  No significant cervical adenopathy Inguinal Nodes: No significant inguinal adenopathy Psychological:  Alert and cooperative. Anxious.   Assessment and Recommendations:  1. GERD. Continue omeprazole 40 mg daily and standard antireflux measures. Add Ranitidine 150 mg every evening if GERD symptoms flare and if this regimen is not adequate will change to omeprazole 40 mg twice daily. Reassured as to the overall safety of omeprazole except for osteoporosis risk. Bone density study in 06/2015 was normal. It is unlikely her reflux symptoms will be adequately controlled without a PPI. Pt reassured.   2. Liquids, solids and pill dysphagia. Rule out oropharyngeal and esophageal disorders. Schedule barium esophagram with tablet. Consider MBSS and EGD with possible dilation based on barium esophagram results.   3. Intermittent mild rectal pain with bowel movement, decreased anal sphincter tone. DRE was normal with heme negative stool and unremarkable colonoscopy in 04/2010. Possible internal hemorrhoids. Prep H supp daily as needed. Advised to call back if symptoms persist or worsen. Pt reassured.   4. Abdominal bloating and RLQ pain, likely musculoskeletal or IBS related. Gas-X qid as needed. If symptoms persist or worsen consider imaging study. Consider trial of antispasmotics.   5. Mild diverticulosis. High fiber diet. Pt reassured.   6. CRC screening average risk. 10 year interval screening colonoscopy recommended in August 2021.   7. Anxiety about health.

## 2016-01-03 NOTE — Patient Instructions (Signed)
You have been scheduled for a Barium Esophogram at [facility] (1st floor of the hospital) on 01/05/16 at 10:30am. Please arrive 15 minutes prior to your appointment for registration. Make certain not to have anything to eat or drink 6 hours prior to your test. If you need to reschedule for any reason, please contact radiology at [phone] to do so. __________________________________________________________________ A barium swallow is an examination that concentrates on views of the esophagus. This tends to be a double contrast exam (barium and two liquids which, when combined, create a gas to distend the wall of the oesophagus) or single contrast (non-ionic iodine based). The study is usually tailored to your symptoms so a good history is essential. Attention is paid during the study to the form, structure and configuration of the esophagus, looking for functional disorders (such as aspiration, dysphagia, achalasia, motility and reflux) EXAMINATION You may be asked to change into a gown, depending on the type of swallow being performed. A radiologist and radiographer will perform the procedure. The radiologist will advise you of the type of contrast selected for your procedure and direct you during the exam. You will be asked to stand, sit or lie in several different positions and to hold a small amount of fluid in your mouth before being asked to swallow while the imaging is performed .In some instances you may be asked to swallow barium coated marshmallows to assess the motility of a solid food bolus. The exam can be recorded as a digital or video fluoroscopy procedure. POST PROCEDURE It will take 1-2 days for the barium to pass through your system. To facilitate this, it is important, unless otherwise directed, to increase your fluids for the next 24-48hrs and to resume your normal diet.  This test typically takes about 30 minutes to  perform. __________________________________________________________________________________  Thank you for choosing me and [facility].  Pricilla Riffle. Dagoberto Ligas., MD., Marval Regal

## 2016-01-04 ENCOUNTER — Encounter: Payer: Medicare Other | Admitting: Physical Therapy

## 2016-01-04 ENCOUNTER — Encounter: Payer: Self-pay | Admitting: Physical Therapy

## 2016-01-04 ENCOUNTER — Ambulatory Visit: Payer: Medicare Other | Admitting: Physical Therapy

## 2016-01-04 DIAGNOSIS — M546 Pain in thoracic spine: Secondary | ICD-10-CM

## 2016-01-04 DIAGNOSIS — M6281 Muscle weakness (generalized): Secondary | ICD-10-CM | POA: Diagnosis not present

## 2016-01-04 NOTE — Therapy (Signed)
[facility] [address] [place], [address] Phone: [phone]   Fax:  [phone]  Physical Therapy Treatment  Patient Details  Name: Samantha [name] MRN: [id number] Date of Birth: [birth year] Referring Provider: Cy Blamer, MD  Encounter Date: 01/04/2016      PT End of Session - 01/04/16 1527    Visit Number 2   Number of Visits 12   Date for PT Re-Evaluation 02/13/16   Authorization Type 2   Authorization Time Period 10 (G code)   PT Start Time 1030   PT Stop Time 1119   PT Time Calculation (min) 49 min   Activity Tolerance Patient tolerated treatment well   Behavior During Therapy [name] for tasks assessed/performed      Past Medical History  Diagnosis Date  . ASTHMA 04/14/2007  . HYPERCHOLESTEROLEMIA 01/07/2008  . HYPERTENSION 01/28/2008  . SINUSITIS 02/10/2009  . ALLERGIC RHINITIS 07/26/2008  . INSOMNIA 05/10/2008  . FIBROIDS, UTERUS 05/10/2008  . OSTEOPOROSIS 04/14/2007  . OSTEOARTHRITIS 11/28/2009  . GERD 04/14/2007  . Irritable bowel syndrome 04/06/2010  . CHEST PAIN 08/26/2008  . HYPERGLYCEMIA 11/28/2009  . 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 ASYMPTOMATIC POSTMENOPAUSAL STATUS 05/10/2008  . 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 Episcleritis   . Fibromyalgia     Past Surgical History  Procedure Laterality Date  . Nasal polyp surgery      Polypectomy and septoplasty  . Laparoscopy    . Neck injury  1983  . Stress cardiolite  02/13/2002  . Electrocardiogram  12/04/2006  . Colonoscopy w/ polypectomy      There were no vitals filed for this visit.      Subjective Assessment - 01/04/16 1035    Subjective Patient states her thoracic region feels a little better today, but reports minor increased pain when performing her shoulder squeezes.   Pertinent History History of Fibromyalgia; Pain stated "over 10 years ago" and had a recent aggravation of symptoms "sometime last year".    Limitations Sitting   Diagnostic tests X-Ray: Positive for previous compression fracture (per pt report)   Patient Stated Goals To decrease the mid back pain.    Currently in Pain? Yes   Pain Score 4    Pain Location Back   Pain Orientation Upper;Mid;Right;Left   Pain Descriptors / Indicators Aching;Sharp   Pain Type Chronic pain;Acute pain   Pain Onset More than a month ago      Objective: Observation: Sitting posture: slight forward head posture with forwarded rounded shoulders. Bilateral abducted scapula resting position. Elevated scapula resting position on right versus left.  Palpation: Increased tenderness to palpation along thoracic extensors, upper traps, and lower traps,   Measurement:  Thoracic AROM/MMT: Flexion: WNL -- pain at end range and when arising to neutral, Extension: Decreased by 50% , Left lateral flexion: WNL pain at ER, Right lateral flexion: WNL, Right rotation: WNL, Left rotation: WNL Shoulder AROM/MMT: Flexion: WNL, ABD: Pain at 90 degrees reported in thoracic spine; Reaching behind head: upper thoracic, Reaching behind back: mid thoracic Cervical AROM/MMT: Flexion: 45--slight increase in pain, Extension: 30, Left lateral flexion: 25-- slight increase in pain, Right lateral flexion: 30, right rotation: 55, left rotation: 55,     Therapeutic Exercise: Patient performed exercises with guidance, verbal and tactile cues and demonstration of therapist: Scapular retraction in prone -- x 20 (AAROM at end range) Serratus Punches in supine -- 2 x 10  Thoracic  extension in sitting -- x 10 Side lying scapular elevation/depression -- x20 Standing shoulder ER with yellow tubing -- x8 (stopped due to increased pain)  Manual Therapy:  STM to upper traps and thoracic extensors utilizing deep and superficial techniques to decrease muscle spasms and guarding with the patient positioned in prone.  Patient response to treatment: Patient demonstrates increased upper  trap activation during serratus punches exercise and required tactile and verbal cueing to correct. Decreased pain to upper trap and thoracic region after manual therapy and assisted scapular retraction in prone.          PT Education - 01/04/16 1526    Education provided Yes   Education Details HEP: Serratus punches in supine, scapular elevation/depression   Person(s) Educated Patient   Methods Explanation   Comprehension Verbalized understanding             PT Long Term Goals - 01/02/16 1110    PT LONG TERM GOAL #1   Title Pt will score under 20% on the modified oswestry by 02/13/16 to demonstrate significant improvement in mid/low back function and improved ability to perform lifting activities.   Baseline Modified Oswestry: 42%   Status New   PT LONG TERM GOAL #2   Title Pt will be independent with HEP performance and progression aimed towards improving thoracic muscular endurance, coordination, and strength by 02/13/16 to continue improvements after discharge from therapy.   Baseline Dependent with exercises performance and progression   Status New   PT LONG TERM GOAL #3   Title Patient will be able to sit for over 1 hour without experiencing thoracic spine pain by 02/13/16 to better be able to write at home without onset of pain.    Baseline Pain is significantly increased after 1 hour.    Status New               Plan - 01/04/16 1747    Clinical Impression Statement Patient demonstrates decreased motor control with scapular movements and requires frequent tactile cueing to perform exercises. Improved thoracic mobility today indicating funcitonal carryover between visits and patient will benefit from further skilled therapy aimed at improving muscular endurance/coordination to return to prior level of function.    Rehab Potential Good   Clinical Impairments Affecting Rehab Potential (-): age, fibromyalgia; (+): family support, highly motivated.   PT Frequency 2x /  week   PT Duration 6 weeks   PT Treatment/Interventions Electrical Stimulation;Iontophoresis 4mg /ml Dexamethasone;Moist Heat;Therapeutic activities;Therapeutic exercise;Ultrasound;Neuromuscular re-education;Patient/family education;Manual techniques;Passive range of motion   PT Next Visit Plan Screen cervical spine, scapular retractions   PT Home Exercise Plan thoracic ext,    Consulted and Agree with Plan of Care Patient      Patient will benefit from skilled therapeutic intervention in order to improve the following deficits and impairments:  Decreased strength, Postural dysfunction, Decreased mobility, Hypomobility, Pain, Decreased endurance, Increased muscle spasms, Decreased range of motion, Decreased coordination  Visit Diagnosis: Pain in thoracic spine  Muscle weakness (generalized)     Problem List Patient Active Problem List   Diagnosis Date Noted  . Rash and nonspecific skin eruption 10/03/2015  . Acute sinusitis 09/07/2015  . Lumbar compression fracture ([place]) 06/20/2015  . Increased pressure in the eye 06/02/2014  . Encounter for Medicare annual wellness exam 05/13/2013  . Fibromyalgia 02/26/2013  . Allergic rhinitis due to pollen 11/20/2010  . 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 IRRITABLE BOWEL SYNDROME 04/06/2010  . THYROID NODULE, RIGHT 12/07/2009  . OSTEOARTHRITIS 11/28/2009  .  FIBROIDS, UTERUS 05/10/2008  . Insomnia 05/10/2008  . ASYMPTOMATIC POSTMENOPAUSAL STATUS 05/10/2008  . Essential hypertension 01/28/2008  . HYPERCHOLESTEROLEMIA 01/07/2008  . GLAUCOMA 01/07/2008  . 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 GERD 04/14/2007  . Osteoporosis 04/14/2007  . History of colonic polyps 04/14/2007    Blythe Stanford, SPT 01/05/2016, 9:06 AM  [place] [facility] [address], [place], [address] Phone: [phone]   Fax:  [phone]  Name: COCO MENZIE MRN: [id number] Date of Birth:  07-15-1946

## 2016-01-05 ENCOUNTER — Ambulatory Visit (HOSPITAL_COMMUNITY): Payer: Medicare Other

## 2016-01-09 ENCOUNTER — Encounter: Payer: Self-pay | Admitting: Physical Therapy

## 2016-01-09 ENCOUNTER — Encounter: Payer: Medicare Other | Admitting: Physical Therapy

## 2016-01-09 ENCOUNTER — Ambulatory Visit (INDEPENDENT_AMBULATORY_CARE_PROVIDER_SITE_OTHER): Payer: Medicare Other | Admitting: *Deleted

## 2016-01-09 ENCOUNTER — Ambulatory Visit (HOSPITAL_COMMUNITY)
Admission: RE | Admit: 2016-01-09 | Discharge: 2016-01-09 | Disposition: A | Discharge status: Home / Self Care | Payer: Medicare Other | Source: Ambulatory Visit | Attending: Gastroenterology | Admitting: Gastroenterology

## 2016-01-09 ENCOUNTER — Ambulatory Visit: Payer: Medicare Other | Attending: Rheumatology | Admitting: Physical Therapy

## 2016-01-09 DIAGNOSIS — R1310 Dysphagia, unspecified: Secondary | ICD-10-CM | POA: Diagnosis not present

## 2016-01-09 DIAGNOSIS — M6281 Muscle weakness (generalized): Secondary | ICD-10-CM | POA: Insufficient documentation

## 2016-01-09 DIAGNOSIS — R490 Dysphonia: Secondary | ICD-10-CM | POA: Diagnosis not present

## 2016-01-09 DIAGNOSIS — M546 Pain in thoracic spine: Secondary | ICD-10-CM | POA: Diagnosis not present

## 2016-01-09 DIAGNOSIS — R1314 Dysphagia, pharyngoesophageal phase: Secondary | ICD-10-CM

## 2016-01-09 DIAGNOSIS — J029 Acute pharyngitis, unspecified: Secondary | ICD-10-CM | POA: Insufficient documentation

## 2016-01-09 DIAGNOSIS — J309 Allergic rhinitis, unspecified: Secondary | ICD-10-CM

## 2016-01-09 DIAGNOSIS — K219 Gastro-esophageal reflux disease without esophagitis: Secondary | ICD-10-CM | POA: Diagnosis not present

## 2016-01-09 DIAGNOSIS — R079 Chest pain, unspecified: Secondary | ICD-10-CM

## 2016-01-09 NOTE — Therapy (Signed)
[place] [facility] [address] [place], [address] Phone: [phone]   Fax:  [phone]  Physical Therapy Treatment  Patient Details  Name: Samantha [name] MRN: [id number] Date of Birth: 05/14/1946 Referring Provider: Cy Blamer, MD  Encounter Date: 01/09/2016      PT End of Session - 01/09/16 1747    Visit Number 3   Number of Visits 12   Date for PT Re-Evaluation 02/13/16   Authorization Type 3   Authorization Time Period 10 (G code)   PT Start Time 1610   PT Stop Time 1650   PT Time Calculation (min) 40 min   Activity Tolerance Patient tolerated treatment well   Behavior During Therapy [facility] for tasks assessed/performed      Past Medical History  Diagnosis Date  . ASTHMA 04/14/2007  . HYPERCHOLESTEROLEMIA 01/07/2008  . HYPERTENSION 01/28/2008  . SINUSITIS 02/10/2009  . ALLERGIC RHINITIS 07/26/2008  . INSOMNIA 05/10/2008  . FIBROIDS, UTERUS 05/10/2008  . OSTEOPOROSIS 04/14/2007  . OSTEOARTHRITIS 11/28/2009  . GERD 04/14/2007  . Irritable bowel syndrome 04/06/2010  . CHEST PAIN 08/26/2008  . HYPERGLYCEMIA 11/28/2009  . 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 ASYMPTOMATIC POSTMENOPAUSAL STATUS 05/10/2008  . 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 Episcleritis   . Fibromyalgia     Past Surgical History  Procedure Laterality Date  . Nasal polyp surgery      Polypectomy and septoplasty  . Laparoscopy    . Neck injury  1983  . Stress cardiolite  02/13/2002  . Electrocardiogram  12/04/2006  . Colonoscopy w/ polypectomy      There were no vitals filed for this visit.      Subjective Assessment - 01/09/16 1613    Subjective Patient states she was in a lot of pain on Saturday but is better today. States she did not sleep well last night.   Pertinent History History of Fibromyalgia; Pain stated "over 10 years ago" and had a recent aggravation of symptoms "sometime last year".   Limitations Sitting    Diagnostic tests X-Ray: Positive for previous compression fracture (per pt report)   Patient Stated Goals To decrease the mid back pain.    Currently in Pain? Yes   Pain Score 4    Pain Location Back   Pain Orientation Upper;Mid   Pain Descriptors / Indicators Aching   Pain Type Chronic pain   Pain Onset More than a month ago      Objective: Observation: Sitting posture: slight forward head posture with forwarded rounded shoulders. Bilateral abducted scapula resting position. Elevated scapula resting position on right versus left.  Palpation: Increased tenderness to palpation along thoracic extensors, upper traps, and lower traps,   Measurement:  Thoracic AROM/MMT: Flexion: WNL -- pain at end range and when arising to neutral, Extension: Decreased by 50% , Left lateral flexion: WNL pain at ER, Right lateral flexion: WNL, Right rotation: WNL, Left rotation: WNL Shoulder AROM/MMT: Flexion: WNL, ABD: Pain at 90 degrees reported in thoracic spine; Reaching behind head: upper thoracic, Reaching behind back: mid thoracic Cervical AROM/MMT: Flexion: 45--slight increase in pain, Extension: 30, Left lateral flexion: 25-- slight increase in pain, Right lateral flexion: 30, right rotation: 55, left rotation: 55,   Therapeutic Exercise: Patient performed exercises with guidance, verbal and tactile cues and demonstration of therapist: Scapular retraction in massage chair-- x 15 (AAROM at end range) Thoracic rotation in sitting -- x 10 bilaterally (More discomfort  to the Right versus left) Thoracic side bending -- x 10 bilaterally (More discomfort to the Right versus left) Multifidus Crunch -- x 10 Straight arm pull downs at [place] in standing -- x 12 #10 Reverse pull ups in sitting at [place] in sitting -- x 12 #10  Manual Therapy:  STM to upper traps and thoracic extensors utilizing deep and superficial techniques to decrease muscle spasms and guarding with the patient positioned in massage  chair.  Patient response to treatment: Patient demonstrates increased end range pain with thoracic side bending/rotation indicating decreased motor control. Decreased pain to thoracic/cervical region after performing STM to the thoracic extensors and upper traps.         PT Education - 01/09/16 1746    Education provided Yes   Education Details Educated to maintain HEP; HEP: straight arm pull down with red band, reverse pull up with red   Person(s) Educated Patient   Methods Explanation;Demonstration   Comprehension Verbalized understanding;Returned demonstration             PT Long Term Goals - 01/02/16 1110    PT LONG TERM GOAL #1   Title Pt will score under 20% on the modified oswestry by 02/13/16 to demonstrate significant improvement in mid/low back function and improved ability to perform lifting activities.   Baseline Modified Oswestry: 42%   Status New   PT LONG TERM GOAL #2   Title Pt will be independent with HEP performance and progression aimed towards improving thoracic muscular endurance, coordination, and strength by 02/13/16 to continue improvements after discharge from therapy.   Baseline Dependent with exercises performance and progression   Status New   PT LONG TERM GOAL #3   Title Patient will be able to sit for over 1 hour without experiencing thoracic spine pain by 02/13/16 to better be able to write at home without onset of pain.    Baseline Pain is significantly increased after 1 hour.    Status New               Plan - 01/09/16 1810    Clinical Impression Statement Patient demonstrates improved ability to perform mobility and retraction exercises today indicating improved muscular coordination within the scapular stabilizers. Although patient is improving, she continues to experience decreased muscular endurance and control  with exercise performance and will benefit from further skilled therapy to return to prior level of function.    Rehab Potential  Good   Clinical Impairments Affecting Rehab Potential (-): age, fibromyalgia; (+): family support, highly motivated.   PT Frequency 2x / week   PT Duration 6 weeks   PT Treatment/Interventions Electrical Stimulation;Iontophoresis 4mg /ml Dexamethasone;Moist Heat;Therapeutic activities;Therapeutic exercise;Ultrasound;Neuromuscular re-education;Patient/family education;Manual techniques;Passive range of motion   PT Next Visit Plan Screen cervical spine, scapular retractions   PT Home Exercise Plan thoracic ext,    Consulted and Agree with Plan of Care Patient      Patient will benefit from skilled therapeutic intervention in order to improve the following deficits and impairments:  Decreased strength, Postural dysfunction, Decreased mobility, Hypomobility, Pain, Decreased endurance, Increased muscle spasms, Decreased range of motion, Decreased coordination  Visit Diagnosis: Pain in thoracic spine  Muscle weakness (generalized)     Problem List Patient Active Problem List   Diagnosis Date Noted  . Rash and nonspecific skin eruption 10/03/2015  . Acute sinusitis 09/07/2015  . Lumbar compression fracture ([place]) 06/20/2015  . Increased pressure in the eye 06/02/2014  . Encounter for Medicare annual wellness exam 05/13/2013  .  Fibromyalgia 02/26/2013  . Allergic rhinitis due to pollen 11/20/2010  . 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 IRRITABLE BOWEL SYNDROME 04/06/2010  . THYROID NODULE, RIGHT 12/07/2009  . OSTEOARTHRITIS 11/28/2009  . FIBROIDS, UTERUS 05/10/2008  . Insomnia 05/10/2008  . ASYMPTOMATIC POSTMENOPAUSAL STATUS 05/10/2008  . Essential hypertension 01/28/2008  . HYPERCHOLESTEROLEMIA 01/07/2008  . GLAUCOMA 01/07/2008  . 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 GERD 04/14/2007  . Osteoporosis 04/14/2007  . History of colonic polyps 04/14/2007    Blythe Stanford, SPT 01/10/2016, 8:15 AM  [place] [facility] [address], [place], [address] Phone: [phone]   Fax:  [phone]  Name: Samantha [name] MRN: [id number] Date of Birth: [birth year]

## 2016-01-11 ENCOUNTER — Ambulatory Visit: Payer: Medicare Other | Admitting: Physical Therapy

## 2016-01-11 ENCOUNTER — Encounter: Payer: Self-pay | Admitting: Physical Therapy

## 2016-01-11 DIAGNOSIS — M546 Pain in thoracic spine: Secondary | ICD-10-CM | POA: Diagnosis not present

## 2016-01-11 DIAGNOSIS — M6281 Muscle weakness (generalized): Secondary | ICD-10-CM

## 2016-01-11 NOTE — Therapy (Signed)
[place] [facility] [address], [place], [address] Phone: [phone]   Fax:  [phone]  Physical Therapy Treatment  Patient Details  Name: Samantha [name] MRN: [id number] Date of Birth: Feb 28, 1946 Referring Provider: Cy Blamer, MD  Encounter Date: 01/11/2016      PT End of Session - 01/11/16 1521    Visit Number 4   Number of Visits 12   Date for PT Re-Evaluation 02/13/16   Authorization Type 4   Authorization Time Period 10 (G code)   PT Start Time 0933   PT Stop Time 1014   PT Time Calculation (min) 41 min   Activity Tolerance Patient tolerated treatment well   Behavior During Therapy [facility] for tasks assessed/performed      Past Medical History  Diagnosis Date  . ASTHMA 04/14/2007  . HYPERCHOLESTEROLEMIA 01/07/2008  . HYPERTENSION 01/28/2008  . SINUSITIS 02/10/2009  . ALLERGIC RHINITIS 07/26/2008  . INSOMNIA 05/10/2008  . FIBROIDS, UTERUS 05/10/2008  . OSTEOPOROSIS 04/14/2007  . OSTEOARTHRITIS 11/28/2009  . GERD 04/14/2007  . Irritable bowel syndrome 04/06/2010  . CHEST PAIN 08/26/2008  . HYPERGLYCEMIA 11/28/2009  . 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 ASYMPTOMATIC POSTMENOPAUSAL STATUS 05/10/2008  . 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 Episcleritis   . Fibromyalgia     Past Surgical History  Procedure Laterality Date  . Nasal polyp surgery      Polypectomy and septoplasty  . Laparoscopy    . Neck injury  1983  . Stress cardiolite  02/13/2002  . Electrocardiogram  12/04/2006  . Colonoscopy w/ polypectomy      There were no vitals filed for this visit.      Subjective Assessment - 01/11/16 0933    Subjective Patient states increased pain in the mid back after playing with her grandson yesterday. States she better the after last treatment session.    Pertinent History History of Fibromyalgia; Pain stated "over 10 years ago" and had a recent aggravation of symptoms "sometime last  year".   Limitations Sitting   Diagnostic tests X-Ray: Positive for previous compression fracture (per pt report)   Patient Stated Goals To decrease the mid back pain.    Currently in Pain? Yes   Pain Score 3    Pain Location Back   Pain Orientation Upper;Mid   Pain Descriptors / Indicators Aching   Pain Type Chronic pain   Pain Onset More than a month ago      Objective: Observation: Sitting posture: slight forward head posture with forwarded rounded shoulders. Elevated scapula resting position on right versus left.  Palpation: Increased tenderness to palpation along thoracic extensors, upper traps, and lower traps,   Measurement:  Thoracic AROM/MMT: Flexion: WNL -- pain at end range and when arising to neutral, Extension: Decreased by 50% , Left lateral flexion: WNL pain at ER, Right lateral flexion: WNL, Right rotation: WNL, Left rotation: WNL Shoulder AROM/MMT: Flexion: WNL, ABD: Pain at 90 degrees reported in thoracic spine; Reaching behind head: upper thoracic, Reaching behind back: mid thoracic Cervical AROM/MMT: Flexion: 45--slight increase in pain, Extension: 30, Left lateral flexion: 25-- slight increase in pain, Right lateral flexion: 30, right rotation: 55, left rotation: 55,   Therapeutic Exercise: Patient performed exercises with guidance, verbal and tactile cues and demonstration of therapist:  Straight arm pull downs at [place] in standing -- 2 x 15 #7 Scapular retraction at [place] -- x20 #5 Serratus punches in supine --  x15, x 15 3# Scapular retractions in supine -- x 15 Upper trap manual stretch in sidelying-- 3 x 20 secs Scapular elevation/depression in sidelying --x 15  Educated and demonstrated on appropriate form and steps when transferring sitting <-> supine.  Manual Therapy:  STM to upper traps and thoracic extensors utilizing deep and superficial techniques to decrease muscle spasms and guarding with the patient positioned in massage chair.  Patient  response to treatment: Decreased spasms by 33% after performing manual therapy to thoracic extensors/ upper traps and patient's pain significantly decreased after performing manual therapy and therapeutic exercise. Good demonstration of muscular activation when performing sidelying scapular depression requiring minimal cueing to perform with proper technique/form.         PT Education - 01/11/16 1520    Education provided Yes   Education Details HEP: scapular retraction at [employer] (Performed with red theraband at home)   Person(s) Educated Patient   Methods Explanation;Demonstration   Comprehension Verbalized understanding;Returned demonstration             PT Long Term Goals - 01/02/16 1110    PT LONG TERM GOAL #1   Title Pt will score under 20% on the modified oswestry by 02/13/16 to demonstrate significant improvement in mid/low back function and improved ability to perform lifting activities.   Baseline Modified Oswestry: 42%   Status New   PT LONG TERM GOAL #2   Title Pt will be independent with HEP performance and progression aimed towards improving thoracic muscular endurance, coordination, and strength by 02/13/16 to continue improvements after discharge from therapy.   Baseline Dependent with exercises performance and progression   Status New   PT LONG TERM GOAL #3   Title Patient will be able to sit for over 1 hour without experiencing thoracic spine pain by 02/13/16 to better be able to write at home without onset of pain.    Baseline Pain is significantly increased after 1 hour.    Status New               Plan - 01/11/16 1525    Clinical Impression Statement Patient continues to demonstrate improved ability to perfrom scapular retraction indicating improved motor control and functional carryover between visits. Patient demonstrates decreased muscular endurance with exercise and will benefit from further skilled therapy to return to prior level of function.    Rehab  Potential Good   Clinical Impairments Affecting Rehab Potential (-): age, fibromyalgia; (+): family support, highly motivated.   PT Frequency 2x / week   PT Duration 6 weeks   PT Treatment/Interventions Electrical Stimulation;Iontophoresis 4mg /ml Dexamethasone;Moist Heat;Therapeutic activities;Therapeutic exercise;Ultrasound;Neuromuscular re-education;Patient/family education;Manual techniques;Passive range of motion   PT Next Visit Plan Screen cervical spine, scapular retractions   PT Home Exercise Plan thoracic ext,    Consulted and Agree with Plan of Care Patient      Patient will benefit from skilled therapeutic intervention in order to improve the following deficits and impairments:  Decreased strength, Postural dysfunction, Decreased mobility, Hypomobility, Pain, Decreased endurance, Increased muscle spasms, Decreased range of motion, Decreased coordination  Visit Diagnosis: Pain in thoracic spine  Muscle weakness (generalized)     Problem List Patient Active Problem List   Diagnosis Date Noted  . Rash and nonspecific skin eruption 10/03/2015  . Acute sinusitis 09/07/2015  . Lumbar compression fracture ([place]) 06/20/2015  . Increased pressure in the eye 06/02/2014  . Encounter for Medicare annual wellness exam 05/13/2013  . Fibromyalgia 02/26/2013  . Allergic rhinitis due to pollen  11/20/2010  . 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 IRRITABLE BOWEL SYNDROME 04/06/2010  . THYROID NODULE, RIGHT 12/07/2009  . OSTEOARTHRITIS 11/28/2009  . FIBROIDS, UTERUS 05/10/2008  . Insomnia 05/10/2008  . ASYMPTOMATIC POSTMENOPAUSAL STATUS 05/10/2008  . Essential hypertension 01/28/2008  . HYPERCHOLESTEROLEMIA 01/07/2008  . GLAUCOMA 01/07/2008  . 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 GERD 04/14/2007  . Osteoporosis 04/14/2007  . History of colonic polyps 04/14/2007    Blythe Stanford, SPT 01/11/2016, 3:29 PM  [place] [facility] [address], [place], [address] Phone: [phone]   Fax:  [phone]  Name: Samantha [name] MRN: [id number] Date of Birth: 1946-05-05

## 2016-01-16 ENCOUNTER — Ambulatory Visit: Payer: Medicare Other | Admitting: Physical Therapy

## 2016-01-16 ENCOUNTER — Encounter: Payer: Self-pay | Admitting: Physical Therapy

## 2016-01-16 DIAGNOSIS — M6281 Muscle weakness (generalized): Secondary | ICD-10-CM

## 2016-01-16 DIAGNOSIS — M546 Pain in thoracic spine: Secondary | ICD-10-CM

## 2016-01-16 NOTE — Therapy (Signed)
[place] [facility] [address] [place], [address] Phone: [phone]   Fax:  [phone]  Physical Therapy Treatment  Patient Details  Name: Samantha [name] MRN: [id number] Date of Birth: 01/27/1946 Referring Provider: Cy Blamer, MD  Encounter Date: 01/16/2016      PT End of Session - 01/16/16 1818    Visit Number 5   Number of Visits 12   Date for PT Re-Evaluation 02/13/16   Authorization Type 5   Authorization Time Period 10 (G code)   PT Start Time [phone]   PT Stop Time 1029   PT Time Calculation (min) 42 min   Activity Tolerance Patient tolerated treatment well   Behavior During Therapy [facility] for tasks assessed/performed      Past Medical History  Diagnosis Date  . ASTHMA 04/14/2007  . HYPERCHOLESTEROLEMIA 01/07/2008  . HYPERTENSION 01/28/2008  . SINUSITIS 02/10/2009  . ALLERGIC RHINITIS 07/26/2008  . INSOMNIA 05/10/2008  . FIBROIDS, UTERUS 05/10/2008  . OSTEOPOROSIS 04/14/2007  . OSTEOARTHRITIS 11/28/2009  . GERD 04/14/2007  . Irritable bowel syndrome 04/06/2010  . CHEST PAIN 08/26/2008  . HYPERGLYCEMIA 11/28/2009  . 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 ASYMPTOMATIC POSTMENOPAUSAL STATUS 05/10/2008  . 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 Episcleritis   . Fibromyalgia     Past Surgical History  Procedure Laterality Date  . Nasal polyp surgery      Polypectomy and septoplasty  . Laparoscopy    . Neck injury  1983  . Stress cardiolite  02/13/2002  . Electrocardiogram  12/04/2006  . Colonoscopy w/ polypectomy      There were no vitals filed for this visit.      Subjective Assessment - 01/16/16 0948    Subjective Patient states she had increased pain over the weekend from increased cleaning which resolved the following day. States her back is starting to feel better.    Pertinent History History of Fibromyalgia; Pain stated "over 10 years ago" and had a recent aggravation of symptoms  "sometime last year".   Limitations Sitting   Diagnostic tests X-Ray: Positive for previous compression fracture (per pt report)   Patient Stated Goals To decrease the mid back pain.    Currently in Pain? No/denies   Pain Onset More than a month ago      Objective: Observation: Sitting posture: slight forward head posture with forwarded rounded shoulders. Elevated scapula resting position on right versus left.  Palpation: Increased tenderness to palpation along thoracic extensors, upper traps, and lower traps,   Measurement:  Thoracic AROM/MMT: Flexion: WNL -- pain when arising to neutral, Extension: Decreased by 25% , Left lateral flexion: WNL pain at ER, Right lateral flexion: WNL, Right rotation: WNL, Left rotation: WNL pain at end range   Therapeutic Exercise: Patient performed exercises with guidance, verbal and tactile cues and demonstration of therapist:  Straight arm pull downs at [place] in standing -- 2 x 15 #7 Scapular retraction at [place] -- x20 #5 Scapular depression at [facility]-- x15, x 15 3# on the 5# tricpe extension behind the head in sittin -- #3 Bilateral shoulder flexion with yellow ball -- x 15  Thoracic Extension, Side bending to the left, rotation to the left -- x 10 -- stopped side bending due to increased onset of symptoms Multifidus Crunch in sitting -- x 10  Manual Therapy:  STM to upper traps and thoracic extensors utilizing deep and superficial techniques to decrease muscle spasms  and guarding with the patient positioned in massage chair.  Patient response to treatment: Decreased spasms by 50% after performing manual therapy to thoracic extensors/ upper traps and patient's pain significantly decreased after treatment. Good demonstration of muscular activation when performing scapular retraction at [facility] requiring minimal cueing to perform with proper technique/form.           PT Education - 01/16/16 1811    Education provided Yes   Education Details  HEP: Straight arm pull downs   Person(s) Educated Patient   Methods Demonstration;Explanation   Comprehension Verbalized understanding;Returned demonstration             PT Long Term Goals - 01/02/16 1110    PT LONG TERM GOAL #1   Title Pt will score under 20% on the modified oswestry by 02/13/16 to demonstrate significant improvement in mid/low back function and improved ability to perform lifting activities.   Baseline Modified Oswestry: 42%   Status New   PT LONG TERM GOAL #2   Title Pt will be independent with HEP performance and progression aimed towards improving thoracic muscular endurance, coordination, and strength by 02/13/16 to continue improvements after discharge from therapy.   Baseline Dependent with exercises performance and progression   Status New   PT LONG TERM GOAL #3   Title Patient will be able to sit for over 1 hour without experiencing thoracic spine pain by 02/13/16 to better be able to write at home without onset of pain.    Baseline Pain is significantly increased after 1 hour.    Status New               Plan - 01/16/16 1826    Clinical Impression Statement Patient demonstrates improved exercise performance today indicating increased muscular coordination compared to previous visits. Decreased resting pain today indicating functional carryover between visits. Although patient is improving she continues to experience pain with thoracic movements indicating decreased motor control  and patient will benefit from further skilled therapy to return to prior level of function.    Rehab Potential Good   Clinical Impairments Affecting Rehab Potential (-): age, fibromyalgia; (+): family support, highly motivated.   PT Frequency 2x / week   PT Duration 6 weeks   PT Treatment/Interventions Electrical Stimulation;Iontophoresis 4mg /ml Dexamethasone;Moist Heat;Therapeutic activities;Therapeutic exercise;Ultrasound;Neuromuscular re-education;Patient/family  education;Manual techniques;Passive range of motion   PT Next Visit Plan Screen cervical spine, scapular retractions   PT Home Exercise Plan thoracic ext,    Consulted and Agree with Plan of Care Patient      Patient will benefit from skilled therapeutic intervention in order to improve the following deficits and impairments:  Decreased strength, Postural dysfunction, Decreased mobility, Hypomobility, Pain, Decreased endurance, Increased muscle spasms, Decreased range of motion, Decreased coordination  Visit Diagnosis: Pain in thoracic spine  Muscle weakness (generalized)     Problem List Patient Active Problem List   Diagnosis Date Noted  . Rash and nonspecific skin eruption 10/03/2015  . Acute sinusitis 09/07/2015  . Lumbar compression fracture ([place]) 06/20/2015  . Increased pressure in the eye 06/02/2014  . Encounter for Medicare annual wellness exam 05/13/2013  . Fibromyalgia 02/26/2013  . Allergic rhinitis due to pollen 11/20/2010  . 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 IRRITABLE BOWEL SYNDROME 04/06/2010  . THYROID NODULE, RIGHT 12/07/2009  . OSTEOARTHRITIS 11/28/2009  . FIBROIDS, UTERUS 05/10/2008  . Insomnia 05/10/2008  . ASYMPTOMATIC POSTMENOPAUSAL STATUS 05/10/2008  . Essential hypertension 01/28/2008  . HYPERCHOLESTEROLEMIA 01/07/2008  . GLAUCOMA 01/07/2008  . Allergic-infective asthma  04/14/2007  . GERD 04/14/2007  . Osteoporosis 04/14/2007  . History of colonic polyps 04/14/2007    Blythe Stanford, SPT 01/17/2016, 8:29 AM  [place] [address] [place], [address] Phone: [phone]   Fax:  [phone]  Name: ZOELLA HAMMAKER MRN: [id number] Date of Birth: May 30, 1946

## 2016-01-17 ENCOUNTER — Encounter: Payer: Medicare Other | Admitting: Physical Therapy

## 2016-01-18 ENCOUNTER — Encounter: Payer: Self-pay | Admitting: Physical Therapy

## 2016-01-18 ENCOUNTER — Ambulatory Visit (INDEPENDENT_AMBULATORY_CARE_PROVIDER_SITE_OTHER): Payer: Medicare Other | Admitting: *Deleted

## 2016-01-18 ENCOUNTER — Ambulatory Visit: Payer: Medicare Other | Admitting: Physical Therapy

## 2016-01-18 DIAGNOSIS — M6281 Muscle weakness (generalized): Secondary | ICD-10-CM

## 2016-01-18 DIAGNOSIS — J3089 Other allergic rhinitis: Secondary | ICD-10-CM | POA: Insufficient documentation

## 2016-01-18 DIAGNOSIS — M546 Pain in thoracic spine: Secondary | ICD-10-CM | POA: Diagnosis not present

## 2016-01-18 DIAGNOSIS — J302 Other seasonal allergic rhinitis: Secondary | ICD-10-CM | POA: Insufficient documentation

## 2016-01-18 DIAGNOSIS — J309 Allergic rhinitis, unspecified: Secondary | ICD-10-CM | POA: Diagnosis not present

## 2016-01-18 NOTE — Therapy (Signed)
[place] [facility] [address], [place], [address] Phone: [phone]   Fax:  [phone]  Physical Therapy Treatment  Patient Details  Name: Samantha [name] MRN: [id number] Date of Birth: 12/27/1945 Referring Provider: Cy Blamer, MD  Encounter Date: 01/18/2016      PT End of Session - 01/18/16 1631    Visit Number 6   Number of Visits 12   Date for PT Re-Evaluation 02/13/16   Authorization Type 6   Authorization Time Period 10 (G code)   PT Start Time 1522   PT Stop Time 1620   PT Time Calculation (min) 58 min   Activity Tolerance Patient tolerated treatment well   Behavior During Therapy [facility] for tasks assessed/performed      Past Medical History  Diagnosis Date  . ASTHMA 04/14/2007  . HYPERCHOLESTEROLEMIA 01/07/2008  . HYPERTENSION 01/28/2008  . SINUSITIS 02/10/2009  . ALLERGIC RHINITIS 07/26/2008  . INSOMNIA 05/10/2008  . FIBROIDS, UTERUS 05/10/2008  . OSTEOPOROSIS 04/14/2007  . OSTEOARTHRITIS 11/28/2009  . GERD 04/14/2007  . Irritable bowel syndrome 04/06/2010  . CHEST PAIN 08/26/2008  . HYPERGLYCEMIA 11/28/2009  . 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 ASYMPTOMATIC POSTMENOPAUSAL STATUS 05/10/2008  . 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 Episcleritis   . Fibromyalgia     Past Surgical History  Procedure Laterality Date  . Nasal polyp surgery      Polypectomy and septoplasty  . Laparoscopy    . Neck injury  1983  . Stress cardiolite  02/13/2002  . Electrocardiogram  12/04/2006  . Colonoscopy w/ polypectomy      There were no vitals filed for this visit.      Subjective Assessment - 01/18/16 1524    Subjective Patient reports she went shopping after leaving therapy the other day and was sore afterwards. she self treated with Tylenol and felt better. She is exercising with AROM thoracic extension and this seems to be very helpful.    Patient Stated Goals To decrease the mid back  pain.    Currently in Pain? No/denies      Objective: Observation: Sitting posture: slight forward head posture with forwarded rounded shoulders. Slight winging right scapula as compared to left Palpation: Increased tenderness to palpation along thoracic extensors, upper traps, and lower traps bilaterally   Treatment:   Therapeutic Exercise: Patient performed exercises with guidance, verbal and tactile cues and demonstration of therapist:  Straight arm pull downs at [place] in standing -- 1 x 15 #10 Scapular retraction at [place] -- x 15 #5 Scapular depression at [facility]-- x 15, x 15 3# on the 5# tricpe extension behind the head in sittin -- #3 with patient seated on stability ball with close supervision/guarding Thoracic Extension in sitting on treatment table and with patient in straight back chair with towel rolled up behind at various levels; scapular retraction in chair with towel roll vertically along thoracic spiine x 10 reps Standing at wall; push ups x 10 following demonstration and with tactile/verbal cuing Multifidus Crunch in sitting -- x 10 Sitting on treatment table: RS for trunk flexion/extension and rotations 5 second holds x 10 reps each  Manual Therapy:  STM to upper traps and thoracic extensors utilizing deep and superficial techniques to decrease muscle spasms and guarding with the patient positioned in massage chair.  Moist heat applied to both upper traps/mid back with patient seated at end of session x 15 min. For pain/reduction of  spasms: no adverse skin reaction noted  Patient response to treatment: Decreased spasms by 50% after performing manual therapy to thoracic extensors/ upper traps and patient's pain significantly decreased after treatment. Good demonstration of muscular activation when performing scapular retraction at [facility] requiring minimal cueing to perform with proper technique/form. Decreased soreness in upper back following moist heat at end of  session.          PT Education - 01/18/16 1630    Education provided Yes   Education Details HEP: wall push ups, bilateral flexion overhead with 3#   Person(s) Educated Patient   Methods Explanation;Demonstration;Verbal cues;Handout   Comprehension Verbalized understanding;Returned demonstration;Verbal cues required             PT Long Term Goals - 01/02/16 1110    PT LONG TERM GOAL #1   Title Pt will score under 20% on the modified oswestry by 02/13/16 to demonstrate significant improvement in mid/low back function and improved ability to perform lifting activities.   Baseline Modified Oswestry: 42%   Status New   PT LONG TERM GOAL #2   Title Pt will be independent with HEP performance and progression aimed towards improving thoracic muscular endurance, coordination, and strength by 02/13/16 to continue improvements after discharge from therapy.   Baseline Dependent with exercises performance and progression   Status New   PT LONG TERM GOAL #3   Title Patient will be able to sit for over 1 hour without experiencing thoracic spine pain by 02/13/16 to better be able to write at home without onset of pain.    Baseline Pain is significantly increased after 1 hour.    Status New               Plan - 01/18/16 1632    Clinical Impression Statement Patient is progressing well with all goals with decreasiing pain in mid back and decreasing spasms which allows her to progress exercises for strengthening and motor control. she continues to require guidance and verbal cues to perform exercises with good technique and appropriate intensity.    Rehab Potential Good   PT Frequency 2x / week   PT Duration 6 weeks   PT Treatment/Interventions Electrical Stimulation;Iontophoresis 4mg /ml Dexamethasone;Moist Heat;Therapeutic activities;Therapeutic exercise;Ultrasound;Neuromuscular re-education;Patient/family education;Manual techniques;Passive range of motion   PT Next Visit Plan manual  therapy, progressive exercises for strenth and flexibility, stabilization   PT Home Exercise Plan continue as instructed, added wall push ups x 10 1x/day      Patient will benefit from skilled therapeutic intervention in order to improve the following deficits and impairments:  Decreased strength, Postural dysfunction, Decreased mobility, Hypomobility, Pain, Decreased endurance, Increased muscle spasms, Decreased range of motion, Decreased coordination  Visit Diagnosis: Pain in thoracic spine  Muscle weakness (generalized)     Problem List Patient Active Problem List   Diagnosis Date Noted  . Rhinitis, allergic 01/18/2016  . Rash and nonspecific skin eruption 10/03/2015  . Acute sinusitis 09/07/2015  . Lumbar compression fracture ([place]) 06/20/2015  . Increased pressure in the eye 06/02/2014  . Encounter for Medicare annual wellness exam 05/13/2013  . Fibromyalgia 02/26/2013  . Allergic rhinitis due to pollen 11/20/2010  . 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 IRRITABLE BOWEL SYNDROME 04/06/2010  . THYROID NODULE, RIGHT 12/07/2009  . OSTEOARTHRITIS 11/28/2009  . FIBROIDS, UTERUS 05/10/2008  . Insomnia 05/10/2008  . ASYMPTOMATIC POSTMENOPAUSAL STATUS 05/10/2008  . Essential hypertension 01/28/2008  . HYPERCHOLESTEROLEMIA 01/07/2008  . GLAUCOMA 01/07/2008  . 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 GERD 04/14/2007  .  Osteoporosis 04/14/2007  . History of colonic polyps 04/14/2007    Jomarie Longs PT 01/18/2016, 5:36 PM  [place] [facility] [address], [place], [address] Phone: [phone]   Fax:  [phone]  Name: Samantha [name] MRN: [id number] Date of Birth: [birth year]

## 2016-01-23 ENCOUNTER — Ambulatory Visit (INDEPENDENT_AMBULATORY_CARE_PROVIDER_SITE_OTHER): Payer: Medicare Other | Admitting: *Deleted

## 2016-01-23 ENCOUNTER — Ambulatory Visit: Payer: Medicare Other | Admitting: Physical Therapy

## 2016-01-23 ENCOUNTER — Encounter: Payer: Self-pay | Admitting: Physical Therapy

## 2016-01-23 DIAGNOSIS — J309 Allergic rhinitis, unspecified: Secondary | ICD-10-CM

## 2016-01-23 DIAGNOSIS — M546 Pain in thoracic spine: Secondary | ICD-10-CM | POA: Diagnosis not present

## 2016-01-23 DIAGNOSIS — M6281 Muscle weakness (generalized): Secondary | ICD-10-CM

## 2016-01-23 NOTE — Therapy (Signed)
[place] [facility] [address], [place], [address] Phone: [phone]   Fax:  [phone]  Physical Therapy Treatment  Patient Details  Name: Samantha [name] MRN: [id number] Date of Birth: 11/06/1945 Referring Provider: Cy Blamer, MD  Encounter Date: 01/23/2016      PT End of Session - 01/23/16 1518    Visit Number 7   Number of Visits 12   Date for PT Re-Evaluation 02/13/16   Authorization Type 7   Authorization Time Period 10 (G code)   PT Start Time 1030   PT Stop Time 1120   PT Time Calculation (min) 50 min   Activity Tolerance Patient tolerated treatment well   Behavior During Therapy [facility] for tasks assessed/performed      Past Medical History  Diagnosis Date  . ASTHMA 04/14/2007  . HYPERCHOLESTEROLEMIA 01/07/2008  . HYPERTENSION 01/28/2008  . SINUSITIS 02/10/2009  . ALLERGIC RHINITIS 07/26/2008  . INSOMNIA 05/10/2008  . FIBROIDS, UTERUS 05/10/2008  . OSTEOPOROSIS 04/14/2007  . OSTEOARTHRITIS 11/28/2009  . GERD 04/14/2007  . Irritable bowel syndrome 04/06/2010  . CHEST PAIN 08/26/2008  . HYPERGLYCEMIA 11/28/2009  . 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 ASYMPTOMATIC POSTMENOPAUSAL STATUS 05/10/2008  . 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 Episcleritis   . Fibromyalgia     Past Surgical History  Procedure Laterality Date  . Nasal polyp surgery      Polypectomy and septoplasty  . Laparoscopy    . Neck injury  1983  . Stress cardiolite  02/13/2002  . Electrocardiogram  12/04/2006  . Colonoscopy w/ polypectomy      There were no vitals filed for this visit.      Subjective Assessment - 01/23/16 1515    Subjective Patient reports aggravation of symptoms the previous Saturday and ultilized her HEP to decrease her pain. Patient reports she feels the exercises are helping. No problems reported following previous session.    Patient Stated Goals To decrease the mid back pain.    Currently  in Pain? Yes   Pain Score 5    Pain Location Back   Pain Orientation Mid;Upper   Pain Descriptors / Indicators Aching   Pain Type Chronic pain      Objective: Observation: Sitting posture: slight forward head posture with forwarded rounded shoulders. Slight winging right scapula as compared to left Palpation: Increased tenderness to palpation along thoracic extensors, upper traps, and lower traps bilaterally   Treatment:   Therapeutic Exercise: Patient performed exercises with guidance, verbal and tactile cues and demonstration of therapist:  Straight arm pull downs at [place] in standing -- 1 x 15 #10 Reverse pull up at [place] -- x5 10#; x15 15# Scapular retraction at [place] -- x 10 #5, x8 #8 Scapular depression at [facility]-- x 15, #5; x 10 with 3# in hand Punches at [facility] -- x15 #5 -- manual assist  Tricep extension behind the head in sitting (bliateral forward flexion holding weight)-- #3 with patient seated on stability ball with close supervision/guarding Thoracic Extension in sitting --  x 10 reps Multifidus Crunch in sitting -- x 10 Wall angels facing the wall -- x12 -- stopped due to [place] Sitting on treatment table: RS for trunk flexion/extension and rotations 5 second holds x 10 reps each  Manual Therapy:  STM to upper traps and thoracic extensors utilizing deep and superficial techniques to decrease muscle spasms and guarding with the patient positioned in massage chair.  Moist heat applied to both upper traps/mid back with patient seated at end of session x 10 min. For pain/reduction of spasms: no adverse skin reaction noted  Patient response to treatment: Decreased spasms by 33% after performing manual therapy to thoracic extensors/ upper traps and patient's pain decreased after treatment. Good demonstration of muscular activation when performing scapular depression with 3# weight in hand requiring minimal tactile to perform with proper technique/form.            PT Education - 01/23/16 1517    Education provided Yes   Education Details HEP: wall angels when facing the wall   Person(s) Educated Patient   Methods Explanation;Demonstration   Comprehension Verbalized understanding;Returned demonstration             PT Long Term Goals - 01/02/16 1110    PT LONG TERM GOAL #1   Title Pt will score under 20% on the modified oswestry by 02/13/16 to demonstrate significant improvement in mid/low back function and improved ability to perform lifting activities.   Baseline Modified Oswestry: 42%   Status New   PT LONG TERM GOAL #2   Title Pt will be independent with HEP performance and progression aimed towards improving thoracic muscular endurance, coordination, and strength by 02/13/16 to continue improvements after discharge from therapy.   Baseline Dependent with exercises performance and progression   Status New   PT LONG TERM GOAL #3   Title Patient will be able to sit for over 1 hour without experiencing thoracic spine pain by 02/13/16 to better be able to write at home without onset of pain.    Baseline Pain is significantly increased after 1 hour.    Status New               Plan - 01/23/16 1524    Clinical Impression Statement Patient progressing well towards long term goals demonstrating decreased muscular fatigue with exercises. Although patient is improving, she continues to require frequent cueing for proper shoulder alignment during exercise indicating decreased motor control and patient will benefit from further skilled therapy to return to prior level of function.    Rehab Potential Good   PT Frequency 2x / week   PT Duration 6 weeks   PT Treatment/Interventions Electrical Stimulation;Iontophoresis 4mg /ml Dexamethasone;Moist Heat;Therapeutic activities;Therapeutic exercise;Ultrasound;Neuromuscular re-education;Patient/family education;Manual techniques;Passive range of motion   PT Next Visit Plan manual therapy, progressive  exercises for strenth and flexibility, stabilization   PT Home Exercise Plan continue as instructed, added wall push ups x 10 1x/day      Patient will benefit from skilled therapeutic intervention in order to improve the following deficits and impairments:  Decreased strength, Postural dysfunction, Decreased mobility, Hypomobility, Pain, Decreased endurance, Increased muscle spasms, Decreased range of motion, Decreased coordination  Visit Diagnosis: Pain in thoracic spine  Muscle weakness (generalized)     Problem List Patient Active Problem List   Diagnosis Date Noted  . Rhinitis, allergic 01/18/2016  . Rash and nonspecific skin eruption 10/03/2015  . Acute sinusitis 09/07/2015  . Lumbar compression fracture ([place]) 06/20/2015  . Increased pressure in the eye 06/02/2014  . Encounter for Medicare annual wellness exam 05/13/2013  . Fibromyalgia 02/26/2013  . Allergic rhinitis due to pollen 11/20/2010  . 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 IRRITABLE BOWEL SYNDROME 04/06/2010  . THYROID NODULE, RIGHT 12/07/2009  . OSTEOARTHRITIS 11/28/2009  . FIBROIDS, UTERUS 05/10/2008  . Insomnia 05/10/2008  . ASYMPTOMATIC POSTMENOPAUSAL STATUS 05/10/2008  . Essential hypertension 01/28/2008  . HYPERCHOLESTEROLEMIA 01/07/2008  .  GLAUCOMA 01/07/2008  . 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 GERD 04/14/2007  . Osteoporosis 04/14/2007  . History of colonic polyps 04/14/2007    Blythe Stanford, SPT 01/23/2016, 3:28 PM  [place] [facility] [address], [place], [address] Phone: [phone]   Fax:  [phone]  Name: Samantha [name] MRN: [id number] Date of Birth: 1946-09-03

## 2016-01-25 ENCOUNTER — Encounter: Payer: Medicare Other | Admitting: Physical Therapy

## 2016-01-25 ENCOUNTER — Ambulatory Visit: Payer: Medicare Other | Admitting: Physical Therapy

## 2016-01-25 ENCOUNTER — Encounter: Payer: Self-pay | Admitting: Physical Therapy

## 2016-01-25 DIAGNOSIS — M546 Pain in thoracic spine: Secondary | ICD-10-CM

## 2016-01-25 DIAGNOSIS — M6281 Muscle weakness (generalized): Secondary | ICD-10-CM

## 2016-01-25 NOTE — Therapy (Signed)
[facility] [address] [place], [address] Phone: [phone]   Fax:  [phone]  Physical Therapy Treatment  Patient Details  Name: Samantha [name] MRN: [id number] Date of Birth: 27-Nov-1945 Referring Provider: Cy Blamer, MD  Encounter Date: 01/25/2016      PT End of Session - 01/25/16 1857    Visit Number 8   Number of Visits 12   Date for PT Re-Evaluation 02/13/16   Authorization Type 8   Authorization Time Period 10 (G code)   PT Start Time 1525   PT Stop Time 1615   PT Time Calculation (min) 50 min   Activity Tolerance Patient tolerated treatment well   Behavior During Therapy [facility] for tasks assessed/performed      Past Medical History  Diagnosis Date  . ASTHMA 04/14/2007  . HYPERCHOLESTEROLEMIA 01/07/2008  . HYPERTENSION 01/28/2008  . SINUSITIS 02/10/2009  . ALLERGIC RHINITIS 07/26/2008  . INSOMNIA 05/10/2008  . FIBROIDS, UTERUS 05/10/2008  . OSTEOPOROSIS 04/14/2007  . OSTEOARTHRITIS 11/28/2009  . GERD 04/14/2007  . Irritable bowel syndrome 04/06/2010  . CHEST PAIN 08/26/2008  . HYPERGLYCEMIA 11/28/2009  . 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 ASYMPTOMATIC POSTMENOPAUSAL STATUS 05/10/2008  . 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 Episcleritis   . Fibromyalgia     Past Surgical History  Procedure Laterality Date  . Nasal polyp surgery      Polypectomy and septoplasty  . Laparoscopy    . Neck injury  1983  . Stress cardiolite  02/13/2002  . Electrocardiogram  12/04/2006  . Colonoscopy w/ polypectomy      There were no vitals filed for this visit.      Subjective Assessment - 01/25/16 1527    Subjective Patient reports increased pain and aggravation after looking after grandchild yesterday. Reports no increased aggravation or soreness after the previous treatment session.    Patient Stated Goals To decrease the mid back pain.    Currently in Pain? Yes   Pain Score 4    Pain Location Back   Pain Orientation Mid;Upper   Pain Descriptors / Indicators Aching   Pain Type Chronic pain   Pain Onset More than a month ago      Objective: Observation: Sitting posture: slight forward head posture with forwarded rounded shoulders.  Palpation: Increased tenderness to palpation along thoracic extensors, upper traps, and lower traps bilaterally   Treatment:   Therapeutic Exercise: Patient performed exercises with guidance, verbal and tactile cues and demonstration of therapist:  Straight arm pull downs at [place] in standing -- 1 x 15 #10 Reverse pull up at [facility] -- x5 10#; x15 15# Scapular retraction at [place] -- x 15 #7 Scapular depression at [facility]-- x 15, #5; x 15 with 3# in hand Punches at [place] bilaterally #5 -- manual assist by therapist Tricep extension behind the head in sitting (bliateral forward flexion holding weight)-- #3 --x15 with patient seated on stability ball on platform with close supervision/guarding Thoracic Extension in sitting -- x 15 reps with towel behind back`` Multifidus Crunch in sitting -- x 15 Upper trap stretch -- 2 x 20 sec bilaterally Wall angels facing the wall -- x15   Manual Therapy:  STM to upper traps and thoracic extensors utilizing deep and superficial techniques to decrease muscle spasms and guarding with the patient positioned in massage chair.  Patient response to treatment: Decreased spasms by 50% after performing manual therapy to  thoracic extensors/ upper traps and patient's pain decreased after treatment. Good demonstration of punches at [facility] and patient required manual assist to perform with proper technique.         PT Education - 01/25/16 1856    Education provided Yes   Education Details HEP: Upper trap stretch in sitting   Person(s) Educated Patient   Methods Explanation;Demonstration   Comprehension Verbalized understanding;Returned demonstration             PT Long Term Goals -  01/02/16 1110    PT LONG TERM GOAL #1   Title Pt will score under 20% on the modified oswestry by 02/13/16 to demonstrate significant improvement in mid/low back function and improved ability to perform lifting activities.   Baseline Modified Oswestry: 42%   Status New   PT LONG TERM GOAL #2   Title Pt will be independent with HEP performance and progression aimed towards improving thoracic muscular endurance, coordination, and strength by 02/13/16 to continue improvements after discharge from therapy.   Baseline Dependent with exercises performance and progression   Status New   PT LONG TERM GOAL #3   Title Patient will be able to sit for over 1 hour without experiencing thoracic spine pain by 02/13/16 to better be able to write at home without onset of pain.    Baseline Pain is significantly increased after 1 hour.    Status New               Plan - 01/25/16 1857    Clinical Impression Statement Patient is progressing towards long term goals requiring less cueing to perform scapular stabilization exercises and demonstrates decreased fatigue after performing exercise. Patient continues to require minimal cueing on scapular position (decreasing upper trap activation) with scapular retraction and patient will benefit from further skilled therapy to address these limitations and return to prior level of function.    Rehab Potential Good   PT Frequency 2x / week   PT Duration 6 weeks   PT Treatment/Interventions Electrical Stimulation;Iontophoresis 4mg /ml Dexamethasone;Moist Heat;Therapeutic activities;Therapeutic exercise;Ultrasound;Neuromuscular re-education;Patient/family education;Manual techniques;Passive range of motion   PT Next Visit Plan manual therapy, progressive exercises for strenth and flexibility, stabilization   PT Home Exercise Plan continue as instructed, added wall push ups x 10 1x/day      Patient will benefit from skilled therapeutic intervention in order to improve the  following deficits and impairments:  Decreased strength, Postural dysfunction, Decreased mobility, Hypomobility, Pain, Decreased endurance, Increased muscle spasms, Decreased range of motion, Decreased coordination  Visit Diagnosis: Muscle weakness (generalized)  Pain in thoracic spine     Problem List Patient Active Problem List   Diagnosis Date Noted  . Rhinitis, allergic 01/18/2016  . Rash and nonspecific skin eruption 10/03/2015  . Acute sinusitis 09/07/2015  . Lumbar compression fracture ([place]) 06/20/2015  . Increased pressure in the eye 06/02/2014  . Encounter for Medicare annual wellness exam 05/13/2013  . Fibromyalgia 02/26/2013  . Allergic rhinitis due to pollen 11/20/2010  . 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 IRRITABLE BOWEL SYNDROME 04/06/2010  . THYROID NODULE, RIGHT 12/07/2009  . OSTEOARTHRITIS 11/28/2009  . FIBROIDS, UTERUS 05/10/2008  . Insomnia 05/10/2008  . ASYMPTOMATIC POSTMENOPAUSAL STATUS 05/10/2008  . Essential hypertension 01/28/2008  . HYPERCHOLESTEROLEMIA 01/07/2008  . GLAUCOMA 01/07/2008  . 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 GERD 04/14/2007  . Osteoporosis 04/14/2007  . History of colonic polyps 04/14/2007    Blythe Stanford, SPT 01/26/2016, 11:39 AM  [facility] [address], [place], [address] Phone: [phone]   Fax:  [phone]  Name: Samantha [name] MRN: [id number] Date of Birth: 1946-02-08

## 2016-01-26 ENCOUNTER — Encounter: Payer: Self-pay | Admitting: Internal Medicine

## 2016-01-30 ENCOUNTER — Ambulatory Visit (INDEPENDENT_AMBULATORY_CARE_PROVIDER_SITE_OTHER): Payer: Medicare Other | Admitting: *Deleted

## 2016-01-30 ENCOUNTER — Encounter: Payer: Medicare Other | Admitting: Physical Therapy

## 2016-01-30 DIAGNOSIS — M25562 Pain in left knee: Secondary | ICD-10-CM | POA: Diagnosis not present

## 2016-01-30 DIAGNOSIS — Z780 Asymptomatic menopausal state: Secondary | ICD-10-CM | POA: Diagnosis not present

## 2016-01-30 DIAGNOSIS — R1032 Left lower quadrant pain: Secondary | ICD-10-CM | POA: Diagnosis not present

## 2016-01-30 DIAGNOSIS — M25521 Pain in right elbow: Secondary | ICD-10-CM | POA: Diagnosis not present

## 2016-01-30 DIAGNOSIS — J309 Allergic rhinitis, unspecified: Secondary | ICD-10-CM

## 2016-01-30 DIAGNOSIS — M7062 Trochanteric bursitis, left hip: Secondary | ICD-10-CM | POA: Diagnosis not present

## 2016-02-01 ENCOUNTER — Encounter: Payer: Self-pay | Admitting: Physical Therapy

## 2016-02-01 ENCOUNTER — Ambulatory Visit: Payer: Medicare Other | Admitting: Physical Therapy

## 2016-02-01 DIAGNOSIS — M546 Pain in thoracic spine: Secondary | ICD-10-CM

## 2016-02-01 DIAGNOSIS — M6281 Muscle weakness (generalized): Secondary | ICD-10-CM | POA: Diagnosis not present

## 2016-02-01 NOTE — Therapy (Signed)
[facility] [address], [place], [address] Phone: [phone]   Fax:  [phone]  Physical Therapy Treatment  Patient Details  Name: Samantha [name] MRN: [id number] Date of Birth: [birth year] Referring Provider: Cy Blamer, MD  Encounter Date: 02/01/2016      PT End of Session - 02/01/16 1856    Visit Number 9   Number of Visits 12   Date for PT Re-Evaluation 02/13/16   Authorization Type 9   Authorization Time Period 10 (G code)   PT Start Time 1533   PT Stop Time 1615   PT Time Calculation (min) 42 min   Activity Tolerance Patient tolerated treatment well   Behavior During Therapy [facility] for tasks assessed/performed      Past Medical History  Diagnosis Date  . ASTHMA 04/14/2007  . HYPERCHOLESTEROLEMIA 01/07/2008  . HYPERTENSION 01/28/2008  . SINUSITIS 02/10/2009  . ALLERGIC RHINITIS 07/26/2008  . INSOMNIA 05/10/2008  . FIBROIDS, UTERUS 05/10/2008  . OSTEOPOROSIS 04/14/2007  . OSTEOARTHRITIS 11/28/2009  . GERD 04/14/2007  . Irritable bowel syndrome 04/06/2010  . CHEST PAIN 08/26/2008  . HYPERGLYCEMIA 11/28/2009  . 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 ASYMPTOMATIC POSTMENOPAUSAL STATUS 05/10/2008  . 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 Episcleritis   . Fibromyalgia     Past Surgical History  Procedure Laterality Date  . Nasal polyp surgery      Polypectomy and septoplasty  . Laparoscopy    . Neck injury  1983  . Stress cardiolite  02/13/2002  . Electrocardiogram  12/04/2006  . Colonoscopy w/ polypectomy      There were no vitals filed for this visit.      Subjective Assessment - 02/01/16 1537    Subjective Patient reports increased pain in thoracic area . Mentions she saw orthopedic doctor for knee and elbow pain. States she still has difficulty with carrying items.    Patient Stated Goals To decrease the mid back pain.    Currently in Pain? Yes   Pain Score 3    Pain  Location Back   Pain Orientation Mid   Pain Descriptors / Indicators Aching   Pain Type Chronic pain   Pain Onset More than a month ago      Objective: Observation: Sitting posture: slight forward head posture with forward rounded shoulders.  Palpation: Increased tenderness to palpation along thoracic extensors, upper traps, and lower traps bilaterally   Treatment:   Therapeutic Exercise: Patient performed exercises with guidance, verbal and tactile cues and demonstration of therapist:  UBE -- 90 rpm isokinetic machine -- 3 min (15sec forward/15sec backward intervals) Straight arm pull downs at [place] in standing -- 1 x 20 #7 Reverse pull up at [facility] --  x20 15# Wall angels facing the wall -- x20 Scapular retraction at [place] -- x 15 #10 Scapular depression at [facility]--  x 15 with 5# (with 2# in hand) Punches with yellow resistance band -- x15 bilaterally  Shoulder B ER against towel in standing -- yellow band x 20 Tricep extension in sitting with 3# weight -- x20  (moist heat applied to back and both shoulders with patient seated following treatment session: no adverse skin reaction noted)  Manual Therapy:  STM to upper traps and thoracic extensors utilizing deep and superficial techniques to decrease muscle spasms and guarding with the patient positioned in massage chair.  Patient response to treatment: Decreased spasms by 50% after performing manual therapy  to thoracic extensors/ upper traps and patient's pain decreased after treatment. Good demonstration of shoulder B ER with yellow band requiring minimal cueing to activate scapular retractors.         PT Education - 02/01/16 1855    Education provided Yes   Education Details HEP: UBE for 3 min; educated to continue performing HEP at home.    Person(s) Educated Patient   Methods Explanation;Demonstration   Comprehension Verbalized understanding;Returned demonstration             PT Long Term Goals - 01/02/16  1110    PT LONG TERM GOAL #1   Title Pt will score under 20% on the modified oswestry by 02/13/16 to demonstrate significant improvement in mid/low back function and improved ability to perform lifting activities.   Baseline Modified Oswestry: 42%   Status New   PT LONG TERM GOAL #2   Title Pt will be independent with HEP performance and progression aimed towards improving thoracic muscular endurance, coordination, and strength by 02/13/16 to continue improvements after discharge from therapy.   Baseline Dependent with exercises performance and progression   Status New   PT LONG TERM GOAL #3   Title Patient will be able to sit for over 1 hour without experiencing thoracic spine pain by 02/13/16 to better be able to write at home without onset of pain.    Baseline Pain is significantly increased after 1 hour.    Status New               Plan - 02/01/16 1859    Clinical Impression Statement Patient is progressing towards long term goals demonstrating decreased spasms along the thoracic spine and demonstrates decreased fatigue after performing exercise. Although patient is improving she continues to require increased cueing to perform exercises with proper form/technique and will benefit from further skilled therapy to return to prior level of function.       Rehab Potential Good   PT Frequency 2x / week   PT Duration 6 weeks   PT Treatment/Interventions Electrical Stimulation;Iontophoresis 4mg /ml Dexamethasone;Moist Heat;Therapeutic activities;Therapeutic exercise;Ultrasound;Neuromuscular re-education;Patient/family education;Manual techniques;Passive range of motion   PT Next Visit Plan manual therapy, progressive exercises for strenth and flexibility, scapular stabilization   PT Home Exercise Plan continue as instructed, added wall push ups x 10 1x/day      Patient will benefit from skilled therapeutic intervention in order to improve the following deficits and impairments:  Decreased  strength, Postural dysfunction, Decreased mobility, Hypomobility, Pain, Decreased endurance, Increased muscle spasms, Decreased range of motion, Decreased coordination  Visit Diagnosis: Muscle weakness (generalized)  Pain in thoracic spine     Problem List Patient Active Problem List   Diagnosis Date Noted  . Rhinitis, allergic 01/18/2016  . Rash and nonspecific skin eruption 10/03/2015  . Acute sinusitis 09/07/2015  . Lumbar compression fracture ([place]) 06/20/2015  . Increased pressure in the eye 06/02/2014  . Encounter for Medicare annual wellness exam 05/13/2013  . Fibromyalgia 02/26/2013  . Allergic rhinitis due to pollen 11/20/2010  . 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 IRRITABLE BOWEL SYNDROME 04/06/2010  . THYROID NODULE, RIGHT 12/07/2009  . OSTEOARTHRITIS 11/28/2009  . FIBROIDS, UTERUS 05/10/2008  . Insomnia 05/10/2008  . ASYMPTOMATIC POSTMENOPAUSAL STATUS 05/10/2008  . Essential hypertension 01/28/2008  . HYPERCHOLESTEROLEMIA 01/07/2008  . GLAUCOMA 01/07/2008  . 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 GERD 04/14/2007  . Osteoporosis 04/14/2007  . History of colonic polyps 04/14/2007    Blythe Stanford, SPT 02/02/2016, 12:10 PM  [place]  [facility] [address] [place], [address] Phone: [phone]   Fax:  [phone]  Name: KRITHIKA ROMANIELLO MRN: [id number] Date of Birth: 03-07-1946

## 2016-02-07 ENCOUNTER — Ambulatory Visit: Payer: Medicare Other | Admitting: Physical Therapy

## 2016-02-07 ENCOUNTER — Encounter: Payer: Self-pay | Admitting: Physical Therapy

## 2016-02-07 DIAGNOSIS — M546 Pain in thoracic spine: Secondary | ICD-10-CM | POA: Diagnosis not present

## 2016-02-07 DIAGNOSIS — M6281 Muscle weakness (generalized): Secondary | ICD-10-CM

## 2016-02-07 NOTE — Therapy (Signed)
[facility] [address], [place], [address] Phone: [phone]   Fax:  [phone]  Physical Therapy Treatment  Patient Details  Name: Samantha [name] MRN: [id number] Date of Birth: 14-Oct-1945 Referring Provider: Cy Blamer, MD  Encounter Date: 02/07/2016      PT End of Session - 02/07/16 1458    Visit Number 10   Number of Visits 12   Date for PT Re-Evaluation 02/13/16   Authorization Type 10   Authorization Time Period 10 (G code)   PT Start Time 0815   PT Stop Time 0915   PT Time Calculation (min) 60 min   Activity Tolerance Patient tolerated treatment well   Behavior During Therapy [facility] for tasks assessed/performed      Past Medical History  Diagnosis Date  . ASTHMA 04/14/2007  . HYPERCHOLESTEROLEMIA 01/07/2008  . HYPERTENSION 01/28/2008  . SINUSITIS 02/10/2009  . ALLERGIC RHINITIS 07/26/2008  . INSOMNIA 05/10/2008  . FIBROIDS, UTERUS 05/10/2008  . OSTEOPOROSIS 04/14/2007  . OSTEOARTHRITIS 11/28/2009  . GERD 04/14/2007  . Irritable bowel syndrome 04/06/2010  . CHEST PAIN 08/26/2008  . HYPERGLYCEMIA 11/28/2009  . 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 ASYMPTOMATIC POSTMENOPAUSAL STATUS 05/10/2008  . 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 Episcleritis   . Fibromyalgia     Past Surgical History  Procedure Laterality Date  . Nasal polyp surgery      Polypectomy and septoplasty  . Laparoscopy    . Neck injury  1983  . Stress cardiolite  02/13/2002  . Electrocardiogram  12/04/2006  . Colonoscopy w/ polypectomy      There were no vitals filed for this visit.      Subjective Assessment - 02/07/16 0818    Subjective Patient reports minimal pain in the thoracic region today. States she has some difficulty sleeping at night secondary to the increased thoracic pain. Mentions she's able to sit for 1-2 hours comfortably before onset of pain, intermittently    Patient Stated Goals To  decrease the mid back pain.    Currently in Pain? No/denies   Pain Onset More than a month ago        Objective: Observation: Sitting posture: slight forward head posture with forward rounded shoulders.  Palpation: Increased tenderness to palpation along thoracic extensors, upper traps, and lower traps bilaterally   Treatment:   Therapeutic Exercise: Patient performed exercises with guidance, verbal and tactile cues and demonstration of therapist:  UBE -- 90 rpm isokinetic machine -- 4 min (15sec forward/15sec backward intervals) Straight arm pull downs at [place] in standing -- x 20 #10 Reverse pull up at [facility] -- x20 15# Shoulder flexion with yellow physioball -- x15 Scapular retraction at [place] -- x 20 #10 Punches with yellow resistance band -- x15 bilaterally, x15 (green band) Body Blade (large/small) -- x 20 sec at hips, 90 deg of shoulder flexion, unilaterally at one side Chest Press at [facility] in sitting -- x15 #5 Tricep extension in sitting with 3# weight -- x20 on bosu ball    Manual Therapy:  STM to bilateral upper traps and thoracic extensors utilizing deep and superficial techniques to decrease muscle spasms and guarding with the patient positioned in massage chair.  Patient response to treatment: Decreased spasms by 33% after performing manual therapy to thoracic extensors/ upper traps and no aggravation of pain during or after treatment. Good demonstration of reverse pull up at [facility] requiring minimal cueing for proper shoulder  positioning.       PT Education - Feb 28, 2016 1457    Education provided Yes   Education Details HEP: body blade, serratus punches with green band   Person(s) Educated Patient   Methods Explanation;Demonstration   Comprehension Verbalized understanding;Returned demonstration             PT Long Term Goals - 02/28/2016 November 26, 2249    PT LONG TERM GOAL #1   Title Pt will score under 20% on the modified oswestry by 02/13/16 to demonstrate  significant improvement in mid/low back function and improved ability to perform lifting activities.   Baseline Modified Oswestry: 42%  (2016/02/28 31% partially met)   Status Partially Met   PT LONG TERM GOAL #2   Title Pt will be independent with HEP performance and progression aimed towards improving thoracic muscular endurance, coordination, and strength by 02/13/16 to continue improvements after discharge from therapy.   Baseline Dependent with exercises performance and progression (minimal cuing required for correct technique and appropriate intensity)   Status On-going   PT LONG TERM GOAL #3   Title Patient will be able to sit for over 1 hour without experiencing thoracic spine pain by 02/13/16 to better be able to write at home without onset of pain.    Baseline Pain is significantly increased after 1 hour. ( patient reports she is able ot sit 1-2 hours before onset of pain: intermittently)   Status Partially Met               Plan - 02-28-16 1508    Clinical Impression Statement Patients is progressing towards long term goals demonstrating significant improvements in thoracic/scapular muscular endurance/coordination and modified oswestry scoring (31% self perceived dysfunction). Although the patient is improving she continues to demonstrate significant limitations in thoracic/scapular function in terms of strength, endurance, and motor control and patient will benefit from further skilled therapy to return to prior level of function.    Rehab Potential Good   PT Frequency 2x / week   PT Duration 6 weeks   PT Treatment/Interventions Electrical Stimulation;Iontophoresis 54m/ml Dexamethasone;Moist Heat;Therapeutic activities;Therapeutic exercise;Ultrasound;Neuromuscular re-education;Patient/family education;Manual techniques;Passive range of motion   PT Next Visit Plan manual therapy, progressive exercises for strenth and flexibility, scapular stabilization   PT Home Exercise Plan continue  as instructed, added wall push ups x 10 1x/day      Patient will benefit from skilled therapeutic intervention in order to improve the following deficits and impairments:  Decreased strength, Postural dysfunction, Decreased mobility, Hypomobility, Pain, Decreased endurance, Increased muscle spasms, Decreased range of motion, Decreased coordination  Visit Diagnosis: Muscle weakness (generalized)  Pain in thoracic spine       G-Codes - 006/20/17203/19/2257   Functional Assessment Tool Used Modified Oswestry, pain scale, ROM, strength, clinical judgement   Functional Limitation Changing and maintaining body position   Changing and Maintaining Body Position Current Status ((B3419 At least 20 percent but less than 40 percent impaired, limited or restricted   Changing and Maintaining Body Position Goal Status ((F7902 At least 1 percent but less than 20 percent impaired, limited or restricted      Problem List Patient Active Problem List   Diagnosis Date Noted  . Rhinitis, allergic 01/18/2016  . Rash and nonspecific skin eruption 10/03/2015  . Acute sinusitis 09/07/2015  . Lumbar compression fracture ([place] 06/20/2015  . Increased pressure in the eye 06/02/2014  . Encounter for Medicare annual wellness exam 05/13/2013  . Fibromyalgia 02/26/2013  . Allergic rhinitis due to pollen  11/20/2010  . 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 IRRITABLE BOWEL SYNDROME 04/06/2010  . THYROID NODULE, RIGHT 12/07/2009  . OSTEOARTHRITIS 11/28/2009  . FIBROIDS, UTERUS 05/10/2008  . Insomnia 05/10/2008  . ASYMPTOMATIC POSTMENOPAUSAL STATUS 05/10/2008  . Essential hypertension 01/28/2008  . HYPERCHOLESTEROLEMIA 01/07/2008  . GLAUCOMA 01/07/2008  . 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 GERD 04/14/2007  . Osteoporosis 04/14/2007  . History of colonic polyps 04/14/2007    Blythe Stanford, SPT 02/07/2016, 10:59 PM  [facility] [address], [place], [address] Phone: [phone]   Fax:  [phone]  Name: HOLLYNN GARNO MRN: [id number] Date of Birth: July 11, 1946

## 2016-02-08 ENCOUNTER — Encounter: Payer: Medicare Other | Admitting: Physical Therapy

## 2016-02-13 ENCOUNTER — Encounter: Payer: Self-pay | Admitting: Physical Therapy

## 2016-02-13 ENCOUNTER — Ambulatory Visit: Payer: Medicare Other | Attending: Rheumatology | Admitting: Physical Therapy

## 2016-02-13 ENCOUNTER — Ambulatory Visit: Payer: Medicare Other

## 2016-02-13 DIAGNOSIS — M6281 Muscle weakness (generalized): Secondary | ICD-10-CM | POA: Diagnosis not present

## 2016-02-13 DIAGNOSIS — M546 Pain in thoracic spine: Secondary | ICD-10-CM | POA: Diagnosis not present

## 2016-02-13 NOTE — Therapy (Signed)
[place] [facility] [address] [place], [address] Phone: [phone]   Fax:  [phone]  Physical Therapy Treatment  Patient Details  Name: Samantha [name] MRN: [id number] Date of Birth: May 09, 1946 Referring Provider: Cy Blamer, MD  Encounter Date: 02/13/2016      PT End of Session - 02/13/16 1518    Visit Number 11   Number of Visits 24   Date for PT Re-Evaluation 03/26/16   Authorization Type 11   Authorization Time Period 20 (G code)   PT Start Time 1030   PT Stop Time 1115   PT Time Calculation (min) 45 min   Activity Tolerance Patient tolerated treatment well   Behavior During Therapy [facility] for tasks assessed/performed      Past Medical History  Diagnosis Date  . ASTHMA 04/14/2007  . HYPERCHOLESTEROLEMIA 01/07/2008  . HYPERTENSION 01/28/2008  . SINUSITIS 02/10/2009  . ALLERGIC RHINITIS 07/26/2008  . INSOMNIA 05/10/2008  . FIBROIDS, UTERUS 05/10/2008  . OSTEOPOROSIS 04/14/2007  . OSTEOARTHRITIS 11/28/2009  . GERD 04/14/2007  . Irritable bowel syndrome 04/06/2010  . CHEST PAIN 08/26/2008  . HYPERGLYCEMIA 11/28/2009  . 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 ASYMPTOMATIC POSTMENOPAUSAL STATUS 05/10/2008  . 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 Episcleritis   . Fibromyalgia     Past Surgical History  Procedure Laterality Date  . Nasal polyp surgery      Polypectomy and septoplasty  . Laparoscopy    . Neck injury  1983  . Stress cardiolite  02/13/2002  . Electrocardiogram  12/04/2006  . Colonoscopy w/ polypectomy      There were no vitals filed for this visit.      Subjective Assessment - 02/13/16 1032    Subjective Patient reports decreased thoracic back pain. Reports minor increase in pain after shopping and states shes been performing her home exercise program.   Patient Stated Goals To decrease the mid back pain.    Currently in Pain? No/denies   Pain Onset More than a month ago         Objective: Observation: Sitting posture: slight forward head posture with forward rounded shoulders.  Palpation: Increased tenderness to palpation along thoracic extensors, upper traps, and lower traps bilaterally   Treatment:   Therapeutic Exercise: Patient performed exercises with guidance, verbal and tactile cues and demonstration of therapist:  Multifdus Crunch in sitting -- x10 Resisted extension in sitting -- x 20 double grey resistance band UBE -- 90 rpm isokinetic machine -- 4 min (15sec forward/15sec backward intervals) Scapular retraction at [place] -- x 20 #15 Pulley rotational punches in staggered tandem -- x30 bilaterally Pulleys in sitting facing away from wall -- x20 bilaterally Punches with green resistance band -- x20 bilaterally Straight arm pull downs at [place] in standing -- x 20 #10 Reverse pull up in sitting at [employer] -- x20 15#  Manual Therapy:  STM to bilateral upper traps and thoracic extensors utilizing deep and superficial techniques to decrease muscle spasms and guarding with patient positioned in massage chair.  Patient response to treatment: Decreased spasms by 50% after performing manual therapy to thoracic extensors/ upper traps and no aggravation of pain during or after treatment. Good demonstration of pulley exercises requiring minimal cueing for proper shoulder positioning.        PT Education - 02/13/16 1517    Education provided Yes   Education Details HEP: Pulley shoulder AROM, punches with green band  Person(s) Educated Patient   Methods Handout;Explanation;Demonstration   Comprehension Verbalized understanding;Returned demonstration             PT Long Term Goals - 02/13/16 1520    PT LONG TERM GOAL #1   Title Pt will score under 20% on the modified oswestry by 03/26/16 to demonstrate significant improvement in mid/low back function and improved ability to perform lifting activities.   Baseline Modified Oswestry: 42%   (02/07/16 31% partially met)   Status Revised   PT LONG TERM GOAL #2   Title Pt will be independent with HEP performance and progression aimed towards improving thoracic muscular endurance, coordination, and strength by 03/26/16 to continue improvements after discharge from therapy.   Baseline Dependent with exercises performance and progression (minimal cuing required for correct technique and appropriate intensity)   Status On-going   PT LONG TERM GOAL #3   Title Patient will be able to sit for over 1 hour without experiencing thoracic spine pain by 03/26/16 to better be able to write at home without onset of pain.    Baseline Pain is significantly increased after 1 hour. ( patient reports she is able ot sit 1-2 hours before onset of pain: intermittently)   Status Revised               Plan - 02/13/16 1521    Clinical Impression Statement Patient is progressing well towards long term goals demonstrating improved muscular control and endurance with scapular and thoracic exercises. Patient requires less cueing to perform exercises with appropriate technique and form indicating functional carryover between vists. Although patient is improving, she demonstrates a decreased modified oswestry score (31% self perceived dysfunction) and continues to demonstrate decreased muscular coordination/endurance and patient will benefit from further skilled therapy to return to prior level of function.    Rehab Potential Good   PT Frequency 2x / week   PT Duration 6 weeks   PT Treatment/Interventions Electrical Stimulation;Iontophoresis 24m/ml Dexamethasone;Moist Heat;Therapeutic activities;Therapeutic exercise;Ultrasound;Neuromuscular re-education;Patient/family education;Manual techniques;Passive range of motion   PT Next Visit Plan manual therapy, progressive exercises for strenth and flexibility, scapular stabilization   PT Home Exercise Plan continue as instructed, added wall push ups x 10 1x/day       Patient will benefit from skilled therapeutic intervention in order to improve the following deficits and impairments:  Decreased strength, Postural dysfunction, Decreased mobility, Hypomobility, Pain, Decreased endurance, Increased muscle spasms, Decreased range of motion, Decreased coordination  Visit Diagnosis: Muscle weakness (generalized) - Plan: PT plan of care cert/re-cert  Pain in thoracic spine - Plan: PT plan of care cert/re-cert     Problem List Patient Active Problem List   Diagnosis Date Noted  . Rhinitis, allergic 01/18/2016  . Rash and nonspecific skin eruption 10/03/2015  . Acute sinusitis 09/07/2015  . [place]bar compression fracture ([place] 06/20/2015  . Increased pressure in the eye 06/02/2014  . Encounter for Medicare annual wellness exam 05/13/2013  . Fibromyalgia 02/26/2013  . Allergic rhinitis due to pollen 11/20/2010  . 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 IRRITABLE BOWEL SYNDROME 04/06/2010  . THYROID NODULE, RIGHT 12/07/2009  . OSTEOARTHRITIS 11/28/2009  . FIBROIDS, UTERUS 05/10/2008  . Insomnia 05/10/2008  . ASYMPTOMATIC POSTMENOPAUSAL STATUS 05/10/2008  . Essential hypertension 01/28/2008  . HYPERCHOLESTEROLEMIA 01/07/2008  . GLAUCOMA 01/07/2008  . 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 GERD 04/14/2007  . Osteoporosis 04/14/2007  . History of colonic polyps 04/14/2007    WBlythe Stanford SPT 02/13/2016, 5:55 PM  [facility] [address] [place], [address] Phone: [phone]   Fax:  [phone]  Name: EMMAGENE ORTNER MRN: [id number] Date of Birth: 1945-11-18

## 2016-02-15 ENCOUNTER — Encounter: Payer: Self-pay | Admitting: Physical Therapy

## 2016-02-15 ENCOUNTER — Ambulatory Visit: Payer: Medicare Other | Admitting: Physical Therapy

## 2016-02-15 DIAGNOSIS — M6281 Muscle weakness (generalized): Secondary | ICD-10-CM | POA: Diagnosis not present

## 2016-02-15 DIAGNOSIS — M546 Pain in thoracic spine: Secondary | ICD-10-CM

## 2016-02-15 NOTE — Therapy (Signed)
[place] [facility] [address] [place], [address] Phone: [phone]   Fax:  [phone]  Physical Therapy Treatment  Patient Details  Name: Samantha [name] MRN: [id number] Date of Birth: [birth year] Referring Provider: Cy Blamer, MD  Encounter Date: 02/15/2016      PT End of Session - 02/15/16 1557    Visit Number 12   Number of Visits 24   Date for PT Re-Evaluation 03/26/16   Authorization Type 12   Authorization Time Period 20 (G code)   PT Start Time 1530   PT Stop Time 1615   PT Time Calculation (min) 45 min   Activity Tolerance Patient tolerated treatment well   Behavior During Therapy [facility] for tasks assessed/performed      Past Medical History  Diagnosis Date  . ASTHMA 04/14/2007  . HYPERCHOLESTEROLEMIA 01/07/2008  . HYPERTENSION 01/28/2008  . SINUSITIS 02/10/2009  . ALLERGIC RHINITIS 07/26/2008  . INSOMNIA 05/10/2008  . FIBROIDS, UTERUS 05/10/2008  . OSTEOPOROSIS 04/14/2007  . OSTEOARTHRITIS 11/28/2009  . GERD 04/14/2007  . Irritable bowel syndrome 04/06/2010  . CHEST PAIN 08/26/2008  . HYPERGLYCEMIA 11/28/2009  . 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 ASYMPTOMATIC POSTMENOPAUSAL STATUS 05/10/2008  . 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 Episcleritis   . Fibromyalgia     Past Surgical History  Procedure Laterality Date  . Nasal polyp surgery      Polypectomy and septoplasty  . Laparoscopy    . Neck injury  1983  . Stress cardiolite  02/13/2002  . Electrocardiogram  12/04/2006  . Colonoscopy w/ polypectomy      There were no vitals filed for this visit.      Subjective Assessment - 02/15/16 1532    Subjective Patient reports her back is feeling stiff and is experiencing increased aggravation of symptoms when sitting for longer than an hour. Reports increased generalized fatigue from taking care of her grandson.    Patient Stated Goals To decrease the mid back pain.    Currently  in Pain? No/denies   Pain Onset More than a month ago      Objective: Observation: Sitting posture: slight forward head posture with forward rounded shoulders.  Palpation: Increased tenderness to palpation along thoracic extensors, upper traps, and lower traps bilaterally   Treatment:   Therapeutic Exercise: Patient performed exercises with guidance, verbal and tactile cues and demonstration of therapist:  Wall angels facing wall -- x15 Resisted extension in sitting on bosu-- x 20 double grey resistance band UBE -- 70 rpm isokinetic machine in standing -- 4 min (15sec forward/15sec backward intervals) Scapular retraction sitting at [place] -- x 20 #10 Punches with green resistance band in standing -- x20 unilaterally, bilaterally x20 Shoulder blade squeezes -in standing -- x 20 Straight arm pull downs at [place] in standing -- x 20 #10 Reverse pull up in sitting at [place] -- x20 15# AAROM: pulleys in sitting and standing in staggered stance -- x 30  Manual Therapy:  STM to bilateral upper traps and thoracic extensors utilizing deep and superficial techniques to decrease muscle spasms and guarding with patient positioned in massage chair.  Patient response to treatment: Decreased muscular spasms and pain by 50% after performing manual therapy indicating decreased muscular guarding. Aggravation of symptoms along the upper traps after performing straight arm pull downs at [facility] which is decreased after performing shoulder blade squeezes indicating improvement in shoulder joint positioning.  PT Education - 02/15/16 1558    Education provided Yes   Education Details HEP: Educated to maintain HEP at home   Person(s) Educated Patient   Methods Explanation;Demonstration   Comprehension Verbalized understanding;Returned demonstration             PT Long Term Goals - 02/13/16 1520    PT LONG TERM GOAL #1   Title Pt will score under 20% on the modified oswestry by  03/26/16 to demonstrate significant improvement in mid/low back function and improved ability to perform lifting activities.   Baseline Modified Oswestry: 42%  (02/07/16 31% partially met)   Status Revised   PT LONG TERM GOAL #2   Title Pt will be independent with HEP performance and progression aimed towards improving thoracic muscular endurance, coordination, and strength by 03/26/16 to continue improvements after discharge from therapy.   Baseline Dependent with exercises performance and progression (minimal cuing required for correct technique and appropriate intensity)   Status On-going   PT LONG TERM GOAL #3   Title Patient will be able to sit for over 1 hour without experiencing thoracic spine pain by 03/26/16 to better be able to write at home without onset of pain.    Baseline Pain is significantly increased after 1 hour. ( patient reports she is able ot sit 1-2 hours before onset of pain: intermittently)   Status Revised               Plan - 02/15/16 1928    Clinical Impression Statement Focused on performing thoracic/scapular muscular endurance and coordination in standing to improve thoracic and shoulder functional capacity. Patient demonstrated ealy onset of fatigue with exercise, indicating decreased muscular endurance and patient will benefit from further skilled therapy to return to prior level of function.    Rehab Potential Good   PT Frequency 2x / week   PT Duration 6 weeks   PT Treatment/Interventions Electrical Stimulation;Iontophoresis 36m/ml Dexamethasone;Moist Heat;Therapeutic activities;Therapeutic exercise;Ultrasound;Neuromuscular re-education;Patient/family education;Manual techniques;Passive range of motion   PT Next Visit Plan manual therapy, progressive exercises for strenth and flexibility, scapular stabilization   PT Home Exercise Plan continue as instructed, added wall push ups x 10 1x/day      Patient will benefit from skilled therapeutic intervention in  order to improve the following deficits and impairments:  Decreased strength, Postural dysfunction, Decreased mobility, Hypomobility, Pain, Decreased endurance, Increased muscle spasms, Decreased range of motion, Decreased coordination  Visit Diagnosis: Muscle weakness (generalized)  Pain in thoracic spine     Problem List Patient Active Problem List   Diagnosis Date Noted  . Rhinitis, allergic 01/18/2016  . Rash and nonspecific skin eruption 10/03/2015  . Acute sinusitis 09/07/2015  . Lumbar compression fracture ([place] 06/20/2015  . Increased pressure in the eye 06/02/2014  . Encounter for Medicare annual wellness exam 05/13/2013  . Fibromyalgia 02/26/2013  . Allergic rhinitis due to pollen 11/20/2010  . 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 IRRITABLE BOWEL SYNDROME 04/06/2010  . THYROID NODULE, RIGHT 12/07/2009  . OSTEOARTHRITIS 11/28/2009  . FIBROIDS, UTERUS 05/10/2008  . Insomnia 05/10/2008  . ASYMPTOMATIC POSTMENOPAUSAL STATUS 05/10/2008  . Essential hypertension 01/28/2008  . HYPERCHOLESTEROLEMIA 01/07/2008  . GLAUCOMA 01/07/2008  . 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 GERD 04/14/2007  . Osteoporosis 04/14/2007  . History of colonic polyps 04/14/2007    WBlythe Stanford SPT 02/15/2016, 8:03 PM  [facility] [address] [place] [address]Phone: [phone]  Fax:  [phone] Name:  Samantha [name] MRN: [id number] Date of Birth: 18-Nov-1945

## 2016-02-17 ENCOUNTER — Ambulatory Visit (INDEPENDENT_AMBULATORY_CARE_PROVIDER_SITE_OTHER): Payer: Medicare Other | Admitting: *Deleted

## 2016-02-17 DIAGNOSIS — J309 Allergic rhinitis, unspecified: Secondary | ICD-10-CM | POA: Diagnosis not present

## 2016-02-20 ENCOUNTER — Ambulatory Visit: Payer: Medicare Other | Admitting: Physical Therapy

## 2016-02-20 ENCOUNTER — Ambulatory Visit (INDEPENDENT_AMBULATORY_CARE_PROVIDER_SITE_OTHER): Payer: Medicare Other | Admitting: *Deleted

## 2016-02-20 DIAGNOSIS — N83201 Unspecified ovarian cyst, right side: Secondary | ICD-10-CM | POA: Diagnosis not present

## 2016-02-20 DIAGNOSIS — D3910 Neoplasm of uncertain behavior of unspecified ovary: Secondary | ICD-10-CM | POA: Diagnosis not present

## 2016-02-20 DIAGNOSIS — N83209 Unspecified ovarian cyst, unspecified side: Secondary | ICD-10-CM | POA: Diagnosis not present

## 2016-02-20 DIAGNOSIS — J309 Allergic rhinitis, unspecified: Secondary | ICD-10-CM | POA: Diagnosis not present

## 2016-02-20 DIAGNOSIS — M546 Pain in thoracic spine: Secondary | ICD-10-CM

## 2016-02-20 DIAGNOSIS — M6281 Muscle weakness (generalized): Secondary | ICD-10-CM

## 2016-02-20 DIAGNOSIS — R1032 Left lower quadrant pain: Secondary | ICD-10-CM | POA: Diagnosis not present

## 2016-02-20 NOTE — Patient Instructions (Addendum)
Seated thoracic rotations x 12 to the L only for 3 sets  Soft tissue mobilization and grade I-II mobilizations to L paraspinal region around bra-line in prone in areas where patient reports she has been having pain. She reports she feels relief after manual techniques performed and no longer has any pain afterwards.   Band pull aparts with yellow, progressed to red t-band total of 2 sets with red t-band x 12 (felt appropriately in peri-scapular area).   Single arm rows x12 with 7# x 3 sets bilaterally with cuing for proper retraction (had been doing humeral extension).   Low rows with green, blue thera-band x 12 for 2 sets with cuing for keeping her elbows straight (she reports she did not feel this in her scapular area) -- attempted with cable x5# , continued to have difficulty feeling in peri-scapular area x 12 for 1 set bilaterally.

## 2016-02-21 NOTE — Therapy (Signed)
[place] [facility] [address] [place], [address] Phone: [phone]   Fax:  [phone]  Physical Therapy Treatment  Patient Details  Name: Samantha [name] MRN: [id number] Date of Birth: 03-12-1946 Referring Provider: Cy Blamer, MD  Encounter Date: 02/20/2016      PT End of Session - 02/21/16 1303    Visit Number 13   Number of Visits 24   Date for PT Re-Evaluation 03/26/16   Authorization Type 13   Authorization Time Period 20 (G code)   PT Start Time 1315   PT Stop Time 1400   PT Time Calculation (min) 45 min   Activity Tolerance Patient tolerated treatment well   Behavior During Therapy [facility] for tasks assessed/performed      Past Medical History  Diagnosis Date  . ASTHMA 04/14/2007  . HYPERCHOLESTEROLEMIA 01/07/2008  . HYPERTENSION 01/28/2008  . SINUSITIS 02/10/2009  . ALLERGIC RHINITIS 07/26/2008  . INSOMNIA 05/10/2008  . FIBROIDS, UTERUS 05/10/2008  . OSTEOPOROSIS 04/14/2007  . OSTEOARTHRITIS 11/28/2009  . GERD 04/14/2007  . Irritable bowel syndrome 04/06/2010  . CHEST PAIN 08/26/2008  . HYPERGLYCEMIA 11/28/2009  . 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 ASYMPTOMATIC POSTMENOPAUSAL STATUS 05/10/2008  . 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 Episcleritis   . Fibromyalgia     Past Surgical History  Procedure Laterality Date  . Nasal polyp surgery      Polypectomy and septoplasty  . Laparoscopy    . Neck injury  1983  . Stress cardiolite  02/13/2002  . Electrocardiogram  12/04/2006  . Colonoscopy w/ polypectomy      There were no vitals filed for this visit.      Subjective Assessment - 02/20/16 1323    Subjective Patient reports she almost cancelled her appointment today as she has had several other medical appointments today and is having some soreness in her neck and L shoulder. She reports some pain/stiffness in her L side of her mid-thoracic spine.    Pertinent History History  of Fibromyalgia; Pain stated "over 10 years ago" and had a recent aggravation of symptoms "sometime last year".   Limitations Sitting   How long can you sit comfortably? 1 hour   Diagnostic tests X-Ray: Positive for previous compression fracture (per pt report)   Patient Stated Goals To decrease the mid back pain.    Currently in Pain? Yes   Pain Score --  Reports stiffness/pain in her mid-back but right at this moment it is not bothering her. She reports a pain in her side which is being ivestigated by her Md.    Pain Onset More than a month ago   Aggravating Factors  Lifting, carrying groceries, lying on her back   Pain Relieving Factors Medication, therapy exercises, lying on her stomach.       Seated thoracic rotations x 12 to the L only for 3 sets  Soft tissue mobilization and grade I-II mobilizations to L paraspinal region around bra-line in prone in areas where patient reports she has been having pain. She reports she feels relief after manual techniques performed and no longer has any pain afterwards.   Band pull aparts with yellow, progressed to red t-band total of 2 sets with red t-band x 12 (felt appropriately in peri-scapular area).   Single arm rows x12 with 7# x 3 sets bilaterally with cuing for proper retraction (had been doing humeral extension).   Low rows with green,  blue thera-band x 12 for 2 sets with cuing for keeping her elbows straight (she reports she did not feel this in her scapular area) -- attempted with cable x5# , continued to have difficulty feeling in peri-scapular area x 12 for 1 set bilaterally.                            PT Education - 02/21/16 1303    Education provided Yes   Education Details Maintain HEP    Person(s) Educated Patient   Methods Explanation   Comprehension Verbalized understanding             PT Long Term Goals - 02/13/16 1520    PT LONG TERM GOAL #1   Title Pt will score under 20% on the modified  oswestry by 03/26/16 to demonstrate significant improvement in mid/low back function and improved ability to perform lifting activities.   Baseline Modified Oswestry: 42%  (02/07/16 31% partially met)   Status Revised   PT LONG TERM GOAL #2   Title Pt will be independent with HEP performance and progression aimed towards improving thoracic muscular endurance, coordination, and strength by 03/26/16 to continue improvements after discharge from therapy.   Baseline Dependent with exercises performance and progression (minimal cuing required for correct technique and appropriate intensity)   Status On-going   PT LONG TERM GOAL #3   Title Patient will be able to sit for over 1 hour without experiencing thoracic spine pain by 03/26/16 to better be able to write at home without onset of pain.    Baseline Pain is significantly increased after 1 hour. ( patient reports she is able ot sit 1-2 hours before onset of pain: intermittently)   Status Revised               Plan - 02/21/16 1304    Clinical Impression Statement Patient appears to have a combination of joint mobility restrictions and soft tissue restrictions in thoracic spine. She reports relief of symptoms with directed manual therapy, however poor motor firing patterns noted, specifically in her LT, which may be contributing to her symptoms. Findings of this session relayed to primary therapist.    Rehab Potential Good   PT Frequency 2x / week   PT Duration 6 weeks   PT Treatment/Interventions Electrical Stimulation;Iontophoresis 34m/ml Dexamethasone;Moist Heat;Therapeutic activities;Therapeutic exercise;Ultrasound;Neuromuscular re-education;Patient/family education;Manual techniques;Passive range of motion   PT Next Visit Plan manual therapy, progressive exercises for strenth and flexibility, scapular stabilization   PT Home Exercise Plan continue as instructed, added wall push ups x 10 1x/day      Patient will benefit from skilled  therapeutic intervention in order to improve the following deficits and impairments:  Decreased strength, Postural dysfunction, Decreased mobility, Hypomobility, Pain, Decreased endurance, Increased muscle spasms, Decreased range of motion, Decreased coordination  Visit Diagnosis: Muscle weakness (generalized)  Pain in thoracic spine     Problem List Patient Active Problem List   Diagnosis Date Noted  . Rhinitis, allergic 01/18/2016  . Rash and nonspecific skin eruption 10/03/2015  . Acute sinusitis 09/07/2015  . Lumbar compression fracture ([place] 06/20/2015  . Increased pressure in the eye 06/02/2014  . Encounter for Medicare annual wellness exam 05/13/2013  . Fibromyalgia 02/26/2013  . Allergic rhinitis due to pollen 11/20/2010  . 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 IRRITABLE BOWEL SYNDROME 04/06/2010  . THYROID NODULE, RIGHT 12/07/2009  . OSTEOARTHRITIS 11/28/2009  . FIBROIDS, UTERUS 05/10/2008  . Insomnia 05/10/2008  .  ASYMPTOMATIC POSTMENOPAUSAL STATUS 05/10/2008  . Essential hypertension 01/28/2008  . HYPERCHOLESTEROLEMIA 01/07/2008  . GLAUCOMA 01/07/2008  . 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 GERD 04/14/2007  . Osteoporosis 04/14/2007  . History of colonic polyps 04/14/2007   Kerman Passey, PT, DPT    02/21/2016, 1:10 PM  [place] [facility] [address] [place], [address] Phone: [phone]   Fax:  [phone]  Name: Samantha [name] MRN: [id number] Date of Birth: 1946-07-05

## 2016-02-22 ENCOUNTER — Encounter: Payer: Self-pay | Admitting: Physical Therapy

## 2016-02-22 ENCOUNTER — Ambulatory Visit: Payer: Medicare Other | Admitting: Physical Therapy

## 2016-02-22 DIAGNOSIS — M546 Pain in thoracic spine: Secondary | ICD-10-CM | POA: Diagnosis not present

## 2016-02-22 DIAGNOSIS — M6281 Muscle weakness (generalized): Secondary | ICD-10-CM | POA: Diagnosis not present

## 2016-02-22 NOTE — Therapy (Signed)
[place] [facility] [address] [place], [address] Phone: [phone]   Fax:  [phone]  Physical Therapy Treatment  Patient Details  Name: Samantha [name] MRN: [id number] Date of Birth: [birth year] Referring Provider: Cy Blamer, MD  Encounter Date: 02/22/2016      PT End of Session - 02/22/16 1550    Visit Number 14   Number of Visits 24   Date for PT Re-Evaluation 03/26/16   Authorization Type 14   Authorization Time Period 20 (G code)   PT Start Time 1534   PT Stop Time 1615   PT Time Calculation (min) 41 min   Activity Tolerance Patient tolerated treatment well   Behavior During Therapy [facility] for tasks assessed/performed      Past Medical History  Diagnosis Date  . ASTHMA 04/14/2007  . HYPERCHOLESTEROLEMIA 01/07/2008  . HYPERTENSION 01/28/2008  . SINUSITIS 02/10/2009  . ALLERGIC RHINITIS 07/26/2008  . INSOMNIA 05/10/2008  . FIBROIDS, UTERUS 05/10/2008  . OSTEOPOROSIS 04/14/2007  . OSTEOARTHRITIS 11/28/2009  . GERD 04/14/2007  . Irritable bowel syndrome 04/06/2010  . CHEST PAIN 08/26/2008  . HYPERGLYCEMIA 11/28/2009  . 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 ASYMPTOMATIC POSTMENOPAUSAL STATUS 05/10/2008  . 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 Episcleritis   . Fibromyalgia     Past Surgical History  Procedure Laterality Date  . Nasal polyp surgery      Polypectomy and septoplasty  . Laparoscopy    . Neck injury  1983  . Stress cardiolite  02/13/2002  . Electrocardiogram  12/04/2006  . Colonoscopy w/ polypectomy      There were no vitals filed for this visit.      Subjective Assessment - 02/22/16 1954    Subjective Patient reports no pain in the thoracic spine today but mentions she generally feels very tired.    Pertinent History History of Fibromyalgia; Pain stated "over 10 years ago" and had a recent aggravation of symptoms "sometime last year".   Limitations Sitting   How long  can you sit comfortably? 1 hour   Diagnostic tests X-Ray: Positive for previous compression fracture (per pt report)   Patient Stated Goals To decrease the mid back pain.    Currently in Pain? No/denies   Pain Onset More than a month ago       Objective: Observation: Sitting posture: slight forward head posture with forward rounded shoulders.  Palpation: Increased tenderness to palpation along thoracic extensors, upper traps, and lower traps bilaterally   Treatment:   Therapeutic Exercise: Patient performed exercises with guidance, verbal and tactile cues and demonstration of therapist:  Wall angels facing wall -- x15 Straight arm pull downs at [place] in standing -- x 20 #10 Seated thoracic ball roll outs -- x20 (with red physioball) Standing scapular retraction at [place] -- x10  UBE -- 70 rpm isokinetic machine in standing -- 4 min (15sec forward/15sec backward intervals) Punches with blue resistance band in standing -- x20 unilaterally, bilaterally x20 Reverse pull up in sitting at [place] -- x25 20# AAROM: pulleys in sitting and standing in staggered stance -- x 30 bilaterally Chest press at [facility]  with assistance from therapist -- x15 #5  Manual Therapy:  STM to bilateral upper traps utilizing deep and superficial techniques to decrease muscle spasms and guarding with patient positioned in massage chair.  Patient response to treatment: Decreased muscular spasms and pain by 50% after performing manual therapy indicating decreased muscular  guarding. Good demonstration of UBE exercises requiring minimal cueing on decreasing shoulder elevation/upper trap activation to allow for proper muscular activation.         PT Education - 02/22/16 1955    Education provided Yes   Education Details HEP: forward roll outs on physioball    Person(s) Educated Patient   Methods Explanation;Demonstration   Comprehension Verbalized understanding;Returned demonstration             PT  Long Term Goals - 02/13/16 1520    PT LONG TERM GOAL #1   Title Pt will score under 20% on the modified oswestry by 03/26/16 to demonstrate significant improvement in mid/low back function and improved ability to perform lifting activities.   Baseline Modified Oswestry: 42%  (02/07/16 31% partially met)   Status Revised   PT LONG TERM GOAL #2   Title Pt will be independent with HEP performance and progression aimed towards improving thoracic muscular endurance, coordination, and strength by 03/26/16 to continue improvements after discharge from therapy.   Baseline Dependent with exercises performance and progression (minimal cuing required for correct technique and appropriate intensity)   Status On-going   PT LONG TERM GOAL #3   Title Patient will be able to sit for over 1 hour without experiencing thoracic spine pain by 03/26/16 to better be able to write at home without onset of pain.    Baseline Pain is significantly increased after 1 hour. ( patient reports she is able ot sit 1-2 hours before onset of pain: intermittently)   Status Revised               Plan - 02/22/16 1956    Clinical Impression Statement Patient demonstrates increased fatigue at end of therapy session indicating decreased muscular endurance along the scapular retractors. Patient requires minimal therapist assistance to perform scapular exercises throughout full AROM indicating decreased muscular strength/coordination and will benefit from further skilled therapy to return to prior level of function.    Rehab Potential Good   PT Frequency 2x / week   PT Duration 6 weeks   PT Treatment/Interventions Electrical Stimulation;Iontophoresis 29m/ml Dexamethasone;Moist Heat;Therapeutic activities;Therapeutic exercise;Ultrasound;Neuromuscular re-education;Patient/family education;Manual techniques;Passive range of motion   PT Next Visit Plan manual therapy, progressive exercises for strenth and flexibility, scapular  stabilization   PT Home Exercise Plan continue as instructed, added wall push ups x 10 1x/day      Patient will benefit from skilled therapeutic intervention in order to improve the following deficits and impairments:  Decreased strength, Postural dysfunction, Decreased mobility, Hypomobility, Pain, Decreased endurance, Increased muscle spasms, Decreased range of motion, Decreased coordination  Visit Diagnosis: Muscle weakness (generalized)  Pain in thoracic spine     Problem List Patient Active Problem List   Diagnosis Date Noted  . Rhinitis, allergic 01/18/2016  . Rash and nonspecific skin eruption 10/03/2015  . Acute sinusitis 09/07/2015  . [place]bar compression [place] ([place] 06/20/[place]  . Increased pressure in the eye 06/02/2014  . Encounter for Medicare annual wellness exam 05/13/2013  . Fibromyalgia 02/26/2013  . Allergic rhinitis due to pollen 11/20/2010  . 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 IRRITABLE BOWEL SYNDROME 04/06/2010  . THYROID NODULE, RIGHT 12/07/2009  . OSTEOARTHRITIS 11/28/2009  . FIBROIDS, UTERUS 05/10/2008  . Insomnia 05/10/2008  . ASYMPTOMATIC POSTMENOPAUSAL STATUS 05/10/2008  . Essential hypertension 01/28/2008  . HYPERCHOLESTEROLEMIA 01/07/2008  . GLAUCOMA 01/07/2008  . 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 GERD 04/14/2007  . Osteoporosis 04/14/2007  . History of colonic polyps 04/14/2007    WLake Bells  Marsella Suman, SPT 02/23/2016, 9:29 AM  [place] [facility] [address], [place], [address] Phone: [phone]   Fax:  [phone]  Name: Samantha [name] MRN: [id number] Date of Birth: [birth year]

## 2016-02-27 ENCOUNTER — Encounter: Payer: Self-pay | Admitting: Physical Therapy

## 2016-02-27 ENCOUNTER — Ambulatory Visit: Payer: Medicare Other | Admitting: Physical Therapy

## 2016-02-27 DIAGNOSIS — M6281 Muscle weakness (generalized): Secondary | ICD-10-CM | POA: Diagnosis not present

## 2016-02-27 DIAGNOSIS — M546 Pain in thoracic spine: Secondary | ICD-10-CM

## 2016-02-27 NOTE — Therapy (Signed)
[place] [facility] [address] [place], [address] Phone: [phone]   Fax:  [phone]  Physical Therapy Treatment  Patient Details  Name: Samantha [name] MRN: [id number] Date of Birth: 03-18-1946 Referring Provider: Cy Blamer, MD  Encounter Date: 02/27/2016      PT End of Session - 02/27/16 1919    Visit Number 15   Number of Visits 24   Date for PT Re-Evaluation 03/26/16   Authorization Type 15   Authorization Time Period 20 (G code)   PT Start Time 1033   PT Stop Time 1120   PT Time Calculation (min) 47 min   Activity Tolerance Patient tolerated treatment well   Behavior During Therapy [facility] for tasks assessed/performed      Past Medical History  Diagnosis Date  . ASTHMA 04/14/2007  . HYPERCHOLESTEROLEMIA 01/07/2008  . HYPERTENSION 01/28/2008  . SINUSITIS 02/10/2009  . ALLERGIC RHINITIS 07/26/2008  . INSOMNIA 05/10/2008  . FIBROIDS, UTERUS 05/10/2008  . OSTEOPOROSIS 04/14/2007  . OSTEOARTHRITIS 11/28/2009  . GERD 04/14/2007  . Irritable bowel syndrome 04/06/2010  . CHEST PAIN 08/26/2008  . HYPERGLYCEMIA 11/28/2009  . 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 ASYMPTOMATIC POSTMENOPAUSAL STATUS 05/10/2008  . 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 Episcleritis   . Fibromyalgia     Past Surgical History  Procedure Laterality Date  . Nasal polyp surgery      Polypectomy and septoplasty  . Laparoscopy    . Neck injury  1983  . Stress cardiolite  02/13/2002  . Electrocardiogram  12/04/2006  . Colonoscopy w/ polypectomy      There were no vitals filed for this visit.      Subjective Assessment - 02/27/16 1108    Subjective Patient reports no current pain in the shoulder or back. States she did not have any aggrvation of symptoms over the weekend, but states minor soreness when performing increased UE activity throughout the day.   Pertinent History History of Fibromyalgia; Pain stated "over  10 years ago" and had a recent aggravation of symptoms "sometime last year".   Limitations Sitting   How long can you sit comfortably? 1 hour   Diagnostic tests X-Ray: Positive for previous compression fracture (per pt report)   Patient Stated Goals To decrease the mid back pain.    Currently in Pain? No/denies   Pain Onset More than a month ago      Objective: Observation: Sitting posture: slight forward head posture with forward rounded shoulders.  Palpation: Increased tenderness to palpation along thoracic extensors and upper traps.  Treatment:   Therapeutic Exercise: Patient performed exercises with guidance, verbal and tactile cues and demonstration of therapist:  Wall angels facing wall -- x15 Straight arm pull downs at [place] in standing -- x 15 #12 Seated thoracic ball roll outs -- x30 (with red physioball) Standing scapular retraction at [place] -- bilaterally x15-- 12#, unilaterally 7# x15 Punches with blue resistance band in standing -- x30 unilaterally, bilaterally x20 Reverse pull up in sitting at [facility] --bilaterally x15 20#; unilaterally x15 10#  Manual Therapy:  STM to bilateral upper traps and thoracic extensors utilizing deep and superficial techniques to decrease muscle spasms and guarding with patient positioned in massage chair.  Modalities: One large ice pack placed over right shoulder to decrease pain and spasms positioned in sitting to decrease pain and spasms for 5 min.   Patient response to treatment: Decreased spasms and pain by  33% after performing manual therapy and mobility exercises indicating decreased muscular guarding. Good demonstration of unilateral reverse pull up at [facility] requiring minimal cueing on proper shoulder/scapular positioning to allow for appropriate muscular activation.           PT Education - 02/27/16 1919    Education provided Yes   Education Details HEP: unilateral reverse pull up, scapular retraction   Person(s) Educated  Patient   Methods Explanation;Demonstration   Comprehension Returned demonstration;Verbalized understanding             PT Long Term Goals - 02/13/16 1520    PT LONG TERM GOAL #1   Title Pt will score under 20% on the modified oswestry by 03/26/16 to demonstrate significant improvement in mid/low back function and improved ability to perform lifting activities.   Baseline Modified Oswestry: 42%  (02/07/16 31% partially met)   Status Revised   PT LONG TERM GOAL #2   Title Pt will be independent with HEP performance and progression aimed towards improving thoracic muscular endurance, coordination, and strength by 03/26/16 to continue improvements after discharge from therapy.   Baseline Dependent with exercises performance and progression (minimal cuing required for correct technique and appropriate intensity)   Status On-going   PT LONG TERM GOAL #3   Title Patient will be able to sit for over 1 hour without experiencing thoracic spine pain by 03/26/16 to better be able to write at home without onset of pain.    Baseline Pain is significantly increased after 1 hour. ( patient reports she is able ot sit 1-2 hours before onset of pain: intermittently)   Status Revised               Plan - 02/27/16 1925    Clinical Impression Statement Focused on performing muscular endurance based exercises to improve patient's increased pain after increased activtiy. Patient demonstrates early onset of fatigue when performing scapular endurance exercises indicating decreased musuclar endurance and patient will benefit from further skilled therapy aimed at improving current limitations to return to prior level of funciton.    Rehab Potential Good   PT Frequency 2x / week   PT Duration 6 weeks   PT Treatment/Interventions Electrical Stimulation;Iontophoresis 66m/ml Dexamethasone;Moist Heat;Therapeutic activities;Therapeutic exercise;Ultrasound;Neuromuscular re-education;Patient/family education;Manual  techniques;Passive range of motion   PT Next Visit Plan manual therapy, progressive exercises for strenth and flexibility, scapular stabilization   PT Home Exercise Plan continue as instructed, added wall push ups x 10 1x/day      Patient will benefit from skilled therapeutic intervention in order to improve the following deficits and impairments:  Decreased strength, Postural dysfunction, Decreased mobility, Hypomobility, Pain, Decreased endurance, Increased muscle spasms, Decreased range of motion, Decreased coordination  Visit Diagnosis: Muscle weakness (generalized)  Pain in thoracic spine     Problem List Patient Active Problem List   Diagnosis Date Noted  . Rhinitis, allergic 01/18/2016  . Rash and nonspecific skin eruption 10/03/2015  . Acute sinusitis 09/07/2015  . [place] compression [place] ([place] 06/20/2015  . Increased pressure in the eye 06/02/2014  . Encounter for Medicare annual wellness exam 05/13/2013  . Fibromyalgia 02/26/2013  . Allergic rhinitis due to pollen 11/20/2010  . 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 IRRITABLE BOWEL SYNDROME 04/06/2010  . THYROID NODULE, RIGHT 12/07/2009  . OSTEOARTHRITIS 11/28/2009  . FIBROIDS, UTERUS 05/10/2008  . Insomnia 05/10/2008  . ASYMPTOMATIC POSTMENOPAUSAL STATUS 05/10/2008  . Essential hypertension 01/28/2008  . HYPERCHOLESTEROLEMIA 01/07/2008  . GLAUCOMA 01/07/2008  . 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  GERD 04/14/2007  . Osteoporosis 04/14/2007  . History of colonic polyps 04/14/2007    Blythe Stanford, SPT 02/27/2016, 7:28 PM  [place] [facility] [address] [place], [address] Phone: [phone]   Fax:  [phone]  Name: Samantha [name] MRN: [id number] Date of Birth: 06-Oct-1945

## 2016-02-29 ENCOUNTER — Ambulatory Visit (INDEPENDENT_AMBULATORY_CARE_PROVIDER_SITE_OTHER): Payer: Medicare Other | Admitting: *Deleted

## 2016-02-29 ENCOUNTER — Ambulatory Visit: Payer: Medicare Other | Admitting: Physical Therapy

## 2016-02-29 ENCOUNTER — Encounter: Payer: Self-pay | Admitting: Physical Therapy

## 2016-02-29 DIAGNOSIS — M6281 Muscle weakness (generalized): Secondary | ICD-10-CM

## 2016-02-29 DIAGNOSIS — M546 Pain in thoracic spine: Secondary | ICD-10-CM

## 2016-02-29 DIAGNOSIS — J309 Allergic rhinitis, unspecified: Secondary | ICD-10-CM | POA: Diagnosis not present

## 2016-02-29 NOTE — Therapy (Signed)
[place] [facility] [address], [place], [address] Phone: [phone]   Fax:  [phone]  Physical Therapy Treatment  Patient Details  Name: Samantha [name] MRN: [id number] Date of Birth: 09-Aug-1946 Referring Provider: Cy Blamer, MD  Encounter Date: 02/29/2016      PT End of Session - 02/29/16 1545    Visit Number 16   Number of Visits 24   Date for PT Re-Evaluation 03/26/16   Authorization Type 16   Authorization Time Period 20 (G code)   PT Start Time 1531   PT Stop Time 1615   PT Time Calculation (min) 44 min   Activity Tolerance Patient tolerated treatment well   Behavior During Therapy [facility] for tasks assessed/performed      Past Medical History  Diagnosis Date  . ASTHMA 04/14/2007  . HYPERCHOLESTEROLEMIA 01/07/2008  . HYPERTENSION 01/28/2008  . SINUSITIS 02/10/2009  . ALLERGIC RHINITIS 07/26/2008  . INSOMNIA 05/10/2008  . FIBROIDS, UTERUS 05/10/2008  . OSTEOPOROSIS 04/14/2007  . OSTEOARTHRITIS 11/28/2009  . GERD 04/14/2007  . Irritable bowel syndrome 04/06/2010  . CHEST PAIN 08/26/2008  . HYPERGLYCEMIA 11/28/2009  . 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 ASYMPTOMATIC POSTMENOPAUSAL STATUS 05/10/2008  . 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 Episcleritis   . Fibromyalgia     Past Surgical History  Procedure Laterality Date  . Nasal polyp surgery      Polypectomy and septoplasty  . Laparoscopy    . Neck injury  1983  . Stress cardiolite  02/13/2002  . Electrocardiogram  12/04/2006  . Colonoscopy w/ polypectomy      There were no vitals filed for this visit.      Subjective Assessment - 02/29/16 1534    Subjective Patient reports increased mid back pain when at bible study and attributes it to the uncomfortable chair.    Pertinent History History of Fibromyalgia; Pain stated "over 10 years ago" and had a recent aggravation of symptoms "sometime last year".   Limitations Sitting   How long can you sit comfortably? 1 hour   Diagnostic tests X-Ray: Positive for previous compression fracture (per pt report)   Patient Stated Goals To decrease the mid back pain.    Currently in Pain? No/denies   Pain Onset More than a month ago      Objective: Observation: Sitting posture: slight forward head posture with forward rounded shoulders.  Palpation: Increased tenderness to palpation along thoracic extensors and upper traps.  Treatment:   Therapeutic Exercise: Patient performed exercises with guidance, verbal and tactile cues and demonstration of therapist:  Wall angels facing wall -- x15 Wall push up with PLUS -- x10 Straight arm pull downs at [place] in standing -- x 20 #10 Seated thoracic ball roll outs -- x25 (with red physioball) Low Row at [facility] -- x15 #10 Standing scapular retraction at [facility] -- bilaterally x20-- 10# Chest press at [facility] -- x15 #5 Reverse pull up in sitting at [facility] --bilaterally x20 20#;  UBE in standing reversing direction every x15sec -- 5 min Shoulder extension behind the back with red band -- x20 Standing rotations with shoulder pulley -- 3 min   Manual Therapy:  STM to bilateral upper traps and thoracic extensors utilizing deep and superficial techniques to decrease muscle spasms and guarding with patient positioned in massage chair.   Patient response to treatment: Decreased pain and spasms by 50% after performing STM over the upper traps and thoracic  extensors. Good demonstration of shoulder extension with red band requiring minimal cueing of shoulder position to perform with proper technique.             PT Education - 02/29/16 1546    Education provided Yes   Education Details Educated to maintain home program and possible discharge over the next week   Person(s) Educated Patient   Methods Explanation;Demonstration   Comprehension Verbalized understanding;Returned demonstration             PT Long Term Goals -  02/13/16 1520    PT LONG TERM GOAL #1   Title Pt will score under 20% on the modified oswestry by 03/26/16 to demonstrate significant improvement in mid/low back function and improved ability to perform lifting activities.   Baseline Modified Oswestry: 42%  (02/07/16 31% partially met)   Status Revised   PT LONG TERM GOAL #2   Title Pt will be independent with HEP performance and progression aimed towards improving thoracic muscular endurance, coordination, and strength by 03/26/16 to continue improvements after discharge from therapy.   Baseline Dependent with exercises performance and progression (minimal cuing required for correct technique and appropriate intensity)   Status On-going   PT LONG TERM GOAL #3   Title Patient will be able to sit for over 1 hour without experiencing thoracic spine pain by 03/26/16 to better be able to write at home without onset of pain.    Baseline Pain is significantly increased after 1 hour. ( patient reports she is able ot sit 1-2 hours before onset of pain: intermittently)   Status Revised               Plan - 02/29/16 1815    Clinical Impression Statement Continued focussing on improving muscular endurance and postural exercises secondary to patient's increased pain after increased activity and prolonged sitting. Patient requires minimal cueing on scapular positioning and demonstrates early onset of fatigue during exercise performance  indicating decreased muscular coordination and endurance. Patient will benefit from further skilled therapy aimed at improving limitations to return to prior level of function.    Rehab Potential Good   PT Frequency 2x / week   PT Duration 6 weeks   PT Treatment/Interventions Electrical Stimulation;Iontophoresis 4m/ml Dexamethasone;Moist Heat;Therapeutic activities;Therapeutic exercise;Ultrasound;Neuromuscular re-education;Patient/family education;Manual techniques;Passive range of motion   PT Next Visit Plan manual  therapy, progressive exercises for strenth and flexibility, scapular stabilization   PT Home Exercise Plan continue as instructed, added wall push ups x 10 1x/day      Patient will benefit from skilled therapeutic intervention in order to improve the following deficits and impairments:  Decreased strength, Postural dysfunction, Decreased mobility, Hypomobility, Pain, Decreased endurance, Increased muscle spasms, Decreased range of motion, Decreased coordination  Visit Diagnosis: Muscle weakness (generalized)  Pain in thoracic spine     Problem List Patient Active Problem List   Diagnosis Date Noted  . Rhinitis, allergic 01/18/2016  . Rash and nonspecific skin eruption 10/03/2015  . Acute sinusitis 09/07/2015  . Lumbar compression fracture ([place] 06/20/2015  . Increased pressure in the eye 06/02/2014  . Encounter for Medicare annual wellness exam 05/13/2013  . Fibromyalgia 02/26/2013  . Allergic rhinitis due to pollen 11/20/2010  . 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 IRRITABLE BOWEL SYNDROME 04/06/2010  . THYROID NODULE, RIGHT 12/07/2009  . OSTEOARTHRITIS 11/28/2009  . FIBROIDS, UTERUS 05/10/2008  . Insomnia 05/10/2008  . ASYMPTOMATIC POSTMENOPAUSAL STATUS 05/10/2008  . Essential hypertension 01/28/2008  . HYPERCHOLESTEROLEMIA 01/07/2008  . GLAUCOMA 01/07/2008  .  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 GERD 04/14/2007  . Osteoporosis 04/14/2007  . History of colonic polyps 04/14/2007    Blythe Stanford, SPT 02/29/2016, 7:52 PM  [place] [facility] [address] [place], [address] Phone: [phone]   Fax:  [phone]  Name: Samantha [name] MRN: [id number] Date of Birth: 07-26-1946

## 2016-03-05 ENCOUNTER — Encounter: Payer: Self-pay | Admitting: Gynecologic Oncology

## 2016-03-05 ENCOUNTER — Ambulatory Visit: Payer: Medicare Other | Attending: Gynecologic Oncology | Admitting: Gynecologic Oncology

## 2016-03-05 ENCOUNTER — Ambulatory Visit (INDEPENDENT_AMBULATORY_CARE_PROVIDER_SITE_OTHER): Payer: Medicare Other | Admitting: *Deleted

## 2016-03-05 ENCOUNTER — Encounter: Payer: Medicare Other | Admitting: Physical Therapy

## 2016-03-05 VITALS — BP 157/59 | HR 74 | Temp 98.0°F | Resp 18 | Ht 62.0 in | Wt 138.4 lb

## 2016-03-05 DIAGNOSIS — K219 Gastro-esophageal reflux disease without esophagitis: Secondary | ICD-10-CM | POA: Diagnosis not present

## 2016-03-05 DIAGNOSIS — N83201 Unspecified ovarian cyst, right side: Secondary | ICD-10-CM | POA: Diagnosis not present

## 2016-03-05 DIAGNOSIS — D252 Subserosal leiomyoma of uterus: Secondary | ICD-10-CM | POA: Insufficient documentation

## 2016-03-05 DIAGNOSIS — K589 Irritable bowel syndrome without diarrhea: Secondary | ICD-10-CM | POA: Diagnosis not present

## 2016-03-05 DIAGNOSIS — J309 Allergic rhinitis, unspecified: Secondary | ICD-10-CM

## 2016-03-05 DIAGNOSIS — J45909 Unspecified asthma, uncomplicated: Secondary | ICD-10-CM | POA: Diagnosis not present

## 2016-03-05 DIAGNOSIS — I10 Essential (primary) hypertension: Secondary | ICD-10-CM | POA: Insufficient documentation

## 2016-03-05 DIAGNOSIS — N83202 Unspecified ovarian cyst, left side: Secondary | ICD-10-CM | POA: Diagnosis not present

## 2016-03-05 DIAGNOSIS — Z880 Allergy status to penicillin: Secondary | ICD-10-CM | POA: Insufficient documentation

## 2016-03-05 DIAGNOSIS — Z9889 Other specified postprocedural states: Secondary | ICD-10-CM | POA: Diagnosis not present

## 2016-03-05 DIAGNOSIS — M797 Fibromyalgia: Secondary | ICD-10-CM | POA: Diagnosis not present

## 2016-03-05 DIAGNOSIS — Z79899 Other long term (current) drug therapy: Secondary | ICD-10-CM | POA: Insufficient documentation

## 2016-03-05 DIAGNOSIS — I7300 Raynaud's syndrome without gangrene: Secondary | ICD-10-CM | POA: Diagnosis not present

## 2016-03-05 DIAGNOSIS — Z803 Family history of malignant neoplasm of breast: Secondary | ICD-10-CM | POA: Insufficient documentation

## 2016-03-05 DIAGNOSIS — Z888 Allergy status to other drugs, medicaments and biological substances status: Secondary | ICD-10-CM | POA: Diagnosis not present

## 2016-03-05 DIAGNOSIS — N83209 Unspecified ovarian cyst, unspecified side: Secondary | ICD-10-CM

## 2016-03-05 DIAGNOSIS — E7800 Pure hypercholesterolemia, unspecified: Secondary | ICD-10-CM | POA: Insufficient documentation

## 2016-03-05 NOTE — Progress Notes (Signed)
Consult Note: Gyn-Onc  Consult was requested by Samantha. Teofilo Pod for the evaluation of Samantha [name] 71 y.o. female  CC:  Chief Complaint  Patient presents with  . Ovarian Cyst    New Consultation    Assessment/Plan:  Samantha [name]  is a 70 y.o.  year old with a complex right ovarian cyst and normal CA 125.   I discussed with Samantha [name] and her family that I had a low suspicion of malignancy given the description of the ovarian cysts on imaging, the normal CA-125 value, and the fact that she has had a cyst present since 2015 which speaks to a natural history of stability. I discussed the limitations of these indirect measures, and stated that the only way to know definitively if these are malignant is to perform surgical exploration with laparoscopic BSO and pathology of the ovaries. I discussed that biopsy of ovaries is not an appropriate diagnostic tests and explain why. I discussed that she is at a relatively low risk for surgical complications given her good general health, however surgical risk cannot be completely avoided in any patient.  I offered the patient 2 options, firstly she could proceed with laparoscopic BSO, frozen section, possible staging with hysterectomy if malignancy is found. Alternatively it is safe and reasonable to proceed with repeat ultrasound in 3 months and repeat CA-125. If there is an increase in size and complexity of the ovarian cyst and that time or an upward trend in her CA-125 it would justify surgical exploration at that time.  I do not believe her discomfort or pain symptoms are strong enough to justify surgery nor do I think that they're secondary to these ovarian cysts which are the small and have been present for several years preceding her pain symptoms. Given her symptoms being worse with eating and bowel movements and given that she has a history of known diverticulosis I believe that her pelvic symptoms are more likely related to mild  diverticulitis.  The patient will contact Samantha. Sherran Needs office for follow-up ultrasound in 3 months time. She understands that if she decides that she would like to proceed with definitive diagnostic evaluation with surgery she can notify my office at any time.   HPI: Samantha [name] is a 70 year old G3P3 who is seen in consultation at the request of Samantha [name] for right complex ovarian cysts. The patient has a known history of bilateral ovarian cysts and had a diagnostic laparoscopy approximately 10 years ago when biopsy was performed of her ovaries and these were benign.   She last had an [place] in 2015 which per patient showed ovarian cysts.  She began experiencing mild pelvic discomfort and abdominal bloating in the spring of 2017. She feels her discomfort is worse with bowel movements and eating.  She has a known history of diverticulosis.  She underwent a transvaginal US on 02/20/16 with Samantha [name] which showed a uterus measuring 5.6 x 2.5 x 4.2 cm with a 2 x 1.5 cm posterior subserosal fibroid. The left ovary was within normal limits. The right ovary contained multiple cystic areas, the largest with septations and possibly a papillary projection, but no increased blood flow seen. The largest cyst measured 2.7 x 2.2 cm there were 2 other simple appearing cyst within the right ovary which measured 1.8 cm and 1.1 cm. No adnexal masses or free fluid was seen. A CA-125 drawn on 02/20/2016 was normal at 18 units per milliliter.  The patient is fairly healthy.  Her BMI is 25kg.m2. She has asthma, fibromyalgia, irritable bowel syndrome, renal its disease, as of 1 prior abdominal surgery which was a diagnostic laparoscopy possibly in 2007. She has no first-degree family relatives to have a history of malignancy and has remote family history of breast cancer but not in a constellation concerning for hereditary breast ovarian cancer syndrome.  Current Meds:  Outpatient Encounter Prescriptions as of 03/05/2016   Medication Sig  . albuterol (PROVENTIL HFA) 108 (90 BASE) MCG/ACT inhaler Inhale 2 puffs into the lungs every 6 (six) hours as needed.  . Ascorbic Acid (VITAMIN C) 500 MG tablet Take 500 mg by mouth daily.    . bifidobacterium infantis (ALIGN) capsule Take 1 capsule by mouth daily.    Marland Kitchen CALCIUM CITRATE PO Take 1 capsule by mouth daily. 600 mg  . cetirizine (ZYRTEC) 10 MG tablet Take 10 mg by mouth daily.  . Cholecalciferol (VITAMIN D3) 400 UNITS tablet Take 400 Units by mouth 2 (two) times daily with a meal. Patient is taking 50,000 IU for 12 weeks , started 01/10/2016  . Coenzyme Q-10 60 MG CAPS Take 1 capsule by mouth 2 (two) times daily.   . Cyanocobalamin (VITAMIN B-12) 2500 MCG SUBL Place 1 tablet under the tongue daily. Contains Folic Acid, B6, and Biotin  . Ginger, Zingiber officinalis, (GINGER PO) Take by mouth.  . losartan (COZAAR) 50 MG tablet Take 1 tablet (50 mg total) by mouth daily.  . Magnesium Malate POWD (1250mg  tabs) 1 tablet by mouth two times a day  . Manganese 50 MG TABS Take 50 mg by mouth.  . Misc Natural Products (TART CHERRY ADVANCED PO) Take by mouth.  . montelukast (SINGULAIR) 10 MG tablet 1 daily  . NONFORMULARY OR COMPOUNDED ITEM Allergy Vaccine 1:10 Given at [facility]  . Omega-3 Fatty Acids (OMEGA 3 PO) Take one tablet daily  . omeprazole (PRILOSEC) 40 MG capsule Take 1 capsule (40 mg total) by mouth daily.  Marland Kitchen thiamine 100 MG tablet Take 100 mg by mouth daily.    . Triamcinolone Acetonide (NASACORT ALLERGY 24HR NA) Place 1 spray into the nose.  . TURMERIC PO Take by mouth daily.  Marland Kitchen EPINEPHrine 0.3 mg/0.3 mL IJ SOAJ injection Inject 0.3 mLs (0.3 mg total) into the muscle once. (Patient not taking: Reported on 03/05/2016)  . guaiFENesin (MUCINEX) 600 MG 12 hr tablet Take by mouth 2 (two) times daily. Reported on 03/05/2016   No facility-administered encounter medications on file as of 03/05/2016.    Allergy:  Allergies  Allergen Reactions  .  Augmentin [Amoxicillin-Pot Clavulanate] Diarrhea  . Cefuroxime Axetil     REACTION: pt not sure  . Sulfonamide Derivatives     REACTION: pt not sure    Social Hx:   Social History   Social History  . Marital Status: Widowed    Spouse Name: N/A  . Number of Children: 3  . Years of Education: N/A   Occupational History  .      Retired Stage manager)   Social History Main Topics  . Smoking status: Never Smoker   . Smokeless tobacco: Never Used  . Alcohol Use: No  . Drug Use: No  . Sexual Activity: Not on file   Other Topics Concern  . Not on file   Social History Narrative   Lives alone (widowed).   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      Does not have living will.  Does desire CPR.  Does  not want life support for prolonged periods of time if futile.    Past Surgical Hx:  Past Surgical History  Procedure Laterality Date  . Nasal polyp surgery      Polypectomy and septoplasty  . Laparoscopy    . Neck injury  1983  . Stress cardiolite  02/13/2002  . Electrocardiogram  12/04/2006  . Colonoscopy w/ polypectomy      Past Medical Hx:  Past Medical History  Diagnosis Date  . ASTHMA 04/14/2007  . HYPERCHOLESTEROLEMIA 01/07/2008  . HYPERTENSION 01/28/2008  . SINUSITIS 02/10/2009  . ALLERGIC RHINITIS 07/26/2008  . INSOMNIA 05/10/2008  . FIBROIDS, UTERUS 05/10/2008  . OSTEOPOROSIS 04/14/2007  . OSTEOARTHRITIS 11/28/2009  . GERD 04/14/2007  . Irritable bowel syndrome 04/06/2010  . CHEST PAIN 08/26/2008  . HYPERGLYCEMIA 11/28/2009  . 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 ASYMPTOMATIC POSTMENOPAUSAL STATUS 05/10/2008  . 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 Episcleritis   . Fibromyalgia     Past Gynecological History:  SVD x 3. Diagnostic laparoscopy 2007 for ovarian cysts  No LMP recorded. Patient is postmenopausal.  Family Hx:  Family History  Problem Relation Age of Onset  . Heart disease Father     CHF  . Cancer Neg Hx     No FH of Colon Cancer     Review of Systems:  Constitutional  Feels well,    ENT Normal appearing ears and nares bilaterally Skin/Breast  No rash, sores, jaundice, itching, dryness Cardiovascular  No chest pain, shortness of breath, or edema  Pulmonary  No cough or wheeze.  Gastro Intestinal  No nausea, vomitting, or diarrhoea. No bright red blood per rectum, no abdominal pain, change in bowel movement, or constipation.  Genito Urinary  No frequency, urgency, dysuria, mild pelvic pain Musculo Skeletal  No myalgia, arthralgia, joint swelling or pain  Neurologic  No weakness, numbness, change in gait,  Psychology  No depression, anxiety, insomnia.   Vitals:  Blood pressure 157/59, pulse 74, temperature 98 F (36.7 C), temperature source Oral, resp. rate 18, height 5\' 2"  (1.575 m), weight 138 lb 6.4 oz (62.778 kg), SpO2 99 %.  Physical Exam: WD in NAD Neck  Supple NROM, without any enlargements.  Lymph Node Survey No cervical supraclavicular or inguinal adenopathy Cardiovascular  Pulse normal rate, regularity and rhythm. S1 and S2 normal.  Lungs  Clear to auscultation bilateraly, without wheezes/crackles/rhonchi. Good air movement.  Skin  No rash/lesions/breakdown  Psychiatry  Alert and oriented to person, place, and time  Abdomen  Normoactive bowel sounds, abdomen soft, non-tender and nonobese without evidence of hernia.  Back No CVA tenderness Genito Urinary  Vulva/vagina: Normal external female genitalia.  No lesions. No discharge or bleeding.  Bladder/urethra:  No lesions or masses, well supported bladder  Vagina: normal  Cervix: Normal appearing, no lesions.  Uterus:  Small, mobile, no parametrial involvement or nodularity.  Adnexa: no palpable masses. There is some mild tenderness in the RLQ Rectal  deferred.  Extremities  No bilateral cyanosis, clubbing or edema.   Donaciano Eva, MD  03/05/2016, 9:47 AM

## 2016-03-05 NOTE — Patient Instructions (Signed)
Follow up with Dr Radene Knee in 3 month for a follow up U/S , call with any changes and we will schedule you to see Dr Everitt Amber to discuss surgery as discussed during your initial consultation.  Thank you!

## 2016-03-07 ENCOUNTER — Encounter: Payer: Self-pay | Admitting: Physical Therapy

## 2016-03-07 ENCOUNTER — Ambulatory Visit: Payer: Medicare Other | Admitting: Physical Therapy

## 2016-03-07 DIAGNOSIS — M546 Pain in thoracic spine: Secondary | ICD-10-CM

## 2016-03-07 DIAGNOSIS — M6281 Muscle weakness (generalized): Secondary | ICD-10-CM

## 2016-03-07 NOTE — Therapy (Signed)
[facility] [address] [place], [address] Phone: [phone]   Fax:  [phone]  Physical Therapy Treatment  Patient Details  Name: Samantha [name] MRN: [id number] Date of Birth: Jan 18, 1946 Referring Provider: Cy Blamer, MD  Encounter Date: 03/07/2016      PT End of Session - 03/07/16 1801    Visit Number 17   Number of Visits 24   Date for PT Re-Evaluation 03/26/16   Authorization Type 17   Authorization Time Period 20 (G code)   PT Start Time 8032   PT Stop Time 1224   PT Time Calculation (min) 44 min   Activity Tolerance Patient tolerated treatment well   Behavior During Therapy [facility] for tasks assessed/performed      Past Medical History  Diagnosis Date  . ASTHMA 04/14/2007  . HYPERCHOLESTEROLEMIA 01/07/2008  . HYPERTENSION 01/28/2008  . SINUSITIS 02/10/2009  . ALLERGIC RHINITIS 07/26/2008  . INSOMNIA 05/10/2008  . FIBROIDS, UTERUS 05/10/2008  . OSTEOPOROSIS 04/14/2007  . OSTEOARTHRITIS 11/28/2009  . GERD 04/14/2007  . Irritable bowel syndrome 04/06/2010  . CHEST PAIN 08/26/2008  . HYPERGLYCEMIA 11/28/2009  . 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 ASYMPTOMATIC POSTMENOPAUSAL STATUS 05/10/2008  . 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 Episcleritis   . Fibromyalgia     Past Surgical History  Procedure Laterality Date  . Nasal polyp surgery      Polypectomy and septoplasty  . Laparoscopy    . Neck injury  1983  . Stress cardiolite  02/13/2002  . Electrocardiogram  12/04/2006  . Colonoscopy w/ polypectomy      There were no vitals filed for this visit.      Subjective Assessment - 03/07/16 1534    Subjective Patient reports minimal mid back pain on Tuesday but it is decreased today.    Pertinent History History of Fibromyalgia; Pain stated "over 10 years ago" and had a recent aggravation of symptoms "sometime last year".   Limitations Sitting   How long can you sit  comfortably? 1 hour   Diagnostic tests X-Ray: Positive for previous compression fracture (per pt report)   Patient Stated Goals To decrease the mid back pain.    Currently in Pain? No/denies   Pain Onset More than a month ago      Objective: Observation: Sitting posture: slight forward head posture with forward rounded shoulders.  Palpation: Increased tenderness to palpation along thoracic extensors and upper traps -- improved today  Treatment:   Therapeutic Exercise: Patient performed exercises with guidance, verbal and tactile cues and demonstration of therapist:  Standing scapular retraction at [place] -- bilaterally x20-- 10# Seated thoracic ball roll outs -- x20 (with red physioball) TRX pull ups from the chair -- x25 UBE in standing reversing direction every x15sec -- 5 min Standing rotations with shoulder pulley -- 3 min  Wall angels facing wall -- x10 Wall push up with PLUS -- x10  Manual Therapy:  STM to bilateral upper traps and thoracic extensors utilizing deep and superficial techniques to decrease muscle spasms and guarding with patient positioned in massage chair.   Patient response to treatment: Decreased pain and spasms by 33% after performing STM over the upper traps and thoracic extensors. Good demonstration of TRX pull ups from the chair requiring minimal cueing of shoulder position to perform with proper technique.          PT Education - 03/08/16 0801    Education  provided Yes   Education Details HEP: TRX pull ups from the chair   Person(s) Educated Patient   Methods Explanation;Demonstration   Comprehension Verbalized understanding;Returned demonstration             PT Long Term Goals - 02/13/16 1520    PT LONG TERM GOAL #1   Title Pt will score under 20% on the modified oswestry by 03/26/16 to demonstrate significant improvement in mid/low back function and improved ability to perform lifting activities.   Baseline Modified Oswestry: 42%   (02/07/16 31% partially met)   Status Revised   PT LONG TERM GOAL #2   Title Pt will be independent with HEP performance and progression aimed towards improving thoracic muscular endurance, coordination, and strength by 03/26/16 to continue improvements after discharge from therapy.   Baseline Dependent with exercises performance and progression (minimal cuing required for correct technique and appropriate intensity)   Status On-going   PT LONG TERM GOAL #3   Title Patient will be able to sit for over 1 hour without experiencing thoracic spine pain by 03/26/16 to better be able to write at home without onset of pain.    Baseline Pain is significantly increased after 1 hour. ( patient reports she is able ot sit 1-2 hours before onset of pain: intermittently)   Status Revised               Plan - 03/07/16 1925    Clinical Impression Statement Continued to focus on improving muscular endurance within the scapular musculature and patient demonstrates increased fatigue at end of exercise performance indicating decreased endurance and patient will benefit from further skilled therapy to return to prior level of function.    Rehab Potential Good   PT Frequency 2x / week   PT Duration 6 weeks   PT Treatment/Interventions Electrical Stimulation;Iontophoresis 42m/ml Dexamethasone;Moist Heat;Therapeutic activities;Therapeutic exercise;Ultrasound;Neuromuscular re-education;Patient/family education;Manual techniques;Passive range of motion   PT Next Visit Plan manual therapy, progressive exercises for strenth and flexibility, scapular stabilization   PT Home Exercise Plan continue as instructed, added wall push ups x 10 1x/day      Patient will benefit from skilled therapeutic intervention in order to improve the following deficits and impairments:  Decreased strength, Postural dysfunction, Decreased mobility, Hypomobility, Pain, Decreased endurance, Increased muscle spasms, Decreased range of motion,  Decreased coordination  Visit Diagnosis: Muscle weakness (generalized)  Pain in thoracic spine     Problem List Patient Active Problem List   Diagnosis Date Noted  . Rhinitis, allergic 01/18/2016  . Rash and nonspecific skin eruption 10/03/2015  . Acute sinusitis 09/07/2015  . Lumbar compression fracture ([place] 06/20/2015  . Increased pressure in the eye 06/02/2014  . Encounter for Medicare annual wellness exam 05/13/2013  . Fibromyalgia 02/26/2013  . Allergic rhinitis due to pollen 11/20/2010  . 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 IRRITABLE BOWEL SYNDROME 04/06/2010  . THYROID NODULE, RIGHT 12/07/2009  . OSTEOARTHRITIS 11/28/2009  . FIBROIDS, UTERUS 05/10/2008  . Insomnia 05/10/2008  . ASYMPTOMATIC POSTMENOPAUSAL STATUS 05/10/2008  . Essential hypertension 01/28/2008  . HYPERCHOLESTEROLEMIA 01/07/2008  . GLAUCOMA 01/07/2008  . 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 GERD 04/14/2007  . Osteoporosis 04/14/2007  . History of colonic polyps 04/14/2007    WBlythe Stanford SPT 03/08/2016, 9:39 AM  [facility] [address] [place] [address]Phone: [phone]  Fax:  [phone] Name: Samantha LEIDNERMRN: [id number] of Birth: 1Jul 22, 1947

## 2016-03-09 ENCOUNTER — Ambulatory Visit: Payer: Medicare Other | Admitting: Physical Therapy

## 2016-03-09 ENCOUNTER — Encounter: Payer: Self-pay | Admitting: Physical Therapy

## 2016-03-09 DIAGNOSIS — M546 Pain in thoracic spine: Secondary | ICD-10-CM

## 2016-03-09 DIAGNOSIS — M6281 Muscle weakness (generalized): Secondary | ICD-10-CM

## 2016-03-09 NOTE — Therapy (Signed)
[facility] [address], [place], [address] Phone: [phone]   Fax:  [phone]  Physical Therapy Treatment/Discharge Summary  Patient Details  Name: Samantha [name] MRN: [id number] Date of Birth: [birth year] Referring Provider: Cy Blamer, MD  Encounter Date: 03/09/2016    Patient began physical therapy 01/02/16 and attended 18 sessions through 03/09/16 with goals achieved and patient independent with home program for self management of symptoms and exercises. Plan discharge from physical therapy.       PT End of Session - 03/09/16 0921    Visit Number 18   Number of Visits 24   Date for PT Re-Evaluation 03/26/16   Authorization Type 18   Authorization Time Period 20 (G code)   PT Start Time 0850   PT Stop Time 0930   PT Time Calculation (min) 40 min   Activity Tolerance Patient tolerated treatment well   Behavior During Therapy [facility] for tasks assessed/performed      Past Medical History  Diagnosis Date  . ASTHMA 04/14/2007  . HYPERCHOLESTEROLEMIA 01/07/2008  . HYPERTENSION 01/28/2008  . SINUSITIS 02/10/2009  . ALLERGIC RHINITIS 07/26/2008  . INSOMNIA 05/10/2008  . FIBROIDS, UTERUS 05/10/2008  . OSTEOPOROSIS 04/14/2007  . OSTEOARTHRITIS 11/28/2009  . GERD 04/14/2007  . Irritable bowel syndrome 04/06/2010  . CHEST PAIN 08/26/2008  . HYPERGLYCEMIA 11/28/2009  . 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 ASYMPTOMATIC POSTMENOPAUSAL STATUS 05/10/2008  . 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 Episcleritis   . Fibromyalgia     Past Surgical History  Procedure Laterality Date  . Nasal polyp surgery      Polypectomy and septoplasty  . Laparoscopy    . Neck injury  1983  . Stress cardiolite  02/13/2002  . Electrocardiogram  12/04/2006  . Colonoscopy w/ polypectomy      There were no vitals filed for this visit.      Subjective Assessment - 03/09/16 0859    Subjective Patient reports she is  prepared for discharge. Patient reports she is 95% percent better compared to her first visit.    Pertinent History History of Fibromyalgia; Pain stated "over 10 years ago" and had a recent aggravation of symptoms "sometime last year".   Limitations Sitting   How long can you sit comfortably? 2 hours   Diagnostic tests X-Ray: Positive for previous compression fracture (per pt report)   Patient Stated Goals To decrease the mid back pain.    Currently in Pain? No/denies   Pain Onset More than a month ago      Objective: Observation: Sitting posture: slight forward head posture with forward rounded shoulders.   Outcome Measures:  MODI: 16% (minimal self perceived back dysfunction)  Treatment:  Therapeutic Exercise: Patient performed exercises with guidance, verbal and tactile cues and demonstration of therapist:   Seated thoracic ball roll outs -- x20 (with red physioball) TRX pull ups from the chair -- x25 TRX push up througout shortened range UBE in standing reversing direction every x15sec -- 5 min Standing rotations with shoulder pulley -- 3 min  Scapular endurance program -- x5-10 in each position Standing straight arm push downs at [place] -- x 20 Standing scapular retraction at [place] -- bilaterally x20-- 10#   Patient response to treatment: No aggravation of pain and symptoms during or after treatment. Good demonstration on exercise endurance program requiring minimal cueing on scapular positioning to perform with correct form.  PT Education - 03/17/2016 1226    Education provided Yes   Education Details Educated to maintain HEP 3-5xweek; HEP: Scapular endurance series   Person(s) Educated Patient   Methods Explanation;Demonstration;Handout   Comprehension Verbalized understanding;Returned demonstration             PT Long Term Goals - 03/17/2016 0905    PT LONG TERM GOAL #1   Title Pt will score under 20% on the modified oswestry by 03/26/16 to  demonstrate significant improvement in mid/low back function and improved ability to perform lifting activities.   Baseline Modified Oswestry: 42%  (02/07/16 31% partially met) (March 17, 2016: MODI: 16%)   Status Achieved   PT LONG TERM GOAL #2   Title Pt will be independent with HEP performance and progression aimed towards improving thoracic muscular endurance, coordination, and strength by 03/26/16 to continue improvements after discharge from therapy.   Baseline Dependent with exercises performance and progression (minimal cuing required for correct technique and appropriate intensity) (2016/03/17: Independent for  exercise performance and progression)   Status Achieved   PT LONG TERM GOAL #3   Title Patient will be able to sit for over 1 hour without experiencing thoracic spine pain by 03/26/16 to better be able to write at home without onset of pain.    Baseline Pain is significantly increased after 1 hour. ( patient reports she is able ot sit 1-2 hours before onset of pain: intermittently) (2016-03-17: Can sit comfortably for 2 hours before onset of pain)   Status Achieved               Plan - 03/17/16 1232    Clinical Impression Statement Patient has met all long term goals demonstrating significant improvement in thoracic function as demonstrated by an increase in MODI score (16% dysfunction), improved ability to sit for longer periods of time, and improved muscular strength/endurance of the scapular/thoracic musculature. Patient is independent with HEP and is to be discharged from physical therapy.    Rehab Potential Good   PT Frequency 2x / week   PT Duration 6 weeks   PT Treatment/Interventions Electrical Stimulation;Iontophoresis 97m/ml Dexamethasone;Moist Heat;Therapeutic activities;Therapeutic exercise;Ultrasound;Neuromuscular re-education;Patient/family education;Manual techniques;Passive range of motion   PT Next Visit Plan manual therapy, progressive exercises for strenth and  flexibility, scapular stabilization   PT Home Exercise Plan continue as instructed, added wall push ups x 10 1x/day      Patient will benefit from skilled therapeutic intervention in order to improve the following deficits and impairments:  Decreased strength, Postural dysfunction, Decreased mobility, Hypomobility, Pain, Decreased endurance, Increased muscle spasms, Decreased range of motion, Decreased coordination  Visit Diagnosis: Muscle weakness (generalized)  Pain in thoracic spine       G-Codes - 0Jul 08, 20171354    Functional Assessment Tool Used Modified Oswestry, pain scale, ROM, strength, clinical judgement   Functional Limitation Changing and maintaining body position   Changing and Maintaining Body Position Goal Status ((Y1856 At least 1 percent but less than 20 percent impaired, limited or restricted   Changing and Maintaining Body Position Discharge Status ((D1497 At least 1 percent but less than 20 percent impaired, limited or restricted      Problem List Patient Active Problem List   Diagnosis Date Noted  . Rhinitis, allergic 01/18/2016  . Rash and nonspecific skin eruption 10/03/2015  . Acute sinusitis 09/07/2015  . [place]bar compression fracture ([place] 06/20/2015  . Increased pressure in the eye 06/02/2014  . Encounter for Medicare annual wellness exam 05/13/2013  . Fibromyalgia  02/26/2013  . Allergic rhinitis due to pollen 11/20/2010  . 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 IRRITABLE BOWEL SYNDROME 04/06/2010  . THYROID NODULE, RIGHT 12/07/2009  . OSTEOARTHRITIS 11/28/2009  . FIBROIDS, UTERUS 05/10/2008  . Insomnia 05/10/2008  . ASYMPTOMATIC POSTMENOPAUSAL STATUS 05/10/2008  . Essential hypertension 01/28/2008  . HYPERCHOLESTEROLEMIA 01/07/2008  . GLAUCOMA 01/07/2008  . 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 GERD 04/14/2007  . Osteoporosis 04/14/2007  . History of colonic polyps 04/14/2007    Blythe Stanford, SPT 03/09/2016, 1:55 PM  [facility] [facility] [address] [place], [address] Phone: [phone]   Fax:  [phone]  Name: QUANITA BARONA MRN: [id number] Date of Birth: 01-Nov-1945

## 2016-03-12 ENCOUNTER — Ambulatory Visit (INDEPENDENT_AMBULATORY_CARE_PROVIDER_SITE_OTHER): Payer: Medicare Other | Admitting: *Deleted

## 2016-03-12 DIAGNOSIS — J309 Allergic rhinitis, unspecified: Secondary | ICD-10-CM

## 2016-03-14 ENCOUNTER — Encounter: Payer: Medicare Other | Admitting: Physical Therapy

## 2016-03-15 ENCOUNTER — Encounter: Payer: Self-pay | Admitting: Family Medicine

## 2016-03-15 ENCOUNTER — Ambulatory Visit (INDEPENDENT_AMBULATORY_CARE_PROVIDER_SITE_OTHER): Payer: Medicare Other | Admitting: Family Medicine

## 2016-03-15 VITALS — BP 132/70 | HR 59 | Temp 97.6°F | Wt 139.5 lb

## 2016-03-15 DIAGNOSIS — I10 Essential (primary) hypertension: Secondary | ICD-10-CM

## 2016-03-15 NOTE — Progress Notes (Signed)
Pre visit review using our clinic review tool, if applicable. No additional management support is needed unless otherwise documented below in the visit note. 

## 2016-03-15 NOTE — Patient Instructions (Signed)
Great to see you. Please make an appointment to see me and bring your blood pressure machine with you.

## 2016-03-15 NOTE — Assessment & Plan Note (Signed)
Normotensive here. Reassurance provided. Advised to make an appointment for BP check and to bring her cuff with her. Call or return to clinic prn if these symptoms worsen or fail to improve as anticipated. The patient indicates understanding of these issues and agrees with the plan.

## 2016-03-15 NOTE — Progress Notes (Signed)
Subjective:   Patient ID: Samantha [name], female    DOB: [birth year], 70 y.o.   MRN: [id number]  Samantha [name] is a pleasant 70 y.o. year old female who presents to clinic today with Follow-up  on 03/15/2016  HPI:  HTN-  BP has been well controlled on Losartan 50 mg daily until recently.  At cancer center, BP was elevated into [id number]- A999333 systolically.  At home, BP cuff is ranging higher than usual as well.  Previously was on Losartan 100 mg daily but decreased to 50 mg daily due to hypotension.  She has had some headaches and increased pain (just finished therapy for compression fx).    Current Outpatient Prescriptions on File Prior to Visit  Medication Sig Dispense Refill  . albuterol (PROVENTIL HFA) 108 (90 BASE) MCG/ACT inhaler Inhale 2 puffs into the lungs every 6 (six) hours as needed. 3 Inhaler 3  . Ascorbic Acid (VITAMIN C) 500 MG tablet Take 500 mg by mouth daily.      . bifidobacterium infantis (ALIGN) capsule Take 1 capsule by mouth daily.      . cetirizine (ZYRTEC) 10 MG tablet Take 10 mg by mouth daily.    . Cholecalciferol (VITAMIN D3) 400 UNITS tablet Take 400 Units by mouth 2 (two) times daily with a meal. Patient is taking 50,000 IU for 12 weeks , started 01/10/2016    . Coenzyme Q-10 60 MG CAPS Take 1 capsule by mouth 2 (two) times daily.     . Cyanocobalamin (VITAMIN B-12) 2500 MCG SUBL Place 1 tablet under the tongue daily. Contains Folic Acid, B6, and Biotin    . EPINEPHrine 0.3 mg/0.3 mL IJ SOAJ injection Inject 0.3 mLs (0.3 mg total) into the muscle once. 1 Device 1  . Ginger, Zingiber officinalis, (GINGER PO) Take by mouth.    Marland Kitchen guaiFENesin (MUCINEX) 600 MG 12 hr tablet Take by mouth 2 (two) times daily. Reported on 03/05/2016    . losartan (COZAAR) 50 MG tablet Take 1 tablet (50 mg total) by mouth daily. 90 tablet 3  . Magnesium Malate POWD (1250mg  tabs) 1 tablet by mouth two times a day    . Manganese 50 MG TABS Take 50 mg by mouth.    . Misc Natural  Products (TART CHERRY ADVANCED PO) Take by mouth.    . montelukast (SINGULAIR) 10 MG tablet 1 daily 90 tablet 3  . NONFORMULARY OR COMPOUNDED ITEM Allergy Vaccine 1:10 Given at [facility]    . Omega-3 Fatty Acids (OMEGA 3 PO) Take one tablet daily    . omeprazole (PRILOSEC) 40 MG capsule Take 1 capsule (40 mg total) by mouth daily. 90 capsule 3  . thiamine 100 MG tablet Take 100 mg by mouth daily.      . Triamcinolone Acetonide (NASACORT ALLERGY 24HR NA) Place 1 spray into the nose.    . TURMERIC PO Take by mouth daily.     No current facility-administered medications on file prior to visit.    Allergies  Allergen Reactions  . Augmentin [Amoxicillin-Pot Clavulanate] Diarrhea  . Cefuroxime Axetil     REACTION: pt not sure  . Sulfonamide Derivatives     REACTION: pt not sure    Past Medical History  Diagnosis Date  . ASTHMA 04/14/2007  . HYPERCHOLESTEROLEMIA 01/07/2008  . HYPERTENSION 01/28/2008  . SINUSITIS 02/10/2009  . ALLERGIC RHINITIS 07/26/2008  . INSOMNIA 05/10/2008  . FIBROIDS, UTERUS 05/10/2008  . OSTEOPOROSIS 04/14/2007  . OSTEOARTHRITIS 11/28/2009  . GERD  04/14/2007  . Irritable bowel syndrome 04/06/2010  . CHEST PAIN 08/26/2008  . HYPERGLYCEMIA 11/28/2009  . 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 ASYMPTOMATIC POSTMENOPAUSAL STATUS 05/10/2008  . 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 Episcleritis   . Fibromyalgia     Past Surgical History  Procedure Laterality Date  . Nasal polyp surgery      Polypectomy and septoplasty  . Laparoscopy    . Neck injury  1983  . Stress cardiolite  02/13/2002  . Electrocardiogram  12/04/2006  . Colonoscopy w/ polypectomy      Family History  Problem Relation Age of Onset  . Heart disease Father     CHF  . Cancer Neg Hx     No FH of Colon Cancer    Social History   Social History  . Marital Status: Widowed    Spouse Name: N/A  . Number of Children: 3  . Years of Education: N/A   Occupational History  .        Retired Stage manager)   Social History Main Topics  . Smoking status: Never Smoker   . Smokeless tobacco: Never Used  . Alcohol Use: No  . Drug Use: No  . Sexual Activity: Not on file   Other Topics Concern  . Not on file   Social History Narrative   Lives alone (widowed).   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      Does not have living will.  Does desire CPR.  Does not want life support for prolonged periods of time if futile.   The PMH, PSH, Social History, Family History, Medications, and allergies have been reviewed in [facility], and have been updated if relevant.   Review of Systems  Eyes: Negative for visual disturbance.  Neurological: Positive for headaches. Negative for dizziness, tremors, seizures, syncope, facial asymmetry, speech difficulty, weakness, light-headedness and numbness.  All other systems reviewed and are negative.      Objective:    BP 132/70 mmHg  Pulse 59  Temp(Src) 97.6 F (36.4 C) (Oral)  Wt 139 lb 8 oz (63.277 kg)  SpO2 99%  BP Readings from Last 3 Encounters:  03/15/16 132/70  03/05/16 157/59  01/03/16 138/70     Physical Exam  Constitutional: She is oriented to person, place, and time. She appears well-developed and well-nourished. No distress.  HENT:  Head: Normocephalic.  Eyes: Conjunctivae are normal.  Cardiovascular: Normal rate and regular rhythm.   Pulmonary/Chest: Effort normal and breath sounds normal.  Neurological: She is alert and oriented to person, place, and time. No cranial nerve deficit.  Skin: Skin is warm and dry. She is not diaphoretic.  Psychiatric: She has a normal mood and affect. Her behavior is normal. Judgment and thought content normal.  Nursing note and vitals reviewed.         Assessment & Plan:   Essential hypertension No Follow-up on file.

## 2016-03-16 ENCOUNTER — Telehealth: Payer: Self-pay

## 2016-03-16 ENCOUNTER — Ambulatory Visit: Payer: Medicare Other | Admitting: Physical Therapy

## 2016-03-16 NOTE — Telephone Encounter (Signed)
Incoming call from Ms Odonnel inquiring if Dr Everitt Amber had forward her note to Dr Kennedy Bucker ( Gastroenterology ) [facility] : [phone]  Fax : [phone] in regards to my diagnosis of diverticulosis -vs- diverticulitis . Writer informed the patient that we would fax the note to Dr Fuller Plan and call his office as well to have them schedule an appointment for evaluation . Dr Everitt Amber note was faxed , transmission verification report received back "OK" Dr Lynne Leader office was contacted spoke Lorriane Shire and the patient will be contacted to be scheduled. Patient is aware ,denies further questions at this time.

## 2016-03-19 ENCOUNTER — Ambulatory Visit (INDEPENDENT_AMBULATORY_CARE_PROVIDER_SITE_OTHER): Payer: Medicare Other | Admitting: Family Medicine

## 2016-03-19 ENCOUNTER — Ambulatory Visit (INDEPENDENT_AMBULATORY_CARE_PROVIDER_SITE_OTHER): Payer: Medicare Other

## 2016-03-19 ENCOUNTER — Encounter: Payer: Self-pay | Admitting: Family Medicine

## 2016-03-19 VITALS — BP 142/70 | HR 80 | Temp 98.3°F | Wt 140.5 lb

## 2016-03-19 DIAGNOSIS — I10 Essential (primary) hypertension: Secondary | ICD-10-CM

## 2016-03-19 DIAGNOSIS — J309 Allergic rhinitis, unspecified: Secondary | ICD-10-CM

## 2016-03-19 DIAGNOSIS — K5790 Diverticulosis of intestine, part unspecified, without perforation or abscess without bleeding: Secondary | ICD-10-CM | POA: Diagnosis not present

## 2016-03-19 NOTE — Patient Instructions (Signed)
Good to see you. Try OMRON for your blood pressure machine.

## 2016-03-19 NOTE — Assessment & Plan Note (Signed)
Normotensive. BP cuff off by 15 points systolically. No changes made to rxs. Advised that she get a new BP cuff or get this one calibrated. The patient indicates understanding of these issues and agrees with the plan.

## 2016-03-19 NOTE — Progress Notes (Signed)
On her way out, asked for a referral to get a second opinion by Dr. Earlean Shawl for her diverticulosis/colon issues.  Referral placed.

## 2016-03-19 NOTE — Progress Notes (Signed)
Pre visit review using our clinic review tool, if applicable. No additional management support is needed unless otherwise documented below in the visit note. 

## 2016-03-19 NOTE — Progress Notes (Signed)
Subjective:   Patient ID: Samantha [name], female    DOB: 1945/11/04, 70 y.o.   MRN: [id number]  Samantha [name] is a pleasant 70 y.o. year old female who presents to clinic today with Follow-up  on 03/19/2016  HPI:  HTN-  BP has been well controlled on Losartan 50 mg daily until recently.  At cancer center, BP was elevated into [id number]- [id number] systolically.  At home, BP cuff is ranging higher than usual as well.  Previously was on Losartan 100 mg daily but decreased to 50 mg daily due to hypotension.  Since she has been normotensive here, I advised that she come to have her BP rechecked with her home BP cuff.  She brings it in here today.    Current Outpatient Prescriptions on File Prior to Visit  Medication Sig Dispense Refill  . albuterol (PROVENTIL HFA) 108 (90 BASE) MCG/ACT inhaler Inhale 2 puffs into the lungs every 6 (six) hours as needed. 3 Inhaler 3  . Ascorbic Acid (VITAMIN C) 500 MG tablet Take 500 mg by mouth daily.      . bifidobacterium infantis (ALIGN) capsule Take 1 capsule by mouth daily.      . cetirizine (ZYRTEC) 10 MG tablet Take 10 mg by mouth daily.    . Cholecalciferol (VITAMIN D3) 400 UNITS tablet Take 400 Units by mouth 2 (two) times daily with a meal. Patient is taking 50,000 IU for 12 weeks , started 01/10/2016    . Coenzyme Q-10 60 MG CAPS Take 1 capsule by mouth 2 (two) times daily.     . Cyanocobalamin (VITAMIN B-12) 2500 MCG SUBL Place 1 tablet under the tongue daily. Contains Folic Acid, B6, and Biotin    . EPINEPHrine 0.3 mg/0.3 mL IJ SOAJ injection Inject 0.3 mLs (0.3 mg total) into the muscle once. 1 Device 1  . Ginger, Zingiber officinalis, (GINGER PO) Take by mouth.    Marland Kitchen guaiFENesin (MUCINEX) 600 MG 12 hr tablet Take by mouth 2 (two) times daily. Reported on 03/05/2016    . losartan (COZAAR) 50 MG tablet Take 1 tablet (50 mg total) by mouth daily. 90 tablet 3  . Magnesium Malate POWD (1250mg  tabs) 1 tablet by mouth two times a day    . Manganese 50 MG  TABS Take 50 mg by mouth.    . Misc Natural Products (TART CHERRY ADVANCED PO) Take by mouth.    . montelukast (SINGULAIR) 10 MG tablet 1 daily 90 tablet 3  . NONFORMULARY OR COMPOUNDED ITEM Allergy Vaccine 1:10 Given at [facility]    . Omega-3 Fatty Acids (OMEGA 3 PO) Take one tablet daily    . omeprazole (PRILOSEC) 40 MG capsule Take 1 capsule (40 mg total) by mouth daily. 90 capsule 3  . thiamine 100 MG tablet Take 100 mg by mouth daily.      . Triamcinolone Acetonide (NASACORT ALLERGY 24HR NA) Place 1 spray into the nose.    . TURMERIC PO Take by mouth daily.     No current facility-administered medications on file prior to visit.    Allergies  Allergen Reactions  . Augmentin [Amoxicillin-Pot Clavulanate] Diarrhea  . Cefuroxime Axetil     REACTION: pt not sure  . Sulfonamide Derivatives     REACTION: pt not sure    Past Medical History  Diagnosis Date  . ASTHMA 04/14/2007  . HYPERCHOLESTEROLEMIA 01/07/2008  . HYPERTENSION 01/28/2008  . SINUSITIS 02/10/2009  . ALLERGIC RHINITIS 07/26/2008  . INSOMNIA 05/10/2008  .  FIBROIDS, UTERUS 05/10/2008  . OSTEOPOROSIS 04/14/2007  . OSTEOARTHRITIS 11/28/2009  . GERD 04/14/2007  . Irritable bowel syndrome 04/06/2010  . CHEST PAIN 08/26/2008  . HYPERGLYCEMIA 11/28/2009  . 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 ASYMPTOMATIC POSTMENOPAUSAL STATUS 05/10/2008  . 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 Episcleritis   . Fibromyalgia     Past Surgical History  Procedure Laterality Date  . Nasal polyp surgery      Polypectomy and septoplasty  . Laparoscopy    . Neck injury  1983  . Stress cardiolite  02/13/2002  . Electrocardiogram  12/04/2006  . Colonoscopy w/ polypectomy      Family History  Problem Relation Age of Onset  . Heart disease Father     CHF  . Cancer Neg Hx     No FH of Colon Cancer    Social History   Social History  . Marital Status: Widowed    Spouse Name: N/A  . Number of Children: 3  . Years of  Education: N/A   Occupational History  .      Retired Stage manager)   Social History Main Topics  . Smoking status: Never Smoker   . Smokeless tobacco: Never Used  . Alcohol Use: No  . Drug Use: No  . Sexual Activity: Not on file   Other Topics Concern  . Not on file   Social History Narrative   Lives alone (widowed).   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      Does not have living will.  Does desire CPR.  Does not want life support for prolonged periods of time if futile.   The PMH, PSH, Social History, Family History, Medications, and allergies have been reviewed in [facility], and have been updated if relevant.   Review of Systems  Eyes: Negative for visual disturbance.  Neurological: Negative for dizziness, tremors, seizures, syncope, facial asymmetry, speech difficulty, weakness, light-headedness, numbness and headaches.  All other systems reviewed and are negative.      Objective:    BP 146/70 mmHg  Pulse 80  Temp(Src) 98.3 F (36.8 C) (Oral)  Wt 140 lb 8 oz (63.73 kg)  SpO2 97%  BP Readings from Last 3 Encounters:  03/19/16 146/70  03/15/16 132/70  03/05/16 157/59     Physical Exam  Constitutional: She is oriented to person, place, and time. She appears well-developed and well-nourished. No distress.  HENT:  Head: Normocephalic.  Eyes: Conjunctivae are normal.  Cardiovascular: Normal rate and regular rhythm.   Pulmonary/Chest: Effort normal and breath sounds normal.  Neurological: She is alert and oriented to person, place, and time. No cranial nerve deficit.  Skin: Skin is warm and dry. She is not diaphoretic.  Psychiatric: She has a normal mood and affect. Her behavior is normal. Judgment and thought content normal.  Nursing note and vitals reviewed.         Assessment & Plan:   Essential hypertension No Follow-up on file.

## 2016-03-21 ENCOUNTER — Encounter: Payer: Medicare Other | Admitting: Physical Therapy

## 2016-03-23 ENCOUNTER — Encounter: Payer: Medicare Other | Admitting: Physical Therapy

## 2016-03-26 ENCOUNTER — Encounter: Payer: Medicare Other | Admitting: Physical Therapy

## 2016-03-28 ENCOUNTER — Ambulatory Visit (INDEPENDENT_AMBULATORY_CARE_PROVIDER_SITE_OTHER): Payer: Medicare Other | Admitting: *Deleted

## 2016-03-28 ENCOUNTER — Encounter: Payer: Medicare Other | Admitting: Physical Therapy

## 2016-03-28 DIAGNOSIS — J309 Allergic rhinitis, unspecified: Secondary | ICD-10-CM | POA: Diagnosis not present

## 2016-03-29 ENCOUNTER — Telehealth: Payer: Self-pay | Admitting: Internal Medicine

## 2016-03-29 NOTE — Telephone Encounter (Signed)
Pt.came in for her allergy shot yesterday, that was the last shot out of her vial. I ask her if the plan was to cont. Her vac.. I ask her if she had seen you recently,"no".  So I asked her if she had an appt. Coming up anytime soon, yes Mon.04/02/16. I tried to explain to her several times  you would discuss her vac. With her then. She kept asking why what's going on. I told her you were going to retire. "No, all of my good Dr.s are retiring." Please let me know if you are going to continue her vac. For now. I will not share this info with her (b/c she's not right in front me).  I just need to know so I'll know wether or not to make up her vac. I can't make it up the day she has her visit, ins. won't pay. Pt. Stated she can't come next wk.. Please advise. I will not call her. As I said before you'll discuss that with her.

## 2016-03-30 NOTE — Telephone Encounter (Signed)
Ok to refill her vaccine as discussed

## 2016-03-30 NOTE — Telephone Encounter (Signed)
Cont. Vac. Through the end of the yr. (per CY) Nothing further needed.

## 2016-04-02 ENCOUNTER — Encounter: Payer: Self-pay | Admitting: Internal Medicine

## 2016-04-02 ENCOUNTER — Ambulatory Visit (INDEPENDENT_AMBULATORY_CARE_PROVIDER_SITE_OTHER): Payer: Medicare Other | Admitting: Internal Medicine

## 2016-04-02 VITALS — BP 122/80 | HR 73 | Ht 62.0 in | Wt 138.4 lb

## 2016-04-02 DIAGNOSIS — J45998 Other asthma: Secondary | ICD-10-CM | POA: Diagnosis not present

## 2016-04-02 DIAGNOSIS — J301 Allergic rhinitis due to pollen: Secondary | ICD-10-CM | POA: Diagnosis not present

## 2016-04-02 DIAGNOSIS — K219 Gastro-esophageal reflux disease without esophagitis: Secondary | ICD-10-CM

## 2016-04-02 MED ORDER — MONTELUKAST SODIUM 10 MG PO TABS: ORAL_TABLET | ORAL | 3 refills | Status: DC

## 2016-04-02 MED ORDER — ALBUTEROL SULFATE HFA 108 (90 BASE) MCG/ACT IN AERS: 2.0000 | INHALATION_SPRAY | Freq: Four times a day (QID) | RESPIRATORY_TRACT | 3 refills | Status: DC | PRN

## 2016-04-02 NOTE — Assessment & Plan Note (Signed)
She is going to get the opinion of another gastroenterologist.

## 2016-04-02 NOTE — Assessment & Plan Note (Signed)
Well-controlled now, not routinely dating rescue inhaler and no sleep disturbance.

## 2016-04-02 NOTE — Patient Instructions (Addendum)
We can continue allergy vaccine here through this year as discussed  You can choose to change to a different allergy office at any time, or wait to see how you do off shots  Refill scripts were sent for inhaler and singulair

## 2016-04-02 NOTE — Assessment & Plan Note (Signed)
We discussed plans to close allergy clinic here as I slow down. She can continue allergy shots through this year./Transfer at any time as discussed. Repeat throat clearing will be self-perpetuating. I suggested she see ENT again, emphasized use of sips of liquids and throat lozenges.

## 2016-04-02 NOTE — Progress Notes (Signed)
Patient ID: Samantha [name], female    DOB: 11/12/1945, 70 y.o.   MRN: [id number]  HPI   03/17/14- 67 yoF never smoker,  Followed for allergic rhinitis and asthma with GERD FOLLOWS FOR: Still on Allergy vaccine 1:10 GH and doing well; having nasal congestion. Minor nasal congestion but feels she is doing well with allergy vaccine. Occasional light cough but no recent wheezing. She drives here from [place] to get her shots and says her drugstore as indicated they might be able to give her allergy shots. She asks about getting Prevnar pneumonia vaccination. We reviewed her history and discussed this.  1/113/16- 67 yoF never smoker,  Followed for allergic rhinitis and asthma with GERD FOLLOWS FOR: Pt states she is doing well overall with allergy injections. She did state she had a rough time in December with her breathing(? bronchitis and SOB)-getting better now. FOLLOWS FOR: Pt states she is doing well overall with allergy injections. She did state she had a rough time in December with her breathing(? bronchitis and SOB)-getting better now.  03/30/15- 68 yoF never smoker,  Followed for allergic rhinitis and asthma with GERD Reports:congested; cough; runny nose;   10/03/2015-70 year old female never smoker followed for allergic rhinitis, asthma, complicated by GERD FOLLOWS FOR: Pt continues allergy injections and no reactions. Pt is having more flare ups lately with allergies or from being around others that are sick-was not able to take allergy shot. Significant bronchitis at Christmas time. Primary provider treated for sinusitis then with Augmentin which was not tolerated because of diarrhea and switched to Z-Pak. Complains of some persistent nasal stuffiness and drainage, bilateral maxillary sinus ache. No fever or purulent discharge. New incidental problem-rash on wrist  04/02/2016-70 year old female never smoker followed for allergic rhinitis, asthma, complicated by GERD Allergy Vaccine 1:10  GH FOLLOWS FOR: Pt still on allergy vaccine; doing well-pt is having slight congestion at this time. Pt needs ot know if its okay to get refill on allergy vaccine as well. Barium swallow 01/09/2016-normal Bothered by persistent throat clearing but nothing comes out. Had not seen ENT in a long time.  Review of Systems-see HPI Constitutional:   No weight loss, night sweats,  Fevers, chills, fatigue, lassitude. HEENT:   No headaches,  Difficulty swallowing,  Tooth/dental problems,  Sore throat,                No sneezing, itching, or +ear ache,   +nasal congestion, post nasal drip, CV:  No- chest pain, orthopnea, PND, swelling in lower extremities, anasarca, dizziness, palpitations GI  No heartburn, indigestion, abdominal pain, nausea, vomiting,  Resp: No shortness of breath with exertion or at rest.  No excess mucus, no productive cough,  No                                No-non-productive cough,  No coughing up of blood.  No change in color of mucus.  +infrequent wheezing.   Skin: Clear GU:  MS:  No joint pain or swelling.   Psych:  No change in mood or affect. No depression or anxiety.  No memory loss.   Objective:   Physical Exam General- Alert, Oriented, Affect-appropriate, Distress- none acute; pleasant and talkative; well appearing Skin- Clear Lymphadenopathy- none Head- atraumatic            Eyes- Gross vision intact, PERRLA, conjunctivae clear secretions, + periorbital edema  Ears- Normal hearing            Nose- + turbinate edema-mild , no-Septal dev, mucus, polyps, erosion, perforation .                        Throat- Malampatti III.   TMs not  retracted , mucosa a little red , drainage- none,                     tonsils- atrophic;  frequent throat clearing, +hoarse Neck- flexible , trachea midline, no stridor , thyroid nl, carotid no bruit Chest - symmetrical excursion , unlabored           Heart/CV- RRR , no murmur , no gallop  , no rub, nl s1 s2                            - JVD- none , edema- none, stasis changes- none, varices- none           Lung-  wheeze or rhonchi,-none cough-none , dullness-none, rub- none           Chest wall-  Abd-  Br/ Gen/ Rectal- Not done, not indicated Extrem- cyanosis- none, clubbing, none, atrophy- none, strength- nl.  Neuro- grossly intact to observation

## 2016-04-03 ENCOUNTER — Telehealth: Payer: Self-pay | Admitting: Internal Medicine

## 2016-04-03 DIAGNOSIS — J309 Allergic rhinitis, unspecified: Secondary | ICD-10-CM | POA: Diagnosis not present

## 2016-04-03 NOTE — Telephone Encounter (Signed)
Allergy Serum Extract Date Mixed: 04/03/16 Vial: 1 Strength: 1:10 Here/Mail/Pick Up: here Mixed By: tbs Last OV: 04/02/16 Pending OV: 10/04/15

## 2016-04-20 ENCOUNTER — Ambulatory Visit (INDEPENDENT_AMBULATORY_CARE_PROVIDER_SITE_OTHER): Payer: Medicare Other | Admitting: *Deleted

## 2016-04-20 DIAGNOSIS — J309 Allergic rhinitis, unspecified: Secondary | ICD-10-CM

## 2016-04-20 DIAGNOSIS — R1310 Dysphagia, unspecified: Secondary | ICD-10-CM | POA: Diagnosis not present

## 2016-04-20 DIAGNOSIS — K219 Gastro-esophageal reflux disease without esophagitis: Secondary | ICD-10-CM | POA: Diagnosis not present

## 2016-04-20 DIAGNOSIS — R1013 Epigastric pain: Secondary | ICD-10-CM | POA: Diagnosis not present

## 2016-04-27 ENCOUNTER — Ambulatory Visit (INDEPENDENT_AMBULATORY_CARE_PROVIDER_SITE_OTHER): Payer: Medicare Other | Admitting: *Deleted

## 2016-04-27 DIAGNOSIS — J309 Allergic rhinitis, unspecified: Secondary | ICD-10-CM

## 2016-05-03 ENCOUNTER — Ambulatory Visit (INDEPENDENT_AMBULATORY_CARE_PROVIDER_SITE_OTHER): Payer: Medicare Other

## 2016-05-03 DIAGNOSIS — J309 Allergic rhinitis, unspecified: Secondary | ICD-10-CM

## 2016-05-07 ENCOUNTER — Ambulatory Visit (INDEPENDENT_AMBULATORY_CARE_PROVIDER_SITE_OTHER): Payer: Medicare Other

## 2016-05-07 ENCOUNTER — Ambulatory Visit (INDEPENDENT_AMBULATORY_CARE_PROVIDER_SITE_OTHER): Payer: Medicare Other | Admitting: *Deleted

## 2016-05-07 ENCOUNTER — Other Ambulatory Visit: Payer: Self-pay

## 2016-05-07 ENCOUNTER — Other Ambulatory Visit: Payer: Medicare Other

## 2016-05-07 DIAGNOSIS — J309 Allergic rhinitis, unspecified: Secondary | ICD-10-CM

## 2016-05-07 DIAGNOSIS — Z23 Encounter for immunization: Secondary | ICD-10-CM

## 2016-05-07 DIAGNOSIS — I959 Hypotension, unspecified: Secondary | ICD-10-CM

## 2016-05-07 MED ORDER — LOSARTAN POTASSIUM 50 MG PO TABS: 50.0000 mg | ORAL_TABLET | Freq: Every day | ORAL | 0 refills | Status: DC

## 2016-05-07 NOTE — Telephone Encounter (Signed)
Pt request # 15 of losartan to [facility] while waiting on mail order; last seen 03/19/16; refill done andpt voiced understanding.

## 2016-05-08 ENCOUNTER — Ambulatory Visit: Payer: Medicare Other

## 2016-05-16 ENCOUNTER — Ambulatory Visit (INDEPENDENT_AMBULATORY_CARE_PROVIDER_SITE_OTHER): Payer: Medicare Other | Admitting: *Deleted

## 2016-05-16 DIAGNOSIS — J309 Allergic rhinitis, unspecified: Secondary | ICD-10-CM

## 2016-05-23 ENCOUNTER — Ambulatory Visit (INDEPENDENT_AMBULATORY_CARE_PROVIDER_SITE_OTHER): Payer: Medicare Other | Admitting: *Deleted

## 2016-05-23 DIAGNOSIS — J309 Allergic rhinitis, unspecified: Secondary | ICD-10-CM

## 2016-05-28 ENCOUNTER — Ambulatory Visit (INDEPENDENT_AMBULATORY_CARE_PROVIDER_SITE_OTHER): Payer: Medicare Other

## 2016-05-28 DIAGNOSIS — J309 Allergic rhinitis, unspecified: Secondary | ICD-10-CM | POA: Diagnosis not present

## 2016-05-28 DIAGNOSIS — R1903 Right lower quadrant abdominal swelling, mass and lump: Secondary | ICD-10-CM | POA: Diagnosis not present

## 2016-05-31 ENCOUNTER — Other Ambulatory Visit: Payer: Self-pay | Admitting: Family Medicine

## 2016-05-31 DIAGNOSIS — E7800 Pure hypercholesterolemia, unspecified: Secondary | ICD-10-CM

## 2016-05-31 DIAGNOSIS — Z0000 Encounter for general adult medical examination without abnormal findings: Secondary | ICD-10-CM

## 2016-06-04 DIAGNOSIS — H40003 Preglaucoma, unspecified, bilateral: Secondary | ICD-10-CM | POA: Diagnosis not present

## 2016-06-06 ENCOUNTER — Ambulatory Visit (INDEPENDENT_AMBULATORY_CARE_PROVIDER_SITE_OTHER): Payer: Medicare Other | Admitting: *Deleted

## 2016-06-06 DIAGNOSIS — J309 Allergic rhinitis, unspecified: Secondary | ICD-10-CM

## 2016-06-07 ENCOUNTER — Other Ambulatory Visit: Payer: Medicare Other

## 2016-06-11 ENCOUNTER — Ambulatory Visit (INDEPENDENT_AMBULATORY_CARE_PROVIDER_SITE_OTHER): Payer: Medicare Other | Admitting: Rheumatology

## 2016-06-11 ENCOUNTER — Ambulatory Visit (INDEPENDENT_AMBULATORY_CARE_PROVIDER_SITE_OTHER): Payer: Medicare Other

## 2016-06-11 DIAGNOSIS — M5030 Other cervical disc degeneration, unspecified cervical region: Secondary | ICD-10-CM

## 2016-06-11 DIAGNOSIS — M5137 Other intervertebral disc degeneration, lumbosacral region: Secondary | ICD-10-CM

## 2016-06-11 DIAGNOSIS — J309 Allergic rhinitis, unspecified: Secondary | ICD-10-CM

## 2016-06-11 DIAGNOSIS — M797 Fibromyalgia: Secondary | ICD-10-CM

## 2016-06-11 DIAGNOSIS — M19041 Primary osteoarthritis, right hand: Secondary | ICD-10-CM

## 2016-06-13 ENCOUNTER — Encounter: Payer: Medicare Other | Admitting: Family Medicine

## 2016-06-20 ENCOUNTER — Ambulatory Visit (INDEPENDENT_AMBULATORY_CARE_PROVIDER_SITE_OTHER): Payer: Medicare Other | Admitting: *Deleted

## 2016-06-20 DIAGNOSIS — J309 Allergic rhinitis, unspecified: Secondary | ICD-10-CM

## 2016-06-27 ENCOUNTER — Ambulatory Visit (INDEPENDENT_AMBULATORY_CARE_PROVIDER_SITE_OTHER): Payer: Medicare Other

## 2016-06-27 ENCOUNTER — Encounter: Payer: Self-pay | Admitting: *Deleted

## 2016-06-27 ENCOUNTER — Other Ambulatory Visit: Payer: Medicare Other

## 2016-06-27 ENCOUNTER — Telehealth: Payer: Self-pay

## 2016-06-27 VITALS — BP 118/68 | HR 69 | Temp 98.0°F | Ht 61.5 in | Wt 138.5 lb

## 2016-06-27 DIAGNOSIS — Z8639 Personal history of other endocrine, nutritional and metabolic disease: Secondary | ICD-10-CM | POA: Diagnosis not present

## 2016-06-27 DIAGNOSIS — E559 Vitamin D deficiency, unspecified: Secondary | ICD-10-CM | POA: Diagnosis not present

## 2016-06-27 DIAGNOSIS — Z1159 Encounter for screening for other viral diseases: Secondary | ICD-10-CM | POA: Diagnosis not present

## 2016-06-27 DIAGNOSIS — Z0000 Encounter for general adult medical examination without abnormal findings: Secondary | ICD-10-CM | POA: Diagnosis not present

## 2016-06-27 DIAGNOSIS — R309 Painful micturition, unspecified: Secondary | ICD-10-CM | POA: Diagnosis not present

## 2016-06-27 DIAGNOSIS — E7800 Pure hypercholesterolemia, unspecified: Secondary | ICD-10-CM

## 2016-06-27 LAB — POC URINALSYSI DIPSTICK (AUTOMATED)
Bilirubin, UA: NEGATIVE
Blood, UA: NEGATIVE
Glucose, UA: NEGATIVE
Ketones, UA: NEGATIVE
Leukocytes, UA: NEGATIVE
Nitrite, UA: NEGATIVE
Protein, UA: NEGATIVE
Spec Grav, UA: 1.02
Urobilinogen, UA: 0.2
pH, UA: 6.5

## 2016-06-27 LAB — LIPID PANEL
Cholesterol: 215 mg/dL — ABNORMAL HIGH (ref 0–200)
HDL: 97.4 mg/dL (ref >39.00)
LDL Cholesterol: 104 mg/dL — ABNORMAL HIGH (ref 0–99)
NonHDL: 117.74
Total CHOL/HDL Ratio: 2
Triglycerides: 71 mg/dL (ref 0.0–149.0)
VLDL: 14.2 mg/dL (ref 0.0–40.0)

## 2016-06-27 LAB — TSH: TSH: 1.9 u[IU]/mL (ref 0.35–4.50)

## 2016-06-27 LAB — COMPREHENSIVE METABOLIC PANEL
ALT: 11 U/L (ref 0–35)
AST: 20 U/L (ref 0–37)
Albumin: 4.4 g/dL (ref 3.5–5.2)
Alkaline Phosphatase: 79 U/L (ref 39–117)
BUN: 14 mg/dL (ref 6–23)
CO2: 30 mEq/L (ref 19–32)
Calcium: 10.2 mg/dL (ref 8.4–10.5)
Chloride: 104 mEq/L (ref 96–112)
Creatinine, Ser: 0.87 mg/dL (ref 0.40–1.20)
GFR: 82.57 mL/min (ref >60.00)
Glucose, Bld: 90 mg/dL (ref 70–99)
Potassium: 4.3 mEq/L (ref 3.5–5.1)
Sodium: 141 mEq/L (ref 135–145)
Total Bilirubin: 0.5 mg/dL (ref 0.2–1.2)
Total Protein: 7.4 g/dL (ref 6.0–8.3)

## 2016-06-27 LAB — CBC WITH DIFFERENTIAL/PLATELET
Basophils Absolute: 0 10*3/uL (ref 0.0–0.1)
Basophils Relative: 0.5 % (ref 0.0–3.0)
Eosinophils Absolute: 0 10*3/uL (ref 0.0–0.7)
Eosinophils Relative: 0.7 % (ref 0.0–5.0)
HCT: 38.3 % (ref 36.0–46.0)
Hemoglobin: 13 g/dL (ref 12.0–15.0)
Lymphocytes Relative: 43.9 % (ref 12.0–46.0)
Lymphs Abs: 2.5 10*3/uL (ref 0.7–4.0)
MCHC: 33.9 g/dL (ref 30.0–36.0)
MCV: 85 fl (ref 78.0–100.0)
Monocytes Absolute: 0.3 10*3/uL (ref 0.1–1.0)
Monocytes Relative: 5.6 % (ref 3.0–12.0)
Neutro Abs: 2.8 10*3/uL (ref 1.4–7.7)
Neutrophils Relative %: 49.3 % (ref 43.0–77.0)
Platelets: 204 10*3/uL (ref 150.0–400.0)
RBC: 4.5 Mil/uL (ref 3.87–5.11)
RDW: 14.2 % (ref 11.5–15.5)
WBC: 5.6 10*3/uL (ref 4.0–10.5)

## 2016-06-27 LAB — HEPATITIS C ANTIBODY: HCV Ab: NEGATIVE

## 2016-06-27 LAB — HEMOGLOBIN A1C: Hgb A1c MFr Bld: 5.8 % (ref 4.6–6.5)

## 2016-06-27 LAB — VITAMIN D 25 HYDROXY (VIT D DEFICIENCY, FRACTURES): VITD: 64.07 ng/mL (ref 30.00–100.00)

## 2016-06-27 NOTE — Progress Notes (Signed)
I reviewed health advisor's note, was available for consultation, and agree with documentation and plan.  

## 2016-06-27 NOTE — Progress Notes (Signed)
Subjective:   Samantha [name] is a 70 y.o. female who presents for Medicare Annual (Subsequent) preventive examination.  Review of Systems:  N/A Cardiac Risk Factors include: advanced age (>63men, >25 women);hypertension     Objective:     Vitals: BP 118/68 (BP Location: Left Arm, Patient Position: Sitting, Cuff Size: Normal)   Pulse 69   Temp 98 F (36.7 C) (Oral)   Ht 5' 1.5" (1.562 m)   Wt 138 lb 8 oz (62.8 kg)   SpO2 99%   BMI 25.75 kg/m   Body mass index is 25.75 kg/m.   Tobacco History  Smoking Status  . Never Smoker  Smokeless Tobacco  . Never Used     Counseling given: No   Past Medical History:  Diagnosis Date  . ALLERGIC RHINITIS 07/26/2008  . ASTHMA 04/14/2007  . ASYMPTOMATIC POSTMENOPAUSAL STATUS 05/10/2008  . CHEST PAIN 08/26/2008  . COLONIC POLYPS, HX OF 04/14/2007   colonic leiomyoma  . Episcleritis   . FIBROIDS, UTERUS 05/10/2008  . Fibromyalgia   . GERD 04/14/2007  . HYPERCHOLESTEROLEMIA 01/07/2008  . HYPERGLYCEMIA 11/28/2009  . HYPERTENSION 01/28/2008  . INSOMNIA 05/10/2008  . Irritable bowel syndrome 04/06/2010  . OSTEOARTHRITIS 11/28/2009  . OSTEOPOROSIS 04/14/2007  . 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 SINUSITIS 02/10/2009   Past Surgical History:  Procedure Laterality Date  . COLONOSCOPY W/ POLYPECTOMY    . ELECTROCARDIOGRAM  12/04/2006  . LAPAROSCOPY    . NASAL POLYP SURGERY     Polypectomy and septoplasty  . Neck Injury  1983  . Stress Cardiolite  02/13/2002   Family History  Problem Relation Age of Onset  . Heart disease Father     CHF  . Cancer Neg Hx     No FH of Colon Cancer   History  Sexual Activity  . Sexual activity: No    Outpatient Encounter Prescriptions as of 06/27/2016  Medication Sig  . albuterol (PROVENTIL HFA) 108 (90 Base) MCG/ACT inhaler Inhale 2 puffs into the lungs every 6 (six) hours as needed.  . Ascorbic Acid (VITAMIN C) 500 MG tablet Take 500 mg by mouth daily.    . bifidobacterium infantis (ALIGN) capsule  Take 1 capsule by mouth daily.    . cetirizine (ZYRTEC) 10 MG tablet Take 10 mg by mouth daily.  . Cholecalciferol (VITAMIN D3) 400 UNITS tablet Take 400 Units by mouth 2 (two) times daily with a meal. Patient is taking 50,000 IU for 12 weeks , started 01/10/2016  . Coenzyme Q-10 60 MG CAPS Take 1 capsule by mouth 2 (two) times daily.   . Cyanocobalamin (VITAMIN B-12) 2500 MCG SUBL Place 1 tablet under the tongue daily. Contains Folic Acid, B6, and Biotin  . EPINEPHrine 0.3 mg/0.3 mL IJ SOAJ injection Inject 0.3 mLs (0.3 mg total) into the muscle once.  . Ginger, Zingiber officinalis, (GINGER PO) Take by mouth.  Marland Kitchen guaiFENesin (MUCINEX) 600 MG 12 hr tablet Take by mouth 2 (two) times daily as needed. Reported on 03/05/2016  . losartan (COZAAR) 50 MG tablet Take 1 tablet (50 mg total) by mouth daily.  . Magnesium Malate POWD (1250mg  tabs) 1 tablet by mouth two times a day  . Manganese 50 MG TABS Take 50 mg by mouth.  . Misc Natural Products (TART CHERRY ADVANCED PO) Take by mouth.  . montelukast (SINGULAIR) 10 MG tablet 1 daily  . NONFORMULARY OR COMPOUNDED ITEM Allergy Vaccine 1:10 Given at [facility]  . Omega-3 Fatty Acids (OMEGA 3 PO) Take  one tablet daily  . thiamine 100 MG tablet Take 100 mg by mouth daily.    . Triamcinolone Acetonide (NASACORT ALLERGY 24HR NA) Place 1 spray into the nose.  . TURMERIC PO Take by mouth daily.  Marland Kitchen omeprazole (PRILOSEC) 40 MG capsule Take 1 capsule (40 mg total) by mouth daily.   No facility-administered encounter medications on file as of 06/27/2016.     Activities of Daily Living In your present state of health, do you have any difficulty performing the following activities: 06/27/2016  Hearing? Y  Vision? N  Difficulty concentrating or making decisions? Y  Walking or climbing stairs? Y  Dressing or bathing? N  Doing errands, shopping? N  Preparing Food and eating ? N  Using the Toilet? N  In the past six months, have you accidently leaked  urine? N  Do you have problems with loss of bowel control? N  Managing your Medications? N  Managing your Finances? N  Housekeeping or managing your Housekeeping? N  Some recent data might be hidden    Patient Care Team: Lucille Passy, MD as PCP - General (Family Medicine) Deneise Lever, MD (Pulmonary Disease) Ladene Artist, MD (Gastroenterology) Bo Merino, MD (Rheumatology) Arvella Nigh, MD as Consulting Physician (Obstetrics and Gynecology) Leandrew Koyanagi, MD as Referring Physician (Ophthalmology) Melissa Montane, MD as Consulting Physician (Otolaryngology) Melrose Nakayama, MD as Consulting Physician (Orthopedic Surgery) Druscilla Brownie, MD as Consulting Physician (Dermatology) Everitt Amber, MD as Consulting Physician (Obstetrics and Gynecology)    Assessment:     Hearing Screening   125Hz  250Hz  500Hz  1000Hz  2000Hz  3000Hz  4000Hz  6000Hz  8000Hz   Right ear:   40 40 40  40    Left ear:   40 40 40  40    Vision Screening Comments: Last vision exam on 06/04/16 with Dr. Wallace Going   Exercise Activities and Dietary recommendations Current Exercise Habits: Home exercise routine, Type of exercise: Other - see comments (physical therapy exercises), Time (Minutes): 30, Frequency (Times/Week): 2, Weekly Exercise (Minutes/Week): 60, Intensity: Mild, Exercise limited by: None identified  Goals    . Increase physical activity          Starting 06/27/2016, I will resume physical therapy exercises for at least 30 min twice weekly.       Fall Risk Fall Risk  06/27/2016 06/08/2015 06/02/2014 05/13/2013  Falls in the past year? No No No No   Depression Screen PHQ 2/9 Scores 06/27/2016 06/08/2015 06/02/2014 05/13/2013  PHQ - 2 Score 0 0 0 0     Cognitive Testing MMSE - Mini Mental State Exam 06/27/2016  Orientation to time 5  Orientation to Place 5  Registration 3  Attention/ Calculation 0  Recall 3  Language- name 2 objects 0  Language- repeat 1  Language- follow 3 step command 3    Language- read & follow direction 0  Write a sentence 0  Copy design 0  Total score 20   PLEASE NOTE: A Mini-Cog screen was completed. Maximum score is 20. A value of 0 denotes this part of Folstein MMSE was not completed or the patient failed this part of the Mini-Cog screening.   Mini-Cog Screening Orientation to Time - Max 5 pts Orientation to Place - Max 5 pts Registration - Max 3 pts Recall - Max 3 pts Language Repeat - Max 1 pts Language Follow 3 Step Command - Max 3 pts  Immunization History  Administered Date(s) Administered  . Influenza Split 06/04/2011, 05/27/2012  . Influenza Whole  08/18/2008, 05/26/2010  . Influenza,inj,Quad PF,36+ Mos 06/08/2013, 05/19/2014, 06/08/2015, 05/07/2016  . Pneumococcal Conjugate-13 03/17/2014  . Pneumococcal Polysaccharide-23 11/23/2009, 06/08/2015  . Td 11/09/2002  . Tdap 07/03/2013  . Zoster 06/17/2013   Screening Tests Health Maintenance  Topic Date Due  . MAMMOGRAM  10/30/2017  . COLONOSCOPY  04/21/2020  . TETANUS/TDAP  07/04/2023  . INFLUENZA VACCINE  Completed  . DEXA SCAN  Completed  . ZOSTAVAX  Completed  . Hepatitis C Screening  Completed  . PNA vac Low Risk Adult  Completed      Plan:     I have personally reviewed and addressed the Medicare Annual Wellness questionnaire and have noted the following in the patient's chart:  A. Medical and social history B. Use of alcohol, tobacco or illicit drugs  C. Current medications and supplements D. Functional ability and status E.  Nutritional status F.  Physical activity G. Advance directives H. List of other physicians I.  Hospitalizations, surgeries, and ER visits in previous 12 months J.  [place] to include hearing, vision, cognitive, depression L. Referrals and appointments - none  In addition, I have reviewed and discussed with patient certain preventive protocols, quality metrics, and best practice recommendations. A written personalized care plan  for preventive services as well as general preventive health recommendations were provided to patient.  See attached scanned questionnaire for additional information.   Signed,   Lindell Noe, MHA, BS, LPN Health Coach

## 2016-06-27 NOTE — Progress Notes (Signed)
Pre visit review using our clinic review tool, if applicable. No additional management support is needed unless otherwise documented below in the visit note. 

## 2016-06-27 NOTE — Telephone Encounter (Signed)
Left a message with patient to cancel appt with Dr. Fuller Plan. Patient was last seen with Dr. Earlean Shawl on 04/20/16 and was scheduled for Colonoscopy that she cancelled.

## 2016-06-27 NOTE — Progress Notes (Signed)
PCP notes:   Health maintenance:  Hep C screening - completed  Abnormal screenings:   None  Patient concerns:   Pt had complaint of stinging when urinating. PCP notified. UA completed. Results entered and routed to PCP.   Nurse concerns:  None  Next PCP appt:   07/04/16 @ 0915

## 2016-06-27 NOTE — Patient Instructions (Signed)
Samantha [name] , Thank you for taking time to come for your Medicare Wellness Visit. I appreciate your ongoing commitment to your health goals. Please review the following plan we discussed and let me know if I can assist you in the future.   These are the goals we discussed: Goals    . Increase physical activity          Starting 06/27/2016, I will resume physical therapy exercises for at least 30 min twice weekly.        This is a list of the screening recommended for you and due dates:  Health Maintenance  Topic Date Due  . Mammogram  10/30/2017  . Colon Cancer Screening  04/21/2020  . Tetanus Vaccine  07/04/2023  . Flu Shot  Completed  . DEXA scan (bone density measurement)  Completed  . Shingles Vaccine  Completed  .  Hepatitis C: One time screening is recommended by Center for Disease Control  (CDC) for  adults born from [birth year] through 1966.   Completed  . Pneumonia vaccines  Completed   Preventive Care for Adults  A healthy lifestyle and preventive care can promote health and wellness. Preventive health guidelines for adults include the following key practices.  . A routine yearly physical is a good way to check with your health care provider about your health and preventive screening. It is a chance to share any concerns and updates on your health and to receive a thorough exam.  . Visit your dentist for a routine exam and preventive care every 6 months. Brush your teeth twice a day and floss once a day. Good oral hygiene prevents tooth decay and gum disease.  . The frequency of eye exams is based on your age, health, family medical history, use  of contact lenses, and other factors. Follow your health care provider's ecommendations for frequency of eye exams.  . Eat a healthy diet. Foods like vegetables, fruits, whole grains, low-fat dairy products, and lean protein foods contain the nutrients you need without too many calories. Decrease your intake of foods high in solid fats,  added sugars, and salt. Eat the right amount of calories for you. Get information about a proper diet from your health care provider, if necessary.  . Regular physical exercise is one of the most important things you can do for your health. Most adults should get at least 150 minutes of moderate-intensity exercise (any activity that increases your heart rate and causes you to sweat) each week. In addition, most adults need muscle-strengthening exercises on 2 or more days a week.  Silver Sneakers may be a benefit available to you. To determine eligibility, you may visit the website: www.silversneakers.com or contact program at (610)780-4329 Mon-Fri between 8AM-8PM.   . Maintain a healthy weight. The body mass index (BMI) is a screening tool to identify possible weight problems. It provides an estimate of body fat based on height and weight. Your health care provider can find your BMI and can help you achieve or maintain a healthy weight.   For adults 20 years and older: ? A BMI below 18.5 is considered underweight. ? A BMI of 18.5 to 24.9 is normal. ? A BMI of 25 to 29.9 is considered overweight. ? A BMI of 30 and above is considered obese.   . Maintain normal blood lipids and cholesterol levels by exercising and minimizing your intake of saturated fat. Eat a balanced diet with plenty of fruit and vegetables. Blood tests for lipids and  cholesterol should begin at age 7 and be repeated every 5 years. If your lipid or cholesterol levels are high, you are over 50, or you are at high risk for heart disease, you may need your cholesterol levels checked more frequently. Ongoing high lipid and cholesterol levels should be treated with medicines if diet and exercise are not working.  . If you smoke, find out from your health care provider how to quit. If you do not use tobacco, please do not start.  . If you choose to drink alcohol, please do not consume more than 2 drinks per day. One drink is  considered to be 12 ounces (355 mL) of beer, 5 ounces (148 mL) of wine, or 1.5 ounces (44 mL) of liquor.  . If you are 69-87 years old, ask your health care provider if you should take aspirin to prevent strokes.  . Use sunscreen. Apply sunscreen liberally and repeatedly throughout the day. You should seek shade when your shadow is shorter than you. Protect yourself by wearing long sleeves, pants, a wide-brimmed hat, and sunglasses year round, whenever you are outdoors.  . Once a month, do a whole body skin exam, using a mirror to look at the skin on your back. Tell your health care provider of new moles, moles that have irregular borders, moles that are larger than a pencil eraser, or moles that have changed in shape or color.

## 2016-07-02 ENCOUNTER — Other Ambulatory Visit: Payer: Self-pay

## 2016-07-02 NOTE — Telephone Encounter (Signed)
Patient cancelled appt with Dr. Fuller Plan.

## 2016-07-04 ENCOUNTER — Ambulatory Visit (INDEPENDENT_AMBULATORY_CARE_PROVIDER_SITE_OTHER): Payer: Medicare Other | Admitting: *Deleted

## 2016-07-04 ENCOUNTER — Encounter: Payer: Self-pay | Admitting: Family Medicine

## 2016-07-04 ENCOUNTER — Ambulatory Visit (INDEPENDENT_AMBULATORY_CARE_PROVIDER_SITE_OTHER): Payer: Medicare Other | Admitting: Family Medicine

## 2016-07-04 VITALS — BP 124/62 | HR 69 | Temp 98.0°F | Ht 62.0 in | Wt 139.2 lb

## 2016-07-04 DIAGNOSIS — M797 Fibromyalgia: Secondary | ICD-10-CM

## 2016-07-04 DIAGNOSIS — J301 Allergic rhinitis due to pollen: Secondary | ICD-10-CM

## 2016-07-04 DIAGNOSIS — I10 Essential (primary) hypertension: Secondary | ICD-10-CM | POA: Diagnosis not present

## 2016-07-04 DIAGNOSIS — S32040B Wedge compression fracture of fourth lumbar vertebra, initial encounter for open fracture: Secondary | ICD-10-CM

## 2016-07-04 DIAGNOSIS — Z01419 Encounter for gynecological examination (general) (routine) without abnormal findings: Secondary | ICD-10-CM | POA: Diagnosis not present

## 2016-07-04 DIAGNOSIS — E7800 Pure hypercholesterolemia, unspecified: Secondary | ICD-10-CM | POA: Diagnosis not present

## 2016-07-04 DIAGNOSIS — J309 Allergic rhinitis, unspecified: Secondary | ICD-10-CM

## 2016-07-04 DIAGNOSIS — M8000XS Age-related osteoporosis with current pathological fracture, unspecified site, sequela: Secondary | ICD-10-CM | POA: Diagnosis not present

## 2016-07-04 NOTE — Assessment & Plan Note (Signed)
Well controlled.  No changes made. 

## 2016-07-04 NOTE — Assessment & Plan Note (Signed)
Good control. No changes made. 

## 2016-07-04 NOTE — Assessment & Plan Note (Signed)
Followed by rheumatology. 

## 2016-07-04 NOTE — Patient Instructions (Signed)
Great to see you. Your lab work looks great! 

## 2016-07-04 NOTE — Progress Notes (Addendum)
Subjective:    Patient ID: Samantha [name], female    DOB: [birth year], 70 y.o.   MRN: [id number]  HPI Very pleasant 70 yo female with h/o HTN, allergic rhinitis, GERD, asthma, fibromyalgia, here for CPX and follow up of chronic medical conditions.  Saw Candis Musa, RN for annual medicare wellness visit on 06/27/16.  Note reviewed.  Mammogram 10/31/15 Colonoscopy 04/21/10 prevnar 13 03/16/14 Pneumovax 05/19/15 Tdap 07/03/13 Zoster 06/17/13  Asthma - sees Dr. Annamaria Boots.  Las saw him on 04/03/16- note reviewed. Also receiving allergy shots.  Fibromyalgia- followed by Dr. Estanislado Pandy.     HTN- now taking Cozaar 50 mg daily. Denies HA, blurred vision, CP or SOB.  No LE edema. Lab Results  Component Value Date   CREATININE 0.87 06/27/2016   Lab Results  Component Value Date   CHOL 215 (H) 06/27/2016   HDL 97.40 06/27/2016   LDLCALC 104 (H) 06/27/2016   TRIG 71.0 06/27/2016   CHOLHDL 2 06/27/2016   Lab Results  Component Value Date   WBC 5.6 06/27/2016   HGB 13.0 06/27/2016   HCT 38.3 06/27/2016   MCV 85.0 06/27/2016   PLT 204.0 06/27/2016   Lab Results  Component Value Date   TSH 1.90 06/27/2016    Patient Active Problem List   Diagnosis Date Noted  . Well woman exam 07/04/2016  . Diverticulosis 03/19/2016  . Lumbar compression fracture ([place]) 06/20/2015  . Increased pressure in the eye 06/02/2014  . Fibromyalgia 02/26/2013  . Allergic rhinitis due to pollen 11/20/2010  . 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 IRRITABLE BOWEL SYNDROME 04/06/2010  . THYROID NODULE, RIGHT 12/07/2009  . OSTEOARTHRITIS 11/28/2009  . FIBROIDS, UTERUS 05/10/2008  . Insomnia 05/10/2008  . ASYMPTOMATIC POSTMENOPAUSAL STATUS 05/10/2008  . Essential hypertension 01/28/2008  . HYPERCHOLESTEROLEMIA 01/07/2008  . GLAUCOMA 01/07/2008  . 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 GERD 04/14/2007  . Osteoporosis 04/14/2007  . History of colonic polyps 04/14/2007   Past Medical History:  Diagnosis Date   . ALLERGIC RHINITIS 07/26/2008  . ASTHMA 04/14/2007  . ASYMPTOMATIC POSTMENOPAUSAL STATUS 05/10/2008  . CHEST PAIN 08/26/2008  . COLONIC POLYPS, HX OF 04/14/2007   colonic leiomyoma  . Episcleritis   . FIBROIDS, UTERUS 05/10/2008  . Fibromyalgia   . GERD 04/14/2007  . HYPERCHOLESTEROLEMIA 01/07/2008  . HYPERGLYCEMIA 11/28/2009  . HYPERTENSION 01/28/2008  . INSOMNIA 05/10/2008  . Irritable bowel syndrome 04/06/2010  . OSTEOARTHRITIS 11/28/2009  . OSTEOPOROSIS 04/14/2007  . 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 SINUSITIS 02/10/2009   Past Surgical History:  Procedure Laterality Date  . COLONOSCOPY W/ POLYPECTOMY    . ELECTROCARDIOGRAM  12/04/2006  . LAPAROSCOPY    . NASAL POLYP SURGERY     Polypectomy and septoplasty  . Neck Injury  1983  . Stress Cardiolite  02/13/2002   Social History  Substance Use Topics  . Smoking status: Never Smoker  . Smokeless tobacco: Never Used  . Alcohol use No   Family History  Problem Relation Age of Onset  . Heart disease Father     CHF  . Cancer Neg Hx     No FH of Colon Cancer   Allergies  Allergen Reactions  . Augmentin [Amoxicillin-Pot Clavulanate] Diarrhea  . Cefuroxime Axetil     REACTION: pt not sure  . Sulfonamide Derivatives     REACTION: pt not sure   Current Outpatient Prescriptions on File Prior to Visit  Medication Sig Dispense Refill  . albuterol (PROVENTIL HFA) 108 (90 Base) MCG/ACT inhaler Inhale 2 puffs  into the lungs every 6 (six) hours as needed. 3 Inhaler 3  . Ascorbic Acid (VITAMIN C) 500 MG tablet Take 500 mg by mouth daily.      . bifidobacterium infantis (ALIGN) capsule Take 1 capsule by mouth daily.      . cetirizine (ZYRTEC) 10 MG tablet Take 10 mg by mouth daily.    . Cholecalciferol (VITAMIN D3) 400 UNITS tablet Take 400 Units by mouth 2 (two) times daily with a meal.     . Coenzyme Q-10 60 MG CAPS Take 1 capsule by mouth 2 (two) times daily.     . Cyanocobalamin (VITAMIN B-12) 1000 MCG SUBL Place 1 tablet under the tongue  daily. Contains Folic Acid, B6, and Biotin    . EPINEPHrine 0.3 mg/0.3 mL IJ SOAJ injection Inject 0.3 mLs (0.3 mg total) into the muscle once. 1 Device 1  . Ginger, Zingiber officinalis, (GINGER PO) Take by mouth.    Marland Kitchen guaiFENesin (MUCINEX) 600 MG 12 hr tablet Take by mouth 2 (two) times daily as needed. Reported on 03/05/2016    . losartan (COZAAR) 50 MG tablet Take 1 tablet (50 mg total) by mouth daily. 15 tablet 0  . Magnesium Malate POWD (1250mg  tabs) 1 tablet by mouth two times a day    . Manganese 50 MG TABS Take 50 mg by mouth.    . Misc Natural Products (TART CHERRY ADVANCED PO) Take by mouth.    . montelukast (SINGULAIR) 10 MG tablet 1 daily 90 tablet 3  . NONFORMULARY OR COMPOUNDED ITEM Allergy Vaccine 1:10 Given at [facility]    . Omega-3 Fatty Acids (OMEGA 3 PO) Take one tablet daily    . thiamine 100 MG tablet Take 100 mg by mouth daily.      . Triamcinolone Acetonide (NASACORT ALLERGY 24HR NA) Place 1 spray into the nose.    . TURMERIC PO Take by mouth daily.    Marland Kitchen omeprazole (PRILOSEC) 40 MG capsule Take 1 capsule (40 mg total) by mouth daily. 90 capsule 3   No current facility-administered medications on file prior to visit.    The PMH, PSH, Social History, Family History, Medications, and allergies have been reviewed in [facility], and have been updated if relevant.       Review of Systems  Constitutional: Negative for chills and fever.  HENT: Negative for congestion, postnasal drip, rhinorrhea, sinus pressure, sneezing, trouble swallowing and voice change.   Eyes: Negative.   Respiratory: Negative for cough, shortness of breath, wheezing and stridor.   Cardiovascular: Negative.   Gastrointestinal: Negative.   Endocrine: Negative.   Genitourinary: Negative.   Musculoskeletal: Negative.   Skin: Negative.   Allergic/Immunologic: Negative.   Neurological: Negative.   Hematological: Negative.   Psychiatric/Behavioral: Negative.   All other systems reviewed and are  negative.   Objective:   Physical Exam  BP 124/62   Pulse 69   Temp 98 F (36.7 C) (Oral)   Ht 5\' 2"  (1.575 m)   Wt 139 lb 4 oz (63.2 kg)   SpO2 98%   BMI 25.47 kg/m    General:  Well-developed,well-nourished,in no acute distress; alert,appropriate and cooperative throughout examination Head:  normocephalic and atraumatic.   Eyes:  vision grossly intact, pupils equal, pupils round, and pupils reactive to light.   Ears:  R ear normal and L ear normal.   Nose:  no external deformity.   Mouth:  good dentition.   Neck:  No deformities, masses, or tenderness noted. Breasts:  No mass, nodules, thickening, tenderness, bulging, retraction, inflamation, nipple discharge or skin changes noted.   Lungs:  Normal respiratory effort, chest expands symmetrically. Lungs are clear to auscultation, no crackles or wheezes. Heart:  Normal rate and regular rhythm. S1 and S2 normal without gallop, murmur, click, rub or other extra sounds. Abdomen:  Bowel sounds positive,abdomen soft and non-tender without masses, organomegaly or hernias noted. Rectal:  no external abnormalities.   Genitalia:  Pelvic Exam:        External: normal female genitalia without lesions or masses        Vagina: normal without lesions or masses        Cervix: normal without lesions or masses        Adnexa: normal bimanual exam without masses or fullness        Uterus: absent     Msk:  No deformity or scoliosis noted of thoracic or lumbar spine.   Extremities:  No clubbing, cyanosis, edema, or deformity noted with normal full range of motion of all joints.   Neurologic:  alert & oriented X3 and gait normal.   Skin:  Intact without suspicious lesions or rashes Cervical Nodes:  No lymphadenopathy noted Axillary Nodes:  No palpable lymphadenopathy Psych:  Cognition and judgment appear intact. Alert and cooperative with normal attention span and concentration. No apparent delusions, illusions, hallucinations      Assessment  & Plan:

## 2016-07-04 NOTE — Assessment & Plan Note (Signed)
Receiving allergy shots. Continue current rxs.

## 2016-07-04 NOTE — Assessment & Plan Note (Signed)
Reviewed preventive care protocols, scheduled due services, and updated immunizations Discussed nutrition, exercise, diet, and healthy lifestyle.  

## 2016-07-05 ENCOUNTER — Ambulatory Visit: Payer: Medicare Other | Admitting: Gastroenterology

## 2016-07-11 ENCOUNTER — Ambulatory Visit (INDEPENDENT_AMBULATORY_CARE_PROVIDER_SITE_OTHER): Payer: Medicare Other | Admitting: *Deleted

## 2016-07-11 DIAGNOSIS — J309 Allergic rhinitis, unspecified: Secondary | ICD-10-CM | POA: Diagnosis not present

## 2016-07-25 ENCOUNTER — Ambulatory Visit (INDEPENDENT_AMBULATORY_CARE_PROVIDER_SITE_OTHER): Payer: Medicare Other | Admitting: *Deleted

## 2016-07-25 DIAGNOSIS — J309 Allergic rhinitis, unspecified: Secondary | ICD-10-CM | POA: Diagnosis not present

## 2016-07-27 ENCOUNTER — Other Ambulatory Visit: Payer: Self-pay

## 2016-07-27 DIAGNOSIS — I959 Hypotension, unspecified: Secondary | ICD-10-CM

## 2016-07-27 MED ORDER — LOSARTAN POTASSIUM 50 MG PO TABS: 50.0000 mg | ORAL_TABLET | Freq: Every day | ORAL | 3 refills | Status: DC

## 2016-07-27 MED ORDER — OMEPRAZOLE 40 MG PO CPDR: 40.0000 mg | DELAYED_RELEASE_CAPSULE | Freq: Every day | ORAL | 3 refills | Status: DC

## 2016-07-27 NOTE — Telephone Encounter (Signed)
Pt left v/m requesting refill omeprazole and losartan to meds by mail [facility]. Last annual 07/04/16.advised pt done and pt voiced understanding.

## 2016-07-30 ENCOUNTER — Ambulatory Visit (INDEPENDENT_AMBULATORY_CARE_PROVIDER_SITE_OTHER): Payer: Medicare Other

## 2016-07-30 ENCOUNTER — Ambulatory Visit: Payer: Medicare Other

## 2016-07-30 DIAGNOSIS — J309 Allergic rhinitis, unspecified: Secondary | ICD-10-CM

## 2016-08-08 ENCOUNTER — Ambulatory Visit (INDEPENDENT_AMBULATORY_CARE_PROVIDER_SITE_OTHER): Payer: Medicare Other | Admitting: *Deleted

## 2016-08-08 DIAGNOSIS — J309 Allergic rhinitis, unspecified: Secondary | ICD-10-CM | POA: Diagnosis not present

## 2016-08-14 ENCOUNTER — Ambulatory Visit: Payer: Medicare Other

## 2016-08-15 ENCOUNTER — Ambulatory Visit (INDEPENDENT_AMBULATORY_CARE_PROVIDER_SITE_OTHER): Payer: Medicare Other | Admitting: *Deleted

## 2016-08-15 DIAGNOSIS — J309 Allergic rhinitis, unspecified: Secondary | ICD-10-CM

## 2016-08-20 DIAGNOSIS — J069 Acute upper respiratory infection, unspecified: Secondary | ICD-10-CM | POA: Diagnosis not present

## 2016-08-23 ENCOUNTER — Ambulatory Visit (INDEPENDENT_AMBULATORY_CARE_PROVIDER_SITE_OTHER): Payer: Medicare Other | Admitting: Family Medicine

## 2016-08-23 ENCOUNTER — Encounter: Payer: Self-pay | Admitting: Family Medicine

## 2016-08-23 DIAGNOSIS — J069 Acute upper respiratory infection, unspecified: Secondary | ICD-10-CM

## 2016-08-23 HISTORY — DX: Acute upper respiratory infection, unspecified: J06.9

## 2016-08-23 MED ORDER — AMOXICILLIN-POT CLAVULANATE 875-125 MG PO TABS: 1.0000 | ORAL_TABLET | Freq: Two times a day (BID) | ORAL | 0 refills | Status: AC

## 2016-08-23 NOTE — Assessment & Plan Note (Signed)
Given duration and progression of symptoms, will treat for bacterial sinusitis with Augmentin.  Was on patient's intolerance list but removed because she tells me her reaction was diarrhea and she has had it since without issue. Call or return to clinic prn if these symptoms worsen or fail to improve as anticipated. The patient indicates understanding of these issues and agrees with the plan.

## 2016-08-23 NOTE — Progress Notes (Signed)
Subjective:   Patient ID: Samantha [name], female    DOB: 11/22/1945, 70 y.o.   MRN: [id number]  Samantha [name] is a pleasant 70 y.o. year old female who presents to clinic today with Follow-up (URI)  on 08/23/2016  HPI:  Samantha [name] to urgent care on 08/20/16 for cough, congestion. Per pt, was told it was viral and given tessalon persles and advised supportive care.  Since then, symptoms getting worse.  "now it's going to my chest."  Has chills and sinus pressure.  Cough is now productive of yellow phlegm.  Mucinex has been helping a little.     Current Outpatient Prescriptions on File Prior to Visit  Medication Sig Dispense Refill  . albuterol (PROVENTIL HFA) 108 (90 Base) MCG/ACT inhaler Inhale 2 puffs into the lungs every 6 (six) hours as needed. 3 Inhaler 3  . Ascorbic Acid (VITAMIN C) 500 MG tablet Take 500 mg by mouth daily.      . bifidobacterium infantis (ALIGN) capsule Take 1 capsule by mouth daily.      . cetirizine (ZYRTEC) 10 MG tablet Take 10 mg by mouth daily.    . Cholecalciferol (VITAMIN D3) 400 UNITS tablet Take 400 Units by mouth 2 (two) times daily with a meal.     . Coenzyme Q-10 60 MG CAPS Take 1 capsule by mouth 2 (two) times daily.     . Cyanocobalamin (VITAMIN B-12) 1000 MCG SUBL Place 1 tablet under the tongue daily. Contains Folic Acid, B6, and Biotin    . EPINEPHrine 0.3 mg/0.3 mL IJ SOAJ injection Inject 0.3 mLs (0.3 mg total) into the muscle once. 1 Device 1  . Ginger, Zingiber officinalis, (GINGER PO) Take by mouth.    Marland Kitchen guaiFENesin (MUCINEX) 600 MG 12 hr tablet Take by mouth 2 (two) times daily as needed. Reported on 03/05/2016    . losartan (COZAAR) 50 MG tablet Take 1 tablet (50 mg total) by mouth daily. 90 tablet 3  . Magnesium Malate POWD (1250mg  tabs) 1 tablet by mouth two times a day    . Manganese 50 MG TABS Take 50 mg by mouth.    . Misc Natural Products (TART CHERRY ADVANCED PO) Take by mouth.    . montelukast (SINGULAIR) 10 MG tablet 1 daily 90  tablet 3  . NONFORMULARY OR COMPOUNDED ITEM Allergy Vaccine 1:10 Given at [facility]    . Omega-3 Fatty Acids (OMEGA 3 PO) Take one tablet daily    . omeprazole (PRILOSEC) 40 MG capsule Take 1 capsule (40 mg total) by mouth daily. 90 capsule 3  . thiamine 100 MG tablet Take 100 mg by mouth daily.      . Triamcinolone Acetonide (NASACORT ALLERGY 24HR NA) Place 1 spray into the nose.    . TURMERIC PO Take by mouth daily.     No current facility-administered medications on file prior to visit.     Allergies  Allergen Reactions  . Cefuroxime Axetil     REACTION: pt not sure  . Sulfonamide Derivatives     REACTION: pt not sure    Past Medical History:  Diagnosis Date  . ALLERGIC RHINITIS 07/26/2008  . ASTHMA 04/14/2007  . ASYMPTOMATIC POSTMENOPAUSAL STATUS 05/10/2008  . CHEST PAIN 08/26/2008  . COLONIC POLYPS, HX OF 04/14/2007   colonic leiomyoma  . Episcleritis   . FIBROIDS, UTERUS 05/10/2008  . Fibromyalgia   . GERD 04/14/2007  . HYPERCHOLESTEROLEMIA 01/07/2008  . HYPERGLYCEMIA 11/28/2009  . HYPERTENSION 01/28/2008  . INSOMNIA  05/10/2008  . Irritable bowel syndrome 04/06/2010  . OSTEOARTHRITIS 11/28/2009  . OSTEOPOROSIS 04/14/2007  . 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 SINUSITIS 02/10/2009    Past Surgical History:  Procedure Laterality Date  . COLONOSCOPY W/ POLYPECTOMY    . ELECTROCARDIOGRAM  12/04/2006  . LAPAROSCOPY    . NASAL POLYP SURGERY     Polypectomy and septoplasty  . Neck Injury  1983  . Stress Cardiolite  02/13/2002    Family History  Problem Relation Age of Onset  . Heart disease Father     CHF  . Cancer Neg Hx     No FH of Colon Cancer    Social History   Social History  . Marital status: Widowed    Spouse name: N/A  . Number of children: 3  . Years of education: N/A   Occupational History  .  Retired    Retired Stage manager)   Social History Main Topics  . Smoking status: Never Smoker  . Smokeless tobacco: Never Used  . Alcohol use No  .  Drug use: No  . Sexual activity: No   Other Topics Concern  . Not on file   Social History Narrative   Lives alone (widowed).   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      Does not have living will.  Does desire CPR.  Does not want life support for prolonged periods of time if futile.  The PMH, PSH, Social History, Family History, Medications, and allergies have been reviewed in [facility], and have been updated if relevant.   Review of Systems  Constitutional: Positive for chills. Negative for fever.  HENT: Positive for congestion, sinus pain, sinus pressure and sore throat. Negative for ear pain.   Respiratory: Positive for cough. Negative for shortness of breath and wheezing.   Cardiovascular: Negative.   Allergic/Immunologic: Positive for environmental allergies.  All other systems reviewed and are negative.      Objective:    BP (!) 142/68   Pulse 68   Temp 98.2 F (36.8 C) (Oral)   Wt 142 lb 12 oz (64.8 kg)   SpO2 98%   BMI 26.11 kg/m    Physical Exam  Constitutional: She is oriented to person, place, and time. She appears well-developed and well-nourished. No distress.  HENT:  Head: Normocephalic.  Right Ear: Hearing and tympanic membrane normal.  Left Ear: Hearing and tympanic membrane normal.  Nose: Rhinorrhea present. Right sinus exhibits frontal sinus tenderness. Right sinus exhibits no maxillary sinus tenderness. Left sinus exhibits maxillary sinus tenderness and frontal sinus tenderness.  Mouth/Throat: Posterior oropharyngeal erythema present. No oropharyngeal exudate.  Cardiovascular: Normal rate.   Pulmonary/Chest: Effort normal and breath sounds normal. No respiratory distress.  Musculoskeletal: Normal range of motion.  Neurological: She is alert and oriented to person, place, and time. No cranial nerve deficit.  Skin: Skin is warm and dry. She is not diaphoretic.  Psychiatric: She has a normal mood and affect. Her behavior is normal. Judgment and  thought content normal.  Nursing note and vitals reviewed.         Assessment & Plan:   Acute upper respiratory infection No Follow-up on file.

## 2016-08-23 NOTE — Patient Instructions (Signed)
Great to see you. Happy Holidays.  Please take Augmentin as directed- 1 table twice daily for 10 days. Drink plenty of water. Ok to continue tessalon and mucinex as needed.

## 2016-08-23 NOTE — Progress Notes (Signed)
Pre visit review using our clinic review tool, if applicable. No additional management support is needed unless otherwise documented below in the visit note. 

## 2016-08-27 DIAGNOSIS — N83201 Unspecified ovarian cyst, right side: Secondary | ICD-10-CM | POA: Diagnosis not present

## 2016-09-12 ENCOUNTER — Ambulatory Visit
Admission: RE | Admit: 2016-09-12 | Discharge: 2016-09-12 | Disposition: A | Discharge status: Home / Self Care | Payer: Medicare Other | Source: Ambulatory Visit | Attending: Gastroenterology | Admitting: Gastroenterology

## 2016-09-12 ENCOUNTER — Other Ambulatory Visit: Payer: Self-pay | Admitting: Gastroenterology

## 2016-09-12 DIAGNOSIS — K222 Esophageal obstruction: Secondary | ICD-10-CM | POA: Diagnosis not present

## 2016-09-12 DIAGNOSIS — R10813 Right lower quadrant abdominal tenderness: Secondary | ICD-10-CM

## 2016-09-12 DIAGNOSIS — R10814 Left lower quadrant abdominal tenderness: Secondary | ICD-10-CM

## 2016-09-12 DIAGNOSIS — K219 Gastro-esophageal reflux disease without esophagitis: Secondary | ICD-10-CM | POA: Diagnosis not present

## 2016-09-12 DIAGNOSIS — R1011 Right upper quadrant pain: Secondary | ICD-10-CM | POA: Diagnosis not present

## 2016-09-12 MED ORDER — IOPAMIDOL (ISOVUE-300) INJECTION 61%: 100.0000 mL | Freq: Once | INTRAVENOUS | Status: DC | PRN

## 2016-09-30 IMAGING — RF DG ESOPHAGUS
7 of 8 series · 14 of 16 positions shown · non-contrast
Comparison: None.

CLINICAL DATA: Difficulty with food sticking, trouble swallowing
liquids and solids, chest pressure and burning.

EXAM:
ESOPHOGRAM / BARIUM SWALLOW / BARIUM TABLET STUDY
TECHNIQUE: Combined double contrast and single contrast examination performed
using effervescent crystals, thick barium liquid, and thin barium
liquid. The patient was observed with fluoroscopy swallowing a 13 mm
barium sulphate tablet.
FLUOROSCOPY TIME:  Radiation Exposure Index (as provided by the
fluoroscopic device):
If the device does not provide the exposure index:
Fluoroscopy Time:  1 minutes 6 seconds.
Number of Acquired Images:  None.

[Series 1: cp_standard · 0.36mm/px · 3 of 11 frames shown (1 of 7)]
[frame 2/11]
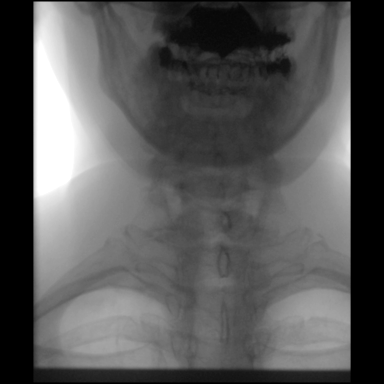
[frame 6/11]
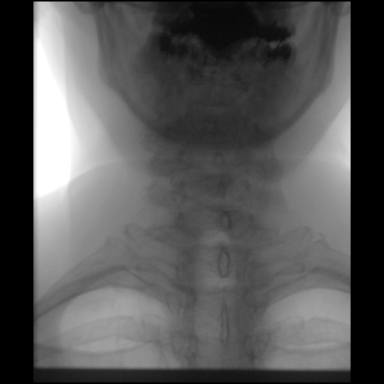
[frame 10/11]
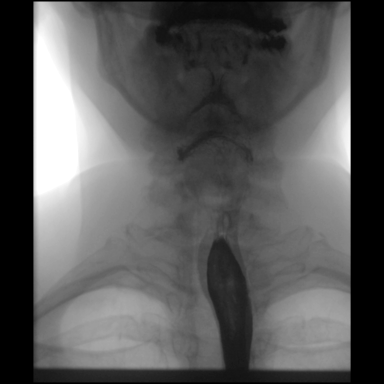

[Series 2: cp_standard · 0.18mm/px · 1 of 1 slices shown (2 of 7)]
[im 1/1]
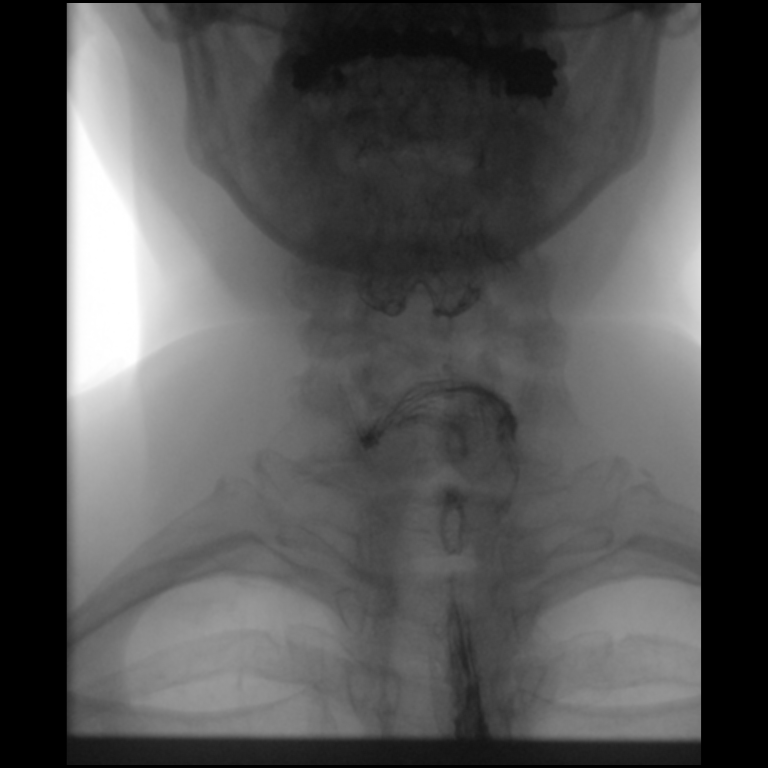

[Series 3: cp_standard · 0.36mm/px · 4 of 9 frames shown (3 of 7)]
[frame 1/9]
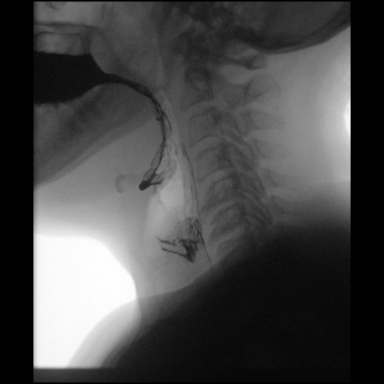
[frame 2/9]
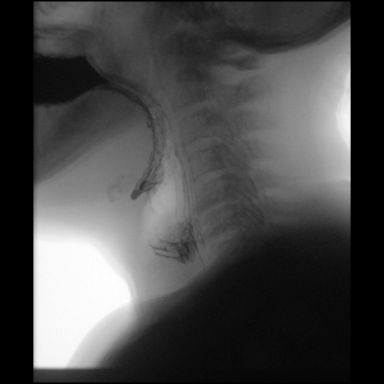
[frame 5/9]
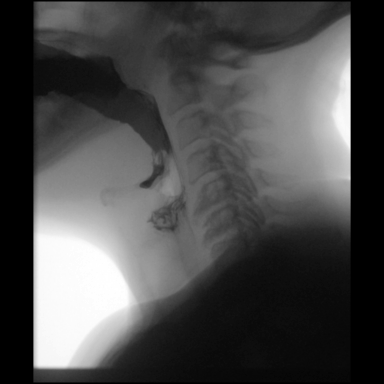
[frame 8/9]
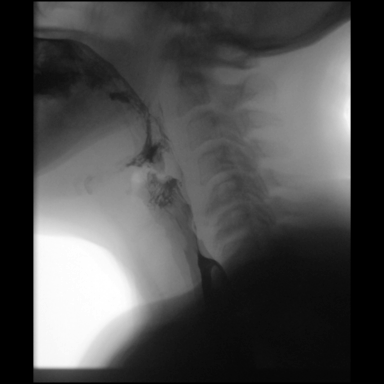

[Series 4: cp_standard · 0.18mm/px · 1 of 1 slices shown (4 of 7)]
[im 1/1]
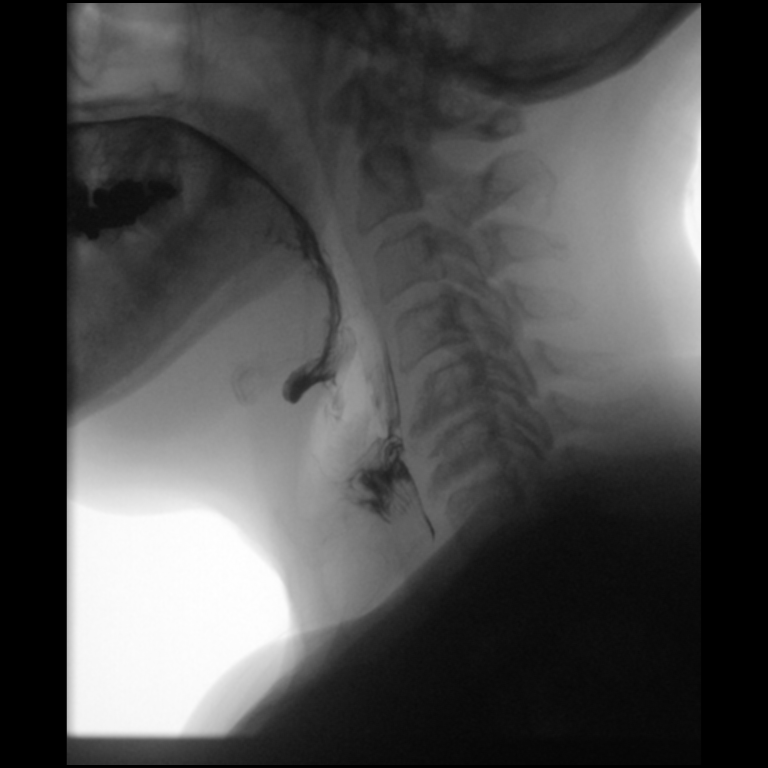

[Series 5: cp_standard · 0.27mm/px · 1 of 1 slices shown (5 of 7)]
[im 1/1]
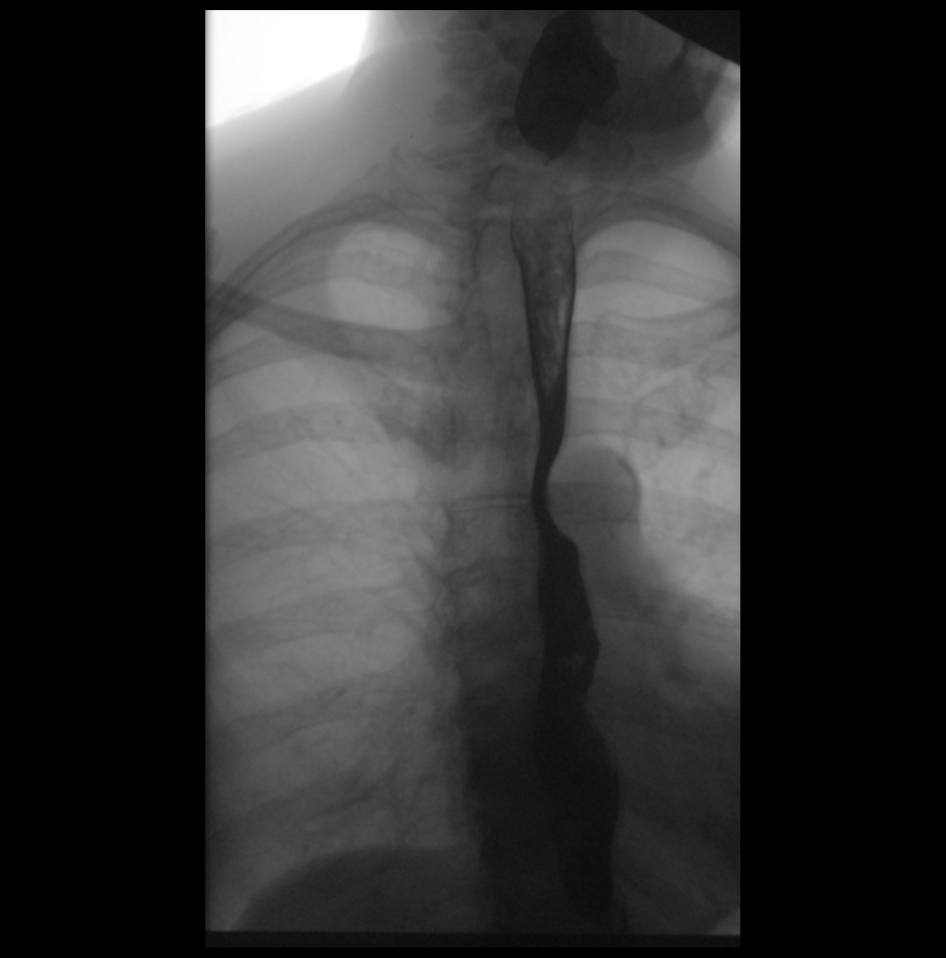

[Series 7: cp_standard · 0.31mm/px · 1 of 1 slices shown (6 of 7)]
[im 1/1]
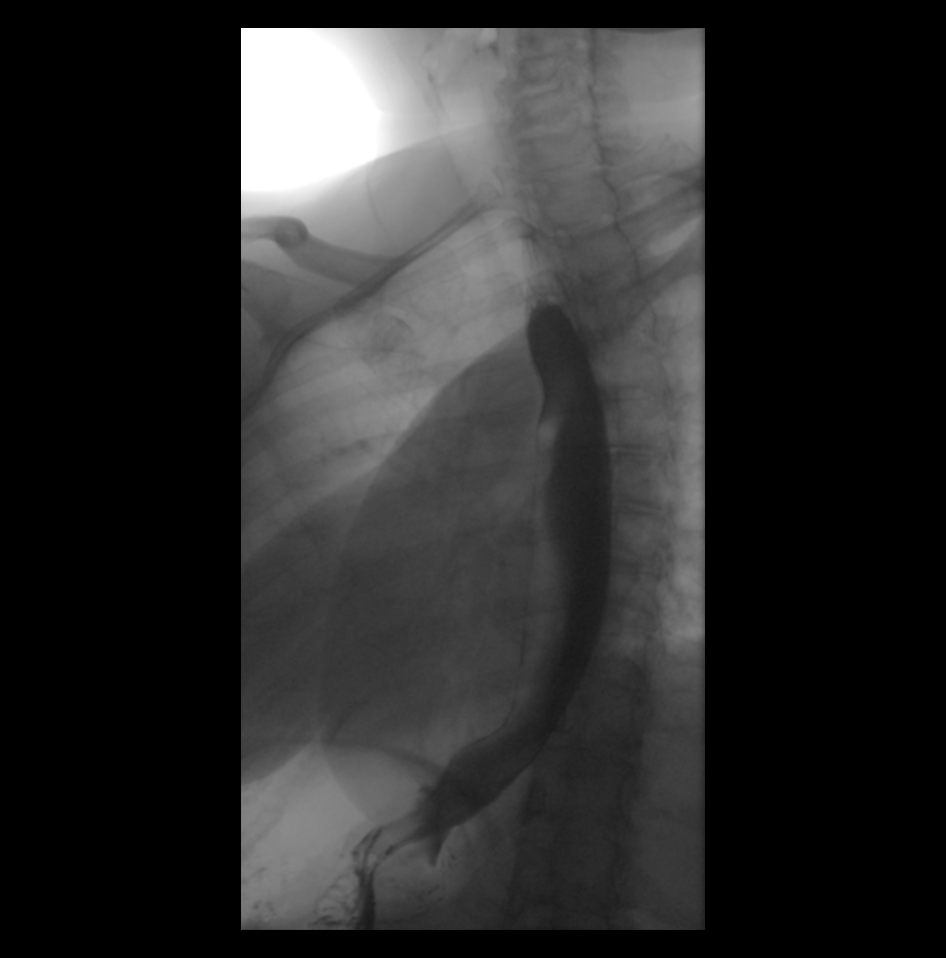

[Series 8: cp_standard · 0.62mm/px · 3 of 6 frames shown (7 of 7)]
[frame 1/6]
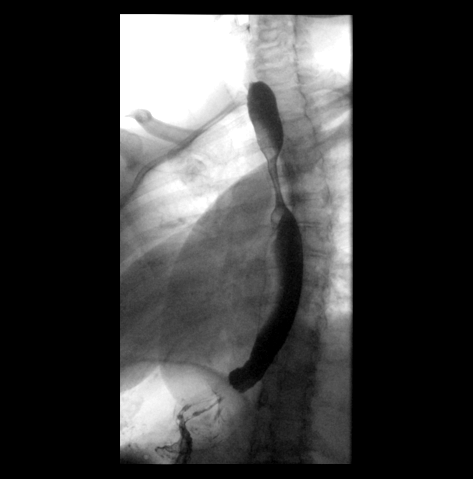
[frame 4/6]
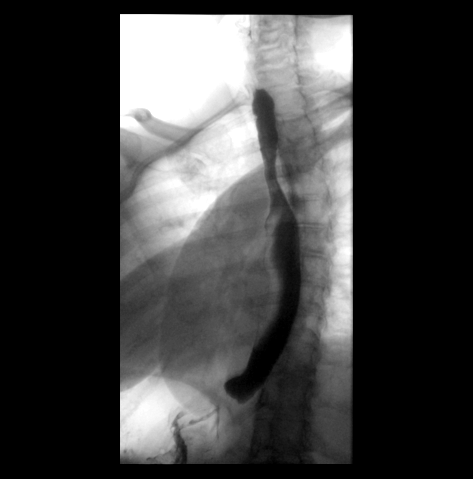
[frame 6/6]
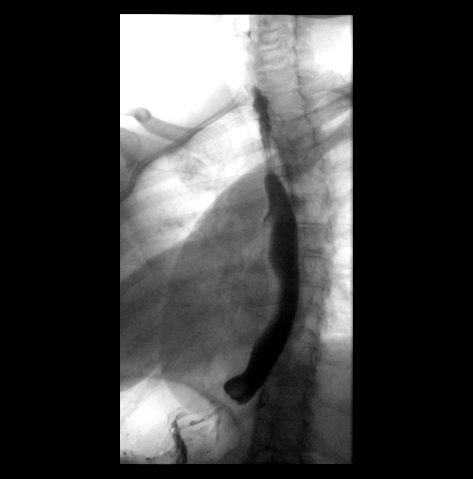

[14 of 16 positions shown; findings below may reference images not displayed]

FINDINGS: Swallowing mechanism is normal. There is normal primary esophageal
peristalsis. No esophageal fold thickening, stricture or
obstruction. A 13 mm barium tablet passed into the stomach without
difficulty.
IMPRESSION: Normal exam.

## 2016-10-03 ENCOUNTER — Ambulatory Visit (INDEPENDENT_AMBULATORY_CARE_PROVIDER_SITE_OTHER): Payer: Medicare Other | Admitting: Internal Medicine

## 2016-10-03 ENCOUNTER — Encounter: Payer: Self-pay | Admitting: Internal Medicine

## 2016-10-03 DIAGNOSIS — J301 Allergic rhinitis due to pollen: Secondary | ICD-10-CM

## 2016-10-03 DIAGNOSIS — J4521 Mild intermittent asthma with (acute) exacerbation: Secondary | ICD-10-CM | POA: Diagnosis not present

## 2016-10-03 NOTE — Progress Notes (Signed)
Patient ID: Samantha [name], female    DOB: 01/01/1946, 71 y.o.   MRN: [id number]  HPI female never smoker followed for allergic rhinitis, asthma, complicated by GERD Barium swallow 01/09/2016-normal --------------------------------------------------------------------------------------  10/03/2016- 71 year old female never smoker followed for allergic rhinitis, asthma, complicated by GERD Allergy vaccine at this office [place] 09/09/2016. Vaccine Clinic closed FOLLOWS FOR: Pt will go Asthma and Allergy clinic(Dr Kozlow). Pt completed her allergy vaccine; last injection was right before Christmas.  Upper respiratory infection a Christmas and then last week had a GI virus. Main risk is exposure to grandchildren with colds. Using albuterol rescue inhaler only occasionally. Dry cough this week but currently no fever or acute concern.  Review of Systems-see HPI   + = pos Constitutional:   No weight loss, night sweats,  Fevers, chills, fatigue, lassitude. HEENT:   No headaches,  Difficulty swallowing,  Tooth/dental problems,  Sore throat,                No sneezing, itching, or +ear ache,   +nasal congestion, post nasal drip, CV:  No- chest pain, orthopnea, PND, swelling in lower extremities, anasarca, dizziness, palpitations GI  No heartburn, indigestion, abdominal pain, nausea, vomiting,  Resp: No shortness of breath with exertion or at rest.  No excess mucus, no productive cough,  No                                No-non-productive cough,  No coughing up of blood.  No change in color of mucus.  +infrequent wheezing.   Skin: Clear GU:  MS:  No joint pain or swelling.   Psych:  No change in mood or affect. No depression or anxiety.  No memory loss.   Objective:   Physical Exam General- Alert, Oriented, Affect-appropriate, Distress- none acute; pleasant  Skin- Clear Lymphadenopathy- none Head- atraumatic            Eyes- Gross vision intact, PERRLA, conjunctivae clear secretions, +  periorbital edema            Ears- Normal hearing            Nose- clear, no-Septal dev, mucus, polyps, erosion, perforation .                        Throat- Malampatti III.   TMs not  retracted , mucosa a little red , drainage- none,                     tonsils- atrophic;  frequent throat clearing,  Neck- flexible , trachea midline, no stridor , thyroid nl, carotid no bruit Chest - symmetrical excursion , unlabored           Heart/CV- RRR , no murmur , no gallop  , no rub, nl s1 s2                           - JVD- none , edema- none, stasis changes- none, varices- none           Lung-  wheeze or rhonchi,-none cough-none , dullness-none, rub- none           Chest wall-  Abd-  Br/ Gen/ Rectal- Not done, not indicated Extrem- cyanosis- none, clubbing, none, atrophy- none, strength- nl.  Neuro- grossly intact to observation

## 2016-10-03 NOTE — Patient Instructions (Signed)
Ok to continue current meds and wait on  Allergy evaluation to see how you do this spring.. You can decide at any time to go for evaluation at an allergy office as we discussed.  Please call if we can help

## 2016-10-05 ENCOUNTER — Other Ambulatory Visit: Payer: Self-pay | Admitting: Obstetrics and Gynecology

## 2016-10-05 DIAGNOSIS — Z1231 Encounter for screening mammogram for malignant neoplasm of breast: Secondary | ICD-10-CM

## 2016-10-06 NOTE — Assessment & Plan Note (Signed)
Current control is good, uncomplicated after she resolved respiratory infection at Christmas. She may have a little residual postviral bronchitis with cough. Discussed conservative treatment and inhaler use.

## 2016-10-06 NOTE — Assessment & Plan Note (Signed)
She remains off of allergy vaccine. We discussed symptomatic OTC medications. She can seek formal reassessment later if needed.

## 2016-11-05 ENCOUNTER — Ambulatory Visit
Admission: RE | Admit: 2016-11-05 | Discharge: 2016-11-05 | Disposition: A | Discharge status: Home / Self Care | Payer: Medicare Other | Source: Ambulatory Visit | Attending: Obstetrics and Gynecology | Admitting: Obstetrics and Gynecology

## 2016-11-05 DIAGNOSIS — Z1231 Encounter for screening mammogram for malignant neoplasm of breast: Secondary | ICD-10-CM | POA: Diagnosis not present

## 2016-11-12 DIAGNOSIS — R1013 Epigastric pain: Secondary | ICD-10-CM | POA: Diagnosis not present

## 2016-11-12 DIAGNOSIS — R109 Unspecified abdominal pain: Secondary | ICD-10-CM | POA: Diagnosis not present

## 2016-11-12 DIAGNOSIS — Z86010 Personal history of colonic polyps: Secondary | ICD-10-CM | POA: Diagnosis not present

## 2016-11-12 DIAGNOSIS — K222 Esophageal obstruction: Secondary | ICD-10-CM | POA: Diagnosis not present

## 2016-11-12 DIAGNOSIS — K219 Gastro-esophageal reflux disease without esophagitis: Secondary | ICD-10-CM | POA: Diagnosis not present

## 2016-11-12 DIAGNOSIS — R110 Nausea: Secondary | ICD-10-CM | POA: Diagnosis not present

## 2016-11-14 ENCOUNTER — Ambulatory Visit (INDEPENDENT_AMBULATORY_CARE_PROVIDER_SITE_OTHER): Payer: Medicare Other | Admitting: Family Medicine

## 2016-11-14 ENCOUNTER — Ambulatory Visit (INDEPENDENT_AMBULATORY_CARE_PROVIDER_SITE_OTHER)
Admission: RE | Admit: 2016-11-14 | Discharge: 2016-11-14 | Disposition: A | Discharge status: Home / Self Care | Payer: Medicare Other | Source: Ambulatory Visit | Attending: Family Medicine | Admitting: Family Medicine

## 2016-11-14 DIAGNOSIS — Z7189 Other specified counseling: Secondary | ICD-10-CM | POA: Diagnosis not present

## 2016-11-14 DIAGNOSIS — Z712 Person consulting for explanation of examination or test findings: Secondary | ICD-10-CM

## 2016-11-14 DIAGNOSIS — R0609 Other forms of dyspnea: Secondary | ICD-10-CM | POA: Insufficient documentation

## 2016-11-14 DIAGNOSIS — I959 Hypotension, unspecified: Secondary | ICD-10-CM

## 2016-11-14 DIAGNOSIS — R0602 Shortness of breath: Secondary | ICD-10-CM | POA: Insufficient documentation

## 2016-11-14 DIAGNOSIS — R059 Cough, unspecified: Secondary | ICD-10-CM | POA: Insufficient documentation

## 2016-11-14 DIAGNOSIS — R05 Cough: Secondary | ICD-10-CM | POA: Diagnosis not present

## 2016-11-14 LAB — CBC WITH DIFFERENTIAL/PLATELET
Basophils Absolute: 0 10*3/uL (ref 0.0–0.1)
Basophils Relative: 0.6 % (ref 0.0–3.0)
Eosinophils Absolute: 0.1 10*3/uL (ref 0.0–0.7)
Eosinophils Relative: 1 % (ref 0.0–5.0)
HCT: 38.7 % (ref 36.0–46.0)
Hemoglobin: 13.2 g/dL (ref 12.0–15.0)
Lymphocytes Relative: 34.2 % (ref 12.0–46.0)
Lymphs Abs: 2.2 10*3/uL (ref 0.7–4.0)
MCHC: 34.1 g/dL (ref 30.0–36.0)
MCV: 84.5 fl (ref 78.0–100.0)
Monocytes Absolute: 0.6 10*3/uL (ref 0.1–1.0)
Monocytes Relative: 8.5 % (ref 3.0–12.0)
Neutro Abs: 3.7 10*3/uL (ref 1.4–7.7)
Neutrophils Relative %: 55.7 % (ref 43.0–77.0)
Platelets: 226 10*3/uL (ref 150.0–400.0)
RBC: 4.57 Mil/uL (ref 3.87–5.11)
RDW: 14.3 % (ref 11.5–15.5)
WBC: 6.6 10*3/uL (ref 4.0–10.5)

## 2016-11-14 LAB — TSH: TSH: 2.32 u[IU]/mL (ref 0.35–4.50)

## 2016-11-14 LAB — BRAIN NATRIURETIC PEPTIDE: Pro B Natriuretic peptide (BNP): 17 pg/mL (ref 0.0–100.0)

## 2016-11-14 MED ORDER — LOSARTAN POTASSIUM 50 MG PO TABS: 50.0000 mg | ORAL_TABLET | Freq: Every day | ORAL | 3 refills | Status: DC

## 2016-11-14 NOTE — Patient Instructions (Signed)
Great to see you.  I will call you with your chest xray and lab results.

## 2016-11-14 NOTE — Assessment & Plan Note (Signed)
Progressive with small pericardial effusion on CT and trace (new ) LE edema. Will check BNP and CXR today along with other labs to rule out CHF. If all normal, will defer back to pulmonary for their input/futher work up. The patient indicates understanding of these issues and agrees with the plan.

## 2016-11-14 NOTE — Progress Notes (Signed)
Pre visit review using our clinic review tool, if applicable. No additional management support is needed unless otherwise documented below in the visit note. 

## 2016-11-14 NOTE — Progress Notes (Signed)
Subjective:   Patient ID: Samantha [name], female    DOB: 1945/12/03, 71 y.o.   MRN: [id number]  Samantha [name] is a pleasant 71 y.o. year old female who presents to clinic today with URI (Has had a virus since December. Was seen for it here. Says within the last few days, she has had cough and dyspnea.) and Discuss GI Issue and Mammogram Results  on 11/14/2016  HPI:  Cough, nasal congestion, sinus pressure for months.  Now worsening cough, SOB for past few days.  Saw her pulmonologist, Dr. Annamaria Boots on 10/03/16. Note reviewed. No medication changed or images done.   Is scheduled colonoscopy and endoscopy on 12/14/16- with Dr. Alessandra Bevels with [facility]. Dr. Rosalie Gums did do a abdominal CT and labs and she would like to go over them with me.  She shows me the CT report which shows small pericardial effusion.  Having some LE edema and SOB without CP.   Current Outpatient Prescriptions on File Prior to Visit  Medication Sig Dispense Refill  . albuterol (PROVENTIL HFA) 108 (90 Base) MCG/ACT inhaler Inhale 2 puffs into the lungs every 6 (six) hours as needed. 3 Inhaler 3  . Ascorbic Acid (VITAMIN C) 500 MG tablet Take 500 mg by mouth daily.      . benzonatate (TESSALON) 100 MG capsule Take 100 mg by mouth. Take 1-2 tabs q8 prn cough    . bifidobacterium infantis (ALIGN) capsule Take 1 capsule by mouth daily.      . cetirizine (ZYRTEC) 10 MG tablet Take 10 mg by mouth daily.    . Cholecalciferol (VITAMIN D3) 400 UNITS tablet Take 400 Units by mouth 2 (two) times daily with a meal.     . Coenzyme Q-10 60 MG CAPS Take 1 capsule by mouth 2 (two) times daily.     . Cyanocobalamin (VITAMIN B-12) 1000 MCG SUBL Place 1 tablet under the tongue daily. Contains Folic Acid, B6, and Biotin    . Ginger, Zingiber officinalis, (GINGER PO) Take by mouth.    Marland Kitchen guaiFENesin (MUCINEX) 600 MG 12 hr tablet Take by mouth 2 (two) times daily as needed. Reported on 03/05/2016    . Magnesium Malate POWD (1250mg  tabs) 1  tablet by mouth two times a day    . Manganese 50 MG TABS Take 50 mg by mouth.    . Misc Natural Products (TART CHERRY ADVANCED PO) Take by mouth.    . montelukast (SINGULAIR) 10 MG tablet 1 daily 90 tablet 3  . Omega-3 Fatty Acids (OMEGA 3 PO) Take one tablet daily    . thiamine 100 MG tablet Take 100 mg by mouth daily.      . Triamcinolone Acetonide (NASACORT ALLERGY 24HR NA) Place 1 spray into the nose.    . TURMERIC PO Take by mouth daily.     No current facility-administered medications on file prior to visit.     Allergies  Allergen Reactions  . Cefuroxime Axetil     REACTION: pt not sure  . Sulfonamide Derivatives     REACTION: pt not sure    Past Medical History:  Diagnosis Date  . ALLERGIC RHINITIS 07/26/2008  . ASTHMA 04/14/2007  . ASYMPTOMATIC POSTMENOPAUSAL STATUS 05/10/2008  . CHEST PAIN 08/26/2008  . COLONIC POLYPS, HX OF 04/14/2007   colonic leiomyoma  . Episcleritis   . FIBROIDS, UTERUS 05/10/2008  . Fibromyalgia   . GERD 04/14/2007  . HYPERCHOLESTEROLEMIA 01/07/2008  . HYPERGLYCEMIA 11/28/2009  . HYPERTENSION 01/28/2008  .  INSOMNIA 05/10/2008  . Irritable bowel syndrome 04/06/2010  . OSTEOARTHRITIS 11/28/2009  . OSTEOPOROSIS 04/14/2007  . 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 SINUSITIS 02/10/2009    Past Surgical History:  Procedure Laterality Date  . COLONOSCOPY W/ POLYPECTOMY    . ELECTROCARDIOGRAM  12/04/2006  . LAPAROSCOPY    . NASAL POLYP SURGERY     Polypectomy and septoplasty  . Neck Injury  1983  . Stress Cardiolite  02/13/2002    Family History  Problem Relation Age of Onset  . Heart disease Father     CHF  . Cancer Neg Hx     No FH of Colon Cancer    Social History   Social History  . Marital status: Widowed    Spouse name: N/A  . Number of children: 3  . Years of education: N/A   Occupational History  .  Retired    Retired Stage manager)   Social History Main Topics  . Smoking status: Never Smoker  . Smokeless tobacco: Never Used  .  Alcohol use No  . Drug use: No  . Sexual activity: No   Other Topics Concern  . Not on file   Social History Narrative   Lives alone (widowed).   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      Does not have living will.  Does desire CPR.  Does not want life support for prolonged periods of time if futile.   The PMH, PSH, Social History, Family History, Medications, and allergies have been reviewed in [facility], and have been updated if relevant.   Review of Systems  HENT: Negative.   Eyes: Negative.   Respiratory: Positive for cough and shortness of breath. Negative for apnea, choking, chest tightness, wheezing and stridor.   Cardiovascular: Positive for leg swelling. Negative for chest pain and palpitations.  Gastrointestinal: Negative.   Endocrine: Negative.   Genitourinary: Negative.   Musculoskeletal: Negative.   Skin: Negative.   Neurological: Negative.   Hematological: Negative.   Psychiatric/Behavioral: Negative.   All other systems reviewed and are negative.      Objective:    BP 140/88 (BP Location: Left Arm, Patient Position: Sitting, Cuff Size: Normal)   Pulse (!) 105   Temp 98.1 F (36.7 C) (Oral)   Resp (!) 22   Wt 142 lb (64.4 kg)   SpO2 96%   BMI 25.97 kg/m    Physical Exam  Constitutional: She is oriented to person, place, and time. She appears well-developed and well-nourished. No distress.  HENT:  Head: Normocephalic and atraumatic.  Eyes: Conjunctivae are normal.  Cardiovascular: Normal rate and regular rhythm.   Pulmonary/Chest: She has decreased breath sounds. She has no wheezes. She has no rhonchi. She has no rales.  Musculoskeletal: She exhibits edema.  Trace bilateral LE edema  Neurological: She is alert and oriented to person, place, and time. No cranial nerve deficit.  Skin: Skin is warm and dry. She is not diaphoretic.  Psychiatric: She has a normal mood and affect. Her behavior is normal. Judgment and thought content normal.  Nursing  note and vitals reviewed.         Assessment & Plan:   Cough - Plan: DG Chest 2 View  Encounter to discuss test results  SOB (shortness of breath) - Plan: Brain natriuretic peptide, CBC with Differential/Platelet, TSH  Hypotension, unspecified hypotension type - Plan: losartan (COZAAR) 50 MG tablet No Follow-up on file.

## 2016-11-15 ENCOUNTER — Telehealth: Payer: Self-pay | Admitting: Internal Medicine

## 2016-11-15 NOTE — Telephone Encounter (Signed)
Called and spoke to pt. Pt c/o prod cough with clear mucus, sinus congestion with headache, increase in SOB x 3-4 days. Pt denies f/c/s and CP/tightness. Pt states she saw her PCP on 3.7.18 and had a CXR, pt was told by PCP to follow up with pulmonary regarding her s/s.   Dr. Annamaria Boots please advise. Thanks.   Allergies  Allergen Reactions  . Cefuroxime Axetil     REACTION: pt not sure  . Sulfonamide Derivatives     REACTION: pt not sure    Current Outpatient Prescriptions on File Prior to Visit  Medication Sig Dispense Refill  . albuterol (PROVENTIL HFA) 108 (90 Base) MCG/ACT inhaler Inhale 2 puffs into the lungs every 6 (six) hours as needed. 3 Inhaler 3  . Ascorbic Acid (VITAMIN C) 500 MG tablet Take 500 mg by mouth daily.      . benzonatate (TESSALON) 100 MG capsule Take 100 mg by mouth. Take 1-2 tabs q8 prn cough    . bifidobacterium infantis (ALIGN) capsule Take 1 capsule by mouth daily.      . cetirizine (ZYRTEC) 10 MG tablet Take 10 mg by mouth daily.    . Cholecalciferol (VITAMIN D3) 400 UNITS tablet Take 400 Units by mouth 2 (two) times daily with a meal.     . Coenzyme Q-10 60 MG CAPS Take 1 capsule by mouth 2 (two) times daily.     . Cyanocobalamin (VITAMIN B-12) 1000 MCG SUBL Place 1 tablet under the tongue daily. Contains Folic Acid, B6, and Biotin    . Ginger, Zingiber officinalis, (GINGER PO) Take by mouth.    Marland Kitchen guaiFENesin (MUCINEX) 600 MG 12 hr tablet Take by mouth 2 (two) times daily as needed. Reported on 03/05/2016    . losartan (COZAAR) 50 MG tablet Take 1 tablet (50 mg total) by mouth daily. 90 tablet 3  . Magnesium Malate POWD (1250mg  tabs) 1 tablet by mouth two times a day    . Manganese 50 MG TABS Take 50 mg by mouth.    . Misc Natural Products (TART CHERRY ADVANCED PO) Take by mouth.    . montelukast (SINGULAIR) 10 MG tablet 1 daily 90 tablet 3  . Omega-3 Fatty Acids (OMEGA 3 PO) Take one tablet daily    . pantoprazole (PROTONIX) 40 MG tablet Take 40 mg by mouth  daily.    Marland Kitchen thiamine 100 MG tablet Take 100 mg by mouth daily.      . Triamcinolone Acetonide (NASACORT ALLERGY 24HR NA) Place 1 spray into the nose.    . TURMERIC PO Take by mouth daily.     No current facility-administered medications on file prior to visit.

## 2016-11-15 NOTE — Telephone Encounter (Signed)
Patient is returning phone call.  °

## 2016-11-15 NOTE — Telephone Encounter (Signed)
lmtcb x1 for pt. 

## 2016-11-15 NOTE — Telephone Encounter (Signed)
You didn't indicate if her PCP gave any Rx- antibiotic or etc. This sounds viral, so don't expect antibiotic to do much now.  Suggest symptom therapy- stay well hydrated. Try an otc like Tylenol Cold and Flu, and something with zinc, like otc Zicam.

## 2016-11-15 NOTE — Telephone Encounter (Signed)
Spoke with pt. She is aware of CY's recommendations. Her PCP did not give her an antibiotic. Nothing further was needed.

## 2016-11-27 ENCOUNTER — Telehealth: Payer: Self-pay | Admitting: Internal Medicine

## 2016-11-27 DIAGNOSIS — J301 Allergic rhinitis due to pollen: Secondary | ICD-10-CM

## 2016-11-27 DIAGNOSIS — J4520 Mild intermittent asthma, uncomplicated: Secondary | ICD-10-CM

## 2016-11-27 MED ORDER — BENZONATATE 100 MG PO CAPS: 100.0000 mg | ORAL_CAPSULE | Freq: Three times a day (TID) | ORAL | 1 refills | Status: DC | PRN

## 2016-11-27 NOTE — Telephone Encounter (Signed)
Yes ok to refer. She can call herself if she wants. They can see our notes in Epic

## 2016-11-27 NOTE — Telephone Encounter (Signed)
CY ok to refer to Dr. Carmelina Peal?  thanks

## 2016-11-27 NOTE — Telephone Encounter (Signed)
Spoke with pt, requesting a referral for Dr. Carmelina Peal.  States she has an appt scheduled in May 2.   CY ok to place referral?  Pt also notes some residual prod cough, chest and sinus congestion.  Pt is concerned because she is scheduled for a colonoscopy on 12/14/16 and she will have to reschedule if her cough is not controlled.  Pt has been taking maintenance inhalers as well as zicam per CY's previous recs.  Pt has tessalon perles at home but they are expired.  Requesting further recs.    Pt uses [facility].    CY please advise.  Thanks.   Last ov: 10/03/16 Next ov: 04/08/17  Allergies  Allergen Reactions  . Cefuroxime Axetil     REACTION: pt not sure  . Sulfonamide Derivatives     REACTION: pt not sure   Current Outpatient Prescriptions on File Prior to Visit  Medication Sig Dispense Refill  . albuterol (PROVENTIL HFA) 108 (90 Base) MCG/ACT inhaler Inhale 2 puffs into the lungs every 6 (six) hours as needed. 3 Inhaler 3  . Ascorbic Acid (VITAMIN C) 500 MG tablet Take 500 mg by mouth daily.      . benzonatate (TESSALON) 100 MG capsule Take 100 mg by mouth. Take 1-2 tabs q8 prn cough    . bifidobacterium infantis (ALIGN) capsule Take 1 capsule by mouth daily.      . cetirizine (ZYRTEC) 10 MG tablet Take 10 mg by mouth daily.    . Cholecalciferol (VITAMIN D3) 400 UNITS tablet Take 400 Units by mouth 2 (two) times daily with a meal.     . Coenzyme Q-10 60 MG CAPS Take 1 capsule by mouth 2 (two) times daily.     . Cyanocobalamin (VITAMIN B-12) 1000 MCG SUBL Place 1 tablet under the tongue daily. Contains Folic Acid, B6, and Biotin    . Ginger, Zingiber officinalis, (GINGER PO) Take by mouth.    Marland Kitchen guaiFENesin (MUCINEX) 600 MG 12 hr tablet Take by mouth 2 (two) times daily as needed. Reported on 03/05/2016    . losartan (COZAAR) 50 MG tablet Take 1 tablet (50 mg total) by mouth daily. 90 tablet 3  . Magnesium Malate POWD (1250mg  tabs) 1 tablet by mouth two times a day    . Manganese 50 MG  TABS Take 50 mg by mouth.    . Misc Natural Products (TART CHERRY ADVANCED PO) Take by mouth.    . montelukast (SINGULAIR) 10 MG tablet 1 daily 90 tablet 3  . Omega-3 Fatty Acids (OMEGA 3 PO) Take one tablet daily    . pantoprazole (PROTONIX) 40 MG tablet Take 40 mg by mouth daily.    Marland Kitchen thiamine 100 MG tablet Take 100 mg by mouth daily.      . Triamcinolone Acetonide (NASACORT ALLERGY 24HR NA) Place 1 spray into the nose.    . TURMERIC PO Take by mouth daily.     No current facility-administered medications on file prior to visit.

## 2016-11-27 NOTE — Telephone Encounter (Signed)
Referral made for Dr. Carmelina Peal and Rx sent to preferred pharmacy. Pt aware and voiced her understanding. Nothing further needed.

## 2016-11-27 NOTE — Telephone Encounter (Signed)
Ok to call in refill for her tessalon perles

## 2016-12-03 DIAGNOSIS — H40003 Preglaucoma, unspecified, bilateral: Secondary | ICD-10-CM | POA: Diagnosis not present

## 2016-12-07 DIAGNOSIS — J324 Chronic pansinusitis: Secondary | ICD-10-CM | POA: Diagnosis not present

## 2016-12-07 DIAGNOSIS — J342 Deviated nasal septum: Secondary | ICD-10-CM | POA: Insufficient documentation

## 2016-12-07 DIAGNOSIS — J302 Other seasonal allergic rhinitis: Secondary | ICD-10-CM | POA: Diagnosis not present

## 2016-12-07 DIAGNOSIS — J343 Hypertrophy of nasal turbinates: Secondary | ICD-10-CM | POA: Diagnosis not present

## 2016-12-17 DIAGNOSIS — H40003 Preglaucoma, unspecified, bilateral: Secondary | ICD-10-CM | POA: Diagnosis not present

## 2017-01-01 DIAGNOSIS — J302 Other seasonal allergic rhinitis: Secondary | ICD-10-CM | POA: Diagnosis not present

## 2017-01-01 DIAGNOSIS — R51 Headache: Secondary | ICD-10-CM | POA: Diagnosis not present

## 2017-01-01 DIAGNOSIS — J322 Chronic ethmoidal sinusitis: Secondary | ICD-10-CM | POA: Diagnosis not present

## 2017-01-01 DIAGNOSIS — J342 Deviated nasal septum: Secondary | ICD-10-CM | POA: Diagnosis not present

## 2017-01-01 DIAGNOSIS — J343 Hypertrophy of nasal turbinates: Secondary | ICD-10-CM | POA: Diagnosis not present

## 2017-01-09 ENCOUNTER — Ambulatory Visit: Payer: Medicare Other | Admitting: Allergy and Immunology

## 2017-01-11 DIAGNOSIS — K319 Disease of stomach and duodenum, unspecified: Secondary | ICD-10-CM | POA: Diagnosis not present

## 2017-01-11 DIAGNOSIS — D122 Benign neoplasm of ascending colon: Secondary | ICD-10-CM | POA: Diagnosis not present

## 2017-01-11 DIAGNOSIS — R1013 Epigastric pain: Secondary | ICD-10-CM | POA: Diagnosis not present

## 2017-01-11 DIAGNOSIS — D123 Benign neoplasm of transverse colon: Secondary | ICD-10-CM | POA: Diagnosis not present

## 2017-01-11 DIAGNOSIS — K5730 Diverticulosis of large intestine without perforation or abscess without bleeding: Secondary | ICD-10-CM | POA: Diagnosis not present

## 2017-01-11 DIAGNOSIS — R110 Nausea: Secondary | ICD-10-CM | POA: Diagnosis not present

## 2017-01-11 DIAGNOSIS — Z86010 Personal history of colonic polyps: Secondary | ICD-10-CM | POA: Diagnosis not present

## 2017-01-11 DIAGNOSIS — K649 Unspecified hemorrhoids: Secondary | ICD-10-CM | POA: Diagnosis not present

## 2017-01-11 DIAGNOSIS — K3189 Other diseases of stomach and duodenum: Secondary | ICD-10-CM | POA: Diagnosis not present

## 2017-01-11 DIAGNOSIS — K210 Gastro-esophageal reflux disease with esophagitis: Secondary | ICD-10-CM | POA: Diagnosis not present

## 2017-01-11 DIAGNOSIS — K644 Residual hemorrhoidal skin tags: Secondary | ICD-10-CM | POA: Diagnosis not present

## 2017-01-11 DIAGNOSIS — Q438 Other specified congenital malformations of intestine: Secondary | ICD-10-CM | POA: Diagnosis not present

## 2017-01-11 LAB — HM COLONOSCOPY

## 2017-01-15 DIAGNOSIS — K319 Disease of stomach and duodenum, unspecified: Secondary | ICD-10-CM | POA: Diagnosis not present

## 2017-01-15 DIAGNOSIS — D122 Benign neoplasm of ascending colon: Secondary | ICD-10-CM | POA: Diagnosis not present

## 2017-01-15 DIAGNOSIS — D123 Benign neoplasm of transverse colon: Secondary | ICD-10-CM | POA: Diagnosis not present

## 2017-01-16 NOTE — Progress Notes (Signed)
Office Visit Note  Patient: Samantha [name]             Date of Birth: [birth year]           MRN: [id number]             PCP: Lucille Passy, MD Referring: Lucille Passy, MD Visit Date: 01/21/2017 Occupation: @GUAROCC @    Subjective:  Left hip pain   History of Present Illness: Samantha [name] is a 71 y.o. female with history of fibromyalgia and osteoarthritis. She states she's been having some discomfort in the right SI joint. She has some discomfort in her right knee joint. She had recent colonoscopy and had some polyps removed. Fibromyalgia is fairly well controlled. She reports pain on 0-10 about 6.  Activities of Daily Living:  Patient reports morning stiffness for 10 minutes.   Patient Reports nocturnal pain.  Difficulty dressing/grooming: Denies Difficulty climbing stairs: Reports Difficulty getting out of chair: Reports Difficulty using hands for taps, buttons, cutlery, and/or writing: Denies   Review of Systems  Constitutional: Positive for fatigue. Negative for night sweats, weight gain, weight loss and weakness.  HENT: Negative for mouth sores, trouble swallowing, trouble swallowing and nose dryness.   Eyes: Positive for dryness. Negative for pain, redness and visual disturbance.  Respiratory: Negative for cough, shortness of breath and difficulty breathing.   Cardiovascular: Positive for hypertension. Negative for chest pain, palpitations, irregular heartbeat and swelling in legs/feet.  Gastrointestinal: Negative for blood in stool, constipation and diarrhea.  Endocrine: Negative for increased urination.  Genitourinary: Negative for vaginal dryness.  Musculoskeletal: Positive for arthralgias, joint pain, myalgias, morning stiffness and myalgias. Negative for joint swelling, muscle weakness and muscle tenderness.  Skin: Negative for color change, rash, hair loss, skin tightness, ulcers and sensitivity to sunlight.  Allergic/Immunologic: Negative for susceptible to  infections.  Neurological: Negative for dizziness, memory loss and night sweats.  Hematological: Negative for swollen glands.  Psychiatric/Behavioral: Positive for sleep disturbance. Negative for depressed mood. The patient is not nervous/anxious.     PMFS History:  Patient Active Problem List   Diagnosis Date Noted  . 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/19/2017  . Primary osteoarthritis of both knees 01/19/2017  . Primary osteoarthritis of both feet 01/19/2017  . Cough 11/14/2016  . Encounter to discuss test results 11/14/2016  . SOB (shortness of breath) 11/14/2016  . Acute upper respiratory infection 08/23/2016  . Diverticulosis 03/19/2016  . Lumbar compression fracture ([place]) 06/20/2015  . Increased pressure in the eye 06/02/2014  . Fibromyalgia 02/26/2013  . Allergic rhinitis due to pollen 11/20/2010  . 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 IRRITABLE BOWEL SYNDROME 04/06/2010  . THYROID NODULE, RIGHT 12/07/2009  . OSTEOARTHRITIS 11/28/2009  . FIBROIDS, UTERUS 05/10/2008  . Insomnia 05/10/2008  . ASYMPTOMATIC POSTMENOPAUSAL STATUS 05/10/2008  . Essential hypertension 01/28/2008  . HYPERCHOLESTEROLEMIA 01/07/2008  . GLAUCOMA 01/07/2008  . Asthma, mild intermittent 04/14/2007  . GERD 04/14/2007  . History of colonic polyps 04/14/2007    Past Medical History:  Diagnosis Date  . ALLERGIC RHINITIS 07/26/2008  . ASTHMA 04/14/2007  . ASYMPTOMATIC POSTMENOPAUSAL STATUS 05/10/2008  . CHEST PAIN 08/26/2008  . COLONIC POLYPS, HX OF 04/14/2007   colonic leiomyoma  . Episcleritis   . FIBROIDS, UTERUS 05/10/2008  . Fibromyalgia   . GERD 04/14/2007  . HYPERCHOLESTEROLEMIA 01/07/2008  . HYPERGLYCEMIA 11/28/2009  . HYPERTENSION 01/28/2008  . INSOMNIA 05/10/2008  . Irritable bowel syndrome 04/06/2010  . OSTEOARTHRITIS 11/28/2009  . OSTEOPOROSIS 04/14/2007  .  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 SINUSITIS 02/10/2009    Family History  Problem Relation Age of Onset  . Heart disease Father        CHF   . Cancer Neg Hx        No FH of Colon Cancer   Past Surgical History:  Procedure Laterality Date  . COLONOSCOPY W/ POLYPECTOMY    . ELECTROCARDIOGRAM  12/04/2006  . LAPAROSCOPY    . NASAL POLYP SURGERY     Polypectomy and septoplasty  . Neck Injury  1983  . Stress Cardiolite  02/13/2002   Social History   Social History Narrative   Lives alone (widowed).   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      Does not have living will.  Does desire CPR.  Does not want life support for prolonged periods of time if futile.     Objective: Vital Signs: BP (!) 151/67 (BP Location: Right Arm, Patient Position: Sitting, Cuff Size: Normal)   Pulse 70   Resp 16   Ht 5\' 2"  (1.575 m)   Wt 144 lb (65.3 kg)   BMI 26.34 kg/m    Physical Exam  Constitutional: She is oriented to person, place, and time. She appears well-developed and well-nourished.  HENT:  Head: Normocephalic and atraumatic.  Eyes: Conjunctivae and EOM are normal.  Neck: Normal range of motion.  Cardiovascular: Normal rate, regular rhythm, normal heart sounds and intact distal pulses.   Pulmonary/Chest: Effort normal and breath sounds normal.  Abdominal: Soft. Bowel sounds are normal.  Lymphadenopathy:    She has no cervical adenopathy.  Neurological: She is alert and oriented to person, place, and time.  Skin: Skin is warm and dry. Capillary refill takes less than 2 seconds.  Psychiatric: She has a normal mood and affect. Her behavior is normal.  Nursing note and vitals reviewed.    Musculoskeletal Exam: C-spine and thoracic lumbar spine good range of motion. Shoulder joints elbow joints wrist joint MCPs PIPs DIPs with good range of motion. She had tenderness on palpation over her right SI joint. Hip joints knee joints ankles MTPs PIPs are good range of motion with no synovitis. Fibromyalgia tender points were 11 out of 18 positive.  CDAI Exam: No CDAI exam completed.    Investigation: Findings:  DEXA October  2016.  Her T-score was -0.09   CBC Latest Ref Rng & Units 11/14/2016 06/27/2016 05/30/2015  WBC 4.0 - 10.5 K/uL 6.6 5.6 5.6  Hemoglobin 12.0 - 15.0 g/dL 13.2 13.0 12.5  Hematocrit 36.0 - 46.0 % 38.7 38.3 37.2  Platelets 150.0 - 400.0 K/uL 226.0 204.0 194.0    CMP Latest Ref Rng & Units 06/27/2016 05/30/2015 04/04/2015  Glucose 70 - 99 mg/dL 90 108(H) 89  BUN 6 - 23 mg/dL 14 13 19   Creatinine 0.40 - 1.20 mg/dL 0.87 0.87 0.91  Sodium 135 - 145 mEq/L 141 140 138  Potassium 3.5 - 5.1 mEq/L 4.3 3.9 3.9  Chloride 96 - 112 mEq/L 104 105 103  CO2 19 - 32 mEq/L 30 29 29   Calcium 8.4 - 10.5 mg/dL 10.2 9.4 9.8  Total Protein 6.0 - 8.3 g/dL 7.4 7.1 7.1  Total Bilirubin 0.2 - 1.2 mg/dL 0.5 0.4 0.3  Alkaline Phos 39 - 117 U/L 79 57 61  AST 0 - 37 U/L 20 16 17   ALT 0 - 35 U/L 11 8 10    Imaging: No results found.  Speciality Comments: No specialty comments available.  Procedures:  Large Joint Inj Date/Time: 01/21/2017 12:27 PM Performed by: Bo Merino Authorized by: Bo Merino   Consent Given by:  Patient Site marked: the procedure site was marked   Timeout: prior to procedure the correct patient, procedure, and site was verified   Indications:  Pain Location:  Sacroiliac Site:  R sacroiliac joint Prep: patient was prepped and draped in usual sterile fashion   Needle Size:  27 G Needle Length:  1.5 inches Approach:  Superior Ultrasound Guidance: No   Fluoroscopic Guidance: No   Arthrogram: No   Medications:  1 mL lidocaine 1 %; 30 mg triamcinolone acetonide 40 MG/ML Aspiration Attempted: Yes   Aspirate amount (mL):  0 Patient tolerance:  Patient tolerated the procedure well with no immediate complications   Allergies: Cefuroxime axetil and Sulfonamide derivatives   Assessment / Plan:     Visit Diagnoses: Fibromyalgia: She continues to have some generalized pain and positive tender points and discomfort.  Right SI joint pain: She's been having pain and discomfort  for the last few weeks and difficulty walking. She also has sometimes nocturnal pain. After different treatment options were discussed the right SI joint was injected with cortisone. The procedures described above. Her blood pressure is elevated at advised her to monitor blood pressure closely.  Other fatigue: Chronic fatigue  Raynaud's disease without gangrene: Not active currently  Primary osteoarthritis of both hips - Bilateral moderate: Chronic discomfort  Primary osteoarthritis of both knees: Joint protection and muscle strengthening weight loss was discussed.  Primary osteoarthritis of both feet: Proper fitting shoes were discussed.  Bilateral carpal tunnel syndrome: Not symptomatic currently  History of hypertension: Have advised her to monitor blood pressure closely  History of L4 compression fracture - T score -0.9 2016  Other medical problems are listed as follows:  History of hyperlipidemia  History of IBS  History of gastroesophageal reflux (GERD)  History of kidney stones  History of asthma  History of diverticulosis  History of glaucoma    Orders: Orders Placed This Encounter  Procedures  . Large Joint Injection/Arthrocentesis   No orders of the defined types were placed in this encounter.   Face-to-face time spent with patient was 30 minutes. 50% of time was spent in counseling and coordination of care.  Follow-Up Instructions: Return in about 6 months (around 07/24/2017) for Osteoarthritis,FMS.   Bo Merino, MD  Note - This record has been created using Editor, commissioning.  Chart creation errors have been sought, but may not always  have been located. Such creation errors do not reflect on  the standard of medical care.

## 2017-01-19 DIAGNOSIS — M170 Bilateral primary osteoarthritis of knee: Secondary | ICD-10-CM | POA: Insufficient documentation

## 2017-01-19 DIAGNOSIS — M19072 Primary osteoarthritis, left ankle and foot: Secondary | ICD-10-CM | POA: Insufficient documentation

## 2017-01-19 DIAGNOSIS — M160 Bilateral primary osteoarthritis of hip: Secondary | ICD-10-CM | POA: Insufficient documentation

## 2017-01-19 DIAGNOSIS — M19071 Primary osteoarthritis, right ankle and foot: Secondary | ICD-10-CM | POA: Insufficient documentation

## 2017-01-21 ENCOUNTER — Ambulatory Visit (INDEPENDENT_AMBULATORY_CARE_PROVIDER_SITE_OTHER): Payer: Medicare Other | Admitting: Rheumatology

## 2017-01-21 ENCOUNTER — Encounter: Payer: Self-pay | Admitting: Rheumatology

## 2017-01-21 VITALS — BP 151/67 | HR 70 | Resp 16 | Ht 62.0 in | Wt 144.0 lb

## 2017-01-21 DIAGNOSIS — G8929 Other chronic pain: Secondary | ICD-10-CM

## 2017-01-21 DIAGNOSIS — R5383 Other fatigue: Secondary | ICD-10-CM | POA: Diagnosis not present

## 2017-01-21 DIAGNOSIS — Z8719 Personal history of other diseases of the digestive system: Secondary | ICD-10-CM | POA: Diagnosis not present

## 2017-01-21 DIAGNOSIS — Z8669 Personal history of other diseases of the nervous system and sense organs: Secondary | ICD-10-CM

## 2017-01-21 DIAGNOSIS — I7300 Raynaud's syndrome without gangrene: Secondary | ICD-10-CM

## 2017-01-21 DIAGNOSIS — M797 Fibromyalgia: Secondary | ICD-10-CM

## 2017-01-21 DIAGNOSIS — S32040D Wedge compression fracture of fourth lumbar vertebra, subsequent encounter for fracture with routine healing: Secondary | ICD-10-CM

## 2017-01-21 DIAGNOSIS — Z8679 Personal history of other diseases of the circulatory system: Secondary | ICD-10-CM | POA: Diagnosis not present

## 2017-01-21 DIAGNOSIS — M160 Bilateral primary osteoarthritis of hip: Secondary | ICD-10-CM

## 2017-01-21 DIAGNOSIS — M533 Sacrococcygeal disorders, not elsewhere classified: Secondary | ICD-10-CM

## 2017-01-21 DIAGNOSIS — Z8639 Personal history of other endocrine, nutritional and metabolic disease: Secondary | ICD-10-CM | POA: Diagnosis not present

## 2017-01-21 DIAGNOSIS — M19071 Primary osteoarthritis, right ankle and foot: Secondary | ICD-10-CM | POA: Diagnosis not present

## 2017-01-21 DIAGNOSIS — Z8709 Personal history of other diseases of the respiratory system: Secondary | ICD-10-CM

## 2017-01-21 DIAGNOSIS — M19072 Primary osteoarthritis, left ankle and foot: Secondary | ICD-10-CM

## 2017-01-21 DIAGNOSIS — M170 Bilateral primary osteoarthritis of knee: Secondary | ICD-10-CM | POA: Diagnosis not present

## 2017-01-21 DIAGNOSIS — G5603 Carpal tunnel syndrome, bilateral upper limbs: Secondary | ICD-10-CM

## 2017-01-21 DIAGNOSIS — Z87442 Personal history of urinary calculi: Secondary | ICD-10-CM | POA: Diagnosis not present

## 2017-01-21 MED ORDER — TRIAMCINOLONE ACETONIDE 40 MG/ML IJ SUSP
30.0000 mg | INTRAMUSCULAR | Status: AC | PRN
  Administered 2017-01-21: 30 mg via INTRA_ARTICULAR

## 2017-01-21 MED ORDER — LIDOCAINE HCL 1 % IJ SOLN
1.0000 mL | INTRAMUSCULAR | Status: AC | PRN
  Administered 2017-01-21: 1 mL

## 2017-01-30 ENCOUNTER — Encounter: Payer: Self-pay | Admitting: Allergy and Immunology

## 2017-01-30 ENCOUNTER — Ambulatory Visit (INDEPENDENT_AMBULATORY_CARE_PROVIDER_SITE_OTHER): Payer: Medicare Other | Admitting: Allergy and Immunology

## 2017-01-30 VITALS — BP 138/64 | HR 70 | Temp 97.8°F | Resp 16 | Ht 61.5 in | Wt 142.0 lb

## 2017-01-30 DIAGNOSIS — J4520 Mild intermittent asthma, uncomplicated: Secondary | ICD-10-CM

## 2017-01-30 DIAGNOSIS — Z01419 Encounter for gynecological examination (general) (routine) without abnormal findings: Secondary | ICD-10-CM | POA: Diagnosis not present

## 2017-01-30 DIAGNOSIS — N762 Acute vulvitis: Secondary | ICD-10-CM | POA: Diagnosis not present

## 2017-01-30 DIAGNOSIS — Z124 Encounter for screening for malignant neoplasm of cervix: Secondary | ICD-10-CM | POA: Diagnosis not present

## 2017-01-30 DIAGNOSIS — N644 Mastodynia: Secondary | ICD-10-CM | POA: Diagnosis not present

## 2017-01-30 DIAGNOSIS — K219 Gastro-esophageal reflux disease without esophagitis: Secondary | ICD-10-CM | POA: Diagnosis not present

## 2017-01-30 DIAGNOSIS — Z6825 Body mass index (BMI) 25.0-25.9, adult: Secondary | ICD-10-CM | POA: Diagnosis not present

## 2017-01-30 DIAGNOSIS — J3089 Other allergic rhinitis: Secondary | ICD-10-CM | POA: Diagnosis not present

## 2017-01-30 NOTE — Patient Instructions (Addendum)
  1. Use dehumidifier in the house  2. Treat and prevent inflammation:   A. Qnasl 80 - 1-2 sprays each nostril one time per day  B. continue montelukast 10 mg - one tablet one time per day  3. Treat and prevent reflux:   A. eliminate consumption of caffeine and chocolate as much as possible  B. continue pantoprazole 40 mg - one tablet one time per day  C. elevate head of bed  D. no late meals  4. If needed:   A. ProAir HFA 2 puffs every 4-6 hours  B. nasal saline wash  C. OTC antihistamine - Claritin/Zyrtec/Allegra   D. Pataday one drop each eye one time per day   5. Return to clinic in 4 weeks or earlier if problem

## 2017-01-30 NOTE — Progress Notes (Signed)
Dear Dr. Annamaria Boots,  Thank you for referring Samantha [name] to the [facility] on 01/30/2017.   Below is a summation of this patient's evaluation and recommendations.  Thank you for your referral. I will keep you informed about this patient's response to treatment.   If you have any questions please do not hesitate to contact me.   Sincerely,  Jiles Prows, MD Allergy / Immunology [place]   ______________________________________________________________________    NEW PATIENT NOTE  Referring Provider: Deneise Lever, MD Primary Provider: Lucille Passy, MD Date of office visit: 01/30/2017    Subjective:   Chief Complaint:  Samantha [name] (DOB: 02-Jun-1946) is a 71 y.o. female who presents to the clinic on 01/30/2017 with a chief complaint of New Evaluation (Transfer of care from Dr. Annamaria Boots) .     HPI: [name] presents this clinic in evaluation of allergies and asthma.  She has been a long-term patient of Dr. Keturah Barre and was receiving immunotherapy for approximately 6 years which she discontinued in December 2017. Although she thought that immunotherapy helped her somewhat she still had difficulty with her upper airways and her eyes. She has nasal congestion and sneezing and itchy red watery eyes that appears to be precipitated by exposure to the outdoors especially during the spring time. She has tried various medications in the past to treat this issue but she has some side effects especially with the use of nasal steroids. Apparently she develop significant GI upset when using nasal steroids and she is now relying on the use of Nasalcrom. She uses a mask when she goes outdoors especially during the spring.  In addition to her upper airway and eye issues she also has a problem with intermittent asthma. Her asthma is manifested as chest tightness and sternal chest pain that occasionally  migrates to the back. It does not sound as though she has required a systemic steroid in over a year to treat any type of significant asthma activity. She rarely uses a short acting bronchodilator averaging out to less than once a week and for the most part can exert herself without any problem. Usually when she develops a upper respiratory tract infection she will have problems with her asthma including her chest pain and an chest tightness but also cough and throat clearing and mucous in her throat.  It should be noted that Lugene does have a history of reflux for which she is using a proton pump inhibitor. She does consume chocolate almost every day.  Natori has visited with Dr. Wilburn Cornelia who performed a CT scan of her sinuses this year which did not identify any significant amount of sinus disease.  Past Medical History:  Diagnosis Date  . ALLERGIC RHINITIS 07/26/2008  . ASTHMA 04/14/2007  . Asthma   . ASYMPTOMATIC POSTMENOPAUSAL STATUS 05/10/2008  . CHEST PAIN 08/26/2008  . COLONIC POLYPS, HX OF 04/14/2007   colonic leiomyoma  . Episcleritis   . FIBROIDS, UTERUS 05/10/2008  . Fibromyalgia   . GERD 04/14/2007  . HYPERCHOLESTEROLEMIA 01/07/2008  . HYPERGLYCEMIA 11/28/2009  . HYPERTENSION 01/28/2008  . INSOMNIA 05/10/2008  . Irritable bowel syndrome 04/06/2010  . OSTEOARTHRITIS 11/28/2009  . OSTEOPOROSIS 04/14/2007  . 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 SINUSITIS 02/10/2009    Past Surgical History:  Procedure Laterality Date  . COLONOSCOPY W/ POLYPECTOMY    . ELECTROCARDIOGRAM  12/04/2006  . LAPAROSCOPY    .  NASAL POLYP SURGERY     Polypectomy and septoplasty  . Neck Injury  1983  . Stress Cardiolite  02/13/2002    Allergies as of 01/30/2017      Reactions   Cefuroxime Axetil    REACTION: pt not sure   Sulfonamide Derivatives    REACTION: pt not sure      Medication List      albuterol 108 (90 Base) MCG/ACT inhaler Commonly known as:  PROVENTIL HFA Inhale 2 puffs into the lungs every  6 (six) hours as needed.   aspirin EC 81 MG tablet Take by mouth.   bifidobacterium infantis capsule Take 1 capsule by mouth daily.   BLACK CURRANT SEED OIL PO Take by mouth.   cetirizine 10 MG tablet Commonly known as:  ZYRTEC Take 10 mg by mouth daily.   Coenzyme Q-10 60 MG Caps Take 1 capsule by mouth 2 (two) times daily.   GINGER PO Take by mouth.   guaiFENesin 600 MG 12 hr tablet Commonly known as:  MUCINEX Take by mouth 2 (two) times daily as needed. Reported on 03/05/2016   losartan 50 MG tablet Commonly known as:  COZAAR Take 1 tablet (50 mg total) by mouth daily.   Magnesium Malate Powd (1250mg  tabs) 1 tablet by mouth two times a day   Manganese 50 MG Tabs Take 50 mg by mouth.   montelukast 10 MG tablet Commonly known as:  SINGULAIR 1 daily   NASACORT ALLERGY 24HR NA Place 1 spray into the nose.   OMEGA 3 PO Take one tablet daily   pantoprazole 40 MG tablet Commonly known as:  PROTONIX Take 40 mg by mouth daily.   TART CHERRY ADVANCED PO Take by mouth.   thiamine 100 MG tablet Take 100 mg by mouth daily.   TURMERIC PO Take by mouth daily.   Vitamin B-12 1000 MCG Subl Place 1 tablet under the tongue daily. Contains Folic Acid, B6, and Biotin   vitamin C 500 MG tablet Commonly known as:  ASCORBIC ACID Take 500 mg by mouth daily.   Vitamin D3 400 units tablet Take 400 Units by mouth 2 (two) times daily with a meal.       Review of systems negative except as noted in HPI / PMHx or noted below:  Review of Systems  Constitutional: Negative.   HENT: Negative.   Eyes: Negative.   Respiratory: Negative.   Cardiovascular: Negative.   Gastrointestinal: Negative.   Genitourinary: Negative.   Musculoskeletal: Negative.   Skin: Negative.   Neurological: Negative.   Endo/Heme/Allergies: Negative.   Psychiatric/Behavioral: Negative.     Family History  Problem Relation Age of Onset  . Heart disease Father        CHF  . Allergic  rhinitis Father   . Asthma Father   . Allergic rhinitis Sister   . Asthma Sister   . Allergic rhinitis Brother   . Asthma Brother   . Allergic rhinitis Maternal Aunt   . Asthma Maternal Aunt   . Allergic rhinitis Paternal Aunt   . Asthma Paternal Aunt   . Cancer Neg Hx        No FH of Colon Cancer  . Angioedema Neg Hx   . Atopy Neg Hx   . Immunodeficiency Neg Hx   . Urticaria Neg Hx   . Eczema Neg Hx     Social History   Social History  . Marital status: Widowed    Spouse name: N/A  . Number  of children: 3  . Years of education: N/A   Occupational History  .  Retired    Retired Stage manager)   Social History Main Topics  . Smoking status: Never Smoker  . Smokeless tobacco: Never Used  . Alcohol use No  . Drug use: No  . Sexual activity: No   Other Topics Concern  . Not on file   Social History Narrative   Lives alone (widowed).   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      Does not have living will.  Does desire CPR.  Does not want life support for prolonged periods of time if futile.    Environmental and Social history  Lives in a house with a damp environment, carpeting on a slab, no animals located inside the household, carpeting in the bedroom, plastic on the bed and pillow, and no smoking ongoing with inside the household  Objective:   Vitals:   01/30/17 0927  BP: 138/64  Pulse: 70  Resp: 16  Temp: 97.8 F (36.6 C)   Height: 5' 1.5" (156.2 cm) Weight: 142 lb (64.4 kg)  Physical Exam  Constitutional: She is well-developed, well-nourished, and in no distress.  Allergic shiners  HENT:  Head: Normocephalic. Head is without right periorbital erythema and without left periorbital erythema.  Right Ear: Tympanic membrane, external ear and ear canal normal.  Left Ear: Tympanic membrane, external ear and ear canal normal.  Nose: Mucosal edema present. No rhinorrhea.  Mouth/Throat: Uvula is midline, oropharynx is clear and moist and mucous  membranes are normal. No oropharyngeal exudate.  Eyes: Conjunctivae and lids are normal. Pupils are equal, round, and reactive to light.  Neck: Trachea normal. No tracheal tenderness present. No tracheal deviation present. No thyromegaly present.  Cardiovascular: Normal rate, regular rhythm, S1 normal, S2 normal and normal heart sounds.   No murmur heard. Pulmonary/Chest: Effort normal and breath sounds normal. No stridor. No tachypnea. No respiratory distress. She has no wheezes. She has no rales. She exhibits no tenderness.  Abdominal: Soft. She exhibits no distension and no mass. There is no hepatosplenomegaly. There is no tenderness. There is no rebound and no guarding.  Musculoskeletal: She exhibits no edema or tenderness.  Lymphadenopathy:       Head (right side): No tonsillar adenopathy present.       Head (left side): No tonsillar adenopathy present.    She has no cervical adenopathy.    She has no axillary adenopathy.  Neurological: She is alert. Gait normal.  Skin: No rash noted. She is not diaphoretic. No erythema. No pallor. Nails show no clubbing.  Psychiatric: Mood and affect normal.    Diagnostics: Allergy skin tests were performed. She did not demonstrate any hypersensitivity against a screening panel of aeroallergens or foods.  Spirometry was performed and demonstrated an FEV1 of 1.80 @ 108 % of predicted.  The patient had an Asthma Control Test with the following results:  .    Results of blood tests obtained 11/14/2016 identified a white blood cell count of 6.6 with a normal differential, absolute eosinophil count 100, absolute lymphocyte count 2200, hemoglobin 13.2, platelet 226  Results of a sinus CT scan obtained 01/01/2017 identified the following:  Frontal sinuses:Clear and normal.   Ethmoid sinuses: Single opacified air cell on the right. Otherwiseclear and normal.  Sphenoid sinus:Clear and normal.   Maxillary sinuses:Clear and normal.   Nasal  septum appears midline on this exam.   Ostiomeatal complexes normal an widely  patent bilaterally. SmallHaller cell on the left.   Olfactory grooves are symmetric, 8 mm in depth.   Lamina papyracea are intact.  Anterior ethmoidal artery notches are surrounded by aerated sinuses.   Clival pneumatization is sellar.   Assessment and Plan:    1. Asthma, mild intermittent, well-controlled   2. Other allergic rhinitis   3. LPRD (laryngopharyngeal reflux disease)      1. Use dehumidifier in the house  2. Treat and prevent inflammation:   A. Qnasl 80 - 1-2 sprays each nostril one time per day  B. continue montelukast 10 mg - one tablet one time per day  3. Treat and prevent reflux:   A. eliminate consumption of caffeine and chocolate as much as possible  B. continue pantoprazole 40 mg - one tablet one time per day  C. elevate head of bed  D. no late meals  4. If needed:   A. ProAir HFA 2 puffs every 4-6 hours  B. nasal saline wash  C. OTC antihistamine - Claritin/Zyrtec/Allegra   D. Pataday one drop each eye one time per day   5. Return to clinic in 4 weeks or earlier if problem   It does appear as though Areatha has inflammation of her respiratory tract although atopic disease does not appear to be a significant trigger driving this issue at this point in time. I've given her some anti-inflammatory agents to use her respiratory tract and is well going to have her treat reflux a little more aggressively as there may be a component of LPR contributing to some of her symptoms. She will continue on her proton pump inhibitor but I've asked her to taper off her daily chocolate consumption and to perform a few other behavioral modifications to get this condition under better control. I will regroup with her in 4 weeks or earlier if there is a problem.  Jiles Prows, MD [facility]

## 2017-01-31 ENCOUNTER — Encounter: Payer: Self-pay | Admitting: *Deleted

## 2017-02-01 MED ORDER — OLOPATADINE HCL 0.2 % OP SOLN: 1.0000 [drp] | Freq: Every day | OPHTHALMIC | 3 refills | Status: DC

## 2017-02-01 MED ORDER — PANTOPRAZOLE SODIUM 40 MG PO TBEC: 40.0000 mg | DELAYED_RELEASE_TABLET | Freq: Every day | ORAL | 3 refills | Status: DC

## 2017-02-01 MED ORDER — BECLOMETHASONE DIPROPIONATE 80 MCG/ACT NA AERS: INHALATION_SPRAY | NASAL | 3 refills | Status: DC

## 2017-02-01 MED ORDER — MONTELUKAST SODIUM 10 MG PO TABS
10.0000 mg | ORAL_TABLET | Freq: Every day | ORAL | 3 refills | Status: DC
Start: 2017-02-01 — End: 2017-02-05

## 2017-02-05 ENCOUNTER — Telehealth: Payer: Self-pay | Admitting: Internal Medicine

## 2017-02-05 MED ORDER — MONTELUKAST SODIUM 10 MG PO TABS: 10.0000 mg | ORAL_TABLET | Freq: Every day | ORAL | 0 refills | Status: DC

## 2017-02-05 NOTE — Telephone Encounter (Signed)
Last ov 1.24.18 w/ CY: Patient Instructions  Ok to continue current meds and wait on  Allergy evaluation to see how you do this spring.. You can decide at any time to go for evaluation at an allergy office as we discussed.   Please call if we can help     Called spoke with patient who is requesting a 30 day supply of her Singulair because she is expecting her mail order supply to be late and she only has 3 tablets left.  Advised patient will be happy to do this for her.  Advised pt to please call the office should she need anything further.  Pt voiced her understanding.  Rx sent to verified pharmacy Nothing further needed; will sign off

## 2017-02-25 DIAGNOSIS — N83201 Unspecified ovarian cyst, right side: Secondary | ICD-10-CM | POA: Diagnosis not present

## 2017-02-27 ENCOUNTER — Ambulatory Visit: Payer: Medicare Other | Admitting: Allergy and Immunology

## 2017-02-27 ENCOUNTER — Ambulatory Visit (INDEPENDENT_AMBULATORY_CARE_PROVIDER_SITE_OTHER): Payer: Medicare Other | Admitting: Allergy and Immunology

## 2017-02-27 ENCOUNTER — Encounter: Payer: Self-pay | Admitting: Allergy and Immunology

## 2017-02-27 VITALS — BP 140/74 | HR 64 | Resp 16

## 2017-02-27 DIAGNOSIS — J4520 Mild intermittent asthma, uncomplicated: Secondary | ICD-10-CM | POA: Diagnosis not present

## 2017-02-27 DIAGNOSIS — K219 Gastro-esophageal reflux disease without esophagitis: Secondary | ICD-10-CM | POA: Diagnosis not present

## 2017-02-27 DIAGNOSIS — J3089 Other allergic rhinitis: Secondary | ICD-10-CM

## 2017-02-27 MED ORDER — BECLOMETHASONE DIPROPIONATE 80 MCG/ACT NA AERS: INHALATION_SPRAY | NASAL | 3 refills | Status: DC

## 2017-02-27 NOTE — Progress Notes (Signed)
Follow-up Note  Referring Provider: Lucille Passy, MD Primary Provider: Lucille Passy, MD Date of Office Visit: 02/27/2017  Subjective:   Samantha [name] (DOB: 01-07-1946) is a 71 y.o. female who returns to the [facility] on 02/27/2017 in re-evaluation of the following:  HPI: Samantha [name] returns to this clinic in evaluation of her allergies and asthma and reflux. I last saw her in this clinic on 01/30/2017 at which point in time we attempted to address each of these issues.  She is much better and basically feels as though she has less burning in her chest and less shortness of breath and has no issues with nasal congestion or drainage or throat clearing and overall has had improvement with her medical treatment. She has not had to use a bronchodilator greater than 1 time per week. She has eliminated chocolate and caffeine consumption. She did not develop GI upset with her nasal steroid as she has in the past.  Allergies as of 02/27/2017      Reactions   Cefuroxime Axetil    REACTION: pt not sure   Sulfonamide Derivatives    REACTION: pt not sure      Medication List      albuterol 108 (90 Base) MCG/ACT inhaler Commonly known as:  PROVENTIL HFA Inhale 2 puffs into the lungs every 6 (six) hours as needed.   aspirin EC 81 MG tablet Take by mouth.   Beclomethasone Dipropionate 80 MCG/ACT Aers Commonly known as:  QNASL 1-2 sprays each nostril daily.   bifidobacterium infantis capsule Take 1 capsule by mouth daily.   BLACK CURRANT SEED OIL PO Take by mouth.   cetirizine 10 MG tablet Commonly known as:  ZYRTEC Take 10 mg by mouth daily.   Coenzyme Q-10 60 MG Caps Take 1 capsule by mouth 2 (two) times daily.   GINGER PO Take by mouth.   guaiFENesin 600 MG 12 hr tablet Commonly known as:  MUCINEX Take by mouth 2 (two) times daily as needed. Reported on 03/05/2016   losartan 50 MG tablet Commonly known as:  COZAAR Take 1 tablet (50 mg total) by mouth  daily.   Magnesium Malate Powd (1250mg  tabs) 1 tablet by mouth two times a day   Manganese 50 MG Tabs Take 50 mg by mouth.   montelukast 10 MG tablet Commonly known as:  SINGULAIR Take 1 tablet (10 mg total) by mouth daily.   NASACORT ALLERGY 24HR NA Place 1 spray into the nose.   Olopatadine HCl 0.2 % Soln Commonly known as:  PATADAY Place 1 drop into both eyes daily.   OMEGA 3 PO Take one tablet daily   pantoprazole 40 MG tablet Commonly known as:  PROTONIX Take 1 tablet (40 mg total) by mouth daily.   TART CHERRY ADVANCED PO Take by mouth.   thiamine 100 MG tablet Take 100 mg by mouth daily.   TURMERIC PO Take by mouth daily.   Vitamin B-12 1000 MCG Subl Place 1 tablet under the tongue daily. Contains Folic Acid, B6, and Biotin   vitamin C 500 MG tablet Commonly known as:  ASCORBIC ACID Take 500 mg by mouth daily.   Vitamin D3 400 units tablet Take 400 Units by mouth 2 (two) times daily with a meal.       Past Medical History:  Diagnosis Date  . ALLERGIC RHINITIS 07/26/2008  . ASTHMA 04/14/2007  . Asthma   . ASYMPTOMATIC POSTMENOPAUSAL STATUS 05/10/2008  . CHEST  PAIN 08/26/2008  . COLONIC POLYPS, HX OF 04/14/2007   colonic leiomyoma  . Episcleritis   . FIBROIDS, UTERUS 05/10/2008  . Fibromyalgia   . GERD 04/14/2007  . HYPERCHOLESTEROLEMIA 01/07/2008  . HYPERGLYCEMIA 11/28/2009  . HYPERTENSION 01/28/2008  . INSOMNIA 05/10/2008  . Irritable bowel syndrome 04/06/2010  . OSTEOARTHRITIS 11/28/2009  . OSTEOPOROSIS 04/14/2007  . 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 SINUSITIS 02/10/2009    Past Surgical History:  Procedure Laterality Date  . COLONOSCOPY W/ POLYPECTOMY    . ELECTROCARDIOGRAM  12/04/2006  . LAPAROSCOPY    . NASAL POLYP SURGERY     Polypectomy and septoplasty  . Neck Injury  1983  . Stress Cardiolite  02/13/2002    Review of systems negative except as noted in HPI / PMHx or noted below:  Review of Systems  Constitutional: Negative.   HENT:  Negative.   Eyes: Negative.   Respiratory: Negative.   Cardiovascular: Negative.   Gastrointestinal: Negative.   Genitourinary: Negative.   Musculoskeletal: Negative.   Skin: Negative.   Neurological: Negative.   Endo/Heme/Allergies: Negative.   Psychiatric/Behavioral: Negative.      Objective:   Vitals:   02/27/17 1017  BP: 140/74  Pulse: 64  Resp: 16          Physical Exam  Constitutional: She is well-developed, well-nourished, and in no distress.  HENT:  Head: Normocephalic.  Right Ear: Tympanic membrane, external ear and ear canal normal.  Left Ear: Tympanic membrane, external ear and ear canal normal.  Nose: Nose normal. No mucosal edema or rhinorrhea.  Mouth/Throat: Uvula is midline, oropharynx is clear and moist and mucous membranes are normal. No oropharyngeal exudate.  Eyes: Conjunctivae are normal.  Neck: Trachea normal. No tracheal tenderness present. No tracheal deviation present. No thyromegaly present.  Cardiovascular: Normal rate, regular rhythm, S1 normal, S2 normal and normal heart sounds.   No murmur heard. Pulmonary/Chest: Breath sounds normal. No stridor. No respiratory distress. She has no wheezes. She has no rales.  Musculoskeletal: She exhibits no edema.  Lymphadenopathy:       Head (right side): No tonsillar adenopathy present.       Head (left side): No tonsillar adenopathy present.    She has no cervical adenopathy.  Neurological: She is alert. Gait normal.  Skin: No rash noted. She is not diaphoretic. No erythema. Nails show no clubbing.  Psychiatric: Mood and affect normal.    Diagnostics:    Spirometry was performed and demonstrated an FEV1 of 1.70 at 99 % of predicted.  The patient had an Asthma Control Test with the following results: ACT Total Score: 20.    Assessment and Plan:   1. Asthma, mild intermittent, well-controlled   2. Other allergic rhinitis   3. LPRD (laryngopharyngeal reflux disease)     1. Use dehumidifier in  the house  2. Continue to Treat and prevent inflammation:   A. Qnasl 80 - 1-2 sprays each nostril one time per day  B. continue montelukast 10 mg - one tablet one time per day  3. Continue to Treat and prevent reflux:   A. eliminate consumption of caffeine and chocolate as much as possible  B. continue pantoprazole 40 mg - one tablet one time per day  C. elevate head of bed  D. no late meals  4. If needed:   A. ProAir HFA 2 puffs every 4-6 hours  B. nasal saline wash  C. OTC antihistamine - Claritin/Zyrtec/Allegra   D. Pataday one drop each eye one  time per day   5. Return to clinic in 12 weeks or earlier if problem   6. Obtain fall flu vaccine  Overall Samantha [name] has had a very good response with her current medical therapy which includes anti-inflammatory agents for her respiratory tract and a little bit more attention towards treating her reflux disease. At this point she will continue to use this therapy and I will see her back in this clinic in 12 weeks or earlier if there is a problem. On one additional point, I did have a talk with her today about the need to undergo some type of progressive exercise routine as she really does not perform any exercise to any degree presently  Allena Katz, MD  Allergy / [facility]

## 2017-02-27 NOTE — Patient Instructions (Addendum)
  1. Use dehumidifier in the house  2. Continue to Treat and prevent inflammation:   A. Qnasl 80 - 1-2 sprays each nostril one time per day  B. continue montelukast 10 mg - one tablet one time per day  3. Continue to Treat and prevent reflux:   A. eliminate consumption of caffeine and chocolate as much as possible  B. continue pantoprazole 40 mg - one tablet one time per day  C. elevate head of bed  D. no late meals  4. If needed:   A. ProAir HFA 2 puffs every 4-6 hours  B. nasal saline wash  C. OTC antihistamine - Claritin/Zyrtec/Allegra   D. Pataday one drop each eye one time per day   5. Return to clinic in 12 weeks or earlier if problem   6. Obtain fall flu vaccine

## 2017-03-04 DIAGNOSIS — R109 Unspecified abdominal pain: Secondary | ICD-10-CM | POA: Diagnosis not present

## 2017-03-04 DIAGNOSIS — K219 Gastro-esophageal reflux disease without esophagitis: Secondary | ICD-10-CM | POA: Diagnosis not present

## 2017-03-04 DIAGNOSIS — K649 Unspecified hemorrhoids: Secondary | ICD-10-CM | POA: Diagnosis not present

## 2017-03-04 DIAGNOSIS — R1013 Epigastric pain: Secondary | ICD-10-CM | POA: Diagnosis not present

## 2017-03-04 DIAGNOSIS — Z86010 Personal history of colonic polyps: Secondary | ICD-10-CM | POA: Diagnosis not present

## 2017-04-08 ENCOUNTER — Ambulatory Visit: Payer: Medicare Other | Admitting: Internal Medicine

## 2017-04-22 ENCOUNTER — Ambulatory Visit (INDEPENDENT_AMBULATORY_CARE_PROVIDER_SITE_OTHER): Payer: Medicare Other | Admitting: Family Medicine

## 2017-04-22 ENCOUNTER — Encounter: Payer: Self-pay | Admitting: Family Medicine

## 2017-04-22 DIAGNOSIS — J069 Acute upper respiratory infection, unspecified: Secondary | ICD-10-CM | POA: Diagnosis not present

## 2017-04-22 NOTE — Progress Notes (Signed)
Taking OTC tylenol cold and flu.    Sick for about 5 days.  ST.  Voice is hoarse.  Throat is irritated.  No fevers.  No vomiting no diarrhea.  Some R>L ear pain.  Cough.  Some sputum prev, less now.  Discolored rhinorrhea, post nasal gtt.  Still using SABA.  Some improvement overall in the last few days.  SABA helps some with the cough.  Fatigue noted.   Meds, vitals, and allergies reviewed.   ROS: Per HPI unless specifically indicated in ROS section   GEN: nad, alert and oriented HEENT: mucous membranes moist, tm w/o erythema, nasal exam w/o erythema, clear discharge noted,  OP with cobblestoning, R max sinus slightly ttp, voice is slightly hoarse NECK: supple w/o LA CV: rrr.   PULM: ctab, no inc wob EXT: no edema SKIN: no acute rash

## 2017-04-22 NOTE — Patient Instructions (Signed)
Use the albuterol inhaler as needed.   Rest your voice in the meantime.  Rest and fluids.  Gargle with salt water.  Take care.  Glad to see you.

## 2017-04-22 NOTE — Assessment & Plan Note (Signed)
Likely viral, nontoxic, some improvement in the last few days.  Use the albuterol inhaler as needed.   Rest voice in the meantime.  Rest and fluids.  Gargle with salt water.  Update [place] as needed.  She agrees.

## 2017-04-26 ENCOUNTER — Telehealth: Payer: Self-pay

## 2017-04-26 MED ORDER — AZITHROMYCIN 250 MG PO TABS: ORAL_TABLET | ORAL | 0 refills | Status: DC

## 2017-04-26 NOTE — Telephone Encounter (Signed)
Pt was seen 04/22/17 and is not any better; still has a lot of head congestion, prod cough with thick white phlegm,SOB but inhaler is helping that. No fever, wheezing or ST. Pt thinks needs medication to [place].Please advise.

## 2017-04-26 NOTE — Telephone Encounter (Signed)
Given the duration would add on zithromax and have her f/u with PCP if not better.  Thanks.  rx sent.

## 2017-04-26 NOTE — Telephone Encounter (Signed)
Patient advised.

## 2017-05-06 ENCOUNTER — Ambulatory Visit (INDEPENDENT_AMBULATORY_CARE_PROVIDER_SITE_OTHER): Payer: Medicare Other | Admitting: Family Medicine

## 2017-05-06 ENCOUNTER — Telehealth: Payer: Self-pay | Admitting: *Deleted

## 2017-05-06 ENCOUNTER — Encounter: Payer: Self-pay | Admitting: Family Medicine

## 2017-05-06 VITALS — BP 130/76 | HR 73 | Temp 98.1°F | Resp 18 | Ht 61.5 in | Wt 143.0 lb

## 2017-05-06 DIAGNOSIS — I959 Hypotension, unspecified: Secondary | ICD-10-CM

## 2017-05-06 DIAGNOSIS — J069 Acute upper respiratory infection, unspecified: Secondary | ICD-10-CM | POA: Diagnosis not present

## 2017-05-06 DIAGNOSIS — J4520 Mild intermittent asthma, uncomplicated: Secondary | ICD-10-CM | POA: Diagnosis not present

## 2017-05-06 MED ORDER — PREDNISONE 20 MG PO TABS: 20.0000 mg | ORAL_TABLET | Freq: Every day | ORAL | 0 refills | Status: DC

## 2017-05-06 MED ORDER — DOXYCYCLINE HYCLATE 100 MG PO TABS: ORAL_TABLET | ORAL | 0 refills | Status: DC

## 2017-05-06 MED ORDER — LOSARTAN POTASSIUM 50 MG PO TABS: 50.0000 mg | ORAL_TABLET | Freq: Every day | ORAL | 3 refills | Status: DC

## 2017-05-06 NOTE — Progress Notes (Signed)
Subjective:   Patient ID: Samantha [name], female    DOB: 1945-10-08, 71 y.o.   MRN: [id number]  Samantha [name] is a pleasant 71 y.o. year old female who presents to clinic today with Acute Visit (head congestion continues)  on 05/06/2017  HPI:  Cough, nasal congestion, sinus pressure for months.  Now worsening cough, SOB for past few days.  Saw Dr. Damita Dunnings on 04/22/17 for these symptoms. Note reviewed. Felt likely viral, advised supportive care.  She called back on 04/26/17 stating that symptoms were not improving. He called in zithromax and advised that she follow up with me today.  She feels symptoms have progressed- cough has worsened as has the fatigue.  Cough is also now more productive.  No wheezing. Using her inhalers.  Saw her pulmonologist, Dr. Neldon Mc on 02/27/17. Note reviewed. No medication changed or images done.   Current Outpatient Prescriptions on File Prior to Visit  Medication Sig Dispense Refill  . albuterol (PROVENTIL HFA) 108 (90 Base) MCG/ACT inhaler Inhale 2 puffs into the lungs every 6 (six) hours as needed. 3 Inhaler 3  . Ascorbic Acid (VITAMIN C) 500 MG tablet Take 500 mg by mouth daily.      Marland Kitchen aspirin EC 81 MG tablet Take by mouth.    Marland Kitchen azithromycin (ZITHROMAX) 250 MG tablet 2 tabs a day for 1 day and then 1 a day for 4 days. 6 each 0  . Beclomethasone Dipropionate (QNASL) 80 MCG/ACT AERS 1-2 sprays each nostril daily. 3 Inhaler 3  . bifidobacterium infantis (ALIGN) capsule Take 1 capsule by mouth daily.      Marland Kitchen BLACK CURRANT SEED OIL PO Take by mouth.    . cetirizine (ZYRTEC) 10 MG tablet Take 10 mg by mouth daily.    . Cholecalciferol (VITAMIN D3) 400 UNITS tablet Take 400 Units by mouth 2 (two) times daily with a meal.     . Coenzyme Q-10 60 MG CAPS Take 1 capsule by mouth 2 (two) times daily.     . Cyanocobalamin (VITAMIN B-12) 1000 MCG SUBL Place 1 tablet under the tongue daily. Contains Folic Acid, B6, and Biotin    . Ginger, Zingiber officinalis,  (GINGER PO) Take by mouth.    Marland Kitchen guaiFENesin (MUCINEX) 600 MG 12 hr tablet Take by mouth 2 (two) times daily as needed. Reported on 03/05/2016    . Magnesium Malate POWD (1250mg  tabs) 1 tablet by mouth two times a day    . Manganese 50 MG TABS Take 50 mg by mouth.    . Misc Natural Products (TART CHERRY ADVANCED PO) Take by mouth.    . montelukast (SINGULAIR) 10 MG tablet Take 1 tablet (10 mg total) by mouth daily. 30 tablet 0  . Olopatadine HCl (PATADAY) 0.2 % SOLN Place 1 drop into both eyes daily. 1 Bottle 3  . Omega-3 Fatty Acids (OMEGA 3 PO) Take one tablet daily    . pantoprazole (PROTONIX) 40 MG tablet Take 1 tablet (40 mg total) by mouth daily. 30 tablet 3  . thiamine 100 MG tablet Take 100 mg by mouth daily.      . TURMERIC PO Take by mouth daily.     No current facility-administered medications on file prior to visit.     Allergies  Allergen Reactions  . Cefuroxime Axetil     REACTION: pt not sure  . Sulfonamide Derivatives     REACTION: pt not sure    Past Medical History:  Diagnosis Date  .  ALLERGIC RHINITIS 07/26/2008  . ASTHMA 04/14/2007  . Asthma   . ASYMPTOMATIC POSTMENOPAUSAL STATUS 05/10/2008  . CHEST PAIN 08/26/2008  . COLONIC POLYPS, HX OF 04/14/2007   colonic leiomyoma  . Episcleritis   . FIBROIDS, UTERUS 05/10/2008  . Fibromyalgia   . GERD 04/14/2007  . HYPERCHOLESTEROLEMIA 01/07/2008  . HYPERGLYCEMIA 11/28/2009  . HYPERTENSION 01/28/2008  . INSOMNIA 05/10/2008  . Irritable bowel syndrome 04/06/2010  . OSTEOARTHRITIS 11/28/2009  . OSTEOPOROSIS 04/14/2007  . 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 SINUSITIS 02/10/2009    Past Surgical History:  Procedure Laterality Date  . COLONOSCOPY W/ POLYPECTOMY    . ELECTROCARDIOGRAM  12/04/2006  . LAPAROSCOPY    . NASAL POLYP SURGERY     Polypectomy and septoplasty  . Neck Injury  1983  . Stress Cardiolite  02/13/2002    Family History  Problem Relation Age of Onset  . Heart disease Father        CHF  . Allergic rhinitis  Father   . Asthma Father   . Allergic rhinitis Sister   . Asthma Sister   . Allergic rhinitis Brother   . Asthma Brother   . Allergic rhinitis Maternal Aunt   . Asthma Maternal Aunt   . Allergic rhinitis Paternal Aunt   . Asthma Paternal Aunt   . Cancer Neg Hx        No FH of Colon Cancer  . Angioedema Neg Hx   . Atopy Neg Hx   . Immunodeficiency Neg Hx   . Urticaria Neg Hx   . Eczema Neg Hx     Social History   Social History  . Marital status: Widowed    Spouse name: N/A  . Number of children: 3  . Years of education: N/A   Occupational History  .  Retired    Retired Stage manager)   Social History Main Topics  . Smoking status: Never Smoker  . Smokeless tobacco: Never Used  . Alcohol use No  . Drug use: No  . Sexual activity: No   Other Topics Concern  . Not on file   Social History Narrative   Lives alone (widowed).   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      Does not have living will.  Does desire CPR.  Does not want life support for prolonged periods of time if futile.   The PMH, PSH, Social History, Family History, Medications, and allergies have been reviewed in [facility], and have been updated if relevant.   Review of Systems  HENT: Negative.   Eyes: Negative.   Respiratory: Positive for cough and shortness of breath. Negative for apnea, choking, chest tightness, wheezing and stridor.   Cardiovascular: Negative.  Negative for chest pain, palpitations and leg swelling.  Gastrointestinal: Negative.   Endocrine: Negative.   Genitourinary: Negative.   Musculoskeletal: Negative.   Skin: Negative.   Neurological: Negative.   Hematological: Negative.   Psychiatric/Behavioral: Negative.   All other systems reviewed and are negative.      Objective:    BP 130/76   Pulse 73   Temp 98.1 F (36.7 C)   Resp 18   Ht 5' 1.5" (1.562 m)   Wt 143 lb (64.9 kg)   SpO2 98%   BMI 26.58 kg/m    Physical Exam  Constitutional: She is oriented to  person, place, and time. She appears well-developed and well-nourished. No distress.  HENT:  Head: Normocephalic and atraumatic.  Right Ear: Hearing normal.  Left Ear: Hearing and tympanic membrane normal.  Nose: Mucosal edema present.  +PND  Eyes: Conjunctivae are normal.  Cardiovascular: Normal rate and regular rhythm.   Pulmonary/Chest: Effort normal. She has decreased breath sounds. She has no wheezes. She has no rhonchi. She has no rales.  Musculoskeletal: She exhibits no edema.  Neurological: She is alert and oriented to person, place, and time. No cranial nerve deficit.  Skin: Skin is warm and dry. She is not diaphoretic.  Psychiatric: She has a normal mood and affect. Her behavior is normal. Judgment and thought content normal.  Nursing note and vitals reviewed.         Assessment & Plan:   Acute upper respiratory infection  Mild intermittent asthma, unspecified whether complicated  Hypotension, unspecified hypotension type - Plan: losartan (COZAAR) 50 MG tablet No Follow-up on file.

## 2017-05-06 NOTE — Assessment & Plan Note (Signed)
Persistent. Given duration and progression of symptoms, will treat with course of prednisone and doxycyline. Continue inhalers as directed. Call or return to clinic prn if these symptoms worsen or fail to improve as anticipated. The patient indicates understanding of these issues and agrees with the plan.

## 2017-05-06 NOTE — Telephone Encounter (Signed)
Patient left a voicemail stating that she saw you earlier today and is confused about her medications. Patient stated that she thought that you told her she was to take Prednisone 7 days and they gave her 14 days of medication. Patient stated that she wants you to confirm also the length of time she is to take the antibiotic also. Patient stated that she wants to make sure that they filled her scripts right?

## 2017-05-06 NOTE — Patient Instructions (Signed)
Great to see you.  Please take doxycycline as directed- Prednisone 20 mg daily for 7 days.

## 2017-05-07 NOTE — Telephone Encounter (Signed)
I'm glad to hear it.  Thanks for the update.

## 2017-05-07 NOTE — Telephone Encounter (Signed)
Spoke with patient this Am & instructed that she should take prednisone 20 mg daily x 7 days.  I intended for her to have some remaining in case she has another flare.  ** PATIENT STATED SHE IS FEELING MUCH BETTER SINCE SHE TOOK THE PREDNISONE**

## 2017-05-07 NOTE — Telephone Encounter (Signed)
Yes she should take prednisone 20 mg daily x 7 days.  I intended for her to have some remaining in case she has another flare that we could manage on the phone.

## 2017-05-14 ENCOUNTER — Telehealth: Payer: Self-pay | Admitting: Family Medicine

## 2017-05-14 NOTE — Telephone Encounter (Signed)
Best number  [phone]  Pt called to schedule cpx with dr Deborra Medina She wants to stay here at [place] her last cpx 07/04/17

## 2017-05-22 ENCOUNTER — Ambulatory Visit: Payer: Medicare Other | Admitting: Allergy and Immunology

## 2017-05-23 ENCOUNTER — Telehealth: Payer: Self-pay | Admitting: Internal Medicine

## 2017-05-23 MED ORDER — MONTELUKAST SODIUM 10 MG PO TABS: 10.0000 mg | ORAL_TABLET | Freq: Every day | ORAL | 0 refills | Status: DC

## 2017-05-23 NOTE — Telephone Encounter (Signed)
Spoke with pt, advised her that I would send in a 90 day supply of Singular but she missed her appt recently and will have to RTC to have more than 90 days. Pt agreed and will follow up before Rx runs out. Nothing further is needed.

## 2017-05-28 ENCOUNTER — Ambulatory Visit (INDEPENDENT_AMBULATORY_CARE_PROVIDER_SITE_OTHER): Payer: Medicare Other | Admitting: Allergy and Immunology

## 2017-05-28 ENCOUNTER — Encounter: Payer: Self-pay | Admitting: Allergy and Immunology

## 2017-05-28 VITALS — BP 140/64 | HR 64 | Resp 16

## 2017-05-28 DIAGNOSIS — J4530 Mild persistent asthma, uncomplicated: Secondary | ICD-10-CM | POA: Diagnosis not present

## 2017-05-28 DIAGNOSIS — J3089 Other allergic rhinitis: Secondary | ICD-10-CM

## 2017-05-28 DIAGNOSIS — K219 Gastro-esophageal reflux disease without esophagitis: Secondary | ICD-10-CM

## 2017-05-28 MED ORDER — FLUTICASONE FUROATE 100 MCG/ACT IN AEPB: 1.0000 | INHALATION_SPRAY | Freq: Every day | RESPIRATORY_TRACT | 2 refills | Status: DC

## 2017-05-28 NOTE — Progress Notes (Signed)
Follow-up Note  Referring Provider: Lucille Passy, MD Primary Provider: Lucille Passy, MD Date of Office Visit: 05/28/2017  Subjective:   Samantha [name] (DOB: Apr 02, 1946) is a 71 y.o. female who returns to the [facility] on 05/28/2017 in re-evaluation of the following:  HPI: [name] returns to this clinic in reevaluation of her allergies and asthma and reflux. Her last visit to this clinic was 02/27/2017. At that point she was doing relatively well.  Unfortunately, she became "sick" with nasal congestion and laryngitis and then coughing for which she went to her primary care doctor and was treated with azithromycin and then subsequently another antibiotic and prednisone which she just finished 7 days ago. Ever since that point in time she has had some continued cough and she has had to use her bronchodilator almost every day. She has improved with that therapy but still has these lingering symptoms. She never had any fever or ugly sputum production or ugly nasal discharge or chest pain or headaches.  She believes that her reflux is under very good control at this point in time and she does not consume any chocolate or caffeine.  Allergies as of 05/28/2017      Reactions   Cefuroxime Axetil    REACTION: pt not sure   Sulfonamide Derivatives    REACTION: pt not sure      Medication List      albuterol 108 (90 Base) MCG/ACT inhaler Commonly known as:  PROVENTIL HFA Inhale 2 puffs into the lungs every 6 (six) hours as needed.   Beclomethasone Dipropionate 80 MCG/ACT Aers Commonly known as:  QNASL 1-2 sprays each nostril daily.   bifidobacterium infantis capsule Take 1 capsule by mouth daily.   BLACK CURRANT SEED OIL PO Take by mouth.   cetirizine 10 MG tablet Commonly known as:  ZYRTEC Take 10 mg by mouth daily.   Coenzyme Q-10 60 MG Caps Take 1 capsule by mouth 2 (two) times daily.   Fluticasone Furoate 100 MCG/ACT Aepb Commonly known as:  ARNUITY  ELLIPTA Inhale 1 puff into the lungs daily.   GINGER PO Take by mouth.   guaiFENesin 600 MG 12 hr tablet Commonly known as:  MUCINEX Take by mouth 2 (two) times daily as needed. Reported on 03/05/2016   losartan 50 MG tablet Commonly known as:  COZAAR Take 1 tablet (50 mg total) by mouth daily.   Magnesium Malate Powd (1250mg  tabs) 1 tablet by mouth two times a day   Manganese 50 MG Tabs Take 50 mg by mouth.   montelukast 10 MG tablet Commonly known as:  SINGULAIR 1 daily   Olopatadine HCl 0.2 % Soln Commonly known as:  PATADAY Place 1 drop into both eyes daily.   OMEGA 3 PO Take one tablet daily   pantoprazole 40 MG tablet Commonly known as:  PROTONIX Take 1 tablet (40 mg total) by mouth daily.   TART CHERRY ADVANCED PO Take by mouth.   thiamine 100 MG tablet Take 100 mg by mouth daily.   TURMERIC PO Take by mouth daily.   Vitamin B-12 1000 MCG Subl Place 1 tablet under the tongue daily. Contains Folic Acid, B6, and Biotin   vitamin C 500 MG tablet Commonly known as:  ASCORBIC ACID Take 500 mg by mouth daily.      Past Medical History:  Diagnosis Date  . ALLERGIC RHINITIS 07/26/2008  . ASTHMA 04/14/2007  . Asthma   . ASYMPTOMATIC POSTMENOPAUSAL STATUS  05/10/2008  . CHEST PAIN 08/26/2008  . COLONIC POLYPS, HX OF 04/14/2007   colonic leiomyoma  . Episcleritis   . FIBROIDS, UTERUS 05/10/2008  . Fibromyalgia   . GERD 04/14/2007  . HYPERCHOLESTEROLEMIA 01/07/2008  . HYPERGLYCEMIA 11/28/2009  . HYPERTENSION 01/28/2008  . INSOMNIA 05/10/2008  . Irritable bowel syndrome 04/06/2010  . OSTEOARTHRITIS 11/28/2009  . OSTEOPOROSIS 04/14/2007  . 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 SINUSITIS 02/10/2009    Past Surgical History:  Procedure Laterality Date  . COLONOSCOPY W/ POLYPECTOMY    . ELECTROCARDIOGRAM  12/04/2006  . LAPAROSCOPY    . NASAL POLYP SURGERY     Polypectomy and septoplasty  . Neck Injury  1983  . Stress Cardiolite  02/13/2002    Review of systems  negative except as noted in HPI / PMHx or noted below:  Review of Systems  Constitutional: Negative.   HENT: Negative.   Eyes: Negative.   Respiratory: Negative.   Cardiovascular: Negative.   Gastrointestinal: Negative.   Genitourinary: Negative.   Musculoskeletal: Negative.   Skin: Negative.   Neurological: Negative.   Endo/Heme/Allergies: Negative.   Psychiatric/Behavioral: Negative.      Objective:   Vitals:   05/28/17 1005  BP: 140/64  Pulse: 64  Resp: 16          Physical Exam  Constitutional: She is well-developed, well-nourished, and in no distress.  HENT:  Head: Normocephalic.  Right Ear: Tympanic membrane, external ear and ear canal normal.  Left Ear: Tympanic membrane, external ear and ear canal normal.  Nose: Nose normal. No mucosal edema or rhinorrhea.  Mouth/Throat: Uvula is midline, oropharynx is clear and moist and mucous membranes are normal. No oropharyngeal exudate.  Eyes: Conjunctivae are normal.  Neck: Trachea normal. No tracheal tenderness present. No tracheal deviation present. No thyromegaly present.  Cardiovascular: Normal rate, regular rhythm, S1 normal, S2 normal and normal heart sounds.   No murmur heard. Pulmonary/Chest: Breath sounds normal. No stridor. No respiratory distress. She has no wheezes. She has no rales.  Musculoskeletal: She exhibits no edema.  Lymphadenopathy:       Head (right side): No tonsillar adenopathy present.       Head (left side): No tonsillar adenopathy present.    She has no cervical adenopathy.  Neurological: She is alert. Gait normal.  Skin: No rash noted. She is not diaphoretic. No erythema. Nails show no clubbing.  Psychiatric: Mood and affect normal.    Diagnostics:    Spirometry was performed and demonstrated an FEV1 of 1.78 at 110 % of predicted.  The patient had an Asthma Control Test with the following results: ACT Total Score: 11.    Assessment and Plan:   1. Not well controlled mild persistent  asthma   2. Other allergic rhinitis   3. LPRD (laryngopharyngeal reflux disease)     1. Continue to Treat and prevent inflammation:   A. Qnasl 80 - 1-2 sprays each nostril one time per day  B. continue montelukast 10 mg - one tablet one time per day  C. Start Arnuity 100 one inhalation one time per day  3. Continue to Treat and prevent reflux:   A. eliminate consumption of caffeine and chocolate as much as possible  B. continue pantoprazole 40 mg - one tablet one time per day  C. elevate head of bed  D. no late meals  4. If needed:   A. ProAir HFA 2 puffs every 4-6 hours  B. nasal saline wash  C. OTC antihistamine -  Claritin/Zyrtec/Allegra   D. Pataday one drop each eye one time per day   5. "Action Plan" for flare up:   A. Increase Arnuity to twice a day  B. Use Proair Hfa if needed  5. Return to clinic in December 2018 or earlier if problem   6. Obtain fall flu vaccine  I am going to introduce an inhaled steroid utilized on a regular basis regarding Ayris's anti-inflammatory therapy for her respiratory tract. I have also given her a action plan to initiate if she develops a another flare up in the future. She will continue on other forms of anti-inflammatory medications for her respiratory tract as noted above and will also continue to aggressively treat reflux as noted above. I will see her back in this clinic in December 2018 or earlier if there is a problem.  Allena Katz, MD [facility]ergy / [facility]

## 2017-05-28 NOTE — Patient Instructions (Signed)
  1. Continue to Treat and prevent inflammation:   A. Qnasl 80 - 1-2 sprays each nostril one time per day  B. continue montelukast 10 mg - one tablet one time per day  C. Start Arnuity 100 one inhalation one time per day  3. Continue to Treat and prevent reflux:   A. eliminate consumption of caffeine and chocolate as much as possible  B. continue pantoprazole 40 mg - one tablet one time per day  C. elevate head of bed  D. no late meals  4. If needed:   A. ProAir HFA 2 puffs every 4-6 hours  B. nasal saline wash  C. OTC antihistamine - Claritin/Zyrtec/Allegra   D. Pataday one drop each eye one time per day   5. "Action Plan" for flare up:   A. Increase Arnuity to twice a day  B. Use Proair Hfa if needed  5. Return to clinic in December 2018 or earlier if problem   6. Obtain fall flu vaccine

## 2017-06-03 ENCOUNTER — Telehealth: Payer: Self-pay | Admitting: Allergy and Immunology

## 2017-06-03 DIAGNOSIS — R1909 Other intra-abdominal and pelvic swelling, mass and lump: Secondary | ICD-10-CM | POA: Diagnosis not present

## 2017-06-03 DIAGNOSIS — N83201 Unspecified ovarian cyst, right side: Secondary | ICD-10-CM | POA: Diagnosis not present

## 2017-06-03 MED ORDER — MOMETASONE FUROATE 220 MCG/INH IN AEPB: 1.0000 | INHALATION_SPRAY | Freq: Every day | RESPIRATORY_TRACT | 5 refills | Status: DC

## 2017-06-03 NOTE — Telephone Encounter (Signed)
Sent script

## 2017-06-03 NOTE — Telephone Encounter (Signed)
This prescription was sent 05-28-17 to [facility]

## 2017-06-03 NOTE — Telephone Encounter (Signed)
Called patient. Left message informing patient that we sent in the Asmanex  1 puff once daily. If she has any questions or concerns to give [place] a call.

## 2017-06-03 NOTE — Telephone Encounter (Signed)
Pt came by to tell us we sent the [place] 100 to the wrong pharmacy and needs it sent to [facility]. [phone].

## 2017-06-03 NOTE — Telephone Encounter (Signed)
Please see if [facility] will prescribe Asmanex 220 twice haler one inhalation 1 time per day and let patient know

## 2017-06-03 NOTE — Telephone Encounter (Signed)
Patient called back and told me [facility] keeps calling her to tell her they have this prescription. She did not want it to be sent there. I told her that all I saw on her medication was the [facility] and advised her to call them. It shows it was received by [facility] on 05-28-17.

## 2017-06-03 NOTE — Telephone Encounter (Signed)
I called the [facility] and spoke with Constance Haw. Constance Haw stated that they do not carry the Arnuity. What other inhaler would you like to use? Please advise

## 2017-06-05 DIAGNOSIS — Z23 Encounter for immunization: Secondary | ICD-10-CM | POA: Diagnosis not present

## 2017-06-13 ENCOUNTER — Telehealth: Payer: Self-pay | Admitting: Allergy and Immunology

## 2017-06-13 NOTE — Telephone Encounter (Signed)
Called and left message for patient to call back with name of alternative

## 2017-06-13 NOTE — Telephone Encounter (Signed)
Patient said Dr. Neldon Mc prescribed Arnuity Ellipta for her and she got a letter from [facility] that said they will not fill this and that she needed an alternative. She would like to talk to a nurse to explain what the alternative will be. [facility] by mail.

## 2017-06-13 NOTE — Telephone Encounter (Signed)
Please disregard the message regarding the Flovent. Asmanex was sent 06-03-17 by J. Lipford. I have clarified all issues and concerns with the patient.

## 2017-06-13 NOTE — Telephone Encounter (Signed)
Patient called back and advised that medication alternative is Flovent, but medication class is not the same. Please advise and thank you.

## 2017-06-19 ENCOUNTER — Encounter: Payer: Self-pay | Admitting: Family Medicine

## 2017-06-19 ENCOUNTER — Telehealth: Payer: Self-pay | Admitting: *Deleted

## 2017-06-19 ENCOUNTER — Ambulatory Visit (INDEPENDENT_AMBULATORY_CARE_PROVIDER_SITE_OTHER): Payer: Medicare Other | Admitting: Family Medicine

## 2017-06-19 DIAGNOSIS — M4698 Unspecified inflammatory spondylopathy, sacral and sacrococcygeal region: Secondary | ICD-10-CM

## 2017-06-19 DIAGNOSIS — M47818 Spondylosis without myelopathy or radiculopathy, sacral and sacrococcygeal region: Secondary | ICD-10-CM

## 2017-06-19 NOTE — Telephone Encounter (Signed)
Attempted to contact the patient regarding a follow up referral from Dr. Radene Knee. LMOM with the follow up appt date and time of November 5th at 2pm.

## 2017-06-19 NOTE — Progress Notes (Signed)
Subjective:   Patient ID: Samantha [name], female    DOB: 1946-08-22, 71 y.o.   MRN: [id number]  Samantha [name] is a pleasant 71 y.o. year old female who presents to clinic today with Pain (Patient states that she has been having pain on right inner upper thigh, outer right lower leg , and the right back side of hip and occas in the front lower hip.  Funny feeling in toes)  on 06/19/2017  HPI:  Right hip pain- travels down her outer right leg and sometimes into her toes. Ongoing for several days. No known injury but she has been taking a senior exercise class for strength and balance.   Current Outpatient Prescriptions on File Prior to Visit  Medication Sig Dispense Refill  . albuterol (PROVENTIL HFA) 108 (90 Base) MCG/ACT inhaler Inhale 2 puffs into the lungs every 6 (six) hours as needed. 3 Inhaler 3  . Ascorbic Acid (VITAMIN C) 500 MG tablet Take 500 mg by mouth daily.      . Beclomethasone Dipropionate (QNASL) 80 MCG/ACT AERS 1-2 sprays each nostril daily. 3 Inhaler 3  . bifidobacterium infantis (ALIGN) capsule Take 1 capsule by mouth daily.      Marland Kitchen BLACK CURRANT SEED OIL PO Take by mouth.    . cetirizine (ZYRTEC) 10 MG tablet Take 10 mg by mouth daily.    . Cholecalciferol (VITAMIN D3) 400 UNITS tablet Take 400 Units by mouth 2 (two) times daily with a meal.     . Coenzyme Q-10 60 MG CAPS Take 1 capsule by mouth 2 (two) times daily.     . Cyanocobalamin (VITAMIN B-12) 1000 MCG SUBL Place 1 tablet under the tongue daily. Contains Folic Acid, B6, and Biotin    . Fluticasone Furoate (ARNUITY ELLIPTA) 100 MCG/ACT AEPB Inhale 1 puff into the lungs daily. 90 each 2  . Ginger, Zingiber officinalis, (GINGER PO) Take by mouth.    . losartan (COZAAR) 50 MG tablet Take 1 tablet (50 mg total) by mouth daily. 90 tablet 3  . Magnesium Malate POWD (1250mg  tabs) 1 tablet by mouth two times a day    . Manganese 50 MG TABS Take 50 mg by mouth.    . Misc Natural Products (TART CHERRY ADVANCED PO) Take  by mouth.    . mometasone (ASMANEX) 220 MCG/INH inhaler Inhale 1 puff into the lungs daily. 1 Inhaler 5  . montelukast (SINGULAIR) 10 MG tablet 1 daily    . Olopatadine HCl (PATADAY) 0.2 % SOLN Place 1 drop into both eyes daily. 1 Bottle 3  . Omega-3 Fatty Acids (OMEGA 3 PO) Take one tablet daily    . pantoprazole (PROTONIX) 40 MG tablet Take 1 tablet (40 mg total) by mouth daily. 30 tablet 3  . thiamine 100 MG tablet Take 100 mg by mouth daily.      . TURMERIC PO Take by mouth daily.     No current facility-administered medications on file prior to visit.     Allergies  Allergen Reactions  . Cefuroxime Axetil     REACTION: pt not sure  . Sulfonamide Derivatives     REACTION: pt not sure    Past Medical History:  Diagnosis Date  . ALLERGIC RHINITIS 07/26/2008  . ASTHMA 04/14/2007  . Asthma   . ASYMPTOMATIC POSTMENOPAUSAL STATUS 05/10/2008  . CHEST PAIN 08/26/2008  . COLONIC POLYPS, HX OF 04/14/2007   colonic leiomyoma  . Episcleritis   . FIBROIDS, UTERUS 05/10/2008  . Fibromyalgia   .  GERD 04/14/2007  . HYPERCHOLESTEROLEMIA 01/07/2008  . HYPERGLYCEMIA 11/28/2009  . HYPERTENSION 01/28/2008  . INSOMNIA 05/10/2008  . Irritable bowel syndrome 04/06/2010  . OSTEOARTHRITIS 11/28/2009  . OSTEOPOROSIS 04/14/2007  . 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 SINUSITIS 02/10/2009    Past Surgical History:  Procedure Laterality Date  . COLONOSCOPY W/ POLYPECTOMY    . ELECTROCARDIOGRAM  12/04/2006  . LAPAROSCOPY    . NASAL POLYP SURGERY     Polypectomy and septoplasty  . Neck Injury  1983  . Stress Cardiolite  02/13/2002    Family History  Problem Relation Age of Onset  . Heart disease Father        CHF  . Allergic rhinitis Father   . Asthma Father   . Allergic rhinitis Sister   . Asthma Sister   . Allergic rhinitis Brother   . Asthma Brother   . Allergic rhinitis Maternal Aunt   . Asthma Maternal Aunt   . Allergic rhinitis Paternal Aunt   . Asthma Paternal Aunt   . Cancer Neg Hx         No FH of Colon Cancer  . Angioedema Neg Hx   . Atopy Neg Hx   . Immunodeficiency Neg Hx   . Urticaria Neg Hx   . Eczema Neg Hx     Social History   Social History  . Marital status: Widowed    Spouse name: N/A  . Number of children: 3  . Years of education: N/A   Occupational History  .  Retired    Retired Stage manager)   Social History Main Topics  . Smoking status: Never Smoker  . Smokeless tobacco: Never Used  . Alcohol use No  . Drug use: No  . Sexual activity: No   Other Topics Concern  . Not on file   Social History Narrative   Lives alone (widowed).   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      Does not have living will.  Does desire CPR.  Does not want life support for prolonged periods of time if futile.   The PMH, PSH, Social History, Family History, Medications, and allergies have been reviewed in [facility], and have been updated if relevant.   Review of Systems  Musculoskeletal: Positive for arthralgias. Negative for back pain, gait problem, joint swelling, myalgias, neck pain and neck stiffness.  Neurological: Positive for numbness. Negative for weakness.  All other systems reviewed and are negative.      Objective:    BP 132/80 (BP Location: Left Arm, Patient Position: Sitting, Cuff Size: Normal)   Pulse 69   Temp 98.1 F (36.7 C) (Oral)   Ht 5\' 2"  (1.575 m)   Wt 142 lb (64.4 kg)   SpO2 99%   BMI 25.97 kg/m    Physical Exam  Constitutional: She is oriented to person, place, and time. She appears well-developed and well-nourished. No distress.  HENT:  Head: Normocephalic and atraumatic.  Eyes: Conjunctivae are normal.  Cardiovascular: Normal rate.   Pulmonary/Chest: Effort normal.  Musculoskeletal:  TTP over right SI joint  Neurological: She is alert and oriented to person, place, and time. No cranial nerve deficit.  Skin: Skin is warm and dry. She is not diaphoretic.  Psychiatric: She has a normal mood and affect. Her behavior is  normal. Judgment and thought content normal.  Nursing note and vitals reviewed.         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

## 2017-06-19 NOTE — Assessment & Plan Note (Signed)
New with sciatic nerve impingement. Given exercises from sports med advisor. She is currently taking prednisone for URI symptoms. Call or return to clinic prn if these symptoms worsen or fail to improve as anticipated. The patient indicates understanding of these issues and agrees with the plan.

## 2017-06-26 DIAGNOSIS — H40003 Preglaucoma, unspecified, bilateral: Secondary | ICD-10-CM | POA: Diagnosis not present

## 2017-07-03 ENCOUNTER — Ambulatory Visit (INDEPENDENT_AMBULATORY_CARE_PROVIDER_SITE_OTHER): Payer: Medicare Other

## 2017-07-03 ENCOUNTER — Other Ambulatory Visit: Payer: Self-pay | Admitting: Family Medicine

## 2017-07-03 ENCOUNTER — Telehealth: Payer: Self-pay | Admitting: *Deleted

## 2017-07-03 VITALS — BP 140/62 | HR 64 | Temp 98.5°F | Ht 61.75 in | Wt 141.5 lb

## 2017-07-03 DIAGNOSIS — E663 Overweight: Secondary | ICD-10-CM

## 2017-07-03 DIAGNOSIS — Z8639 Personal history of other endocrine, nutritional and metabolic disease: Secondary | ICD-10-CM | POA: Diagnosis not present

## 2017-07-03 DIAGNOSIS — Z0000 Encounter for general adult medical examination without abnormal findings: Secondary | ICD-10-CM

## 2017-07-03 DIAGNOSIS — I10 Essential (primary) hypertension: Secondary | ICD-10-CM

## 2017-07-03 DIAGNOSIS — E7800 Pure hypercholesterolemia, unspecified: Secondary | ICD-10-CM

## 2017-07-03 DIAGNOSIS — Z01419 Encounter for gynecological examination (general) (routine) without abnormal findings: Secondary | ICD-10-CM

## 2017-07-03 DIAGNOSIS — E559 Vitamin D deficiency, unspecified: Secondary | ICD-10-CM | POA: Diagnosis not present

## 2017-07-03 LAB — LIPID PANEL
Cholesterol: 197 mg/dL (ref 0–200)
HDL: 101.2 mg/dL (ref >39.00)
LDL Cholesterol: 84 mg/dL (ref 0–99)
NonHDL: 95.95
Total CHOL/HDL Ratio: 2
Triglycerides: 60 mg/dL (ref 0.0–149.0)
VLDL: 12 mg/dL (ref 0.0–40.0)

## 2017-07-03 LAB — CBC WITH DIFFERENTIAL/PLATELET
Basophils Absolute: 0 10*3/uL (ref 0.0–0.1)
Basophils Relative: 0.2 % (ref 0.0–3.0)
Eosinophils Absolute: 0.1 10*3/uL (ref 0.0–0.7)
Eosinophils Relative: 0.6 % (ref 0.0–5.0)
HCT: 39.8 % (ref 36.0–46.0)
Hemoglobin: 13.4 g/dL (ref 12.0–15.0)
Lymphocytes Relative: 30.1 % (ref 12.0–46.0)
Lymphs Abs: 3.1 10*3/uL (ref 0.7–4.0)
MCHC: 33.7 g/dL (ref 30.0–36.0)
MCV: 88.1 fl (ref 78.0–100.0)
Monocytes Absolute: 0.7 10*3/uL (ref 0.1–1.0)
Monocytes Relative: 6.9 % (ref 3.0–12.0)
Neutro Abs: 6.4 10*3/uL (ref 1.4–7.7)
Neutrophils Relative %: 62.2 % (ref 43.0–77.0)
Platelets: 194 10*3/uL (ref 150.0–400.0)
RBC: 4.51 Mil/uL (ref 3.87–5.11)
RDW: 14.5 % (ref 11.5–15.5)
WBC: 10.3 10*3/uL (ref 4.0–10.5)

## 2017-07-03 LAB — TSH: TSH: 1.7 u[IU]/mL (ref 0.35–4.50)

## 2017-07-03 LAB — COMPREHENSIVE METABOLIC PANEL
ALT: 8 U/L (ref 0–35)
AST: 17 U/L (ref 0–37)
Albumin: 4.1 g/dL (ref 3.5–5.2)
Alkaline Phosphatase: 59 U/L (ref 39–117)
BUN: 11 mg/dL (ref 6–23)
CO2: 29 mEq/L (ref 19–32)
Calcium: 9.6 mg/dL (ref 8.4–10.5)
Chloride: 104 mEq/L (ref 96–112)
Creatinine, Ser: 0.85 mg/dL (ref 0.40–1.20)
GFR: 84.58 mL/min (ref >60.00)
Glucose, Bld: 82 mg/dL (ref 70–99)
Potassium: 4.2 mEq/L (ref 3.5–5.1)
Sodium: 138 mEq/L (ref 135–145)
Total Bilirubin: 0.7 mg/dL (ref 0.2–1.2)
Total Protein: 6.9 g/dL (ref 6.0–8.3)

## 2017-07-03 LAB — HEMOGLOBIN A1C: Hgb A1c MFr Bld: 6.1 % (ref 4.6–6.5)

## 2017-07-03 LAB — VITAMIN D 25 HYDROXY (VIT D DEFICIENCY, FRACTURES): VITD: 28.76 ng/mL — ABNORMAL LOW (ref 30.00–100.00)

## 2017-07-03 NOTE — Telephone Encounter (Signed)
Patient was seen in office and is wanting to transfer care to Dr. Damita Dunnings. Patient states she would prefer a Doctor. Patient would need a medicare appt and annual wellness appt next year as well as a new patient/ transfer appt. Please advise if it is ok to schedule. Thank you

## 2017-07-03 NOTE — Progress Notes (Signed)
PCP notes:   Health maintenance:  No gaps identified.   Abnormal screenings:   Depression score: 7  Patient concerns:   None  Nurse concerns:  None  Next PCP appt:   07/08/17 @ 1030

## 2017-07-03 NOTE — Progress Notes (Signed)
Pre visit review using our clinic review tool, if applicable. No additional management support is needed unless otherwise documented below in the visit note. 

## 2017-07-03 NOTE — Progress Notes (Signed)
Subjective:   Samantha [name] is a 71 y.o. female who presents for Medicare Annual (Subsequent) preventive examination.  Review of Systems:  N/A Cardiac Risk Factors include: advanced age (>60men, >27 women);hypertension     Objective:     Vitals: BP 140/62 (BP Location: Right Arm, Patient Position: Sitting, Cuff Size: Normal)   Pulse 64   Temp 98.5 F (36.9 C) (Oral)   Ht 5' 1.75" (1.568 m) Comment: no shoes  Wt 141 lb 8 oz (64.2 kg)   SpO2 98%   BMI 26.09 kg/m   Body mass index is 26.09 kg/m.   Tobacco History  Smoking Status  . Never Smoker  Smokeless Tobacco  . Never Used     Counseling given: No   Past Medical History:  Diagnosis Date  . ALLERGIC RHINITIS 07/26/2008  . ASTHMA 04/14/2007  . Asthma   . ASYMPTOMATIC POSTMENOPAUSAL STATUS 05/10/2008  . CHEST PAIN 08/26/2008  . COLONIC POLYPS, HX OF 04/14/2007   colonic leiomyoma  . Episcleritis   . FIBROIDS, UTERUS 05/10/2008  . Fibromyalgia   . GERD 04/14/2007  . HYPERCHOLESTEROLEMIA 01/07/2008  . HYPERGLYCEMIA 11/28/2009  . HYPERTENSION 01/28/2008  . INSOMNIA 05/10/2008  . Irritable bowel syndrome 04/06/2010  . OSTEOARTHRITIS 11/28/2009  . OSTEOPOROSIS 04/14/2007  . 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 SINUSITIS 02/10/2009   Past Surgical History:  Procedure Laterality Date  . COLONOSCOPY W/ POLYPECTOMY    . ELECTROCARDIOGRAM  12/04/2006  . LAPAROSCOPY    . NASAL POLYP SURGERY     Polypectomy and septoplasty  . Neck Injury  1983  . Stress Cardiolite  02/13/2002   Family History  Problem Relation Age of Onset  . Heart disease Father        CHF  . Allergic rhinitis Father   . Asthma Father   . Allergic rhinitis Sister   . Asthma Sister   . Allergic rhinitis Brother   . Asthma Brother   . Allergic rhinitis Maternal Aunt   . Asthma Maternal Aunt   . Allergic rhinitis Paternal Aunt   . Asthma Paternal Aunt   . Cancer Neg Hx        No FH of Colon Cancer  . Angioedema Neg Hx   . Atopy Neg Hx   .  Immunodeficiency Neg Hx   . Urticaria Neg Hx   . Eczema Neg Hx    History  Sexual Activity  . Sexual activity: No    Outpatient Encounter Prescriptions as of 07/03/2017  Medication Sig  . albuterol (PROVENTIL HFA) 108 (90 Base) MCG/ACT inhaler Inhale 2 puffs into the lungs every 6 (six) hours as needed.  . Ascorbic Acid (VITAMIN C) 500 MG tablet Take 500 mg by mouth daily.    . Beclomethasone Dipropionate (QNASL) 80 MCG/ACT AERS 1-2 sprays each nostril daily.  . bifidobacterium infantis (ALIGN) capsule Take 1 capsule by mouth daily.    Marland Kitchen BLACK CURRANT SEED OIL PO Take by mouth.  . cetirizine (ZYRTEC) 10 MG tablet Take 10 mg by mouth daily.  . Cholecalciferol (VITAMIN D3) 400 UNITS tablet Take 400 Units by mouth 2 (two) times daily with a meal.   . Coenzyme Q-10 60 MG CAPS Take 1 capsule by mouth 2 (two) times daily.   . Cyanocobalamin (VITAMIN B-12) 1000 MCG SUBL Place 1 tablet under the tongue daily. Contains Folic Acid, B6, and Biotin  . Fluticasone Furoate (ARNUITY ELLIPTA) 100 MCG/ACT AEPB Inhale 1 puff into the lungs daily.  . Ginger, Zingiber officinalis, (  GINGER PO) Take by mouth.  . losartan (COZAAR) 50 MG tablet Take 1 tablet (50 mg total) by mouth daily.  . Magnesium Malate POWD (1250mg  tabs) 1 tablet by mouth two times a day  . Manganese 50 MG TABS Take 50 mg by mouth.  . Misc Natural Products (TART CHERRY ADVANCED PO) Take by mouth.  . mometasone (ASMANEX) 220 MCG/INH inhaler Inhale 1 puff into the lungs daily.  . montelukast (SINGULAIR) 10 MG tablet 1 daily  . Olopatadine HCl (PATADAY) 0.2 % SOLN Place 1 drop into both eyes daily.  . Omega-3 Fatty Acids (OMEGA 3 PO) Take one tablet daily  . pantoprazole (PROTONIX) 40 MG tablet Take 1 tablet (40 mg total) by mouth daily.  Marland Kitchen thiamine 100 MG tablet Take 100 mg by mouth daily.    . TURMERIC PO Take by mouth daily.   No facility-administered encounter medications on file as of 07/03/2017.     Activities of Daily  Living In your present state of health, do you have any difficulty performing the following activities: 07/03/2017  Hearing? N  Vision? N  Difficulty concentrating or making decisions? N  Walking or climbing stairs? Y  Comment increased leg pain  Dressing or bathing? N  Doing errands, shopping? N  Preparing Food and eating ? N  Using the Toilet? N  In the past six months, have you accidently leaked urine? N  Do you have problems with loss of bowel control? N  Managing your Medications? N  Managing your Finances? N  Housekeeping or managing your Housekeeping? N  Some recent data might be hidden    Patient Care Team: Lucille Passy, MD as PCP - General (Family Medicine) Deneise Lever, MD (Pulmonary Disease) Ladene Artist, MD (Gastroenterology) Bo Merino, MD (Rheumatology) Arvella Nigh, MD as Consulting Physician (Obstetrics and Gynecology) Leandrew Koyanagi, MD as Referring Physician (Ophthalmology) Melissa Montane, MD as Consulting Physician (Otolaryngology) Melrose Nakayama, MD as Consulting Physician (Orthopedic Surgery) Druscilla Brownie, MD as Consulting Physician (Dermatology) Everitt Amber, MD as Consulting Physician (Obstetrics and Gynecology) Otis Brace, MD as Consulting Physician (Gastroenterology)    Assessment:     Hearing Screening   125Hz  250Hz  500Hz  1000Hz  2000Hz  3000Hz  4000Hz  6000Hz  8000Hz   Right ear:   40 40 40  40    Left ear:   40 40 40  40    Vision Screening Comments: Last vision exam in Oct 2018 with Dr. Wallace Going   Exercise Activities and Dietary recommendations Current Exercise Habits: The patient does not participate in regular exercise at present, Exercise limited by: None identified  Goals    . Increase physical activity          Starting 07/03/2017, I will continue to drink at least 6-8 glasses of water daily.       Fall Risk Fall Risk  07/03/2017 06/27/2016 06/08/2015 06/02/2014 05/13/2013  Falls in the past year? No No No  No No   Depression Screen PHQ 2/9 Scores 07/03/2017 06/27/2016 06/08/2015 06/02/2014  PHQ - 2 Score 1 0 0 0  PHQ- 9 Score 7 - - -     Cognitive Function MMSE - Mini Mental State Exam 07/03/2017 06/27/2016  Orientation to time 5 5  Orientation to Place 5 5  Registration 3 3  Attention/ Calculation 0 0  Recall 3 3  Language- name 2 objects 0 0  Language- repeat 1 1  Language- follow 3 step command 3 3  Language- read & follow direction 0 0  Write a sentence 0 0  Copy design 0 0  Total score 20 20     PLEASE NOTE: A Mini-Cog screen was completed. Maximum score is 20. A value of 0 denotes this part of Folstein MMSE was not completed or the patient failed this part of the Mini-Cog screening.   Mini-Cog Screening Orientation to Time - Max 5 pts Orientation to Place - Max 5 pts Registration - Max 3 pts Recall - Max 3 pts Language Repeat - Max 1 pts Language Follow 3 Step Command - Max 3 pts     Immunization History  Administered Date(s) Administered  . Influenza Split 06/04/2011, 05/27/2012  . Influenza Whole 08/18/2008, 05/26/2010  . Influenza,inj,Quad PF,6+ Mos 06/08/2013, 05/19/2014, 06/08/2015, 05/07/2016  . Pneumococcal Conjugate-13 03/17/2014  . Pneumococcal Polysaccharide-23 11/23/2009, 06/08/2015  . Td 11/09/2002  . Tdap 07/03/2013  . Zoster 06/17/2013   Screening Tests Health Maintenance  Topic Date Due  . MAMMOGRAM  11/05/2018  . COLONOSCOPY  04/21/2020  . TETANUS/TDAP  07/04/2023  . INFLUENZA VACCINE  Completed  . DEXA SCAN  Completed  . Hepatitis C Screening  Completed  . PNA vac Low Risk Adult  Completed      Plan:  I have personally reviewed, addressed, and noted the following in the patient's chart:  A. Medical and social history B. Use of alcohol, tobacco or illicit drugs  C. Current medications and supplements D. Functional ability and status E.  Nutritional status F.  Physical activity G. Advance directives H. List of other physicians I.   Hospitalizations, surgeries, and ER visits in previous 12 months J.  [place] to include hearing, vision, cognitive, depression L. Referrals and appointments - none  In addition, I have reviewed and discussed with patient certain preventive protocols, quality metrics, and best practice recommendations. A written personalized care plan for preventive services as well as general preventive health recommendations were provided to patient.  See attached scanned questionnaire for additional information.   Signed,   Lindell Noe, MHA, BS, LPN Health Coach

## 2017-07-03 NOTE — Patient Instructions (Addendum)
Samantha [name] , Thank you for taking time to come for your Medicare Wellness Visit. I appreciate your ongoing commitment to your health goals. Please review the following plan we discussed and let me know if I can assist you in the future.   These are the goals we discussed: Goals    . Increase physical activity          Starting 07/03/2017, I will continue to drink at least 6-8 glasses of water daily.        This is a list of the screening recommended for you and due dates:  Health Maintenance  Topic Date Due  . Mammogram  11/05/2018  . Colon Cancer Screening  04/21/2020  . Tetanus Vaccine  07/04/2023  . Flu Shot  Completed  . DEXA scan (bone density measurement)  Completed  .  Hepatitis C: One time screening is recommended by Center for Disease Control  (CDC) for  adults born from [birth year] through 1966.   Completed  . Pneumonia vaccines  Completed      Preventive Care for Adults  A healthy lifestyle and preventive care can promote health and wellness. Preventive health guidelines for adults include the following key practices.  . A routine yearly physical is a good way to check with your health care provider about your health and preventive screening. It is a chance to share any concerns and updates on your health and to receive a thorough exam.  . Visit your dentist for a routine exam and preventive care every 6 months. Brush your teeth twice a day and floss once a day. Good oral hygiene prevents tooth decay and gum disease.  . The frequency of eye exams is based on your age, health, family medical history, use  of contact lenses, and other factors. Follow your health care provider's recommendations for frequency of eye exams.  . Eat a healthy diet. Foods like vegetables, fruits, whole grains, low-fat dairy products, and lean protein foods contain the nutrients you need without too many calories. Decrease your intake of foods high in solid fats, added sugars, and salt. Eat the right  amount of calories for you. Get information about a proper diet from your health care provider, if necessary.  . Regular physical exercise is one of the most important things you can do for your health. Most adults should get at least 150 minutes of moderate-intensity exercise (any activity that increases your heart rate and causes you to sweat) each week. In addition, most adults need muscle-strengthening exercises on 2 or more days a week.  Silver Sneakers may be a benefit available to you. To determine eligibility, you may visit the website: www.silversneakers.com or contact program at 205-864-5623 Mon-Fri between 8AM-8PM.   . Maintain a healthy weight. The body mass index (BMI) is a screening tool to identify possible weight problems. It provides an estimate of body fat based on height and weight. Your health care provider can find your BMI and can help you achieve or maintain a healthy weight.   For adults 20 years and older: ? A BMI below 18.5 is considered underweight. ? A BMI of 18.5 to 24.9 is normal. ? A BMI of 25 to 29.9 is considered overweight. ? A BMI of 30 and above is considered obese.   . Maintain normal blood lipids and cholesterol levels by exercising and minimizing your intake of saturated fat. Eat a balanced diet with plenty of fruit and vegetables. Blood tests for lipids and cholesterol should begin at  age 85 and be repeated every 5 years. If your lipid or cholesterol levels are high, you are over 50, or you are at high risk for heart disease, you may need your cholesterol levels checked more frequently. Ongoing high lipid and cholesterol levels should be treated with medicines if diet and exercise are not working.  . If you smoke, find out from your health care provider how to quit. If you do not use tobacco, please do not start.  . If you choose to drink alcohol, please do not consume more than 2 drinks per day. One drink is considered to be 12 ounces (355 mL) of beer, 5  ounces (148 mL) of wine, or 1.5 ounces (44 mL) of liquor.  . If you are 46-36 years old, ask your health care provider if you should take aspirin to prevent strokes.  . Use sunscreen. Apply sunscreen liberally and repeatedly throughout the day. You should seek shade when your shadow is shorter than you. Protect yourself by wearing long sleeves, pants, a wide-brimmed hat, and sunglasses year round, whenever you are outdoors.  . Once a month, do a whole body skin exam, using a mirror to look at the skin on your back. Tell your health care provider of new moles, moles that have irregular borders, moles that are larger than a pencil eraser, or moles that have changed in shape or color.

## 2017-07-04 NOTE — Progress Notes (Signed)
I reviewed health advisor's note, was available for consultation, and agree with documentation and plan.  

## 2017-07-04 NOTE — Telephone Encounter (Signed)
Okay to transfer to me, would get transfer appointment in the next 6 months.  Thanks.

## 2017-07-08 ENCOUNTER — Encounter: Payer: Self-pay | Admitting: Family Medicine

## 2017-07-08 ENCOUNTER — Ambulatory Visit (INDEPENDENT_AMBULATORY_CARE_PROVIDER_SITE_OTHER): Payer: Medicare Other | Admitting: Family Medicine

## 2017-07-08 ENCOUNTER — Telehealth: Payer: Self-pay | Admitting: Family Medicine

## 2017-07-08 VITALS — BP 146/68 | HR 71 | Temp 98.1°F | Ht 62.0 in | Wt 144.4 lb

## 2017-07-08 DIAGNOSIS — E559 Vitamin D deficiency, unspecified: Secondary | ICD-10-CM | POA: Insufficient documentation

## 2017-07-08 DIAGNOSIS — I10 Essential (primary) hypertension: Secondary | ICD-10-CM | POA: Diagnosis not present

## 2017-07-08 DIAGNOSIS — J4520 Mild intermittent asthma, uncomplicated: Secondary | ICD-10-CM | POA: Diagnosis not present

## 2017-07-08 DIAGNOSIS — M797 Fibromyalgia: Secondary | ICD-10-CM

## 2017-07-08 DIAGNOSIS — I959 Hypotension, unspecified: Secondary | ICD-10-CM

## 2017-07-08 MED ORDER — LOSARTAN POTASSIUM 50 MG PO TABS: 50.0000 mg | ORAL_TABLET | Freq: Every day | ORAL | 3 refills | Status: DC

## 2017-07-08 MED ORDER — VITAMIN D (ERGOCALCIFEROL) 1.25 MG (50000 UNIT) PO CAPS: 50000.0000 [IU] | ORAL_CAPSULE | ORAL | 3 refills | Status: DC

## 2017-07-08 NOTE — Progress Notes (Signed)
Subjective:    Patient ID: Samantha [name], female    DOB: Jul 02, 1946, 71 y.o.   MRN: [id number]  HPI Very pleasant 71 yo female with h/o HTN, allergic rhinitis, GERD, asthma, fibromyalgia, here for follow up of chronic medical conditions.  Saw Candis Musa, RN for annual medicare wellness visit on 07/03/17.  Note reviewed.  Mammogram 11/05/16 Colonoscopy 04/21/10 prevnar 13 03/16/14 Pneumovax 05/19/15 Tdap 07/03/13 Zoster 06/17/13 Influenza vaccine 06/05/17  Health Maintenance  Topic Date Due  . MAMMOGRAM  11/05/2018  . COLONOSCOPY  04/21/2020  . TETANUS/TDAP  07/04/2023  . INFLUENZA VACCINE  Completed  . DEXA SCAN  Completed  . Hepatitis C Screening  Completed  . PNA vac Low Risk Adult  Completed   She has been tired but Vit D is low again this month.  Asthma - sees Dr. Annamaria Boots.  Las saw him on 02/05/17- note reviewed. Also receiving allergy shots.  Fibromyalgia- followed by Dr. Estanislado Pandy.     HTN- now taking Cozaar 50 mg daily. Denies HA, blurred vision, CP or SOB.  No LE edema. Lab Results  Component Value Date   CREATININE 0.85 07/03/2017   Lab Results  Component Value Date   CHOL 197 07/03/2017   HDL 101.20 07/03/2017   LDLCALC 84 07/03/2017   TRIG 60.0 07/03/2017   CHOLHDL 2 07/03/2017   Lab Results  Component Value Date   WBC 10.3 07/03/2017   HGB 13.4 07/03/2017   HCT 39.8 07/03/2017   MCV 88.1 07/03/2017   PLT 194.0 07/03/2017   Lab Results  Component Value Date   TSH 1.70 07/03/2017    Patient Active Problem List   Diagnosis Date Noted  . 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/19/2017  . Primary osteoarthritis of both knees 01/19/2017  . Primary osteoarthritis of both feet 01/19/2017  . SOB (shortness of breath) 11/14/2016  . Diverticulosis 03/19/2016  . Lumbar compression fracture ([place]) 06/20/2015  . Increased pressure in the eye 06/02/2014  . Fibromyalgia 02/26/2013  . Allergic rhinitis due to pollen  11/20/2010  . 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 IRRITABLE BOWEL SYNDROME 04/06/2010  . THYROID NODULE, RIGHT 12/07/2009  . OSTEOARTHRITIS 11/28/2009  . FIBROIDS, UTERUS 05/10/2008  . Insomnia 05/10/2008  . ASYMPTOMATIC POSTMENOPAUSAL STATUS 05/10/2008  . Essential hypertension 01/28/2008  . HYPERCHOLESTEROLEMIA 01/07/2008  . GLAUCOMA 01/07/2008  . Asthma, mild intermittent 04/14/2007  . GERD 04/14/2007  . History of colonic polyps 04/14/2007   Past Medical History:  Diagnosis Date  . ALLERGIC RHINITIS 07/26/2008  . ASTHMA 04/14/2007  . Asthma   . ASYMPTOMATIC POSTMENOPAUSAL STATUS 05/10/2008  . CHEST PAIN 08/26/2008  . COLONIC POLYPS, HX OF 04/14/2007   colonic leiomyoma  . Episcleritis   . FIBROIDS, UTERUS 05/10/2008  . Fibromyalgia   . GERD 04/14/2007  . HYPERCHOLESTEROLEMIA 01/07/2008  . HYPERGLYCEMIA 11/28/2009  . HYPERTENSION 01/28/2008  . INSOMNIA 05/10/2008  . Irritable bowel syndrome 04/06/2010  . OSTEOARTHRITIS 11/28/2009  . OSTEOPOROSIS 04/14/2007  . 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 SINUSITIS 02/10/2009   Past Surgical History:  Procedure Laterality Date  . COLONOSCOPY W/ POLYPECTOMY    . ELECTROCARDIOGRAM  12/04/2006  . LAPAROSCOPY    . NASAL POLYP SURGERY     Polypectomy and septoplasty  . Neck Injury  1983  . Stress Cardiolite  02/13/2002   Social History  Substance Use Topics  . Smoking status: Never Smoker  . Smokeless tobacco: Never Used  . Alcohol use No  Family History  Problem Relation Age of Onset  . Heart disease Father        CHF  . Allergic rhinitis Father   . Asthma Father   . Allergic rhinitis Sister   . Asthma Sister   . Allergic rhinitis Brother   . Asthma Brother   . Allergic rhinitis Maternal Aunt   . Asthma Maternal Aunt   . Allergic rhinitis Paternal Aunt   . Asthma Paternal Aunt   . Cancer Neg Hx        No FH of Colon Cancer  . Angioedema Neg Hx   . Atopy Neg Hx   . Immunodeficiency Neg Hx   . Urticaria Neg Hx   . Eczema Neg Hx     Allergies  Allergen Reactions  . Cefuroxime Axetil     REACTION: pt not sure  . Sulfonamide Derivatives     REACTION: pt not sure   Current Outpatient Prescriptions on File Prior to Visit  Medication Sig Dispense Refill  . albuterol (PROVENTIL HFA) 108 (90 Base) MCG/ACT inhaler Inhale 2 puffs into the lungs every 6 (six) hours as needed. 3 Inhaler 3  . Ascorbic Acid (VITAMIN C) 500 MG tablet Take 500 mg by mouth daily.      . Beclomethasone Dipropionate (QNASL) 80 MCG/ACT AERS 1-2 sprays each nostril daily. 3 Inhaler 3  . bifidobacterium infantis (ALIGN) capsule Take 1 capsule by mouth daily.      . cetirizine (ZYRTEC) 10 MG tablet Take 10 mg by mouth daily.    . Cholecalciferol (VITAMIN D3) 400 UNITS tablet Take 400 Units by mouth 2 (two) times daily with a meal.     . Coenzyme Q-10 60 MG CAPS Take 1 capsule by mouth 2 (two) times daily.     . Cyanocobalamin (VITAMIN B-12) 1000 MCG SUBL Place 1 tablet under the tongue daily. Contains Folic Acid, B6, and Biotin    . Fluticasone Furoate (ARNUITY ELLIPTA) 100 MCG/ACT AEPB Inhale 1 puff into the lungs daily. 90 each 2  . Ginger, Zingiber officinalis, (GINGER PO) Take by mouth.    . losartan (COZAAR) 50 MG tablet Take 1 tablet (50 mg total) by mouth daily. 90 tablet 3  . Magnesium Malate POWD (1250mg  tabs) 1 tablet by mouth two times a day    . Manganese 50 MG TABS Take 50 mg by mouth.    . Misc Natural Products (TART CHERRY ADVANCED PO) Take by mouth.    . mometasone (ASMANEX) 220 MCG/INH inhaler Inhale 1 puff into the lungs daily. 1 Inhaler 5  . montelukast (SINGULAIR) 10 MG tablet 1 daily    . Olopatadine HCl (PATADAY) 0.2 % SOLN Place 1 drop into both eyes daily. 1 Bottle 3  . Omega-3 Fatty Acids (OMEGA 3 PO) Take one tablet daily    . pantoprazole (PROTONIX) 40 MG tablet Take 1 tablet (40 mg total) by mouth daily. 30 tablet 3  . thiamine 100 MG tablet Take 100 mg by mouth daily.      . TURMERIC PO Take by mouth daily.     No  current facility-administered medications on file prior to visit.    The PMH, PSH, Social History, Family History, Medications, and allergies have been reviewed in [facility], and have been updated if relevant.       Review of Systems  Constitutional: Positive for fatigue. Negative for chills and fever.  HENT: Negative for congestion, postnasal drip, rhinorrhea, sinus pressure, sneezing, trouble swallowing and voice change.  Eyes: Negative.   Respiratory: Negative for cough, shortness of breath, wheezing and stridor.   Cardiovascular: Negative.   Gastrointestinal: Negative.   Endocrine: Negative.   Genitourinary: Negative.   Musculoskeletal: Negative.   Skin: Negative.   Allergic/Immunologic: Negative.   Neurological: Negative.   Hematological: Negative.   Psychiatric/Behavioral: Negative.   All other systems reviewed and are negative.   Objective:   Physical Exam  BP (!) 146/68 (BP Location: Left Arm, Patient Position: Sitting, Cuff Size: Normal)   Pulse 71   Temp 98.1 F (36.7 C) (Oral)   Ht 5\' 2"  (1.575 m)   Wt 144 lb 6.4 oz (65.5 kg)   SpO2 98%   BMI 26.41 kg/m   BP Readings from Last 3 Encounters:  07/08/17 (!) 146/68  07/03/17 140/62  06/19/17 132/80    General:  Well-developed,well-nourished,in no acute distress; alert,appropriate and cooperative throughout examination Head:  normocephalic and atraumatic.   Eyes:  vision grossly intact, PERRL Ears:  R ear normal and L ear normal externally, TMs clear bilaterally Nose:  no external deformity.   Mouth:  good dentition.   Neck:  No deformities, masses, or tenderness noted. Breasts:  No mass, nodules, thickening, tenderness, bulging, retraction, inflamation, nipple discharge or skin changes noted.   Lungs:  Normal respiratory effort, chest expands symmetrically. Lungs are clear to auscultation, no crackles or wheezes. Heart:  Normal rate and regular rhythm. S1 and S2 normal without gallop, murmur, click, rub or other  extra sounds. Abdomen:  Bowel sounds positive,abdomen soft and non-tender without masses, organomegaly or hernias noted. Msk:  No deformity or scoliosis noted of thoracic or lumbar spine.   Extremities:  No clubbing, cyanosis, edema, or deformity noted with normal full range of motion of all joints.   Neurologic:  alert & oriented X3 and gait normal.   Skin:  Intact without suspicious lesions or rashes Cervical Nodes:  No lymphadenopathy noted Axillary Nodes:  No palpable lymphadenopathy Psych:  Cognition and judgment appear intact. Alert and cooperative with normal attention span and concentration. No apparent delusions, illusions, hallucinations       Assessment & Plan:

## 2017-07-08 NOTE — Assessment & Plan Note (Signed)
Followed by Dr. Annamaria Boots and allergist. Doing well. No changes made to rxs.

## 2017-07-08 NOTE — Patient Instructions (Signed)
Good to see you. We are restarting high dose vitamin d- 50000 IU weekly for 12 weeks.

## 2017-07-08 NOTE — Telephone Encounter (Signed)
Patient was seen today.  Patient forgot to ask Dr.Aron to refill her blood pressure medication.  She'd like it sent to [facility] mail order.  90 day with 3 refills.

## 2017-07-08 NOTE — Assessment & Plan Note (Signed)
Reasonable control. No changes made. 

## 2017-07-08 NOTE — Assessment & Plan Note (Signed)
Deteriorated. Restart high dose Vit D- 50000 IU weekly for 12 weeks. The patient indicates understanding of these issues and agrees with the plan.

## 2017-07-08 NOTE — Telephone Encounter (Signed)
Patient notified Dr.Duncan will see her.  Patient scheduled appointment in April.

## 2017-07-08 NOTE — Telephone Encounter (Signed)
Completed/thx dmf 

## 2017-07-09 NOTE — Progress Notes (Signed)
Office Visit Note  Patient: Samantha [name]             Date of Birth: 06/05/1946           MRN: [id number]             PCP: Tonia Ghent, MD Referring: Lucille Passy, MD Visit Date: 07/22/2017 Occupation: @GUAROCC @    Subjective:  Osteoarthritis (doing good ), pelvic pain.   History of Present Illness: Samantha [name] is a 71 y.o. female with history of fibromyalgia and osteoarthritis. She states she's been having some discomfort in the pelvic region for which she seen her PCP and GYN. She also has underlying osteoarthritis in her hips knees and feet which causes some discomfort. She states her fibromyalgia is doing fairly well.  Activities of Daily Living:  Patient reports morning stiffness for 30 minutes.   Patient Reports nocturnal pain.  Difficulty dressing/grooming: Denies Difficulty climbing stairs: Reports Difficulty getting out of chair: Denies Difficulty using hands for taps, buttons, cutlery, and/or writing: Denies   Review of Systems  Constitutional: Positive for fatigue. Negative for night sweats, weight gain, weight loss and weakness.  HENT: Negative for ear pain, mouth sores, sore throat, trouble swallowing, trouble swallowing, mouth dryness and nose dryness.   Eyes: Positive for dryness. Negative for pain, redness, itching and visual disturbance.  Respiratory: Negative for cough, shortness of breath and difficulty breathing.   Cardiovascular: Negative for chest pain, palpitations, hypertension, irregular heartbeat and swelling in legs/feet.  Gastrointestinal: Negative for abdominal pain, blood in stool, constipation, diarrhea and nausea.  Endocrine: Negative for cold intolerance and increased urination.  Genitourinary: Positive for pelvic pain. Negative for vaginal dryness.  Musculoskeletal: Positive for arthralgias, joint pain and morning stiffness. Negative for joint swelling, myalgias, muscle weakness, muscle tenderness and myalgias.  Skin: Negative for  color change, rash, hair loss, redness, skin tightness, ulcers and sensitivity to sunlight.  Allergic/Immunologic: Negative for susceptible to infections.  Neurological: Negative for dizziness, fainting, headaches, memory loss and night sweats.  Hematological: Negative for bruising/bleeding tendency and swollen glands.  Psychiatric/Behavioral: Negative for depressed mood and sleep disturbance. The patient is not nervous/anxious.     PMFS History:  Patient Active Problem List   Diagnosis Date Noted  . Vitamin D deficiency 07/08/2017  . 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/19/2017  . Primary osteoarthritis of both knees 01/19/2017  . Primary osteoarthritis of both feet 01/19/2017  . SOB (shortness of breath) 11/14/2016  . Diverticulosis 03/19/2016  . Lumbar compression fracture ([place]) 06/20/2015  . Increased pressure in the eye 06/02/2014  . Fibromyalgia 02/26/2013  . Allergic rhinitis due to pollen 11/20/2010  . 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 IRRITABLE BOWEL SYNDROME 04/06/2010  . THYROID NODULE, RIGHT 12/07/2009  . OSTEOARTHRITIS 11/28/2009  . FIBROIDS, UTERUS 05/10/2008  . Insomnia 05/10/2008  . ASYMPTOMATIC POSTMENOPAUSAL STATUS 05/10/2008  . Essential hypertension 01/28/2008  . HYPERCHOLESTEROLEMIA 01/07/2008  . GLAUCOMA 01/07/2008  . Asthma, mild intermittent 04/14/2007  . GERD 04/14/2007  . History of colonic polyps 04/14/2007    Past Medical History:  Diagnosis Date  . ALLERGIC RHINITIS 07/26/2008  . ASTHMA 04/14/2007  . Asthma   . ASYMPTOMATIC POSTMENOPAUSAL STATUS 05/10/2008  . CHEST PAIN 08/26/2008  . COLONIC POLYPS, HX OF 04/14/2007   colonic leiomyoma  . Episcleritis   . FIBROIDS, UTERUS 05/10/2008  . Fibromyalgia   . GERD 04/14/2007  . HYPERCHOLESTEROLEMIA 01/07/2008  . HYPERGLYCEMIA 11/28/2009  .  HYPERTENSION 01/28/2008  . INSOMNIA 05/10/2008  . Irritable bowel syndrome 04/06/2010  . OSTEOARTHRITIS 11/28/2009  .  OSTEOPOROSIS 04/14/2007  . 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 SINUSITIS 02/10/2009    Family History  Problem Relation Age of Onset  . Heart disease Father        CHF  . Allergic rhinitis Father   . Asthma Father   . Allergic rhinitis Sister   . Asthma Sister   . Allergic rhinitis Brother   . Asthma Brother   . Allergic rhinitis Maternal Aunt   . Asthma Maternal Aunt   . Allergic rhinitis Paternal Aunt   . Asthma Paternal Aunt   . Cancer Neg Hx        No FH of Colon Cancer  . Angioedema Neg Hx   . Atopy Neg Hx   . Immunodeficiency Neg Hx   . Urticaria Neg Hx   . Eczema Neg Hx    Past Surgical History:  Procedure Laterality Date  . COLONOSCOPY W/ POLYPECTOMY    . ELECTROCARDIOGRAM  12/04/2006  . LAPAROSCOPY    . NASAL SEPTUM SURGERY    . Stress Cardiolite  02/13/2002   Social History   Social History Narrative   Lives alone (widowed).   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      Does not have living will.  Does desire CPR.  Does not want life support for prolonged periods of time if futile.     Objective: Vital Signs: BP (!) 158/73 (BP Location: Left Arm, Patient Position: Sitting, Cuff Size: Normal)   Pulse 66   Resp 16   Ht 5\' 2"  (1.575 m)   Wt 145 lb (65.8 kg)   BMI 26.52 kg/m    Physical Exam  Constitutional: She is oriented to person, place, and time. She appears well-developed and well-nourished.  HENT:  Head: Normocephalic and atraumatic.  Eyes: Conjunctivae and EOM are normal.  Neck: Normal range of motion.  Cardiovascular: Normal rate, regular rhythm, normal heart sounds and intact distal pulses.  Pulmonary/Chest: Effort normal and breath sounds normal.  Abdominal: Soft. Bowel sounds are normal.  Lymphadenopathy:    She has no cervical adenopathy.  Neurological: She is alert and oriented to person, place, and time.  Skin: Skin is warm and dry. Capillary refill takes less than 2 seconds.  Psychiatric: She has a normal mood and affect. Her  behavior is normal.  Nursing note and vitals reviewed.    Musculoskeletal Exam: C-spine and thoracic lumbar spine good range of motion. Shoulder joints elbow joints wrist joint MCPs PIPs DIPs with good range of motion with no synovitis. Hip joints knee joints ankles MTPs PIPs DIPs with good range of motion with no synovitis.  CDAI Exam: No CDAI exam completed.    Investigation: No additional findings. CBC Latest Ref Rng & Units 07/03/2017 11/14/2016 06/27/2016  WBC 4.0 - 10.5 K/uL 10.3 6.6 5.6  Hemoglobin 12.0 - 15.0 g/dL 13.4 13.2 13.0  Hematocrit 36.0 - 46.0 % 39.8 38.7 38.3  Platelets 150.0 - 400.0 K/uL 194.0 226.0 204.0   CMP Latest Ref Rng & Units 07/03/2017 06/27/2016 05/30/2015  Glucose 70 - 99 mg/dL 82 90 108(H)  BUN 6 - 23 mg/dL 11 14 13   Creatinine 0.40 - 1.20 mg/dL 0.85 0.87 0.87  Sodium 135 - 145 mEq/L 138 141 140  Potassium 3.5 - 5.1 mEq/L 4.2 4.3 3.9  Chloride 96 - 112 mEq/L 104 104 105  CO2 19 - 32 mEq/L 29  30 29  Calcium 8.4 - 10.5 mg/dL 9.6 10.2 9.4  Total Protein 6.0 - 8.3 g/dL 6.9 7.4 7.1  Total Bilirubin 0.2 - 1.2 mg/dL 0.7 0.5 0.4  Alkaline Phos 39 - 117 U/L 59 79 57  AST 0 - 37 U/L 17 20 16   ALT 0 - 35 U/L 8 11 8     Imaging: No results found.  Speciality Comments: No specialty comments available.    Procedures:  No procedures performed Allergies: Cefuroxime axetil and Sulfonamide derivatives   Assessment / Plan:     Visit Diagnoses: Fibromyalgia: Wrist symptoms have improved with minimal pain and discomfort.  Raynaud's disease without gangrene: Proper proper clothing and keeping body temperature warm was discussed. She had no active Raynaud's today.  Primary osteoarthritis of both hips: She has minimal discomfort range of motion of her hip joints.  Primary osteoarthritis of both knees: She has some discomfort in her knee joints off-and-on. Lower extremity muscle strengthening exercise was discussed.  Primary osteoarthritis of both feet: Proper  fitting shoes were discussed.  Other fatigue: Her fatigue is better.  Other insomnia: She is noticed improvement in her insomnia.  History of vertebral compression fracture - L4 compression fracture - T score -0.9 2016  History of vitamin D deficiency:  she is on vitamin D supplement. Patient states that her vitamin D was still recently which was low.  History of IBS  History of hypertension: Blood pressure is elevated today she's been advised to follow-up with her PCP.  Other medical problems are listed as follows:  History of kidney stones  History of glaucoma  History of asthma  History of diverticulosis  History of gastroesophageal reflux (GERD)  History of hyperlipidemia    Orders: No orders of the defined types were placed in this encounter.  No orders of the defined types were placed in this encounter.   Face-to-face time spent with patient was 30 minutes. Greater than 50% of time was spent in counseling and coordination of care.  Follow-Up Instructions: Return in about 1 year (around 07/22/2018) for FMS OA.   Bo Merino, MD  Note - This record has been created using Editor, commissioning.  Chart creation errors have been sought, but may not always  have been located. Such creation errors do not reflect on  the standard of medical care.

## 2017-07-15 ENCOUNTER — Ambulatory Visit: Payer: Medicare Other | Attending: Gynecologic Oncology | Admitting: Gynecologic Oncology

## 2017-07-15 ENCOUNTER — Encounter: Payer: Self-pay | Admitting: Gynecologic Oncology

## 2017-07-15 VITALS — BP 153/64 | HR 88 | Temp 97.4°F | Resp 22 | Wt 140.9 lb

## 2017-07-15 DIAGNOSIS — M797 Fibromyalgia: Secondary | ICD-10-CM | POA: Insufficient documentation

## 2017-07-15 DIAGNOSIS — J45909 Unspecified asthma, uncomplicated: Secondary | ICD-10-CM | POA: Diagnosis not present

## 2017-07-15 DIAGNOSIS — M1990 Unspecified osteoarthritis, unspecified site: Secondary | ICD-10-CM | POA: Insufficient documentation

## 2017-07-15 DIAGNOSIS — I10 Essential (primary) hypertension: Secondary | ICD-10-CM | POA: Diagnosis not present

## 2017-07-15 DIAGNOSIS — N83201 Unspecified ovarian cyst, right side: Secondary | ICD-10-CM | POA: Insufficient documentation

## 2017-07-15 DIAGNOSIS — N83209 Unspecified ovarian cyst, unspecified side: Secondary | ICD-10-CM

## 2017-07-15 DIAGNOSIS — K589 Irritable bowel syndrome without diarrhea: Secondary | ICD-10-CM | POA: Diagnosis not present

## 2017-07-15 DIAGNOSIS — Z79899 Other long term (current) drug therapy: Secondary | ICD-10-CM | POA: Diagnosis not present

## 2017-07-15 DIAGNOSIS — K219 Gastro-esophageal reflux disease without esophagitis: Secondary | ICD-10-CM | POA: Insufficient documentation

## 2017-07-15 DIAGNOSIS — Z882 Allergy status to sulfonamides status: Secondary | ICD-10-CM | POA: Insufficient documentation

## 2017-07-15 NOTE — Progress Notes (Signed)
Consult Note: Gyn-Onc  Consult was requested by Dr. Teofilo Pod for the evaluation of Samantha [name] 71 y.o. female  CC:  Chief Complaint  Patient presents with  . Cyst of ovary, unspecified laterality    Assessment/Plan:  Ms. SIMAR POTHIER  is a 71 y.o.  year old with a complex right ovarian cyst and normal CA 125.   I discussed with Tayja and her family that I had a low suspicion of malignancy given the description of the ovarian cysts on imaging, the normal CA-125 value, and the fact that she has had a cyst present since 2015 which speaks to a natural history of stability. It has grown only slightly (<1cm) in the past year. Her CA 125 remains low and stable.  She is a fairly low risk surgical candidate, and therefore I offered her BSO if she is anxious about this cyst and desires definitive management. I discussed no surgery is without risk, and therefore, unless indicated for patient reassurance, I am not going to pursue surgery at this time.  She does not require ongoing surveillance with ultrasounds as this cyst is unlikely to resolve. However, if she has development of new symptoms, a repeat US is reasonable.  HPI: Samantha [name] is a 71 year old G3P3 who is seen in consultation at the request of Dr Radene Knee for right complex ovarian cysts. The patient has a known history of bilateral ovarian cysts and had a diagnostic laparoscopy approximately 10 years ago when biopsy was performed of her ovaries and these were benign.   She last had an [place] in 2015 which per patient showed ovarian cysts.  She began experiencing mild pelvic discomfort and abdominal bloating in the spring of 2017. She feels her discomfort is worse with bowel movements and eating.  She has a known history of diverticulosis.  She underwent a transvaginal US on 02/20/16 with Dr Radene Knee which showed a uterus measuring 5.6 x 2.5 x 4.2 cm with a 2 x 1.5 cm posterior subserosal fibroid. The left ovary was within normal limits.  The right ovary contained multiple cystic areas, the largest with septations and possibly a papillary projection, but no increased blood flow seen. The largest cyst measured 2.7 x 2.2 cm there were 2 other simple appearing cyst within the right ovary which measured 1.8 cm and 1.1 cm. No adnexal masses or free fluid was seen. A CA-125 drawn on 02/20/2016 was normal at 18 units per milliliter.  The patient is fairly healthy. Her BMI is 25kg.m2. She has asthma, fibromyalgia, irritable bowel syndrome, renal its disease, as of 1 prior abdominal surgery which was a diagnostic laparoscopy possibly in 2007. She has no first-degree family relatives to have a history of malignancy and has remote family history of breast cancer but not in a constellation concerning for hereditary breast ovarian cancer syndrome.  Interval Hx: Repeat US on 06/03/17 showed slight increase in the cyst to 3.5x2.2x2.8cm. It contained a 30mm papillary projection with blood flow (which had been present previously). The CA 125 was rechecked and remained low at 12.  Current Meds:  Outpatient Encounter Medications as of 07/15/2017  Medication Sig  . albuterol (PROVENTIL HFA) 108 (90 Base) MCG/ACT inhaler Inhale 2 puffs into the lungs every 6 (six) hours as needed.  . Ascorbic Acid (VITAMIN C) 500 MG tablet Take 500 mg by mouth daily.    . Beclomethasone Dipropionate (QNASL) 80 MCG/ACT AERS 1-2 sprays each nostril daily.  . bifidobacterium infantis (ALIGN) capsule Take 1  capsule by mouth daily.    . cetirizine (ZYRTEC) 10 MG tablet Take 10 mg by mouth daily.  . Cholecalciferol (VITAMIN D3) 400 UNITS tablet Take 400 Units by mouth 2 (two) times daily with a meal.   . Coenzyme Q-10 60 MG CAPS Take 1 capsule by mouth 2 (two) times daily.   . Cyanocobalamin (VITAMIN B-12) 1000 MCG SUBL Place 1 tablet under the tongue daily. Contains Folic Acid, B6, and Biotin  . Fluticasone Furoate (ARNUITY ELLIPTA) 100 MCG/ACT AEPB Inhale 1 puff into the  lungs daily.  . Ginger, Zingiber officinalis, (GINGER PO) Take by mouth.  . losartan (COZAAR) 50 MG tablet Take 1 tablet (50 mg total) by mouth daily.  . Magnesium Malate POWD (1250mg  tabs) 1 tablet by mouth two times a day  . Manganese 50 MG TABS Take 50 mg by mouth.  . Misc Natural Products (TART CHERRY ADVANCED PO) Take by mouth.  . mometasone (ASMANEX) 220 MCG/INH inhaler Inhale 1 puff into the lungs daily.  . montelukast (SINGULAIR) 10 MG tablet 1 daily  . Olopatadine HCl (PATADAY) 0.2 % SOLN Place 1 drop into both eyes daily.  . Omega-3 Fatty Acids (OMEGA 3 PO) Take one tablet daily  . pantoprazole (PROTONIX) 40 MG tablet Take 1 tablet (40 mg total) by mouth daily.  Marland Kitchen thiamine 100 MG tablet Take 100 mg by mouth daily.    . TURMERIC PO Take by mouth daily.  . Vitamin D, Ergocalciferol, (DRISDOL) 50000 units CAPS capsule Take 1 capsule (50,000 Units total) by mouth every 7 (seven) days.   No facility-administered encounter medications on file as of 07/15/2017.     Allergy:  Allergies  Allergen Reactions  . Cefuroxime Axetil     REACTION: pt not sure  . Sulfonamide Derivatives     REACTION: pt not sure    Social Hx:   Social History   Socioeconomic History  . Marital status: Widowed    Spouse name: Not on file  . Number of children: 3  . Years of education: Not on file  . Highest education level: Not on file  Social Needs  . Financial resource strain: Not on file  . Food insecurity - worry: Not on file  . Food insecurity - inability: Not on file  . Transportation needs - medical: Not on file  . Transportation needs - non-medical: Not on file  Occupational History    Employer: RETIRED    Comment: Retired Stage manager)  Tobacco Use  . Smoking status: Never Smoker  . Smokeless tobacco: Never Used  Substance and Sexual Activity  . Alcohol use: No  . Drug use: No  . Sexual activity: No  Other Topics Concern  . Not on file  Social History Narrative   Lives alone  (widowed).   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      Does not have living will.  Does desire CPR.  Does not want life support for prolonged periods of time if futile.    Past Surgical Hx:  Past Surgical History:  Procedure Laterality Date  . COLONOSCOPY W/ POLYPECTOMY    . ELECTROCARDIOGRAM  12/04/2006  . LAPAROSCOPY    . NASAL POLYP SURGERY     Polypectomy and septoplasty  . Neck Injury  1983  . Stress Cardiolite  02/13/2002    Past Medical Hx:  Past Medical History:  Diagnosis Date  . ALLERGIC RHINITIS 07/26/2008  . ASTHMA 04/14/2007  . Asthma   . ASYMPTOMATIC  POSTMENOPAUSAL STATUS 05/10/2008  . CHEST PAIN 08/26/2008  . COLONIC POLYPS, HX OF 04/14/2007   colonic leiomyoma  . Episcleritis   . FIBROIDS, UTERUS 05/10/2008  . Fibromyalgia   . GERD 04/14/2007  . HYPERCHOLESTEROLEMIA 01/07/2008  . HYPERGLYCEMIA 11/28/2009  . HYPERTENSION 01/28/2008  . INSOMNIA 05/10/2008  . Irritable bowel syndrome 04/06/2010  . OSTEOARTHRITIS 11/28/2009  . OSTEOPOROSIS 04/14/2007  . 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 SINUSITIS 02/10/2009    Past Gynecological History:  SVD x 3. Diagnostic laparoscopy 2007 for ovarian cysts  No LMP recorded. Patient is postmenopausal.  Family Hx:  Family History  Problem Relation Age of Onset  . Heart disease Father        CHF  . Allergic rhinitis Father   . Asthma Father   . Allergic rhinitis Sister   . Asthma Sister   . Allergic rhinitis Brother   . Asthma Brother   . Allergic rhinitis Maternal Aunt   . Asthma Maternal Aunt   . Allergic rhinitis Paternal Aunt   . Asthma Paternal Aunt   . Cancer Neg Hx        No FH of Colon Cancer  . Angioedema Neg Hx   . Atopy Neg Hx   . Immunodeficiency Neg Hx   . Urticaria Neg Hx   . Eczema Neg Hx     Review of Systems:  Constitutional  Feels well,    ENT Normal appearing ears and nares bilaterally Skin/Breast  No rash, sores, jaundice, itching, dryness Cardiovascular  No chest pain, shortness of  breath, or edema  Pulmonary  No cough or wheeze.  Gastro Intestinal  No nausea, vomitting, or diarrhoea. No bright red blood per rectum, no abdominal pain, change in bowel movement, or constipation.  Genito Urinary  No frequency, urgency, dysuria, mild pelvic pain Musculo Skeletal  No myalgia, arthralgia, joint swelling or pain  Neurologic  No weakness, numbness, change in gait,  Psychology  No depression, anxiety, insomnia.   Vitals:  Blood pressure (!) 153/64, pulse 88, temperature (!) 97.4 F (36.3 C), temperature source Oral, resp. rate (!) 22, weight 140 lb 14.4 oz (63.9 kg), SpO2 100 %.  Physical Exam: WD in NAD Neck  Supple NROM, without any enlargements.  Lymph Node Survey No cervical supraclavicular or inguinal adenopathy Cardiovascular  Pulse normal rate, regularity and rhythm. S1 and S2 normal.  Lungs  Clear to auscultation bilateraly, without wheezes/crackles/rhonchi. Good air movement.  Skin  No rash/lesions/breakdown  Psychiatry  Alert and oriented to person, place, and time  Abdomen  Normoactive bowel sounds, abdomen soft, non-tender and nonobese without evidence of hernia.  Back No CVA tenderness Genito Urinary  Vulva/vagina: Normal external female genitalia.  No lesions. No discharge or bleeding.  Bladder/urethra:  No lesions or masses, well supported bladder  Vagina: normal  Cervix: Normal appearing, no lesions.  Uterus:  Small, mobile, no parametrial involvement or nodularity.  Adnexa: no palpable masses. There is some mild tenderness in the RLQ Rectal  deferred.  Extremities  No bilateral cyanosis, clubbing or edema.   Donaciano Eva, MD  07/15/2017, 12:07 PM

## 2017-07-15 NOTE — Patient Instructions (Signed)
Dr Denman George does not feel that the right ovarian cyst is malignant (cancer). She does not feel that there is a strong indication for surgery. However, she would be happy to perform a surgery (laparoscopic removal of both tubes and ovaries) if you would like this to be performed for reassurance. However, she does not feel that this is medically necessary at this time.  She is not recommending further ultrasounds unless your symptoms are worse.  You can contact Dr Denman George

## 2017-07-22 ENCOUNTER — Ambulatory Visit (INDEPENDENT_AMBULATORY_CARE_PROVIDER_SITE_OTHER): Payer: Medicare Other | Admitting: Rheumatology

## 2017-07-22 ENCOUNTER — Encounter: Payer: Self-pay | Admitting: Rheumatology

## 2017-07-22 VITALS — BP 158/73 | HR 66 | Resp 16 | Ht 62.0 in | Wt 145.0 lb

## 2017-07-22 DIAGNOSIS — Z8639 Personal history of other endocrine, nutritional and metabolic disease: Secondary | ICD-10-CM

## 2017-07-22 DIAGNOSIS — M797 Fibromyalgia: Secondary | ICD-10-CM

## 2017-07-22 DIAGNOSIS — G4709 Other insomnia: Secondary | ICD-10-CM

## 2017-07-22 DIAGNOSIS — M160 Bilateral primary osteoarthritis of hip: Secondary | ICD-10-CM

## 2017-07-22 DIAGNOSIS — Z8719 Personal history of other diseases of the digestive system: Secondary | ICD-10-CM | POA: Diagnosis not present

## 2017-07-22 DIAGNOSIS — I7300 Raynaud's syndrome without gangrene: Secondary | ICD-10-CM

## 2017-07-22 DIAGNOSIS — Z87442 Personal history of urinary calculi: Secondary | ICD-10-CM

## 2017-07-22 DIAGNOSIS — M19072 Primary osteoarthritis, left ankle and foot: Secondary | ICD-10-CM

## 2017-07-22 DIAGNOSIS — Z8709 Personal history of other diseases of the respiratory system: Secondary | ICD-10-CM

## 2017-07-22 DIAGNOSIS — Z8781 Personal history of (healed) traumatic fracture: Secondary | ICD-10-CM | POA: Diagnosis not present

## 2017-07-22 DIAGNOSIS — R5383 Other fatigue: Secondary | ICD-10-CM

## 2017-07-22 DIAGNOSIS — M170 Bilateral primary osteoarthritis of knee: Secondary | ICD-10-CM

## 2017-07-22 DIAGNOSIS — G5603 Carpal tunnel syndrome, bilateral upper limbs: Secondary | ICD-10-CM | POA: Diagnosis not present

## 2017-07-22 DIAGNOSIS — Z8669 Personal history of other diseases of the nervous system and sense organs: Secondary | ICD-10-CM

## 2017-07-22 DIAGNOSIS — Z8679 Personal history of other diseases of the circulatory system: Secondary | ICD-10-CM | POA: Diagnosis not present

## 2017-07-22 DIAGNOSIS — M19071 Primary osteoarthritis, right ankle and foot: Secondary | ICD-10-CM

## 2017-08-27 ENCOUNTER — Ambulatory Visit: Payer: Medicare Other | Admitting: Allergy and Immunology

## 2017-08-28 ENCOUNTER — Other Ambulatory Visit: Payer: Self-pay

## 2017-08-28 ENCOUNTER — Encounter: Payer: Self-pay | Admitting: Allergy and Immunology

## 2017-08-28 ENCOUNTER — Other Ambulatory Visit: Payer: Self-pay | Admitting: Allergy and Immunology

## 2017-08-28 ENCOUNTER — Ambulatory Visit (INDEPENDENT_AMBULATORY_CARE_PROVIDER_SITE_OTHER): Payer: Medicare Other | Admitting: Allergy and Immunology

## 2017-08-28 VITALS — BP 112/74 | HR 70

## 2017-08-28 DIAGNOSIS — J3089 Other allergic rhinitis: Secondary | ICD-10-CM | POA: Diagnosis not present

## 2017-08-28 DIAGNOSIS — J4530 Mild persistent asthma, uncomplicated: Secondary | ICD-10-CM

## 2017-08-28 DIAGNOSIS — K219 Gastro-esophageal reflux disease without esophagitis: Secondary | ICD-10-CM | POA: Diagnosis not present

## 2017-08-28 MED ORDER — ALBUTEROL SULFATE HFA 108 (90 BASE) MCG/ACT IN AERS: 2.0000 | INHALATION_SPRAY | Freq: Four times a day (QID) | RESPIRATORY_TRACT | 3 refills | Status: DC | PRN

## 2017-08-28 MED ORDER — MONTELUKAST SODIUM 10 MG PO TABS: 10.0000 mg | ORAL_TABLET | Freq: Every day | ORAL | 3 refills | Status: DC

## 2017-08-28 MED ORDER — BECLOMETHASONE DIPROPIONATE 80 MCG/ACT NA AERS: INHALATION_SPRAY | NASAL | 5 refills | Status: DC

## 2017-08-28 MED ORDER — RANITIDINE HCL 300 MG PO TABS: 300.0000 mg | ORAL_TABLET | Freq: Every day | ORAL | 5 refills | Status: DC

## 2017-08-28 MED ORDER — BECLOMETHASONE DIPROPIONATE 80 MCG/ACT NA AERS: 2.0000 | INHALATION_SPRAY | NASAL | 5 refills | Status: DC

## 2017-08-28 MED ORDER — RANITIDINE HCL 300 MG PO TABS: 300.0000 mg | ORAL_TABLET | Freq: Every day | ORAL | 3 refills | Status: DC

## 2017-08-28 NOTE — Progress Notes (Signed)
Follow-up Note  Referring Provider: Lucille Passy, MD Primary Provider: Lucille Passy, MD Date of Office Visit: 08/28/2017  Subjective:   Samantha [name] (DOB: [birth year]) is a 72 y.o. female who returns to the [facility] [place] on 08/28/2017 in re-evaluation of the following:  HPI: Samantha [name] returns to this clinic in reevaluation of her allergies and asthma and reflux.  Her last visit to this clinic was 28 May 2017.  She has been having problems with sternal chest pain and chest tightness and occasional shortness of breath.  It does not matter if she is at rest or exertion.  She will use a pro-air and it does help her.  She has been using this [employer] about twice a day.  She has had a cardiac workup in the past for some issues with chest pain.  Her last chest x-ray was 2017.  She has also been having an issue with throat clearing and raspy voice.  She can no longer sing very well.  She has seen Dr. Wilburn [name] in the past for this issue.  In addition she has had a decreased ability to smell although overall that is better since she has started a nasal steroid.  She has had a sinus CT scan in the past 2 years which did not identify any chronic sinusitis.  She does not have any classic reflux at this point in time.  She is not consuming any chocolate or caffeine.  Allergies as of 08/28/2017      Reactions   Cefuroxime Axetil    REACTION: pt not sure   Sulfonamide Derivatives    REACTION: pt not sure      Medication List      albuterol 108 (90 Base) MCG/ACT inhaler Commonly known as:  PROVENTIL HFA Inhale 2 puffs into the lungs every 6 (six) hours as needed.   Beclomethasone Dipropionate 80 MCG/ACT Aers Commonly known as:  QNASL 1-2 sprays each nostril daily.   Beclomethasone Dipropionate 80 MCG/ACT Aers Commonly known as:  QNASL Place 2 sprays into both nostrils 1 day or 1 dose.   bifidobacterium infantis capsule Take 1 capsule by mouth daily.   cetirizine  10 MG tablet Commonly known as:  ZYRTEC Take 10 mg by mouth daily.   Coenzyme Q-10 60 MG Caps Take 1 capsule by mouth 2 (two) times daily.   GINGER PO Take by mouth.   losartan 50 MG tablet Commonly known as:  COZAAR Take 1 tablet (50 mg total) by mouth daily.   Magnesium Malate Powd (1250mg  tabs) 1 tablet by mouth two times a day   Manganese 50 MG Tabs Take 50 mg by mouth.   mometasone 220 MCG/INH inhaler Commonly known as:  ASMANEX Inhale 1 puff into the lungs daily.   montelukast 10 MG tablet Commonly known as:  SINGULAIR 1 daily   Olopatadine HCl 0.2 % Soln Commonly known as:  PATADAY Place 1 drop into both eyes daily.   OMEGA 3 PO Take one tablet daily   pantoprazole 40 MG tablet Commonly known as:  PROTONIX Take 1 tablet (40 mg total) by mouth daily.   ranitidine 300 MG tablet Commonly known as:  ZANTAC Take 1 tablet (300 mg total) by mouth at bedtime.   TART CHERRY ADVANCED PO Take by mouth.   thiamine 100 MG tablet Take 100 mg by mouth daily.   TURMERIC PO Take by mouth daily.   Vitamin B-12 1000 MCG Subl Place 1 tablet  under the tongue daily. Contains Folic Acid, B6, and Biotin   vitamin C 500 MG tablet Commonly known as:  ASCORBIC ACID Take 500 mg by mouth daily.   Vitamin D (Ergocalciferol) 50000 units Caps capsule Commonly known as:  DRISDOL Take 1 capsule (50,000 Units total) by mouth every 7 (seven) days.   Vitamin D3 400 units tablet Take 400 Units by mouth 2 (two) times daily with a meal.       Past Medical History:  Diagnosis Date  . ALLERGIC RHINITIS 07/26/2008  . ASTHMA 04/14/2007  . Asthma   . ASYMPTOMATIC POSTMENOPAUSAL STATUS 05/10/2008  . CHEST PAIN 08/26/2008  . COLONIC POLYPS, HX OF 04/14/2007   colonic leiomyoma  . Episcleritis   . FIBROIDS, UTERUS 05/10/2008  . Fibromyalgia   . GERD 04/14/2007  . HYPERCHOLESTEROLEMIA 01/07/2008  . HYPERGLYCEMIA 11/28/2009  . HYPERTENSION 01/28/2008  . INSOMNIA 05/10/2008  . Irritable  bowel syndrome 04/06/2010  . OSTEOARTHRITIS 11/28/2009  . OSTEOPOROSIS 04/14/2007  . 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 SINUSITIS 02/10/2009    Past Surgical History:  Procedure Laterality Date  . COLONOSCOPY W/ POLYPECTOMY    . ELECTROCARDIOGRAM  12/04/2006  . LAPAROSCOPY    . NASAL SEPTUM SURGERY    . Stress Cardiolite  02/13/2002    Review of systems negative except as noted in HPI / PMHx or noted below:  Review of Systems  Constitutional: Negative.   HENT: Negative.   Eyes: Negative.   Respiratory: Negative.   Cardiovascular: Negative.   Gastrointestinal: Negative.   Genitourinary: Negative.   Musculoskeletal: Negative.   Skin: Negative.   Neurological: Negative.   Endo/Heme/Allergies: Negative.   Psychiatric/Behavioral: Negative.      Objective:   Vitals:   08/28/17 1048  BP: 112/74  Pulse: 70  SpO2: 97%          Physical Exam  Constitutional: She is well-developed, well-nourished, and in no distress.  Raspy voice  HENT:  Head: Normocephalic.  Right Ear: Tympanic membrane, external ear and ear canal normal.  Left Ear: Tympanic membrane, external ear and ear canal normal.  Nose: Nose normal. No mucosal edema or rhinorrhea.  Mouth/Throat: Uvula is midline, oropharynx is clear and moist and mucous membranes are normal. No oropharyngeal exudate.  Eyes: Conjunctivae are normal.  Neck: Trachea normal. No tracheal tenderness present. No tracheal deviation present. No thyromegaly present.  Cardiovascular: Normal rate, regular rhythm, S1 normal, S2 normal and normal heart sounds.  No murmur heard. Pulmonary/Chest: Breath sounds normal. No stridor. No respiratory distress. She has no wheezes. She has no rales.  Musculoskeletal: She exhibits no edema.  Lymphadenopathy:       Head (right side): No tonsillar adenopathy present.       Head (left side): No tonsillar adenopathy present.    She has no cervical adenopathy.  Neurological: She is alert. Gait normal.  Skin: No  rash noted. She is not diaphoretic. No erythema. Nails show no clubbing.  Psychiatric: Mood and affect normal.    Diagnostics:    Spirometry was performed and demonstrated an FEV1 of 1.70 at 98 % of predicted.  The patient had an Asthma Control Test with the following results: ACT Total Score: 14.    Assessment and Plan:   1. Not well controlled mild persistent asthma   2. Other allergic rhinitis   3. LPRD (laryngopharyngeal reflux disease)      1. Continue to Treat and prevent inflammation:   A. Qnasl 80 - 1-2 sprays each nostril one  time per day  B. montelukast 10 mg - one tablet one time per day  C. Symbicort 160 two inhalation two times per day with spacer  3. Continue to Treat and prevent reflux:   A. Continue off caffeine and chocolate   B. Continue pantoprazole 40 mg - one tablet one time per day in AM  C. Start Ranitidine 300 mg one tablet one time per day    4. If needed:   A. ProAir HFA 2 puffs every 4-6 hours  B. nasal saline wash  C. OTC antihistamine - Claritin/Zyrtec/Allegra   D. Pataday one drop each eye one time per day   5. "Action Plan" for flare up:   A. Add Asmanex 220 Twisthaler 2 inhalations to Symbicort twice a day  B. Use Proair Hfa if needed  6. Have Dr Samantha [name] look at throat for hoarseness   7. Return to clinic in 4 weeks or earlier if problem   I am going to treat Cally with a little bit more aggressive anti-inflammatory therapy for her airway and also treat her a little more aggressively for reflux as noted above.  I think it would be worthwhile to have her throat looked at one more time by Dr. Wilburn [name] but she would like to hold off on further evaluation for this issue until she sees what type of response she gets with increased therapy directed against reflux.  We will hold off on any other imaging procedure at this point in time.  I will see her back in this clinic in 4 weeks or earlier if there is a problem.   Allena Katz,  MD [facility]

## 2017-08-28 NOTE — Telephone Encounter (Signed)
Received fax for refill on Qnasl 80 MCG. Needing clarification. Reordered with new sig.

## 2017-08-28 NOTE — Patient Instructions (Addendum)
  1. Continue to Treat and prevent inflammation:   A. Qnasl 80 - 1-2 sprays each nostril one time per day  B. montelukast 10 mg - one tablet one time per day  C. Symbicort 160 two inhalation two times per day with spacer  3. Continue to Treat and prevent reflux:   A. Continue off caffeine and chocolate   B. Continue pantoprazole 40 mg - one tablet one time per day in AM  C. Start Ranitidine 300 mg one tablet one time per day    4. If needed:   A. ProAir HFA 2 puffs every 4-6 hours  B. nasal saline wash  C. OTC antihistamine - Claritin/Zyrtec/Allegra   D. Pataday one drop each eye one time per day   5. "Action Plan" for flare up:   A. Add Asmanex 220 Twisthaler 2 inhalations to Symbicort twice a day  B. Use Proair Hfa if needed  6. Have Dr Wilburn Cornelia look at throat for hoarseness   7. Return to clinic in 4 weeks or earlier if problem

## 2017-08-29 ENCOUNTER — Encounter: Payer: Self-pay | Admitting: Allergy and Immunology

## 2017-09-13 ENCOUNTER — Telehealth: Payer: Self-pay | Admitting: Allergy and Immunology

## 2017-09-13 MED ORDER — MONTELUKAST SODIUM 10 MG PO TABS: 10.0000 mg | ORAL_TABLET | Freq: Every day | ORAL | 3 refills | Status: DC

## 2017-09-13 NOTE — Telephone Encounter (Signed)
Pt called and needs to have Singulair called into local [facility] in [place] because she never received the meds from [facility] mail order. Can you check to see what happen. [phone].

## 2017-09-13 NOTE — Telephone Encounter (Signed)
Left detailed message advising patient that rx was sent on 08-28-17 and that I have sent it today to the [facility] as requested. I also advised her that I was not sure why she never received the medication from [facility], but she would need to contact them and find out where the medication was sent.

## 2017-09-23 ENCOUNTER — Telehealth: Payer: Self-pay | Admitting: Family Medicine

## 2017-09-23 NOTE — Telephone Encounter (Signed)
Pt notified to contact pharmacy regarding recall of Losartan. Explained to pt to call office back if pharmacy states medication needs to be changed. Pt verbalized understanding.

## 2017-09-23 NOTE — Telephone Encounter (Signed)
Copied from [place] [phone]. Topic: General - Other >> Sep 23, 2017  4:44 PM Neva Seat wrote: Pryor Montes 50mg   Pt needs to discuss the recall with nurse.  Please call pt back to discuss.

## 2017-09-30 ENCOUNTER — Encounter: Payer: Self-pay | Admitting: Family Medicine

## 2017-09-30 ENCOUNTER — Ambulatory Visit (INDEPENDENT_AMBULATORY_CARE_PROVIDER_SITE_OTHER): Payer: Medicare Other | Admitting: Family Medicine

## 2017-09-30 VITALS — BP 140/74 | HR 68 | Ht 62.0 in | Wt 145.0 lb

## 2017-09-30 DIAGNOSIS — K219 Gastro-esophageal reflux disease without esophagitis: Secondary | ICD-10-CM | POA: Diagnosis not present

## 2017-09-30 DIAGNOSIS — J4541 Moderate persistent asthma with (acute) exacerbation: Secondary | ICD-10-CM

## 2017-09-30 DIAGNOSIS — J3089 Other allergic rhinitis: Secondary | ICD-10-CM | POA: Diagnosis not present

## 2017-09-30 NOTE — Progress Notes (Signed)
[address] [place]  [address] Dept: [phone]  FOLLOW UP NOTE  Patient ID: Samantha [name], female    DOB: 02-Dec-1945  Age: 72 y.o. MRN: [id number] Date of Office Visit: 09/30/2017  Assessment  Chief Complaint: Follow-up (Pt presents for follow up of allergies, asthma and acid reflux.)  HPI Samantha [name] is a 72 year old female who presents to the clinic today for a follow up visit. She was last seen in the clinic on 08/28/2017 for follow up of asthma, allergies, and reflux.  At that time, she reported having problems with substernal chest pain and chest tightness and occasional shortness of breath.  She also reported throat clearing and raspy voice in addition to trouble singing.  At that visit she was started on Symbicort 160-2 puffs twice a day, Qnasl 80 1-2 sprays in each nostril once a day, montelukast 10 mg once a day, and ranitidine 300 mg once a day.  At today's visit, she is reporting her asthma has been moderately well controlled.  She reports shortness of breath as somewhat better since her last visit.  She does report tightness in her chest with activity which is also somewhat better since her last visit.  She denies wheezing at rest or with activity.  She is currently using Symbicort 160-2 puffs twice a day, montelukast 10 mg daily, and ProAir twice a day on a schedule rather than as needed.  She has not used Asmanex 220 Twisthaler in addition to the Symbicort.  Thick nasal drainage and congestion are reported as moderately improved since last visit.  Samantha [name] is reporting frequent throat clearing as well as cough. She reports this drainage as causing her laryngitis and frequent throat clearing. She is currently taking Zyrtec once a day and had a day 1 drop in each eye once a day with relief.  She reports an intermittent pain in her chest that only occurs when she has been lying down in her bed. The pain is described as sharp, worse while lying on her back, and resolves  with burping. She currently takes omeprazole and ranitidine and has noticed a decrease in this pain since her last visit to this office. She does consume a small amount of caffeine.   Her current medications are listed in the chart.  Drug Allergies:  Allergies  Allergen Reactions  . Cefuroxime Axetil     REACTION: pt not sure  . Sulfonamide Derivatives     REACTION: pt not sure    Physical Exam: BP 140/74 (BP Location: Left Arm, Patient Position: Sitting, Cuff Size: Normal)   Pulse 68   Ht 5\' 2"  (1.575 m)   Wt 145 lb (65.8 kg)   SpO2 96%   BMI 26.52 kg/m    Physical Exam  Constitutional: She is oriented to person, place, and time. She appears well-developed and well-nourished.  HENT:  Right Ear: External ear normal.  Left Ear: External ear normal.  Bilateral nares erythematous and edematous with clear nasal drainage noted.  Ears normal.  Eyes normal.  Pharynx slightly erythematous with no exudate noted.  Eyes: Conjunctivae are normal.  Neck: Normal range of motion. Neck supple.  Nodule like area under thyroid on the left side of her neck that is fixed.  Cardiovascular: Normal rate, regular rhythm and normal heart sounds.  S1-S2 normal.  Heart rate and rhythm.  No murmur noted.   Pulmonary/Chest: Effort normal and breath sounds normal.  Lung sounds clear to auscultation  Musculoskeletal: Normal range  of motion.  Neurological: She is alert and oriented to person, place, and time.  Skin: Skin is warm and dry.  Psychiatric: She has a normal mood and affect. Her behavior is normal.    Diagnostics: FVC 2.01, FEV1 1.66.  Predicted FVC 1.59.  Predicted FEV1 2.06.  Spirometry indicates mild restriction.  Postbronchodilator therapy FVC 2.14, FEV1 1.77.  Normal ventilatory function noted after bronchodilator therapy.  Assessment and Plan: 1. Moderate persistent asthma with acute exacerbation   2. Other allergic rhinitis   3. LPRD (laryngopharyngeal reflux disease)        Patient Instructions   1. Continue to Treat and prevent inflammation:   A. Qnasl 80 - 1-2 sprays each nostril one time per day  B. montelukast 10 mg - one tablet one time per day  C. Symbicort 160 two inhalation two times per day with spacer (sample provided in the office today)  3. Continue to Treat and prevent reflux:   A. Continue off caffeine and chocolate   B. Continue pantoprazole 40 mg - one tablet one time per day in AM  C. Continue Ranitidine 300 mg one tablet one time per day    4. If needed:   A. ProAir HFA 2 puffs every 4-6 hours  B. nasal saline wash  C. OTC antihistamine - Claritin/Zyrtec/Allegra   D. Pataday one drop each eye one time per day     5. "Action Plan" for flare up:   A. Add Asmanex 220 Twisthaler (samples provided in the office today) 2 inhalations to Symbicort twice a day  B. Use Proair Hfa if needed  6. Have ENT specialist look at throat for hoarseness. We will make this referral.   7. To decrease thick mucus:  Mucinex [phone] mg twice a day for thick mucus (samples provided in the office today).  Increase fluid intake as tolerated.  8. Return to clinic in 8 weeks or earlier if problem. Call me if this treatment plan is not working for you. Continue other medications as listed in your chart.       Return in about 2 months (around 11/28/2017), or if symptoms worsen or fail to improve.    Thank you for the opportunity to care for this patient.  Please do not hesitate to contact me with questions.  Samantha [name], FNP [facility]   I have provided oversight concerning [name]'s evaluation and treatment of this patient's health issues addressed during today's encounter.  I agree with the assessment and therapeutic plan as outlined in the note.   Signed,   R Edgar Frisk, MD

## 2017-09-30 NOTE — Patient Instructions (Addendum)
  1. Continue to Treat and prevent inflammation:   A. Qnasl 80 - 1-2 sprays each nostril one time per day  B. montelukast 10 mg - one tablet one time per day  C. Symbicort 160 two inhalation two times per day with spacer (sample provided in the office today)  3. Continue to Treat and prevent reflux:   A. Continue off caffeine and chocolate   B. Continue pantoprazole 40 mg - one tablet one time per day in AM  C. Continue Ranitidine 300 mg one tablet one time per day    4. If needed:   A. ProAir HFA 2 puffs every 4-6 hours  B. nasal saline wash  C. OTC antihistamine - Claritin/Zyrtec/Allegra   D. Pataday one drop each eye one time per day     5. "Action Plan" for flare up:   A. Add Asmanex 220 Twisthaler (samples provided in the office today) 2 inhalations to Symbicort twice a day  B. Use Proair Hfa if needed  6. Have ENT specialist look at throat for hoarseness. We will make this referral.   7. To decrease thick mucus:  Mucinex [phone] mg twice a day for thick mucus (samples provided in the office today).  Increase fluid intake as tolerated.  8. Return to clinic in 8 weeks or earlier if problem. Call me if this treatment plan is not working for you. Continue other medications as listed in your chart.

## 2017-10-02 NOTE — Progress Notes (Signed)
Faxed referral to [facility] office

## 2017-10-03 ENCOUNTER — Telehealth: Payer: Self-pay | Admitting: Family Medicine

## 2017-10-03 NOTE — Telephone Encounter (Signed)
Do you have a preference? Can she go back to Dr. Melissa Montane? He is an ENT with [facility]. Please advise.

## 2017-10-03 NOTE — Telephone Encounter (Signed)
Can you guys make sure the referral goes to Dr. Janace Hoard? Dx is in the note below.

## 2017-10-03 NOTE — Telephone Encounter (Signed)
She can see Dr. Janace Hoard. Can you please just make sure he gets her last cinic visit notes and is aware of the left sided nodule below her thyroid. Thank you

## 2017-10-03 NOTE — Telephone Encounter (Signed)
Patient called and said she had been referred from our office, to Dr. Thea Gist. She said she doesn't know anything about this doctor and would like to know why she was sent to him and not the doctor she has seen before, Dr. Janace Hoard. She would like to know if there is a specific reason.

## 2017-10-04 NOTE — Telephone Encounter (Signed)
Referral faxed to Dr Janace Hoard, Will contact patient.

## 2017-10-11 DIAGNOSIS — K219 Gastro-esophageal reflux disease without esophagitis: Secondary | ICD-10-CM | POA: Diagnosis not present

## 2017-10-11 DIAGNOSIS — R490 Dysphonia: Secondary | ICD-10-CM | POA: Diagnosis not present

## 2017-10-14 ENCOUNTER — Ambulatory Visit (INDEPENDENT_AMBULATORY_CARE_PROVIDER_SITE_OTHER): Payer: Medicare Other | Admitting: Family Medicine

## 2017-10-14 ENCOUNTER — Encounter: Payer: Self-pay | Admitting: Family Medicine

## 2017-10-14 DIAGNOSIS — J040 Acute laryngitis: Secondary | ICD-10-CM

## 2017-10-14 DIAGNOSIS — N83201 Unspecified ovarian cyst, right side: Secondary | ICD-10-CM | POA: Diagnosis not present

## 2017-10-14 MED ORDER — PREDNISONE 20 MG PO TABS: 20.0000 mg | ORAL_TABLET | Freq: Every day | ORAL | 0 refills | Status: DC

## 2017-10-14 NOTE — Progress Notes (Signed)
She had aches on lovastatin.  Off statin currently.  D/w pt.    We talked about taper of her med list.  May be reasonable to stop ginger in the meantime.  Unclear if ginger is contributing to GERD.     She is living alone and considering options re: housing.  She is still independent.  3 kids, out of town.    H/o asthma.  On baseline inhalers.  Seeing Dr. Neldon Mc re: asthma, he added zantac in the meantime.  She gargles after using steroid inhalers.    Laryngitis for about 2 weeks.  Congestion is some better now.  No fevers.  No vomiting.  No diarrhea.  Some cough, with some white sputum.  No ear pain now but prev with R ear pain.  She has similar in the past that improved with voice rest and prednisone course.    PMH and SH reviewed  ROS: Per HPI unless specifically indicated in ROS section   Meds, vitals, and allergies reviewed.   GEN: nad, alert and oriented HEENT: mucous membranes moist, tm w/o erythema, nasal exam w/o erythema, some clear discharge noted,  OP with mild cobblestoning, hoarse voice, sinuses not ttp  NECK: supple w/o LA CV: rrr.   PULM: ctab, no inc wob EXT: no edema SKIN: no acute rash

## 2017-10-14 NOTE — Patient Instructions (Addendum)
Try stopping the ginger and see if that helps.   Add on prednisone 20mg  a day and update me as needed.  Take the prednisone with food.   Take care.  Glad to see you.

## 2017-10-15 DIAGNOSIS — J040 Acute laryngitis: Secondary | ICD-10-CM | POA: Insufficient documentation

## 2017-10-15 NOTE — Assessment & Plan Note (Signed)
Nontoxic, no stridor.  Okay for outpatient f/u.  Would continue baseline meds except stop ginger, see above, and start short course of prednisone.  Update me as needed.  She agrees.

## 2017-10-21 ENCOUNTER — Other Ambulatory Visit: Payer: Self-pay | Admitting: Obstetrics and Gynecology

## 2017-10-21 DIAGNOSIS — Z1231 Encounter for screening mammogram for malignant neoplasm of breast: Secondary | ICD-10-CM

## 2017-11-11 ENCOUNTER — Ambulatory Visit
Admission: RE | Admit: 2017-11-11 | Discharge: 2017-11-11 | Disposition: A | Discharge status: Home / Self Care | Payer: Medicare Other | Source: Ambulatory Visit | Attending: Obstetrics and Gynecology | Admitting: Obstetrics and Gynecology

## 2017-11-11 DIAGNOSIS — Z1231 Encounter for screening mammogram for malignant neoplasm of breast: Secondary | ICD-10-CM | POA: Diagnosis not present

## 2017-11-12 ENCOUNTER — Encounter: Payer: Self-pay | Admitting: *Deleted

## 2017-12-02 DIAGNOSIS — J3089 Other allergic rhinitis: Secondary | ICD-10-CM | POA: Diagnosis not present

## 2017-12-02 DIAGNOSIS — D649 Anemia, unspecified: Secondary | ICD-10-CM | POA: Diagnosis not present

## 2017-12-04 ENCOUNTER — Ambulatory Visit (INDEPENDENT_AMBULATORY_CARE_PROVIDER_SITE_OTHER): Payer: Medicare Other | Admitting: Allergy and Immunology

## 2017-12-04 ENCOUNTER — Encounter: Payer: Self-pay | Admitting: Allergy and Immunology

## 2017-12-04 VITALS — BP 130/70 | HR 68 | Resp 18

## 2017-12-04 DIAGNOSIS — J3089 Other allergic rhinitis: Secondary | ICD-10-CM | POA: Diagnosis not present

## 2017-12-04 DIAGNOSIS — K219 Gastro-esophageal reflux disease without esophagitis: Secondary | ICD-10-CM | POA: Diagnosis not present

## 2017-12-04 DIAGNOSIS — J4540 Moderate persistent asthma, uncomplicated: Secondary | ICD-10-CM

## 2017-12-04 MED ORDER — OLOPATADINE HCL 0.2 % OP SOLN: 1.0000 [drp] | Freq: Every day | OPHTHALMIC | 3 refills | Status: DC

## 2017-12-04 MED ORDER — BUDESONIDE-FORMOTEROL FUMARATE 160-4.5 MCG/ACT IN AERO: 2.0000 | INHALATION_SPRAY | Freq: Two times a day (BID) | RESPIRATORY_TRACT | 3 refills | Status: DC

## 2017-12-04 NOTE — Patient Instructions (Addendum)
  1. Continue to Treat and prevent inflammation:   A. Qnasl 80 - 1-2 sprays each nostril one time per day  B. montelukast 10 mg - one tablet one time per day  C. Symbicort 160 two inhalation two times per day with spacer  2. Continue to Treat and prevent reflux:   A. Continue off caffeine and chocolate   B. Pantoprazole 40 mg - one tablet one time per day in AM  C. Ranitidine 300 mg one tablet one time per day    3. If needed:   A. ProAir HFA 2 puffs every 4-6 hours  B. nasal saline wash  C. OTC antihistamine - Claritin/Zyrtec/Allegra   D. Pataday one drop each eye one time per day   E. Mucinex DM   4. "Action Plan" for flare up:   A. Add Asmanex 220 Twisthaler 2 inhalations to Symbicort twice a day  B. Use Proair Hfa if needed  5. Check blood - area 2 aeroallergen profile. Immunotherapy?  6. Return to clinic in 3 months or earlier if problem

## 2017-12-04 NOTE — Progress Notes (Signed)
Follow-up Note  Referring Provider: Lucille Passy, MD Primary Provider: Lucille Passy, MD Date of Office Visit: 12/04/2017  Subjective:   Samantha [name] (DOB: 12/26/1945) is a 72 y.o. female who returns to the [facility] on 12/04/2017 in re-evaluation of the following:  HPI: Novia returns to this clinic in evaluation of asthma and allergic rhinitis and LPR.  Her last visit to this clinic was 30 September 2017 at which point time she saw our nurse practitioner and was doing relatively well although she still continued to have some issues with reflux induced hoarseness.  She did visit with Dr. Wilburn Cornelia who documented rhinoscopic evidence of reflux affecting her laryngeal structures.  At this point she thinks that her hoarseness is pretty much gone.  She was given some systemic steroids for this issue by her primary care doctor.  She has eliminated all caffeine and chocolate consumption and ginger supplements and overall just feels better regarding this issue.  She is not having any classic reflux symptoms.  She has had very little issues with her asthma.  She rarely uses a short acting bronchodilator.  She continues to have issues with nasal congestion and sneezing.  A few weeks back she had some pressure in her maxillary sinuses and she went to see her dentist who gave her amoxicillin.  Fortunately a lot of that issue has resolved.  Allergies as of 12/04/2017      Reactions   Cefuroxime Axetil    REACTION: pt not sure of reaction   Lovastatin Other (See Comments)   Aches   Sulfonamide Derivatives    REACTION: pt not sure of reaction      Medication List      albuterol 108 (90 Base) MCG/ACT inhaler Commonly known as:  PROVENTIL HFA Inhale 2 puffs into the lungs every 6 (six) hours as needed.   amoxicillin 500 MG capsule Commonly known as:  AMOXIL TAKE 1 CAPSULE BY MOUTH 3 TIMES A DAY UNTIL GONE   Beclomethasone Dipropionate 80 MCG/ACT Aers Commonly known as:   QNASL Two sprays into both nostrils daily   bifidobacterium infantis capsule Take 1 capsule by mouth daily.   budesonide-formoterol 160-4.5 MCG/ACT inhaler Commonly known as:  SYMBICORT Inhale 2 puffs into the lungs 2 (two) times daily.   cetirizine 10 MG tablet Commonly known as:  ZYRTEC Take 10 mg by mouth daily.   Coenzyme Q-10 60 MG Caps Take 1 capsule by mouth 2 (two) times daily.   losartan 50 MG tablet Commonly known as:  COZAAR Take 1 tablet (50 mg total) by mouth daily.   Magnesium Malate Powd (1250mg  tabs) 1 tablet by mouth two times a day   Manganese 50 MG Tabs Take 50 mg by mouth.   mometasone 220 MCG/INH inhaler Commonly known as:  ASMANEX Inhale 1 puff into the lungs daily.   montelukast 10 MG tablet Commonly known as:  SINGULAIR Take 1 tablet (10 mg total) by mouth daily.   Olopatadine HCl 0.2 % Soln Commonly known as:  PATADAY Place 1 drop into both eyes daily.   OMEGA 3 PO Take one tablet daily   pantoprazole 40 MG tablet Commonly known as:  PROTONIX Take 1 tablet (40 mg total) by mouth daily.   ranitidine 300 MG tablet Commonly known as:  ZANTAC Take 1 tablet (300 mg total) by mouth at bedtime.   TART CHERRY ADVANCED PO Take by mouth.   thiamine 100 MG tablet Take 100 mg  by mouth daily.   TURMERIC PO Take by mouth daily.   Vitamin B-12 1000 MCG Subl Place 1 tablet under the tongue daily. Contains Folic Acid, B6, and Biotin   vitamin C 500 MG tablet Commonly known as:  ASCORBIC ACID Take 500 mg by mouth daily.       Past Medical History:  Diagnosis Date  . ALLERGIC RHINITIS 07/26/2008  . ASTHMA 04/14/2007  . Asthma   . ASYMPTOMATIC POSTMENOPAUSAL STATUS 05/10/2008  . CHEST PAIN 08/26/2008  . COLONIC POLYPS, HX OF 04/14/2007   colonic leiomyoma  . Episcleritis   . FIBROIDS, UTERUS 05/10/2008  . Fibromyalgia   . GERD 04/14/2007  . HYPERCHOLESTEROLEMIA 01/07/2008  . HYPERGLYCEMIA 11/28/2009  . HYPERTENSION 01/28/2008  . INSOMNIA  05/10/2008  . Irritable bowel syndrome 04/06/2010  . OSTEOARTHRITIS 11/28/2009  . OSTEOPOROSIS 04/14/2007  . 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 SINUSITIS 02/10/2009    Past Surgical History:  Procedure Laterality Date  . COLONOSCOPY W/ POLYPECTOMY    . ELECTROCARDIOGRAM  12/04/2006  . LAPAROSCOPY    . NASAL SEPTUM SURGERY    . Stress Cardiolite  02/13/2002    Review of systems negative except as noted in HPI / PMHx or noted below:  Review of Systems  Constitutional: Negative.   HENT: Negative.   Eyes: Negative.   Respiratory: Negative.   Cardiovascular: Negative.   Gastrointestinal: Negative.   Genitourinary: Negative.   Musculoskeletal: Negative.   Skin: Negative.   Neurological: Negative.   Endo/Heme/Allergies: Negative.   Psychiatric/Behavioral: Negative.      Objective:   Vitals:   12/04/17 1033  BP: 130/70  Pulse: 68  Resp: 18  SpO2: 98%          Physical Exam  Constitutional: She is well-developed, well-nourished, and in no distress.  Allergic shiners  HENT:  Head: Normocephalic.  Right Ear: Tympanic membrane, external ear and ear canal normal.  Left Ear: Tympanic membrane, external ear and ear canal normal.  Nose: Nose normal. No mucosal edema or rhinorrhea.  Mouth/Throat: Uvula is midline, oropharynx is clear and moist and mucous membranes are normal. No oropharyngeal exudate.  Eyes: Conjunctivae are normal.  Neck: Trachea normal. No tracheal tenderness present. No tracheal deviation present. No thyromegaly present.  Cardiovascular: Normal rate, regular rhythm, S1 normal, S2 normal and normal heart sounds.  No murmur heard. Pulmonary/Chest: Breath sounds normal. No stridor. No respiratory distress. She has no wheezes. She has no rales.  Musculoskeletal: She exhibits no edema.  Lymphadenopathy:       Head (right side): No tonsillar adenopathy present.       Head (left side): No tonsillar adenopathy present.    She has no cervical adenopathy.    Neurological: She is alert. Gait normal.  Skin: No rash noted. She is not diaphoretic. No erythema. Nails show no clubbing.  Psychiatric: Mood and affect normal.    Diagnostics:    Spirometry was performed and demonstrated an FEV1 of 1.71 at 108 % of predicted.  The patient had an Asthma Control Test with the following results: ACT Total Score: 20.    Results of rhinoscopy performed by Dr. Wilburn Cornelia onto February 2019 identified the following:  "Laryngoscopy performed today shows no evidence of laryngeal pathology, vocal cord nodule or abnormal vocal cord movements. She does have moderate post-glottic erythema consistent with reflux".  Assessment and Plan:   1. Asthma, moderate persistent, well-controlled   2. Other allergic rhinitis   3. LPRD (laryngopharyngeal reflux disease)  1. Continue to Treat and prevent inflammation:   A. Qnasl 80 - 1-2 sprays each nostril one time per day  B. montelukast 10 mg - one tablet one time per day  C. Symbicort 160 two inhalation two times per day with spacer  2. Continue to Treat and prevent reflux:   A. Continue off caffeine and chocolate   B. Pantoprazole 40 mg - one tablet one time per day in AM  C. Ranitidine 300 mg one tablet one time per day    3. If needed:   A. ProAir HFA 2 puffs every 4-6 hours  B. nasal saline wash  C. OTC antihistamine - Claritin/Zyrtec/Allegra   D. Pataday one drop each eye one time per day   E. Mucinex DM   4. "Action Plan" for flare up:   A. Add Asmanex 220 Twisthaler 2 inhalations to Symbicort twice a day  B. Use Proair Hfa if needed  5. Check blood - area 2 aeroallergen profile. Immunotherapy?  6. Return to clinic in 3 months or earlier if problem   Ailyne appears to be doing okay on a very large collection of medical therapy directed against respiratory tract inflammation and reflux.  Some of her symptoms certainly do sound atopic and although she did not demonstrate any hypersensitivity  against a panel of aeroallergens on previous skin testing we will step ahead and have her obtain blood test looking for antigen specific IgE antibodies to see if she is a candidate for immunotherapy.  For now she will continue on all of her medications directed against respiratory tract inflammation and reflux as noted above.  I will see her back in this clinic in 3 months or earlier if there is a problem.  Allena Katz, MD Allergy / Immunology [place]

## 2017-12-05 ENCOUNTER — Encounter: Payer: Self-pay | Admitting: Allergy and Immunology

## 2017-12-09 ENCOUNTER — Telehealth: Payer: Self-pay | Admitting: Allergy and Immunology

## 2017-12-09 LAB — ALLERGENS W/TOTAL IGE AREA 2

## 2017-12-09 LAB — TRANSFERRIN SATURATION
IRON SATN MFR SERPL: 20 % Saturation
IRON SERPL-MCNC: 67 ug/dL
TRANSFERRIN SERPL-MCNC: 245 mg/dL

## 2017-12-09 NOTE — Telephone Encounter (Signed)
Test has been completed and resulted. Unable to cancel at this time. Dr. Neldon Mc is aware.

## 2017-12-09 NOTE — Telephone Encounter (Signed)
Please cancel this test.

## 2017-12-09 NOTE — Telephone Encounter (Addendum)
I will notify [facility] that this test was an error and make sure the patient does not get billed.

## 2017-12-09 NOTE — Telephone Encounter (Signed)
-----   Message from Jiles Prows, MD sent at 12/09/2017  8:28 AM EDT ----- Why does patient have a blood test for transferrin saturation?

## 2017-12-09 NOTE — Telephone Encounter (Signed)
I have reviewed all notes and contacts from patient. I do not know why this was done. It does show that I signed it, but I do not remember entering this lab.

## 2017-12-30 ENCOUNTER — Ambulatory Visit (INDEPENDENT_AMBULATORY_CARE_PROVIDER_SITE_OTHER): Payer: Medicare Other | Admitting: Family Medicine

## 2017-12-30 ENCOUNTER — Encounter: Payer: Self-pay | Admitting: Family Medicine

## 2017-12-30 VITALS — BP 142/60 | HR 66 | Temp 98.3°F | Ht 62.0 in | Wt 137.5 lb

## 2017-12-30 DIAGNOSIS — R079 Chest pain, unspecified: Secondary | ICD-10-CM

## 2017-12-30 DIAGNOSIS — Z7189 Other specified counseling: Secondary | ICD-10-CM | POA: Diagnosis not present

## 2017-12-30 DIAGNOSIS — R21 Rash and other nonspecific skin eruption: Secondary | ICD-10-CM | POA: Diagnosis not present

## 2017-12-30 DIAGNOSIS — H40003 Preglaucoma, unspecified, bilateral: Secondary | ICD-10-CM | POA: Diagnosis not present

## 2017-12-30 DIAGNOSIS — K219 Gastro-esophageal reflux disease without esophagitis: Secondary | ICD-10-CM

## 2017-12-30 DIAGNOSIS — Z1211 Encounter for screening for malignant neoplasm of colon: Secondary | ICD-10-CM | POA: Diagnosis not present

## 2017-12-30 NOTE — Patient Instructions (Addendum)
Recheck in about 6 months with a yearly visit.  Take care.  Glad to see you.  Don't change your meds for now.   Update me as needed.   We will call about your referral.  Rosaria Ferries or Azalee Course will call you if you don't see one of them on the way out.

## 2017-12-30 NOTE — Progress Notes (Signed)
Hypertension:    Using medication without problems or lightheadedness: no Chest pain with exertion:yes Edema: occ mild BLE edema.   Short of breath:yes, occ D/w pt about losartan recall and affected lots, etc.  She checked with pharmacy and didn't have affected/recalled lots.   Statin intolerant.  Aches got better off statin.    Normal bone density prev done.  D/w pt.   Colon cancer screening done last year with [facility] and 3 year f/u per patient report.    Daughter Colletta [place] designated if patient were incapacitated.    She has seen rheum about fibromyalgia.    She has seen ENT and asthma about LPR.  She avoids trigger foods, ie sweets.  D/w pt.  She is still on PPI.    Meds, vitals, and allergies reviewed.   PMH and SH reviewed  ROS: Per HPI unless specifically indicated in ROS section   GEN: nad, alert and oriented HEENT: mucous membranes moist NECK: supple w/o LA CV: rrr. PULM: ctab, no inc wob ABD: soft, +bs EXT: no edema SKIN: no acute rash but Faint changes with possible ringworm on the R shin noted.  She has an itching/burning sensation locally.

## 2017-12-31 ENCOUNTER — Ambulatory Visit: Payer: Medicare Other | Admitting: Cardiology

## 2018-01-02 DIAGNOSIS — Z7189 Other specified counseling: Secondary | ICD-10-CM | POA: Insufficient documentation

## 2018-01-02 DIAGNOSIS — Z1211 Encounter for screening for malignant neoplasm of colon: Secondary | ICD-10-CM | POA: Insufficient documentation

## 2018-01-02 NOTE — Assessment & Plan Note (Signed)
Daughter Colletta [place] designated if patient were incapacitated.

## 2018-01-02 NOTE — Assessment & Plan Note (Signed)
Okay to try over-the-counter topical antifungal and update me as needed.

## 2018-01-02 NOTE — Assessment & Plan Note (Signed)
Colon cancer screening done 2018 per patient report with [facility] and 3 year f/u per patient report.  Requesting records.

## 2018-01-02 NOTE — Assessment & Plan Note (Signed)
EKG unremarkable.  No chest pain currently.  She has been having episodic exertional chest pain.  Routine cautions given.  Statin intolerant.  No change in meds at this point.  Refer to cardiology.  Routine emergency room cautions given.  She agrees to plan. >25 minutes spent in face to face time with patient, >50% spent in counselling or coordination of care.

## 2018-01-02 NOTE — Assessment & Plan Note (Signed)
She has seen ENT and asthma about LPR.  She avoids trigger foods, ie sweets.  D/w pt.  She is still on PPI.

## 2018-01-07 ENCOUNTER — Ambulatory Visit (INDEPENDENT_AMBULATORY_CARE_PROVIDER_SITE_OTHER): Payer: Medicare Other | Admitting: Family Medicine

## 2018-01-07 ENCOUNTER — Encounter: Payer: Self-pay | Admitting: Family Medicine

## 2018-01-07 DIAGNOSIS — J0100 Acute maxillary sinusitis, unspecified: Secondary | ICD-10-CM

## 2018-01-07 MED ORDER — PREDNISONE 20 MG PO TABS: 20.0000 mg | ORAL_TABLET | Freq: Every day | ORAL | 0 refills | Status: DC

## 2018-01-07 MED ORDER — BENZONATATE 100 MG PO CAPS: 100.0000 mg | ORAL_CAPSULE | Freq: Three times a day (TID) | ORAL | 1 refills | Status: DC | PRN

## 2018-01-07 MED ORDER — DOXYCYCLINE HYCLATE 100 MG PO TABS: 100.0000 mg | ORAL_TABLET | Freq: Two times a day (BID) | ORAL | 0 refills | Status: DC

## 2018-01-07 NOTE — Progress Notes (Signed)
She has f/u with cardiology pending.   Sx started about 6 days ago. Initially with chest congestion.  Then sig ST and throat irritation.  Use baseline meds and gargling with salt water.  Sounds stuffy.  Cough, some sputum.  Thick sputum.  Facial pain, B maxillary.  Discolored rhinorrhea. Ear pressure, R>L.  No vomiting.  No diarrhea.  tmax 99.3 yesterday.  No wheeze.    Meds, vitals, and allergies reviewed.   ROS: Per HPI unless specifically indicated in ROS section   GEN: nad, alert and oriented HEENT: mucous membranes moist, tm w/o erythema, nasal exam w/o erythema, clear discharge noted,  OP wnl, R max sinus ttp  NECK: supple w/o LA CV: rrr.   PULM: ctab, no inc wob EXT: no edema SKIN: no acute rash

## 2018-01-07 NOTE — Patient Instructions (Addendum)
Start prednisone with food.  If not better in a few days, then add on doxycycline.  Keep using your regular meds.  Take care.  Glad to see you.  Update [place] as needed.   Tessalon as needed for cough.

## 2018-01-08 ENCOUNTER — Encounter: Payer: Self-pay | Admitting: Gastroenterology

## 2018-01-08 NOTE — Assessment & Plan Note (Signed)
Okay for outpatient f/u.  Start prednisone with food.  If not better in a few days, then add on doxycycline.  Keep using regular meds.  Update [place] as needed.   Tessalon as needed for cough.   She agrees.

## 2018-01-10 ENCOUNTER — Telehealth: Payer: Self-pay

## 2018-01-10 MED ORDER — AMOXICILLIN-POT CLAVULANATE 875-125 MG PO TABS: 1.0000 | ORAL_TABLET | Freq: Two times a day (BID) | ORAL | 0 refills | Status: DC

## 2018-01-10 NOTE — Telephone Encounter (Signed)
Copied from [place] [phone]. Topic: General - Other >> Jan 10, 2018  3:54 PM Carolyn Stare wrote:  Pt said the below med was called in for her but this med make her itch and she need to have Augmentim Amoxicill   doxycycline (VIBRA-TABS) 100 MG tablet  Pharmacy [facility] [place]

## 2018-01-10 NOTE — Telephone Encounter (Signed)
Sent.  She has possible reaction to cefuroxime.  This could possibly cross react but she may be able to tolerate it.  If she had itching on doxy then this is a reasonable change.  rx sent.

## 2018-01-10 NOTE — Telephone Encounter (Signed)
Patient advised.

## 2018-01-14 ENCOUNTER — Encounter: Payer: Self-pay | Admitting: Allergy and Immunology

## 2018-01-14 ENCOUNTER — Ambulatory Visit (INDEPENDENT_AMBULATORY_CARE_PROVIDER_SITE_OTHER): Payer: Medicare Other | Admitting: Allergy and Immunology

## 2018-01-14 VITALS — BP 152/58 | HR 68 | Temp 98.2°F | Resp 22

## 2018-01-14 DIAGNOSIS — J3089 Other allergic rhinitis: Secondary | ICD-10-CM | POA: Diagnosis not present

## 2018-01-14 DIAGNOSIS — J4541 Moderate persistent asthma with (acute) exacerbation: Secondary | ICD-10-CM | POA: Diagnosis not present

## 2018-01-14 DIAGNOSIS — K219 Gastro-esophageal reflux disease without esophagitis: Secondary | ICD-10-CM | POA: Diagnosis not present

## 2018-01-14 DIAGNOSIS — B999 Unspecified infectious disease: Secondary | ICD-10-CM

## 2018-01-14 MED ORDER — METHYLPREDNISOLONE ACETATE 80 MG/ML IJ SUSP
80.0000 mg | Freq: Once | INTRAMUSCULAR | Status: AC
  Administered 2018-01-14: 80 mg via INTRAMUSCULAR

## 2018-01-14 MED ORDER — HYDROCOD POLST-CPM POLST ER 10-8 MG/5ML PO SUER: 5.0000 mL | Freq: Two times a day (BID) | ORAL | 0 refills | Status: DC | PRN

## 2018-01-14 NOTE — Patient Instructions (Addendum)
  1. Continue to Treat and prevent inflammation:   A. Qnasl 80 - 1-2 sprays each nostril one time per day  B. montelukast 10 mg - one tablet one time per day  C. Symbicort 160 two inhalation two times per day with spacer  2. Continue to Treat and prevent reflux:   A. Continue off caffeine and chocolate   B. Pantoprazole 40 mg - one tablet one time per day in AM  C. Ranitidine 300 mg one tablet one time per day    3. If needed:   A. ProAir HFA 2 puffs every 4-6 hours  B. nasal saline wash  C. OTC antihistamine - Claritin/Zyrtec/Allegra   D. Pataday one drop each eye one time per day   E. Mucinex DM   4. "Action Plan" for flare up:   A. Add Asmanex 220 Twisthaler 2 inhalations to Symbicort twice a day  B. Use Proair Hfa if needed  5. Blood - IgA/G/M, antipneumococcal ab, anti-tetanus ab.  6. Depomedrol 80 IM delivered in clinic today  7. Continue Augmentin  8. Can use Tussionex 2.5/5 mL's every 12 hours as needed for cough.  Narcotic warning.  60 mL's only no refill  9. Return to clinic in 12 weeks or earlier if problem

## 2018-01-14 NOTE — Progress Notes (Signed)
Follow-up Note  Referring Provider: Lucille Passy, MD Primary Provider: Lucille Passy, MD Date of Office Visit: 01/14/2018  Subjective:   Samantha [name] (DOB: September 22, 1945) is a 72 y.o. female who returns to the [facility] [place] [facility] on 01/14/2018 in re-evaluation of the following:  HPI: Samantha [name] presents to this clinic in reevaluation of asthma and rhinitis and LPR.  Her last visit to this clinic was 04 December 2017.  Unfortunately, for the past 2 weeks she has developed very significant issues with head congestion and ugly nasal discharge and coughing and wheezing for which she was initially treated with systemic steroids approximately 7 days ago and subsequently treated with doxycycline by her primary care doctor.  Unfortunately, on her third dose of doxycycline she developed a skin rash and thus this medication was discontinued and she has started Augmentin yesterday.  She did have a fever this past weekend up to 100.8.  She has been around her [place] who has been sick.  Prior to this point she was doing relatively well although she always had a little bit of a cough.  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  Rarely does use a short acting bronchodilator.  Allergies as of 01/14/2018      Reactions   Cefuroxime Axetil    REACTION: pt not sure of reaction   Doxycycline Itching   Lovastatin Other (See Comments)   Aches   Sulfonamide Derivatives    REACTION: pt not sure of reaction      Medication List      albuterol 108 (90 Base) MCG/ACT inhaler Commonly known as:  PROVENTIL HFA Inhale 2 puffs into the lungs every 6 (six) hours as needed.   amoxicillin-clavulanate 875-125 MG tablet Commonly known as:  AUGMENTIN Take 1 tablet by mouth 2 (two) times  daily.   Beclomethasone Dipropionate 80 MCG/ACT Aers Commonly known as:  QNASL Two sprays into both nostrils daily   benzonatate 100 MG capsule Commonly known as:  TESSALON Take 1 capsule (100 mg total) by mouth 3 (three) times daily as needed for cough.   bifidobacterium infantis capsule Take 1 capsule by mouth daily.   budesonide-formoterol 160-4.5 MCG/ACT inhaler Commonly known as:  SYMBICORT Inhale 2 puffs into the lungs 2 (two) times daily.   cetirizine 10 MG tablet Commonly known as:  ZYRTEC Take 10 mg by mouth daily.   chlorpheniramine-HYDROcodone 10-8 MG/5ML Suer Commonly known as:  TUSSIONEX PENNKINETIC ER Take 5 mLs by mouth every 12 (twelve) hours as needed for cough.   Coenzyme Q-10 60 MG Caps Take 1 capsule by mouth 2 (two) times daily.   losartan-hydrochlorothiazide 100-12.5 MG tablet Commonly known as:  HYZAAR Take 1 tablet by mouth daily.   Magnesium Malate Powd (1250mg  tabs) 1 tablet by mouth two times a day   Manganese 50 MG Tabs Take 50 mg by mouth.   mometasone 220 MCG/INH inhaler Commonly known as:  ASMANEX Inhale 1 puff into the lungs daily.   montelukast 10 MG tablet Commonly known as:  SINGULAIR Take 1 tablet (10 mg total) by mouth daily.   Olopatadine HCl 0.2 % Soln Commonly known as:  PATADAY Place 1 drop into both eyes daily.   OMEGA 3 PO Take one tablet daily   pantoprazole 40 MG  tablet Commonly known as:  PROTONIX Take 1 tablet (40 mg total) by mouth daily.   predniSONE 20 MG tablet Commonly known as:  DELTASONE Take 1 tablet (20 mg total) by mouth daily with breakfast.   ranitidine 300 MG tablet Commonly known as:  ZANTAC Take 1 tablet (300 mg total) by mouth at bedtime.   TART CHERRY ADVANCED PO Take by mouth.   thiamine 100 MG tablet Take 100 mg by mouth daily.   TURMERIC PO Take by mouth daily.   Vitamin B-12 1000 MCG Subl Place 1 tablet under the tongue daily. Contains Folic Acid, B6, and Biotin   vitamin C  500 MG tablet Commonly known as:  ASCORBIC ACID Take 500 mg by mouth daily.       Past Medical History:  Diagnosis Date  . ALLERGIC RHINITIS 07/26/2008  . ASTHMA 04/14/2007  . Asthma   . ASYMPTOMATIC POSTMENOPAUSAL STATUS 05/10/2008  . CHEST PAIN 08/26/2008  . COLONIC POLYPS, HX OF 04/14/2007   colonic leiomyoma  . Episcleritis   . FIBROIDS, UTERUS 05/10/2008  . Fibromyalgia   . GERD 04/14/2007  . HYPERCHOLESTEROLEMIA 01/07/2008  . HYPERGLYCEMIA 11/28/2009  . HYPERTENSION 01/28/2008  . INSOMNIA 05/10/2008  . Irritable bowel syndrome 04/06/2010  . OSTEOARTHRITIS 11/28/2009  . 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 SINUSITIS 02/10/2009    Past Surgical History:  Procedure Laterality Date  . COLONOSCOPY W/ POLYPECTOMY    . ELECTROCARDIOGRAM  12/04/2006  . LAPAROSCOPY    . NASAL SEPTUM SURGERY    . Stress Cardiolite  02/13/2002    Review of systems negative except as noted in HPI / PMHx or noted below:  Review of Systems  Constitutional: Negative.   HENT: Negative.   Eyes: Negative.   Respiratory: Negative.   Cardiovascular: Negative.   Gastrointestinal: Negative.   Genitourinary: Negative.   Musculoskeletal: Negative.   Skin: Negative.   Neurological: Negative.   Endo/Heme/Allergies: Negative.   Psychiatric/Behavioral: Negative.      Objective:   Vitals:   01/14/18 1130  BP: (!) 152/58  Pulse: 68  Resp: (!) 22  Temp: 98.2 F (36.8 C)          Physical Exam  Constitutional:  Nasal voice, coughing  HENT:  Head: Normocephalic.  Right Ear: Tympanic membrane, external ear and ear canal normal.  Left Ear: Tympanic membrane, external ear and ear canal normal.  Nose: Mucosal edema (Erythematous) present. No rhinorrhea.  Mouth/Throat: Uvula is midline, oropharynx is clear and moist and mucous membranes are normal. No oropharyngeal exudate.  Eyes: Conjunctivae are normal.  Neck: Trachea normal. No tracheal tenderness present. No tracheal deviation present. No thyromegaly  present.  Cardiovascular: Normal rate, regular rhythm, S1 normal, S2 normal and normal heart sounds.  No murmur heard. Pulmonary/Chest: Breath sounds normal. No stridor. No respiratory distress. She has no wheezes. She has no rales.  Musculoskeletal: She exhibits no edema.  Lymphadenopathy:       Head (right side): No tonsillar adenopathy present.       Head (left side): No tonsillar adenopathy present.    She has no cervical adenopathy.  Neurological: She is alert.  Skin: No rash noted. She is not diaphoretic. No erythema. Nails show no clubbing.    Diagnostics:    Spirometry was performed and demonstrated an FEV1 of 1.72 at 108 % of predicted.  Results of blood tests obtained 03 July 2017 identified WBC 10.3, absolute eosinophil 100, absolute lymphocyte 3100, hemoglobin 13.4, platelet 194, no IgE antibodies directed against  various aeroallergens on a area to aero allergen profile, serum IgE 12 IU/mL  Assessment and Plan:   1. Asthma, not well controlled, moderate persistent, with acute exacerbation   2. Other allergic rhinitis   3. LPRD (laryngopharyngeal reflux disease)   4. Recurrent infections     1. Continue to Treat and prevent inflammation:   A. Qnasl 80 - 1-2 sprays each nostril one time per day  B. montelukast 10 mg - one tablet one time per day  C. Symbicort 160 two inhalation two times per day with spacer  2. Continue to Treat and prevent reflux:   A. Continue off caffeine and chocolate   B. Pantoprazole 40 mg - one tablet one time per day in AM  C. Ranitidine 300 mg one tablet one time per day    3. If needed:   A. ProAir HFA 2 puffs every 4-6 hours  B. nasal saline wash  C. OTC antihistamine - Claritin/Zyrtec/Allegra   D. Pataday one drop each eye one time per day   E. Mucinex DM   4. "Action Plan" for flare up:   A. Add Asmanex 220 Twisthaler 2 inhalations to Symbicort twice a day  B. Use Proair Hfa if needed  5. Blood - IgA/G/M, antipneumococcal  ab, anti-tetanus ab.  6. Depomedrol 80 IM delivered in clinic today  7. Continue Augmentin  8. Can use Tussionex 2.5/5 mL's every 12 hours as needed for cough.  Narcotic warning.  60 mL's only no refill  9. Return to clinic in 12 weeks or earlier if problem   Ruchama obviously has a another respiratory tract infection and she will continue to use Augmentin and I have given her a little bit more systemic steroid and I have given her a narcotic-based cough suppressant along with warnings about the effect of using narcotics.  She will use the least amount required to control her cough.  She will continue on a large collection of anti-inflammatory medications for her respiratory tract as well as therapy directed against reflux as noted above.  To be complete we will check her blood to make sure that we are not dealing with a less than adequate immune system concerning generation of antigen specific antibodies.  If she does well I will see her back in this clinic in 12 weeks or earlier if there is a problem.  Allena Katz, MD Allergy / Immunology [place]

## 2018-01-15 ENCOUNTER — Encounter: Payer: Self-pay | Admitting: Allergy and Immunology

## 2018-01-15 LAB — IGG, IGA, IGM
IgA/Immunoglobulin A, Serum: 216 mg/dL (ref 64–422)
IgG (Immunoglobin G), Serum: 1074 mg/dL (ref 700–1600)
IgM (Immunoglobulin M), Srm: 113 mg/dL (ref 26–217)

## 2018-01-21 ENCOUNTER — Other Ambulatory Visit: Payer: Self-pay

## 2018-01-21 ENCOUNTER — Telehealth: Payer: Self-pay | Admitting: Allergy and Immunology

## 2018-01-21 LAB — STREP PNEUMONIAE 23 SEROTYPES IGG
Pneumo Ab Type 1*: 9.7 ug/mL (ref >1.3)
Pneumo Ab Type 12 (12F)*: 3.2 ug/mL (ref >1.3)
Pneumo Ab Type 14*: 14.3 ug/mL (ref >1.3)
Pneumo Ab Type 17 (17F)*: 0.6 ug/mL — ABNORMAL LOW (ref >1.3)
Pneumo Ab Type 19 (19F)*: 5.4 ug/mL (ref >1.3)
Pneumo Ab Type 2*: 8.7 ug/mL (ref >1.3)
Pneumo Ab Type 20*: 1.3 ug/mL — ABNORMAL LOW (ref >1.3)
Pneumo Ab Type 22 (22F)*: 1.2 ug/mL — ABNORMAL LOW (ref >1.3)
Pneumo Ab Type 23 (23F)*: >14.4 ug/mL (ref >1.3)
Pneumo Ab Type 26 (6B)*: 2.9 ug/mL (ref >1.3)
Pneumo Ab Type 3*: 1.3 ug/mL — ABNORMAL LOW (ref >1.3)
Pneumo Ab Type 34 (10A)*: >30.8 ug/mL (ref >1.3)
Pneumo Ab Type 4*: 0.3 ug/mL — ABNORMAL LOW (ref >1.3)
Pneumo Ab Type 43 (11A)*: 10.8 ug/mL (ref >1.3)
Pneumo Ab Type 5*: 20.6 ug/mL (ref >1.3)
Pneumo Ab Type 51 (7F)*: >16.9 ug/mL (ref >1.3)
Pneumo Ab Type 54 (15B)*: 1.2 ug/mL — ABNORMAL LOW (ref >1.3)
Pneumo Ab Type 56 (18C)*: >14.7 ug/mL (ref >1.3)
Pneumo Ab Type 57 (19A)*: 2.6 ug/mL (ref >1.3)
Pneumo Ab Type 68 (9V)*: 1.2 ug/mL — ABNORMAL LOW (ref >1.3)
Pneumo Ab Type 70 (33F)*: 7.7 ug/mL (ref >1.3)
Pneumo Ab Type 8*: 0.2 ug/mL — ABNORMAL LOW (ref >1.3)
Pneumo Ab Type 9 (9N)*: 2.5 ug/mL (ref >1.3)

## 2018-01-21 MED ORDER — MOMETASONE FUROATE 220 MCG/INH IN AEPB: 1.0000 | INHALATION_SPRAY | Freq: Every day | RESPIRATORY_TRACT | 3 refills | Status: DC

## 2018-01-21 NOTE — Telephone Encounter (Signed)
Patient needs a refill on [facility] sent in to [facility] 90 day supply with 3 refills  Patient also wants to know if her lab results are in

## 2018-01-21 NOTE — Telephone Encounter (Signed)
rx sent in and kozlow has labs but hasn't looked at them let I informed pt

## 2018-01-30 ENCOUNTER — Encounter: Payer: Self-pay | Admitting: Internal Medicine

## 2018-01-30 ENCOUNTER — Ambulatory Visit (INDEPENDENT_AMBULATORY_CARE_PROVIDER_SITE_OTHER): Payer: Medicare Other | Admitting: Internal Medicine

## 2018-01-30 VITALS — BP 160/68 | HR 60 | Ht 62.0 in | Wt 139.0 lb

## 2018-01-30 DIAGNOSIS — K219 Gastro-esophageal reflux disease without esophagitis: Secondary | ICD-10-CM

## 2018-01-30 DIAGNOSIS — J4541 Moderate persistent asthma with (acute) exacerbation: Secondary | ICD-10-CM | POA: Diagnosis not present

## 2018-01-30 DIAGNOSIS — R0789 Other chest pain: Secondary | ICD-10-CM | POA: Diagnosis not present

## 2018-01-30 NOTE — Patient Instructions (Addendum)
Your physician has requested that you have an exercise tolerance test. For further information please visit [email or web address]. Please also follow instruction sheet, as given.  Your physician recommends that you schedule a follow-up appointment with Dr. Debara Pickett after your testing.

## 2018-01-30 NOTE — Progress Notes (Signed)
OFFICE CONSULT NOTE  Chief Complaint:  Chest pain  Primary Care Physician: Tonia Ghent, MD  HPI:  Samantha [name] is a 72 y.o. female who is being seen today for the evaluation of chest pain at the request of Damita Dunnings, Elveria Rising, MD.  This is a pleasant 72 year old female kindly referred for evaluation of chest pain.  She is followed by Dr. Damita Dunnings, her PCP.  Only she started with symptoms of chest congestion, sore throat and pressure in her chest.  Has a history of asthma and allergy and was thought to have recurrence.  She was treated with steroids and doxycycline.  Unfortunately she had a reaction to the doxycycline.  Ultimately she was seen by Dr. Neldon Mc, who provided Depo steroids and additional medications.  During this time she had chest discomfort.  However, she says prior to that she actually had some chest discomfort.  She had been noted to have elevated blood pressures which have been treated and was having some symptoms concerning for reflux.  She is on treatment for that.  Initially blood pressure was elevated today 160/68 however came down to 140/60.  The chest pain is described as achiness in the left chest.  It rotates toward the right chest and sometimes radiates to the back.  Is not worse with exertion or relieved by rest and is typically 4-5 out of 10 in intensity.  She reports these episodes were worse at night, after eating and associated with belching, another atypical symptom for angina.  PMHx:  Past Medical History:  Diagnosis Date  . ALLERGIC RHINITIS 07/26/2008  . ASTHMA 04/14/2007  . Asthma   . ASYMPTOMATIC POSTMENOPAUSAL STATUS 05/10/2008  . CHEST PAIN 08/26/2008  . COLONIC POLYPS, HX OF 04/14/2007   colonic leiomyoma  . Episcleritis   . FIBROIDS, UTERUS 05/10/2008  . Fibromyalgia   . GERD 04/14/2007  . HYPERCHOLESTEROLEMIA 01/07/2008  . HYPERGLYCEMIA 11/28/2009  . HYPERTENSION 01/28/2008  . INSOMNIA 05/10/2008  . Irritable bowel syndrome 04/06/2010  . OSTEOARTHRITIS  11/28/2009  . 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 SINUSITIS 02/10/2009    Past Surgical History:  Procedure Laterality Date  . COLONOSCOPY W/ POLYPECTOMY    . ELECTROCARDIOGRAM  12/04/2006  . LAPAROSCOPY    . NASAL SEPTUM SURGERY    . Stress Cardiolite  02/13/2002    FAMHx:  Family History  Problem Relation Age of Onset  . Heart disease Father        CHF  . Allergic rhinitis Father   . Asthma Father   . Allergic rhinitis Sister   . Asthma Sister   . Allergic rhinitis Brother   . Asthma Brother   . Allergic rhinitis Maternal Aunt   . Asthma Maternal Aunt   . Allergic rhinitis Paternal Aunt   . Asthma Paternal Aunt   . Breast cancer Cousin   . Cancer Neg Hx        No FH of Colon Cancer  . Angioedema Neg Hx   . Atopy Neg Hx   . Immunodeficiency Neg Hx   . Urticaria Neg Hx   . Eczema Neg Hx     SOCHx:   reports that she has never smoked. She has never used smokeless tobacco. She reports that she does not drink alcohol or use drugs.  ALLERGIES:  Allergies  Allergen Reactions  . Cefuroxime Axetil     REACTION: pt not sure of reaction  . Doxycycline Itching  . Lovastatin Other (See Comments)    Aches   .  Sulfonamide Derivatives     REACTION: pt not sure of reaction    ROS: Pertinent items noted in HPI and remainder of comprehensive ROS otherwise negative.  HOME MEDS: Current Outpatient Medications on File Prior to Visit  Medication Sig Dispense Refill  . albuterol (PROVENTIL HFA) 108 (90 Base) MCG/ACT inhaler Inhale 2 puffs into the lungs every 6 (six) hours as needed. 3 Inhaler 3  . Ascorbic Acid (VITAMIN C) 500 MG tablet Take 500 mg by mouth daily.      . Beclomethasone Dipropionate (QNASL) 80 MCG/ACT AERS Two sprays into both nostrils daily 1 Inhaler 5  . benzonatate (TESSALON) 100 MG capsule Take 1 capsule (100 mg total) by mouth 3 (three) times daily as needed for cough. 30 capsule 1  . bifidobacterium infantis (ALIGN) capsule Take 1 capsule by mouth daily.       . budesonide-formoterol (SYMBICORT) 160-4.5 MCG/ACT inhaler Inhale 2 puffs into the lungs 2 (two) times daily. 3 Inhaler 3  . cetirizine (ZYRTEC) 10 MG tablet Take 10 mg by mouth daily.    . Coenzyme Q-10 60 MG CAPS Take 1 capsule by mouth 2 (two) times daily.     . Cyanocobalamin (VITAMIN B-12) 1000 MCG SUBL Place 1 tablet under the tongue daily. Contains Folic Acid, B6, and Biotin    . losartan-hydrochlorothiazide (HYZAAR) 100-12.5 MG tablet Take 1 tablet by mouth daily.    . Magnesium Malate POWD (1250mg  tabs) 1 tablet by mouth two times a day    . Manganese 50 MG TABS Take 50 mg by mouth.    . Misc Natural Products (TART CHERRY ADVANCED PO) Take by mouth.    . mometasone (ASMANEX) 220 MCG/INH inhaler Inhale 1 puff into the lungs daily. 3 Inhaler 3  . montelukast (SINGULAIR) 10 MG tablet Take 1 tablet (10 mg total) by mouth daily. 90 tablet 3  . Olopatadine HCl (PATADAY) 0.2 % SOLN Place 1 drop into both eyes daily. 3 Bottle 3  . Omega-3 Fatty Acids (OMEGA 3 PO) Take one tablet daily    . pantoprazole (PROTONIX) 40 MG tablet Take 1 tablet (40 mg total) by mouth daily. 30 tablet 3  . ranitidine (ZANTAC) 300 MG tablet Take 1 tablet (300 mg total) by mouth at bedtime. 90 tablet 3  . thiamine 100 MG tablet Take 100 mg by mouth daily.      . TURMERIC PO Take by mouth daily.     No current facility-administered medications on file prior to visit.     LABS/IMAGING: No results found for this or any previous visit (from the past 48 hour(s)). No results found.  LIPID PANEL:    Component Value Date/Time   CHOL 197 07/03/2017 1350   TRIG 60.0 07/03/2017 1350   HDL 101.20 07/03/2017 1350   CHOLHDL 2 07/03/2017 1350   VLDL 12.0 07/03/2017 1350   LDLCALC 84 07/03/2017 1350    WEIGHTS: Wt Readings from Last 3 Encounters:  01/30/18 139 lb (63 kg)  01/07/18 135 lb 8 oz (61.5 kg)  12/30/17 137 lb 8 oz (62.4 kg)    VITALS: BP (!) 160/68   Pulse 60   Ht 5\' 2"  (1.575 m)   Wt 139 lb (63  kg)   BMI 25.42 kg/m   EXAM: General appearance: alert and no distress Neck: no carotid bruit, no JVD and thyroid not enlarged, symmetric, no tenderness/mass/nodules Lungs: clear to auscultation bilaterally Heart: regular rate and rhythm, S1, S2 normal, no murmur, click, rub or gallop Abdomen:  soft, non-tender; bowel sounds normal; no masses,  no organomegaly Extremities: extremities normal, atraumatic, no cyanosis or edema Pulses: 2+ and symmetric Skin: Skin color, texture, turgor normal. No rashes or lesions Neurologic: Grossly normal Psych: Pleasant  EKG: Normal sinus rhythm at 60- personally reviewed  ASSESSMENT: 1. Atypical chest pain 2. Recent acute asthma exacerbation 3. GERD with belching  PLAN: 1.   Samantha [name] is describing atypical chest pain.  She had recent acute asthma exacerbation and has been having atypical chest pain symptoms.  Some of her symptoms are more consistent with GERD including symptoms of belching.  She is on treatment for that.  Given her cardiac risk factors, I recommend an exercise treadmill stress test to rule out ischemia.  If this is negative, I would attribute her symptoms more to her underlying GI or pulmonary disease.  Next again for the kind referral.  Pixie Casino, MD, FACC, [place] Director of the [facility] Diplomate of the American Board of Clinical Lipidology Attending Cardiologist  Direct Dial: [phone]  Fax: [phone]  Website:  [email or web address] 01/30/2018, 3:57 PM

## 2018-02-04 ENCOUNTER — Encounter: Payer: Self-pay | Admitting: Family Medicine

## 2018-02-04 ENCOUNTER — Ambulatory Visit (INDEPENDENT_AMBULATORY_CARE_PROVIDER_SITE_OTHER): Payer: Medicare Other | Admitting: Family Medicine

## 2018-02-04 VITALS — BP 168/72 | HR 67 | Temp 98.1°F | Ht 62.0 in | Wt 136.5 lb

## 2018-02-04 DIAGNOSIS — N950 Postmenopausal bleeding: Secondary | ICD-10-CM | POA: Diagnosis not present

## 2018-02-04 DIAGNOSIS — R079 Chest pain, unspecified: Secondary | ICD-10-CM | POA: Diagnosis not present

## 2018-02-04 DIAGNOSIS — Z01419 Encounter for gynecological examination (general) (routine) without abnormal findings: Secondary | ICD-10-CM | POA: Diagnosis not present

## 2018-02-04 DIAGNOSIS — J4520 Mild intermittent asthma, uncomplicated: Secondary | ICD-10-CM

## 2018-02-04 DIAGNOSIS — Z6825 Body mass index (BMI) 25.0-25.9, adult: Secondary | ICD-10-CM | POA: Diagnosis not present

## 2018-02-04 DIAGNOSIS — R351 Nocturia: Secondary | ICD-10-CM | POA: Diagnosis not present

## 2018-02-04 DIAGNOSIS — I10 Essential (primary) hypertension: Secondary | ICD-10-CM

## 2018-02-04 DIAGNOSIS — I959 Hypotension, unspecified: Secondary | ICD-10-CM

## 2018-02-04 DIAGNOSIS — D259 Leiomyoma of uterus, unspecified: Secondary | ICD-10-CM | POA: Diagnosis not present

## 2018-02-04 DIAGNOSIS — N760 Acute vaginitis: Secondary | ICD-10-CM | POA: Diagnosis not present

## 2018-02-04 DIAGNOSIS — E559 Vitamin D deficiency, unspecified: Secondary | ICD-10-CM

## 2018-02-04 MED ORDER — LOSARTAN POTASSIUM 50 MG PO TABS: 75.0000 mg | ORAL_TABLET | Freq: Every day | ORAL | Status: DC

## 2018-02-04 NOTE — Progress Notes (Signed)
She saw cards and has cardiac testing pending.   D/w pt about her prev report of exertional chest pain.  She clearly reported a history of exertional chest pain.  She described atypical chest pain at cards OV with ETT pending.  I'll defer to cards.    She saw gyn clinic today.  BP elevation noted at gyn clinic.  She was still having HAs.  She reports being on 50mg  losartan, not 100/HCTZ12.5mg .  This was the first time she had mentioned this.  We always go over her list and provide a written copy at all visits.  Med list discussed in detail again, per routine.   Per gyn, plan for sonohysterogram since she had passed a small amount of VB.  I'll defer to gyn clinic.   She had allergy eval done with plan for PNA vaccine in the future, d/w pt.    She was asking about recheck Vit D level.  This can be done later.   PMH and SH reviewed  ROS: Per HPI unless specifically indicated in ROS section   Meds, vitals, and allergies reviewed.   GEN: nad, alert and oriented HEENT: mucous membranes moist NECK: supple w/o LA CV: rrr. PULM: ctab, no inc wob ABD: soft, +bs EXT: no edema SKIN: well perfused.

## 2018-02-04 NOTE — Patient Instructions (Signed)
Take losartan 50mg - 1 and 1/2 tabs per day.  Update me as needed if your BP isn't controlled.   Take care.  Glad to see you.  Get through the cardiac testing and then you can get the pneumonia shot.

## 2018-02-05 NOTE — Assessment & Plan Note (Signed)
Med list updated. >25 minutes spent in face to face time with patient, >50% spent in counselling or coordination of care.  Take losartan 50mg  tabs--- 1 and 1/2 tabs per day for total of 75mg .  See AVS.  She'll update me as needed if BP isn't controlled.

## 2018-02-05 NOTE — Assessment & Plan Note (Signed)
She can get repeat PNA vaccine in the future.  Would get through cardiac and gyn testing first, along with BP med adjustment.   D/w pt.

## 2018-02-06 NOTE — Assessment & Plan Note (Signed)
With h/o VB, per gyn clinic.  I'll defer.

## 2018-02-06 NOTE — Assessment & Plan Note (Signed)
Per cards.  I'll defer.   

## 2018-02-07 ENCOUNTER — Telehealth (HOSPITAL_COMMUNITY): Payer: Self-pay

## 2018-02-07 NOTE — Telephone Encounter (Signed)
Encounter complete. 

## 2018-02-12 ENCOUNTER — Ambulatory Visit (HOSPITAL_COMMUNITY)
Admission: RE | Admit: 2018-02-12 | Discharge: 2018-02-12 | Disposition: A | Discharge status: Home / Self Care | Payer: Medicare Other | Source: Ambulatory Visit | Attending: Cardiology | Admitting: Cardiology

## 2018-02-12 DIAGNOSIS — R0789 Other chest pain: Secondary | ICD-10-CM | POA: Diagnosis not present

## 2018-02-13 LAB — EXERCISE TOLERANCE TEST
Estimated workload: 5 METS
Exercise duration (min): 3 min
Exercise duration (sec): 23 s
MPHR: 148 {beats}/min
Peak HR: 155 {beats}/min
Percent HR: 104 %
RPE: 19
Rest HR: 65 {beats}/min

## 2018-02-17 ENCOUNTER — Telehealth: Payer: Self-pay | Admitting: Internal Medicine

## 2018-02-17 DIAGNOSIS — R079 Chest pain, unspecified: Secondary | ICD-10-CM

## 2018-02-17 DIAGNOSIS — R0789 Other chest pain: Secondary | ICD-10-CM

## 2018-02-17 DIAGNOSIS — R072 Precordial pain: Secondary | ICD-10-CM

## 2018-02-17 DIAGNOSIS — N950 Postmenopausal bleeding: Secondary | ICD-10-CM | POA: Diagnosis not present

## 2018-02-17 MED ORDER — METOPROLOL TARTRATE 50 MG PO TABS: ORAL_TABLET | ORAL | 0 refills | Status: DC

## 2018-02-17 NOTE — Telephone Encounter (Signed)
Notes recorded by Pixie Casino, MD on 02/14/2018 at 3:25 PM EDT Let's set her up for a coronary CT angiogram if she is agreeable to answer the question definitively - looks like HR around 60, which is good.  Dr. Lemmie Evens ------  Notes recorded by Pixie Casino, MD on 02/13/2018 at 10:55 AM EDT Non-diagnostic stress test - will consider further testing.  Dr. Lemmie Evens

## 2018-02-17 NOTE — Telephone Encounter (Signed)
Patient aware of results. She agrees w/plan for additional testing. Provided instructions over the phone and mailed them to her. Routed to PCP per her request. Staff message sent to pre-cert

## 2018-02-19 ENCOUNTER — Ambulatory Visit: Payer: Medicare Other | Admitting: Allergy and Immunology

## 2018-02-20 ENCOUNTER — Other Ambulatory Visit: Payer: Self-pay | Admitting: Family Medicine

## 2018-02-20 DIAGNOSIS — I10 Essential (primary) hypertension: Secondary | ICD-10-CM

## 2018-02-26 ENCOUNTER — Other Ambulatory Visit: Payer: Self-pay | Admitting: Gastroenterology

## 2018-02-26 DIAGNOSIS — R1011 Right upper quadrant pain: Secondary | ICD-10-CM | POA: Diagnosis not present

## 2018-02-26 DIAGNOSIS — K219 Gastro-esophageal reflux disease without esophagitis: Secondary | ICD-10-CM | POA: Diagnosis not present

## 2018-02-26 DIAGNOSIS — R634 Abnormal weight loss: Secondary | ICD-10-CM | POA: Diagnosis not present

## 2018-02-26 DIAGNOSIS — Z86010 Personal history of colonic polyps: Secondary | ICD-10-CM | POA: Diagnosis not present

## 2018-02-27 DIAGNOSIS — H40003 Preglaucoma, unspecified, bilateral: Secondary | ICD-10-CM | POA: Diagnosis not present

## 2018-03-10 ENCOUNTER — Ambulatory Visit
Admission: RE | Admit: 2018-03-10 | Discharge: 2018-03-10 | Disposition: A | Discharge status: Home / Self Care | Payer: Medicare Other | Source: Ambulatory Visit | Attending: Gastroenterology | Admitting: Gastroenterology

## 2018-03-10 DIAGNOSIS — K7689 Other specified diseases of liver: Secondary | ICD-10-CM | POA: Diagnosis not present

## 2018-03-10 DIAGNOSIS — R1011 Right upper quadrant pain: Secondary | ICD-10-CM

## 2018-03-31 ENCOUNTER — Telehealth: Payer: Self-pay | Admitting: Internal Medicine

## 2018-03-31 NOTE — Telephone Encounter (Signed)
Spoke with patient and she was concerned about having Cardiac CT with her recent liver findings. According to patient she has fatty liver deposits. Advised patient ok to proceed with CT as ordered

## 2018-03-31 NOTE — Telephone Encounter (Signed)
New Message   Patient is calling because she is having a CT scan done. She is having some issues with her liver. She wants to know if its still okay to have the scan, Please call to discuss.

## 2018-04-07 ENCOUNTER — Other Ambulatory Visit: Payer: Self-pay

## 2018-04-07 ENCOUNTER — Telehealth: Payer: Self-pay | Admitting: Allergy and Immunology

## 2018-04-07 MED ORDER — BUDESONIDE-FORMOTEROL FUMARATE 160-4.5 MCG/ACT IN AERO: 2.0000 | INHALATION_SPRAY | Freq: Two times a day (BID) | RESPIRATORY_TRACT | 0 refills | Status: DC

## 2018-04-07 NOTE — Telephone Encounter (Signed)
Sent in rx.

## 2018-04-07 NOTE — Telephone Encounter (Signed)
Pt called and need to have symbicort 90 day supply called into [facility]. [phone].

## 2018-04-09 ENCOUNTER — Ambulatory Visit: Payer: Medicare Other | Admitting: Allergy and Immunology

## 2018-04-14 ENCOUNTER — Telehealth: Payer: Self-pay | Admitting: Internal Medicine

## 2018-04-14 ENCOUNTER — Other Ambulatory Visit (INDEPENDENT_AMBULATORY_CARE_PROVIDER_SITE_OTHER): Payer: Medicare Other

## 2018-04-14 DIAGNOSIS — I10 Essential (primary) hypertension: Secondary | ICD-10-CM

## 2018-04-14 LAB — BASIC METABOLIC PANEL
BUN: 12 mg/dL (ref 6–23)
CO2: 28 mEq/L (ref 19–32)
Calcium: 9.8 mg/dL (ref 8.4–10.5)
Chloride: 104 mEq/L (ref 96–112)
Creatinine, Ser: 0.85 mg/dL (ref 0.40–1.20)
GFR: 84.39 mL/min (ref >60.00)
Glucose, Bld: 91 mg/dL (ref 70–99)
Potassium: 3.9 mEq/L (ref 3.5–5.1)
Sodium: 138 mEq/L (ref 135–145)

## 2018-04-14 NOTE — Telephone Encounter (Signed)
SPOKE WITH  Patient .  Patient states she had some question in regards to upcoming  CT angiogram.  question answered -  Wanted to know about taking metoprolol 50 mg  Day of test.  Question about  Procedures - using  Ntg, and dye . Both are mention of patient resource instruction sheet  in regards to test. RN  Answered question , offered an appointment  For  More details if needed. Patient states she has an appointment with Dr Damita Dunnings , primary. Patient states she received information regarding the test by phone by an nurse not by Dr Debara Pickett. RN answered and again offered appointment ,but patient states the question were answered and she was satisfied.

## 2018-04-14 NOTE — Telephone Encounter (Signed)
New Message        Patient has a procedure coming up on the 04/22/2018. Would calling concerning this matter, she has questions.

## 2018-04-15 MED ORDER — RANITIDINE HCL 300 MG PO TABS: 300.0000 mg | ORAL_TABLET | Freq: Every day | ORAL | 3 refills | Status: DC

## 2018-04-15 NOTE — Telephone Encounter (Signed)
Patient called back stating she needed ranitidine sent in as well as the symbicort to [facility] for 90 day supply with 3 refills

## 2018-04-15 NOTE — Telephone Encounter (Signed)
Prescription has been sent in as requested.

## 2018-04-17 ENCOUNTER — Encounter: Payer: Self-pay | Admitting: Family Medicine

## 2018-04-17 ENCOUNTER — Ambulatory Visit (INDEPENDENT_AMBULATORY_CARE_PROVIDER_SITE_OTHER): Payer: Medicare Other | Admitting: Family Medicine

## 2018-04-17 DIAGNOSIS — R079 Chest pain, unspecified: Secondary | ICD-10-CM

## 2018-04-17 NOTE — Patient Instructions (Signed)
I'll await the notes from cardiology.   Go with the instructions they gave you.  Your kidney function is fine.  Take care.  Glad to see you.  Update me as needed.

## 2018-04-20 NOTE — Assessment & Plan Note (Signed)
We talked about the rationale for previous testing and the rationale for follow-up testing along with recent labs and Toprol use, etc.  All questions answered.  It seems entirely reasonable to go ahead with the plan the cardiology has laid out.  I support the plan.  Discussed with patient.  All questions answered to the best of my ability.  I appreciate the help from cardiology and I will await the follow-up results. >25 minutes spent in face to face time with patient, >50% spent in counselling or coordination of care.

## 2018-04-20 NOTE — Progress Notes (Signed)
She came in to talk about her recent cardiovascular testing and the proposed plan.  She had exercise tolerance test done with plan coronary CT.   Recent basic metabolic panel was done and was normal.  Discussed with patient about contrast use, the rationale for use with CT angiogram, recent normal creatinine, etc.  She has me to send a copy of her labs to cardiology and I will do so.  Noted that she had poor exercise tolerance with hypertensive blood pressure response on stress test.  She did have chest pain during the test and there was nonspecific upsloping ST depression in the inferior leads, discussed with patient.  Chest pain-free currently.  Discussed with patient about use of metoprolol prior to CT angiogram.  She understood rationale.  PMH and SH reviewed  ROS: Per HPI unless specifically indicated in ROS section   Meds, vitals, and allergies reviewed.   GEN: nad, alert and oriented HEENT: mucous membranes moist NECK: supple w/o LA CV: rrr. PULM: ctab, no inc wob ABD: soft, +bs EXT: no edema SKIN: no acute rash

## 2018-04-21 ENCOUNTER — Other Ambulatory Visit: Payer: Self-pay | Admitting: Internal Medicine

## 2018-04-21 ENCOUNTER — Ambulatory Visit (HOSPITAL_COMMUNITY)
Admission: RE | Admit: 2018-04-21 | Discharge: 2018-04-21 | Disposition: A | Discharge status: Home / Self Care | Payer: Medicare Other | Source: Ambulatory Visit | Attending: Internal Medicine | Admitting: Internal Medicine

## 2018-04-21 ENCOUNTER — Encounter (HOSPITAL_COMMUNITY): Payer: Self-pay | Admitting: Interventional Radiology

## 2018-04-21 ENCOUNTER — Ambulatory Visit (HOSPITAL_COMMUNITY): Payer: Medicare Other

## 2018-04-21 DIAGNOSIS — I878 Other specified disorders of veins: Secondary | ICD-10-CM

## 2018-04-21 DIAGNOSIS — R072 Precordial pain: Secondary | ICD-10-CM

## 2018-04-21 DIAGNOSIS — R079 Chest pain, unspecified: Secondary | ICD-10-CM

## 2018-04-21 DIAGNOSIS — R0789 Other chest pain: Secondary | ICD-10-CM | POA: Diagnosis not present

## 2018-04-21 HISTORY — PX: IR US GUIDE VASC ACCESS LEFT: IMG2389

## 2018-04-21 HISTORY — PX: IR RADIOLOGY PERIPHERAL GUIDED IV START: IMG5598

## 2018-04-21 MED ORDER — IOPAMIDOL (ISOVUE-370) INJECTION 76%
INTRAVENOUS | Status: AC
  Administered 2018-04-21: 80 mL
  Filled 2018-04-21: qty 100

## 2018-04-21 MED ORDER — LIDOCAINE HCL 1 % IJ SOLN
INTRAMUSCULAR | Status: AC
  Filled 2018-04-21: qty 20

## 2018-04-21 MED ORDER — NITROGLYCERIN 0.4 MG SL SUBL
0.8000 mg | SUBLINGUAL_TABLET | Freq: Once | SUBLINGUAL | Status: AC
  Administered 2018-04-21: 0.8 mg via SUBLINGUAL
  Filled 2018-04-21: qty 25

## 2018-04-21 MED ORDER — NITROGLYCERIN 0.4 MG SL SUBL
SUBLINGUAL_TABLET | SUBLINGUAL | Status: AC
  Filled 2018-04-21: qty 2

## 2018-04-21 MED ORDER — LIDOCAINE HCL 1 % IJ SOLN
INTRAMUSCULAR | Status: DC | PRN
  Administered 2018-04-21: 2 mL

## 2018-04-21 NOTE — Procedures (Signed)
Interventional Radiology Procedure Note  Procedure: US guided venipuncture.  Complications: None Recommendations:  - Ok to use  Signed,  Dulcy Fanny. Earleen Newport, DO

## 2018-04-22 ENCOUNTER — Encounter: Payer: Self-pay | Admitting: Internal Medicine

## 2018-05-05 ENCOUNTER — Encounter

## 2018-05-05 ENCOUNTER — Encounter: Payer: Self-pay | Admitting: Internal Medicine

## 2018-05-05 ENCOUNTER — Ambulatory Visit (INDEPENDENT_AMBULATORY_CARE_PROVIDER_SITE_OTHER): Payer: Medicare Other | Admitting: Internal Medicine

## 2018-05-05 VITALS — Ht 62.0 in | Wt 138.5 lb

## 2018-05-05 DIAGNOSIS — K219 Gastro-esophageal reflux disease without esophagitis: Secondary | ICD-10-CM

## 2018-05-05 DIAGNOSIS — R0789 Other chest pain: Secondary | ICD-10-CM | POA: Diagnosis not present

## 2018-05-05 DIAGNOSIS — M797 Fibromyalgia: Secondary | ICD-10-CM

## 2018-05-05 NOTE — Patient Instructions (Signed)
Your physician recommends that you schedule a follow-up appointment as needed  

## 2018-05-05 NOTE — Progress Notes (Signed)
OFFICE CONSULT NOTE  Chief Complaint:  Follow-up coronary CT scan  Primary Care Physician: Tonia Ghent, MD  HPI:  Samantha [name] is a 72 y.o. female who is being seen today for the evaluation of chest pain at the request of Damita Dunnings, Elveria Rising, MD.  This is a pleasant 72 year old female kindly referred for evaluation of chest pain.  She is followed by Dr. Damita Dunnings, her PCP.  Only she started with symptoms of chest congestion, sore throat and pressure in her chest.  Has a history of asthma and allergy and was thought to have recurrence.  She was treated with steroids and doxycycline.  Unfortunately she had a reaction to the doxycycline.  Ultimately she was seen by Dr. Neldon Mc, who provided Depo steroids and additional medications.  During this time she had chest discomfort.  However, she says prior to that she actually had some chest discomfort.  She had been noted to have elevated blood pressures which have been treated and was having some symptoms concerning for reflux.  She is on treatment for that.  Initially blood pressure was elevated today 160/68 however came down to 140/60.  The chest pain is described as achiness in the left chest.  It rotates toward the right chest and sometimes radiates to the back.  Is not worse with exertion or relieved by rest and is typically 4-5 out of 10 in intensity.  She reports these episodes were worse at night, after eating and associated with belching, another atypical symptom for angina.  05/08/2018  Samantha [name] returns today for follow-up.  She still gets occasional chest discomfort.  She does have a history of fibromyalgia which is improved significantly with vitamin therapy.  She underwent CT coronary angiography which showed no coronary calcium and no obstructive coronary disease.  There were no extracardiac findings as well.  I shared those findings with her today as well as the very good low risk associated prognosis out to even greater than 10  years.   PMHx:  Past Medical History:  Diagnosis Date  . ALLERGIC RHINITIS 07/26/2008  . ASTHMA 04/14/2007  . Asthma   . ASYMPTOMATIC POSTMENOPAUSAL STATUS 05/10/2008  . CHEST PAIN 08/26/2008  . COLONIC POLYPS, HX OF 04/14/2007   colonic leiomyoma  . Episcleritis   . FIBROIDS, UTERUS 05/10/2008  . Fibromyalgia   . GERD 04/14/2007  . HYPERCHOLESTEROLEMIA 01/07/2008  . HYPERGLYCEMIA 11/28/2009  . HYPERTENSION 01/28/2008  . INSOMNIA 05/10/2008  . Irritable bowel syndrome 04/06/2010  . OSTEOARTHRITIS 11/28/2009  . 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 SINUSITIS 02/10/2009    Past Surgical History:  Procedure Laterality Date  . COLONOSCOPY W/ POLYPECTOMY    . ELECTROCARDIOGRAM  12/04/2006  . IR RADIOLOGY PERIPHERAL GUIDED IV START  04/21/2018  . IR US GUIDE VASC ACCESS LEFT  04/21/2018  . LAPAROSCOPY    . NASAL SEPTUM SURGERY    . Stress Cardiolite  02/13/2002    FAMHx:  Family History  Problem Relation Age of Onset  . Heart disease Father        CHF  . Allergic rhinitis Father   . Asthma Father   . Allergic rhinitis Sister   . Asthma Sister   . Allergic rhinitis Brother   . Asthma Brother   . Allergic rhinitis Maternal Aunt   . Asthma Maternal Aunt   . Allergic rhinitis Paternal Aunt   . Asthma Paternal Aunt   . Breast cancer Cousin   . Cancer Neg Hx  No FH of Colon Cancer  . Angioedema Neg Hx   . Atopy Neg Hx   . Immunodeficiency Neg Hx   . Urticaria Neg Hx   . Eczema Neg Hx     SOCHx:   reports that she has never smoked. She has never used smokeless tobacco. She reports that she does not drink alcohol or use drugs.  ALLERGIES:  Allergies  Allergen Reactions  . Cefuroxime Axetil     REACTION: pt not sure of reaction  . Doxycycline Itching  . Lovastatin Other (See Comments)    Aches   . Sulfonamide Derivatives     REACTION: pt not sure of reaction    ROS: Pertinent items noted in HPI and remainder of comprehensive ROS otherwise negative.  HOME  MEDS: Current Outpatient Medications on File Prior to Visit  Medication Sig Dispense Refill  . albuterol (PROVENTIL HFA) 108 (90 Base) MCG/ACT inhaler Inhale 2 puffs into the lungs every 6 (six) hours as needed. 3 Inhaler 3  . Ascorbic Acid (VITAMIN C) 500 MG tablet Take 500 mg by mouth daily.      . Beclomethasone Dipropionate (QNASL) 80 MCG/ACT AERS Two sprays into both nostrils daily 1 Inhaler 5  . benzonatate (TESSALON) 100 MG capsule Take 1 capsule (100 mg total) by mouth 3 (three) times daily as needed for cough. 30 capsule 1  . bifidobacterium infantis (ALIGN) capsule Take 1 capsule by mouth daily.      . budesonide-formoterol (SYMBICORT) 160-4.5 MCG/ACT inhaler Inhale 2 puffs into the lungs 2 (two) times daily. 30.6 Inhaler 0  . cetirizine (ZYRTEC) 10 MG tablet Take 10 mg by mouth daily.    . Coenzyme Q-10 60 MG CAPS Take 1 capsule by mouth 2 (two) times daily.     . Cyanocobalamin (VITAMIN B-12) 1000 MCG SUBL Place 1 tablet under the tongue daily. Contains Folic Acid, B6, and Biotin    . Losartan Potassium-HCTZ (HYZAAR PO) Take 50 mg by mouth. 1 1/2 Tablet    . Magnesium Malate POWD (1250mg  tabs) 1 tablet by mouth two times a day    . Manganese 50 MG TABS Take 50 mg by mouth.    . Misc Natural Products (TART CHERRY ADVANCED PO) Take by mouth.    . mometasone (ASMANEX) 220 MCG/INH inhaler Inhale 1 puff into the lungs daily. 3 Inhaler 3  . montelukast (SINGULAIR) 10 MG tablet Take 1 tablet (10 mg total) by mouth daily. 90 tablet 3  . Olopatadine HCl (PATADAY) 0.2 % SOLN Place 1 drop into both eyes daily. 3 Bottle 3  . Omega-3 Fatty Acids (OMEGA 3 PO) Take one tablet daily    . pantoprazole (PROTONIX) 40 MG tablet Take 1 tablet (40 mg total) by mouth daily. 30 tablet 3  . ranitidine (ZANTAC) 300 MG tablet Take 1 tablet (300 mg total) by mouth at bedtime. 90 tablet 3  . thiamine 100 MG tablet Take 100 mg by mouth daily.      . TURMERIC PO Take by mouth daily.     No current  facility-administered medications on file prior to visit.     LABS/IMAGING: No results found for this or any previous visit (from the past 48 hour(s)). No results found.  LIPID PANEL:    Component Value Date/Time   CHOL 197 07/03/2017 1350   TRIG 60.0 07/03/2017 1350   HDL 101.20 07/03/2017 1350   CHOLHDL 2 07/03/2017 1350   VLDL 12.0 07/03/2017 1350   LDLCALC 84 07/03/2017 1350  WEIGHTS: Wt Readings from Last 3 Encounters:  05/05/18 138 lb 8 oz (62.8 kg)  04/17/18 137 lb 12 oz (62.5 kg)  02/04/18 136 lb 8 oz (61.9 kg)    VITALS: Ht 5\' 2"  (1.575 m)   Wt 138 lb 8 oz (62.8 kg)   BMI 25.33 kg/m   EXAM: Deferred  EKG: Deferred  ASSESSMENT: 1. Atypical chest pain -Zero coronary calcium, no obstructive coronary disease by CT angiography (04/2018) 2. Recent acute asthma exacerbation 3. GERD with belching  PLAN: 1.   Samantha [name] had no evidence of coronary artery disease by CT coronary angiography.  I suspect her symptoms more related to fibromyalgia.  No further coronary work-up is necessary.  Her CT coronary angiography carries a very low risk prognosis out to greater than 10 years.  Follow-up as needed.  Pixie Casino, MD, [facility], [place] Director of the Advanced Lipid Disorders &  Cardiovascular Risk Reduction Clinic Diplomate of the American Board of Clinical Lipidology Attending Cardiologist  Direct Dial: [phone]  Fax: [phone]  Website:  [email or web address] Monday 05/05/2018, 9:17 AM

## 2018-05-12 DIAGNOSIS — J019 Acute sinusitis, unspecified: Secondary | ICD-10-CM | POA: Diagnosis not present

## 2018-05-12 DIAGNOSIS — I10 Essential (primary) hypertension: Secondary | ICD-10-CM | POA: Diagnosis not present

## 2018-05-19 ENCOUNTER — Encounter: Payer: Self-pay | Admitting: Family Medicine

## 2018-05-19 ENCOUNTER — Ambulatory Visit (INDEPENDENT_AMBULATORY_CARE_PROVIDER_SITE_OTHER): Payer: Medicare Other | Admitting: Family Medicine

## 2018-05-19 ENCOUNTER — Ambulatory Visit: Payer: Self-pay | Admitting: *Deleted

## 2018-05-19 VITALS — BP 170/68 | HR 71 | Temp 98.2°F | Ht 62.0 in | Wt 138.0 lb

## 2018-05-19 DIAGNOSIS — I10 Essential (primary) hypertension: Secondary | ICD-10-CM | POA: Diagnosis not present

## 2018-05-19 MED ORDER — LOSARTAN POTASSIUM 100 MG PO TABS: 100.0000 mg | ORAL_TABLET | Freq: Every day | ORAL | 3 refills | Status: DC

## 2018-05-19 NOTE — Telephone Encounter (Signed)
I spoke with pt and the BP in 70's/60's per P[facility] note; pt said she did not say that; today BP 156/63 and later rechecked BP 149/69. Pt has slight H/A today but not severe like the other day; no CP, dizziness or SOB. Pt has appt 05/19/18 at 3:30 with Dr Darnell Level. If pt condition changes or worsens prior to appt pt will go to ED. FYI to Dr Darnell Level.

## 2018-05-19 NOTE — Patient Instructions (Addendum)
Blood pressure is staying high - increase losartan to 2 tablets daily (100mg ). New dose (100mg ) at pharmacy.  Return in 2-3 weeks for recheck blood pressure with Dr Damita Dunnings.

## 2018-05-19 NOTE — Progress Notes (Signed)
BP (!) 170/68 (BP Location: Right Arm, Cuff Size: Normal)   Pulse 71   Temp 98.2 F (36.8 C) (Oral)   Ht 5\' 2"  (1.575 m)   Wt 138 lb (62.6 kg)   SpO2 98%   BMI 25.24 kg/m   With pt's home cuff she checked 186/73. On repeat 178/78 Orthostatic VS for the past 24 hrs (Last 3 readings):  BP- Lying BP- Standing at 0 minutes  05/19/18 1635 - 166/70  05/19/18 1633 160/74 -    CC: HTN Subjective:    Patient ID: Samantha [name], female    DOB: 07/12/1946, 72 y.o.   MRN: [id number]  HPI: Samantha [name] is a 72 y.o. female presenting on 05/19/2018 for Hypertension (C/o elevated BP readings. Started 2 wks ago. Pt brought in home BP monitor to compare. Office reading is 186/73. )   Brings BP cuff from home which was compared to our cuff.  Had bad headache 8/30 - checked BP and it was 160s/97. Last week seen at [facility] dx with sinus infection, treated with amoxicillin 875mg  bid x 10 days - still taking this. BP at that time 150/70s. At home BP running 170/60s.  Takes losartan 50mg  1.5 tablets daily.   Denies increased salt/sodium in diet - she has actually been limiting salt in diet. No increased canned foods or pork or bbq.   This morning BP 171/62, 165/57, 173/74.  BP always a bit higher on left arm.   Endorses blurred vision, CP/tightness, SOB, but no leg swelling.   Recent cardiac workup for atypical chest pain with zero coronary calcium score, no obstructive CAD by CT. Thought CP related to FM or GERD.  Known asthma managed with albuterol PRN and symbicort and singulair - takes asmanex + albuterol for flares.  Relevant past medical, surgical, family and social history reviewed and updated as indicated. Interim medical history since our last visit reviewed. Allergies and medications reviewed and updated. Outpatient Medications Prior to Visit  Medication Sig Dispense Refill  . albuterol (PROVENTIL HFA) 108 (90 Base) MCG/ACT inhaler Inhale 2 puffs into the lungs every 6 (six) hours as  needed. 3 Inhaler 3  . amoxicillin (AMOXIL) 875 MG tablet Take 875 mg by mouth 2 (two) times daily.  0  . Ascorbic Acid (VITAMIN C) 500 MG tablet Take 500 mg by mouth daily.      . Beclomethasone Dipropionate (QNASL) 80 MCG/ACT AERS Two sprays into both nostrils daily 1 Inhaler 5  . benzonatate (TESSALON) 100 MG capsule Take 1 capsule (100 mg total) by mouth 3 (three) times daily as needed for cough. 30 capsule 1  . bifidobacterium infantis (ALIGN) capsule Take 1 capsule by mouth daily.      . budesonide-formoterol (SYMBICORT) 160-4.5 MCG/ACT inhaler Inhale 2 puffs into the lungs 2 (two) times daily. 30.6 Inhaler 0  . cetirizine (ZYRTEC) 10 MG tablet Take 10 mg by mouth daily.    . Coenzyme Q-10 60 MG CAPS Take 1 capsule by mouth 2 (two) times daily.     . Cyanocobalamin (VITAMIN B-12) 1000 MCG SUBL Place 1 tablet under the tongue daily. Contains Folic Acid, B6, and Biotin    . Magnesium Malate POWD (1250mg  tabs) 1 tablet by mouth two times a day    . Manganese 50 MG TABS Take 50 mg by mouth.    . Misc Natural Products (TART CHERRY ADVANCED PO) Take by mouth.    . mometasone (ASMANEX) 220 MCG/INH inhaler Inhale 1 puff into the  lungs daily. 3 Inhaler 3  . montelukast (SINGULAIR) 10 MG tablet Take 1 tablet (10 mg total) by mouth daily. 90 tablet 3  . Olopatadine HCl (PATADAY) 0.2 % SOLN Place 1 drop into both eyes daily. 3 Bottle 3  . Omega-3 Fatty Acids (OMEGA 3 PO) Take one tablet daily    . pantoprazole (PROTONIX) 40 MG tablet Take 1 tablet (40 mg total) by mouth daily. 30 tablet 3  . ranitidine (ZANTAC) 300 MG tablet Take 1 tablet (300 mg total) by mouth at bedtime. 90 tablet 3  . thiamine 100 MG tablet Take 100 mg by mouth daily.      . TURMERIC PO Take by mouth daily.    . Losartan Potassium-HCTZ (HYZAAR PO) Take 50 mg by mouth. 1 1/2 Tablet    . losartan (COZAAR) 50 MG tablet Take 1.5 tablets (75 mg total) by mouth daily.     No facility-administered medications prior to visit.       Per HPI unless specifically indicated in ROS section below Review of Systems     Objective:    BP (!) 170/68 (BP Location: Right Arm, Cuff Size: Normal)   Pulse 71   Temp 98.2 F (36.8 C) (Oral)   Ht 5\' 2"  (1.575 m)   Wt 138 lb (62.6 kg)   SpO2 98%   BMI 25.24 kg/m   Wt Readings from Last 3 Encounters:  05/19/18 138 lb (62.6 kg)  05/05/18 138 lb 8 oz (62.8 kg)  04/17/18 137 lb 12 oz (62.5 kg)    Physical Exam  Constitutional: She appears well-developed and well-nourished. No distress.  HENT:  Head: Normocephalic and atraumatic.  Mouth/Throat: Oropharynx is clear and moist. No oropharyngeal exudate.  Neck: No thyromegaly present.  Cardiovascular: Normal rate, regular rhythm and normal heart sounds.  No murmur heard. Pulmonary/Chest: Effort normal and breath sounds normal. No respiratory distress. She has no wheezes. She has no rales.  Musculoskeletal: She exhibits no edema.  Nursing note and vitals reviewed.  Results for orders placed or performed in visit on 51/02/58  Basic metabolic panel  Result Value Ref Range   Sodium 138 135 - 145 mEq/L   Potassium 3.9 3.5 - 5.1 mEq/L   Chloride 104 96 - 112 mEq/L   CO2 28 19 - 32 mEq/L   Glucose, Bld 91 70 - 99 mg/dL   BUN 12 6 - 23 mg/dL   Creatinine, Ser 0.85 0.40 - 1.20 mg/dL   Calcium 9.8 8.4 - 10.5 mg/dL   GFR 84.39 >60.00 mL/min      Assessment & Plan:  Over 25 minutes were spent face-to-face with the patient during this encounter and >50% of that time was spent on counseling and coordination of care  Problem List Items Addressed This Visit    Essential hypertension - Primary    BP trending up over the past week. This correlates to recent dx and treatment for sinusitis with amoxicillin course by [facility]. Will increase losartan to 100mg  daily, have her f/u with PCP in 2-3 wks. Encouraged low sodium diet. Med rec performed, med list updated.       Relevant Medications   losartan (COZAAR) 100 MG tablet       Meds  ordered this encounter  Medications  . losartan (COZAAR) 100 MG tablet    Sig: Take 1 tablet (100 mg total) by mouth daily.    Dispense:  30 tablet    Refill:  3   No orders of the defined  types were placed in this encounter.   Follow up plan: Return in about 2 weeks (around 06/02/2018) for follow up visit.  Ria Bush, MD

## 2018-05-19 NOTE — Telephone Encounter (Addendum)
Pt called with complaints of her blood pressure; on 05/10/18 was 160's/97; she says she went to urgent care after drining vinegar water, but was told she had a sinus infections; her BP have been 170's/60's, and she also has missed no doses of her blood pressure medication;  BP today 05/19/18 171/62 at 0917 and 165/67 at 0920; on 05/16/18 it was 177/64 and 162/63 recommendations made per nurse triage protocol to include seeing a physician within 3 days; the pt normally sees Dr Damita Dunnings but she is willing to see another provider today because she has to pick her grandson up from play school; Dr Damita Dunnings has no availability today; the pt offered and accepted appointment with Dr Danise Mina, [place] today at 1530; the pt verbalizes understanding; will route to office for notification of this upcoming appointment.        Reason for Disposition . Systolic BP  >= 573 OR Diastolic >= 220  Answer Assessment - Initial Assessment Questions 1. BLOOD PRESSURE: "What is the blood pressure?" "Did you take at least two measurements 5 minutes apart?"     171/62 and 165/57; 177/64 and 162/63 on 05/16/18   2. ONSET: "When did you take your blood pressure?"    05/19/18 at [place] and 0920; on 05/16/18  3. HOW: "How did you obtain the blood pressure?" (e.g., visiting nurse, automatic home BP monitor)     Today's readings were taken with an automatic cuff left upper arm 4. HISTORY: "Do you have a history of high blood pressure?"     yes 5. MEDICATIONS: "Are you taking any medications for blood pressure?" "Have you missed any doses recently?"     no 6. OTHER SYMPTOMS: "Do you have any symptoms?" (e.g., headache, chest pain, blurred vision, difficulty breathing, weakness)     Dull headache, blurred vision since started on 05/09/18, chest tightness last night (better after taking inhaler last night) 7. PREGNANCY: "Is there any chance you are pregnant?" "When was your last menstrual period?"     no  Protocols used: HIGH BLOOD  PRESSURE-A-AH

## 2018-05-19 NOTE — Assessment & Plan Note (Addendum)
BP trending up over the past week. This correlates to recent dx and treatment for sinusitis with amoxicillin course by [facility]. Will increase losartan to 100mg  daily, have her f/u with PCP in 2-3 wks. Encouraged low sodium diet. Med rec performed, med list updated.

## 2018-05-22 ENCOUNTER — Telehealth: Payer: Self-pay | Admitting: Internal Medicine

## 2018-05-22 NOTE — Telephone Encounter (Signed)
New Message:    Pt had CT in August, she have had a headache and blood pressure been up high every since she had this test. Plt wonder if the dye that was used for CT might be causing this? Please call to advise.

## 2018-05-22 NOTE — Telephone Encounter (Signed)
Returned call to patient of Dr. Debara Pickett. She is concerned that her headache & blood pressure are related to the dye used for her CT test which was 1 month ago. She saw Dr. Danise Mina on 9/9 and he increased her losartan to 100mg . She sees PCP for BP f/up end of this month. Advised to monitor BP twice daily and if she does not notice a change in BP readings to notify PCP or our office. Informed her that we also have a HTN clinic where she can be evaluated as well.

## 2018-05-30 ENCOUNTER — Ambulatory Visit (INDEPENDENT_AMBULATORY_CARE_PROVIDER_SITE_OTHER): Payer: Medicare Other | Admitting: Family Medicine

## 2018-05-30 ENCOUNTER — Encounter: Payer: Self-pay | Admitting: Family Medicine

## 2018-05-30 VITALS — BP 156/62 | HR 72 | Temp 98.5°F | Ht 62.0 in | Wt 138.8 lb

## 2018-05-30 DIAGNOSIS — J0100 Acute maxillary sinusitis, unspecified: Secondary | ICD-10-CM

## 2018-05-30 MED ORDER — PREDNISONE 20 MG PO TABS: 20.0000 mg | ORAL_TABLET | Freq: Every day | ORAL | 0 refills | Status: DC

## 2018-05-30 MED ORDER — AMOXICILLIN-POT CLAVULANATE 875-125 MG PO TABS: 1.0000 | ORAL_TABLET | Freq: Two times a day (BID) | ORAL | 0 refills | Status: DC

## 2018-05-30 NOTE — Progress Notes (Signed)
Was seen at [facility] on Labor Day.  She had more HA/pain, episodically.  That prompted UC eval.  Done with amoxil course recently.  Seen by Dr. Darnell Level and ARB increased, on 100mg  losartan now.  BP is usually ~335O systolic recently.   She is done with amoxil but sx returned in the meantime with facial pain, stuffy, voice changes.  No sputum now.    ROS: Per HPI unless specifically indicated in ROS section   Meds, vitals, and allergies reviewed.   GEN: nad, alert and oriented HEENT: mucous membranes moist, tm w/o erythema, nasal exam w/o erythema, clear discharge noted,  OP with cobblestoning, max sinuses ttp B, frontal sinuses not ttp.   NECK: supple w/o LA CV: rrr.   PULM: ctab, no inc wob EXT: no edema SKIN: well perfused.

## 2018-05-30 NOTE — Patient Instructions (Addendum)
Start augmentin, twice a day.  If not better in a few days, then add on prednisone with food.  Update me if not better.  Take care.  Glad to see you.

## 2018-06-01 DIAGNOSIS — J019 Acute sinusitis, unspecified: Secondary | ICD-10-CM | POA: Insufficient documentation

## 2018-06-01 NOTE — Assessment & Plan Note (Signed)
Nontoxic.  Discussed with patient about options. Start augmentin, twice a day.  If not better in a few days, then add on prednisone with food.  Update me if not better.  Routine steroid cautions given.  She agrees.  Okay for outpatient follow-up.

## 2018-06-09 ENCOUNTER — Ambulatory Visit: Payer: Medicare Other | Admitting: Family Medicine

## 2018-06-11 DIAGNOSIS — Z23 Encounter for immunization: Secondary | ICD-10-CM | POA: Diagnosis not present

## 2018-07-07 ENCOUNTER — Other Ambulatory Visit: Payer: Self-pay | Admitting: Family Medicine

## 2018-07-07 ENCOUNTER — Ambulatory Visit: Payer: Medicare Other

## 2018-07-07 DIAGNOSIS — E559 Vitamin D deficiency, unspecified: Secondary | ICD-10-CM

## 2018-07-07 DIAGNOSIS — I10 Essential (primary) hypertension: Secondary | ICD-10-CM

## 2018-07-07 DIAGNOSIS — R739 Hyperglycemia, unspecified: Secondary | ICD-10-CM

## 2018-07-07 NOTE — Progress Notes (Signed)
Office Visit Note  Patient: Samantha [name]             Date of Birth: [birth year]           MRN: [id number]             PCP: Tonia Ghent, MD Referring: Tonia Ghent, MD Visit Date: 07/21/2018 Occupation: '@GUAROCC'$ @  Subjective:  Right index finger pain   History of Present Illness: Samantha [name] is a 72 y.o. female with history of fibromyalgia, Raynaud's, and osteoarthritis.  She states that a couple months ago she was causing her teeth and wrapped the floss around her right index finger and has had discomfort since.  She states that she is also noticed some diminished sensation and aching pain.  She states that the pain has been improving slightly.  She denies any change in color at this time.  She denies any joint swelling.  She denies any symptoms of Raynaud's recently.  She denies any digital ulcerations.  She states of bilateral knees have been doing well denies any joint pain or joint swelling at this time.  She denies any hip or groin pain recently.  She continues have generalized muscle aches muscle tenderness due to fibro myalgia.  She states that she has intermittent upper back as well as lower back pain.  She states that her fatigue has been stable overall.  She continues to have interrupted sleep at night but is typically not nap during the day.  He has been having increased thoracic pain which she describes in the mid thoracic region.   Activities of Daily Living:  Patient reports morning stiffness for 2  minutes.   Patient Reports nocturnal pain.  Difficulty dressing/grooming: Denies Difficulty climbing stairs: Reports Difficulty getting out of chair: Denies Difficulty using hands for taps, buttons, cutlery, and/or writing: Denies  Review of Systems  Constitutional: Positive for fatigue.  HENT: Positive for mouth dryness. Negative for mouth sores and nose dryness.   Eyes: Positive for dryness. Negative for pain and visual disturbance.  Respiratory: Negative for  cough, hemoptysis, shortness of breath and difficulty breathing.   Cardiovascular: Negative for chest pain, palpitations, hypertension and swelling in legs/feet.  Gastrointestinal: Negative for blood in stool, constipation and diarrhea.  Endocrine: Negative for increased urination.  Genitourinary: Negative for painful urination.  Musculoskeletal: Positive for arthralgias, joint pain, myalgias, morning stiffness, muscle tenderness and myalgias. Negative for joint swelling and muscle weakness.  Skin: Negative for color change, pallor, rash, hair loss, nodules/bumps, skin tightness, ulcers and sensitivity to sunlight.  Allergic/Immunologic: Negative for susceptible to infections.  Neurological: Negative for dizziness, numbness, headaches and weakness.  Hematological: Negative for swollen glands.  Psychiatric/Behavioral: Positive for sleep disturbance. Negative for depressed mood. The patient is nervous/anxious.     PMFS History:  Patient Active Problem List   Diagnosis Date Noted  . Healthcare maintenance 07/16/2018  . Moderate persistent asthma with acute exacerbation 01/30/2018  . Advance care planning 01/02/2018  . Vitamin D deficiency 07/08/2017  . 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/19/2017  . Primary osteoarthritis of both knees 01/19/2017  . Primary osteoarthritis of both feet 01/19/2017  . SOB (shortness of breath) 11/14/2016  . Diverticulosis 03/19/2016  . Rash 10/03/2015  . Lumbar compression fracture ([place]) 06/20/2015  . Fibromyalgia 02/26/2013  . Allergic rhinitis due to pollen 11/20/2010  . 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 IRRITABLE BOWEL SYNDROME 04/06/2010  . THYROID NODULE, RIGHT 12/07/2009  .  OSTEOARTHRITIS 11/28/2009  . FIBROIDS, UTERUS 05/10/2008  . Insomnia 05/10/2008  . ASYMPTOMATIC POSTMENOPAUSAL STATUS 05/10/2008  . Essential hypertension 01/28/2008  . HYPERCHOLESTEROLEMIA 01/07/2008  . GLAUCOMA 01/07/2008  .  Gastroesophageal reflux disease 04/14/2007  . Asthma 04/14/2007    Past Medical History:  Diagnosis Date  . ALLERGIC RHINITIS 07/26/2008  . ASTHMA 04/14/2007  . Asthma   . ASYMPTOMATIC POSTMENOPAUSAL STATUS 05/10/2008  . CHEST PAIN 08/26/2008  . COLONIC POLYPS, HX OF 04/14/2007   colonic leiomyoma  . Episcleritis   . FIBROIDS, UTERUS 05/10/2008  . Fibromyalgia   . GERD 04/14/2007  . HYPERCHOLESTEROLEMIA 01/07/2008  . HYPERGLYCEMIA 11/28/2009  . HYPERTENSION 01/28/2008  . INSOMNIA 05/10/2008  . Irritable bowel syndrome 04/06/2010  . OSTEOARTHRITIS 11/28/2009  . 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 SINUSITIS 02/10/2009    Family History  Problem Relation Age of Onset  . Heart disease Father        CHF  . Allergic rhinitis Father   . Asthma Father   . Allergic rhinitis Sister   . Asthma Sister   . Allergic rhinitis Brother   . Asthma Brother   . Allergic rhinitis Maternal Aunt   . Asthma Maternal Aunt   . Allergic rhinitis Paternal Aunt   . Asthma Paternal Aunt   . Breast cancer Cousin   . Colon cancer Cousin   . Cancer Neg Hx        No FH of Colon Cancer  . Angioedema Neg Hx   . Atopy Neg Hx   . Immunodeficiency Neg Hx   . Urticaria Neg Hx   . Eczema Neg Hx    Past Surgical History:  Procedure Laterality Date  . COLONOSCOPY W/ POLYPECTOMY    . ELECTROCARDIOGRAM  12/04/2006  . IR RADIOLOGY PERIPHERAL GUIDED IV START  04/21/2018  . IR US GUIDE VASC ACCESS LEFT  04/21/2018  . NASAL SEPTUM SURGERY    . Stress Cardiolite  02/13/2002   Social History   Social History Narrative   Lives alone (widowed 1998).   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      Does not have living will.  Does desire CPR.  Does not want life support for prolonged periods of time if futile.   2 sons and 1 daughter.      Objective: Vital Signs: BP 135/75 (BP Location: Right Arm, Patient Position: Sitting, Cuff Size: Normal)   Pulse 66   Resp 14   Ht 5' 1.7" (1.567 m)   Wt 135 lb 9.6 oz (61.5 kg)    BMI 25.04 kg/m    Physical Exam  Constitutional: She is oriented to person, place, and time. She appears well-developed and well-nourished.  HENT:  Head: Normocephalic and atraumatic.  Eyes: Conjunctivae and EOM are normal.  Neck: Normal range of motion.  Cardiovascular: Normal rate, regular rhythm, normal heart sounds and intact distal pulses.  Pulmonary/Chest: Effort normal and breath sounds normal.  Abdominal: Soft. Bowel sounds are normal.  Lymphadenopathy:    She has no cervical adenopathy.  Neurological: She is alert and oriented to person, place, and time.  Skin: Skin is warm and dry. Capillary refill takes less than 2 seconds.  Psychiatric: She has a normal mood and affect. Her behavior is normal.  Nursing note and vitals reviewed.    Musculoskeletal Exam: C-spine good range of motion.  She has mild thoracic kyphosis.  She has tenderness in the mid thoracic region around T7-T8.  Lumbar spine had limited range of  motion.  Shoulder joints elbow joints wrist joints were in good range of motion.  She has some DIP thickening of the bilateral second and fifth digits.  Hip joints knee joints ankles MTPs PIPs were in good range of motion with no synovitis.  She does have some generalized hyperalgesia.  CDAI Exam: CDAI Score: Not documented Patient Global Assessment: Not documented; Provider Global Assessment: Not documented Swollen: Not documented; Tender: Not documented Joint Exam   Not documented   There is currently no information documented on the homunculus. Go to the Rheumatology activity and complete the homunculus joint exam.  Investigation: No additional findings.  Imaging: Xr Thoracic Spine 2 View  Result Date: 07/21/2018 Multilevel spondylosis with osteophytes was noted.  No syndesmophytes were noted.  No vertebral fracture was noted. Impression: These findings are consistent with multilevel spondylosis.   Recent Labs: Lab Results  Component Value Date   WBC  10.3 07/03/2017   HGB 13.4 07/03/2017   PLT 194.0 07/03/2017   NA 138 04/14/2018   K 3.9 04/14/2018   CL 104 04/14/2018   CO2 28 04/14/2018   GLUCOSE 91 04/14/2018   BUN 12 04/14/2018   CREATININE 0.85 04/14/2018   BILITOT 0.5 07/09/2018   ALKPHOS 57 07/09/2018   AST 18 07/09/2018   ALT 9 07/09/2018   PROT 7.3 07/09/2018   ALBUMIN 4.5 07/09/2018   CALCIUM 9.8 04/14/2018   GFRAA  12/25/2009    >60        The eGFR has been calculated using the MDRD equation. This calculation has not been validated in all clinical situations. eGFR's persistently <60 mL/min signify possible Chronic Kidney Disease.    Speciality Comments: No specialty comments available.  Procedures:  No procedures performed Allergies: Cefuroxime axetil; Doxycycline; Lovastatin; and Sulfonamide derivatives   Assessment / Plan:     Visit Diagnoses: Fibromyalgia-she continues to have some generalized pain and discomfort.  She has positive tender points.  Raynaud's disease without gangrene-currently not active.  Chronic midline thoracic back pain -she has been experiencing increased thoracic pain.  Plan: XR Thoracic Spine 2 View.  X-ray was consistent with multilevel spondylosis and osteophytes.  A handout on back exercises was given.  Primary osteoarthritis of both hands-she has discomfort in her bilateral second and fifth digit DIP joints.  Joint protection muscle strengthening was discussed.  I have given her a handout on hand exercises.  Primary osteoarthritis of both hips-she is currently not having much discomfort in her hips.  Primary osteoarthritis of both knees-no warmth swelling or effusion was noted.  Primary osteoarthritis of both feet-doing better with proper fitting shoes.  Other insomnia-chronic.  Other fatigue-related to insomnia.  History of vertebral compression fracture - L4 compression fracture - T score -0.9 2016  History of vitamin D deficiency  Other medical problems are listed  as follows:  Bilateral carpal tunnel syndrome  History of hyperlipidemia  History of diverticulosis  History of glaucoma  History of asthma  History of kidney stones  History of IBS  History of hypertension  History of gastroesophageal reflux (GERD)   Orders: Orders Placed This Encounter  Procedures  . XR Thoracic Spine 2 View   No orders of the defined types were placed in this encounter.   Face-to-face time spent with patient was 30 minutes. Greater than 50% of time was spent in counseling and coordination of care.  Follow-Up Instructions: Return in about 1 year (around 07/22/2019) for Fibromyalgia, Osteoarthritis, Raynaud's syndrome.   Bo Merino, MD  Note -  This record has been created using [employer].  Chart creation errors have been sought, but may not always  have been located. Such creation errors do not reflect on  the standard of medical care.

## 2018-07-09 ENCOUNTER — Ambulatory Visit: Payer: Medicare Other

## 2018-07-09 ENCOUNTER — Ambulatory Visit (INDEPENDENT_AMBULATORY_CARE_PROVIDER_SITE_OTHER): Payer: Medicare Other

## 2018-07-09 VITALS — BP 140/80 | HR 65 | Temp 98.0°F | Ht 61.75 in | Wt 135.5 lb

## 2018-07-09 DIAGNOSIS — I10 Essential (primary) hypertension: Secondary | ICD-10-CM

## 2018-07-09 DIAGNOSIS — R739 Hyperglycemia, unspecified: Secondary | ICD-10-CM | POA: Diagnosis not present

## 2018-07-09 DIAGNOSIS — E559 Vitamin D deficiency, unspecified: Secondary | ICD-10-CM

## 2018-07-09 DIAGNOSIS — Z0000 Encounter for general adult medical examination without abnormal findings: Secondary | ICD-10-CM | POA: Diagnosis not present

## 2018-07-09 LAB — HEPATIC FUNCTION PANEL
ALT: 9 U/L (ref 0–35)
AST: 18 U/L (ref 0–37)
Albumin: 4.5 g/dL (ref 3.5–5.2)
Alkaline Phosphatase: 57 U/L (ref 39–117)
Bilirubin, Direct: 0.1 mg/dL (ref 0.0–0.3)
Total Bilirubin: 0.5 mg/dL (ref 0.2–1.2)
Total Protein: 7.3 g/dL (ref 6.0–8.3)

## 2018-07-09 LAB — LIPID PANEL
Cholesterol: 230 mg/dL — ABNORMAL HIGH (ref 0–200)
HDL: 108.8 mg/dL (ref >39.00)
LDL Cholesterol: 107 mg/dL — ABNORMAL HIGH (ref 0–99)
NonHDL: 121.68
Total CHOL/HDL Ratio: 2
Triglycerides: 74 mg/dL (ref 0.0–149.0)
VLDL: 14.8 mg/dL (ref 0.0–40.0)

## 2018-07-09 LAB — VITAMIN D 25 HYDROXY (VIT D DEFICIENCY, FRACTURES): VITD: 48.99 ng/mL (ref 30.00–100.00)

## 2018-07-09 LAB — TSH: TSH: 3.08 u[IU]/mL (ref 0.35–4.50)

## 2018-07-09 LAB — HEMOGLOBIN A1C: Hgb A1c MFr Bld: 5.9 % (ref 4.6–6.5)

## 2018-07-09 NOTE — Patient Instructions (Signed)
Ms. Sekula , Thank you for taking time to come for your Medicare Wellness Visit. I appreciate your ongoing commitment to your health goals. Please review the following plan we discussed and let me know if I can assist you in the future.   These are the goals we discussed: Goals    . Increase physical activity     Starting 07/09/2018, I will continue to drink at least 6-8 glasses of water daily.        This is a list of the screening recommended for you and due dates:  Health Maintenance  Topic Date Due  . Mammogram  11/12/2019  . Colon Cancer Screening  01/12/2020  . Tetanus Vaccine  07/04/2023  . Flu Shot  Completed  . DEXA scan (bone density measurement)  Completed  .  Hepatitis C: One time screening is recommended by Center for Disease Control  (CDC) for  adults born from [birth year] through 1966.   Completed  . Pneumonia vaccines  Completed   Preventive Care for Adults  A healthy lifestyle and preventive care can promote health and wellness. Preventive health guidelines for adults include the following key practices.  . A routine yearly physical is a good way to check with your health care provider about your health and preventive screening. It is a chance to share any concerns and updates on your health and to receive a thorough exam.  . Visit your dentist for a routine exam and preventive care every 6 months. Brush your teeth twice a day and floss once a day. Good oral hygiene prevents tooth decay and gum disease.  . The frequency of eye exams is based on your age, health, family medical history, use  of contact lenses, and other factors. Follow your health care provider's recommendations for frequency of eye exams.  . Eat a healthy diet. Foods like vegetables, fruits, whole grains, low-fat dairy products, and lean protein foods contain the nutrients you need without too many calories. Decrease your intake of foods high in solid fats, added sugars, and salt. Eat the right amount of  calories for you. Get information about a proper diet from your health care provider, if necessary.  . Regular physical exercise is one of the most important things you can do for your health. Most adults should get at least 150 minutes of moderate-intensity exercise (any activity that increases your heart rate and causes you to sweat) each week. In addition, most adults need muscle-strengthening exercises on 2 or more days a week.  Silver Sneakers may be a benefit available to you. To determine eligibility, you may visit the website: www.silversneakers.com or contact program at 587-724-5713 Mon-Fri between 8AM-8PM.   . Maintain a healthy weight. The body mass index (BMI) is a screening tool to identify possible weight problems. It provides an estimate of body fat based on height and weight. Your health care provider can find your BMI and can help you achieve or maintain a healthy weight.   For adults 20 years and older: ? A BMI below 18.5 is considered underweight. ? A BMI of 18.5 to 24.9 is normal. ? A BMI of 25 to 29.9 is considered overweight. ? A BMI of 30 and above is considered obese.   . Maintain normal blood lipids and cholesterol levels by exercising and minimizing your intake of saturated fat. Eat a balanced diet with plenty of fruit and vegetables. Blood tests for lipids and cholesterol should begin at age 64 and be repeated every 5 years.  If your lipid or cholesterol levels are high, you are over 50, or you are at high risk for heart disease, you may need your cholesterol levels checked more frequently. Ongoing high lipid and cholesterol levels should be treated with medicines if diet and exercise are not working.  . If you smoke, find out from your health care provider how to quit. If you do not use tobacco, please do not start.  . If you choose to drink alcohol, please do not consume more than 2 drinks per day. One drink is considered to be 12 ounces (355 mL) of beer, 5 ounces  (148 mL) of wine, or 1.5 ounces (44 mL) of liquor.  . If you are 35-2 years old, ask your health care provider if you should take aspirin to prevent strokes.  . Use sunscreen. Apply sunscreen liberally and repeatedly throughout the day. You should seek shade when your shadow is shorter than you. Protect yourself by wearing long sleeves, pants, a wide-brimmed hat, and sunglasses year round, whenever you are outdoors.  . Once a month, do a whole body skin exam, using a mirror to look at the skin on your back. Tell your health care provider of new moles, moles that have irregular borders, moles that are larger than a pencil eraser, or moles that have changed in shape or color.

## 2018-07-09 NOTE — Progress Notes (Signed)
Subjective:   Samantha [name] is a 72 y.o. female who presents for Medicare Annual (Subsequent) preventive examination.  Review of Systems:  N/A Cardiac Risk Factors include: advanced age (>44men, >45 women);dyslipidemia     Objective:     Vitals: BP 140/80 (BP Location: Right Arm, Patient Position: Sitting, Cuff Size: Normal)   Pulse 65   Temp 98 F (36.7 C) (Oral)   Ht 5' 1.75" (1.568 m) Comment: no shoes  Wt 135 lb 8 oz (61.5 kg)   SpO2 99%   BMI 24.98 kg/m   Body mass index is 24.98 kg/m.  Advanced Directives 07/09/2018 07/15/2017 07/03/2017 06/27/2016 01/02/2016  Does Patient Have a Medical Advance Directive? No No No Yes No  Type of Advance Directive - - - [place] -  Does patient want to make changes to medical advance directive? - Yes (MAU/Ambulatory/Procedural Areas - Information given) Yes (MAU/Ambulatory/Procedural Areas - Information given) No - Patient declined -  Copy of [place] in Chart? - - - No - copy requested -  Would patient like information on creating a medical advance directive? Yes (MAU/Ambulatory/Procedural Areas - Information given) - - - Yes - Educational materials given    Tobacco Social History   Tobacco Use  Smoking Status Never Smoker  Smokeless Tobacco Never Used     Counseling given: No   Clinical Intake:  Pre-visit preparation completed: Yes  Pain : No/denies pain Pain Score: 0-No pain     Nutritional Status: BMI 25 -29 Overweight Nutritional Risks: None Diabetes: No  How often do you need to have someone help you when you read instructions, pamphlets, or other written materials from your doctor or pharmacy?: 1 - Never What is the last grade level you completed in school?: 12th grade + 1 yr college  Interpreter Needed?: No  Comments: pt is a widow and lives alone Information entered by :: LPinson, LPN  Past Medical History:  Diagnosis Date  . ALLERGIC RHINITIS 07/26/2008  .  ASTHMA 04/14/2007  . Asthma   . ASYMPTOMATIC POSTMENOPAUSAL STATUS 05/10/2008  . CHEST PAIN 08/26/2008  . COLONIC POLYPS, HX OF 04/14/2007   colonic leiomyoma  . Episcleritis   . FIBROIDS, UTERUS 05/10/2008  . Fibromyalgia   . GERD 04/14/2007  . HYPERCHOLESTEROLEMIA 01/07/2008  . HYPERGLYCEMIA 11/28/2009  . HYPERTENSION 01/28/2008  . INSOMNIA 05/10/2008  . Irritable bowel syndrome 04/06/2010  . OSTEOARTHRITIS 11/28/2009  . 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 SINUSITIS 02/10/2009   Past Surgical History:  Procedure Laterality Date  . COLONOSCOPY W/ POLYPECTOMY    . ELECTROCARDIOGRAM  12/04/2006  . IR RADIOLOGY PERIPHERAL GUIDED IV START  04/21/2018  . IR US GUIDE VASC ACCESS LEFT  04/21/2018  . LAPAROSCOPY    . NASAL SEPTUM SURGERY    . Stress Cardiolite  02/13/2002   Family History  Problem Relation Age of Onset  . Heart disease Father        CHF  . Allergic rhinitis Father   . Asthma Father   . Allergic rhinitis Sister   . Asthma Sister   . Allergic rhinitis Brother   . Asthma Brother   . Allergic rhinitis Maternal Aunt   . Asthma Maternal Aunt   . Allergic rhinitis Paternal Aunt   . Asthma Paternal Aunt   . Breast cancer Cousin   . Cancer Neg Hx        No FH of Colon Cancer  . Angioedema Neg Hx   . Atopy  Neg Hx   . Immunodeficiency Neg Hx   . Urticaria Neg Hx   . Eczema Neg Hx    Social History   Socioeconomic History  . Marital status: Widowed    Spouse name: Not on file  . Number of children: 3  . Years of education: Not on file  . Highest education level: Not on file  Occupational History    Employer: RETIRED    Comment: Retired Stage manager)  Social Needs  . Financial resource strain: Not on file  . Food insecurity:    Worry: Not on file    Inability: Not on file  . Transportation needs:    Medical: Not on file    Non-medical: Not on file  Tobacco Use  . Smoking status: Never Smoker  . Smokeless tobacco: Never Used  Substance and Sexual Activity  . Alcohol  use: No  . Drug use: No  . Sexual activity: Never  Lifestyle  . Physical activity:    Days per week: Not on file    Minutes per session: Not on file  . Stress: Not on file  Relationships  . Social connections:    Talks on phone: Not on file    Gets together: Not on file    Attends religious service: Not on file    Active member of club or organization: Not on file    Attends meetings of clubs or organizations: Not on file    Relationship status: Not on file  Other Topics Concern  . Not on file  Social History Narrative   Lives alone (widowed 1998).   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      Does not have living will.  Does desire CPR.  Does not want life support for prolonged periods of time if futile.   2 sons and 1 daughter.      Outpatient Encounter Medications as of 07/09/2018  Medication Sig  . albuterol (PROVENTIL HFA) 108 (90 Base) MCG/ACT inhaler Inhale 2 puffs into the lungs every 6 (six) hours as needed.  . Ascorbic Acid (VITAMIN C) 500 MG tablet Take 500 mg by mouth daily.    . Beclomethasone Dipropionate (QNASL) 80 MCG/ACT AERS Two sprays into both nostrils daily  . benzonatate (TESSALON) 100 MG capsule Take 1 capsule (100 mg total) by mouth 3 (three) times daily as needed for cough.  . bifidobacterium infantis (ALIGN) capsule Take 1 capsule by mouth daily.    . budesonide-formoterol (SYMBICORT) 160-4.5 MCG/ACT inhaler Inhale 2 puffs into the lungs 2 (two) times daily.  . cetirizine (ZYRTEC) 10 MG tablet Take 10 mg by mouth daily.  . Coenzyme Q-10 60 MG CAPS Take 1 capsule by mouth 2 (two) times daily.   . Cyanocobalamin (VITAMIN B-12) 1000 MCG SUBL Place 1 tablet under the tongue daily. Contains Folic Acid, B6, and Biotin  . losartan (COZAAR) 100 MG tablet Take 1 tablet (100 mg total) by mouth daily.  . Magnesium Malate POWD (1250mg  tabs) 1 tablet by mouth two times a day  . Manganese 50 MG TABS Take 50 mg by mouth.  . Misc Natural Products (TART CHERRY  ADVANCED PO) Take by mouth.  . mometasone (ASMANEX) 220 MCG/INH inhaler Inhale 1 puff into the lungs daily.  . montelukast (SINGULAIR) 10 MG tablet Take 1 tablet (10 mg total) by mouth daily.  . Olopatadine HCl (PATADAY) 0.2 % SOLN Place 1 drop into both eyes daily.  . Omega-3 Fatty Acids (OMEGA 3 PO)  Take one tablet daily  . pantoprazole (PROTONIX) 40 MG tablet Take 1 tablet (40 mg total) by mouth daily.  Marland Kitchen thiamine 100 MG tablet Take 100 mg by mouth daily.    . TURMERIC PO Take by mouth daily.  . [DISCONTINUED] amoxicillin-clavulanate (AUGMENTIN) 875-125 MG tablet Take 1 tablet by mouth 2 (two) times daily.  . [DISCONTINUED] predniSONE (DELTASONE) 20 MG tablet Take 1 tablet (20 mg total) by mouth daily with breakfast.   No facility-administered encounter medications on file as of 07/09/2018.     Activities of Daily Living In your present state of health, do you have any difficulty performing the following activities: 07/09/2018  Hearing? N  Vision? N  Difficulty concentrating or making decisions? N  Walking or climbing stairs? N  Dressing or bathing? N  Doing errands, shopping? N  Preparing Food and eating ? N  Using the Toilet? N  In the past six months, have you accidently leaked urine? N  Do you have problems with loss of bowel control? N  Managing your Medications? N  Managing your Finances? N  Housekeeping or managing your Housekeeping? N  Some recent data might be hidden    Patient Care Team: Tonia Ghent, MD as PCP - General (Family Medicine) Deneise Lever, MD (Pulmonary Disease) Ladene Artist, MD (Gastroenterology) Bo Merino, MD (Rheumatology) Arvella Nigh, MD as Consulting Physician (Obstetrics and Gynecology) Leandrew Koyanagi, MD as Referring Physician (Ophthalmology) Melissa Montane, MD as Consulting Physician (Otolaryngology) Melrose Nakayama, MD as Consulting Physician (Orthopedic Surgery) Druscilla Brownie, MD as Consulting Physician  (Dermatology) Everitt Amber, MD as Consulting Physician (Obstetrics and Gynecology) Otis Brace, MD as Consulting Physician (Gastroenterology) Arvella Nigh, MD as Consulting Physician (Obstetrics and Gynecology) Neldon Mc Donnamarie Poag, MD as Consulting Physician (Allergy and Immunology)    Assessment:   This is a routine wellness examination for Ly[place].   Hearing Screening   125Hz  250Hz  500Hz  1000Hz  2000Hz  3000Hz  4000Hz  6000Hz  8000Hz   Right ear:   40 40 40  40    Left ear:   40 40 40  40    Vision Screening Comments: Vision exam in June 2019 with Dr. Wallace Going   Exercise Activities and Dietary recommendations Exercise limited by: None identified  Goals    . Increase physical activity     Starting 07/09/2018, I will continue to drink at least 6-8 glasses of water daily.        Fall Risk Fall Risk  07/09/2018 07/03/2017 06/27/2016 06/08/2015 06/02/2014  Falls in the past year? No No No No No    Depression Screen PHQ 2/9 Scores 07/09/2018 07/03/2017 06/27/2016 06/08/2015  PHQ - 2 Score 0 1 0 0  PHQ- 9 Score 0 7 - -     Cognitive Function MMSE - Mini Mental State Exam 07/09/2018 07/03/2017 06/27/2016  Orientation to time 5 5 5   Orientation to Place 5 5 5   Registration 3 3 3   Attention/ Calculation 0 0 0  Recall 3 3 3   Language- name 2 objects 0 0 0  Language- repeat 1 1 1   Language- follow 3 step command 3 3 3   Language- read & follow direction 0 0 0  Write a sentence 0 0 0  Copy design 0 0 0  Total score 20 20 20      PLEASE NOTE: A Mini-Cog screen was completed. Maximum score is 20. A value of 0 denotes this part of [place] MMSE was not completed or the patient failed this part of the Mini-Cog  screening.   Mini-Cog Screening Orientation to Time - Max 5 pts Orientation to Place - Max 5 pts Registration - Max 3 pts Recall - Max 3 pts Language Repeat - Max 1 pts Language Follow 3 Step Command - Max 3 pts     Immunization History  Administered Date(s) Administered    . Influenza Split 06/04/2011, 05/27/2012  . Influenza Whole 08/18/2008, 05/26/2010  . Influenza, High Dose Seasonal PF 05/22/2017, 06/11/2018  . Influenza,inj,Quad PF,6+ Mos 06/08/2013, 05/19/2014, 06/08/2015, 05/07/2016  . Pneumococcal Conjugate-13 03/17/2014  . Pneumococcal Polysaccharide-23 11/23/2009, 06/08/2015  . Td 11/09/2002  . Tdap 07/03/2013  . Zoster 06/17/2013   Screening Tests Health Maintenance  Topic Date Due  . MAMMOGRAM  11/12/2019  . COLONOSCOPY  01/12/2020  . TETANUS/TDAP  07/04/2023  . INFLUENZA VACCINE  Completed  . DEXA SCAN  Completed  . Hepatitis C Screening  Completed  . PNA vac Low Risk Adult  Completed      Plan:     I have personally reviewed, addressed, and noted the following in the patient's chart:  A. Medical and social history B. Use of alcohol, tobacco or illicit drugs  C. Current medications and supplements D. Functional ability and status E.  Nutritional status F.  Physical activity G. Advance directives H. List of other physicians I.  Hospitalizations, surgeries, and ER visits in previous 12 months J.  [place] to include hearing, vision, cognitive, depression L. Referrals and appointments - none  In addition, I have reviewed and discussed with patient certain preventive protocols, quality metrics, and best practice recommendations. A written personalized care plan for preventive services as well as general preventive health recommendations were provided to patient.  See attached scanned questionnaire for additional information.   Signed,   Lindell Noe, MHA, BS, LPN Health Coach

## 2018-07-09 NOTE — Progress Notes (Signed)
.  PCP notes:   Health maintenance:  No gaps identified.   Abnormal screenings:   No gaps identified.   Patient concerns:   None  Nurse concerns:  None  Next PCP appt:   07/14/2018 @ 1215  I reviewed health advisor's note, was available for consultation, and agree with documentation and plan. Loura Pardon MD

## 2018-07-14 ENCOUNTER — Ambulatory Visit (INDEPENDENT_AMBULATORY_CARE_PROVIDER_SITE_OTHER): Payer: Medicare Other | Admitting: Family Medicine

## 2018-07-14 ENCOUNTER — Encounter: Payer: Self-pay | Admitting: Family Medicine

## 2018-07-14 VITALS — BP 126/72 | HR 65 | Temp 98.0°F | Ht 61.75 in | Wt 135.5 lb

## 2018-07-14 DIAGNOSIS — Z23 Encounter for immunization: Secondary | ICD-10-CM | POA: Diagnosis not present

## 2018-07-14 DIAGNOSIS — Z7189 Other specified counseling: Secondary | ICD-10-CM

## 2018-07-14 DIAGNOSIS — K219 Gastro-esophageal reflux disease without esophagitis: Secondary | ICD-10-CM

## 2018-07-14 DIAGNOSIS — I10 Essential (primary) hypertension: Secondary | ICD-10-CM | POA: Diagnosis not present

## 2018-07-14 DIAGNOSIS — Z780 Asymptomatic menopausal state: Secondary | ICD-10-CM | POA: Diagnosis not present

## 2018-07-14 DIAGNOSIS — J45909 Unspecified asthma, uncomplicated: Secondary | ICD-10-CM

## 2018-07-14 DIAGNOSIS — Z0000 Encounter for general adult medical examination without abnormal findings: Secondary | ICD-10-CM

## 2018-07-14 MED ORDER — LOSARTAN POTASSIUM 100 MG PO TABS: 100.0000 mg | ORAL_TABLET | Freq: Every day | ORAL | 3 refills | Status: DC

## 2018-07-14 NOTE — Patient Instructions (Addendum)
Call about an eye exam when possible.  Update me as needed.  Check with your insurance to see if they will cover the shringrix shot.  They may have it at the pharmacy.  We will call about your referral.  Rosaria Ferries or Azalee Course will call you if you don't see one of them on the way out.  Take care.  Glad to see you.

## 2018-07-14 NOTE — Progress Notes (Signed)
Asthma.  Compliant.  No ADE on med.  Not SOB.  No wheeze.  Rare use of SABA.    Hypertension:    Using medication without problems or lightheadedness: yes Chest pain with exertion:no Edema:no Short of breath:no Labs d/w pt.  High HDL noted.    Prev CV eval/CT scoring.   1. Coronary calcium score of 0.  2. Normal coronary origin with left dominance. 3. No evidence of CAD.    She had some occ HA and blurry vision, those had improved as her blood pressure normalized.  BP is reasonable.  No sx currently.  I asked her to f/u with the eye clinic.    GERD.  Still on PPI.  No ADE on med. Off H2 blocker in the meantime. Heartburn is controlled.    Colon 2018 Pap not due.  Mammogram 2019 DXA pending 2019 Vaccines d/w pt.  shingrix out of stock.  Needs f/u PNA 23.   Living will d/w pt.  Daughter Colletta [place] designated if patient were incapacitated.    Meds, vitals, and allergies reviewed.   PMH and SH reviewed  ROS: Per HPI unless specifically indicated in ROS section   GEN: nad, alert and oriented HEENT: mucous membranes moist NECK: supple w/o LA CV: rrr. PULM: ctab, no inc wob ABD: soft, +bs EXT: no edema SKIN: no acute rash

## 2018-07-16 ENCOUNTER — Telehealth: Payer: Self-pay

## 2018-07-16 DIAGNOSIS — Z0000 Encounter for general adult medical examination without abnormal findings: Secondary | ICD-10-CM | POA: Insufficient documentation

## 2018-07-16 NOTE — Telephone Encounter (Signed)
Called and left a message for patient to call office in regards to this matter. Will need to know what pharmacy she would like her script sent to.

## 2018-07-16 NOTE — Assessment & Plan Note (Signed)
Colon 2018 Pap not due.  Mammogram 2019 DXA pending 2019 Vaccines d/w pt.  shingrix out of stock.   Living will d/w pt.  Daughter Colletta [place] designated if patient were incapacitated.

## 2018-07-16 NOTE — Assessment & Plan Note (Signed)
Still on PPI.  No ADE on med. Off H2 blocker in the meantime. Heartburn is controlled.  Continue as is.

## 2018-07-16 NOTE — Assessment & Plan Note (Signed)
Compliant.  No ADE on med.  Not SOB.  No wheeze.  Rare use of SABA.  Continue as is.  She agrees.

## 2018-07-16 NOTE — Assessment & Plan Note (Signed)
Living will d/w pt.  Daughter Colletta [place] designated if patient were incapacitated.

## 2018-07-16 NOTE — Assessment & Plan Note (Addendum)
No change in medication. Labs d/w pt.  High HDL noted.    Prev CV eval/CT scoring.   1. Coronary calcium score of 0.  2. Normal coronary origin with left dominance. 3. No evidence of CAD.    She had some occ HA and blurry vision, those had improved as her blood pressure normalized.  BP is reasonable.  No sx currently.  I asked her to f/u with the eye clinic but she does not have any emergent symptoms at this point.  >25 minutes spent in face to face time with patient, >50% spent in counselling or coordination of care.

## 2018-07-16 NOTE — Telephone Encounter (Signed)
Patient r/s her appt for 12/31 with [facility] which is first available. She is wondering if she can have another refill or a sample of Qnasal 80.   Please Advise.

## 2018-07-17 MED ORDER — BECLOMETHASONE DIPROPIONATE 80 MCG/ACT NA AERS: INHALATION_SPRAY | NASAL | 0 refills | Status: DC

## 2018-07-17 NOTE — Telephone Encounter (Signed)
Called and spoke with patient and informed her I could send in script to last until her OV but she would need to keep her appt. Patient verbalized understanding.

## 2018-07-21 ENCOUNTER — Encounter: Payer: Self-pay | Admitting: Rheumatology

## 2018-07-21 ENCOUNTER — Ambulatory Visit (INDEPENDENT_AMBULATORY_CARE_PROVIDER_SITE_OTHER): Payer: Medicare Other

## 2018-07-21 ENCOUNTER — Ambulatory Visit (INDEPENDENT_AMBULATORY_CARE_PROVIDER_SITE_OTHER): Payer: Medicare Other | Admitting: Rheumatology

## 2018-07-21 VITALS — BP 135/75 | HR 66 | Resp 14 | Ht 61.7 in | Wt 135.6 lb

## 2018-07-21 DIAGNOSIS — M546 Pain in thoracic spine: Secondary | ICD-10-CM

## 2018-07-21 DIAGNOSIS — M19071 Primary osteoarthritis, right ankle and foot: Secondary | ICD-10-CM | POA: Diagnosis not present

## 2018-07-21 DIAGNOSIS — Z87442 Personal history of urinary calculi: Secondary | ICD-10-CM

## 2018-07-21 DIAGNOSIS — Z8781 Personal history of (healed) traumatic fracture: Secondary | ICD-10-CM | POA: Diagnosis not present

## 2018-07-21 DIAGNOSIS — M160 Bilateral primary osteoarthritis of hip: Secondary | ICD-10-CM | POA: Diagnosis not present

## 2018-07-21 DIAGNOSIS — Z8679 Personal history of other diseases of the circulatory system: Secondary | ICD-10-CM

## 2018-07-21 DIAGNOSIS — G8929 Other chronic pain: Secondary | ICD-10-CM | POA: Diagnosis not present

## 2018-07-21 DIAGNOSIS — R5383 Other fatigue: Secondary | ICD-10-CM | POA: Diagnosis not present

## 2018-07-21 DIAGNOSIS — Z8719 Personal history of other diseases of the digestive system: Secondary | ICD-10-CM

## 2018-07-21 DIAGNOSIS — M170 Bilateral primary osteoarthritis of knee: Secondary | ICD-10-CM | POA: Diagnosis not present

## 2018-07-21 DIAGNOSIS — I7300 Raynaud's syndrome without gangrene: Secondary | ICD-10-CM | POA: Diagnosis not present

## 2018-07-21 DIAGNOSIS — Z8669 Personal history of other diseases of the nervous system and sense organs: Secondary | ICD-10-CM

## 2018-07-21 DIAGNOSIS — Z8639 Personal history of other endocrine, nutritional and metabolic disease: Secondary | ICD-10-CM | POA: Diagnosis not present

## 2018-07-21 DIAGNOSIS — G5603 Carpal tunnel syndrome, bilateral upper limbs: Secondary | ICD-10-CM

## 2018-07-21 DIAGNOSIS — M19072 Primary osteoarthritis, left ankle and foot: Secondary | ICD-10-CM

## 2018-07-21 DIAGNOSIS — M797 Fibromyalgia: Secondary | ICD-10-CM

## 2018-07-21 DIAGNOSIS — M19042 Primary osteoarthritis, left hand: Secondary | ICD-10-CM

## 2018-07-21 DIAGNOSIS — M19041 Primary osteoarthritis, right hand: Secondary | ICD-10-CM

## 2018-07-21 DIAGNOSIS — G4709 Other insomnia: Secondary | ICD-10-CM | POA: Diagnosis not present

## 2018-07-21 DIAGNOSIS — Z8709 Personal history of other diseases of the respiratory system: Secondary | ICD-10-CM

## 2018-07-21 NOTE — Patient Instructions (Signed)
Hand Exercises Hand exercises can be helpful to almost anyone. These exercises can strengthen the hands, improve flexibility and movement, and increase blood flow to the hands. These results can make work and daily tasks easier. Hand exercises can be especially helpful for people who have joint pain from arthritis or have nerve damage from overuse (carpal tunnel syndrome). These exercises can also help people who have injured a hand. Most of these hand exercises are fairly gentle stretching routines. You can do them often throughout the day. Still, it is a good idea to ask your health care provider which exercises would be best for you. Warming your hands before exercise may help to reduce stiffness. You can do this with gentle massage or by placing your hands in warm water for 15 minutes. Also, make sure you pay attention to your level of hand pain as you begin an exercise routine. Exercises Knuckle Bend Repeat this exercise 5-10 times with each hand. 1. Stand or sit with your arm, hand, and all five fingers pointed straight up. Make sure your wrist is straight. 2. Gently and slowly bend your fingers down and inward until the tips of your fingers are touching the tops of your palm. 3. Hold this position for a few seconds. 4. Extend your fingers out to their original position, all pointing straight up again.  Finger Fan Repeat this exercise 5-10 times with each hand. 1. Hold your arm and hand out in front of you. Keep your wrist straight. 2. Squeeze your hand into a fist. 3. Hold this position for a few seconds. 4. Fan out, or spread apart, your hand and fingers as much as possible, stretching every joint fully.  Tabletop Repeat this exercise 5-10 times with each hand. 1. Stand or sit with your arm, hand, and all five fingers pointed straight up. Make sure your wrist is straight. 2. Gently and slowly bend your fingers at the knuckles where they meet the hand until your hand is making an  upside-down L shape. Your fingers should form a tabletop. 3. Hold this position for a few seconds. 4. Extend your fingers out to their original position, all pointing straight up again.  Making Os Repeat this exercise 5-10 times with each hand. 1. Stand or sit with your arm, hand, and all five fingers pointed straight up. Make sure your wrist is straight. 2. Make an O shape by touching your pointer finger to your thumb. Hold for a few seconds. Then open your hand wide. 3. Repeat this motion with each finger on your hand.  Table Spread Repeat this exercise 5-10 times with each hand. 1. Place your hand on a table with your palm facing down. Make sure your wrist is straight. 2. Spread your fingers out as much as possible. Hold this position for a few seconds. 3. Slide your fingers back together again. Hold for a few seconds.  Ball Grip  Repeat this exercise 10-15 times with each hand. 1. Hold a tennis ball or another soft ball in your hand. 2. While slowly increasing pressure, squeeze the ball as hard as possible. 3. Squeeze as hard as you can for 3-5 seconds. 4. Relax and repeat.  Wrist Curls Repeat this exercise 10-15 times with each hand. 1. Sit in a chair that has armrests. 2. Hold a light weight in your hand, such as a dumbbell that weighs 1-3 pounds (0.5-1.4 kg). Ask your health care provider what weight would be best for you. 3. Rest your hand just over   the end of the chair arm with your palm facing up. 4. Gently pivot your wrist up and down while holding the weight. Do not twist your wrist from side to side.  Contact a health care provider if:  Your hand pain or discomfort gets much worse when you do an exercise.  Your hand pain or discomfort does not improve within 2 hours after you exercise. If you have any of these problems, stop doing these exercises right away. Do not do them again unless your health care provider says that you can. Get help right away if:  You  develop sudden, severe hand pain. If this happens, stop doing these exercises right away. Do not do them again unless your health care provider says that you can. This information is not intended to replace advice given to you by your health care provider. Make sure you discuss any questions you have with your health care provider. Document Released: 08/08/2015 Document Revised: 02/02/2016 Document Reviewed: 03/07/2015 Elsevier Interactive Patient Education  2018 [place]. Back Exercises The following exercises strengthen the muscles that help to support the back. They also help to keep the lower back flexible. Doing these exercises can help to prevent back pain or lessen existing pain. If you have back pain or discomfort, try doing these exercises 2-3 times each day or as told by your health care provider. When the pain goes away, do them once each day, but increase the number of times that you repeat the steps for each exercise (do more repetitions). If you do not have back pain or discomfort, do these exercises once each day or as told by your health care provider. Exercises Single Knee to Chest  Repeat these steps 3-5 times for each leg: 1. Lie on your back on a firm bed or the floor with your legs extended. 2. Bring one knee to your chest. Your other leg should stay extended and in contact with the floor. 3. Hold your knee in place by grabbing your knee or thigh. 4. Pull on your knee until you feel a gentle stretch in your lower back. 5. Hold the stretch for 10-30 seconds. 6. Slowly release and straighten your leg.  Pelvic Tilt  Repeat these steps 5-10 times: 1. Lie on your back on a firm bed or the floor with your legs extended. 2. Bend your knees so they are pointing toward the ceiling and your feet are flat on the floor. 3. Tighten your lower abdominal muscles to press your lower back against the floor. This motion will tilt your pelvis so your tailbone points up toward the  ceiling instead of pointing to your feet or the floor. 4. With gentle tension and even breathing, hold this position for 5-10 seconds.  Cat-Cow  Repeat these steps until your lower back becomes more flexible: 1. Get into a hands-and-knees position on a firm surface. Keep your hands under your shoulders, and keep your knees under your hips. You may place padding under your knees for comfort. 2. Let your head hang down, and point your tailbone toward the floor so your lower back becomes rounded like the back of a cat. 3. Hold this position for 5 seconds. 4. Slowly lift your head and point your tailbone up toward the ceiling so your back forms a sagging arch like the back of a cow. 5. Hold this position for 5 seconds.  Press-Ups  Repeat these steps 5-10 times: 1. Lie on your abdomen (face-down) on the floor. 2. Place  your palms near your head, about shoulder-width apart. 3. While you keep your back as relaxed as possible and keep your hips on the floor, slowly straighten your arms to raise the top half of your body and lift your shoulders. Do not use your back muscles to raise your upper torso. You may adjust the placement of your hands to make yourself more comfortable. 4. Hold this position for 5 seconds while you keep your back relaxed. 5. Slowly return to lying flat on the floor.  Bridges  Repeat these steps 10 times: 1. Lie on your back on a firm surface. 2. Bend your knees so they are pointing toward the ceiling and your feet are flat on the floor. 3. Tighten your buttocks muscles and lift your buttocks off of the floor until your waist is at almost the same height as your knees. You should feel the muscles working in your buttocks and the back of your thighs. If you do not feel these muscles, slide your feet 1-2 inches farther away from your buttocks. 4. Hold this position for 3-5 seconds. 5. Slowly lower your hips to the starting position, and allow your buttocks muscles to relax  completely.  If this exercise is too easy, try doing it with your arms crossed over your chest. Abdominal Crunches  Repeat these steps 5-10 times: 1. Lie on your back on a firm bed or the floor with your legs extended. 2. Bend your knees so they are pointing toward the ceiling and your feet are flat on the floor. 3. Cross your arms over your chest. 4. Tip your chin slightly toward your chest without bending your neck. 5. Tighten your abdominal muscles and slowly raise your trunk (torso) high enough to lift your shoulder blades a tiny bit off of the floor. Avoid raising your torso higher than that, because it can put too much stress on your low back and it does not help to strengthen your abdominal muscles. 6. Slowly return to your starting position.  Back Lifts Repeat these steps 5-10 times: 1. Lie on your abdomen (face-down) with your arms at your sides, and rest your forehead on the floor. 2. Tighten the muscles in your legs and your buttocks. 3. Slowly lift your chest off of the floor while you keep your hips pressed to the floor. Keep the back of your head in line with the curve in your back. Your eyes should be looking at the floor. 4. Hold this position for 3-5 seconds. 5. Slowly return to your starting position.  Contact a health care provider if:  Your back pain or discomfort gets much worse when you do an exercise.  Your back pain or discomfort does not lessen within 2 hours after you exercise. If you have any of these problems, stop doing these exercises right away. Do not do them again unless your health care provider says that you can. Get help right away if:  You develop sudden, severe back pain. If this happens, stop doing the exercises right away. Do not do them again unless your health care provider says that you can. This information is not intended to replace advice given to you by your health care provider. Make sure you discuss any questions you have with your health  care provider. Document Released: 10/04/2004 Document Revised: 01/04/2016 Document Reviewed: 10/21/2014 Elsevier Interactive Patient Education  2017 [employer].

## 2018-07-23 ENCOUNTER — Encounter: Payer: Self-pay | Admitting: Allergy

## 2018-07-23 ENCOUNTER — Ambulatory Visit (INDEPENDENT_AMBULATORY_CARE_PROVIDER_SITE_OTHER): Payer: Medicare Other | Admitting: Allergy

## 2018-07-23 VITALS — BP 140/66 | HR 80 | Resp 18 | Ht 62.0 in | Wt 140.2 lb

## 2018-07-23 DIAGNOSIS — K219 Gastro-esophageal reflux disease without esophagitis: Secondary | ICD-10-CM | POA: Diagnosis not present

## 2018-07-23 DIAGNOSIS — J310 Chronic rhinitis: Secondary | ICD-10-CM

## 2018-07-23 DIAGNOSIS — J4541 Moderate persistent asthma with (acute) exacerbation: Secondary | ICD-10-CM | POA: Diagnosis not present

## 2018-07-23 MED ORDER — FAMOTIDINE 40 MG PO TABS
40.0000 mg | ORAL_TABLET | Freq: Every day | ORAL | 2 refills | Status: DC
Start: 2018-07-23 — End: 2018-11-17

## 2018-07-23 MED ORDER — AZELASTINE HCL 0.15 % NA SOLN: 2.0000 | Freq: Two times a day (BID) | NASAL | 2 refills | Status: DC | PRN

## 2018-07-23 NOTE — Assessment & Plan Note (Addendum)
Past history - 2018 skin testing was negative. Interim history - ran out of Qnasl.  Restart Qnasl 80 - 1-2 sprays each nostril one time per day  May use over the counter antihistamines such as Zyrtec (cetirizine), Claritin (loratadine), Allegra (fexofenadine), or Xyzal (levocetirizine) daily as needed.  May use Azelastine 1-2 spray twice a day as needed.

## 2018-07-23 NOTE — Progress Notes (Signed)
Follow Up Note  RE: [name] MRN: [id number] DOB: 1945/09/19 Date of Office Visit: 07/23/2018  Referring provider: Tonia Ghent, MD Primary care provider: Tonia Ghent, MD  Chief Complaint: Cough and Asthma  History of Present Illness: I had the pleasure of seeing Samantha [name] for a follow up visit at the [facility] on 07/23/2018. She is a 72 y.o. female, who is being followed for asthma, allergic rhinitis, reflux. Today she is here for new complaint of chest pain and respiratory issues. Her previous allergy office visit was on 01/14/2018 with Dr. Neldon Mc.   Asthma: Patient has been having issues with her breathing on 07/11/2018 with chest tightness and coughing. Denies any fevers or wheezing. Patient had sick contacts with grandchildren over the weekend.  Normally she takes Symbicort 160 2 puffs twice a day, Singulair daily,  Been taking albuterol and Asmanex for the last few days with good benefit.  Ran out of nasal spray for a few weeks.  Patient had a course of oral prednisone and Augmentin in the September for sinusitis.  Reflux: Doing was doing well but lately has increased burping. Still taking pantoprazole 40mg  daily but stopped ranitidine due to recall.   Assessment and Plan: [name] is a 72 y.o. female with: Moderate persistent asthma with acute exacerbation Chest tightness and coughing for the past few days. Positive sick contacts. Patient had IM depo in May and oral prednisone in September for URI induced asthma exacerbation.  Today's spirometry post Xopenex/Atrovent nebulizer treatment was normal.   Continue montelukast 10 mg - one tablet one time per day  Continue Symbicort 160 two inhalation two times per day with spacer  May use albuterol rescue inhaler 2 puffs or nebulizer every 4 to 6 hours as needed for shortness of breath, chest tightness, coughing, and wheezing. May use albuterol rescue inhaler 2 puffs 5 to 15 minutes prior to  strenuous physical activities.  "Action Plan" for flare up:          A. Add Asmanex 220 Twisthaler 2 inhalations to Symbicort twice a day for 1-2 weeks           B. Start oral prednisone taper.  Keep track of symptoms and prednisone use. If needing more than 2 courses of prednisone may consider adding biologics to control symptoms.   Chronic rhinitis Past history - 2018 skin testing was negative. Interim history - ran out of Qnasl.  Restart Qnasl 80 - 1-2 sprays each nostril one time per day  May use over the counter antihistamines such as Zyrtec (cetirizine), Claritin (loratadine), Allegra (fexofenadine), or Xyzal (levocetirizine) daily as needed.  May use Azelastine 1-2 spray twice a day as needed.   LPRD (laryngopharyngeal reflux disease) Worse since stopped ranitidine.  Continue Pantoprazole 40 mg - one tablet one time per day in AM  Start Pepcid 40mg  one tablet one time per day    Return in about 4 months (around 11/21/2018).  Meds ordered this encounter  Medications  . famotidine (PEPCID) 40 MG tablet    Sig: Take 1 tablet (40 mg total) by mouth daily.    Dispense:  90 tablet    Refill:  2  . Azelastine HCl 0.15 % SOLN    Sig: Place 2 sprays into both nostrils 2 (two) times daily as needed (runny nose).    Dispense:  90 mL    Refill:  2   Diagnostics: Spirometry:  Tracings reviewed. Her effort: Good reproducible efforts.  FVC: 1.92L FEV1: 1.64L, 103% predicted FEV1/FVC ratio: 85% Interpretation: Spirometry consistent with normal pattern.  Please see scanned spirometry results for details.  Medication List:  Current Outpatient Medications  Medication Sig Dispense Refill  . albuterol (PROVENTIL HFA) 108 (90 Base) MCG/ACT inhaler Inhale 2 puffs into the lungs every 6 (six) hours as needed. 3 Inhaler 3  . Ascorbic Acid (VITAMIN C) 500 MG tablet Take 500 mg by mouth daily.      . Beclomethasone Dipropionate (QNASL) 80 MCG/ACT AERS Two sprays into both nostrils  daily 3 Inhaler 0  . benzonatate (TESSALON) 100 MG capsule Take 1 capsule (100 mg total) by mouth 3 (three) times daily as needed for cough. 30 capsule 1  . bifidobacterium infantis (ALIGN) capsule Take 1 capsule by mouth daily.      . budesonide-formoterol (SYMBICORT) 160-4.5 MCG/ACT inhaler Inhale 2 puffs into the lungs 2 (two) times daily. 30.6 Inhaler 0  . cetirizine (ZYRTEC) 10 MG tablet Take 10 mg by mouth daily.    . Coenzyme Q-10 60 MG CAPS Take 1 capsule by mouth 2 (two) times daily.     . Cyanocobalamin (VITAMIN B-12) 1000 MCG SUBL Place 1 tablet under the tongue daily. Contains Folic Acid, B6, and Biotin    . losartan (COZAAR) 100 MG tablet Take 1 tablet (100 mg total) by mouth daily. 90 tablet 3  . Magnesium Malate POWD (1250mg  tabs) 1 tablet by mouth two times a day    . Manganese 50 MG TABS Take 50 mg by mouth.    . Misc Natural Products (TART CHERRY ADVANCED PO) Take by mouth.    . mometasone (ASMANEX) 220 MCG/INH inhaler Inhale 1 puff into the lungs daily. 3 Inhaler 3  . montelukast (SINGULAIR) 10 MG tablet Take 1 tablet (10 mg total) by mouth daily. 90 tablet 3  . Olopatadine HCl (PATADAY) 0.2 % SOLN Place 1 drop into both eyes daily. 3 Bottle 3  . Omega-3 Fatty Acids (OMEGA 3 PO) Take one tablet daily    . pantoprazole (PROTONIX) 40 MG tablet Take 1 tablet (40 mg total) by mouth daily. 30 tablet 3  . thiamine 100 MG tablet Take 100 mg by mouth daily.      . TURMERIC PO Take by mouth daily.    . Azelastine HCl 0.15 % SOLN Place 2 sprays into both nostrils 2 (two) times daily as needed (runny nose). 90 mL 2  . famotidine (PEPCID) 40 MG tablet Take 1 tablet (40 mg total) by mouth daily. 90 tablet 2   No current facility-administered medications for this visit.    Allergies: Allergies  Allergen Reactions  . Cefuroxime Axetil     REACTION: pt not sure of reaction  . Doxycycline Itching  . Lovastatin Other (See Comments)    Aches   . Sulfonamide Derivatives     REACTION:  pt not sure of reaction   I reviewed her past medical history, social history, family history, and environmental history and no significant changes have been reported from previous visit on 01/14/2018.  Review of Systems  Constitutional: Negative for appetite change, chills, fever and unexpected weight change.  HENT: Negative for congestion and rhinorrhea.   Eyes: Negative for itching.  Respiratory: Positive for cough and chest tightness. Negative for shortness of breath and wheezing.   Gastrointestinal: Negative for abdominal pain.  Skin: Negative for rash.  Neurological: Negative for headaches.   Objective: BP 140/66 (BP Location: Left Arm, Patient Position: Sitting, Cuff Size: Normal)  Pulse 80   Resp 18   Ht 5\' 2"  (1.575 m)   Wt 140 lb 3.2 oz (63.6 kg)   SpO2 97%   BMI 25.64 kg/m  Body mass index is 25.64 kg/m. Physical Exam  Constitutional: She is oriented to person, place, and time. She appears well-developed and well-nourished.  HENT:  Head: Normocephalic and atraumatic.  Right Ear: External ear normal.  Left Ear: External ear normal.  Nose: Nose normal.  Mouth/Throat: Oropharynx is clear and moist.  Eyes: Conjunctivae and EOM are normal.  Neck: Neck supple.  Cardiovascular: Normal rate, regular rhythm and normal heart sounds. Exam reveals no gallop and no friction rub.  No murmur heard. Pulmonary/Chest: Effort normal and breath sounds normal. She has no wheezes. She has no rales.  Lymphadenopathy:    She has no cervical adenopathy.  Neurological: She is alert and oriented to person, place, and time.  Skin: Skin is warm. No rash noted.  Psychiatric: She has a normal mood and affect. Her behavior is normal.  Nursing note and vitals reviewed.  Previous notes and tests were reviewed. The plan was reviewed with the patient/family, and all questions/concerned were addressed.  It was my pleasure to see Sumire today and participate in her care. Please feel free to contact  me with any questions or concerns.  Sincerely,  Rexene Alberts, DO Allergy & Immunology  [facility] office: [phone] [place]

## 2018-07-23 NOTE — Patient Instructions (Addendum)
1. Continue to Treat and prevent inflammation:           A. Qnasl 80 - 1-2 sprays each nostril one time per day           B. montelukast 10 mg - one tablet one time per day           C. Symbicort 160 two inhalation two times per day with spacer  2. Continue to Treat and prevent reflux:           A. Continue off caffeine and chocolate            B. Pantoprazole 40 mg - one tablet one time per day in AM           C. Pepcid 40mg  one tablet one time per day    3. If needed:          A. ProAir HFA 2 puffs every 4-6 hours           B. nasal saline wash           C. OTC antihistamine - Claritin/Zyrtec/Allegra            D. Azelastine 1-2 spray twice a day as needed.     4. "Action Plan" for flare up:          A. Add Asmanex 220 Twisthaler 2 inhalations to Symbicort twice a day for 1-2 weeks           B. May use albuterol rescue inhaler 2 puffs or nebulizer every 4 to 6 hours as needed for shortness of breath, chest tightness, coughing, and wheezing. May use albuterol rescue inhaler 2 puffs 5 to 15 minutes prior to strenuous physical activities.  5. Keep track of your symptoms and prednisone use.

## 2018-07-23 NOTE — Assessment & Plan Note (Signed)
Worse since stopped ranitidine.  Continue Pantoprazole 40 mg - one tablet one time per day in AM  Start Pepcid 40mg  one tablet one time per day

## 2018-07-23 NOTE — Assessment & Plan Note (Addendum)
Chest tightness and coughing for the past few days. Positive sick contacts. Patient had IM depo in May and oral prednisone in September for URI induced asthma exacerbation.  Today's spirometry post Xopenex/Atrovent nebulizer treatment was normal.   Continue montelukast 10 mg - one tablet one time per day  Continue Symbicort 160 two inhalation two times per day with spacer  May use albuterol rescue inhaler 2 puffs or nebulizer every 4 to 6 hours as needed for shortness of breath, chest tightness, coughing, and wheezing. May use albuterol rescue inhaler 2 puffs 5 to 15 minutes prior to strenuous physical activities.  "Action Plan" for flare up:          A. Add Asmanex 220 Twisthaler 2 inhalations to Symbicort twice a day for 1-2 weeks           B. Start oral prednisone taper.  Keep track of symptoms and prednisone use. If needing more than 2 courses of prednisone may consider adding biologics to control symptoms.

## 2018-07-25 ENCOUNTER — Telehealth: Payer: Self-pay | Admitting: Family Medicine

## 2018-07-25 MED ORDER — FLUCONAZOLE 150 MG PO TABS: 150.0000 mg | ORAL_TABLET | Freq: Once | ORAL | 0 refills | Status: DC

## 2018-07-25 MED ORDER — FLUCONAZOLE 150 MG PO TABS: 150.0000 mg | ORAL_TABLET | Freq: Once | ORAL | 0 refills | Status: AC

## 2018-07-25 NOTE — Telephone Encounter (Signed)
Patient notified as instructed by telephone and verbalized understanding. 

## 2018-07-25 NOTE — Telephone Encounter (Signed)
I sent a dose of diflucan to [facility] initially but then I resent it to [facility].  Use one dose and that should cover it.  Update [place] as needed.  Hope she feels better soon.  Thanks.

## 2018-07-25 NOTE — Telephone Encounter (Signed)
Best number Samantha [name] st  Pt called wanting to know if you would call her something in for yeast infection.  She forgot to mention this to you last week.  She stated she has had this since she has been taking her antibiotics. And she kept forgetting to call to ask for something

## 2018-07-28 ENCOUNTER — Other Ambulatory Visit: Payer: Medicare Other

## 2018-07-28 ENCOUNTER — Other Ambulatory Visit: Payer: Self-pay

## 2018-08-04 ENCOUNTER — Ambulatory Visit (INDEPENDENT_AMBULATORY_CARE_PROVIDER_SITE_OTHER)
Admission: RE | Admit: 2018-08-04 | Discharge: 2018-08-04 | Disposition: A | Discharge status: Home / Self Care | Payer: Medicare Other | Source: Ambulatory Visit | Attending: Family Medicine | Admitting: Family Medicine

## 2018-08-04 DIAGNOSIS — Z780 Asymptomatic menopausal state: Secondary | ICD-10-CM | POA: Diagnosis not present

## 2018-08-09 ENCOUNTER — Telehealth: Payer: Self-pay | Admitting: Family Medicine

## 2018-08-09 ENCOUNTER — Encounter: Payer: Self-pay | Admitting: Family Medicine

## 2018-08-09 DIAGNOSIS — M8580 Other specified disorders of bone density and structure, unspecified site: Secondary | ICD-10-CM | POA: Insufficient documentation

## 2018-08-09 NOTE — Telephone Encounter (Signed)
Notify patient.  Bone density report shows that she has osteopenia.  She has mild thinning but it is not severe.  It is closer to normal than it is to osteoporosis.  Her vitamin D level is normal.  I would continue as is.  It does not look like she needs extra medication at this point.  We can consider rechecking a bone density test in a few years.  Thanks.

## 2018-08-11 NOTE — Telephone Encounter (Signed)
Patient advised.

## 2018-08-25 ENCOUNTER — Other Ambulatory Visit
Admission: RE | Admit: 2018-08-25 | Discharge: 2018-08-25 | Disposition: A | Discharge status: Home / Self Care | Payer: Medicare Other | Source: Ambulatory Visit | Attending: Ophthalmology | Admitting: Ophthalmology

## 2018-08-25 DIAGNOSIS — R51 Headache: Secondary | ICD-10-CM | POA: Diagnosis not present

## 2018-08-25 DIAGNOSIS — H40003 Preglaucoma, unspecified, bilateral: Secondary | ICD-10-CM | POA: Diagnosis not present

## 2018-08-25 LAB — CBC
HCT: 37.5 % (ref 36.0–46.0)
Hemoglobin: 12.7 g/dL (ref 12.0–15.0)
MCH: 29.3 pg (ref 26.0–34.0)
MCHC: 33.9 g/dL (ref 30.0–36.0)
MCV: 86.4 fL (ref 80.0–100.0)
Platelets: 223 10*3/uL (ref 150–400)
RBC: 4.34 MIL/uL (ref 3.87–5.11)
RDW: 13.2 % (ref 11.5–15.5)
WBC: 6.5 10*3/uL (ref 4.0–10.5)
nRBC: 0 % (ref 0.0–0.2)

## 2018-08-25 LAB — C-REACTIVE PROTEIN: CRP: <0.8 mg/dL (ref <1.0)

## 2018-08-25 LAB — SEDIMENTATION RATE: Sed Rate: 25 mm/hr (ref 0–30)

## 2018-09-08 ENCOUNTER — Telehealth: Payer: Self-pay | Admitting: Allergy and Immunology

## 2018-09-08 MED ORDER — MONTELUKAST SODIUM 10 MG PO TABS: 10.0000 mg | ORAL_TABLET | Freq: Every day | ORAL | 0 refills | Status: DC

## 2018-09-08 NOTE — Telephone Encounter (Signed)
Prescription refill has been sent in to the requested pharmacy.

## 2018-09-08 NOTE — Telephone Encounter (Signed)
Pt called and needs to have singulair 90 day supply sent to [facility]. [phone].

## 2018-09-09 ENCOUNTER — Ambulatory Visit: Payer: Medicare Other | Admitting: Allergy and Immunology

## 2018-09-17 ENCOUNTER — Other Ambulatory Visit: Payer: Self-pay | Admitting: *Deleted

## 2018-09-17 MED ORDER — MONTELUKAST SODIUM 10 MG PO TABS: 10.0000 mg | ORAL_TABLET | Freq: Every day | ORAL | 0 refills | Status: DC

## 2018-09-17 NOTE — Telephone Encounter (Signed)
30 day supply has been sent to the requested pharmacy for Singulair. Called patient and left voicemail informing that a 30 day supply has been sent to the pharmacy.

## 2018-09-17 NOTE — Telephone Encounter (Signed)
Patient called stating she has not received her medication through mail order, patient is requesting a 30 day supply until the mail order comes in. Patient would like for the 30 day supply to go to [facility] on [address] in [place]. Please call patient

## 2018-10-28 ENCOUNTER — Other Ambulatory Visit: Payer: Self-pay | Admitting: *Deleted

## 2018-10-28 ENCOUNTER — Telehealth: Payer: Self-pay

## 2018-10-28 MED ORDER — MONTELUKAST SODIUM 10 MG PO TABS: 10.0000 mg | ORAL_TABLET | Freq: Every day | ORAL | 1 refills | Status: DC

## 2018-10-28 MED ORDER — BECLOMETHASONE DIPROPIONATE 80 MCG/ACT NA AERS: INHALATION_SPRAY | NASAL | 1 refills | Status: DC

## 2018-10-28 MED ORDER — BUDESONIDE-FORMOTEROL FUMARATE 160-4.5 MCG/ACT IN AERO: 2.0000 | INHALATION_SPRAY | Freq: Two times a day (BID) | RESPIRATORY_TRACT | 1 refills | Status: DC

## 2018-10-28 NOTE — Telephone Encounter (Signed)
Prescriptions have been sent in for 90 day supply with 1 refill per medication. Patient inquired about sending in medications in for 90 day supplies with 3 refills per medication. Explained to the patient that with [facility] since she is an asthma patient we do require an office visit every 6 months to better manage her asthma which is why she was given enough medications to last her 6 months. Patient verbalized understanding.

## 2018-10-28 NOTE — Telephone Encounter (Signed)
Patient called requesting a 90 Day Supply with 3 refills on  Qnasal , Symbicort , Singulair.   [facility]

## 2018-11-04 ENCOUNTER — Other Ambulatory Visit: Payer: Self-pay | Admitting: Family Medicine

## 2018-11-04 ENCOUNTER — Telehealth: Payer: Self-pay | Admitting: Family Medicine

## 2018-11-04 DIAGNOSIS — Z1231 Encounter for screening mammogram for malignant neoplasm of breast: Secondary | ICD-10-CM

## 2018-11-04 NOTE — Telephone Encounter (Signed)
Pt need an order for a diagnostic mammogram sent to [facility]. Please advise pt concerning the referral

## 2018-11-04 NOTE — Telephone Encounter (Signed)
Patient says she called to schedule her yearly mammogram and they asked if she was having an issues.  She stated that her breasts had been "burning" with some pain in the right breast and what she feels like may be a "knot" near the armpit on the right.  Technician stated she should get a diagnostic mammogram.

## 2018-11-04 NOTE — Telephone Encounter (Signed)
Please call pt and see what the reason is for diagnostic mammogram (breast pain/mass/etc).  Let me know what details you can get.  If no issues, may only need screening mammogram.  Thanks.

## 2018-11-05 NOTE — Telephone Encounter (Signed)
She needs an OV- I need to check her first.  I can put the order in then.  Thanks.

## 2018-11-06 NOTE — Telephone Encounter (Signed)
Noted. Thanks. I'll defer.  

## 2018-11-06 NOTE — Telephone Encounter (Signed)
Spoke to patient, she stated she normally sees her OBGYN for issues such as this. Patient was offered an appointment but stated that she will call her OBGYN to see if she can get an appointment with them first. Patient advised to call back if her OBGYN is unable to see her and schedule an appointment with Dr. Damita Dunnings.  Will send to Dr. Damita Dunnings as Juluis Rainier

## 2018-11-10 DIAGNOSIS — N6310 Unspecified lump in the right breast, unspecified quadrant: Secondary | ICD-10-CM | POA: Diagnosis not present

## 2018-11-10 DIAGNOSIS — N644 Mastodynia: Secondary | ICD-10-CM | POA: Diagnosis not present

## 2018-11-11 ENCOUNTER — Other Ambulatory Visit: Payer: Self-pay | Admitting: Obstetrics and Gynecology

## 2018-11-11 DIAGNOSIS — N63 Unspecified lump in unspecified breast: Secondary | ICD-10-CM

## 2018-11-11 DIAGNOSIS — N644 Mastodynia: Secondary | ICD-10-CM

## 2018-11-13 ENCOUNTER — Ambulatory Visit: Payer: Medicare Other

## 2018-11-13 ENCOUNTER — Ambulatory Visit
Admission: RE | Admit: 2018-11-13 | Discharge: 2018-11-13 | Disposition: A | Discharge status: Home / Self Care | Payer: Medicare Other | Source: Ambulatory Visit | Attending: Obstetrics and Gynecology | Admitting: Obstetrics and Gynecology

## 2018-11-13 DIAGNOSIS — N63 Unspecified lump in unspecified breast: Secondary | ICD-10-CM

## 2018-11-13 DIAGNOSIS — N644 Mastodynia: Secondary | ICD-10-CM | POA: Diagnosis not present

## 2018-11-13 DIAGNOSIS — R922 Inconclusive mammogram: Secondary | ICD-10-CM | POA: Diagnosis not present

## 2018-11-17 ENCOUNTER — Ambulatory Visit (INDEPENDENT_AMBULATORY_CARE_PROVIDER_SITE_OTHER): Payer: Medicare Other | Admitting: Family Medicine

## 2018-11-17 ENCOUNTER — Encounter: Payer: Self-pay | Admitting: Family Medicine

## 2018-11-17 DIAGNOSIS — J0100 Acute maxillary sinusitis, unspecified: Secondary | ICD-10-CM

## 2018-11-17 MED ORDER — PREDNISONE 10 MG PO TABS: ORAL_TABLET | ORAL | 0 refills | Status: DC

## 2018-11-17 MED ORDER — AMOXICILLIN-POT CLAVULANATE 875-125 MG PO TABS: 1.0000 | ORAL_TABLET | Freq: Two times a day (BID) | ORAL | 0 refills | Status: DC

## 2018-11-17 NOTE — Assessment & Plan Note (Signed)
Nontoxic, okay for outpatient f/u.  Start augmentin, prednisone with routine cautions.  ctab Update me as needed.  She agrees.

## 2018-11-17 NOTE — Progress Notes (Signed)
"  I've been sick since January."  Worse sx in the last week.  Still on baseline inhalers/allergy meds, d/w pt.  Prev treated with amoxil.  Some ear pressure and facial pressure.  No vomiting.  No diarrhea.  Voice is hoarse.  Using nasal rinse.  Taking OTC cold med.  No fevers.  No sputum.  Some cough, dry.  Throat clearing from post nasal gtt.    She has tolerated augmentin in the past also.    Meds, vitals, and allergies reviewed.   ROS: Per HPI unless specifically indicated in ROS section   GEN: nad, alert and oriented HEENT: mucous membranes moist, tm w/o erythema, nasal exam w/o erythema, clear discharge noted,  OP with cobblestoning NECK: supple w/o LA CV: rrr.   PULM: ctab, no inc wob EXT: no edema SKIN: well perfused.  R max sinus ttp.

## 2018-11-17 NOTE — Patient Instructions (Signed)
Start augmentin, take prednisone with food.  Rest and fluids.  Update [place] as needed.  Take care.  Glad to see you.

## 2018-12-02 ENCOUNTER — Ambulatory Visit: Payer: Medicare Other | Admitting: Allergy and Immunology

## 2018-12-02 ENCOUNTER — Telehealth: Payer: Self-pay | Admitting: Allergy and Immunology

## 2018-12-02 NOTE — Telephone Encounter (Signed)
Please Send Meds to [facility]

## 2018-12-02 NOTE — Telephone Encounter (Signed)
Dr. Kim please advise 

## 2018-12-02 NOTE — Telephone Encounter (Signed)
Went to PCP on 11-17-18. For a sinus infection, congestion, and laryngitis. She was given amoxicillin, clavulanate, and prednisone. She has used a steam inhaler machine and the neti pot. Nothing seems to be clearing up. She says she has not had a fever, at all, with this.

## 2018-12-03 ENCOUNTER — Ambulatory Visit (INDEPENDENT_AMBULATORY_CARE_PROVIDER_SITE_OTHER): Payer: Medicare Other | Admitting: Allergy

## 2018-12-03 ENCOUNTER — Encounter: Payer: Self-pay | Admitting: Allergy

## 2018-12-03 ENCOUNTER — Other Ambulatory Visit: Payer: Self-pay

## 2018-12-03 DIAGNOSIS — J310 Chronic rhinitis: Secondary | ICD-10-CM | POA: Diagnosis not present

## 2018-12-03 DIAGNOSIS — K219 Gastro-esophageal reflux disease without esophagitis: Secondary | ICD-10-CM | POA: Diagnosis not present

## 2018-12-03 DIAGNOSIS — J4541 Moderate persistent asthma with (acute) exacerbation: Secondary | ICD-10-CM

## 2018-12-03 DIAGNOSIS — J019 Acute sinusitis, unspecified: Secondary | ICD-10-CM

## 2018-12-03 MED ORDER — LEVOFLOXACIN 500 MG PO TABS: 500.0000 mg | ORAL_TABLET | Freq: Every day | ORAL | 0 refills | Status: DC

## 2018-12-03 MED ORDER — PREDNISONE 10 MG PO TABS: ORAL_TABLET | ORAL | 0 refills | Status: DC

## 2018-12-03 NOTE — Patient Instructions (Addendum)
Sinus infection  Take Mucinex D up to twice a day for the next few days to help with the congestion and drainage. Take with plenty of fluids. If blood pressure goes up let us know and stop the medication.   Continue Qnasl 80 1-2 sprays each nostril one time per day.  May use Azelastine 1-2 spray twice a day as needed.   Continue with saline lavage 1-2 times a day.   If symptoms not improving with the Mucinex D and azelastine then may start Levaquin 500mg  daily for 10 days (antibiotics) and prednisone taper. [facility]   The Levaquin can cause some inflammation of the tendons and tendon rupture. If you notice these issues please stop the medication.   Moderate persistent asthma . Daily controller medication(s):  o Symbicort 160 2 puffs twice a day with spacer and rinse mouth afterwards.  o Montelukast 10mg  daily.  . Prior to physical activity: May use albuterol rescue inhaler 2 puffs 5 to 15 minutes prior to strenuous physical activities. Marland Kitchen Rescue medications: May use albuterol rescue inhaler 2 puffs or nebulizer every 4 to 6 hours as needed for shortness of breath, chest tightness, coughing, and wheezing. Monitor frequency of use.  . During upper respiratory infections: Start Asmanex 220 2 puffs daily for 1-2 weeks.  . Asthma control goals:  o Full participation in all desired activities (may need albuterol before activity) o Albuterol use two times or less a week on average (not counting use with activity) o Cough interfering with sleep two times or less a month o Oral steroids no more than once a year o No hospitalizations Chronic rhinitis  Continue Qnasl 80 1-2 sprays each nostril one time per day  May use over the counter antihistamines such as Zyrtec (cetirizine), Claritin (loratadine), Allegra (fexofenadine), or Xyzal (levocetirizine) daily as needed.  May use Azelastine 1-2 spray twice a day as needed.   LPRD (laryngopharyngeal reflux disease)  Continue  Pantoprazole 40 mg - one tablet one time per day in AM  Follow up in 3 months.

## 2018-12-03 NOTE — Telephone Encounter (Signed)
I would like to set up a telemedicine visit with her today if possible to assess her symptoms better.  Please set up and also call patient if that's okay with her for the afternoon once everybody is trained.   Thank you.

## 2018-12-03 NOTE — Progress Notes (Signed)
RE: Samantha [name] MRN: [id number] DOB: [birth year] Date of Telemedicine Visit: 12/03/2018  Referring provider: Tonia Ghent, MD Primary care provider: Tonia Ghent, MD  Chief Complaint: No chief complaint on file.  Follow Up Note via Telephone Call I connected with Samantha [name] on 12/03/2018 at 2:12PM EDT by telephone call and verified that I am speaking with the correct person using two identifiers. Patient is at home. Phone call ended at 2:40PM EDT.  I discussed the limitations of evaluation and management by telemedicine and the availability of in person appointments. The patient expressed understanding and agreed to proceed.  Verbal consent was obtained for telephone visit. Provider was at office.   History of Present Illness: She is a 73 y.o. female, who is being followed for asthma, allergic rhinitis and reflux. Her previous allergy office visit was on 07/23/2018 with Dr. Maudie Mercury.   Patient went to see PCP on 11/17/2018. She was started on prednisone and Augmentin for 10 days for a sinus infection with some benefit however she is still having nasal congestion, headaches, rhinorrhea, and sinus pain.  She did use sinus lavage and noticing that the discharge is clearing up. Denies any associated fevers or chills.   Patient taking Qnasl 2 sprays once a day and Singulair daily. She did not use the azelastine or mucolytic with this episode. Main concern for her is the nasal congestion.   Asthma: Currently on Symbicort 160 2 puffs twice a day and started using albuterol 2 puffs BID and Asmanex 2 puffs once a day when started to feel some respiratory symptoms. They do seem to help.   Reflux: Flared with prednisone and Augmentin. Stopped taking Pepcid as it was causing abdominal pains. Still taking Protonix 40mg  daily.    Assessment and Plan: Samantha [name] is a 73 y.o. female with: Acute non-recurrent sinusitis Finished a course of Augmentin and prednisone which helped but still  having significant sinus congestion.  Take Mucinex D up to twice a day for the next few days to help with the congestion and drainage. Take with plenty of fluids. If blood pressure goes up let us know and stop the medication.   Continue Qnasl 80 - 1-2 sprays each nostril one time per day.  May use Azelastine 1-2 spray twice a day as needed.   Continue with saline lavage 1-2 times a day.   If symptoms not improving with the Mucinex D and azelastine then may start Levaquin 500mg  daily for 10 days (antibiotics) and prednisone taper. Discussed side effects of medications.   Moderate persistent asthma with acute exacerbation Asthma seemed to be flared up with current URI.  Continue with the addition of Asmanex 220 2 puffs daily for the next 1-2 weeks.  . Daily controller medication(s):  o Symbicort 160 2 puffs twice a day with spacer and rinse mouth afterwards.  o Montelukast 10mg  daily.  . Prior to physical activity: May use albuterol rescue inhaler 2 puffs 5 to 15 minutes prior to strenuous physical activities. Marland Kitchen Rescue medications: May use albuterol rescue inhaler 2 puffs or nebulizer every 4 to 6 hours as needed for shortness of breath, chest tightness, coughing, and wheezing. Monitor frequency of use.  . During upper respiratory infections: Start Asmanex 220 2 puffs daily for 1-2 weeks.   Chronic rhinitis Past history - 2018 skin testing was negative.  Continue Qnasl 80 1-2 sprays each nostril one time per day  May use over the counter antihistamines such as Zyrtec (cetirizine), Claritin (loratadine),  Allegra (fexofenadine), or Xyzal (levocetirizine) daily as needed.  May use Azelastine 1-2 spray twice a day as needed.   LPRD (laryngopharyngeal reflux disease) Pepcid caused abdominal pains.   Continue Pantoprazole 40 mg - one tablet one time per day in AM  Return in about 3 months (around 03/05/2019).  Meds ordered this encounter  Medications  . levofloxacin (LEVAQUIN) 500 MG  tablet    Sig: Take 1 tablet (500 mg total) by mouth daily.    Dispense:  10 tablet    Refill:  0  . predniSONE (DELTASONE) 10 MG tablet    Sig: Take prednisone 30mg  once a day x 2 days, 20mg  once a day x 2 days, 10mg  once a day x 2 days.    Dispense:  12 tablet    Refill:  0   Diagnostics: None.  Medication List:  Current Outpatient Medications  Medication Sig Dispense Refill  . albuterol (PROVENTIL HFA) 108 (90 Base) MCG/ACT inhaler Inhale 2 puffs into the lungs every 6 (six) hours as needed. 3 Inhaler 3  . amoxicillin-clavulanate (AUGMENTIN) 875-125 MG tablet Take 1 tablet by mouth 2 (two) times daily. 20 tablet 0  . Ascorbic Acid (VITAMIN C) 500 MG tablet Take 500 mg by mouth daily.      . Azelastine HCl 0.15 % SOLN Place 2 sprays into both nostrils 2 (two) times daily as needed (runny nose). 90 mL 2  . Beclomethasone Dipropionate (QNASL) 80 MCG/ACT AERS Two sprays into both nostrils daily 3 Inhaler 1  . benzonatate (TESSALON) 100 MG capsule Take 1 capsule (100 mg total) by mouth 3 (three) times daily as needed for cough. 30 capsule 1  . bifidobacterium infantis (ALIGN) capsule Take 1 capsule by mouth daily.      . budesonide-formoterol (SYMBICORT) 160-4.5 MCG/ACT inhaler Inhale 2 puffs into the lungs 2 (two) times daily. 30.6 Inhaler 1  . cetirizine (ZYRTEC) 10 MG tablet Take 10 mg by mouth daily.    . Coenzyme Q-10 60 MG CAPS Take 1 capsule by mouth 2 (two) times daily.     . Cyanocobalamin (VITAMIN B-12) 1000 MCG SUBL Place 1 tablet under the tongue daily. Contains Folic Acid, B6, and Biotin    . levofloxacin (LEVAQUIN) 500 MG tablet Take 1 tablet (500 mg total) by mouth daily. 10 tablet 0  . losartan (COZAAR) 100 MG tablet Take 1 tablet (100 mg total) by mouth daily. 90 tablet 3  . Magnesium Malate POWD (1250mg  tabs) 1 tablet by mouth two times a day    . Manganese 50 MG TABS Take 50 mg by mouth.    . Misc Natural Products (TART CHERRY ADVANCED PO) Take by mouth.    .  mometasone (ASMANEX) 220 MCG/INH inhaler Inhale 1 puff into the lungs daily. 3 Inhaler 3  . montelukast (SINGULAIR) 10 MG tablet Take 1 tablet (10 mg total) by mouth daily. 90 tablet 1  . Olopatadine HCl (PATADAY) 0.2 % SOLN Place 1 drop into both eyes daily. 3 Bottle 3  . Omega-3 Fatty Acids (OMEGA 3 PO) Take one tablet daily    . pantoprazole (PROTONIX) 40 MG tablet Take 1 tablet (40 mg total) by mouth daily. 30 tablet 3  . predniSONE (DELTASONE) 10 MG tablet Take prednisone 30mg  once a day x 2 days, 20mg  once a day x 2 days, 10mg  once a day x 2 days. 12 tablet 0  . thiamine 100 MG tablet Take 100 mg by mouth daily.      Marland Kitchen  TURMERIC PO Take by mouth daily.     No current facility-administered medications for this visit.    Allergies: Allergies  Allergen Reactions  . Cefuroxime Axetil     REACTION: pt not sure of reaction- she can tolerate amoxil  . Doxycycline Itching  . Lovastatin Other (See Comments)    Aches   . Sulfonamide Derivatives     REACTION: pt not sure of reaction   I reviewed her past medical history, social history, family history, and environmental history and no significant changes have been reported from previous visit on 07/23/2018.  Review of Systems  Constitutional: Negative for appetite change, chills, fever and unexpected weight change.  HENT: Positive for congestion and sinus pressure. Negative for rhinorrhea.   Eyes: Negative for itching.  Respiratory: Positive for cough and chest tightness. Negative for shortness of breath and wheezing.   Gastrointestinal: Negative for abdominal pain.  Skin: Negative for rash.  Allergic/Immunologic: Positive for environmental allergies.  Neurological: Negative for headaches.   Objective: There were no vitals taken for this visit. There is no height or weight on file to calculate BMI. Physical Exam  Not obtained as encounter was done via telephone.   Previous notes and tests were reviewed.  I discussed the  assessment and treatment plan with the patient. The patient was provided an opportunity to ask questions and all were answered. The patient agreed with the plan and demonstrated an understanding of the instructions.  The patient was advised to call back or seek an in-person evaluation if the symptoms worsen or if the condition fails to improve as anticipated.  I provided 28 minutes of telephone call time during this encounter. AVS was emailed to patient's e-mail address: [email or web address]  It was my pleasure to see Samantha [name] today and participate in her care. Please feel free to contact me with any questions or concerns.  Sincerely,  Rexene Alberts, DO Allergy & Immunology  [facility] office: [phone] [facility] office: [phone]

## 2018-12-03 NOTE — Assessment & Plan Note (Signed)
Asthma seemed to be flared up with current URI.  Continue with the addition of Asmanex 220 2 puffs daily for the next 1-2 weeks.  . Daily controller medication(s):  o Symbicort 160 2 puffs twice a day with spacer and rinse mouth afterwards.  o Montelukast 10mg  daily.  . Prior to physical activity: May use albuterol rescue inhaler 2 puffs 5 to 15 minutes prior to strenuous physical activities. Marland Kitchen Rescue medications: May use albuterol rescue inhaler 2 puffs or nebulizer every 4 to 6 hours as needed for shortness of breath, chest tightness, coughing, and wheezing. Monitor frequency of use.  . During upper respiratory infections: Start Asmanex 220 2 puffs daily for 1-2 weeks.

## 2018-12-03 NOTE — Assessment & Plan Note (Addendum)
Finished a course of Augmentin and prednisone which helped but still having significant sinus congestion.  Take Mucinex D up to twice a day for the next few days to help with the congestion and drainage. Take with plenty of fluids. If blood pressure goes up let us know and stop the medication.   Continue Qnasl 80 - 1-2 sprays each nostril one time per day.  May use Azelastine 1-2 spray twice a day as needed.   Continue with saline lavage 1-2 times a day.   If symptoms not improving with the Mucinex D and azelastine then may start Levaquin 500mg  daily for 10 days (antibiotics) and prednisone taper. Discussed side effects of medications.

## 2018-12-03 NOTE — Assessment & Plan Note (Signed)
Past history - 2018 skin testing was negative.  Continue Qnasl 80 1-2 sprays each nostril one time per day  May use over the counter antihistamines such as Zyrtec (cetirizine), Claritin (loratadine), Allegra (fexofenadine), or Xyzal (levocetirizine) daily as needed.  May use Azelastine 1-2 spray twice a day as needed.

## 2018-12-03 NOTE — Assessment & Plan Note (Signed)
Pepcid caused abdominal pains.   Continue Pantoprazole 40 mg - one tablet one time per day in AM

## 2018-12-03 NOTE — Telephone Encounter (Signed)
Patient has been scheduled for televisit.

## 2018-12-17 ENCOUNTER — Telehealth: Payer: Self-pay | Admitting: Allergy

## 2018-12-17 NOTE — Telephone Encounter (Signed)
Patient was schedule for an appt with Kozlow on April 14, with her age, she was called to change it. She said she already had a phone visit with Dr. Maudie Mercury last week and was put on medication. She has finished the medication and is feeling better, but her issue is not cleared up yet. She would like to know what needs to be done.

## 2018-12-17 NOTE — Telephone Encounter (Signed)
Dr. Maudie Mercury do you have any further recommendations?

## 2018-12-17 NOTE — Telephone Encounter (Signed)
Please call patient.   Take Mucinex twice a day to loosen up the phlegm.  Use the azelastine nasal spray 2 sprays twice a day for post nasal drip.  Continue the Qnasl 2 sprays once a day.   Use saline irrigations twice a day prior to using the medicated nasal sprays.   Take ceterizine 10mg  in the morning and 14m at night if it doesn't cause too much drowsiness.    Continue Asmanex 220 2 puffs twice a day for another 2 weeks.  Continue Symbicort 160 2 puffs twice day  Continue montelukast 10mg  daily.    Drink plenty of fluids.  Water, juice, clear broth or warm lemon water are good choices. Avoid caffeine and alcohol, which can dehydrate you.  Soothe your throat.  Perform a saltwater gargle. Dissolve one-quarter to a half teaspoon of salt in a 4- to 8-ounce glass of warm water. This can relieve a sore or scratchy throat temporarily.  No need for additional antibiotics or prednisone at this time.

## 2018-12-18 NOTE — Telephone Encounter (Signed)
I did speak with patient 12-17-2018 and informed her of Dr. Julianne Rice recommendation. Patient requested that I text her the information and I told that unfortunately we are not allowed to do that. I did send her a text invite via Epic to get her mychart set up. Patient was to call back when she got that set up so I could copy & paste this into a message.

## 2018-12-18 NOTE — Telephone Encounter (Signed)
I have called again with no answer.I am mailing the information to the patient.

## 2018-12-23 ENCOUNTER — Ambulatory Visit: Payer: Medicare Other | Admitting: Allergy and Immunology

## 2018-12-26 ENCOUNTER — Other Ambulatory Visit: Payer: Self-pay | Admitting: Allergy and Immunology

## 2018-12-26 MED ORDER — ALBUTEROL SULFATE HFA 108 (90 BASE) MCG/ACT IN AERS: 2.0000 | INHALATION_SPRAY | RESPIRATORY_TRACT | 0 refills | Status: DC | PRN

## 2018-12-26 NOTE — Telephone Encounter (Signed)
Patient needs a 90 day supply with 3 refills sent to [facility] mail order pharmacy On Albuterol PRO-AIR HFA

## 2019-01-07 NOTE — Progress Notes (Signed)
Patient is at home. Provider is in office. Start Time:212p End Time:240p

## 2019-01-30 ENCOUNTER — Ambulatory Visit (INDEPENDENT_AMBULATORY_CARE_PROVIDER_SITE_OTHER): Payer: Medicare Other | Admitting: Family Medicine

## 2019-01-30 DIAGNOSIS — L299 Pruritus, unspecified: Secondary | ICD-10-CM

## 2019-01-30 DIAGNOSIS — R229 Localized swelling, mass and lump, unspecified: Secondary | ICD-10-CM

## 2019-01-30 DIAGNOSIS — R634 Abnormal weight loss: Secondary | ICD-10-CM | POA: Diagnosis not present

## 2019-01-30 NOTE — Progress Notes (Signed)
Virtual visit completed through WebEx or similar program Patient location: home  Provider location: [place] at [facility], office   Limitations and rationale for visit method d/w patient.  Patient agreed to proceed.   CC: weight loss, rash  HPI:  Swelling near R collar bone medially.  From her description it sounds like she has some irritation and puffiness near the right [place] joint.  She is a separate rash that started about 2 nights ago on the neck bilaterally.  That does around the neck, rash is itchy.  She reports some diffuse itching.    Weight is down to ~130.  No clear trigger for weight loss. No blood in stool except for one episode with scant blood with wiping.  No diarrhea.  Prev weight usually 135-140.  No CP, no BLE edema.  She has some fatigue noted.    Prev u/s with: Minimal increased liver echogenicity may be normal versus result of very mild fatty infiltration. No focal hepatic lesion. Remainder of exam unremarkable.  She has some occ gait changes with change in balance, usually to the R side.  Going on for months.  Not noted every time with walking.  No weakness, no foot drop.  She has occ dropping in R hand.  That has been going on for months.  No L sided sx.  No new sx today.    Meds and allergies reviewed.   ROS: Per HPI unless specifically indicated in ROS section   NAD Speech wnl  A/P:  Itching- can try changing zyrtec to benadryl 25mg  BID prn with sedation caution.    Swelling noted locally- this sounds to be an issue at the medial collarbone, at the [facility] joint.  We can address on follow up.    Weight loss, fatigue gait and grip changes, needed OV and we'll set that up.  ER cautions discussed with patient.

## 2019-02-02 DIAGNOSIS — R229 Localized swelling, mass and lump, unspecified: Secondary | ICD-10-CM | POA: Insufficient documentation

## 2019-02-02 DIAGNOSIS — R634 Abnormal weight loss: Secondary | ICD-10-CM | POA: Insufficient documentation

## 2019-02-02 DIAGNOSIS — L299 Pruritus, unspecified: Secondary | ICD-10-CM | POA: Insufficient documentation

## 2019-02-02 NOTE — Assessment & Plan Note (Signed)
Weight loss, fatigue gait and grip changes, needed OV and we'll set that up.  ER cautions discussed with patient.

## 2019-02-02 NOTE — Assessment & Plan Note (Signed)
Swelling noted locally- this sounds to be an issue at the medial collarbone, at the [facility] joint.  We can address on follow up.

## 2019-02-02 NOTE — Assessment & Plan Note (Signed)
Itching- can try changing zyrtec to benadryl 25mg  BID prn with sedation caution.

## 2019-02-03 ENCOUNTER — Ambulatory Visit (INDEPENDENT_AMBULATORY_CARE_PROVIDER_SITE_OTHER)
Admission: RE | Admit: 2019-02-03 | Discharge: 2019-02-03 | Disposition: A | Discharge status: Home / Self Care | Payer: Medicare Other | Source: Ambulatory Visit | Attending: Family Medicine | Admitting: Family Medicine

## 2019-02-03 ENCOUNTER — Ambulatory Visit (INDEPENDENT_AMBULATORY_CARE_PROVIDER_SITE_OTHER): Payer: Medicare Other | Admitting: Family Medicine

## 2019-02-03 ENCOUNTER — Other Ambulatory Visit: Payer: Self-pay

## 2019-02-03 ENCOUNTER — Encounter: Payer: Self-pay | Admitting: Family Medicine

## 2019-02-03 VITALS — BP 134/70 | HR 64 | Temp 97.6°F | Ht 62.0 in | Wt 132.0 lb

## 2019-02-03 DIAGNOSIS — L299 Pruritus, unspecified: Secondary | ICD-10-CM | POA: Diagnosis not present

## 2019-02-03 DIAGNOSIS — R29898 Other symptoms and signs involving the musculoskeletal system: Secondary | ICD-10-CM | POA: Diagnosis not present

## 2019-02-03 DIAGNOSIS — M898X1 Other specified disorders of bone, shoulder: Secondary | ICD-10-CM | POA: Diagnosis not present

## 2019-02-03 DIAGNOSIS — M19011 Primary osteoarthritis, right shoulder: Secondary | ICD-10-CM | POA: Diagnosis not present

## 2019-02-03 LAB — COMPREHENSIVE METABOLIC PANEL
ALT: 8 U/L (ref 0–35)
AST: 17 U/L (ref 0–37)
Albumin: 4.5 g/dL (ref 3.5–5.2)
Alkaline Phosphatase: 51 U/L (ref 39–117)
BUN: 13 mg/dL (ref 6–23)
CO2: 29 mEq/L (ref 19–32)
Calcium: 10.1 mg/dL (ref 8.4–10.5)
Chloride: 104 mEq/L (ref 96–112)
Creatinine, Ser: 0.9 mg/dL (ref 0.40–1.20)
GFR: 74.16 mL/min (ref >60.00)
Glucose, Bld: 92 mg/dL (ref 70–99)
Potassium: 4 mEq/L (ref 3.5–5.1)
Sodium: 140 mEq/L (ref 135–145)
Total Bilirubin: 0.6 mg/dL (ref 0.2–1.2)
Total Protein: 7.2 g/dL (ref 6.0–8.3)

## 2019-02-03 LAB — CBC WITH DIFFERENTIAL/PLATELET
Basophils Absolute: 0 10*3/uL (ref 0.0–0.1)
Basophils Relative: 0.7 % (ref 0.0–3.0)
Eosinophils Absolute: 0 10*3/uL (ref 0.0–0.7)
Eosinophils Relative: 0.6 % (ref 0.0–5.0)
HCT: 38 % (ref 36.0–46.0)
Hemoglobin: 13.5 g/dL (ref 12.0–15.0)
Lymphocytes Relative: 33.6 % (ref 12.0–46.0)
Lymphs Abs: 2 10*3/uL (ref 0.7–4.0)
MCHC: 35.5 g/dL (ref 30.0–36.0)
MCV: 85.3 fl (ref 78.0–100.0)
Monocytes Absolute: 0.3 10*3/uL (ref 0.1–1.0)
Monocytes Relative: 5.7 % (ref 3.0–12.0)
Neutro Abs: 3.4 10*3/uL (ref 1.4–7.7)
Neutrophils Relative %: 59.4 % (ref 43.0–77.0)
Platelets: 189 10*3/uL (ref 150.0–400.0)
RBC: 4.45 Mil/uL (ref 3.87–5.11)
RDW: 14.2 % (ref 11.5–15.5)
WBC: 5.8 10*3/uL (ref 4.0–10.5)

## 2019-02-03 LAB — CK: Total CK: 118 U/L (ref 7–177)

## 2019-02-03 LAB — TSH: TSH: 2.16 u[IU]/mL (ref 0.35–4.50)

## 2019-02-03 LAB — VITAMIN B12: Vitamin B-12: 1174 pg/mL — ABNORMAL HIGH (ref 211–911)

## 2019-02-03 NOTE — Progress Notes (Signed)
Itching- she tried benadryl 25mg  BID prn and that helped a little.  She checked her back this AM and saw some darker areas.    Swelling noted locally prev near the R [place] joint.  It is some better in the meantime. Not as painful as prev.    Weight loss, fatigue gait and episodic grip change noted by patient.  She had noted some episodic R hand sx.  Sometimes her grip is normal.  Grip changes started about 3 months ago.  No L sided hand sx.  Some balance changes noted in the last 3 months.  No foot drop.    Prev R axillary lesions noted by patient but resolved now. Prev with crusting per patient report but normal skin now.    PMH and SH reviewed  ROS: Per HPI unless specifically indicated in ROS section   Meds, vitals, and allergies reviewed.   nad ncat Mmm Neck supple, no LA R [place] joint slightly puffy.  L [place] joint not puffy.   R axilla wnl.  No crusting lesion seen.  No lymphadenopathy. rrr ctab No BLE Skin well perfused. CN 2-12 wnl B, S/S wnl x4 [place] wnl.  Normal grip B at time of exam.

## 2019-02-03 NOTE — Patient Instructions (Signed)
Don't change your meds for now.  Go to the lab on the way out.  We'll contact you with your lab and xray report. Take care.  Glad to see you.

## 2019-02-04 ENCOUNTER — Encounter: Payer: Self-pay | Admitting: Family Medicine

## 2019-02-04 ENCOUNTER — Other Ambulatory Visit: Payer: Self-pay | Admitting: Family Medicine

## 2019-02-04 DIAGNOSIS — G3281 Cerebellar ataxia in diseases classified elsewhere: Secondary | ICD-10-CM

## 2019-02-04 DIAGNOSIS — R29898 Other symptoms and signs involving the musculoskeletal system: Secondary | ICD-10-CM | POA: Insufficient documentation

## 2019-02-04 DIAGNOSIS — R269 Unspecified abnormalities of gait and mobility: Secondary | ICD-10-CM

## 2019-02-04 DIAGNOSIS — M898X1 Other specified disorders of bone, shoulder: Secondary | ICD-10-CM | POA: Insufficient documentation

## 2019-02-04 NOTE — Assessment & Plan Note (Signed)
No scleral icterus.  Reasonable to continue Benadryl 25 mg twice daily as needed for itching in the meantime.

## 2019-02-04 NOTE — Assessment & Plan Note (Signed)
Likely right-sided [place] joint irritation but reasonable to check plain films today.  See notes on imaging.

## 2019-02-04 NOTE — Assessment & Plan Note (Addendum)
She has had some nonspecific symptoms (mild weight loss, fatigue) along with reported gait and episodic grip changes.  She had noted some episodic R hand sx.  Sometimes her grip is normal.  Grip changes started about 3 months ago.  No L sided hand sx.  Some balance changes noted in the last 3 months.  No foot drop.    She has a normal neurologic exam today.  Discussed options.  Check routine labs.  She may need extra imaging beyond this.  >25 minutes spent in face to face time with patient, >50% spent in counselling or coordination of care.

## 2019-02-05 DIAGNOSIS — R1011 Right upper quadrant pain: Secondary | ICD-10-CM | POA: Diagnosis not present

## 2019-02-05 DIAGNOSIS — K219 Gastro-esophageal reflux disease without esophagitis: Secondary | ICD-10-CM | POA: Diagnosis not present

## 2019-02-05 DIAGNOSIS — R634 Abnormal weight loss: Secondary | ICD-10-CM | POA: Diagnosis not present

## 2019-02-05 DIAGNOSIS — R1314 Dysphagia, pharyngoesophageal phase: Secondary | ICD-10-CM | POA: Diagnosis not present

## 2019-02-05 DIAGNOSIS — Z86010 Personal history of colonic polyps: Secondary | ICD-10-CM | POA: Diagnosis not present

## 2019-02-16 ENCOUNTER — Ambulatory Visit
Admission: RE | Admit: 2019-02-16 | Discharge: 2019-02-16 | Disposition: A | Discharge status: Home / Self Care | Payer: Medicare Other | Source: Ambulatory Visit | Attending: Family Medicine | Admitting: Family Medicine

## 2019-02-16 DIAGNOSIS — R51 Headache: Secondary | ICD-10-CM | POA: Diagnosis not present

## 2019-02-16 DIAGNOSIS — R269 Unspecified abnormalities of gait and mobility: Secondary | ICD-10-CM

## 2019-02-16 DIAGNOSIS — R270 Ataxia, unspecified: Secondary | ICD-10-CM | POA: Diagnosis not present

## 2019-02-16 DIAGNOSIS — R2689 Other abnormalities of gait and mobility: Secondary | ICD-10-CM | POA: Diagnosis not present

## 2019-02-16 DIAGNOSIS — R29898 Other symptoms and signs involving the musculoskeletal system: Secondary | ICD-10-CM

## 2019-02-19 ENCOUNTER — Encounter: Payer: Self-pay | Admitting: Family Medicine

## 2019-02-19 ENCOUNTER — Telehealth: Payer: Self-pay | Admitting: Family Medicine

## 2019-02-19 ENCOUNTER — Other Ambulatory Visit: Payer: Self-pay

## 2019-02-19 ENCOUNTER — Ambulatory Visit (INDEPENDENT_AMBULATORY_CARE_PROVIDER_SITE_OTHER): Payer: Medicare Other

## 2019-02-19 DIAGNOSIS — Z23 Encounter for immunization: Secondary | ICD-10-CM

## 2019-02-19 NOTE — Telephone Encounter (Signed)
See result note.  

## 2019-02-19 NOTE — Progress Notes (Signed)
Spoke with patient prior to administration of Shingrix and explained that she should call her insurance first and verify that they will cover under her provider benefit and not her r/x benefit.    She was well informed that medicare policies and certain private policies only cover the shingrix under the medication benefit for 65 and older, and if this is the case she will have to get at the pharmacy in order to have them pay for it.  If they do not cover for [place] to administer here, she was informed that the out of pocket cost is somewhere in the range of 200.00 plus dollars per shot that she would be responsible for.   She states that she is sure that her insurance will cover because even if her UHC does not, [employer] always picks up the rest.  She was told to check on the phone prior to scheduling and again at the appointment prior to administration.  Patient states that she will assumeall out of pocket responsibility for the cost of the vaccine if for some reason they do not cover and insists on proceeding with today's administration.   After educating patient on the vaccine, shingrix was administered to patient's left deltoid IM.  VIS given.  Patient tolerated well.

## 2019-02-19 NOTE — Telephone Encounter (Signed)
Patient stated that Monday she had a CT scan done of her head and wanted to see if we have received the results.   Patient phone-  [phone]

## 2019-02-20 ENCOUNTER — Telehealth: Payer: Self-pay

## 2019-02-20 NOTE — Telephone Encounter (Signed)
[facility], to be tested for COVID. She is doing it just to make sure. She is not feeling sick (went over all of the screening symptoms) she just continues to have some nasal congestion. Otherwise she feels fine. Advised patient to let us know her results so we can update her chart and if [facility] is able to send [place] information that would be great. Patient understood. FYI to Dr. Damita Dunnings for documentation.

## 2019-02-20 NOTE — Telephone Encounter (Signed)
Noted. Thanks.  Will await update.   

## 2019-02-23 DIAGNOSIS — H40003 Preglaucoma, unspecified, bilateral: Secondary | ICD-10-CM | POA: Diagnosis not present

## 2019-02-25 ENCOUNTER — Telehealth: Payer: Self-pay | Admitting: Family Medicine

## 2019-02-25 NOTE — Telephone Encounter (Signed)
Patient's cousin passed away.  There will be a large crowd of people at the funeral service.  Patient want to know what Dr.Duncan's opinion is about her going to the funeral.

## 2019-02-27 NOTE — Telephone Encounter (Signed)
Spoke to pt. She decided not to go.

## 2019-02-27 NOTE — Telephone Encounter (Signed)
I am sorry to hear about her cousin.  Please give my condolences.  If she is going to go, I would distance from others, wear a mask, wash hands, and not hug other people.  I would follow the CDC guidelines for limiting exposure.  Thanks.

## 2019-03-02 DIAGNOSIS — H40003 Preglaucoma, unspecified, bilateral: Secondary | ICD-10-CM | POA: Diagnosis not present

## 2019-03-10 ENCOUNTER — Other Ambulatory Visit: Payer: Self-pay

## 2019-03-10 ENCOUNTER — Emergency Department (HOSPITAL_COMMUNITY): Payer: Medicare Other

## 2019-03-10 ENCOUNTER — Emergency Department (HOSPITAL_COMMUNITY)
Admission: EM | Admit: 2019-03-10 | Discharge: 2019-03-10 | Disposition: A | Discharge status: Home / Self Care | Payer: Medicare Other | Attending: Emergency Medicine | Admitting: Emergency Medicine

## 2019-03-10 ENCOUNTER — Telehealth: Payer: Self-pay

## 2019-03-10 DIAGNOSIS — R05 Cough: Secondary | ICD-10-CM | POA: Diagnosis not present

## 2019-03-10 DIAGNOSIS — Z91040 Latex allergy status: Secondary | ICD-10-CM | POA: Insufficient documentation

## 2019-03-10 DIAGNOSIS — Z20828 Contact with and (suspected) exposure to other viral communicable diseases: Secondary | ICD-10-CM | POA: Diagnosis not present

## 2019-03-10 DIAGNOSIS — R0789 Other chest pain: Secondary | ICD-10-CM | POA: Diagnosis not present

## 2019-03-10 DIAGNOSIS — R0602 Shortness of breath: Secondary | ICD-10-CM | POA: Diagnosis not present

## 2019-03-10 DIAGNOSIS — R509 Fever, unspecified: Secondary | ICD-10-CM | POA: Insufficient documentation

## 2019-03-10 DIAGNOSIS — J45909 Unspecified asthma, uncomplicated: Secondary | ICD-10-CM | POA: Insufficient documentation

## 2019-03-10 DIAGNOSIS — I10 Essential (primary) hypertension: Secondary | ICD-10-CM | POA: Diagnosis not present

## 2019-03-10 DIAGNOSIS — R079 Chest pain, unspecified: Secondary | ICD-10-CM | POA: Insufficient documentation

## 2019-03-10 DIAGNOSIS — Z79899 Other long term (current) drug therapy: Secondary | ICD-10-CM | POA: Insufficient documentation

## 2019-03-10 DIAGNOSIS — R059 Cough, unspecified: Secondary | ICD-10-CM

## 2019-03-10 LAB — CBC WITH DIFFERENTIAL/PLATELET
Abs Immature Granulocytes: 0.02 10*3/uL (ref 0.00–0.07)
Basophils Absolute: 0 10*3/uL (ref 0.0–0.1)
Basophils Relative: 1 %
Eosinophils Absolute: 0.1 10*3/uL (ref 0.0–0.5)
Eosinophils Relative: 1 %
HCT: 39.7 % (ref 36.0–46.0)
Hemoglobin: 13.7 g/dL (ref 12.0–15.0)
Immature Granulocytes: 0 %
Lymphocytes Relative: 30 %
Lymphs Abs: 1.9 10*3/uL (ref 0.7–4.0)
MCH: 29.6 pg (ref 26.0–34.0)
MCHC: 34.5 g/dL (ref 30.0–36.0)
MCV: 85.7 fL (ref 80.0–100.0)
Monocytes Absolute: 0.5 10*3/uL (ref 0.1–1.0)
Monocytes Relative: 7 %
Neutro Abs: 3.9 10*3/uL (ref 1.7–7.7)
Neutrophils Relative %: 61 %
Platelets: 212 10*3/uL (ref 150–400)
RBC: 4.63 MIL/uL (ref 3.87–5.11)
RDW: 12.8 % (ref 11.5–15.5)
WBC: 6.4 10*3/uL (ref 4.0–10.5)
nRBC: 0 % (ref 0.0–0.2)

## 2019-03-10 LAB — COMPREHENSIVE METABOLIC PANEL
ALT: 11 U/L (ref 0–44)
AST: 25 U/L (ref 15–41)
Albumin: 4.5 g/dL (ref 3.5–5.0)
Alkaline Phosphatase: 54 U/L (ref 38–126)
Anion gap: 9 (ref 5–15)
BUN: 14 mg/dL (ref 8–23)
CO2: 25 mmol/L (ref 22–32)
Calcium: 9.4 mg/dL (ref 8.9–10.3)
Chloride: 106 mmol/L (ref 98–111)
Creatinine, Ser: 0.93 mg/dL (ref 0.44–1.00)
GFR calc Af Amer: >60 mL/min (ref >60)
GFR calc non Af Amer: >60 mL/min (ref >60)
Glucose, Bld: 96 mg/dL (ref 70–99)
Potassium: 4.1 mmol/L (ref 3.5–5.1)
Sodium: 140 mmol/L (ref 135–145)
Total Bilirubin: 0.6 mg/dL (ref 0.3–1.2)
Total Protein: 7.9 g/dL (ref 6.5–8.1)

## 2019-03-10 LAB — URINALYSIS, ROUTINE W REFLEX MICROSCOPIC
Bilirubin Urine: NEGATIVE
Glucose, UA: NEGATIVE mg/dL
Hgb urine dipstick: NEGATIVE
Ketones, ur: NEGATIVE mg/dL
Leukocytes,Ua: NEGATIVE
Nitrite: NEGATIVE
Protein, ur: NEGATIVE mg/dL
Specific Gravity, Urine: 1.006 (ref 1.005–1.030)
pH: 8 (ref 5.0–8.0)

## 2019-03-10 LAB — BRAIN NATRIURETIC PEPTIDE: B Natriuretic Peptide: 46.2 pg/mL (ref 0.0–100.0)

## 2019-03-10 NOTE — Discharge Instructions (Addendum)
As discussed, your evaluation today has been largely reassuring.  But, it is important that you monitor your condition carefully, and do not hesitate to return to the ED if you develop new, or concerning changes in your condition.  In addition to your home medications, please consider using Naprosyn, up to 500 mg, twice daily for additional pain control.  This medication and all of your others should be discussed at length with your physician at your next visit.  Otherwise, please follow-up with your physician for appropriate ongoing care.  Your coronavirus test should be available within the next 2 days.

## 2019-03-10 NOTE — ED Provider Notes (Signed)
[facility] DEPT Provider Note   CSN: [id number] Arrival date & time: 03/10/19  1238    History   Chief Complaint Chief Complaint  Patient presents with  . Shortness of Breath  . Chest Pain  . Cough    HPI Samantha [name] is a 73 y.o. female.     HPI Patient presents with concern of cough, fever, shortness of breath. Patient is multiple ongoing long-term concerns, but was generally in her usual state of health until about 1 week ago. About that time she developed mild cough, right-sided inferior axillary discomfort, which is intermittent mild dyspnea. Since that time symptoms have been persistent, with no clear alleviating or exacerbating factors. Today she also found her temperature to be elevated beyond baseline, 99+. After speaking with the nurse she was sent here for evaluation. The chest pain is not clearly pleuritic, nor exertional. There is no syncope, no vomiting, no diarrhea. She has been exposed to healthcare settings, with a colonoscopy over the past weeks. Past Medical History:  Diagnosis Date  . ALLERGIC RHINITIS 07/26/2008  . ASTHMA 04/14/2007  . Asthma   . ASYMPTOMATIC POSTMENOPAUSAL STATUS 05/10/2008  . CHEST PAIN 08/26/2008  . COLONIC POLYPS, HX OF 04/14/2007   colonic leiomyoma  . Episcleritis   . FIBROIDS, UTERUS 05/10/2008  . Fibromyalgia   . GERD 04/14/2007  . HYPERCHOLESTEROLEMIA 01/07/2008  . HYPERGLYCEMIA 11/28/2009  . HYPERTENSION 01/28/2008  . INSOMNIA 05/10/2008  . Irritable bowel syndrome 04/06/2010  . OSTEOARTHRITIS 11/28/2009  . 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 SINUSITIS 02/10/2009    Patient Active Problem List   Diagnosis Date Noted  . Decreased grip strength 02/04/2019  . Clavicle pain 02/04/2019  . Itching 02/02/2019  . Local superficial swelling 02/02/2019  . Weight loss 02/02/2019  . Osteopenia 08/09/2018  . Chronic rhinitis 07/23/2018  . 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 Healthcare  maintenance 07/16/2018  . Moderate persistent asthma with acute exacerbation 01/30/2018  . Advance care planning 01/02/2018  . Vitamin D deficiency 07/08/2017  . 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/19/2017  . Primary osteoarthritis of both knees 01/19/2017  . Primary osteoarthritis of both feet 01/19/2017  . SOB (shortness of breath) 11/14/2016  . Diverticulosis 03/19/2016  . Rash 10/03/2015  . Lumbar compression fracture ([place]) 06/20/2015  . Fibromyalgia 02/26/2013  . Allergic rhinitis due to pollen 11/20/2010  . 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 IRRITABLE BOWEL SYNDROME 04/06/2010  . THYROID NODULE, RIGHT 12/07/2009  . OSTEOARTHRITIS 11/28/2009  . FIBROIDS, UTERUS 05/10/2008  . Insomnia 05/10/2008  . ASYMPTOMATIC POSTMENOPAUSAL STATUS 05/10/2008  . Essential hypertension 01/28/2008  . HYPERCHOLESTEROLEMIA 01/07/2008  . GLAUCOMA 01/07/2008  . Gastroesophageal reflux disease 04/14/2007  . Asthma 04/14/2007    Past Surgical History:  Procedure Laterality Date  . COLONOSCOPY W/ POLYPECTOMY    . ELECTROCARDIOGRAM  12/04/2006  . IR RADIOLOGY PERIPHERAL GUIDED IV START  04/21/2018  . IR US GUIDE VASC ACCESS LEFT  04/21/2018  . NASAL SEPTUM SURGERY    . Stress Cardiolite  02/13/2002     OB History   No obstetric history on file.      Home Medications    Prior to Admission medications   Medication Sig Start Date End Date Taking? Authorizing Provider  montelukast (SINGULAIR) 10 MG tablet Take 1 tablet (10 mg total) by mouth daily. Patient taking differently: Take 10 mg by mouth at bedtime.  10/28/18  Yes Garnet Sierras, DO  albuterol (  VENTOLIN HFA) 108 (90 Base) MCG/ACT inhaler Inhale 2 puffs into the lungs every 4 (four) hours as needed. 12/26/18   Garnet Sierras, DO  Azelastine HCl 0.15 % SOLN Place 2 sprays into both nostrils 2 (two) times daily as needed (runny nose). 07/23/18   Garnet Sierras, DO  Beclomethasone Dipropionate (QNASL) 80 MCG/ACT AERS  Two sprays into both nostrils daily 10/28/18   Garnet Sierras, DO  benzonatate (TESSALON) 100 MG capsule Take 1 capsule (100 mg total) by mouth 3 (three) times daily as needed for cough. 01/07/18   Tonia Ghent, MD  bifidobacterium infantis (ALIGN) capsule Take 1 capsule by mouth daily.      [provider]  budesonide-formoterol (SYMBICORT) 160-4.5 MCG/ACT inhaler Inhale 2 puffs into the lungs 2 (two) times daily. 10/28/18   Garnet Sierras, DO  cetirizine (ZYRTEC) 10 MG tablet Take 10 mg by mouth daily.    [provider]  Coenzyme Q-10 60 MG CAPS Take 1 capsule by mouth 2 (two) times daily.     [provider]  Cyanocobalamin (VITAMIN B-12) 1000 MCG SUBL Place 1 tablet under the tongue daily. Contains Folic Acid, B6, and Biotin    [provider]  losartan (COZAAR) 100 MG tablet Take 1 tablet (100 mg total) by mouth daily. 07/14/18   Tonia Ghent, MD  Magnesium Malate POWD (1250mg  tabs) 1 tablet by mouth two times a day    [provider]  Manganese 50 MG TABS Take 50 mg by mouth.    [provider]  Misc Natural Products (TART CHERRY ADVANCED PO) Take by mouth.    [provider]  mometasone (ASMANEX) 220 MCG/INH inhaler Inhale 1 puff into the lungs daily. 01/21/18   Kozlow, Donnamarie Poag, MD  Olopatadine HCl (PATADAY) 0.2 % SOLN Place 1 drop into both eyes daily. 12/04/17   Kozlow, Donnamarie Poag, MD  Omega-3 Fatty Acids (OMEGA 3 PO) Take one tablet daily    [provider]  pantoprazole (PROTONIX) 40 MG tablet Take 1 tablet (40 mg total) by mouth daily. 02/01/17   Kozlow, Donnamarie Poag, MD  thiamine 100 MG tablet Take 100 mg by mouth daily.      [provider]  TURMERIC PO Take by mouth daily.    [provider]    Family History Family History  Problem Relation Age of Onset  . Heart disease Father        CHF  . Allergic rhinitis Father   . Asthma Father   . Allergic rhinitis Sister   . Asthma Sister   . Allergic  rhinitis Brother   . Asthma Brother   . Allergic rhinitis Maternal Aunt   . Asthma Maternal Aunt   . Allergic rhinitis Paternal Aunt   . Asthma Paternal Aunt   . Breast cancer Cousin   . Colon cancer Cousin   . Cancer Neg Hx        No FH of Colon Cancer  . Angioedema Neg Hx   . Atopy Neg Hx   . Immunodeficiency Neg Hx   . Urticaria Neg Hx   . Eczema Neg Hx     Social History Social History   Tobacco Use  . Smoking status: Never Smoker  . Smokeless tobacco: Never Used  Substance Use Topics  . Alcohol use: No  . Drug use: No     Allergies   Cefuroxime axetil, Doxycycline, Latex, Lovastatin, and Sulfonamide derivatives   Review of Systems  Review of Systems  Constitutional:       Per HPI, otherwise negative  HENT:       Per HPI, otherwise negative  Respiratory:       Per HPI, otherwise negative  Cardiovascular:       In addition to HPI, she had a recent episode of pain about the right upper chest, primarily about the clavicle, this has improved.  Gastrointestinal: Negative for vomiting.  Endocrine:       Negative aside from HPI  Genitourinary:       Neg aside from HPI   Musculoskeletal:       Per HPI, otherwise negative  Skin: Negative.   Neurological: Negative for syncope.     Physical Exam Updated Vital Signs BP (!) 175/55   Pulse 66   Temp 98.3 F (36.8 C) (Oral)   Resp 19   Ht 5\' 2"  (1.575 m)   Wt 59.4 kg   SpO2 98%   BMI 23.96 kg/m   Physical Exam Vitals signs and nursing note reviewed.  Constitutional:      General: She is not in acute distress.    Appearance: She is well-developed.  HENT:     Head: Normocephalic and atraumatic.  Eyes:     Conjunctiva/sclera: Conjunctivae normal.  Cardiovascular:     Rate and Rhythm: Normal rate and regular rhythm.  Pulmonary:     Effort: Pulmonary effort is normal. No respiratory distress.     Breath sounds: Normal breath sounds. No stridor.  Chest:     Comments: No appreciable mass, nor  substantial tenderness in the right upper chest around the clavicle. Abdominal:     General: There is no distension.  Skin:    General: Skin is warm and dry.  Neurological:     Mental Status: She is alert and oriented to person, place, and time.     Cranial Nerves: No cranial nerve deficit.      ED Treatments / Results  Labs (all labs ordered are listed, but only abnormal results are displayed) Labs Reviewed  URINALYSIS, ROUTINE W REFLEX MICROSCOPIC - Abnormal; Notable for the following components:      Result Value   Color, Urine STRAW (*)    All other components within normal limits  NOVEL CORONAVIRUS, NAA (HOSPITAL ORDER, SEND-OUT TO REF LAB)  CBC WITH DIFFERENTIAL/PLATELET  BRAIN NATRIURETIC PEPTIDE  COMPREHENSIVE METABOLIC PANEL    EKG EKG Interpretation  Date/Time:  Tuesday March 10 2019 12:52:50 EDT Ventricular Rate:  90 PR Interval:    QRS Duration: 85 QT Interval:  365 QTC Calculation: 447 R Axis:   -22 Text Interpretation:  Sinus rhythm Biatrial enlargement Borderline left axis deviation Confirmed by Carmin Muskrat [phone]) on 03/10/2019 1:18:29 PM   Radiology Dg Chest Port 1 View  Result Date: 03/10/2019 CLINICAL DATA:  Cough, chest pain and shortness of breath for 1 week, history asthma, hypertension EXAM: PORTABLE CHEST 1 VIEW COMPARISON:  Portable exam 1331 hours compared to 11/14/2016 FINDINGS: Enlargement of cardiac silhouette. Mediastinal contours and pulmonary vascularity normal. Atherosclerotic calcification aorta. Lungs clear. No infiltrate, pleural effusion or pneumothorax. Bones demineralized. IMPRESSION: Enlargement of cardiac silhouette without acute infiltrate. Electronically Signed   By: Lavonia Dana M.D.   On: 03/10/2019 13:52    Procedures Procedures (including critical care time)  Medications Ordered in ED Medications - No data to display   Initial Impression / Assessment and Plan / ED Course  I have reviewed the triage vital signs and  the  nursing notes.  Pertinent labs & imaging results that were available during my care of the patient were reviewed by me and considered in my medical decision making (see chart for details).        4:14 PM Patient in no distress, awake, alert, no hypoxia, no hemodynamic instability. We had a lengthy conversation about all findings including reassuring x-ray, labs. Considerations for her multiple complaints include viral illness, including possible coronavirus, though this is less likely given the absence of fever, discomfort, distress. Polypharmacy is an additional consideration as is her fibromyalgia. Absent hemodynamic instability, no distress, the patient was discharged in stable condition with outpatient follow-up this week. COVID test is pending.  Oliana Gowens was evaluated in Emergency Department on 03/10/2019 for the symptoms described in the history of present illness. She was evaluated in the context of the global COVID-19 pandemic, which necessitated consideration that the patient might be at risk for infection with the SARS-CoV-2 virus that causes COVID-19. Institutional protocols and algorithms that pertain to the evaluation of patients at risk for COVID-19 are in a state of rapid change based on information released by regulatory bodies including the CDC and federal and state organizations. These policies and algorithms were followed during the patient's care in the ED.   Final Clinical Impressions(s) / ED Diagnoses   Final diagnoses:  Cough  Atypical chest pain      Carmin Muskrat, MD 03/10/19 [phone]

## 2019-03-10 NOTE — Telephone Encounter (Signed)
Pt calls with CP, tightness and SOB on and off for 1 wk. Pt has non prod cough,fever,nausea, rt side pain, congestion,H/A. Due to possible covid symptoms pt is going to drive herself to [facility] ED. I asked if could call someone to drive pt or call [phone]. Pt said she did not need an ambulance, and there was no one to call. Pt said she would be fine to drive. I advised if she began to feel worse to pull over and call 911. Pt voiced understanding. FYI to Dr Damita Dunnings.

## 2019-03-10 NOTE — ED Triage Notes (Signed)
Patient c/o cough, chest pain, and SOB x 1 week. Patient states she called her physician and was told t come to the ED to rule out Covid-19.

## 2019-03-10 NOTE — ED Notes (Signed)
EDP at bedside  

## 2019-03-11 NOTE — Telephone Encounter (Signed)
Noted. Thanks.  Will await the ER notes.

## 2019-03-12 LAB — NOVEL CORONAVIRUS, NAA (HOSP ORDER, SEND-OUT TO REF LAB; TAT 18-24 HRS): SARS-CoV-2, NAA: NOT DETECTED

## 2019-03-19 ENCOUNTER — Ambulatory Visit (INDEPENDENT_AMBULATORY_CARE_PROVIDER_SITE_OTHER): Payer: Medicare Other | Admitting: Neurology

## 2019-03-19 ENCOUNTER — Other Ambulatory Visit: Payer: Self-pay

## 2019-03-19 ENCOUNTER — Encounter: Payer: Self-pay | Admitting: Neurology

## 2019-03-19 VITALS — BP 168/75 | HR 70 | Temp 98.0°F | Ht 62.0 in | Wt 132.0 lb

## 2019-03-19 DIAGNOSIS — R51 Headache: Secondary | ICD-10-CM | POA: Diagnosis not present

## 2019-03-19 DIAGNOSIS — R202 Paresthesia of skin: Secondary | ICD-10-CM | POA: Diagnosis not present

## 2019-03-19 DIAGNOSIS — R519 Headache, unspecified: Secondary | ICD-10-CM

## 2019-03-19 NOTE — Progress Notes (Signed)
PATIENT: Samantha [name] DOB: 08/24/1946  Chief Complaint  Patient presents with  . Decreased grip strength    Reports decreased strength in her bilateral upper extremities and hands for years.  Her symptoms have become worse recently, especially in her right hand.    . Headache    She has daily mild, headaches.  She will sometimes take Tylenol which helps the pain.   Marland Kitchen PCP    Tonia Ghent, MD     HISTORICAL  Samantha [name] is of 73 year old female, seen in request by her primary care physician Dr. Elsie Stain for evaluation of right wrist pain, right hand paresthesia, weakness, frequent headaches, initial evaluation was on March 19, 2019.  I have reviewed and summarized the referring note from the referring physician.  She had a past medical history of hypertension, disease, fibromyalgia, asthma, hiatal hernia,  Since 2018, she began to notice intermittent right hand paresthesia, weakness, drop things from her right hand, she was seen by outside neurologist, was diagnosed with carpal tunnel syndromes, a while, her symptoms has much improved, but began to have recurrent right hand paresthesia mainly involving first 4 fingers, weak grip, left hand thumb involvement, but much milder,  She also complains of frequent headaches, was seen by ophthalmologist 6 months ago was told to be normal, during her headaches, she complains of bilateral frontal, facial even jaw pain, tired to talk during headaches, she denies visual loss, headache has improved,, intermittent, couple times a week, she has tried over-the-counter ibuprofen with some help   REVIEW OF SYSTEMS: Full 14 system review of systems performed and notable only for as above All other review of systems were negative.  ALLERGIES: Allergies  Allergen Reactions  . Cefuroxime Axetil     REACTION: pt not sure of reaction- she can tolerate amoxil  . Doxycycline Itching  . Latex Itching  . Lovastatin Other (See Comments)   Muscle aches   . Sulfonamide Derivatives     REACTION: pt not sure of reaction    HOME MEDICATIONS: Current Outpatient Medications  Medication Sig Dispense Refill  . acetaminophen (TYLENOL) 500 MG tablet Take 1,000 mg by mouth every 6 (six) hours as needed for mild pain or headache.    . albuterol (VENTOLIN HFA) 108 (90 Base) MCG/ACT inhaler Inhale 2 puffs into the lungs every 4 (four) hours as needed. (Patient taking differently: Inhale 2 puffs into the lungs every 4 (four) hours as needed for wheezing or shortness of breath. ) 3 Inhaler 0  . Azelastine HCl 0.15 % SOLN Place 2 sprays into both nostrils 2 (two) times daily as needed (runny nose). 90 mL 2  . Beclomethasone Dipropionate (QNASL) 80 MCG/ACT AERS Two sprays into both nostrils daily (Patient taking differently: Place 1 spray into both nostrils daily. ) 3 Inhaler 1  . bifidobacterium infantis (ALIGN) capsule Take 1 capsule by mouth daily.      . budesonide-formoterol (SYMBICORT) 160-4.5 MCG/ACT inhaler Inhale 2 puffs into the lungs 2 (two) times daily. 30.6 Inhaler 1  . cetirizine (ZYRTEC) 10 MG tablet Take 10 mg by mouth at bedtime.     . Coenzyme Q-10 60 MG CAPS Take 60 capsules by mouth daily.     Marland Kitchen losartan-hydrochlorothiazide (HYZAAR) 100-12.5 MG tablet Take 1 tablet by mouth daily.    . Magnesium Malate POWD (1271m tabs) 1 tablet by mouth two times a day    . Manganese 50 MG TABS Take 50 mg by mouth at bedtime.     .Marland Kitchen  Misc Natural Products (TART CHERRY ADVANCED PO) Take 2 tablets by mouth daily.     . montelukast (SINGULAIR) 10 MG tablet Take 1 tablet (10 mg total) by mouth daily. (Patient taking differently: Take 10 mg by mouth at bedtime. ) 90 tablet 1  . Olopatadine HCl (PATADAY) 0.2 % SOLN Place 1 drop into both eyes daily. (Patient taking differently: Place 1 drop into both eyes daily as needed (itchy eyes). ) 3 Bottle 3  . Omega-3 Fatty Acids (OMEGA 3 PO) Take 1 capsule by mouth daily.     . pantoprazole (PROTONIX) 40 MG  tablet Take 40 mg by mouth daily.    Vladimir Faster Glycol-Propyl Glycol (SYSTANE) 0.4-0.3 % GEL ophthalmic gel Place 1 application into both eyes every 6 (six) hours as needed (dry eyes).    . thiamine 100 MG tablet Take 100 mg by mouth daily with supper.     . TURMERIC PO Take 1 tablet by mouth 2 (two) times a day.     Marland Kitchen VITAMIN D PO Take 1 tablet by mouth daily.     No current facility-administered medications for this visit.     PAST MEDICAL HISTORY: Past Medical History:  Diagnosis Date  . ALLERGIC RHINITIS 07/26/2008  . ASTHMA 04/14/2007  . Asthma   . ASYMPTOMATIC POSTMENOPAUSAL STATUS 05/10/2008  . CHEST PAIN 08/26/2008  . COLONIC POLYPS, HX OF 04/14/2007   colonic leiomyoma  . Decreased grip strength   . Episcleritis   . FIBROIDS, UTERUS 05/10/2008  . Fibromyalgia   . Gait abnormality   . GERD 04/14/2007  . HYPERCHOLESTEROLEMIA 01/07/2008  . HYPERGLYCEMIA 11/28/2009  . HYPERTENSION 01/28/2008  . INSOMNIA 05/10/2008  . Irritable bowel syndrome 04/06/2010  . OSTEOARTHRITIS 11/28/2009  . 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 SINUSITIS 02/10/2009    PAST SURGICAL HISTORY: Past Surgical History:  Procedure Laterality Date  . COLONOSCOPY W/ POLYPECTOMY    . ELECTROCARDIOGRAM  12/04/2006  . IR RADIOLOGY PERIPHERAL GUIDED IV START  04/21/2018  . IR US GUIDE VASC ACCESS LEFT  04/21/2018  . NASAL SEPTUM SURGERY    . Stress Cardiolite  02/13/2002    FAMILY HISTORY: Family History  Problem Relation Age of Onset  . Diabetes Mother   . Hypertension Mother   . Glaucoma Mother   . Polymyositis Mother   . Heart disease Father        CHF  . Allergic rhinitis Father   . Asthma Father   . Other Father        sideoblastic anemia  . Allergic rhinitis Sister   . Asthma Sister   . Allergic rhinitis Brother   . Asthma Brother   . Allergic rhinitis Maternal Aunt   . Asthma Maternal Aunt   . Allergic rhinitis Paternal Aunt   . Asthma Paternal Aunt   . Breast cancer Cousin   . Colon cancer  Cousin   . Cancer Neg Hx        No FH of Colon Cancer  . Angioedema Neg Hx   . Atopy Neg Hx   . Immunodeficiency Neg Hx   . Urticaria Neg Hx   . Eczema Neg Hx     SOCIAL HISTORY: Social History   Socioeconomic History  . Marital status: Widowed    Spouse name: Not on file  . Number of children: 3  . Years of education: some college  . Highest education level: Not on file  Occupational History    Employer: RETIRED    Comment: Retired (  [employer])  Social Needs  . Financial resource strain: Not on file  . Food insecurity    Worry: Not on file    Inability: Not on file  . Transportation needs    Medical: Not on file    Non-medical: Not on file  Tobacco Use  . Smoking status: Never Smoker  . Smokeless tobacco: Never Used  Substance and Sexual Activity  . Alcohol use: No  . Drug use: No  . Sexual activity: Never  Lifestyle  . Physical activity    Days per week: Not on file    Minutes per session: Not on file  . Stress: Not on file  Relationships  . Social Herbalist on phone: Not on file    Gets together: Not on file    Attends religious service: Not on file    Active member of club or organization: Not on file    Attends meetings of clubs or organizations: Not on file    Relationship status: Not on file  . Intimate partner violence    Fear of current or ex partner: Not on file    Emotionally abused: Not on file    Physically abused: Not on file    Forced sexual activity: Not on file  Other Topics Concern  . Not on file  Social History Narrative   Lives alone (widowed 1998).   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      Does not have living will.  Does desire CPR.  Does not want life support for prolonged periods of time if futile.   2 sons and 1 daughter.        Right-handed.   No daily caffeine use.     PHYSICAL EXAM   Vitals:   03/19/19 1047  BP: (!) 168/75  Pulse: 70  Temp: 98 F (36.7 C)  Weight: 132 lb (59.9 kg)  Height:  5' 2"  (1.575 m)    Not recorded      Body mass index is 24.14 kg/m.  PHYSICAL EXAMNIATION:  Gen: NAD, conversant, well nourised, obese, well groomed                     Cardiovascular: Regular rate rhythm, no peripheral edema, warm, nontender. Eyes: Conjunctivae clear without exudates or hemorrhage Neck: Supple, no carotid bruits. Pulmonary: Clear to auscultation bilaterally   NEUROLOGICAL EXAM:  MENTAL STATUS: Speech:    Speech is normal; fluent and spontaneous with normal comprehension.  Cognition:     Orientation to time, place and person     Normal recent and remote memory     Normal Attention span and concentration     Normal Language, naming, repeating,spontaneous speech     Fund of knowledge   CRANIAL NERVES: CN II: Visual fields are full to confrontation.Pupils are round equal and briskly reactive to light. CN III, IV, VI: extraocular movement are normal. No ptosis. CN V: Facial sensation is intact to pinprick in all 3 divisions bilaterally. Corneal responses are intact.  CN VII: Face is symmetric with normal eye closure and smile. CN VIII: Hearing is normal to rubbing fingers CN IX, X: Palate elevates symmetrically. Phonation is normal. CN XI: Head turning and shoulder shrug are intact CN XII: Tongue is midline with normal movements and no atrophy.  MOTOR: Right wrist pain, mild weakness at right abductor pollicis brevis, opponens,  right wrist Tine signl  REFLEXES: Reflexes are 2+ and symmetric at the  biceps, triceps, knees, and ankles. Plantar responses are flexor.  SENSORY: Decreased to pinprick at right first 4 finger pads  COORDINATION: Rapid alternating movements and fine finger movements are intact. There is no dysmetria on finger-to-nose and heel-knee-shin.    GAIT/STANCE: Posture is normal. Gait is steady with normal steps, base, arm swing, and turning. Heel and toe walking are normal. Tandem gait is normal.  Romberg is absent.   DIAGNOSTIC  DATA (LABS, IMAGING, TESTING) - I reviewed patient records, labs, notes, testing and imaging myself where available.   ASSESSMENT AND PLAN  Samantha [name] is a 73 y.o. female   Right hand paresthesia and weakness  Most consistent with right carpal tunnel syndromes  Proceed with EMG nerve conduction study  Wear right wrist splint  Headaches  ESR C-reactive protein to rule out temporal arteritis,     Marcial Pacas, M.D. Ph.D.  [facility] [address] [place], [place] [address] Ph: [phone] Fax: [phone]  CC:  Tonia Ghent, MD

## 2019-03-20 LAB — SEDIMENTATION RATE: Sed Rate: 10 mm/hr (ref 0–40)

## 2019-03-20 LAB — C-REACTIVE PROTEIN: CRP: <1 mg/L (ref 0–10)

## 2019-04-06 ENCOUNTER — Ambulatory Visit (INDEPENDENT_AMBULATORY_CARE_PROVIDER_SITE_OTHER): Payer: Medicare Other | Admitting: Family Medicine

## 2019-04-06 DIAGNOSIS — J029 Acute pharyngitis, unspecified: Secondary | ICD-10-CM

## 2019-04-06 MED ORDER — SUCRALFATE 1 G PO TABS: 1.0000 g | ORAL_TABLET | Freq: Two times a day (BID) | ORAL | Status: DC

## 2019-04-06 NOTE — Progress Notes (Signed)
Virtual visit completed through WebEx or similar program Patient location: home  Provider location: Financial controller at [facility], office   Pandemic considerations d/w pt.   Limitations and rationale for visit method d/w patient.  Patient agreed to proceed.   CC: ST.   HPI: sx started about 1 month ago.  Was seen with neg covid test and neg CXR.  She was feeling some better but then got ST, chest tightness responsive to SABA and hoarse voice.  Throat is irritated.  Recent sx started in the last few days.    She thought some of her inhalers were irritating her throat, in spite of rinsing.  SABA doesn't burn her throat.  No fevers, no vomiting.  No sputum.  Some occ dry cough.  She has some HA and nasal congestion.   Meds and allergies reviewed.   ROS: Per HPI unless specifically indicated in ROS section   NAD Speech wnl but voice is slightly hoarse.  A/P: Sore throat. discussed with patient about options. Stop asmanex and symbicort for now and continue prn SABA and update me in 1-2 days.  Can gargle with salt water.  She agrees.    If she has recurrent voice change we can refer to ENT.  She has seen Dr. Jaclynn Major.    Continue GERD meds for now as that can contribute.  Discussed with patient.  She agrees.

## 2019-04-08 DIAGNOSIS — J029 Acute pharyngitis, unspecified: Secondary | ICD-10-CM | POA: Insufficient documentation

## 2019-04-08 MED ORDER — BUDESONIDE-FORMOTEROL FUMARATE 160-4.5 MCG/ACT IN AERO: INHALATION_SPRAY | RESPIRATORY_TRACT | Status: DC

## 2019-04-08 NOTE — Assessment & Plan Note (Addendum)
Sore throat. discussed with patient about options. Stop asmanex and symbicort for now and continue prn SABA and update me in 1-2 days.  Can gargle with salt water.  She agrees.    If she has recurrent voice change we can refer to ENT.  She has seen Dr. Jaclynn Major.    Continue GERD meds for now as that can contribute.  Discussed with patient.  She agrees.  Okay for outpatient follow-up.

## 2019-04-16 ENCOUNTER — Telehealth: Payer: Self-pay | Admitting: Family Medicine

## 2019-04-16 MED ORDER — BUDESONIDE-FORMOTEROL FUMARATE 160-4.5 MCG/ACT IN AERO: 2.0000 | INHALATION_SPRAY | Freq: Two times a day (BID) | RESPIRATORY_TRACT | Status: DC

## 2019-04-16 MED ORDER — FIRST-DUKES MOUTHWASH MT SUSP: 5.0000 mL | Freq: Four times a day (QID) | OROMUCOSAL | 1 refills | Status: DC | PRN

## 2019-04-16 NOTE — Telephone Encounter (Signed)
Best number [phone]  Pt called she had virtual appointment with dr Damita Dunnings 7/27 he took her off of 2 meds and wanted to know how she was doing after stopping meds/  She stated she is still having problems with coughing  Her throat is better  Before her throat got better she noticed 3 white bumps on tonsil.  The white  bumps are gone and she still has laryngitis.  She is using her albuterol more often.  She is wanting to know what she needs to do  [facility] [address]

## 2019-04-16 NOTE — Telephone Encounter (Signed)
Please review

## 2019-04-16 NOTE — Telephone Encounter (Signed)
Patient called back and is asking if Magic Mouthwash can be called in to [employer].

## 2019-04-16 NOTE — Telephone Encounter (Signed)
Mouthwash rx sent.  D/w pt.  Issues below d/w pt.  Throat is better in the meantime.  Still with cough.  SABA use helps.  D/w pt about restarting symbicort and holding asmanex, to see if that helps with cough without irritating her throat. No fevers.  No discolored sputum.  Still okay for outpatient f/u.  Speaking in complete sentences.  I asked her to update me about her situation next week, sooner if needed. She agrees.

## 2019-04-21 NOTE — Telephone Encounter (Signed)
Spoke with patient.  She should not get her Shingrix until her symptoms have completely resolved.  We have rescheduled her shingrix until September.    She does want Dr. Damita Dunnings to know that her throat is feeling better but she still has the laryngitis. In addition, is still complaining of ongoing shortness of breath (able to speak without windedness on the phone), and chest "hurts" but this is not new and is ongoing.    She denies any new acute symptoms and cough is persistent, sometimes prod with clear sputum.  No fever, no chest congestion or wheezing that she is aware of.    Will forward this update to Dr. Damita Dunnings.

## 2019-04-21 NOTE — Telephone Encounter (Signed)
Agree about shingrix.  Given the ongoing sx, is it possible to get her added onto the schedule in the near future to check in person?  I think that would be best.  Thanks.

## 2019-04-21 NOTE — Telephone Encounter (Signed)
Patient called in regards to her shingrix vaccine she is suppose to be getting tomorrow She stated that her throat feels better but she is still having a cough and some congestion. She stated she missed the first dose of this and wanted to know if you think it is best to wait to get this until her cough and congestion goes away completely   Patient requested a call back   C/B # [phone]

## 2019-04-21 NOTE — Telephone Encounter (Signed)
Patient advised and appointment made for 04/23/2019

## 2019-04-22 ENCOUNTER — Ambulatory Visit: Payer: Medicare Other

## 2019-04-23 ENCOUNTER — Ambulatory Visit (INDEPENDENT_AMBULATORY_CARE_PROVIDER_SITE_OTHER): Payer: Medicare Other | Admitting: Family Medicine

## 2019-04-23 ENCOUNTER — Ambulatory Visit: Payer: Medicare Other

## 2019-04-23 ENCOUNTER — Other Ambulatory Visit: Payer: Self-pay

## 2019-04-23 ENCOUNTER — Ambulatory Visit (INDEPENDENT_AMBULATORY_CARE_PROVIDER_SITE_OTHER)
Admission: RE | Admit: 2019-04-23 | Discharge: 2019-04-23 | Disposition: A | Discharge status: Home / Self Care | Payer: Medicare Other | Source: Ambulatory Visit | Attending: Family Medicine | Admitting: Family Medicine

## 2019-04-23 VITALS — BP 138/66 | HR 66 | Temp 98.3°F | Resp 24 | Ht 62.0 in | Wt 132.4 lb

## 2019-04-23 DIAGNOSIS — R059 Cough, unspecified: Secondary | ICD-10-CM

## 2019-04-23 DIAGNOSIS — R05 Cough: Secondary | ICD-10-CM | POA: Diagnosis not present

## 2019-04-23 MED ORDER — LOSARTAN POTASSIUM-HCTZ 100-12.5 MG PO TABS: 1.0000 | ORAL_TABLET | Freq: Every day | ORAL | 3 refills | Status: DC

## 2019-04-23 MED ORDER — QNASL 80 MCG/ACT NA AERS: 2.0000 | INHALATION_SPRAY | Freq: Every day | NASAL | Status: DC

## 2019-04-23 MED ORDER — BUDESONIDE-FORMOTEROL FUMARATE 160-4.5 MCG/ACT IN AERO: 2.0000 | INHALATION_SPRAY | Freq: Two times a day (BID) | RESPIRATORY_TRACT | 3 refills | Status: DC

## 2019-04-23 MED ORDER — MONTELUKAST SODIUM 10 MG PO TABS: 10.0000 mg | ORAL_TABLET | Freq: Every day | ORAL | Status: DC

## 2019-04-23 MED ORDER — PANTOPRAZOLE SODIUM 40 MG PO TBEC: 40.0000 mg | DELAYED_RELEASE_TABLET | Freq: Two times a day (BID) | ORAL | 3 refills | Status: DC

## 2019-04-23 MED ORDER — OLOPATADINE HCL 0.2 % OP SOLN: 1.0000 [drp] | Freq: Every day | OPHTHALMIC | Status: AC | PRN

## 2019-04-23 MED ORDER — ALBUTEROL SULFATE HFA 108 (90 BASE) MCG/ACT IN AERS: 2.0000 | INHALATION_SPRAY | RESPIRATORY_TRACT | Status: DC | PRN

## 2019-04-23 NOTE — Progress Notes (Signed)
History of cough and sore throat.  Magic mouthwash helped her throat.  She has burning in the throat after inhaler use, even with rinsing after use.  She had tried to stop asmanex but still doesn't feel well.  She is still been using Symbicort.  She has a hoarse voice and throat clearing at the office visit.    She had prev white lesions on the L tonsil, resolved in the meantime.  No fevers.  No wheeze but her chest can feel tight.    She is getting some heartburn.  She has seen Dr. Jaclynn Major but not recently.  She had seen pulmonary in the distant past.  Meds, vitals, and allergies reviewed.   ROS: Per HPI unless specifically indicated in ROS section   nad ncat OP wnl, nasal exam wnl No exudates noted Neck supple, no LA rrr Ctab, no wheeze.  No decrease in breath sounds Voice is slightly hoarse but no stridor Extremities without edema.  Skin well perfused.

## 2019-04-23 NOTE — Patient Instructions (Signed)
Try going up to 2 of the pantoprazole tabs a day.  Go to the lab on the way out.  We'll contact you with your xray report. If this isn't helping then let me know.  We may need to get you to see Dr. Wilburn Cornelia if not better.  Take care.  Glad to see you.

## 2019-04-24 ENCOUNTER — Other Ambulatory Visit: Payer: Self-pay | Admitting: Family Medicine

## 2019-04-24 DIAGNOSIS — R059 Cough, unspecified: Secondary | ICD-10-CM

## 2019-04-24 DIAGNOSIS — R05 Cough: Secondary | ICD-10-CM

## 2019-04-26 NOTE — Assessment & Plan Note (Signed)
Discussed with patient about differential diagnosis.  GERD could contribute.  In the meantime would try going up to 2 pantoprazole tabs a day.  Check chest x-ray today.  See notes on imaging.  She has chronic changes noted and I think it makes sense to see pulmonary.  We may need to get her to see ENT in the future but I would start with pulmonary first.  She agrees with plan.  At this point still okay for outpatient follow-up. >25 minutes spent in face to face time with patient, >50% spent in counselling or coordination of care.

## 2019-04-30 ENCOUNTER — Encounter: Payer: Self-pay | Admitting: Internal Medicine

## 2019-04-30 ENCOUNTER — Other Ambulatory Visit: Payer: Self-pay

## 2019-04-30 ENCOUNTER — Ambulatory Visit (INDEPENDENT_AMBULATORY_CARE_PROVIDER_SITE_OTHER): Payer: Medicare Other | Admitting: Internal Medicine

## 2019-04-30 VITALS — BP 148/70 | HR 75 | Temp 97.5°F | Ht 62.0 in | Wt 133.4 lb

## 2019-04-30 DIAGNOSIS — J4541 Moderate persistent asthma with (acute) exacerbation: Secondary | ICD-10-CM

## 2019-04-30 DIAGNOSIS — R05 Cough: Secondary | ICD-10-CM

## 2019-04-30 DIAGNOSIS — M898X1 Other specified disorders of bone, shoulder: Secondary | ICD-10-CM

## 2019-04-30 DIAGNOSIS — J4540 Moderate persistent asthma, uncomplicated: Secondary | ICD-10-CM

## 2019-04-30 DIAGNOSIS — K219 Gastro-esophageal reflux disease without esophagitis: Secondary | ICD-10-CM | POA: Diagnosis not present

## 2019-04-30 DIAGNOSIS — R059 Cough, unspecified: Secondary | ICD-10-CM

## 2019-04-30 MED ORDER — BREO ELLIPTA 100-25 MCG/INH IN AEPB: 1.0000 | INHALATION_SPRAY | Freq: Every day | RESPIRATORY_TRACT | 0 refills | Status: DC

## 2019-04-30 NOTE — Assessment & Plan Note (Signed)
Not clear if cough and hoarseness reflect asthma, reflux, thrush (now treated) Plan- try changing Symbicort with Breo 100 sample, then return to Symbicort with spacer for comparison. PFT

## 2019-04-30 NOTE — Assessment & Plan Note (Signed)
Atypical recent chest wall pains and intractable cough Plan- CT no contrast

## 2019-04-30 NOTE — Progress Notes (Signed)
Patient ID: Samantha [name], female    DOB: May 04, 1946, 73 y.o.   MRN: [id number]  HPI female never smoker followed for allergic rhinitis, asthma, complicated by GER/ LPR, Raynaud's, HBP, IBS, GlaucomaD Barium swallow 01/09/2016-normal  --------------------------------------------------------------------------------------  10/03/2016- 73 year old female never smoker followed for allergic rhinitis, asthma, complicated by GERD Allergy vaccine at this office [place] 09/09/2016. Vaccine Clinic closed FOLLOWS FOR: Pt will go Asthma and Allergy clinic(Dr Kozlow). Pt completed her allergy vaccine; last injection was right before Christmas.  Upper respiratory infection a Christmas and then last week had a GI virus. Main risk is exposure to grandchildren with colds. Using albuterol rescue inhaler only occasionally. Dry cough this week but currently no fever or acute concern.  04/30/2019- 73 year old female never smoker followed for allergic rhinitis, asthma, complicated by GERD/ LPR, Raynaud's, HBP, IBS, Glaucoma -----last seen 10/03/2016; referred by by Dr. Damita Dunnings (PCP) for cough ED [facility] 03/10/2019- SOB, Cough, R lateral chest wall pain, Covid swab neg. Sed rate and CRP low/ Nl 03/19/2019. -----last seen 10/03/2016; referred by by Dr. Damita Dunnings (PCP) for cough; pt states she has had a cough w/ varing thin an thick clear mucus all summer, has gotten CXR for it 04/23/2019 Albuterol hfa, azelastine nasal, Qnasl, Symbicort 160, singulair,  Protonix, Carafate,  Says Dr Neldon Mc did skin test "not much allergy". Had hoarseness and cough all summer. Uses spacer with symbicort, but says that symbicort and azelastine and Qnasl sprays "burn".  HOB up w adjustable bed, but stomach rumbles and lower esphagus "burn" as she lies down. Pending GI visit next week.  She doesn't suspect infection in sinuses or chest now.  Recent use of Rx mouth wash has helped sore throat Notes some soreness just below R clavicle- recent, no  trauma. CXR 04/23/2019  IMPRESSION: Chronic interstitial and bronchitic changes in the lung bases similar to prior. No acute cardiopulmonary abnormality.   Review of Systems-see HPI   + = positive Constitutional:   No weight loss, night sweats,  Fevers, chills, fatigue, lassitude. HEENT:   No headaches,  Difficulty swallowing,  Tooth/dental problems,  Sore throat,                No sneezing, itching, or +ear ache,   +nasal congestion, post nasal drip, CV:  + chest pain, orthopnea, PND, swelling in lower extremities, anasarca, dizziness, palpitations GI  No heartburn, indigestion, abdominal pain, nausea, vomiting,  Resp: No shortness of breath with exertion or at rest.  No excess mucus, no productive cough,                                  +-non-productive cough,  No coughing up of blood.  No change in color of mucus.  +infrequent wheezing.   Skin: Clear GU:  MS:  No joint pain or swelling.   Psych:  No change in mood or affect. No depression or anxiety.  No memory loss.   Objective:   Physical Exam General- Alert, Oriented, Affect-appropriate, Distress- none acute; pleasant  Skin- Clear Lymphadenopathy- none Head- atraumatic            Eyes- Gross vision intact, PERRLA, conjunctivae clear secretions,            Ears- Normal hearing            Nose- clear, no-Septal dev, mucus, polyps, erosion, perforation .  Throat- Malampatti III.   TMs not  retracted , mucosa not red , drainage- none,                     tonsils- atrophic;   Neck- flexible , trachea midline, no stridor , thyroid nl, carotid no bruit Chest - symmetrical excursion , unlabored           Heart/CV- RRR , no murmur , no gallop  , no rub, nl s1 s2                           - JVD- none , edema- none, stasis changes- none, varices- none           Lung-  wheeze or rhonchi,-none cough-none , dullness-none, rub- none           Chest wall- + minimal tenderness w/o bruise or palpable abnl about 3 cm  distal to R mid clavcle. Abd-  Br/ Gen/ Rectal- Not done, not indicated Extrem- cyanosis- none, clubbing, none, atrophy- none, strength- nl.  Neuro- grossly intact to observation

## 2019-04-30 NOTE — Assessment & Plan Note (Signed)
She is to keep pending f/u w GI

## 2019-04-30 NOTE — Patient Instructions (Signed)
Sample Breo 100     Inhale 1 puff, then rinse mouth, once daily    Try this for a week instead of Symbicort. See if you like Breo any better.  Order- PFT     Dx asthma moderate persistent  Order- Schedule CT chest no contrast  Dx chronic cough

## 2019-05-06 ENCOUNTER — Encounter (INDEPENDENT_AMBULATORY_CARE_PROVIDER_SITE_OTHER): Admitting: Neurology

## 2019-05-06 ENCOUNTER — Other Ambulatory Visit: Payer: Self-pay

## 2019-05-06 ENCOUNTER — Ambulatory Visit (INDEPENDENT_AMBULATORY_CARE_PROVIDER_SITE_OTHER): Payer: Medicare Other | Admitting: Neurology

## 2019-05-06 DIAGNOSIS — R519 Headache, unspecified: Secondary | ICD-10-CM

## 2019-05-06 DIAGNOSIS — R202 Paresthesia of skin: Secondary | ICD-10-CM

## 2019-05-06 DIAGNOSIS — Z0289 Encounter for other administrative examinations: Secondary | ICD-10-CM

## 2019-05-06 NOTE — Procedures (Signed)
Full Name: Samantha [name] Gender: Female MRN #: [id number] Date of Birth: October 20, 1945    Visit Date: 05/06/2019 10:32 Age: 73 Years 11 Months Old Examining Physician: Marcial Pacas, MD  Referring Physician: Marcial Pacas, MD History:   73 year old female, presented with right hand paresthesia involving the first 4 fingers.  Summary of the tests:  Nerve conduction study: Right sural, superficial peroneal sensory responses were normal.  Right peroneal to EDB, tibial motor responses were normal.  Right ulnar sensory and motor responses were normal.  Right median sensory response showed moderately prolonged peak latency, with mildly decreased snap amplitude.  Right median mixed response was 0.7 ms prolonged in comparison to the ipsilateral ulnar mixed response.  Left median, ulnar sensory and motor responses were normal.  Electromyography: Selected needle examination of right upper extremity muscles and right cervical paraspinal muscles were normal.  Conclusion: This is a mild abnormal study.  There is electrodiagnostic evidence of right median neuropathy across the wrist, consistent with moderate right carpal tunnel syndrome.    ------------------------------- Marcial Pacas, M.D. PhD  [facility] [place], [place] [address] Tel: [phone] Fax: [phone]        [facility]    Nerve / Sites Muscle Latency Ref. Amplitude Ref. Rel Amp Segments Distance Velocity Ref. Area    ms ms mV mV %  cm m/s m/s mVms  L Median - APB     Wrist APB 4.3 ?4.4 9.2 ?4.0 100 Wrist - APB 7   36.1     Upper arm APB 8.3  8.7  95.3 Upper arm - Wrist 22 55 ?49 34.1  R Median - APB     Wrist APB 4.5 ?4.4 9.0 ?4.0 100 Wrist - APB 7   35.6     Upper arm APB 8.5  8.5  94.6 Upper arm - Wrist 23 57 ?49 34.7  L Ulnar - ADM     Wrist ADM 3.1 ?3.3 13.4 ?6.0 100 Wrist - ADM 7   35.6     B.Elbow ADM 7.2  11.4  84.7 B.Elbow - Wrist 20 49 ?49 34.1     A.Elbow ADM 9.2  10.4  91.7 A.Elbow -  B.Elbow 10 49 ?49 32.8         A.Elbow - Wrist      R Ulnar - ADM     Wrist ADM 2.9 ?3.3 11.7 ?6.0 100 Wrist - ADM 7   35.4     B.Elbow ADM 7.0  10.6  90.1 B.Elbow - Wrist 21 52 ?49 33.9     A.Elbow ADM 9.0  10.3  98 A.Elbow - B.Elbow 10 51 ?49 34.3         A.Elbow - Wrist      R Peroneal - EDB     Ankle EDB 4.2 ?6.5 5.6 ?2.0 100 Ankle - EDB 9   16.4     Fib head EDB 9.8  4.9  87.3 Fib head - Ankle 28 50 ?44 15.2     Pop fossa EDB 11.9  4.7  96.3 Pop fossa - Fib head 10 49 ?44 15.1         Pop fossa - Ankle      R Tibial - AH     Ankle AH 4.3 ?5.8 10.6 ?4.0 100 Ankle - AH 9   21.0     Pop fossa AH 12.3  9.2  86.9 Pop fossa - Ankle 34 42 ?41 21.2  [place]    Nerve / Sites Rec. Site Peak Lat Ref.  Amp Ref. Segments Distance Peak Diff Ref.    ms ms V V  cm ms ms  R Sural - Ankle (Calf)     Calf Ankle 3.2 ?4.4 13 ?6 Calf - Ankle 14    R Superficial peroneal - Ankle     Lat leg Ankle 4.1 ?4.4 10 ?6 Lat leg - Ankle 14    L Median, Ulnar - Transcarpal comparison     Median Palm Wrist 2.4 ?2.2 48 ?35 Median Palm - Wrist 8       Ulnar Palm Wrist 2.2 ?2.2 19 ?12 Ulnar Palm - Wrist 8          Median Palm - Ulnar Palm  0.2 ?0.4  R Median, Ulnar - Transcarpal comparison     Median Palm Wrist 2.9 ?2.2 38 ?35 Median Palm - Wrist 8       Ulnar Palm Wrist 2.2 ?2.2 25 ?12 Ulnar Palm - Wrist 8          Median Palm - Ulnar Palm  0.7 ?0.4  L Median - Orthodromic (Dig II, Mid palm)     Dig II Wrist 3.4 ?3.4 16 ?10 Dig II - Wrist 13    R Median - Orthodromic (Dig II, Mid palm)     Dig II Wrist 3.9 ?3.4 7 ?10 Dig II - Wrist 13    L Ulnar - Orthodromic, (Dig V, Mid palm)     Dig V Wrist 3.1 ?3.1 11 ?5 Dig V - Wrist 11    R Ulnar - Orthodromic, (Dig V, Mid palm)     Dig V Wrist 3.0 ?3.1 10 ?5 Dig V - Wrist 26                       F  Wave    Nerve F Lat Ref.   ms ms  L Ulnar - ADM 29.2 ?32.0  R Ulnar - ADM 28.8 ?32.0  R Tibial - AH 48.5 ?56.0           EMG       EMG Summary  Table    Spontaneous MUAP Recruitment  Muscle IA Fib PSW Fasc Other Amp Dur. Poly Pattern  R. First dorsal interosseous Normal None None None _______ Normal Normal Normal Normal  R. Pronator teres Normal None None None _______ Normal Normal Normal Normal  R. Biceps brachii Normal None None None _______ Normal Normal Normal Normal  R. Deltoid Normal None None None _______ Normal Normal Normal Normal  R. Triceps brachii Normal None None None _______ Normal Normal Normal Normal  R. Cervical paraspinals Normal None None None _______ Normal Normal Normal Normal

## 2019-05-07 ENCOUNTER — Ambulatory Visit (INDEPENDENT_AMBULATORY_CARE_PROVIDER_SITE_OTHER): Payer: Medicare Other

## 2019-05-07 DIAGNOSIS — K219 Gastro-esophageal reflux disease without esophagitis: Secondary | ICD-10-CM | POA: Diagnosis not present

## 2019-05-07 DIAGNOSIS — R1011 Right upper quadrant pain: Secondary | ICD-10-CM | POA: Diagnosis not present

## 2019-05-07 DIAGNOSIS — Z23 Encounter for immunization: Secondary | ICD-10-CM | POA: Diagnosis not present

## 2019-05-15 ENCOUNTER — Telehealth: Payer: Self-pay | Admitting: *Deleted

## 2019-05-15 NOTE — Telephone Encounter (Signed)
Received a referral for the patient, but she is a returning patient. Called and left the patient a message to call the office back

## 2019-05-15 NOTE — Telephone Encounter (Signed)
Patient called back and was schedule an appt

## 2019-05-20 ENCOUNTER — Telehealth: Payer: Self-pay | Admitting: Internal Medicine

## 2019-05-20 ENCOUNTER — Other Ambulatory Visit: Payer: Self-pay

## 2019-05-20 ENCOUNTER — Ambulatory Visit (INDEPENDENT_AMBULATORY_CARE_PROVIDER_SITE_OTHER)
Admission: RE | Admit: 2019-05-20 | Discharge: 2019-05-20 | Disposition: A | Discharge status: Home / Self Care | Payer: Medicare Other | Source: Ambulatory Visit | Attending: Internal Medicine | Admitting: Internal Medicine

## 2019-05-20 DIAGNOSIS — J479 Bronchiectasis, uncomplicated: Secondary | ICD-10-CM | POA: Diagnosis not present

## 2019-05-20 DIAGNOSIS — R059 Cough, unspecified: Secondary | ICD-10-CM

## 2019-05-20 DIAGNOSIS — R05 Cough: Secondary | ICD-10-CM

## 2019-05-20 NOTE — Telephone Encounter (Signed)
She can continue current meds for now. When is her PFT scheduled for? That will help.

## 2019-05-20 NOTE — Telephone Encounter (Signed)
Advised pt of results. Pt understood. She would like to know what she can do about this? Does she need to take any medications for the cough? She also states she is wheezing some but used her inhaler and it went away. Please advise.    Notes recorded by Deneise Lever, MD on 05/20/2019 at 2:15 PM EDT  CT chest- there is mild bronchiectasis, a form of chronic bronchitis. This may be related to cough, although no definite active inflammation is now seen. Heart is mildly enlarged.

## 2019-05-21 ENCOUNTER — Ambulatory Visit: Payer: Medicare Other

## 2019-05-21 NOTE — Telephone Encounter (Signed)
[name] do we know when she will be able to come in for PFT?

## 2019-05-22 NOTE — Telephone Encounter (Signed)
Scheduled pft on 9/25-pr

## 2019-05-27 ENCOUNTER — Other Ambulatory Visit: Payer: Self-pay | Admitting: Internal Medicine

## 2019-06-02 ENCOUNTER — Other Ambulatory Visit: Payer: Self-pay

## 2019-06-02 ENCOUNTER — Other Ambulatory Visit
Admission: RE | Admit: 2019-06-02 | Discharge: 2019-06-02 | Disposition: A | Discharge status: Home / Self Care | Payer: Medicare Other | Source: Ambulatory Visit | Attending: Internal Medicine | Admitting: Internal Medicine

## 2019-06-02 DIAGNOSIS — Z01812 Encounter for preprocedural laboratory examination: Secondary | ICD-10-CM | POA: Diagnosis not present

## 2019-06-02 DIAGNOSIS — Z20828 Contact with and (suspected) exposure to other viral communicable diseases: Secondary | ICD-10-CM | POA: Diagnosis not present

## 2019-06-03 LAB — SARS CORONAVIRUS 2 (TAT 6-24 HRS): SARS Coronavirus 2: NEGATIVE

## 2019-06-05 ENCOUNTER — Ambulatory Visit (INDEPENDENT_AMBULATORY_CARE_PROVIDER_SITE_OTHER): Payer: Medicare Other | Admitting: Internal Medicine

## 2019-06-05 ENCOUNTER — Other Ambulatory Visit: Payer: Self-pay

## 2019-06-05 DIAGNOSIS — J4540 Moderate persistent asthma, uncomplicated: Secondary | ICD-10-CM

## 2019-06-05 LAB — PULMONARY FUNCTION TEST
DL/VA % pred: 149 %
DL/VA: 6.26 ml/min/mmHg/L
DLCO unc % pred: 118 %
DLCO unc: 21.25 ml/min/mmHg
FEF 25-75 Post: 2.33 L/sec
FEF 25-75 Pre: 2.23 L/sec
FEF2575-%Change-Post: 4 %
FEF2575-%Pred-Post: 162 %
FEF2575-%Pred-Pre: 155 %
FEV1-%Change-Post: 1 %
FEV1-%Pred-Post: 121 %
FEV1-%Pred-Pre: 119 %
FEV1-Post: 1.92 L
FEV1-Pre: 1.89 L
FEV1FVC-%Change-Post: 4 %
FEV1FVC-%Pred-Pre: 106 %
FEV6-%Change-Post: -1 %
FEV6-%Pred-Post: 115 %
FEV6-%Pred-Pre: 117 %
FEV6-Post: 2.25 L
FEV6-Pre: 2.29 L
FEV6FVC-%Pred-Post: 104 %
FEV6FVC-%Pred-Pre: 104 %
FVC-%Change-Post: -2 %
FVC-%Pred-Post: 109 %
FVC-%Pred-Pre: 112 %
FVC-Post: 2.25 L
FVC-Pre: 2.31 L
Post FEV1/FVC ratio: 85 %
Post FEV6/FVC ratio: 100 %
Pre FEV1/FVC ratio: 82 %
Pre FEV6/FVC Ratio: 100 %
RV % pred: 50 %
RV: 1.08 L
TLC % pred: 69 %
TLC: 3.32 L

## 2019-06-05 NOTE — Progress Notes (Signed)
Full PFT performed today. °

## 2019-06-10 ENCOUNTER — Telehealth: Payer: Self-pay | Admitting: *Deleted

## 2019-06-10 ENCOUNTER — Other Ambulatory Visit: Payer: Self-pay | Admitting: Gastroenterology

## 2019-06-10 ENCOUNTER — Telehealth: Payer: Self-pay | Admitting: Internal Medicine

## 2019-06-10 ENCOUNTER — Ambulatory Visit: Payer: Medicare Other

## 2019-06-10 DIAGNOSIS — R1030 Lower abdominal pain, unspecified: Secondary | ICD-10-CM

## 2019-06-10 DIAGNOSIS — R102 Pelvic and perineal pain: Secondary | ICD-10-CM

## 2019-06-10 NOTE — Telephone Encounter (Signed)
Called spoke with patient, she states she was told from a CT scan she had bronchitis.  I look at CT scan patient has bronchietasis in both lungs.  Patient had PFT 06/05/19.  Patient is having "laryngitis" , increased coughing minimal sputum production, doesn't know color. No fever temp currently is 97.7. Patient wants to know if she needs anything called in for her symptoms.   Also patient wants to know if she should go out to her oncologist appointment tomorrow.  Dr. Annamaria Boots please advise  Last OV 04/30/19 : "Instructions    Return in about 4 months (around 08/30/2019). Sample Breo 100     Inhale 1 puff, then rinse mouth, once daily    Try this for a week instead of Symbicort. See if you like Breo any better.  Order- PFT     Dx asthma moderate persistent  Order- Schedule CT chest no contrast  Dx chronic cough     " Allergies  Allergen Reactions  . Cefuroxime Axetil     REACTION: pt not sure of reaction- she can tolerate amoxil  . Doxycycline Itching  . Latex Itching  . Lovastatin Other (See Comments)    Muscle aches   . Sulfonamide Derivatives     REACTION: pt not sure of reaction    Current Outpatient Medications on File Prior to Visit  Medication Sig Dispense Refill  . acetaminophen (TYLENOL) 500 MG tablet Take 1,000 mg by mouth every 6 (six) hours as needed for mild pain or headache.    . albuterol (VENTOLIN HFA) 108 (90 Base) MCG/ACT inhaler Inhale 2 puffs into the lungs every 4 (four) hours as needed for wheezing or shortness of breath.    . Azelastine HCl 0.15 % SOLN Place 2 sprays into both nostrils 2 (two) times daily as needed (runny nose). 90 mL 2  . Beclomethasone Dipropionate (QNASL) 80 MCG/ACT AERS Place 2 sprays into both nostrils daily. Two sprays into both nostrils daily    . bifidobacterium infantis (ALIGN) capsule Take 1 capsule by mouth daily.      . budesonide-formoterol (SYMBICORT) 160-4.5 MCG/ACT inhaler Inhale 2 puffs into the lungs 2 (two) times daily.  3 Inhaler 3  . cetirizine (ZYRTEC) 10 MG tablet Take 10 mg by mouth at bedtime.     . Coenzyme Q-10 60 MG CAPS Take 60 capsules by mouth daily.     . Diphenhyd-Hydrocort-Nystatin (FIRST-DUKES MOUTHWASH) SUSP Use as directed 5 mLs in the mouth or throat 4 (four) times daily as needed. 100 mL 1  . fluticasone furoate-vilanterol (BREO ELLIPTA) 100-25 MCG/INH AEPB Inhale 1 puff into the lungs daily. 1 each 0  . losartan-hydrochlorothiazide (HYZAAR) 100-12.5 MG tablet Take 1 tablet by mouth daily. 90 tablet 3  . Magnesium Malate POWD (1250mg  tabs) 1 tablet by mouth two times a day    . Manganese 50 MG TABS Take 50 mg by mouth at bedtime.     . Misc Natural Products (TART CHERRY ADVANCED PO) Take 2 tablets by mouth daily.     . montelukast (SINGULAIR) 10 MG tablet Take 1 tablet (10 mg total) by mouth at bedtime.    . Olopatadine HCl (PATADAY) 0.2 % SOLN Place 1 drop into both eyes daily as needed (itchy eyes).    . Omega-3 Fatty Acids (OMEGA 3 PO) Take 1 capsule by mouth daily.     . pantoprazole (PROTONIX) 40 MG tablet Take 1 tablet (40 mg total) by mouth 2 (two) times daily. 180 tablet 3  .  Polyethyl Glycol-Propyl Glycol (SYSTANE) 0.4-0.3 % GEL ophthalmic gel Place 1 application into both eyes every 6 (six) hours as needed (dry eyes).    . sucralfate (CARAFATE) 1 g tablet Take 1 tablet (1 g total) by mouth 2 (two) times daily.    Marland Kitchen thiamine 100 MG tablet Take 100 mg by mouth daily with supper.     . TURMERIC PO Take 1 tablet by mouth 2 (two) times a day.     Marland Kitchen VITAMIN D PO Take 1 tablet by mouth daily.     No current facility-administered medications on file prior to visit.

## 2019-06-10 NOTE — Telephone Encounter (Signed)
Called spoke with patient. She states that she appreciated Dr. Janee Morn recommendations and she will monitor her symptoms if they get worse she will let us know.  Offered patient a sit down visit with CY to go over Bronchiectasis and patient declined at this time. Nothing further needed at this time.

## 2019-06-10 NOTE — Telephone Encounter (Signed)
Patient called and asked "I have this cough and woke up this morning with laryngitis. I was diagnosis with bronchitis on 9/9. Is it ok to come Friday. I have a call into my pulmonologist." Patient was asked and answered no to having a sore thoart, chills, body aches and headache. Per Melissa APP explained to the patient if the pulmonologist gives the "green light" she can come. She will call the office back once she hears from him.

## 2019-06-10 NOTE — Telephone Encounter (Signed)
There is nothing that needs to be done for now. Mild hoarseness and cough without fever or discolored sputum may be viral, allergy or just from weather change. Her Covid test was negative last week. Ok to go to her Oncology appointment. We will discuss her xray studies when we see her next. If something changes to concern her between now and then please feel free to call.

## 2019-06-12 ENCOUNTER — Encounter: Payer: Self-pay | Admitting: Gynecologic Oncology

## 2019-06-12 ENCOUNTER — Other Ambulatory Visit: Payer: Self-pay

## 2019-06-12 ENCOUNTER — Inpatient Hospital Stay: Payer: Medicare Other | Attending: Gynecologic Oncology | Admitting: Gynecologic Oncology

## 2019-06-12 VITALS — BP 158/57 | HR 67 | Temp 98.2°F | Resp 16 | Ht 62.0 in | Wt 134.4 lb

## 2019-06-12 DIAGNOSIS — I10 Essential (primary) hypertension: Secondary | ICD-10-CM | POA: Insufficient documentation

## 2019-06-12 DIAGNOSIS — R739 Hyperglycemia, unspecified: Secondary | ICD-10-CM | POA: Diagnosis not present

## 2019-06-12 DIAGNOSIS — M1990 Unspecified osteoarthritis, unspecified site: Secondary | ICD-10-CM | POA: Diagnosis not present

## 2019-06-12 DIAGNOSIS — N83209 Unspecified ovarian cyst, unspecified side: Secondary | ICD-10-CM

## 2019-06-12 DIAGNOSIS — J45909 Unspecified asthma, uncomplicated: Secondary | ICD-10-CM | POA: Diagnosis not present

## 2019-06-12 DIAGNOSIS — K589 Irritable bowel syndrome without diarrhea: Secondary | ICD-10-CM | POA: Insufficient documentation

## 2019-06-12 DIAGNOSIS — Z803 Family history of malignant neoplasm of breast: Secondary | ICD-10-CM | POA: Insufficient documentation

## 2019-06-12 DIAGNOSIS — M797 Fibromyalgia: Secondary | ICD-10-CM | POA: Diagnosis not present

## 2019-06-12 DIAGNOSIS — E7800 Pure hypercholesterolemia, unspecified: Secondary | ICD-10-CM | POA: Diagnosis not present

## 2019-06-12 DIAGNOSIS — K219 Gastro-esophageal reflux disease without esophagitis: Secondary | ICD-10-CM | POA: Diagnosis not present

## 2019-06-12 DIAGNOSIS — N83201 Unspecified ovarian cyst, right side: Secondary | ICD-10-CM

## 2019-06-12 NOTE — Patient Instructions (Signed)
Dr Denman George does not suspect that this ovarian cyst is cancerous. However, if your pain returns, please call Dr Serita Grit office at [phone] to discuss if a surgery may help resolve the pain.

## 2019-06-12 NOTE — Progress Notes (Signed)
Follow-up Note: Gyn-Onc  Patient was resent by Dr. Radene Knee for the evaluation of Samantha [name] 73 y.o. female  CC:  Chief Complaint  Patient presents with  . Ovarian Cyst    follow-up    Assessment/Plan:  [name]  is a 73 y.o.  year old with a slightly complex right ovarian cyst and normal CA 125 (that has been overall stable for approximately 5 years).   I again discussed with Lashandra that I had a low suspicion of malignancy given the description of the ovarian cysts on imaging, the normal CA-125 value, and the fact that she has had a cyst present since 2015 which speaks to a natural history of stability. It has continued to grow only slightly (<1cm) in the past year. Her CA 125 remains within normal limits.  I do not seriously suspect that the source of her right lower quadrant pain is the cyst as her right lower quadrant pain is relatively new, but this cyst has been present and fairly stable for 5 years.  I will follow-up the results of her impending CT abdomen and pelvis at [facility] radiology.  If this fails to show an alternative explanation for the right lower quadrant pain, it would be not unreasonable to offer her a minimally invasive BSO procedure if her pain returns.  She is in agreement with this.  I offered her proceeding with surgery now as she is a fairly low risk surgical candidate, however she is not particularly inclined to undergo surgery and recovery at this time unless it is likely to improve her symptoms which I cannot guarantee.  Additionally she has a new diagnosis of bronchiectasis which puts her at slightly increased risk of postoperative pulmonary complications.  She does not require ongoing surveillance with ultrasounds as this cyst is unlikely to resolve. However, if she has development of new symptoms, a repeat US is reasonable.  HPI: Samantha [name] is a 73 year old G3P3 who is seen in consultation at the request of Dr Radene Knee for right complex  ovarian cysts. The patient has a known history of bilateral ovarian cysts and had a diagnostic laparoscopy approximately 10 years ago when biopsy was performed of her ovaries and these were benign.   She last had an [place] in 2015 which per patient showed ovarian cysts.  She began experiencing mild pelvic discomfort and abdominal bloating in the spring of 2017. She feels her discomfort is worse with bowel movements and eating.  She has a known history of diverticulosis.  She underwent a transvaginal US on 02/20/16 with Dr Radene Knee which showed a uterus measuring 5.6 x 2.5 x 4.2 cm with a 2 x 1.5 cm posterior subserosal fibroid. The left ovary was within normal limits. The right ovary contained multiple cystic areas, the largest with septations and possibly a papillary projection, but no increased blood flow seen. The largest cyst measured 2.7 x 2.2 cm there were 2 other simple appearing cyst within the right ovary which measured 1.8 cm and 1.1 cm. No adnexal masses or free fluid was seen. A CA-125 drawn on 02/20/2016 was normal at 18 units per milliliter.  The patient is fairly healthy. Her BMI is 25kg.m2. She has asthma, fibromyalgia, irritable bowel syndrome, renal its disease, as of 1 prior abdominal surgery which was a diagnostic laparoscopy possibly in 2007. She has no first-degree family relatives to have a history of malignancy and has remote family history of breast cancer but not in a constellation concerning for  hereditary breast ovarian cancer syndrome.  Repeat US on 06/03/17 showed slight increase in the cyst to 3.5x2.2x2.8cm. It contained a 68mm papillary projection with blood flow (which had been present previously). The CA 125 was rechecked and remained low at 12.  Interval Hx: She was diagnosed with bronchiectasis in 2019.   The patient developed new symptoms of pelvic pain in the summer 2020.  This included right lower quadrant pain.  She then underwent a transvaginal ultrasound on May 14, 2019 with Dr. Radene Knee which revealed a uterus measuring 6.5 x 2.6 x 4 cm with an endometrial thickness of 1.7 mm.  The right ovary contained a 3.2 x 2.2 cm cyst with a fall and 6 mm papillary projections seen within and a 2.1 x 1.3 simple appearing cyst seen.  There was no blood flow within the cyst.  There was blood within the ovary.  The left adnexa was grossly normal and very small in dimensions.  No free fluid was seen.  Ca1 25 was drawn on May 14, 2019 and was normal at 21.5. Repeat US on   Current Meds:  Outpatient Encounter Medications as of 06/12/2019  Medication Sig  . acetaminophen (TYLENOL) 500 MG tablet Take 1,000 mg by mouth every 6 (six) hours as needed for mild pain or headache.  . albuterol (VENTOLIN HFA) 108 (90 Base) MCG/ACT inhaler Inhale 2 puffs into the lungs every 4 (four) hours as needed for wheezing or shortness of breath.  . Azelastine HCl 0.15 % SOLN Place 2 sprays into both nostrils 2 (two) times daily as needed (runny nose).  . Beclomethasone Dipropionate (QNASL) 80 MCG/ACT AERS Place 2 sprays into both nostrils daily. Two sprays into both nostrils daily  . bifidobacterium infantis (ALIGN) capsule Take 1 capsule by mouth daily.    . budesonide-formoterol (SYMBICORT) 160-4.5 MCG/ACT inhaler Inhale 2 puffs into the lungs 2 (two) times daily.  . cetirizine (ZYRTEC) 10 MG tablet Take 10 mg by mouth at bedtime.   . Coenzyme Q-10 60 MG CAPS Take 60 capsules by mouth daily.   . fluticasone furoate-vilanterol (BREO ELLIPTA) 100-25 MCG/INH AEPB Inhale 1 puff into the lungs daily.  Marland Kitchen losartan-hydrochlorothiazide (HYZAAR) 100-12.5 MG tablet Take 1 tablet by mouth daily.  . Magnesium Malate POWD (1250mg  tabs) 1 tablet by mouth two times a day  . Manganese 50 MG TABS Take 50 mg by mouth at bedtime.   . Misc Natural Products (TART CHERRY ADVANCED PO) Take 2 tablets by mouth daily.   . montelukast (SINGULAIR) 10 MG tablet Take 1 tablet (10 mg total) by mouth at bedtime.  .  Olopatadine HCl (PATADAY) 0.2 % SOLN Place 1 drop into both eyes daily as needed (itchy eyes).  . Omega-3 Fatty Acids (OMEGA 3 PO) Take 1 capsule by mouth daily.   . pantoprazole (PROTONIX) 40 MG tablet Take 1 tablet (40 mg total) by mouth 2 (two) times daily.  Vladimir Faster Glycol-Propyl Glycol (SYSTANE) 0.4-0.3 % GEL ophthalmic gel Place 1 application into both eyes every 6 (six) hours as needed (dry eyes).  . sucralfate (CARAFATE) 1 g tablet Take 1 tablet (1 g total) by mouth 2 (two) times daily.  Marland Kitchen thiamine 100 MG tablet Take 100 mg by mouth daily with supper.   . TURMERIC PO Take 1 tablet by mouth 2 (two) times a day.   Marland Kitchen VITAMIN D PO Take 1 tablet by mouth daily.  . [DISCONTINUED] Diphenhyd-Hydrocort-Nystatin (FIRST-DUKES MOUTHWASH) SUSP Use as directed 5 mLs in the mouth or  throat 4 (four) times daily as needed.   No facility-administered encounter medications on file as of 06/12/2019.     Allergy:  Allergies  Allergen Reactions  . Cefuroxime Axetil     REACTION: pt not sure of reaction- she can tolerate amoxil  . Doxycycline Itching  . Latex Itching  . Lovastatin Other (See Comments)    Muscle aches   . Sulfonamide Derivatives     REACTION: pt not sure of reaction    Social Hx:   Social History   Socioeconomic History  . Marital status: Widowed    Spouse name: Not on file  . Number of children: 3  . Years of education: some college  . Highest education level: Not on file  Occupational History    Employer: RETIRED    Comment: Retired Stage manager)  Social Needs  . Financial resource strain: Not on file  . Food insecurity    Worry: Not on file    Inability: Not on file  . Transportation needs    Medical: Not on file    Non-medical: Not on file  Tobacco Use  . Smoking status: Never Smoker  . Smokeless tobacco: Never Used  Substance and Sexual Activity  . Alcohol use: No  . Drug use: No  . Sexual activity: Never  Lifestyle  . Physical activity    Days per  week: Not on file    Minutes per session: Not on file  . Stress: Not on file  Relationships  . Social Herbalist on phone: Not on file    Gets together: Not on file    Attends religious service: Not on file    Active member of club or organization: Not on file    Attends meetings of clubs or organizations: Not on file    Relationship status: Not on file  . Intimate partner violence    Fear of current or ex partner: Not on file    Emotionally abused: Not on file    Physically abused: Not on file    Forced sexual activity: Not on file  Other Topics Concern  . Not on file  Social History Narrative   Lives alone (widowed 1998).   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      Does not have living will.  Does desire CPR.  Does not want life support for prolonged periods of time if futile.   2 sons and 1 daughter.        Right-handed.   No daily caffeine use.    Past Surgical Hx:  Past Surgical History:  Procedure Laterality Date  . COLONOSCOPY W/ POLYPECTOMY    . ELECTROCARDIOGRAM  12/04/2006  . IR RADIOLOGY PERIPHERAL GUIDED IV START  04/21/2018  . IR US GUIDE VASC ACCESS LEFT  04/21/2018  . NASAL SEPTUM SURGERY    . Stress Cardiolite  02/13/2002    Past Medical Hx:  Past Medical History:  Diagnosis Date  . ALLERGIC RHINITIS 07/26/2008  . ASTHMA 04/14/2007  . Asthma   . ASYMPTOMATIC POSTMENOPAUSAL STATUS 05/10/2008  . CHEST PAIN 08/26/2008  . COLONIC POLYPS, HX OF 04/14/2007   colonic leiomyoma  . Decreased grip strength   . Episcleritis   . FIBROIDS, UTERUS 05/10/2008  . Fibromyalgia   . Gait abnormality   . GERD 04/14/2007  . HYPERCHOLESTEROLEMIA 01/07/2008  . HYPERGLYCEMIA 11/28/2009  . HYPERTENSION 01/28/2008  . INSOMNIA 05/10/2008  . Irritable bowel syndrome 04/06/2010  . OSTEOARTHRITIS 11/28/2009  .  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 SINUSITIS 02/10/2009    Past Gynecological History:  SVD x 3. Diagnostic laparoscopy 2007 for ovarian cysts  No LMP recorded.  Patient is postmenopausal.  Family Hx:  Family History  Problem Relation Age of Onset  . Diabetes Mother   . Hypertension Mother   . Glaucoma Mother   . Polymyositis Mother   . Heart disease Father        CHF  . Allergic rhinitis Father   . Asthma Father   . Other Father        sideoblastic anemia  . Allergic rhinitis Sister   . Asthma Sister   . Allergic rhinitis Brother   . Asthma Brother   . Allergic rhinitis Maternal Aunt   . Asthma Maternal Aunt   . Allergic rhinitis Paternal Aunt   . Asthma Paternal Aunt   . Breast cancer Cousin   . Colon cancer Cousin   . Cancer Neg Hx        No FH of Colon Cancer  . Angioedema Neg Hx   . Atopy Neg Hx   . Immunodeficiency Neg Hx   . Urticaria Neg Hx   . Eczema Neg Hx     Review of Systems:  Constitutional  Feels well,    ENT Normal appearing ears and nares bilaterally Skin/Breast  No rash, sores, jaundice, itching, dryness Cardiovascular  No chest pain, shortness of breath, or edema  Pulmonary  No cough or wheeze.  Gastro Intestinal  No nausea, vomitting, or diarrhoea. No bright red blood per rectum, no abdominal pain, change in bowel movement, or constipation.  Genito Urinary  No frequency, urgency, dysuria, mild pelvic pain Musculo Skeletal  No myalgia, arthralgia, joint swelling or pain  Neurologic  No weakness, numbness, change in gait,  Psychology  No depression, anxiety, insomnia.   Vitals:  Blood pressure (!) 158/57, pulse 67, temperature 98.2 F (36.8 C), temperature source Temporal, resp. rate 16, height 5\' 2"  (1.575 m), weight 134 lb 7 oz (61 kg), SpO2 100 %.  Physical Exam: WD in NAD Neck  Supple NROM, without any enlargements.  Lymph Node Survey No cervical supraclavicular or inguinal adenopathy Cardiovascular  Pulse normal rate, regularity and rhythm. S1 and S2 normal.  Lungs  Clear to auscultation bilateraly, without wheezes/crackles/rhonchi. Good air movement.  Skin  No  rash/lesions/breakdown  Psychiatry  Alert and oriented to person, place, and time  Abdomen  Normoactive bowel sounds, abdomen soft, non-tender and nonobese without evidence of hernia.  Back No CVA tenderness Genito Urinary  Vulva/vagina: Normal external female genitalia.  No lesions. No discharge or bleeding.  Bladder/urethra:  No lesions or masses, well supported bladder  Vagina: normal  Cervix: Normal appearing, no lesions.  Uterus:  Small, mobile, no parametrial involvement or nodularity.  Adnexa: no palpable masses. There is some mild tenderness in the RLQ Rectal  deferred.  Extremities  No bilateral cyanosis, clubbing or edema.   Thereasa Solo, MD  06/12/2019, 10:06 AM

## 2019-06-22 ENCOUNTER — Other Ambulatory Visit: Payer: Self-pay

## 2019-06-22 ENCOUNTER — Ambulatory Visit
Admission: RE | Admit: 2019-06-22 | Discharge: 2019-06-22 | Disposition: A | Discharge status: Home / Self Care | Payer: Medicare Other | Source: Ambulatory Visit | Attending: Gastroenterology | Admitting: Gastroenterology

## 2019-06-22 DIAGNOSIS — R102 Pelvic and perineal pain: Secondary | ICD-10-CM

## 2019-06-22 DIAGNOSIS — R1030 Lower abdominal pain, unspecified: Secondary | ICD-10-CM | POA: Diagnosis not present

## 2019-06-22 MED ORDER — IOPAMIDOL (ISOVUE-300) INJECTION 61%
100.0000 mL | Freq: Once | INTRAVENOUS | Status: AC | PRN
  Administered 2019-06-22: 100 mL via INTRAVENOUS

## 2019-06-24 ENCOUNTER — Telehealth: Payer: Self-pay | Admitting: Family Medicine

## 2019-06-24 MED ORDER — MONTELUKAST SODIUM 10 MG PO TABS: 10.0000 mg | ORAL_TABLET | Freq: Every day | ORAL | 2 refills | Status: DC

## 2019-06-24 NOTE — Telephone Encounter (Signed)
Patient is requesting a refill  SINGULAIR 10 mg Would like 90 day supply   Meds By Valeta Harms

## 2019-06-24 NOTE — Telephone Encounter (Signed)
RX refilled  

## 2019-07-13 ENCOUNTER — Ambulatory Visit (INDEPENDENT_AMBULATORY_CARE_PROVIDER_SITE_OTHER): Payer: Medicare Other

## 2019-07-13 ENCOUNTER — Ambulatory Visit: Payer: Medicare Other

## 2019-07-13 VITALS — Wt 132.0 lb

## 2019-07-13 DIAGNOSIS — Z0000 Encounter for general adult medical examination without abnormal findings: Secondary | ICD-10-CM | POA: Diagnosis not present

## 2019-07-13 NOTE — Progress Notes (Signed)
Subjective:   Samantha [name] is a 73 y.o. female who presents for Medicare Annual (Subsequent) preventive examination.  Review of Systems:    This visit is being conducted through telemedicine via telephone at the nurse health advisor's home address due to the COVID-19 pandemic. This patient has given me verbal consent via doximity to conduct this visit, patient states they are participating from their home address. Patient and myself are on the telephone call. There is no referral for this visit. Some vital signs may be absent or patient reported.    Patient identification: identified by name, DOB, and current address   Cardiac Risk Factors include: hypertension;advanced age (>52men, >36 women)     Objective:     Vitals: Wt 132 lb (59.9 kg)   BMI 24.14 kg/m   Body mass index is 24.14 kg/m.  Advanced Directives 07/13/2019 03/10/2019 07/09/2018 07/15/2017 07/03/2017 06/27/2016 01/02/2016  Does Patient Have a Medical Advance Directive? No Yes No No No Yes No  Type of Advance Directive - Healthcare Power of [place] -  Does patient want to make changes to medical advance directive? - - - Yes (MAU/Ambulatory/Procedural Areas - Information given) Yes (MAU/Ambulatory/Procedural Areas - Information given) No - Patient declined -  Copy of [place] in Chart? - - - - - No - copy requested -  Would patient like information on creating a medical advance directive? Yes (MAU/Ambulatory/Procedural Areas - Information given) - Yes (MAU/Ambulatory/Procedural Areas - Information given) - - - Yes - Educational materials given    Tobacco Social History   Tobacco Use  Smoking Status Never Smoker  Smokeless Tobacco Never Used     Counseling given: Not Answered   Clinical Intake:  Pre-visit preparation completed: Yes  Pain : No/denies pain     Nutritional Risks: None Diabetes: No  How often do you need to have someone help you when  you read instructions, pamphlets, or other written materials from your doctor or pharmacy?: 1 - Never What is the last grade level you completed in school?: some college  Interpreter Needed?: No  Information entered by :: CJohnson, LPN  Past Medical History:  Diagnosis Date  . ALLERGIC RHINITIS 07/26/2008  . ASTHMA 04/14/2007  . Asthma   . ASYMPTOMATIC POSTMENOPAUSAL STATUS 05/10/2008  . CHEST PAIN 08/26/2008  . COLONIC POLYPS, HX OF 04/14/2007   colonic leiomyoma  . Decreased grip strength   . Episcleritis   . FIBROIDS, UTERUS 05/10/2008  . Fibromyalgia   . Gait abnormality   . GERD 04/14/2007  . HYPERCHOLESTEROLEMIA 01/07/2008  . HYPERGLYCEMIA 11/28/2009  . HYPERTENSION 01/28/2008  . INSOMNIA 05/10/2008  . Irritable bowel syndrome 04/06/2010  . OSTEOARTHRITIS 11/28/2009  . 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 SINUSITIS 02/10/2009   Past Surgical History:  Procedure Laterality Date  . COLONOSCOPY W/ POLYPECTOMY    . ELECTROCARDIOGRAM  12/04/2006  . IR RADIOLOGY PERIPHERAL GUIDED IV START  04/21/2018  . IR US GUIDE VASC ACCESS LEFT  04/21/2018  . NASAL SEPTUM SURGERY    . Stress Cardiolite  02/13/2002   Family History  Problem Relation Age of Onset  . Diabetes Mother   . Hypertension Mother   . Glaucoma Mother   . Polymyositis Mother   . Heart disease Father        CHF  . Allergic rhinitis Father   . Asthma Father   . Other Father        sideoblastic anemia  .  Allergic rhinitis Sister   . Asthma Sister   . Allergic rhinitis Brother   . Asthma Brother   . Allergic rhinitis Maternal Aunt   . Asthma Maternal Aunt   . Allergic rhinitis Paternal Aunt   . Asthma Paternal Aunt   . Breast cancer Cousin   . Colon cancer Cousin   . Cancer Neg Hx        No FH of Colon Cancer  . Angioedema Neg Hx   . Atopy Neg Hx   . Immunodeficiency Neg Hx   . Urticaria Neg Hx   . Eczema Neg Hx    Social History   Socioeconomic History  . Marital status: Widowed    Spouse name: Not on file  .  Number of children: 3  . Years of education: some college  . Highest education level: Not on file  Occupational History    Employer: RETIRED    Comment: Retired Stage manager)  Social Needs  . Financial resource strain: Not hard at all  . Food insecurity    Worry: Never true    Inability: Never true  . Transportation needs    Medical: No    Non-medical: No  Tobacco Use  . Smoking status: Never Smoker  . Smokeless tobacco: Never Used  Substance and Sexual Activity  . Alcohol use: No  . Drug use: No  . Sexual activity: Never  Lifestyle  . Physical activity    Days per week: 0 days    Minutes per session: 0 min  . Stress: Not at all  Relationships  . Social Herbalist on phone: Not on file    Gets together: Not on file    Attends religious service: Not on file    Active member of club or organization: Not on file    Attends meetings of clubs or organizations: Not on file    Relationship status: Not on file  Other Topics Concern  . Not on file  Social History Narrative   Lives alone (widowed 1998).   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      Does not have living will.  Does desire CPR.  Does not want life support for prolonged periods of time if futile.   2 sons and 1 daughter.        Right-handed.   No daily caffeine use.    Outpatient Encounter Medications as of 07/13/2019  Medication Sig  . acetaminophen (TYLENOL) 500 MG tablet Take 1,000 mg by mouth every 6 (six) hours as needed for mild pain or headache.  . albuterol (VENTOLIN HFA) 108 (90 Base) MCG/ACT inhaler Inhale 2 puffs into the lungs every 4 (four) hours as needed for wheezing or shortness of breath.  . Azelastine HCl 0.15 % SOLN Place 2 sprays into both nostrils 2 (two) times daily as needed (runny nose).  . Beclomethasone Dipropionate (QNASL) 80 MCG/ACT AERS Place 2 sprays into both nostrils daily. Two sprays into both nostrils daily  . bifidobacterium infantis (ALIGN) capsule Take 1  capsule by mouth daily.    . budesonide-formoterol (SYMBICORT) 160-4.5 MCG/ACT inhaler Inhale 2 puffs into the lungs 2 (two) times daily.  . cetirizine (ZYRTEC) 10 MG tablet Take 10 mg by mouth at bedtime.   . Coenzyme Q-10 60 MG CAPS Take 60 capsules by mouth daily.   . fluticasone furoate-vilanterol (BREO ELLIPTA) 100-25 MCG/INH AEPB Inhale 1 puff into the lungs daily.  Marland Kitchen losartan-hydrochlorothiazide (HYZAAR) 100-12.5 MG tablet Take  1 tablet by mouth daily.  . Magnesium Malate POWD (1250mg  tabs) 1 tablet by mouth two times a day  . Manganese 50 MG TABS Take 50 mg by mouth at bedtime.   . Misc Natural Products (TART CHERRY ADVANCED PO) Take 2 tablets by mouth daily.   . montelukast (SINGULAIR) 10 MG tablet Take 1 tablet (10 mg total) by mouth at bedtime.  . Olopatadine HCl (PATADAY) 0.2 % SOLN Place 1 drop into both eyes daily as needed (itchy eyes).  . Omega-3 Fatty Acids (OMEGA 3 PO) Take 1 capsule by mouth daily.   . pantoprazole (PROTONIX) 40 MG tablet Take 1 tablet (40 mg total) by mouth 2 (two) times daily.  Vladimir Faster Glycol-Propyl Glycol (SYSTANE) 0.4-0.3 % GEL ophthalmic gel Place 1 application into both eyes every 6 (six) hours as needed (dry eyes).  . sucralfate (CARAFATE) 1 g tablet Take 1 tablet (1 g total) by mouth 2 (two) times daily.  Marland Kitchen thiamine 100 MG tablet Take 100 mg by mouth daily with supper.   . TURMERIC PO Take 1 tablet by mouth 2 (two) times a day.   Marland Kitchen VITAMIN D PO Take 1 tablet by mouth daily.   No facility-administered encounter medications on file as of 07/13/2019.     Activities of Daily Living In your present state of health, do you have any difficulty performing the following activities: 07/13/2019  Hearing? N  Vision? N  Difficulty concentrating or making decisions? Y  Comment forgets what she just read sometimes  Walking or climbing stairs? Y  Comment patient gets short of breath when climbing stairs  Dressing or bathing? N  Doing errands, shopping? N   Preparing Food and eating ? N  Using the Toilet? N  In the past six months, have you accidently leaked urine? N  Do you have problems with loss of bowel control? N  Managing your Medications? N  Managing your Finances? N  Housekeeping or managing your Housekeeping? N  Some recent data might be hidden    Patient Care Team: Tonia Ghent, MD as PCP - General (Family Medicine) Deneise Lever, MD (Pulmonary Disease) Ladene Artist, MD (Gastroenterology) Bo Merino, MD (Rheumatology) Arvella Nigh, MD as Consulting Physician (Obstetrics and Gynecology) Leandrew Koyanagi, MD as Referring Physician (Ophthalmology) Melissa Montane, MD as Consulting Physician (Otolaryngology) Melrose Nakayama, MD as Consulting Physician (Orthopedic Surgery) Druscilla Brownie, MD as Consulting Physician (Dermatology) Everitt Amber, MD as Consulting Physician (Obstetrics and Gynecology) Otis Brace, MD as Consulting Physician (Gastroenterology) Arvella Nigh, MD as Consulting Physician (Obstetrics and Gynecology) Neldon Mc Donnamarie Poag, MD as Consulting Physician (Allergy and Immunology)    Assessment:   This is a routine wellness examination for [place].  Exercise Activities and Dietary recommendations Current Exercise Habits: The patient does not participate in regular exercise at present, Exercise limited by: None identified  Goals    . Increase physical activity     Starting 07/09/2018, I will continue to drink at least 6-8 glasses of water daily.     . Patient Stated     07/13/2019, I will exercise more daily and try to walk on a daily basis.        Fall Risk Fall Risk  07/13/2019 07/28/2018 07/09/2018 07/03/2017 06/27/2016  Falls in the past year? 0 0 No No No  Comment - Emmi Telephone Survey: data to providers prior to load - - -  Number falls in past yr: 0 - - - -  Injury with Fall? 0 - - - -  Risk for fall due to : Medication side effect;Impaired balance/gait - - - -  Follow up Falls  evaluation completed;Falls prevention discussed - - - -   Is the patient's home free of loose throw rugs in walkways, pet beds, electrical cords, etc?   yes      Grab bars in the bathroom? no      Handrails on the stairs?   yes      Adequate lighting?   yes  Timed Get Up and Go performed: N/A  Depression Screen PHQ 2/9 Scores 07/13/2019 07/09/2018 07/03/2017 06/27/2016  PHQ - 2 Score 0 0 1 0  PHQ- 9 Score 0 0 7 -     Cognitive Function MMSE - Mini Mental State Exam 07/13/2019 07/09/2018 07/03/2017 06/27/2016  Orientation to time 5 5 5 5   Orientation to Place 5 5 5 5   Registration 3 3 3 3   Attention/ Calculation 5 0 0 0  Recall 3 3 3 3   Language- name 2 objects - 0 0 0  Language- repeat 1 1 1 1   Language- follow 3 step command - 3 3 3   Language- read & follow direction - 0 0 0  Write a sentence - 0 0 0  Copy design - 0 0 0  Total score - 20 20 20   Mini Cog  Mini-Cog screen was completed. Maximum score is 22. A value of 0 denotes this part of the MMSE was not completed or the patient failed this part of the Mini-Cog screening.       Immunization History  Administered Date(s) Administered  . Fluad Quad(high Dose 65+) 05/07/2019  . Influenza Split 06/04/2011, 05/27/2012  . Influenza Whole 08/18/2008, 05/26/2010  . Influenza, High Dose Seasonal PF 05/22/2017, 06/11/2018  . Influenza,inj,Quad PF,6+ Mos 06/08/2013, 05/19/2014, 06/08/2015, 05/07/2016  . Pneumococcal Conjugate-13 03/17/2014  . Pneumococcal Polysaccharide-23 11/23/2009, 06/08/2015, 07/14/2018  . Td 11/09/2002  . Tdap 07/03/2013  . Zoster 06/17/2013  . Zoster Recombinat (Shingrix) 02/19/2019    Qualifies for Shingles Vaccine? Administered 02/19/2019  Screening Tests Health Maintenance  Topic Date Due  . COLONOSCOPY  01/12/2020  . MAMMOGRAM  11/12/2020  . TETANUS/TDAP  07/04/2023  . INFLUENZA VACCINE  Completed  . DEXA SCAN  Completed  . Hepatitis C Screening  Completed  . PNA vac Low Risk Adult   Completed    Cancer Screenings: Lung: Low Dose CT Chest recommended if Age 45-80 years, 30 pack-year currently smoking OR have quit w/in 15years. Patient does not qualify. Breast:  Up to date on Mammogram? Yes, completed 11/13/2018   Up to date of Bone Density/Dexa? Yes, completed 08/04/2018 Colorectal: completed 01/11/2017  Additional Screenings:  Hepatitis C Screening: 06/27/2016     Plan:   Patient will exercise and try to walk more on a daily basis.  I have personally reviewed and noted the following in the patient's chart:   . Medical and social history . Use of alcohol, tobacco or illicit drugs  . Current medications and supplements . Functional ability and status . Nutritional status . Physical activity . Advanced directives . List of other physicians . Hospitalizations, surgeries, and ER visits in previous 12 months . Vitals . Screenings to include cognitive, depression, and falls . Referrals and appointments  In addition, I have reviewed and discussed with patient certain preventive protocols, quality metrics, and best practice recommendations. A written personalized care plan for preventive services as well as general preventive health recommendations were provided to patient.     Wynetta Emery,  Dewitt Hoes, LPN  [id number]

## 2019-07-13 NOTE — Patient Instructions (Signed)
[name] , Thank you for taking time to come for your Medicare Wellness Visit. I appreciate your ongoing commitment to your health goals. Please review the following plan we discussed and let me know if I can assist you in the future.   Screening recommendations/referrals: Colonoscopy: up to date, completed 01/11/2017 Mammogram: up to date, completed 11/13/2018 Bone Density: up to date, completed 08/04/2018 Recommended yearly ophthalmology/optometry visit for glaucoma screening and checkup Recommended yearly dental visit for hygiene and checkup  Vaccinations: Influenza vaccine: up to date, completed 05/07/2019 Pneumococcal vaccine: Completed series Tdap vaccine: up to date, completed 07/03/2013 Shingles vaccine: #1 completed 02/19/2019    Advanced directives: Please bring a copy of your POA (Power of [place]) and/or Living Will to your next appointment once you get it completed.   Conditions/risks identified: hypertension  Next appointment: 07/20/2019 @ 9:45 am    Preventive Care 65 Years and Older, Female Preventive care refers to lifestyle choices and visits with your health care provider that can promote health and wellness. What does preventive care include?  A yearly physical exam. This is also called an annual well check.  Dental exams once or twice a year.  Routine eye exams. Ask your health care provider how often you should have your eyes checked.  Personal lifestyle choices, including:  Daily care of your teeth and gums.  Regular physical activity.  Eating a healthy diet.  Avoiding tobacco and drug use.  Limiting alcohol use.  Practicing safe sex.  Taking low-dose aspirin every day.  Taking vitamin and mineral supplements as recommended by your health care provider. What happens during an annual well check? The services and screenings done by your health care provider during your annual well check will depend on your age, overall health, lifestyle risk  factors, and family history of disease. Counseling  Your health care provider may ask you questions about your:  Alcohol use.  Tobacco use.  Drug use.  Emotional well-being.  Home and relationship well-being.  Sexual activity.  Eating habits.  History of falls.  Memory and ability to understand (cognition).  Work and work Statistician.  Reproductive health. Screening  You may have the following tests or measurements:  Height, weight, and BMI.  Blood pressure.  Lipid and cholesterol levels. These may be checked every 5 years, or more frequently if you are over 48 years old.  Skin check.  Lung cancer screening. You may have this screening every year starting at age 34 if you have a 30-pack-year history of smoking and currently smoke or have quit within the past 15 years.  Fecal occult blood test (FOBT) of the stool. You may have this test every year starting at age 37.  Flexible sigmoidoscopy or colonoscopy. You may have a sigmoidoscopy every 5 years or a colonoscopy every 10 years starting at age 34.  Hepatitis C blood test.  Hepatitis B blood test.  Sexually transmitted disease (STD) testing.  Diabetes screening. This is done by checking your blood sugar (glucose) after you have not eaten for a while (fasting). You may have this done every 1-3 years.  Bone density scan. This is done to screen for osteoporosis. You may have this done starting at age 36.  Mammogram. This may be done every 1-2 years. Talk to your health care provider about how often you should have regular mammograms. Talk with your health care provider about your test results, treatment options, and if necessary, the need for more tests. Vaccines  Your health care provider  may recommend certain vaccines, such as:  Influenza vaccine. This is recommended every year.  Tetanus, diphtheria, and acellular pertussis (Tdap, Td) vaccine. You may need a Td booster every 10 years.  Zoster vaccine. You  may need this after age 69.  Pneumococcal 13-valent conjugate (PCV13) vaccine. One dose is recommended after age 38.  Pneumococcal polysaccharide (PPSV23) vaccine. One dose is recommended after age 47. Talk to your health care provider about which screenings and vaccines you need and how often you need them. This information is not intended to replace advice given to you by your health care provider. Make sure you discuss any questions you have with your health care provider. Document Released: 09/23/2015 Document Revised: 05/16/2016 Document Reviewed: 06/28/2015 Elsevier Interactive Patient Education  2017 [employer] Prevention in the Home Falls can cause injuries. They can happen to people of all ages. There are many things you can do to make your home safe and to help prevent falls. What can I do on the outside of my home?  Regularly fix the edges of walkways and driveways and fix any cracks.  Remove anything that might make you trip as you walk through a door, such as a raised step or threshold.  Trim any bushes or trees on the path to your home.  Use bright outdoor lighting.  Clear any walking paths of anything that might make someone trip, such as rocks or tools.  Regularly check to see if handrails are loose or broken. Make sure that both sides of any steps have handrails.  Any raised decks and porches should have guardrails on the edges.  Have any leaves, snow, or ice cleared regularly.  Use sand or salt on walking paths during winter.  Clean up any spills in your garage right away. This includes oil or grease spills. What can I do in the bathroom?  Use night lights.  Install grab bars by the toilet and in the tub and shower. Do not use towel bars as grab bars.  Use non-skid mats or decals in the tub or shower.  If you need to sit down in the shower, use a plastic, non-slip stool.  Keep the floor dry. Clean up any water that spills on the floor as soon as  it happens.  Remove soap buildup in the tub or shower regularly.  Attach bath mats securely with double-sided non-slip rug tape.  Do not have throw rugs and other things on the floor that can make you trip. What can I do in the bedroom?  Use night lights.  Make sure that you have a light by your bed that is easy to reach.  Do not use any sheets or blankets that are too big for your bed. They should not hang down onto the floor.  Have a firm chair that has side arms. You can use this for support while you get dressed.  Do not have throw rugs and other things on the floor that can make you trip. What can I do in the kitchen?  Clean up any spills right away.  Avoid walking on wet floors.  Keep items that you use a lot in easy-to-reach places.  If you need to reach something above you, use a strong step stool that has a grab bar.  Keep electrical cords out of the way.  Do not use floor polish or wax that makes floors slippery. If you must use wax, use non-skid floor wax.  Do not have throw rugs  and other things on the floor that can make you trip. What can I do with my stairs?  Do not leave any items on the stairs.  Make sure that there are handrails on both sides of the stairs and use them. Fix handrails that are broken or loose. Make sure that handrails are as long as the stairways.  Check any carpeting to make sure that it is firmly attached to the stairs. Fix any carpet that is loose or worn.  Avoid having throw rugs at the top or bottom of the stairs. If you do have throw rugs, attach them to the floor with carpet tape.  Make sure that you have a light switch at the top of the stairs and the bottom of the stairs. If you do not have them, ask someone to add them for you. What else can I do to help prevent falls?  Wear shoes that:  Do not have high heels.  Have rubber bottoms.  Are comfortable and fit you well.  Are closed at the toe. Do not wear sandals.  If  you use a stepladder:  Make sure that it is fully opened. Do not climb a closed stepladder.  Make sure that both sides of the stepladder are locked into place.  Ask someone to hold it for you, if possible.  Clearly mark and make sure that you can see:  Any grab bars or handrails.  First and last steps.  Where the edge of each step is.  Use tools that help you move around (mobility aids) if they are needed. These include:  Canes.  Walkers.  Scooters.  Crutches.  Turn on the lights when you go into a dark area. Replace any light bulbs as soon as they burn out.  Set up your furniture so you have a clear path. Avoid moving your furniture around.  If any of your floors are uneven, fix them.  If there are any pets around you, be aware of where they are.  Review your medicines with your doctor. Some medicines can make you feel dizzy. This can increase your chance of falling. Ask your doctor what other things that you can do to help prevent falls. This information is not intended to replace advice given to you by your health care provider. Make sure you discuss any questions you have with your health care provider. Document Released: 06/23/2009 Document Revised: 02/02/2016 Document Reviewed: 10/01/2014 Elsevier Interactive Patient Education  2017 [employer].

## 2019-07-13 NOTE — Progress Notes (Signed)
PCP notes:  Health Maintenance: needs 2nd Shingrix vaccine    Abnormal Screenings: none    Patient concerns: none    Nurse concerns: none    Next PCP appt.: 07/20/2019 @ 9:45 am

## 2019-07-14 ENCOUNTER — Other Ambulatory Visit: Payer: Self-pay | Admitting: Family Medicine

## 2019-07-14 ENCOUNTER — Other Ambulatory Visit (INDEPENDENT_AMBULATORY_CARE_PROVIDER_SITE_OTHER): Payer: Medicare Other

## 2019-07-14 DIAGNOSIS — I10 Essential (primary) hypertension: Secondary | ICD-10-CM

## 2019-07-14 DIAGNOSIS — E559 Vitamin D deficiency, unspecified: Secondary | ICD-10-CM | POA: Diagnosis not present

## 2019-07-14 LAB — COMPREHENSIVE METABOLIC PANEL
ALT: 9 U/L (ref 0–35)
AST: 17 U/L (ref 0–37)
Albumin: 4.5 g/dL (ref 3.5–5.2)
Alkaline Phosphatase: 58 U/L (ref 39–117)
BUN: 14 mg/dL (ref 6–23)
CO2: 31 mEq/L (ref 19–32)
Calcium: 9.9 mg/dL (ref 8.4–10.5)
Chloride: 102 mEq/L (ref 96–112)
Creatinine, Ser: 0.85 mg/dL (ref 0.40–1.20)
GFR: 79.13 mL/min (ref >60.00)
Glucose, Bld: 91 mg/dL (ref 70–99)
Potassium: 4.2 mEq/L (ref 3.5–5.1)
Sodium: 139 mEq/L (ref 135–145)
Total Bilirubin: 0.5 mg/dL (ref 0.2–1.2)
Total Protein: 7 g/dL (ref 6.0–8.3)

## 2019-07-14 LAB — VITAMIN D 25 HYDROXY (VIT D DEFICIENCY, FRACTURES): VITD: 54.05 ng/mL (ref 30.00–100.00)

## 2019-07-14 LAB — LIPID PANEL
Cholesterol: 236 mg/dL — ABNORMAL HIGH (ref 0–200)
HDL: 107.7 mg/dL (ref >39.00)
LDL Cholesterol: 114 mg/dL — ABNORMAL HIGH (ref 0–99)
NonHDL: 128.18
Total CHOL/HDL Ratio: 2
Triglycerides: 72 mg/dL (ref 0.0–149.0)
VLDL: 14.4 mg/dL (ref 0.0–40.0)

## 2019-07-17 NOTE — Progress Notes (Signed)
Virtual Visit via Telephone Note  I connected with Samantha [name] on 07/31/19 at 12:45 PM EST by telephone and verified that I am speaking with the correct person using two identifiers.  Location: Patient: Home  Provider: Clinic  This service was conducted via virtual visit.  The patient was located at home. I was located in my office.  Consent was obtained prior to the virtual visit and is aware of possible charges through their insurance for this visit.  The patient is an established patient.  Dr. Estanislado Pandy, MD conducted the virtual visit and Hazel Sams, PA-C acted as scribe during the service.  Office staff helped with scheduling follow up visits after the service was conducted.     I discussed the limitations, risks, security and privacy concerns of performing an evaluation and management service by telephone and the availability of in person appointments. I also discussed with the patient that there may be a patient responsible charge related to this service. The patient expressed understanding and agreed to proceed.  CC: Right shoulder joint pain  History of Present Illness: Patient is a 73 year old female with a past medical history of fibromyalgia, Raynaud's, and osteoarthritis.  She denies any joint pain or joint swelling.  She is having pain in the right shoulder joint after closing her car trunk.  She was evaluated by her PCP who obtained x-rays.  She continues to have muscle tenderness surrounding the right shoulder joint.  She continues to have occasional mid-thoracic pain. She denies any hand pain at this time.  She continues to have chronic fatigue but states she has been sleeping better.  Review of Systems  Constitutional: Positive for malaise/fatigue. Negative for fever.  HENT: Positive for congestion.        +Dry mouth  Eyes: Negative for photophobia, pain, discharge and redness.       +Dry eyes  Respiratory: Positive for shortness of breath. Negative for cough and  wheezing.   Cardiovascular: Negative for chest pain and palpitations.  Gastrointestinal: Negative for blood in stool, constipation and diarrhea.  Genitourinary: Negative for dysuria.  Musculoskeletal: Positive for back pain, joint pain and myalgias. Negative for neck pain.  Skin: Negative for rash.  Neurological: Negative for dizziness and headaches.  Psychiatric/Behavioral: Negative for depression. The patient is not nervous/anxious and does not have insomnia.         Observations/Objective: Physical Exam  Constitutional: She is oriented to person, place, and time.  Neurological: She is alert and oriented to person, place, and time.  Psychiatric: Mood, memory, affect and judgment normal.   Patient reports morning stiffness for 0  minutes.   Patient denies nocturnal pain.  Difficulty dressing/grooming: Denies Difficulty climbing stairs: Denies Difficulty getting out of chair: Reports Difficulty using hands for taps, buttons, cutlery, and/or writing: Denies   Assessment and Plan: Visit Diagnoses: Fibromyalgia-She continues to have generalized muscle aches and muscle tenderness due to fibromyalgia..  She takes magnesium malate, manganese, tart cherry, and tumeric, which she have found to be effective. She continues to have intermittent muscle spasms.  She plans on continuing on the supplements.  She has chronic fatigue but has been sleeping better at night.  She will follow up in 6 months.   Raynaud's disease without gangrene-Currently not active.  Chronic midline thoracic back pain - X-ray was consistent with multilevel spondylosis and osteophytes.  She continues to have intermittent mid-thoracic pain.  Primary osteoarthritis of both hands-Her hand pain has improved.  She has not had  any joint swelling. She has no difficulty with ADLs.   Primary osteoarthritis of both hips-She has no hip joint pain/groin pain at this time.  Primary osteoarthritis of both knees-She has no knee  joint pain at this time. She has no joint swelling.  She has no difficulty climbing steps.  She experiences some discomfort when getting up from a chair.   Primary osteoarthritis of both feet-She has no discomfort in her feet at this time.   Other insomnia-She has not had any difficulty sleeping at night.  Other fatigue: She has chronic fatigue.   History of vertebral compression fracture - L4 compression fracture - DEXA on 08/05/19: T score -1.1 right femoral neck.    History of vitamin D deficiency: She is taking a vitamin D supplement.   Other medical problems are listed as follows:  Bilateral carpal tunnel syndrome  History of hyperlipidemia  History of diverticulosis  History of glaucoma  History of asthma  History of kidney stones  History of IBS  History of hypertension  History of gastroesophageal reflux (GERD)   Follow Up Instructions: She will follow up in 6 months.    I discussed the assessment and treatment plan with the patient. The patient was provided an opportunity to ask questions and all were answered. The patient agreed with the plan and demonstrated an understanding of the instructions.   The patient was advised to call back or seek an in-person evaluation if the symptoms worsen or if the condition fails to improve as anticipated.  I provided 25 minutes of non-face-to-face time during this encounter.   Bo Merino, MD   Scribed by-  Hazel Sams PA-C

## 2019-07-20 ENCOUNTER — Other Ambulatory Visit: Payer: Self-pay

## 2019-07-20 ENCOUNTER — Ambulatory Visit (INDEPENDENT_AMBULATORY_CARE_PROVIDER_SITE_OTHER): Payer: Medicare Other | Admitting: Family Medicine

## 2019-07-20 ENCOUNTER — Encounter: Payer: Self-pay | Admitting: Family Medicine

## 2019-07-20 VITALS — BP 132/64 | HR 67 | Temp 97.7°F | Ht 62.0 in | Wt 133.4 lb

## 2019-07-20 DIAGNOSIS — M8580 Other specified disorders of bone density and structure, unspecified site: Secondary | ICD-10-CM | POA: Diagnosis not present

## 2019-07-20 DIAGNOSIS — E7800 Pure hypercholesterolemia, unspecified: Secondary | ICD-10-CM

## 2019-07-20 DIAGNOSIS — R05 Cough: Secondary | ICD-10-CM

## 2019-07-20 DIAGNOSIS — R059 Cough, unspecified: Secondary | ICD-10-CM

## 2019-07-20 DIAGNOSIS — R109 Unspecified abdominal pain: Secondary | ICD-10-CM

## 2019-07-20 DIAGNOSIS — R1310 Dysphagia, unspecified: Secondary | ICD-10-CM | POA: Diagnosis not present

## 2019-07-20 DIAGNOSIS — I10 Essential (primary) hypertension: Secondary | ICD-10-CM | POA: Diagnosis not present

## 2019-07-20 DIAGNOSIS — Z7189 Other specified counseling: Secondary | ICD-10-CM

## 2019-07-20 NOTE — Patient Instructions (Addendum)
Stop the CoQ and see how you do.  If you have more trouble with swallowing, then let me or the GI clinic now.   Let me update the pulmonary clinic.   We'll go from there. Take care.  Glad to see you.

## 2019-07-20 NOTE — Progress Notes (Signed)
She had follow up with the allergy clinic.  She is feeling better in the meantime.  She still has some coughing episodes.  Tesslon helps, used prn.   Previous imaging discussed with patient. 1. There is a mild cylindrical bronchiectasis in both lower lobes along with mild bibasilar scarring, but no current airway thickening or additional specific cause for the patient's persistent cough is identified. 2. Aortic Atherosclerosis (ICD10-I70.0). Mild cardiomegaly. Small inferior pericardial effusion. 3. Sludge versus gallstones in the gallbladder. 4. A small lesion of the posteromedial spleen was present in 2018 although no has a faint marginal calcification. Highly likely to be benign.  She is off breo and couldn't tell a change with med use.  She is on symbicort.  She has SABA to use prn, with relief.  No discolored sputum.    She is still on Qnasl but taking/needing astelin rarely.    She has some occ RUQ pain after eating, d/w pt about imaging as above.  She had follow-up imaging as listed below.  1. No acute findings in the abdomen or pelvis. No findings to explain the patient's history of pain. 2. 2.5 x 4.5 cm complex cystic lesion in the right adnexal space stable in the nearly 3 year interval since prior study, suggesting benign etiology. 3.  Aortic Atherosclerois (ICD10-170.0)  Hypertension:    Using medication without problems or lightheadedness: rarely lightheaded.  Cautions d/w pt. No presyncope.  No syncope.   Chest pain with exertion: she can have tightness but her SABA clearly helps with that, see above. Edema: rare Short of breath: see above.   Labs d/w pt.  Statin intolerant.    She can have aches from fibromyalgia at baseline.  She is off B12 with prev elevated level noted.   She has noted some dec in balance w/o falls.  D/w pt about trying balance HEP at home.    She has some occ dysphagia to solids, lower esophageal area, not daily, with stringy meats.  Encourage  GI eval.  She declined GI eval at this point.  D/w pt. No hematemesis or black stools.    Living will d/w pt.  Daughter Colletta [place] designated if patient were incapacitated.  She wanted to defer second shingles vaccine at this point.  This is reasonable.  PMH and SH reviewed  ROS: Per HPI unless specifically indicated in ROS section   Meds, vitals, and allergies reviewed.    GEN: nad, alert and oriented HEENT: ncat NECK: supple w/o LA CV: rrr PULM: ctab, no inc wob ABD: soft, +bs, not tender to palpation. EXT: no edema SKIN: no acute rash

## 2019-07-21 ENCOUNTER — Telehealth: Payer: Self-pay | Admitting: Family Medicine

## 2019-07-21 NOTE — Telephone Encounter (Signed)
Patient called today requesting information about a good diet for seniors and those who have high cholesterol.  She would like to know if this is something you could give her advice on and have any information you could give her

## 2019-07-22 NOTE — Telephone Encounter (Signed)
If she has access to a computer then I would have her look on [email or web address].  This is from the [employer].  On the top left of that website is a section for healthy living that has subheadings for heart healthy recipes, foods, diet tips.  I would have her start there.  Thanks.

## 2019-07-23 DIAGNOSIS — R1310 Dysphagia, unspecified: Secondary | ICD-10-CM | POA: Insufficient documentation

## 2019-07-23 DIAGNOSIS — R109 Unspecified abdominal pain: Secondary | ICD-10-CM | POA: Insufficient documentation

## 2019-07-23 NOTE — Assessment & Plan Note (Signed)
She has some occ dysphagia to solids, lower esophageal area, not daily, with stringy meats.  Encourage GI eval.  She declined GI eval at this point.  D/w pt. No hematemesis or black stools.  No emergent symptoms, discussed chewing her food well prior to swallowing.  She will update me if worse.

## 2019-07-23 NOTE — Assessment & Plan Note (Signed)
Reasonable control.  No change in meds at this point.

## 2019-07-23 NOTE — Assessment & Plan Note (Signed)
Living will d/w pt.  Daughter Colletta [place] designated if patient were incapacitated.

## 2019-07-23 NOTE — Assessment & Plan Note (Signed)
Benign abdominal exam today.  Not tender.  No right upper quadrant tenderness or rebound.  She had follow-up CAT scan that did not show any gallstones.  Would observe for now.  She can update me if worse in the meantime.

## 2019-07-23 NOTE — Assessment & Plan Note (Signed)
Vitamin D normal.  Continue as is.

## 2019-07-23 NOTE — Telephone Encounter (Signed)
Patient advised.

## 2019-07-23 NOTE — Assessment & Plan Note (Addendum)
She had follow up with the allergy clinic.  She is feeling better in the meantime.  She still has some coughing episodes.  Tesslon helps, used prn.  At this point, would continue current inhalers.  Lungs are clear.  >25 minutes spent in face to face time with patient, >50% spent in counselling or coordination of care.

## 2019-07-23 NOTE — Assessment & Plan Note (Signed)
Previously statin intolerant.  See follow-up phone note regarding diet.

## 2019-07-31 ENCOUNTER — Encounter: Payer: Self-pay | Admitting: Rheumatology

## 2019-07-31 ENCOUNTER — Other Ambulatory Visit: Payer: Self-pay

## 2019-07-31 ENCOUNTER — Telehealth (INDEPENDENT_AMBULATORY_CARE_PROVIDER_SITE_OTHER): Payer: Medicare Other | Admitting: Rheumatology

## 2019-07-31 DIAGNOSIS — G5603 Carpal tunnel syndrome, bilateral upper limbs: Secondary | ICD-10-CM

## 2019-07-31 DIAGNOSIS — Z8669 Personal history of other diseases of the nervous system and sense organs: Secondary | ICD-10-CM

## 2019-07-31 DIAGNOSIS — M797 Fibromyalgia: Secondary | ICD-10-CM

## 2019-07-31 DIAGNOSIS — M19041 Primary osteoarthritis, right hand: Secondary | ICD-10-CM | POA: Diagnosis not present

## 2019-07-31 DIAGNOSIS — M19072 Primary osteoarthritis, left ankle and foot: Secondary | ICD-10-CM

## 2019-07-31 DIAGNOSIS — Z8679 Personal history of other diseases of the circulatory system: Secondary | ICD-10-CM

## 2019-07-31 DIAGNOSIS — M160 Bilateral primary osteoarthritis of hip: Secondary | ICD-10-CM | POA: Diagnosis not present

## 2019-07-31 DIAGNOSIS — M546 Pain in thoracic spine: Secondary | ICD-10-CM

## 2019-07-31 DIAGNOSIS — Z8639 Personal history of other endocrine, nutritional and metabolic disease: Secondary | ICD-10-CM

## 2019-07-31 DIAGNOSIS — Z8709 Personal history of other diseases of the respiratory system: Secondary | ICD-10-CM

## 2019-07-31 DIAGNOSIS — Z87442 Personal history of urinary calculi: Secondary | ICD-10-CM

## 2019-07-31 DIAGNOSIS — M19071 Primary osteoarthritis, right ankle and foot: Secondary | ICD-10-CM

## 2019-07-31 DIAGNOSIS — Z8781 Personal history of (healed) traumatic fracture: Secondary | ICD-10-CM

## 2019-07-31 DIAGNOSIS — M170 Bilateral primary osteoarthritis of knee: Secondary | ICD-10-CM

## 2019-07-31 DIAGNOSIS — I7300 Raynaud's syndrome without gangrene: Secondary | ICD-10-CM | POA: Diagnosis not present

## 2019-07-31 DIAGNOSIS — G8929 Other chronic pain: Secondary | ICD-10-CM

## 2019-07-31 DIAGNOSIS — R5383 Other fatigue: Secondary | ICD-10-CM

## 2019-07-31 DIAGNOSIS — Z8719 Personal history of other diseases of the digestive system: Secondary | ICD-10-CM

## 2019-07-31 DIAGNOSIS — M19042 Primary osteoarthritis, left hand: Secondary | ICD-10-CM

## 2019-07-31 DIAGNOSIS — G4709 Other insomnia: Secondary | ICD-10-CM

## 2019-08-27 ENCOUNTER — Encounter: Payer: Self-pay | Admitting: Internal Medicine

## 2019-08-27 ENCOUNTER — Ambulatory Visit (INDEPENDENT_AMBULATORY_CARE_PROVIDER_SITE_OTHER): Payer: Medicare Other | Admitting: Internal Medicine

## 2019-08-27 ENCOUNTER — Other Ambulatory Visit: Payer: Self-pay

## 2019-08-27 ENCOUNTER — Ambulatory Visit (INDEPENDENT_AMBULATORY_CARE_PROVIDER_SITE_OTHER): Payer: Medicare Other

## 2019-08-27 DIAGNOSIS — R05 Cough: Secondary | ICD-10-CM | POA: Diagnosis not present

## 2019-08-27 DIAGNOSIS — J479 Bronchiectasis, uncomplicated: Secondary | ICD-10-CM

## 2019-08-27 DIAGNOSIS — R059 Cough, unspecified: Secondary | ICD-10-CM

## 2019-08-27 DIAGNOSIS — R21 Rash and other nonspecific skin eruption: Secondary | ICD-10-CM | POA: Diagnosis not present

## 2019-08-27 DIAGNOSIS — Z23 Encounter for immunization: Secondary | ICD-10-CM | POA: Diagnosis not present

## 2019-08-27 MED ORDER — BENZONATATE 200 MG PO CAPS: 200.0000 mg | ORAL_CAPSULE | Freq: Three times a day (TID) | ORAL | 1 refills | Status: DC | PRN

## 2019-08-27 MED ORDER — AZITHROMYCIN 250 MG PO TABS: ORAL_TABLET | ORAL | 0 refills | Status: DC

## 2019-08-27 NOTE — Patient Instructions (Addendum)
Okay to get your second shingles shot  I think you have had tinea, a mild skin fungus infection. It might help to use Head And Shoulders shampoo as a body wash for a month. You can also try using one of the otc skin fungus ointments like Tinactin, but you need to follow the directions on the box.  Script sent for Zpak antibiotic to see if it helps with the cough  Script sent for tessalon perles for cough  Please call if we can help

## 2019-08-27 NOTE — Assessment & Plan Note (Signed)
Chronic. Likely multifactorial. GERD contributes- discussed sleep position. Not sure if bronchiectasis contributes Plan- Zpak, tessalon

## 2019-08-27 NOTE — Assessment & Plan Note (Signed)
Tinea axillae Plan- try using Head and Shoulders shampoo as a body wash, also tinactin

## 2019-08-27 NOTE — Assessment & Plan Note (Signed)
Suspectt this may relate to hs of GERD w aspiration. Plan- Zpak. Watch for effect. Watch for [employer].

## 2019-08-27 NOTE — Progress Notes (Signed)
Patient ID: Samantha [name], female    DOB: 10-Feb-1946, 73 y.o.   MRN: [id number]  HPI female never smoker followed for allergic rhinitis, asthma, bronchiectasis,  complicated by GERD/ LPR, Raynaud's, HBP, IBS, Glaucoma Barium swallow 01/09/2016-normal  -------------------------------------------------------------------------------------- 04/30/2019- 73 year old female never smoker followed for allergic rhinitis, asthma, complicated by GERD/ LPR, Raynaud's, HBP, IBS, Glaucoma -----last seen 10/03/2016; referred by by Dr. Damita Dunnings (PCP) for cough ED [facility] 03/10/2019- SOB, Cough, R lateral chest wall pain, Covid swab neg. Sed rate and CRP low/ Nl 03/19/2019. -----last seen 10/03/2016; referred by by Dr. Damita Dunnings (PCP) for cough; pt states she has had a cough w/ varing thin an thick clear mucus all summer, has gotten CXR for it 04/23/2019 Albuterol hfa, azelastine nasal, Qnasl, Symbicort 160, singulair,  Protonix, Carafate,  Says Dr Neldon Mc did skin test "not much allergy". Had hoarseness and cough all summer. Uses spacer with symbicort, but says that symbicort and azelastine and Qnasl sprays "burn".  HOB up w adjustable bed, but stomach rumbles and lower esphagus "burn" as she lies down. Pending GI visit next week.  She doesn't suspect infection in sinuses or chest now.  Recent use of Rx mouth wash has helped sore throat Notes some soreness just below R clavicle- recent, no trauma. CXR 04/23/2019  IMPRESSION: Chronic interstitial and bronchitic changes in the lung bases similar to prior. No acute cardiopulmonary abnormality.  08/27/2019- 73 year old female never smoker followed for allergic rhinitis, asthma, bronchiectasis, complicated by GERD/ LPR, Raynaud's, HBP, IBS, Glaucoma Albuterol hfa, azelastine nasal, Qnasl, Symbicort 160, singulair, Zyrtec,  Protonix, Carafate,  -----c/o stable sob with exertion, sometimes prod cough with white mucus.  Persistent cough, white foamy sputum. Worse lying  down. Adjustable bed helps but must get elevation right to avoid mid thoracic back ache.  "Achey tired" mid chest sensation relieved by albuterol. Nothing acute or progressive.  Discussed mild bronchiectasis on CT.  PFT 06/05/2019- Mild restriction, normal flows and Diffusion CT chest 05/20/2019- IMPRESSION: 1. There is a mild cylindrical bronchiectasis in both lower lobes along with mild bibasilar scarring, but no current airway thickening or additional specific cause for the patient's persistent cough is identified. 2. Aortic Atherosclerosis (ICD10-I70.0). Mild cardiomegaly. Small inferior pericardial effusion. 3. Sludge versus gallstones in the gallbladder. 4. A small lesion of the posteromedial spleen was present in 2018 although no has a faint marginal calcification. Highly likely to be benign.  Review of Systems-see HPI   + = positive Constitutional:   No weight loss, night sweats,  Fevers, chills, fatigue, lassitude. HEENT:   No headaches,  Difficulty swallowing,  Tooth/dental problems,  Sore throat,                No sneezing, itching, or ear ache,   nasal congestion, post nasal drip, CV:  + chest pain, orthopnea, PND, swelling in lower extremities, anasarca, dizziness, palpitations GI  No heartburn, indigestion, abdominal pain, nausea, vomiting,  Resp: No shortness of breath with exertion or at rest.  No excess mucus, no productive cough,                                  +-non-productive cough,  No coughing up of blood.  No change in color of mucus.  +infrequent wheezing.   Skin: Clear GU:  MS:  No joint pain or swelling.   Psych:  No change in mood or affect. No depression or anxiety.  No memory  loss.   Objective:   Physical Exam General- Alert, Oriented, Affect-appropriate, Distress- none acute; pleasant  Skin- + mild tinea R axilla Lymphadenopathy- none Head- atraumatic            Eyes- Gross vision intact, PERRLA, conjunctivae clear secretions,            Ears- Normal  hearing            Nose- clear, no-Septal dev, mucus, polyps, erosion, perforation .                        Throat- Malampatti III.   TMs not  retracted , mucosa not red , drainage- none,                     tonsils- atrophic;   Neck- flexible , trachea midline, no stridor , thyroid nl, carotid no bruit Chest - symmetrical excursion , unlabored           Heart/CV- RRR , no murmur , no gallop  , no rub, nl s1 s2                           - JVD- none , edema- none, stasis changes- none, varices- none           Lung-  wheeze or rhonchi,-none, cough+ mild dry , dullness-none, rub- none           Chest wall-  Abd-  Br/ Gen/ Rectal- Not done, not indicated Extrem- cyanosis- none, clubbing, none, atrophy- none, strength- nl.  Neuro- grossly intact to observation

## 2019-08-31 DIAGNOSIS — H40003 Preglaucoma, unspecified, bilateral: Secondary | ICD-10-CM | POA: Diagnosis not present

## 2019-09-22 ENCOUNTER — Telehealth: Payer: Self-pay | Admitting: Internal Medicine

## 2019-09-22 NOTE — Telephone Encounter (Signed)
Sulfa allergy is not a contraindication for the covid vaccine. She could pretreat with a non-sedating antihistamine like Claritin or Allegra, taken 1 hour before her covid vaccine.

## 2019-09-22 NOTE — Telephone Encounter (Signed)
Spoke with the pt  She states that she is allergic to sulfa and wants to know if she could get the covid vaccine bc of this  She is unsure of her reaction to sulfa    I advised will ask Dr Annamaria Boots about this  I advised that what he may suggest is benadryl for prophylactic use and then wait 30 min after the vaccine is administered  She states that she will not be able to take benadryl bc it makes her too sleepy and she would not be able to drive  Please advise, thanks!

## 2019-09-23 ENCOUNTER — Telehealth: Payer: Self-pay | Admitting: Family Medicine

## 2019-09-23 NOTE — Telephone Encounter (Signed)
Called pt and advised message from the provider. Pt understood and verbalized understanding. Nothing further is needed.    

## 2019-09-23 NOTE — Telephone Encounter (Addendum)
It looks like she should be able to get the Covid vaccine.  If she has been able to take the flu, pneumonia, tetanus, shingles shot without trouble, and if she has no history of anaphylactic reaction, then I would expect her to be able to tolerate this and it makes sense to proceed.  Thanks.

## 2019-09-23 NOTE — Telephone Encounter (Signed)
Pt called wanting know if it was ok for her to get covid vaccine with medication she is allergic to and her medical history.  Pt didn't have list of meds that she is allergic to she stated this is in her chart  Please advise

## 2019-09-23 NOTE — Telephone Encounter (Signed)
Patient notified as instructed by telephone and verbalized understanding. 

## 2019-09-24 DIAGNOSIS — H01003 Unspecified blepharitis right eye, unspecified eyelid: Secondary | ICD-10-CM | POA: Diagnosis not present

## 2019-10-05 ENCOUNTER — Telehealth: Payer: Self-pay | Admitting: Internal Medicine

## 2019-10-05 MED ORDER — AMOXICILLIN-POT CLAVULANATE 875-125 MG PO TABS: 1.0000 | ORAL_TABLET | Freq: Two times a day (BID) | ORAL | 0 refills | Status: DC

## 2019-10-05 MED ORDER — PREDNISONE 10 MG PO TABS: ORAL_TABLET | ORAL | 0 refills | Status: DC

## 2019-10-05 NOTE — Telephone Encounter (Signed)
Spoke with patient. She is aware of CY's responses. Will call in [facility] to [facility] [place] on [address].   Nothing further needed at time of call.

## 2019-10-05 NOTE — Telephone Encounter (Signed)
Offer augmentin 875 mg, # 14, 1 twice daily           Prednisone 10 mg, 4 X 2 DAYS, 3 X 2 DAYS, 2 X 2 DAYS, 1 X 2 DAYS

## 2019-10-05 NOTE — Telephone Encounter (Signed)
Spoke with patient. She stated that she was seen by Dr. Annamaria Boots on 08/27/19 for her cough. At the time of her visit, her cough was not bad. Now she has a productive cough with thick, white phlegm. Increased chest congestion. She denied any fevers or body aches. She was prescribed a zpak but it didn't help.   She wants to know if she could have something called in for her. [facility] [place] on [address].   Allergies  Allergen Reactions  . Cefuroxime Axetil     REACTION: pt not sure of reaction- she can tolerate amoxil  . Doxycycline Itching  . Latex Itching  . Lovastatin Other (See Comments)    Muscle aches   . Sulfonamide Derivatives     REACTION: pt not sure of reaction   Dr. Annamaria Boots, please advise. Thanks!

## 2019-10-21 ENCOUNTER — Other Ambulatory Visit: Payer: Self-pay | Admitting: Family Medicine

## 2019-10-21 DIAGNOSIS — Z1231 Encounter for screening mammogram for malignant neoplasm of breast: Secondary | ICD-10-CM

## 2019-11-19 ENCOUNTER — Telehealth: Payer: Self-pay | Admitting: Internal Medicine

## 2019-11-19 NOTE — Telephone Encounter (Signed)
Called the patient and confirmed the symptoms started after she received the vaccination two days ago. The patient confirmed the rescue inhaler has been working for her.   I advised her immune system is doing what is supposed to and it has been triggered because of the covid vaccination. Advised that if she develops increases shortness of breath/wheezing that does not resolve with use if the inhaler and if she develops chest pains to have someone take her to the ER.  Patient voiced understanding nothing further needed at this time.

## 2019-11-20 ENCOUNTER — Other Ambulatory Visit: Payer: Self-pay

## 2019-11-20 ENCOUNTER — Ambulatory Visit: Payer: Medicare Other | Admitting: Family Medicine

## 2019-11-30 ENCOUNTER — Ambulatory Visit: Payer: Medicare Other

## 2019-12-21 ENCOUNTER — Ambulatory Visit (INDEPENDENT_AMBULATORY_CARE_PROVIDER_SITE_OTHER): Payer: Medicare Other | Admitting: Family Medicine

## 2019-12-21 ENCOUNTER — Other Ambulatory Visit: Payer: Self-pay

## 2019-12-21 ENCOUNTER — Ambulatory Visit
Admission: RE | Admit: 2019-12-21 | Discharge: 2019-12-21 | Disposition: A | Discharge status: Home / Self Care | Payer: Medicare Other | Source: Ambulatory Visit | Attending: Family Medicine | Admitting: Family Medicine

## 2019-12-21 ENCOUNTER — Ambulatory Visit (INDEPENDENT_AMBULATORY_CARE_PROVIDER_SITE_OTHER)
Admission: RE | Admit: 2019-12-21 | Discharge: 2019-12-21 | Disposition: A | Discharge status: Home / Self Care | Payer: Medicare Other | Source: Ambulatory Visit | Attending: Family Medicine | Admitting: Family Medicine

## 2019-12-21 ENCOUNTER — Encounter: Payer: Self-pay | Admitting: Family Medicine

## 2019-12-21 VITALS — BP 168/64 | HR 66 | Temp 97.1°F | Ht 62.0 in | Wt 133.1 lb

## 2019-12-21 DIAGNOSIS — M25519 Pain in unspecified shoulder: Secondary | ICD-10-CM

## 2019-12-21 DIAGNOSIS — M791 Myalgia, unspecified site: Secondary | ICD-10-CM | POA: Diagnosis not present

## 2019-12-21 DIAGNOSIS — M25511 Pain in right shoulder: Secondary | ICD-10-CM | POA: Diagnosis not present

## 2019-12-21 DIAGNOSIS — M19012 Primary osteoarthritis, left shoulder: Secondary | ICD-10-CM | POA: Diagnosis not present

## 2019-12-21 LAB — SEDIMENTATION RATE: Sed Rate: 17 mm/hr (ref 0–30)

## 2019-12-21 LAB — CK: Total CK: 177 U/L (ref 7–177)

## 2019-12-21 LAB — TSH: TSH: 3 u[IU]/mL (ref 0.35–4.50)

## 2019-12-21 NOTE — Progress Notes (Signed)
This visit occurred during the SARS-CoV-2 public health emergency.  Safety protocols were in place, including screening questions prior to the visit, additional usage of staff PPE, and extensive cleaning of exam room while observing appropriate contact time as indicated for disinfecting solutions.  L leg sore.  She has had some BLE edema that was uncomfortable. She had some discoloration on the L shin prev, but not on the calf.  Not on a statin currently.  She has B upper arm pain.  Arm pain started in the last few months.  Leg pain is more recently.  No thigh pain.  She has some lower back pain occ- massage and heat help with that.  Leg pain isn't worse walking but is worse laying down.    She had her covid vaccine, d/w pt.    She continue to have occ balance troubles on standing, after standing, she is going to monitor and see how long it lasts.  It was unclear to patient if she has symptoms that are very brief right after she stands or if they persist after she rises.  She hasn't fallen.  She thought her BP elevation was related to pain/discomfort today.    Unclear how much of her aches are related to fibromyalgia, d/w pt.  She has some fatigue.  Meds, vitals, and allergies reviewed.   ROS: Per HPI unless specifically indicated in ROS section   GEN: nad, alert and oriented HEENT: ncat NECK: supple w/o LA CV: rrr. PULM: ctab, no inc wob ABD: soft, +bs EXT: no edema SKIN: no acute rash but hypopigmented area on R shin, barely visible.   B shoulder pain with int rotation.   Intact and normal DP pulses and B calf not ttp.

## 2019-12-21 NOTE — Patient Instructions (Addendum)
Go to the lab on the way out.   If you have mychart we'll likely use that to update you.    Don't change your meds for now.  We'll be in touch.  Take care.  Glad to see you. 

## 2019-12-22 DIAGNOSIS — K219 Gastro-esophageal reflux disease without esophagitis: Secondary | ICD-10-CM | POA: Diagnosis not present

## 2019-12-22 DIAGNOSIS — Z86010 Personal history of colonic polyps: Secondary | ICD-10-CM | POA: Diagnosis not present

## 2019-12-24 DIAGNOSIS — M25519 Pain in unspecified shoulder: Secondary | ICD-10-CM | POA: Insufficient documentation

## 2019-12-24 DIAGNOSIS — M791 Myalgia, unspecified site: Secondary | ICD-10-CM | POA: Insufficient documentation

## 2019-12-24 NOTE — Progress Notes (Signed)
I did review your progress note and the labs.  Patient CK and sed rate is normal.  She is having generalized pain.  It seems she is having a flare of fibromyalgia.  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  If the pain is just localized to the shoulder joint we can do a cortisone injection.  Please let me know if you need to schedule her for a shoulder joint. Thank you

## 2019-12-24 NOTE — Progress Notes (Signed)
We will schedule an appointment to inject her bilateral shoulders. Thank you

## 2019-12-24 NOTE — Assessment & Plan Note (Signed)
This could be multifactorial.  She has pain with internal rotation, more than external rotation.  This is symmetric and bilateral.  She does not have an arm drop.  Reasonable to check plain films today and go from there.  See notes on imaging.

## 2019-12-24 NOTE — Assessment & Plan Note (Signed)
Along with some fatigue.  Not on a statin currently.  Unclear if this is related to fibromyalgia.  She does not have a history of PMR.  Reasonable to check routine labs today.  See notes on labs and we will go from there.  She agrees with plan.  At least 30 minutes were devoted to patient care in this encounter (this can potentially include time spent reviewing the patient's file/history, interviewing and examining the patient, counseling/reviewing plan with patient, ordering referrals, ordering tests, reviewing relevant laboratory or x-ray data, and documenting the encounter).

## 2019-12-24 NOTE — Progress Notes (Signed)
Office Visit Note  Patient: Samantha [name]             Date of Birth: 01-21-1946           MRN: [id number]             PCP: Tonia Ghent, MD Referring: Tonia Ghent, MD Visit Date: 12/30/2019 Occupation: @GUAROCC @  Subjective:  Other (patient complains of both feet itching and bilateral shoulder pain )   History of Present Illness: Samantha [name] is a 74 y.o. female with history of osteoarthritis and fibromyalgia.  She states she continues to have discomfort in her shoulders which she describes over the deltoid muscle.  She states she also has discomfort in her upper back.  She has discomfort on her shoulders and her sides when she lays down at nighttime.  She had x-rays done by Dr. Damita Dunnings which showed arthritis in her shoulder joints.  Activities of Daily Living:  Patient reports morning stiffness for several minutes.   Patient Reports nocturnal pain.  Difficulty dressing/grooming: Denies Difficulty climbing stairs: Denies Difficulty getting out of chair: Reports Difficulty using hands for taps, buttons, cutlery, and/or writing: Reports  Review of Systems  Constitutional: Positive for fatigue. Negative for night sweats, weight gain and weight loss.  HENT: Negative for mouth sores, trouble swallowing, trouble swallowing, mouth dryness and nose dryness.   Eyes: Negative for pain, redness, itching, visual disturbance and dryness.  Respiratory: Negative for cough, shortness of breath and difficulty breathing.   Cardiovascular: Negative for chest pain, palpitations, hypertension, irregular heartbeat and swelling in legs/feet.  Gastrointestinal: Negative for blood in stool, constipation and diarrhea.  Endocrine: Negative for increased urination.  Genitourinary: Negative for difficulty urinating and vaginal dryness.  Musculoskeletal: Positive for arthralgias, joint pain, myalgias, morning stiffness, muscle tenderness and myalgias. Negative for joint swelling and muscle  weakness.  Skin: Negative for color change, rash, hair loss, redness, skin tightness, ulcers and sensitivity to sunlight.  Allergic/Immunologic: Negative for susceptible to infections.  Neurological: Positive for numbness. Negative for dizziness, light-headedness, headaches, memory loss, night sweats and weakness.  Hematological: Negative for bruising/bleeding tendency and swollen glands.  Psychiatric/Behavioral: Negative for depressed mood, confusion and sleep disturbance. The patient is not nervous/anxious.     PMFS History:  Patient Active Problem List   Diagnosis Date Noted  . Shoulder pain 12/24/2019  . Myalgia 12/24/2019  . Bronchiectasis without complication ([place]) [id number]  . Dysphagia 07/23/2019  . Abdominal pain 07/23/2019  . Nonintractable headache 03/19/2019  . Paresthesia 03/19/2019  . Decreased grip strength 02/04/2019  . Clavicle pain 02/04/2019  . Itching 02/02/2019  . Local superficial swelling 02/02/2019  . Weight loss 02/02/2019  . Osteopenia 08/09/2018  . Chronic rhinitis 07/23/2018  . 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 Healthcare maintenance 07/16/2018  . Moderate persistent asthma with acute exacerbation 01/30/2018  . Advance care planning 01/02/2018  . Vitamin D deficiency 07/08/2017  . 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/19/2017  . Primary osteoarthritis of both knees 01/19/2017  . Primary osteoarthritis of both feet 01/19/2017  . Cough 11/14/2016  . SOB (shortness of breath) 11/14/2016  . Diverticulosis 03/19/2016  . Rash 10/03/2015  . Lumbar compression fracture ([place]) 06/20/2015  . Fibromyalgia 02/26/2013  . Allergic rhinitis due to pollen 11/20/2010  . 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 IRRITABLE BOWEL SYNDROME 04/06/2010  . THYROID NODULE, RIGHT 12/07/2009  . OSTEOARTHRITIS 11/28/2009  . FIBROIDS, UTERUS 05/10/2008  . Insomnia 05/10/2008  .  ASYMPTOMATIC POSTMENOPAUSAL STATUS 05/10/2008  .  Essential hypertension 01/28/2008  . HYPERCHOLESTEROLEMIA 01/07/2008  . GLAUCOMA 01/07/2008  . Gastroesophageal reflux disease 04/14/2007  . Asthma 04/14/2007    Past Medical History:  Diagnosis Date  . ALLERGIC RHINITIS 07/26/2008  . ASTHMA 04/14/2007  . Asthma   . ASYMPTOMATIC POSTMENOPAUSAL STATUS 05/10/2008  . Bronchiectasis ([place])   . CHEST PAIN 08/26/2008  . COLONIC POLYPS, HX OF 04/14/2007   colonic leiomyoma  . Decreased grip strength   . Episcleritis   . FIBROIDS, UTERUS 05/10/2008  . Fibromyalgia   . Gait abnormality   . GERD 04/14/2007  . HYPERCHOLESTEROLEMIA 01/07/2008  . HYPERGLYCEMIA 11/28/2009  . HYPERTENSION 01/28/2008  . INSOMNIA 05/10/2008  . Irritable bowel syndrome 04/06/2010  . OSTEOARTHRITIS 11/28/2009  . 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 SINUSITIS 02/10/2009    Family History  Problem Relation Age of Onset  . Diabetes Mother   . Hypertension Mother   . Glaucoma Mother   . Polymyositis Mother   . Heart disease Father        CHF  . Allergic rhinitis Father   . Asthma Father   . Other Father        sideoblastic anemia  . Allergic rhinitis Sister   . Asthma Sister   . Allergic rhinitis Brother   . Asthma Brother   . Prostate cancer Brother   . Allergic rhinitis Maternal Aunt   . Asthma Maternal Aunt   . Allergic rhinitis Paternal Aunt   . Asthma Paternal Aunt   . Breast cancer Cousin   . Colon cancer Cousin   . Cancer Neg Hx        No FH of Colon Cancer  . Angioedema Neg Hx   . Atopy Neg Hx   . Immunodeficiency Neg Hx   . Urticaria Neg Hx   . Eczema Neg Hx    Past Surgical History:  Procedure Laterality Date  . COLONOSCOPY W/ POLYPECTOMY    . ELECTROCARDIOGRAM  12/04/2006  . IR RADIOLOGY PERIPHERAL GUIDED IV START  04/21/2018  . IR US GUIDE VASC ACCESS LEFT  04/21/2018  . NASAL SEPTUM SURGERY    . Stress Cardiolite  02/13/2002   Social History   Social History Narrative   Lives alone (widowed 1998).   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      Does not have living will.  Does desire CPR.  Does not want life support for prolonged periods of time if futile.   2 sons and 1 daughter.        Right-handed.   No daily caffeine use.   Immunization History  Administered Date(s) Administered  . Fluad Quad(high Dose 65+) 05/07/2019  . Influenza Split 06/04/2011, 05/27/2012  . Influenza Whole 08/18/2008, 05/26/2010  . Influenza, High Dose Seasonal PF 05/22/2017, 06/11/2018  . Influenza,inj,Quad PF,6+ Mos 06/08/2013, 05/19/2014, 06/08/2015, 05/07/2016  . PFIZER SARS-COV-2 Vaccination 10/27/2019, 11/17/2019  . Pneumococcal Conjugate-13 03/17/2014  . Pneumococcal Polysaccharide-23 11/23/2009, 06/08/2015, 07/14/2018  . Td 11/09/2002  . Tdap 07/03/2013  . Zoster 06/17/2013  . Zoster Recombinat (Shingrix) 02/19/2019, 08/27/2019     Objective: Vital Signs: BP 139/78 (BP Location: Left Arm, Patient Position: Sitting, Cuff Size: Normal)   Pulse 94   Resp 16   Ht 5' 1.7" (1.567 m)   Wt 133 lb 6.4 oz (60.5 kg)   BMI 24.64 kg/m    Physical Exam Vitals and nursing note reviewed.  Constitutional:      Appearance: She  is well-developed.  HENT:     Head: Normocephalic and atraumatic.  Eyes:     Conjunctiva/sclera: Conjunctivae normal.  Cardiovascular:     Rate and Rhythm: Normal rate and regular rhythm.     Heart sounds: Normal heart sounds.  Pulmonary:     Effort: Pulmonary effort is normal.     Breath sounds: Normal breath sounds.  Abdominal:     General: Bowel sounds are normal.     Palpations: Abdomen is soft.  Musculoskeletal:     Cervical back: Normal range of motion.  Lymphadenopathy:     Cervical: No cervical adenopathy.  Skin:    General: Skin is warm and dry.     Capillary Refill: Capillary refill takes less than 2 seconds.  Neurological:     Mental Status: She is alert and oriented to person, place, and time.  Psychiatric:        Behavior: Behavior normal.      Musculoskeletal Exam: She has good  range of motion of her cervical spine.  She has painful range of motion of bilateral shoulder joints.  She also had tenderness at the insertion of the deltoid muscle.  Elbow joints wrist joints MCPs with good range of motion.  She has bilateral PIP and DIP thickening.  Hip joints and knee joints in good range of motion with no synovitis.  She has some discomfort range of motion of her hip joints.  She has positive tender points and hyperalgesia.  CDAI Exam: CDAI Score: -- Patient Global: --; Provider Global: -- Swollen: --; Tender: -- Joint Exam 12/30/2019   No joint exam has been documented for this visit   There is currently no information documented on the homunculus. Go to the Rheumatology activity and complete the homunculus joint exam.  Investigation: No additional findings.  Imaging: DG Shoulder Right  Result Date: 12/21/2019 CLINICAL DATA:  Right shoulder pain EXAM: RIGHT SHOULDER - 2+ VIEW COMPARISON:  02/03/2019 FINDINGS: Advanced degenerative changes in the right [facility] joint. Glenohumeral joint is maintained. No acute bony abnormality. Specifically, no fracture, subluxation, or dislocation. IMPRESSION: Advanced AC joint degenerative changes.  No acute bony abnormality. Electronically Signed   By: Rolm Baptise M.D.   On: 12/21/2019 10:53   DG Shoulder Left  Result Date: 12/21/2019 CLINICAL DATA:  Left shoulder pain EXAM: LEFT SHOULDER - 2+ VIEW COMPARISON:  None. FINDINGS: Advanced degenerative changes in the left [facility] joint. Glenohumeral joint is maintained. No acute bony abnormality. Specifically, no fracture, subluxation, or dislocation. IMPRESSION: Advanced degenerative changes in the left AC joint. No acute bony abnormality. Electronically Signed   By: Rolm Baptise M.D.   On: 12/21/2019 11:21    Recent Labs: Lab Results  Component Value Date   WBC 6.4 03/10/2019   HGB 13.7 03/10/2019   PLT 212 03/10/2019   NA 139 07/14/2019   K 4.2 07/14/2019   CL 102 07/14/2019   CO2 31  07/14/2019   GLUCOSE 91 07/14/2019   BUN 14 07/14/2019   CREATININE 0.85 07/14/2019   BILITOT 0.5 07/14/2019   ALKPHOS 58 07/14/2019   AST 17 07/14/2019   ALT 9 07/14/2019   PROT 7.0 07/14/2019   ALBUMIN 4.5 07/14/2019   CALCIUM 9.9 07/14/2019   GFRAA >60 03/10/2019    Speciality Comments: No specialty comments available.  Procedures:  Large Joint Inj: bilateral subacromial bursa on 12/30/2019 2:23 PM Indications: pain Details: 27 G 1.5 in needle, posterior approach  Arthrogram: No  Medications (Right): 1 mL lidocaine 1 %;  40 mg triamcinolone acetonide 40 MG/ML Medications (Left): 1 mL lidocaine 1 %; 40 mg triamcinolone acetonide 40 MG/ML Outcome: tolerated well, no immediate complications Procedure, treatment alternatives, risks and benefits explained, specific risks discussed. Consent was given by the patient. Immediately prior to procedure a time out was called to verify the correct patient, procedure, equipment, support staff and site/side marked as required. Patient was prepped and draped in the usual sterile fashion.     Allergies: Cefuroxime axetil, Doxycycline, Latex, Lovastatin, and Sulfonamide derivatives   Assessment / Plan:     Visit Diagnoses: Primary osteoarthritis of both hands-she has ongoing discomfort.  Joint protection muscle strengthening was discussed.  Bilateral carpal tunnel syndrome-symptoms have improved.  Primary osteoarthritis of both shoulders - Bilateral acromioclavicular arthritis.  I reviewed her x-rays.  After treatment options were discussed we decided to give cortisone injections bilaterally.  She tolerated the procedure well.  Primary osteoarthritis of both hips-she has some stiffness with range of motion of her hip joints.  Primary osteoarthritis of both knees-she is currently not having much discomfort.  Primary osteoarthritis of both feet-proper fitting shoes were discussed.  Fibromyalgia-she has generalized pain hyperalgesia and  positive tender points.  Other fatigue-related to fibromyalgia and insomnia.  Other insomnia-sleep hygiene was discussed.  Pain in thoracic spine-she has no point tenderness.  Have given her a handout on back exercises.  I also offered physical therapy.  Patient is hesitant due to the pandemic.  History of vertebral compression fracture - 08/04/18 DXA T-1.1  History of vitamin D deficiency  History of diverticulosis  History of gastroesophageal reflux (GERD)  History of IBS  History of hyperlipidemia  History of hypertension  History of asthma  History of glaucoma  History of kidney stones  Orders: Orders Placed This Encounter  Procedures  . Large Joint Inj   No orders of the defined types were placed in this encounter.   Face-to-face time spent with patient was 30 minutes. Greater than 50% of time was spent in counseling and coordination of care.  Follow-Up Instructions: Return in about 6 months (around 06/30/2020) for Osteoarthritis,FMS.   Bo Merino, MD  Note - This record has been created using Editor, commissioning.  Chart creation errors have been sought, but may not always  have been located. Such creation errors do not reflect on  the standard of medical care.

## 2019-12-29 ENCOUNTER — Ambulatory Visit: Payer: Medicare Other

## 2019-12-30 ENCOUNTER — Ambulatory Visit
Admission: RE | Admit: 2019-12-30 | Discharge: 2019-12-30 | Disposition: A | Discharge status: Home / Self Care | Payer: Medicare Other | Source: Ambulatory Visit | Attending: Family Medicine | Admitting: Family Medicine

## 2019-12-30 ENCOUNTER — Other Ambulatory Visit: Payer: Self-pay

## 2019-12-30 ENCOUNTER — Ambulatory Visit (INDEPENDENT_AMBULATORY_CARE_PROVIDER_SITE_OTHER): Payer: Medicare Other | Admitting: Rheumatology

## 2019-12-30 ENCOUNTER — Encounter: Payer: Self-pay | Admitting: Physician Assistant

## 2019-12-30 VITALS — BP 139/78 | HR 94 | Resp 16 | Ht 61.7 in | Wt 133.4 lb

## 2019-12-30 DIAGNOSIS — M19011 Primary osteoarthritis, right shoulder: Secondary | ICD-10-CM

## 2019-12-30 DIAGNOSIS — M160 Bilateral primary osteoarthritis of hip: Secondary | ICD-10-CM

## 2019-12-30 DIAGNOSIS — Z1231 Encounter for screening mammogram for malignant neoplasm of breast: Secondary | ICD-10-CM

## 2019-12-30 DIAGNOSIS — M546 Pain in thoracic spine: Secondary | ICD-10-CM

## 2019-12-30 DIAGNOSIS — M19071 Primary osteoarthritis, right ankle and foot: Secondary | ICD-10-CM

## 2019-12-30 DIAGNOSIS — M19012 Primary osteoarthritis, left shoulder: Secondary | ICD-10-CM

## 2019-12-30 DIAGNOSIS — G5603 Carpal tunnel syndrome, bilateral upper limbs: Secondary | ICD-10-CM | POA: Diagnosis not present

## 2019-12-30 DIAGNOSIS — Z8781 Personal history of (healed) traumatic fracture: Secondary | ICD-10-CM

## 2019-12-30 DIAGNOSIS — R5383 Other fatigue: Secondary | ICD-10-CM

## 2019-12-30 DIAGNOSIS — M19072 Primary osteoarthritis, left ankle and foot: Secondary | ICD-10-CM

## 2019-12-30 DIAGNOSIS — M19041 Primary osteoarthritis, right hand: Secondary | ICD-10-CM

## 2019-12-30 DIAGNOSIS — M797 Fibromyalgia: Secondary | ICD-10-CM

## 2019-12-30 DIAGNOSIS — I7300 Raynaud's syndrome without gangrene: Secondary | ICD-10-CM

## 2019-12-30 DIAGNOSIS — G4709 Other insomnia: Secondary | ICD-10-CM

## 2019-12-30 DIAGNOSIS — Z8669 Personal history of other diseases of the nervous system and sense organs: Secondary | ICD-10-CM

## 2019-12-30 DIAGNOSIS — Z8679 Personal history of other diseases of the circulatory system: Secondary | ICD-10-CM

## 2019-12-30 DIAGNOSIS — Z87442 Personal history of urinary calculi: Secondary | ICD-10-CM

## 2019-12-30 DIAGNOSIS — Z8709 Personal history of other diseases of the respiratory system: Secondary | ICD-10-CM

## 2019-12-30 DIAGNOSIS — M170 Bilateral primary osteoarthritis of knee: Secondary | ICD-10-CM

## 2019-12-30 DIAGNOSIS — Z8719 Personal history of other diseases of the digestive system: Secondary | ICD-10-CM

## 2019-12-30 DIAGNOSIS — Z8639 Personal history of other endocrine, nutritional and metabolic disease: Secondary | ICD-10-CM

## 2019-12-30 DIAGNOSIS — M19042 Primary osteoarthritis, left hand: Secondary | ICD-10-CM

## 2019-12-30 NOTE — Patient Instructions (Addendum)
Back Exercises The following exercises strengthen the muscles that help to support the trunk and back. They also help to keep the lower back flexible. Doing these exercises can help to prevent back pain or lessen existing pain.  If you have back pain or discomfort, try doing these exercises 2-3 times each day or as told by your health care provider.  As your pain improves, do them once each day, but increase the number of times that you repeat the steps for each exercise (do more repetitions).  To prevent the recurrence of back pain, continue to do these exercises once each day or as told by your health care provider. Do exercises exactly as told by your health care provider and adjust them as directed. It is normal to feel mild stretching, pulling, tightness, or discomfort as you do these exercises, but you should stop right away if you feel sudden pain or your pain gets worse. Exercises Single knee to chest Repeat these steps 3-5 times for each leg: 1. Lie on your back on a firm bed or the floor with your legs extended. 2. Bring one knee to your chest. Your other leg should stay extended and in contact with the floor. 3. Hold your knee in place by grabbing your knee or thigh with both hands and hold. 4. Pull on your knee until you feel a gentle stretch in your lower back or buttocks. 5. Hold the stretch for 10-30 seconds. 6. Slowly release and straighten your leg. Pelvic tilt Repeat these steps 5-10 times: 1. Lie on your back on a firm bed or the floor with your legs extended. 2. Bend your knees so they are pointing toward the ceiling and your feet are flat on the floor. 3. Tighten your lower abdominal muscles to press your lower back against the floor. This motion will tilt your pelvis so your tailbone points up toward the ceiling instead of pointing to your feet or the floor. 4. With gentle tension and even breathing, hold this position for 5-10 seconds. Cat-cow Repeat these steps until  your lower back becomes more flexible: 1. Get into a hands-and-knees position on a firm surface. Keep your hands under your shoulders, and keep your knees under your hips. You may place padding under your knees for comfort. 2. Let your head hang down toward your chest. Contract your abdominal muscles and point your tailbone toward the floor so your lower back becomes rounded like the back of a cat. 3. Hold this position for 5 seconds. 4. Slowly lift your head, let your abdominal muscles relax and point your tailbone up toward the ceiling so your back forms a sagging arch like the back of a cow. 5. Hold this position for 5 seconds.  Press-ups Repeat these steps 5-10 times: 1. Lie on your abdomen (face-down) on the floor. 2. Place your palms near your head, about shoulder-width apart. 3. Keeping your back as relaxed as possible and keeping your hips on the floor, slowly straighten your arms to raise the top half of your body and lift your shoulders. Do not use your back muscles to raise your upper torso. You may adjust the placement of your hands to make yourself more comfortable. 4. Hold this position for 5 seconds while you keep your back relaxed. 5. Slowly return to lying flat on the floor.  Bridges Repeat these steps 10 times: 1. Lie on your back on a firm surface. 2. Bend your knees so they are pointing toward the ceiling and   your feet are flat on the floor. Your arms should be flat at your sides, next to your body. 3. Tighten your buttocks muscles and lift your buttocks off the floor until your waist is at almost the same height as your knees. You should feel the muscles working in your buttocks and the back of your thighs. If you do not feel these muscles, slide your feet 1-2 inches farther away from your buttocks. 4. Hold this position for 3-5 seconds. 5. Slowly lower your hips to the starting position, and allow your buttocks muscles to relax completely. If this exercise is too easy, try  doing it with your arms crossed over your chest. Abdominal crunches Repeat these steps 5-10 times: 1. Lie on your back on a firm bed or the floor with your legs extended. 2. Bend your knees so they are pointing toward the ceiling and your feet are flat on the floor. 3. Cross your arms over your chest. 4. Tip your chin slightly toward your chest without bending your neck. 5. Tighten your abdominal muscles and slowly raise your trunk (torso) high enough to lift your shoulder blades a tiny bit off the floor. Avoid raising your torso higher than that because it can put too much stress on your low back and does not help to strengthen your abdominal muscles. 6. Slowly return to your starting position. Back lifts Repeat these steps 5-10 times: 1. Lie on your abdomen (face-down) with your arms at your sides, and rest your forehead on the floor. 2. Tighten the muscles in your legs and your buttocks. 3. Slowly lift your chest off the floor while you keep your hips pressed to the floor. Keep the back of your head in line with the curve in your back. Your eyes should be looking at the floor. 4. Hold this position for 3-5 seconds. 5. Slowly return to your starting position. Contact a health care provider if:  Your back pain or discomfort gets much worse when you do an exercise.  Your worsening back pain or discomfort does not lessen within 2 hours after you exercise. If you have any of these problems, stop doing these exercises right away. Do not do them again unless your health care provider says that you can. Get help right away if:  You develop sudden, severe back pain. If this happens, stop doing the exercises right away. Do not do them again unless your health care provider says that you can. This information is not intended to replace advice given to you by your health care provider. Make sure you discuss any questions you have with your health care provider. Document Revised: 01/01/2019 Document  Reviewed: 05/29/2018 Elsevier Patient Education  [place].  Shoulder Exercises Ask your health care provider which exercises are safe for you. Do exercises exactly as told by your health care provider and adjust them as directed. It is normal to feel mild stretching, pulling, tightness, or discomfort as you do these exercises. Stop right away if you feel sudden pain or your pain gets worse. Do not begin these exercises until told by your health care provider. Stretching exercises External rotation and abduction This exercise is sometimes called corner stretch. This exercise rotates your arm outward (external rotation) and moves your arm out from your body (abduction). 1. Stand in a doorway with one of your feet slightly in front of the other. This is called a staggered stance. If you cannot reach your forearms to the door frame, stand facing a  corner of a room. 2. Choose one of the following positions as told by your health care provider: ? Place your hands and forearms on the door frame above your head. ? Place your hands and forearms on the door frame at the height of your head. ? Place your hands on the door frame at the height of your elbows. 3. Slowly move your weight onto your front foot until you feel a stretch across your chest and in the front of your shoulders. Keep your head and chest upright and keep your abdominal muscles tight. 4. Hold for __________ seconds. 5. To release the stretch, shift your weight to your back foot. Repeat __________ times. Complete this exercise __________ times a day. Extension, standing 1. Stand and hold a broomstick, a cane, or a similar object behind your back. ? Your hands should be a little wider than shoulder width apart. ? Your palms should face away from your back. 2. Keeping your elbows straight and your shoulder muscles relaxed, move the stick away from your body until you feel a stretch in your shoulders (extension). ? Avoid shrugging  your shoulders while you move the stick. Keep your shoulder blades tucked down toward the middle of your back. 3. Hold for __________ seconds. 4. Slowly return to the starting position. Repeat __________ times. Complete this exercise __________ times a day. Range-of-motion exercises Pendulum  1. Stand near a wall or a surface that you can hold onto for balance. 2. Bend at the waist and let your left / right arm hang straight down. Use your other arm to support you. Keep your back straight and do not lock your knees. 3. Relax your left / right arm and shoulder muscles, and move your hips and your trunk so your left / right arm swings freely. Your arm should swing because of the motion of your body, not because you are using your arm or shoulder muscles. 4. Keep moving your hips and trunk so your arm swings in the following directions, as told by your health care provider: ? Side to side. ? Forward and backward. ? In clockwise and counterclockwise circles. 5. Continue each motion for __________ seconds, or for as long as told by your health care provider. 6. Slowly return to the starting position. Repeat __________ times. Complete this exercise __________ times a day. Shoulder flexion, standing  1. Stand and hold a broomstick, a cane, or a similar object. Place your hands a little more than shoulder width apart on the object. Your left / right hand should be palm up, and your other hand should be palm down. 2. Keep your elbow straight and your shoulder muscles relaxed. Push the stick up with your healthy arm to raise your left / right arm in front of your body, and then over your head until you feel a stretch in your shoulder (flexion). ? Avoid shrugging your shoulder while you raise your arm. Keep your shoulder blade tucked down toward the middle of your back. 3. Hold for __________ seconds. 4. Slowly return to the starting position. Repeat __________ times. Complete this exercise __________  times a day. Shoulder abduction, standing 1. Stand and hold a broomstick, a cane, or a similar object. Place your hands a little more than shoulder width apart on the object. Your left / right hand should be palm up, and your other hand should be palm down. 2. Keep your elbow straight and your shoulder muscles relaxed. Push the object across your body toward your left /  right side. Raise your left / right arm to the side of your body (abduction) until you feel a stretch in your shoulder. ? Do not raise your arm above shoulder height unless your health care provider tells you to do that. ? If directed, raise your arm over your head. ? Avoid shrugging your shoulder while you raise your arm. Keep your shoulder blade tucked down toward the middle of your back. 3. Hold for __________ seconds. 4. Slowly return to the starting position. Repeat __________ times. Complete this exercise __________ times a day. Internal rotation  1. Place your left / right hand behind your back, palm up. 2. Use your other hand to dangle an exercise band, a towel, or a similar object over your shoulder. Grasp the band with your left / right hand so you are holding on to both ends. 3. Gently pull up on the band until you feel a stretch in the front of your left / right shoulder. The movement of your arm toward the center of your body is called internal rotation. ? Avoid shrugging your shoulder while you raise your arm. Keep your shoulder blade tucked down toward the middle of your back. 4. Hold for __________ seconds. 5. Release the stretch by letting go of the band and lowering your hands. Repeat __________ times. Complete this exercise __________ times a day. Strengthening exercises External rotation  1. Sit in a stable chair without armrests. 2. Secure an exercise band to a stable object at elbow height on your left / right side. 3. Place a soft object, such as a folded towel or a small pillow, between your left /  right upper arm and your body to move your elbow about 4 inches (10 cm) away from your side. 4. Hold the end of the exercise band so it is tight and there is no slack. 5. Keeping your elbow pressed against the soft object, slowly move your forearm out, away from your abdomen (external rotation). Keep your body steady so only your forearm moves. 6. Hold for __________ seconds. 7. Slowly return to the starting position. Repeat __________ times. Complete this exercise __________ times a day. Shoulder abduction  1. Sit in a stable chair without armrests, or stand up. 2. Hold a __________ weight in your left / right hand, or hold an exercise band with both hands. 3. Start with your arms straight down and your left / right palm facing in, toward your body. 4. Slowly lift your left / right hand out to your side (abduction). Do not lift your hand above shoulder height unless your health care provider tells you that this is safe. ? Keep your arms straight. ? Avoid shrugging your shoulder while you do this movement. Keep your shoulder blade tucked down toward the middle of your back. 5. Hold for __________ seconds. 6. Slowly lower your arm, and return to the starting position. Repeat __________ times. Complete this exercise __________ times a day. Shoulder extension 1. Sit in a stable chair without armrests, or stand up. 2. Secure an exercise band to a stable object in front of you so it is at shoulder height. 3. Hold one end of the exercise band in each hand. Your palms should face each other. 4. Straighten your elbows and lift your hands up to shoulder height. 5. Step back, away from the secured end of the exercise band, until the band is tight and there is no slack. 6. Squeeze your shoulder blades together as you pull your hands  down to the sides of your thighs (extension). Stop when your hands are straight down by your sides. Do not let your hands go behind your body. 7. Hold for __________  seconds. 8. Slowly return to the starting position. Repeat __________ times. Complete this exercise __________ times a day. Shoulder row 1. Sit in a stable chair without armrests, or stand up. 2. Secure an exercise band to a stable object in front of you so it is at waist height. 3. Hold one end of the exercise band in each hand. Position your palms so that your thumbs are facing the ceiling (neutral position). 4. Bend each of your elbows to a 90-degree angle (right angle) and keep your upper arms at your sides. 5. Step back until the band is tight and there is no slack. 6. Slowly pull your elbows back behind you. 7. Hold for __________ seconds. 8. Slowly return to the starting position. Repeat __________ times. Complete this exercise __________ times a day. Shoulder press-ups  1. Sit in a stable chair that has armrests. Sit upright, with your feet flat on the floor. 2. Put your hands on the armrests so your elbows are bent and your fingers are pointing forward. Your hands should be about even with the sides of your body. 3. Push down on the armrests and use your arms to lift yourself off the chair. Straighten your elbows and lift yourself up as much as you comfortably can. ? Move your shoulder blades down, and avoid letting your shoulders move up toward your ears. ? Keep your feet on the ground. As you get stronger, your feet should support less of your body weight as you lift yourself up. 4. Hold for __________ seconds. 5. Slowly lower yourself back into the chair. Repeat __________ times. Complete this exercise __________ times a day. Wall push-ups  1. Stand so you are facing a stable wall. Your feet should be about one arm-length away from the wall. 2. Lean forward and place your palms on the wall at shoulder height. 3. Keep your feet flat on the floor as you bend your elbows and lean forward toward the wall. 4. Hold for __________ seconds. 5. Straighten your elbows to push yourself  back to the starting position. Repeat __________ times. Complete this exercise __________ times a day. This information is not intended to replace advice given to you by your health care provider. Make sure you discuss any questions you have with your health care provider. Document Revised: 12/19/2018 Document Reviewed: 09/26/2018 Elsevier Patient Education  [place].

## 2020-01-12 DIAGNOSIS — Z1159 Encounter for screening for other viral diseases: Secondary | ICD-10-CM | POA: Diagnosis not present

## 2020-01-15 DIAGNOSIS — Z86010 Personal history of colonic polyps: Secondary | ICD-10-CM | POA: Diagnosis not present

## 2020-01-15 DIAGNOSIS — K621 Rectal polyp: Secondary | ICD-10-CM | POA: Diagnosis not present

## 2020-01-15 DIAGNOSIS — K648 Other hemorrhoids: Secondary | ICD-10-CM | POA: Diagnosis not present

## 2020-01-15 DIAGNOSIS — Q438 Other specified congenital malformations of intestine: Secondary | ICD-10-CM | POA: Diagnosis not present

## 2020-01-15 DIAGNOSIS — K635 Polyp of colon: Secondary | ICD-10-CM | POA: Diagnosis not present

## 2020-01-15 LAB — HM COLONOSCOPY

## 2020-01-20 DIAGNOSIS — K621 Rectal polyp: Secondary | ICD-10-CM | POA: Diagnosis not present

## 2020-01-20 DIAGNOSIS — K635 Polyp of colon: Secondary | ICD-10-CM | POA: Diagnosis not present

## 2020-02-01 ENCOUNTER — Ambulatory Visit: Payer: Medicare Other | Admitting: Rheumatology

## 2020-02-03 DIAGNOSIS — H2513 Age-related nuclear cataract, bilateral: Secondary | ICD-10-CM | POA: Diagnosis not present

## 2020-02-09 ENCOUNTER — Encounter: Payer: Self-pay | Admitting: Family Medicine

## 2020-02-15 DIAGNOSIS — R309 Painful micturition, unspecified: Secondary | ICD-10-CM | POA: Diagnosis not present

## 2020-02-15 DIAGNOSIS — Z6824 Body mass index (BMI) 24.0-24.9, adult: Secondary | ICD-10-CM | POA: Diagnosis not present

## 2020-02-29 DIAGNOSIS — H40003 Preglaucoma, unspecified, bilateral: Secondary | ICD-10-CM | POA: Diagnosis not present

## 2020-03-04 ENCOUNTER — Ambulatory Visit (INDEPENDENT_AMBULATORY_CARE_PROVIDER_SITE_OTHER): Payer: Medicare Other | Admitting: Family Medicine

## 2020-03-04 ENCOUNTER — Other Ambulatory Visit: Payer: Self-pay

## 2020-03-04 ENCOUNTER — Encounter: Payer: Self-pay | Admitting: Family Medicine

## 2020-03-04 VITALS — BP 142/66 | HR 71 | Temp 97.4°F | Ht 61.7 in | Wt 134.4 lb

## 2020-03-04 DIAGNOSIS — J45909 Unspecified asthma, uncomplicated: Secondary | ICD-10-CM

## 2020-03-04 DIAGNOSIS — I10 Essential (primary) hypertension: Secondary | ICD-10-CM

## 2020-03-04 DIAGNOSIS — M797 Fibromyalgia: Secondary | ICD-10-CM

## 2020-03-04 NOTE — Patient Instructions (Signed)
Don't change your meds for now.  Let me check with pharmacy and we'll be in touch about the losartan.  Take care.  Glad to see you.

## 2020-03-04 NOTE — Progress Notes (Signed)
This visit occurred during the SARS-CoV-2 public health emergency.  Safety protocols were in place, including screening questions prior to the visit, additional usage of staff PPE, and extensive cleaning of exam room while observing appropriate contact time as indicated for disinfecting solutions.  Hypertension:     She was concerned about a possible recall related to losartan.  I am unaware of any recent issues with this medication.  There was a previous recall that affected some lots but this was months to years ago and I do not think this is an ongoing issue.  She occ gets a little lightheaded on standing, mild/brief, with routine cautions d/w pt.    She has been having SOB at baseline with some relief from her inhalers.  We talked about pulmonary follow up but she wasn't at the point of wanting to go now.  She has long standing CP that could be from fibromyalgia vs other causes.  D/w pt.  She is still on baseline inhalers.    Prev CT chest:  1. There is a mild cylindrical bronchiectasis in both lower lobes along with mild bibasilar scarring, but no current airway thickening or additional specific cause for the patient's persistent cough is identified. 2. Aortic Atherosclerosis (ICD10-I70.0). Mild cardiomegaly. Small inferior pericardial effusion. 3. Sludge versus gallstones in the gallbladder. 4. A small lesion of the posteromedial spleen was present in 2018 although no has a faint marginal calcification. Highly likely to be Benign.  Prev CTA showed: IMPRESSION: 1. Coronary calcium score of 0. This was 0 percentile for age and sex matched control. 2. Normal coronary origin with left dominance. 3. No evidence of CAD.  Consider non-cardiac sources of chest pain.  Previous left forehead pain resolved.  She had some improvement in bilateral AC joint pain after injection.  Discussed with patient about rheumatology follow-up if she still having joint pain.  Meds, vitals, and allergies  reviewed.   PMH and SH reviewed  ROS: Per HPI unless specifically indicated in ROS section   GEN: nad, alert and oriented HEENT: ncat, no forehead mass appreciated.  Nontender to palpation. NECK: supple w/o LA CV: rrr. PULM: ctab, no inc wob ABD: soft, +bs EXT: no edema SKIN: no acute rash

## 2020-03-06 NOTE — Assessment & Plan Note (Signed)
Continue as is for now.  If she has worsening symptoms we can refer back to pulmonary.  She declined pulmonary follow-up at this point.

## 2020-03-06 NOTE — Assessment & Plan Note (Signed)
No change in meds at this point.  I am unaware of any issues with losartan at this point.  We will check with pharmacy about this.

## 2020-03-06 NOTE — Assessment & Plan Note (Signed)
And history of joint pain.  Her previous AC joint pain is better after injection per rheumatology.  Discussed with patient about as needed rheumatology follow-up.

## 2020-03-07 DIAGNOSIS — H40003 Preglaucoma, unspecified, bilateral: Secondary | ICD-10-CM | POA: Diagnosis not present

## 2020-03-18 ENCOUNTER — Telehealth: Payer: Self-pay

## 2020-03-18 MED ORDER — LOSARTAN POTASSIUM-HCTZ 100-12.5 MG PO TABS: 1.0000 | ORAL_TABLET | Freq: Every day | ORAL | 3 refills | Status: DC

## 2020-03-18 NOTE — Telephone Encounter (Signed)
Per PCP, pt contacted and informed that there is no pending recall for losartan or losartan hydrochlorothiazide. Pt requested refill of losartan hctz to [facility]. Erx has been sent as requested.

## 2020-04-01 ENCOUNTER — Telehealth: Payer: Self-pay | Admitting: Family Medicine

## 2020-04-01 NOTE — Telephone Encounter (Signed)
Patient called office stating she needs refill for Albuterol 90 Base MCG inhaler,Beclomethasone Dipropionate (Qnasl) 80 MCG, and Montelukastna 10 MG for 90 day supply with 3 refills for all through [facility].

## 2020-04-04 ENCOUNTER — Other Ambulatory Visit: Payer: Self-pay | Admitting: *Deleted

## 2020-04-04 MED ORDER — ALBUTEROL SULFATE HFA 108 (90 BASE) MCG/ACT IN AERS: 2.0000 | INHALATION_SPRAY | RESPIRATORY_TRACT | 3 refills | Status: DC | PRN

## 2020-04-04 MED ORDER — MONTELUKAST SODIUM 10 MG PO TABS: 10.0000 mg | ORAL_TABLET | Freq: Every day | ORAL | 3 refills | Status: DC

## 2020-04-04 MED ORDER — QNASL 80 MCG/ACT NA AERS: 2.0000 | INHALATION_SPRAY | Freq: Every day | NASAL | 3 refills | Status: DC

## 2020-04-04 NOTE — Telephone Encounter (Signed)
Refills sent

## 2020-04-08 ENCOUNTER — Telehealth: Payer: Self-pay | Admitting: Rheumatology

## 2020-04-08 DIAGNOSIS — M19011 Primary osteoarthritis, right shoulder: Secondary | ICD-10-CM

## 2020-04-08 DIAGNOSIS — M797 Fibromyalgia: Secondary | ICD-10-CM

## 2020-04-08 DIAGNOSIS — M19042 Primary osteoarthritis, left hand: Secondary | ICD-10-CM

## 2020-04-08 DIAGNOSIS — M19041 Primary osteoarthritis, right hand: Secondary | ICD-10-CM

## 2020-04-08 DIAGNOSIS — M19012 Primary osteoarthritis, left shoulder: Secondary | ICD-10-CM

## 2020-04-08 NOTE — Telephone Encounter (Signed)
ok 

## 2020-04-08 NOTE — Telephone Encounter (Signed)
Patient advised we are placing referral for physical therapy.

## 2020-04-08 NOTE — Telephone Encounter (Signed)
Patient calling to see if she can get a referral to Physical Therapy to the facility on [facility] in [place]. Patient is having trouble with shoulders, hands, and legs. Patient states doctor mentioned H2O therapy in the past. Please call to advise.

## 2020-04-18 ENCOUNTER — Telehealth: Payer: Self-pay | Admitting: Rheumatology

## 2020-04-18 NOTE — Telephone Encounter (Signed)
Patient wants to know if doctor has a mattress that she would recommend for patient with her diagnosis? Patient going out today to purchase one, and requests a call back as soon as possible.  FYI: Patient could not get a PT appointment until September due to facility availability. But, patient is feeling somewhat better.

## 2020-04-18 NOTE — Telephone Encounter (Signed)
LMOM, Dr. Estanislado Pandy recommends Temperpedic, Serta, etc. Mattress with a Pillow top. I will try to get patient in sooner for PT if patient advises where she would like to go.

## 2020-04-28 ENCOUNTER — Telehealth: Payer: Self-pay

## 2020-04-28 ENCOUNTER — Telehealth: Payer: Self-pay | Admitting: Internal Medicine

## 2020-04-28 NOTE — Telephone Encounter (Signed)
ATC pt, line continued to ring without VM. WCB.  Dr. Annamaria Boots,  Pt is wanting a mattress that will not cause issues with her allergies and is requesting your opinion if you know of a mattress that is best. Thanks.

## 2020-04-28 NOTE — Telephone Encounter (Signed)
Patient would like a recommendation on a mattress that may not flare her allergies up so bad. She says that the memory foam types bother her allergies.

## 2020-04-28 NOTE — Telephone Encounter (Signed)
Suggest she talk to a mattress store and see what they have to say. Suggest she get a "dust mite" cover for her mattress. They are cheap and easy. Mattress stores, but also [employer] and [employer] have them. They can go over her existing mattress.

## 2020-04-28 NOTE — Telephone Encounter (Signed)
Called and spoke to patient and she has been informed of Dr. Julianne Rice recommendation and expressed understanding.

## 2020-04-28 NOTE — Telephone Encounter (Signed)
She can get any mattress but recommend that she gets dust mites covers for both the mattress and spring box. That will help the most.

## 2020-04-29 NOTE — Telephone Encounter (Signed)
Attempted to call pt but unable to reach. Left message for her to return call. 

## 2020-04-29 NOTE — Telephone Encounter (Signed)
Pt returning call.  [phone]

## 2020-04-29 NOTE — Telephone Encounter (Signed)
Spoke with patient. She was made aware of CY's recommendations. Verbalized understanding.   Nothing further needed at time of call.

## 2020-05-18 ENCOUNTER — Encounter: Payer: Self-pay | Admitting: Physical Therapy

## 2020-05-18 ENCOUNTER — Ambulatory Visit: Payer: Medicare Other | Attending: Rheumatology | Admitting: Physical Therapy

## 2020-05-18 ENCOUNTER — Other Ambulatory Visit: Payer: Self-pay

## 2020-05-18 DIAGNOSIS — R262 Difficulty in walking, not elsewhere classified: Secondary | ICD-10-CM

## 2020-05-18 DIAGNOSIS — M25511 Pain in right shoulder: Secondary | ICD-10-CM | POA: Insufficient documentation

## 2020-05-18 DIAGNOSIS — M797 Fibromyalgia: Secondary | ICD-10-CM

## 2020-05-18 DIAGNOSIS — M6281 Muscle weakness (generalized): Secondary | ICD-10-CM

## 2020-05-18 DIAGNOSIS — M25512 Pain in left shoulder: Secondary | ICD-10-CM | POA: Insufficient documentation

## 2020-05-18 DIAGNOSIS — G8929 Other chronic pain: Secondary | ICD-10-CM

## 2020-05-18 NOTE — Therapy (Signed)
[facility] [address], [place], [address] Phone: [phone]   Fax:  [phone]  Physical Therapy Evaluation  Patient Details  Name: Samantha [name] MRN: [id number] Date of Birth: 01-May-1946 Referring Provider (PT): Bo Merino, MD   Encounter Date: 05/18/2020   PT End of Session - 05/18/20 1737    Visit Number 1    Number of Visits 24    Date for PT Re-Evaluation 08/10/20    Authorization Type [employer] MEDICARE reporting period from 05/18/2020    Progress Note Due on Visit 10    PT Start Time 0950    PT Stop Time 1050    PT Time Calculation (min) 60 min    Activity Tolerance Patient limited by pain    Behavior During Therapy [facility] for tasks assessed/performed           Past Medical History:  Diagnosis Date  . ALLERGIC RHINITIS 07/26/2008  . ASTHMA 04/14/2007  . Asthma   . ASYMPTOMATIC POSTMENOPAUSAL STATUS 05/10/2008  . Bronchiectasis ([place])   . CHEST PAIN 08/26/2008  . COLONIC POLYPS, HX OF 04/14/2007   colonic leiomyoma  . Decreased grip strength   . Episcleritis   . FIBROIDS, UTERUS 05/10/2008  . Fibromyalgia   . Gait abnormality   . GERD 04/14/2007  . HYPERCHOLESTEROLEMIA 01/07/2008  . HYPERGLYCEMIA 11/28/2009  . HYPERTENSION 01/28/2008  . INSOMNIA 05/10/2008  . Irritable bowel syndrome 04/06/2010  . OSTEOARTHRITIS 11/28/2009  . 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 SINUSITIS 02/10/2009    Past Surgical History:  Procedure Laterality Date  . COLONOSCOPY W/ POLYPECTOMY    . ELECTROCARDIOGRAM  12/04/2006  . IR RADIOLOGY PERIPHERAL GUIDED IV START  04/21/2018  . IR US GUIDE VASC ACCESS LEFT  04/21/2018  . NASAL SEPTUM SURGERY    . Stress Cardiolite  02/13/2002    There were no vitals filed for this visit.    Subjective Assessment - 05/18/20 1020    Subjective Patient reports she is here due to pain. She states today is a better day but she has a lot of pain in her shoulders, back,  and arms. She also wanted PT for her legs which have gotten worse since she saw her MD. Reports her goals are to have no pain. Reports she has had fibromyalgia since sometime around 2011. She thinks she has had symptoms prior to that. She was having pain in her body and her doctor referred her to a rheumatologist who ruled out a lot of conditions including RA and felt that the only thing left it could be was fibromyalgia. She states it was hard for her to get out of the car and get up. She had to shuffle her feet at first before she could walk. It felt like her joints had locked up. She went to an orthopedic who referred her to the rheumatologist because they thought she might have fibromyalgia. She was then diagnosed with fibromyalgia several years ago by Dr. Estanislado Pandy who put her on tons of vitamins. Patient does not want prescription drugs. Feels like the vitamins help a lot. She reports pain in her shoulders and back (upper > lower). She gets a lot of cramps. She has a history of R knee meniscus tear years ago with surgery in 2013. She states the R knee still bothers her some and almost "throws her down" occasionally. Has lost a lot of weight without trying. Thinks maybe she has not eaten as much. Gradually  losing. At one time she was 145 lbs and she is now 132.5 lbs over the last several years. She drops things frequently in the right hand. She also has a lot of itching and burning in her legs L > R. Will have pain in the bottom of her foot/heel. It has been a little while since she has done exercise and she cannot remember how she responded to exercise. Hx of left hip bursitis. States she has a bulging disc in her back. Describes pain location as neck and across back of shoulders, low back to bilateral glutes, occasionally down both legs, left > R, left hip, right knee over patella, heels L>R, B hands, upper arms.    Pertinent History Patient is a 74 y.o. female who presents to outpatient physical therapy  with a referral for medical diagnosis primary osteoarthritis of both hands, primary osteoarthritis of both shoulders, fibromyalgia. This patient's chief complaints consist of widespread pain in neck, back, B shoulders, UE including hands, hips, LEs including feet leading to the following functional deficits: difficulty with ADLs, IADLs, carrying groceries, putting gate down on vehicle, sleeping, laying down, getting up from the chair, walking, tolerating activity, holding items, pushing, pulling, laundry, etc. Relevant past medical history and comorbidities include slightly enlarged heart, bronchiectasis, symptomatic asthma and allergies, gradual unintensional weight loss (10 lbs over years? Patient unclear), ovarian cysts and was referred to oncologist and supposed to have a scan (October), increased urgency for bladder recently, back pain with bulging disk, fibromyalgia, R knee meniscus tear with surgery in 2013,  raynaud's, BIS, insomnia, GERD, hx left hip bursitis, gait abnormality, chest pain .  Patient denies hx of cancer, stroke, seizures, lung problem besides above, major cardiac events (except enlarged heart), diabetes, new onset stumbling or dropping things (does have chronic dropping things).    Limitations Lifting;Standing;Walking;House hold activities;Writing;Other (comment)   difficulty with ADLs, IADLs, carrying groceries, putting gate down on vehicle, sleeping, laying down, getting up from the chair, walking, tolerating activity, holding items, pushing, pulling, laundry, etc.   Diagnostic tests 12/21/19 shoulder radiograph: "You have similar findings in both shoulders with arthritis at the joint where the collarbone attaches to the shoulder blade."    Patient Stated Goals to have no pain.    Currently in Pain? Yes   B: 10/10; B: 3/10   Pain Score 6     Pain Location --   Neck and across back of shoulders, low back to bilateral glutes, occasionally down both legs, left > R, left hip, right knee  over patella, heels L>R, B hands, upper arms.   Pain Orientation Other (Comment)   all over   Pain Descriptors / Indicators Spasm;Cramping;Burning;Tingling   tingling in bilateral feet   Pain Type Chronic pain    Pain Radiating Towards bilateral UE and LE    Pain Onset More than a month ago    Pain Frequency Intermittent    Aggravating Factors  walking a lot, going out, carruing, putting gait on car down, layind down, getting up from chair, activities, bending, getting clothes out of washing machine    Pain Relieving Factors massage, tylenol, resting    Effect of Pain on Daily Activities Functional Limitations: difficulty with ADLs, IADLs, carrying groceries, putting gate down on vehicle, sleeping, laying down, getting up from the chair, walking, tolerating activity, holding items, pushing, pulling, laundry, etc.              [facility] PT Assessment - 05/18/20 0001  Assessment   Medical Diagnosis Primary osteoarthritis of both hands, Primary osteoarthritis of both shoulders, Fibromyalgia, aquatic therapy    Referring Provider (PT) Bo Merino, MD    Hand Dominance Right    Next MD Visit 06/27/2020    Prior Therapy yes      Precautions   Precautions Fall;Other (comment)   don't lift over 10 lbs but she does anyway     Restrictions   Weight Bearing Restrictions No      Balance Screen   Has the patient fallen in the past 6 months No   staggers at times, uses walls and furnature to prevent falls   Has the patient had a decrease in activity level because of a fear of falling?  No    Is the patient reluctant to leave their home because of a fear of falling?  No      Home Environment   Living Environment Private residence    Living Arrangements Alone    Type of [place] Access Level entry    [place] - single point;Walker - 2 wheels   does not use; from previous surgery on R knee     Prior Function   Level of Independence  Independent    Vocation Retired   worked at [employer], Designer, television/film set   Leisure puzzles, sing, used to go to senior center for exercise prior to [employer] (stopped due to a dog coming flaring allergies)      Cognition   Overall Cognitive Status Within Functional Limits for tasks assessed      Observation/Other Assessments   Focus on Therapeutic Outcomes (FOTO)  51            OBJECTIVE  OBSERVATION/INSPECTION Posture: mildly forward head, rounded shoulders, slumped in sitting. Increased kyphosis at CT junction. Hands appear mostly normal. . Tremor: none noted . Muscle bulk: appears WFL . Transfers: sit <> stand mod I with increased time and stuttering movement . Gait: gait WFL for household and short community distances once she gets going but does have some lurching and stuttering initially.   NEUROLOGICAL Dermatomes . C2-T1 appears equal and intact to light touch.  . L2-S2 appears equal and intact to light touch. Myotomes . C2-T1 appears intact . L2-S2 appears intact  SPINE MOTION Cervical Spine AROM *Indicates pain Moderately limited all motions and painful at CT junction.  Lumbar AROM - deferred  PERIPHERAL JOINT MOTION (in degrees)  Active Range of Motion (AROM)  *Indicates pain 05/18/20 Date Date  Joint/Motion R/L R/L R/L  Shoulder     Flexion 140*/150* / /  Comments:  05/18/20: pain at lateral arm upon return from elevation. ER WFL but painful.  B elbow and wrists WFL.   MUSCLE PERFORMANCE (MMT):  *Indicates pain 05/18/2020 Date Date  Joint/Motion R/L R/L R/L  Shoulder     Flexion 4*/4* / /  Abduction 4*/4* / /  External rotation 4*/4+ / /  Internal rotation 4+/4+ / /  Extension / / /  Elbow     Flexion 4*/4* / /  Extension 4+/4+ / /  Wrist     Flexion 44* / /  Extension 4/4 / /  Radial deviation 4/4 / /  Ulnar deviation 4/4 / /  Pronation 4+/4+ / /  Supination 4+/4+ / /  Comments:  05/18/2020:  B finger abduction and thumb extension WFL B LE equal or greater  than 4/5 bilaterally. Hip abduction/extension  measured in standing.  Grip Strength in lbs, average of three measures:  R: (47+44+35)/3 = 42 L: (44+43+43)/3 = 43  FUNCTIONAL/BALANCE TESTS: Five Time Sit to Stand (5TSTS): 20 seconds, BUE use on 18.5 inch plinth, motion jerky and not smooth.  Timed Up and Go (TUG): 8 seconds no assistive device  EDUCATION/COGNITION: Patient is alert and oriented X 4.  Objective measurements completed on examination: See above findings.     TREATMENT:   Therapeutic exercise: to centralize symptoms and improve ROM, strength, muscular endurance, and activity tolerance required for successful completion of functional activities.  - sit <> stand x 5  - seated scapular retraction x 10 - Education on diagnosis, prognosis, POC, anatomy and physiology of current condition.  - Education on HEP including handout   HOME EXERCISE PROGRAM Access Code: [email or web address] URL: [email or web address] Date: 05/18/2020 Prepared by: Rosita Kea  Exercises Sit to Stand without Arm Support - 1 x daily - 3 sets - 10 reps Seated Scapular Retraction - 1 x daily - 3 sets - 10 reps     PT Education - 05/18/20 1736    Education Details Exercise purpose/form. Self management techniques. Education on diagnosis, prognosis, POC, anatomy and physiology of current condition Education on HEP including handout    Person(s) Educated Patient    Methods Explanation;Demonstration;Tactile cues;Verbal cues;Handout    Comprehension Verbalized understanding;Returned demonstration;Verbal cues required;Tactile cues required;Need further instruction            PT Short Term Goals - 05/18/20 1739      PT SHORT TERM GOAL #1   Title Be independent with initial home exercise program for self-management of symptoms.    Baseline Initial HEP provided at IE (05/18/2020);    Time 2    Period Weeks    Status New             PT Long Term Goals - 05/18/20 1159      PT LONG TERM GOAL  #1   Title Be independent with a long-term home exercise program for self-management of symptoms.    Baseline initial HEP provided at IE (05/18/2020);    Time 12    Period Weeks    Status --   TARGET DATE FOR ALL LONG TERM GOALS: 08/10/2020     PT LONG TERM GOAL #2   Title Demonstrate improved FOTO score to equal or greater than 58 by visit #12 to demonstrate improvement in overall condition and self-reported functional ability.    Baseline 51 (05/18/2020);    Time 12    Period Weeks    Status New      PT LONG TERM GOAL #3   Title Patient will complete 5 Time Sit to Stand from 18 inch surface with no UE support in equal or less than 16 seconds to improve her ability to perform ADLs, transfers, and be able to carry things when standing up from her chair.    Baseline 20 seconds, BUE use on 18.5 inch plinth (05/18/2020);    Time 12    Period Weeks    Status New      PT LONG TERM GOAL #4   Title Patient will improve R grip strength by 5 lbs to improve her ability to use her R hand for holding and manipulating objects.    Baseline R: (47+44+35)/3 = 42 lbs (05/18/2020);    Time 12    Period Weeks    Status New      PT LONG TERM GOAL #5  Title Patient will report improvement in ability to complete community, work and/or recreational activities with at least 50% less limitation due to current condition.    Baseline Functional Limitations: carrying groceries, putting gate down, difficulty sleeping, laying down, getting up from the chair, completing ADLs and IADLs, pushing, pulling, laundry (05/18/2020);    Time 12    Period Weeks    Status New      Additional Long Term Goals   Additional Long Term Goals Yes      PT LONG TERM GOAL #6   Title Improve B shoulder AROM flexion to equal or greater than 160 degrees without increased pain to improve ability to reach and complete ADLs such as dressing with less difficulty.    Baseline R = 140, L = 150 both painful (05/18/2020);    Time 12    Period Weeks     Status New                  Plan - 05/18/20 1751    Clinical Impression Statement Patient is a 74 y.o. female referred to outpatient physical therapy with a medical diagnosis of primary osteoarthritis of both hands, primary osteoarthritis of both shoulders, fibromyalgia. who presents with signs and symptoms consistent with fibromyalgia with widespread pain throughout body and especially affecting neck, B shoulders, hands R > L, back, B LE R > L resulting in generalized deconditioning and decreased activity tolerance and function. Patient presents with significant pain, stiffness, ROM, activity tolerance, muscle performance (strength/power/endurance), balance impairments that are limiting ability to complete her usual activities including ADLs, IADLs, carrying groceries, putting gate down on vehicle, sleeping, laying down, getting up from the chair, walking, tolerating activity, holding items, pushing, pulling, laundry, etc, without difficulty. Patient will benefit from skilled physical therapy intervention to address current body structure impairments and activity limitations to improve function and work towards goals set in current POC in order to return to prior level of function or maximal functional improvement.    Personal Factors and Comorbidities Age;Comorbidity 3+;Time since onset of injury/illness/exacerbation;Fitness;Past/Current Experience    Comorbidities Relevant past medical history and comorbidities include slightly enlarged heart, bronchiectasis, symptomatic asthma and allergies, gradual unintensional weight loss (10 lbs over years? Patient unclear), ovarian cysts and was referred to oncologist and supposed to have a scan (October), increased urgency for bladder recently, back pain with bulging disk, fibromyalgia, R knee meniscus tear with surgery in 2013,  raynaud's, IBS, insomnia, GERD, hx left hip bursitis, gait abnormality, chest pain    Examination-Activity Limitations  Bathing;Hygiene/Grooming;Squat;Lift;Stairs;Bed Mobility;Bend;Locomotion Level;Stand;Caring for Others;Reach Overhead;Carry;Transfers;Sit;Dressing;Sleep    Examination-Participation Restrictions Laundry;Cleaning;Community Activity;Interpersonal Relationship;Meal Prep;Shop    Stability/Clinical Decision Making Evolving/Moderate complexity    Clinical Decision Making Moderate    Rehab Potential Fair    PT Frequency 2x / week    PT Duration 12 weeks    PT Treatment/Interventions ADLs/Self Care Home Management;Aquatic Therapy;Cryotherapy;Moist Heat;Electrical Stimulation;Gait training;Stair training;Functional mobility training;Therapeutic activities;Therapeutic exercise;Balance training;Neuromuscular re-education;Patient/family education;Manual techniques;Dry needling;Passive range of motion;Energy conservation;Spinal Manipulations;Joint Manipulations    PT Next Visit Plan assess response to HEP, graded exercise for improved strength and ROM    PT Home Exercise Plan Medbridge Access Code: [email or web address]    Consulted and Agree with Plan of Care Patient           Patient will benefit from skilled therapeutic intervention in order to improve the following deficits and impairments:  Abnormal gait, Pain, Decreased mobility, Decreased coordination, Decreased activity tolerance, Decreased endurance, Decreased range of motion, Decreased  strength, Hypomobility, Impaired perceived functional ability, Impaired UE functional use, Impaired flexibility, Difficulty walking, Decreased balance  Visit Diagnosis: Fibromyalgia  Muscle weakness (generalized)  Difficulty in walking, not elsewhere classified  Chronic right shoulder pain  Chronic left shoulder pain     Problem List Patient Active Problem List   Diagnosis Date Noted  . Shoulder pain 12/24/2019  . Myalgia 12/24/2019  . Bronchiectasis without complication ([place]) 42/59/5638  . Dysphagia 07/23/2019  . Abdominal pain 07/23/2019  . Nonintractable  headache 03/19/2019  . Paresthesia 03/19/2019  . Decreased grip strength 02/04/2019  . Clavicle pain 02/04/2019  . Itching 02/02/2019  . Local superficial swelling 02/02/2019  . Weight loss 02/02/2019  . Osteopenia 08/09/2018  . Chronic rhinitis 07/23/2018  . 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 Healthcare maintenance 07/16/2018  . Moderate persistent asthma with acute exacerbation 01/30/2018  . Advance care planning 01/02/2018  . Vitamin D deficiency 07/08/2017  . 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/19/2017  . Primary osteoarthritis of both knees 01/19/2017  . Primary osteoarthritis of both feet 01/19/2017  . Cough 11/14/2016  . SOB (shortness of breath) 11/14/2016  . Diverticulosis 03/19/2016  . Rash 10/03/2015  . Lumbar compression fracture ([place]) 06/20/2015  . Fibromyalgia 02/26/2013  . Allergic rhinitis due to pollen 11/20/2010  . 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 IRRITABLE BOWEL SYNDROME 04/06/2010  . THYROID NODULE, RIGHT 12/07/2009  . OSTEOARTHRITIS 11/28/2009  . FIBROIDS, UTERUS 05/10/2008  . Insomnia 05/10/2008  . ASYMPTOMATIC POSTMENOPAUSAL STATUS 05/10/2008  . Essential hypertension 01/28/2008  . HYPERCHOLESTEROLEMIA 01/07/2008  . GLAUCOMA 01/07/2008  . Gastroesophageal reflux disease 04/14/2007  . Asthma 04/14/2007   Everlean Alstrom. Graylon Good, PT, DPT 05/18/20, 5:54 PM  [facility] [address] [place], [address] Phone: [phone]   Fax:  [phone]  Name: Samantha [name] MRN: [id number] Date of Birth: Feb 07, 1946

## 2020-05-23 ENCOUNTER — Encounter: Payer: Self-pay | Admitting: Physical Therapy

## 2020-05-23 ENCOUNTER — Other Ambulatory Visit: Payer: Self-pay

## 2020-05-23 ENCOUNTER — Ambulatory Visit: Payer: Medicare Other | Admitting: Physical Therapy

## 2020-05-23 DIAGNOSIS — R262 Difficulty in walking, not elsewhere classified: Secondary | ICD-10-CM

## 2020-05-23 DIAGNOSIS — G8929 Other chronic pain: Secondary | ICD-10-CM | POA: Diagnosis not present

## 2020-05-23 DIAGNOSIS — M6281 Muscle weakness (generalized): Secondary | ICD-10-CM | POA: Diagnosis not present

## 2020-05-23 DIAGNOSIS — M25511 Pain in right shoulder: Secondary | ICD-10-CM

## 2020-05-23 DIAGNOSIS — M797 Fibromyalgia: Secondary | ICD-10-CM

## 2020-05-23 DIAGNOSIS — M25512 Pain in left shoulder: Secondary | ICD-10-CM

## 2020-05-23 NOTE — Therapy (Signed)
[facility] [address] [place], [address] Phone: [phone]   Fax:  [phone]  Physical Therapy Treatment  Patient Details  Name: Samantha [name] MRN: [id number] Date of Birth: 1946/03/16 Referring Provider (PT): Bo Merino, MD   Encounter Date: 05/23/2020   PT End of Session - 05/23/20 0912    Visit Number 2    Number of Visits 24    Date for PT Re-Evaluation 08/10/20    Authorization Type [employer]ICARE reporting period from 05/18/2020    Progress Note Due on Visit 10    Activity Tolerance Patient limited by pain    Behavior During Therapy [facility] for tasks assessed/performed           Past Medical History:  Diagnosis Date  . ALLERGIC RHINITIS 07/26/2008  . ASTHMA 04/14/2007  . Asthma   . ASYMPTOMATIC POSTMENOPAUSAL STATUS 05/10/2008  . Bronchiectasis ([place])   . CHEST PAIN 08/26/2008  . COLONIC POLYPS, HX OF 04/14/2007   colonic leiomyoma  . Decreased grip strength   . Episcleritis   . FIBROIDS, UTERUS 05/10/2008  . Fibromyalgia   . Gait abnormality   . GERD 04/14/2007  . HYPERCHOLESTEROLEMIA 01/07/2008  . HYPERGLYCEMIA 11/28/2009  . HYPERTENSION 01/28/2008  . INSOMNIA 05/10/2008  . Irritable bowel syndrome 04/06/2010  . OSTEOARTHRITIS 11/28/2009  . 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 SINUSITIS 02/10/2009    Past Surgical History:  Procedure Laterality Date  . COLONOSCOPY W/ POLYPECTOMY    . ELECTROCARDIOGRAM  12/04/2006  . IR RADIOLOGY PERIPHERAL GUIDED IV START  04/21/2018  . IR US GUIDE VASC ACCESS LEFT  04/21/2018  . NASAL SEPTUM SURGERY    . Stress Cardiolite  02/13/2002    There were no vitals filed for this visit.   Subjective Assessment - 05/23/20 0905    Subjective Patient reports she is feeling well today and has no pain upon arrival. She felt okay following last treatment session. She has been doing her HEP and it has gone well. She states the room was too cold for her  and she got a runny nose and a little larangitis, which happens to her when she is in a cold envirnoment. Requests not to go in that room again.    Pertinent History Patient is a 74 y.o. female who presents to outpatient physical therapy with a referral for medical diagnosis primary osteoarthritis of both hands, primary osteoarthritis of both shoulders, fibromyalgia. This patient's chief complaints consist of widespread pain in neck, back, B shoulders, UE including hands, hips, LEs including feet leading to the following functional deficits: difficulty with ADLs, IADLs, carrying groceries, putting gate down on vehicle, sleeping, laying down, getting up from the chair, walking, tolerating activity, holding items, pushing, pulling, laundry, etc. Relevant past medical history and comorbidities include slightly enlarged heart, bronchiectasis, symptomatic asthma and allergies, gradual unintensional weight loss (10 lbs over years? Patient unclear), ovarian cysts and was referred to oncologist and supposed to have a scan (October), increased urgency for bladder recently, back pain with bulging disk, fibromyalgia, R knee meniscus tear with surgery in 2013,  raynaud's, BIS, insomnia, GERD, hx left hip bursitis, gait abnormality, chest pain .  Patient denies hx of cancer, stroke, seizures, lung problem besides above, major cardiac events (except enlarged heart), diabetes, new onset stumbling or dropping things (does have chronic dropping things).    Limitations Lifting;Standing;Walking;House hold activities;Writing;Other (comment)   difficulty with ADLs, IADLs, carrying groceries, putting gate down on vehicle,  sleeping, laying down, getting up from the chair, walking, tolerating activity, holding items, pushing, pulling, laundry, etc.   Diagnostic tests 12/21/19 shoulder radiograph: "You have similar findings in both shoulders with arthritis at the joint where the collarbone attaches to the shoulder blade."    Patient  Stated Goals to have no pain.    Currently in Pain? No/denies    Pain Onset More than a month ago           TREATMENT:  DOES have a Latex sensitivity  Therapeutic exercise:to centralize symptoms and improve ROM, strength, muscular endurance, and activity tolerance required for successful completion of functional activities.  - NuStep level 0 using bilateral upper and lower extremities. Seat/handle setting 7. For improved extremity mobility, muscular endurance, and activity tolerance; and to induce the analgesic effect of aerobic exercise, stimulate improved joint nutrition, and prepare body structures and systems for following interventions. x 10  Minutes. Average SPM = 64.  Circuit:  - sit <> stand 3x10 from green chair (18 inches).  - seated scapular retraction 3x 10  - standing scauplar row, x20 with red theraband. 3x15 with blue theraband.  - standing B shoulder extension with red theraband x 10 (difficult) - AAROM Wall sides flexion 2x10 each side (pain that gets better with reps) - standing B shoulder ER against band, 3x10 (red/yellow/red), produces mild pain at left that improves with reps. Pt prefers red theraband.  - Education on HEP including handout    HOME EXERCISE PROGRAM Access Code: [email or web address] URL: [email or web address] Date: 05/23/2020 Prepared by: Rosita Kea  Exercises Sit to Stand without Arm Support - 1 x daily - 3 sets - 10 reps Row with band/cable - 1 x daily - 3 sets - 15 reps - blue band Shoulder Flexion Wall Slide with Towel - 1 x daily - 2 sets - 10 reps Shoulder External Rotation with Resistance - 1 x daily - 3 sets - 10 reps - red or yellow band    PT Education - 05/23/20 0911    Education Details Exercise purpose/form. Self management techniques.    Person(s) Educated Patient    Methods Explanation;Demonstration;Tactile cues;Verbal cues    Comprehension Verbalized understanding;Returned demonstration;Verbal cues required;Tactile  cues required;Need further instruction            PT Short Term Goals - 05/18/20 1739      PT SHORT TERM GOAL #1   Title Be independent with initial home exercise program for self-management of symptoms.    Baseline Initial HEP provided at IE (05/18/2020);    Time 2    Period Weeks    Status New             PT Long Term Goals - 05/18/20 1159      PT LONG TERM GOAL #1   Title Be independent with a long-term home exercise program for self-management of symptoms.    Baseline initial HEP provided at IE (05/18/2020);    Time 12    Period Weeks    Status --   TARGET DATE FOR ALL LONG TERM GOALS: 08/10/2020     PT LONG TERM GOAL #2   Title Demonstrate improved FOTO score to equal or greater than 58 by visit #12 to demonstrate improvement in overall condition and self-reported functional ability.    Baseline 51 (05/18/2020);    Time 12    Period Weeks    Status New      PT LONG TERM GOAL #3   Title Patient will  complete 5 Time Sit to Stand from 18 inch surface with no UE support in equal or less than 16 seconds to improve her ability to perform ADLs, transfers, and be able to carry things when standing up from her chair.    Baseline 20 seconds, BUE use on 18.5 inch plinth (05/18/2020);    Time 12    Period Weeks    Status New      PT LONG TERM GOAL #4   Title Patient will improve R grip strength by 5 lbs to improve her ability to use her R hand for holding and manipulating objects.    Baseline R: (47+44+35)/3 = 42 lbs (05/18/2020);    Time 12    Period Weeks    Status New      PT LONG TERM GOAL #5   Title Patient will report improvement in ability to complete community, work and/or recreational activities with at least 50% less limitation due to current condition.    Baseline Functional Limitations: carrying groceries, putting gate down, difficulty sleeping, laying down, getting up from the chair, completing ADLs and IADLs, pushing, pulling, laundry (05/18/2020);    Time 12    Period  Weeks    Status New      Additional Long Term Goals   Additional Long Term Goals Yes      PT LONG TERM GOAL #6   Title Improve B shoulder AROM flexion to equal or greater than 160 degrees without increased pain to improve ability to reach and complete ADLs such as dressing with less difficulty.    Baseline R = 140, L = 150 both painful (05/18/2020);    Time 12    Period Weeks    Status New                 Plan - 05/23/20 1001    Clinical Impression Statement Patient tolerated treatment well overall and seemed to enjoy being challenged. Was eager to try more difficult exercises. Did have some discomfort at lateral upper arms with shoulder flexion and ER but it improved with reps. Patient had slightly elevated discomfort following session. Will continue to assess for appropriate response at next session. Patient would benefit from continued management of limiting condition by skilled physical therapist to address remaining impairments and functional limitations to work towards stated goals and return to PLOF or maximal functional independence.    Personal Factors and Comorbidities Age;Comorbidity 3+;Time since onset of injury/illness/exacerbation;Fitness;Past/Current Experience    Comorbidities Relevant past medical history and comorbidities include slightly enlarged heart, bronchiectasis, symptomatic asthma and allergies, gradual unintensional weight loss (10 lbs over years? Patient unclear), ovarian cysts and was referred to oncologist and supposed to have a scan (October), increased urgency for bladder recently, back pain with bulging disk, fibromyalgia, R knee meniscus tear with surgery in 2013,  raynaud's, IBS, insomnia, GERD, hx left hip bursitis, gait abnormality, chest pain    Examination-Activity Limitations Bathing;Hygiene/Grooming;Squat;Lift;Stairs;Bed Mobility;Bend;Locomotion Level;Stand;Caring for Others;Reach Overhead;Carry;Transfers;Sit;Dressing;Sleep     Examination-Participation Restrictions Laundry;Cleaning;Community Activity;Interpersonal Relationship;Meal Prep;Shop    Stability/Clinical Decision Making Evolving/Moderate complexity    Rehab Potential Fair    PT Frequency 2x / week    PT Duration 12 weeks    PT Treatment/Interventions ADLs/Self Care Home Management;Aquatic Therapy;Cryotherapy;Moist Heat;Electrical Stimulation;Gait training;Stair training;Functional mobility training;Therapeutic activities;Therapeutic exercise;Balance training;Neuromuscular re-education;Patient/family education;Manual techniques;Dry needling;Passive range of motion;Energy conservation;Spinal Manipulations;Joint Manipulations    PT Next Visit Plan assess response to HEP, graded exercise for improved strength and ROM    PT Home Exercise  Plan Medbridge Access Code: [email or web address]    Consulted and Agree with Plan of Care Patient           Patient will benefit from skilled therapeutic intervention in order to improve the following deficits and impairments:  Abnormal gait, Pain, Decreased mobility, Decreased coordination, Decreased activity tolerance, Decreased endurance, Decreased range of motion, Decreased strength, Hypomobility, Impaired perceived functional ability, Impaired UE functional use, Impaired flexibility, Difficulty walking, Decreased balance  Visit Diagnosis: Fibromyalgia  Muscle weakness (generalized)  Difficulty in walking, not elsewhere classified  Chronic right shoulder pain  Chronic left shoulder pain     Problem List Patient Active Problem List   Diagnosis Date Noted  . Shoulder pain 12/24/2019  . Myalgia 12/24/2019  . Bronchiectasis without complication ([place]) 03/00/9233  . Dysphagia 07/23/2019  . Abdominal pain 07/23/2019  . Nonintractable headache 03/19/2019  . Paresthesia 03/19/2019  . Decreased grip strength 02/04/2019  . Clavicle pain 02/04/2019  . Itching 02/02/2019  . Local superficial swelling 02/02/2019  . Weight loss  02/02/2019  . Osteopenia 08/09/2018  . Chronic rhinitis 07/23/2018  . 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 Healthcare maintenance 07/16/2018  . Moderate persistent asthma with acute exacerbation 01/30/2018  . Advance care planning 01/02/2018  . Vitamin D deficiency 07/08/2017  . 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/19/2017  . Primary osteoarthritis of both knees 01/19/2017  . Primary osteoarthritis of both feet 01/19/2017  . Cough 11/14/2016  . SOB (shortness of breath) 11/14/2016  . Diverticulosis 03/19/2016  . Rash 10/03/2015  . Lumbar compression fracture ([place]) 06/20/2015  . Fibromyalgia 02/26/2013  . Allergic rhinitis due to pollen 11/20/2010  . 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 IRRITABLE BOWEL SYNDROME 04/06/2010  . THYROID NODULE, RIGHT 12/07/2009  . OSTEOARTHRITIS 11/28/2009  . FIBROIDS, UTERUS 05/10/2008  . Insomnia 05/10/2008  . ASYMPTOMATIC POSTMENOPAUSAL STATUS 05/10/2008  . Essential hypertension 01/28/2008  . HYPERCHOLESTEROLEMIA 01/07/2008  . GLAUCOMA 01/07/2008  . Gastroesophageal reflux disease 04/14/2007  . Asthma 04/14/2007   Everlean Alstrom. Graylon Good, PT, DPT 05/23/20, 10:22 AM  [facility] [address], [place], [address] Phone: [phone]   Fax:  [phone]  Name: Anarie Kalish MRN: [id number] Date of Birth: [birth year]

## 2020-05-26 ENCOUNTER — Ambulatory Visit: Payer: Medicare Other | Admitting: Physical Therapy

## 2020-05-26 ENCOUNTER — Encounter: Payer: Self-pay | Admitting: Physical Therapy

## 2020-05-26 ENCOUNTER — Other Ambulatory Visit: Payer: Self-pay

## 2020-05-26 DIAGNOSIS — M6281 Muscle weakness (generalized): Secondary | ICD-10-CM | POA: Diagnosis not present

## 2020-05-26 DIAGNOSIS — R262 Difficulty in walking, not elsewhere classified: Secondary | ICD-10-CM

## 2020-05-26 DIAGNOSIS — M25511 Pain in right shoulder: Secondary | ICD-10-CM

## 2020-05-26 DIAGNOSIS — G8929 Other chronic pain: Secondary | ICD-10-CM

## 2020-05-26 DIAGNOSIS — M797 Fibromyalgia: Secondary | ICD-10-CM

## 2020-05-26 DIAGNOSIS — M25512 Pain in left shoulder: Secondary | ICD-10-CM

## 2020-05-26 NOTE — Therapy (Signed)
[facility] [address] [place], [address] Phone: [phone]   Fax:  [phone]  Physical Therapy Treatment  Patient Details  Name: Samantha [name] MRN: [id number] Date of Birth: 1945/10/29 Referring Provider (PT): Bo Merino, MD   Encounter Date: 05/26/2020   PT End of Session - 05/26/20 1038    Visit Number 3    Number of Visits 24    Date for PT Re-Evaluation 08/10/20    Authorization Type [employer] MEDICARE reporting period from 05/18/2020    Progress Note Due on Visit 10    PT Start Time 1030    PT Stop Time 1110    PT Time Calculation (min) 40 min    Activity Tolerance Patient limited by pain    Behavior During Therapy [facility] for tasks assessed/performed           Past Medical History:  Diagnosis Date  . ALLERGIC RHINITIS 07/26/2008  . ASTHMA 04/14/2007  . Asthma   . ASYMPTOMATIC POSTMENOPAUSAL STATUS 05/10/2008  . Bronchiectasis ([place])   . CHEST PAIN 08/26/2008  . COLONIC POLYPS, HX OF 04/14/2007   colonic leiomyoma  . Decreased grip strength   . Episcleritis   . FIBROIDS, UTERUS 05/10/2008  . Fibromyalgia   . Gait abnormality   . GERD 04/14/2007  . HYPERCHOLESTEROLEMIA 01/07/2008  . HYPERGLYCEMIA 11/28/2009  . HYPERTENSION 01/28/2008  . INSOMNIA 05/10/2008  . Irritable bowel syndrome 04/06/2010  . OSTEOARTHRITIS 11/28/2009  . 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 SINUSITIS 02/10/2009    Past Surgical History:  Procedure Laterality Date  . COLONOSCOPY W/ POLYPECTOMY    . ELECTROCARDIOGRAM  12/04/2006  . IR RADIOLOGY PERIPHERAL GUIDED IV START  04/21/2018  . IR US GUIDE VASC ACCESS LEFT  04/21/2018  . NASAL SEPTUM SURGERY    . Stress Cardiolite  02/13/2002    There were no vitals filed for this visit.   Subjective Assessment - 05/26/20 1034    Subjective Patient reports she had a lot of pain following last treatment session Tuesday and yesterday. She awoke today with 10/10 pain at  posterior shoulders, R hip, R lower leg but she massaged the areas and they feel better now. Wants to increase the resistance on nustep despite having pain. Rates her pain 7/10 currently expecially at shoulders and R lower leg.    Pertinent History Patient is a 74 y.o. female who presents to outpatient physical therapy with a referral for medical diagnosis primary osteoarthritis of both hands, primary osteoarthritis of both shoulders, fibromyalgia. This patient's chief complaints consist of widespread pain in neck, back, B shoulders, UE including hands, hips, LEs including feet leading to the following functional deficits: difficulty with ADLs, IADLs, carrying groceries, putting gate down on vehicle, sleeping, laying down, getting up from the chair, walking, tolerating activity, holding items, pushing, pulling, laundry, etc. Relevant past medical history and comorbidities include slightly enlarged heart, bronchiectasis, symptomatic asthma and allergies, gradual unintensional weight loss (10 lbs over years? Patient unclear), ovarian cysts and was referred to oncologist and supposed to have a scan (October), increased urgency for bladder recently, back pain with bulging disk, fibromyalgia, R knee meniscus tear with surgery in 2013,  raynaud's, BIS, insomnia, GERD, hx left hip bursitis, gait abnormality, chest pain .  Patient denies hx of cancer, stroke, seizures, lung problem besides above, major cardiac events (except enlarged heart), diabetes, new onset stumbling or dropping things (does have chronic dropping things).    Limitations Lifting;Standing;Walking;House hold  activities;Writing;Other (comment)   difficulty with ADLs, IADLs, carrying groceries, putting gate down on vehicle, sleeping, laying down, getting up from the chair, walking, tolerating activity, holding items, pushing, pulling, laundry, etc.   Diagnostic tests 12/21/19 shoulder radiograph: "You have similar findings in both shoulders with arthritis  at the joint where the collarbone attaches to the shoulder blade."    Patient Stated Goals to have no pain.    Currently in Pain? Yes    Pain Score 7     Pain Onset More than a month ago            TREATMENT: DOES have a Latex sensitivity  Therapeutic exercise:to centralize symptoms and improve ROM, strength, muscular endurance, and activity tolerance required for successful completion of functional activities. - NuStep level 0 (1 per pt request, but returned to 0 due to pain) using bilateral upper and lower extremities. Seat setting 7/handle setting 9. For improved extremity mobility, muscular endurance, and activity tolerance; and to induce the analgesic effect of aerobic exercise, stimulate improved joint nutrition, and prepare body structures and systems for following interventions. x 10  Minutes. Average SPM = 63. - supine AAROM B shoulder flexion with stick, x 20 (painful)  Manual therapy: to reduce pain and tissue tension, improve range of motion, neuromodulation, in order to promote improved ability to complete functional activities. Supine with feet elevated for comfort:  - STM with trigger point release to bilateral UT and around B shoulders.  - joint mobilizations grade I-II distraction from neutral and AP at B GHJ for pain control and relaxation.  - PROM to B shoulders x 10 reps in comfortable range: flexion, abduction, ER, IR.   HOME EXERCISE PROGRAM Access Code: [email or web address] URL: [email or web address] Date: 05/23/2020 Prepared by: Rosita Kea  Exercises Sit to Stand without Arm Support - 1 x daily - 3 sets - 10 reps Row with band/cable - 1 x daily - 3 sets - 15 reps - blue band Shoulder Flexion Wall Slide with Towel - 1 x daily - 2 sets - 10 reps Shoulder External Rotation with Resistance - 1 x daily - 3 sets - 10 reps - red or yellow band    PT Education - 05/26/20 1038    Education Details Exercise purpose/form. Self management techniques.     Person(s) Educated Patient    Methods Explanation;Demonstration;Tactile cues;Verbal cues    Comprehension Verbalized understanding;Returned demonstration;Verbal cues required;Tactile cues required;Need further instruction            PT Short Term Goals - 05/18/20 1739      PT SHORT TERM GOAL #1   Title Be independent with initial home exercise program for self-management of symptoms.    Baseline Initial HEP provided at IE (05/18/2020);    Time 2    Period Weeks    Status New             PT Long Term Goals - 05/18/20 1159      PT LONG TERM GOAL #1   Title Be independent with a long-term home exercise program for self-management of symptoms.    Baseline initial HEP provided at IE (05/18/2020);    Time 12    Period Weeks    Status --   TARGET DATE FOR ALL LONG TERM GOALS: 08/10/2020     PT LONG TERM GOAL #2   Title Demonstrate improved FOTO score to equal or greater than 58 by visit #12 to demonstrate improvement in overall condition and self-reported functional ability.  Baseline 51 (05/18/2020);    Time 12    Period Weeks    Status New      PT LONG TERM GOAL #3   Title Patient will complete 5 Time Sit to Stand from 18 inch surface with no UE support in equal or less than 16 seconds to improve her ability to perform ADLs, transfers, and be able to carry things when standing up from her chair.    Baseline 20 seconds, BUE use on 18.5 inch plinth (05/18/2020);    Time 12    Period Weeks    Status New      PT LONG TERM GOAL #4   Title Patient will improve R grip strength by 5 lbs to improve her ability to use her R hand for holding and manipulating objects.    Baseline R: (47+44+35)/3 = 42 lbs (05/18/2020);    Time 12    Period Weeks    Status New      PT LONG TERM GOAL #5   Title Patient will report improvement in ability to complete community, work and/or recreational activities with at least 50% less limitation due to current condition.    Baseline Functional Limitations:  carrying groceries, putting gate down, difficulty sleeping, laying down, getting up from the chair, completing ADLs and IADLs, pushing, pulling, laundry (05/18/2020);    Time 12    Period Weeks    Status New      Additional Long Term Goals   Additional Long Term Goals Yes      PT LONG TERM GOAL #6   Title Improve B shoulder AROM flexion to equal or greater than 160 degrees without increased pain to improve ability to reach and complete ADLs such as dressing with less difficulty.    Baseline R = 140, L = 150 both painful (05/18/2020);    Time 12    Period Weeks    Status New                 Plan - 05/26/20 1425    Clinical Impression Statement Patient reports she is pain free by end of session and shoulders feel better. Focused on pain control and gentle exercise this session. Did have some pain with exercise but did not last following manual therapy. Patient would benefit from continued management of limiting condition by skilled physical therapist to address remaining impairments and functional limitations to work towards stated goals and return to PLOF or maximal functional independence.    Personal Factors and Comorbidities Age;Comorbidity 3+;Time since onset of injury/illness/exacerbation;Fitness;Past/Current Experience    Comorbidities Relevant past medical history and comorbidities include slightly enlarged heart, bronchiectasis, symptomatic asthma and allergies, gradual unintensional weight loss (10 lbs over years? Patient unclear), ovarian cysts and was referred to oncologist and supposed to have a scan (October), increased urgency for bladder recently, back pain with bulging disk, fibromyalgia, R knee meniscus tear with surgery in 2013,  raynaud's, IBS, insomnia, GERD, hx left hip bursitis, gait abnormality, chest pain    Examination-Activity Limitations Bathing;Hygiene/Grooming;Squat;Lift;Stairs;Bed Mobility;Bend;Locomotion Level;Stand;Caring for Others;Reach  Overhead;Carry;Transfers;Sit;Dressing;Sleep    Examination-Participation Restrictions Laundry;Cleaning;Community Activity;Interpersonal Relationship;Meal Prep;Shop    Stability/Clinical Decision Making Evolving/Moderate complexity    Rehab Potential Fair    PT Frequency 2x / week    PT Duration 12 weeks    PT Treatment/Interventions ADLs/Self Care Home Management;Aquatic Therapy;Cryotherapy;Moist Heat;Electrical Stimulation;Gait training;Stair training;Functional mobility training;Therapeutic activities;Therapeutic exercise;Balance training;Neuromuscular re-education;Patient/family education;Manual techniques;Dry needling;Passive range of motion;Energy conservation;Spinal Manipulations;Joint Manipulations    PT Next Visit Plan assess  response to HEP, graded exercise for improved strength and ROM    PT Home Exercise Plan Medbridge Access Code: [email or web address]    Consulted and Agree with Plan of Care Patient           Patient will benefit from skilled therapeutic intervention in order to improve the following deficits and impairments:  Abnormal gait, Pain, Decreased mobility, Decreased coordination, Decreased activity tolerance, Decreased endurance, Decreased range of motion, Decreased strength, Hypomobility, Impaired perceived functional ability, Impaired UE functional use, Impaired flexibility, Difficulty walking, Decreased balance  Visit Diagnosis: Fibromyalgia  Muscle weakness (generalized)  Difficulty in walking, not elsewhere classified  Chronic right shoulder pain  Chronic left shoulder pain     Problem List Patient Active Problem List   Diagnosis Date Noted  . Shoulder pain 12/24/2019  . Myalgia 12/24/2019  . Bronchiectasis without complication ([place]) 51/76/1607  . Dysphagia 07/23/2019  . Abdominal pain 07/23/2019  . Nonintractable headache 03/19/2019  . Paresthesia 03/19/2019  . Decreased grip strength 02/04/2019  . Clavicle pain 02/04/2019  . Itching 02/02/2019  . Local  superficial swelling 02/02/2019  . Weight loss 02/02/2019  . Osteopenia 08/09/2018  . Chronic rhinitis 07/23/2018  . 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 Healthcare maintenance 07/16/2018  . Moderate persistent asthma with acute exacerbation 01/30/2018  . Advance care planning 01/02/2018  . Vitamin D deficiency 07/08/2017  . 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/19/2017  . Primary osteoarthritis of both knees 01/19/2017  . Primary osteoarthritis of both feet 01/19/2017  . Cough 11/14/2016  . SOB (shortness of breath) 11/14/2016  . Diverticulosis 03/19/2016  . Rash 10/03/2015  . Lumbar compression fracture ([place]) 06/20/2015  . Fibromyalgia 02/26/2013  . Allergic rhinitis due to pollen 11/20/2010  . 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 IRRITABLE BOWEL SYNDROME 04/06/2010  . THYROID NODULE, RIGHT 12/07/2009  . OSTEOARTHRITIS 11/28/2009  . FIBROIDS, UTERUS 05/10/2008  . Insomnia 05/10/2008  . ASYMPTOMATIC POSTMENOPAUSAL STATUS 05/10/2008  . Essential hypertension 01/28/2008  . HYPERCHOLESTEROLEMIA 01/07/2008  . GLAUCOMA 01/07/2008  . Gastroesophageal reflux disease 04/14/2007  . Asthma 04/14/2007    Everlean Alstrom. Graylon Good, PT, DPT 05/26/20, 2:26 PM  [facility] [address] [place], [address] Phone: [phone]   Fax:  [phone]  Name: Trinika Cortese MRN: [id number] Date of Birth: May 27, 1946

## 2020-05-30 ENCOUNTER — Ambulatory Visit (INDEPENDENT_AMBULATORY_CARE_PROVIDER_SITE_OTHER): Payer: Medicare Other | Admitting: Family Medicine

## 2020-05-30 ENCOUNTER — Other Ambulatory Visit: Payer: Self-pay

## 2020-05-30 ENCOUNTER — Ambulatory Visit: Payer: Medicare Other | Admitting: Physical Therapy

## 2020-05-30 ENCOUNTER — Encounter: Payer: Self-pay | Admitting: Family Medicine

## 2020-05-30 VITALS — BP 140/62 | HR 73 | Temp 98.0°F | Wt 135.8 lb

## 2020-05-30 DIAGNOSIS — B360 Pityriasis versicolor: Secondary | ICD-10-CM | POA: Diagnosis not present

## 2020-05-30 DIAGNOSIS — L299 Pruritus, unspecified: Secondary | ICD-10-CM | POA: Diagnosis not present

## 2020-05-30 MED ORDER — TERBINAFINE HCL 1 % EX CREA: 1.0000 "application " | TOPICAL_CREAM | Freq: Two times a day (BID) | CUTANEOUS | 0 refills | Status: DC

## 2020-05-30 NOTE — Patient Instructions (Signed)
itching - Cetaphil and tub lotion - cream or ointment base lotion - lab work today - continue with plan to see the dermatologist  Ring worm - use the lamisil topical cream   Return as needed to see Dr. Damita Dunnings

## 2020-05-30 NOTE — Progress Notes (Signed)
   Subjective:     Samantha [name] is a 74 y.o. female presenting for Rash (arms, neck, chest, face, back "itching and burning")     HPI   #Rash - has appt on 06/21/2020 for dermatology - itching and burning on the back and posterior neck - Friday also had itching and burning on the face and arm with fine bumps - treatment: lotion Eucerin skin for dry/itchy skin - with some improvement but does not stop the symptoms - also using Neutrogena face cream - has been having back itching for 1 year - neck - a few months ago - face - friday   Review of Systems   Social History   Tobacco Use  Smoking Status Never Smoker  Smokeless Tobacco Never Used        Objective:    BP Readings from Last 3 Encounters:  05/30/20 140/62  03/04/20 (!) 142/66  12/30/19 139/78   Wt Readings from Last 3 Encounters:  05/30/20 135 lb 12 oz (61.6 kg)  03/04/20 134 lb 7 oz (61 kg)  12/30/19 133 lb 6.4 oz (60.5 kg)    BP 140/62   Pulse 73   Temp 98 F (36.7 C) (Temporal)   Wt 135 lb 12 oz (61.6 kg)   SpO2 98%   BMI 25.07 kg/m    Physical Exam Constitutional:      General: She is not in acute distress.    Appearance: She is well-developed. She is not diaphoretic.  HENT:     Right Ear: External ear normal.     Left Ear: External ear normal.     Nose: Nose normal.  Eyes:     Conjunctiva/sclera: Conjunctivae normal.  Cardiovascular:     Rate and Rhythm: Normal rate.  Pulmonary:     Effort: Pulmonary effort is normal.  Musculoskeletal:     Cervical back: Neck supple.  Skin:    General: Skin is warm and dry.     Capillary Refill: Capillary refill takes less than 2 seconds.     Comments: Face, back, neck without discoloration, raised lesions or sign of rash. Right arm and right shin with circular hypopigmented macular lesion.   Neurological:     Mental Status: She is alert. Mental status is at baseline.  Psychiatric:        Mood and Affect: Mood normal.        Behavior:  Behavior normal.           Assessment & Plan:   Problem List Items Addressed This Visit      Musculoskeletal and Integument   Tinea versicolor - Primary    Well circumscribed hypopigmented lesions. Recommend topical treatment and return if more lesions or not responding      Relevant Medications   terbinafine (LAMISIL) 1 % cream   Pruritus    No rash present, recommend blood work and pt already with dermatology appointment. Recommended trying a different lotion/cream and r/o liver/kidney disease      Relevant Orders   Comprehensive metabolic panel       Return if symptoms worsen or fail to improve.  Lesleigh Noe, MD  This visit occurred during the SARS-CoV-2 public health emergency.  Safety protocols were in place, including screening questions prior to the visit, additional usage of staff PPE, and extensive cleaning of exam room while observing appropriate contact time as indicated for disinfecting solutions.

## 2020-05-30 NOTE — Assessment & Plan Note (Signed)
Well circumscribed hypopigmented lesions. Recommend topical treatment and return if more lesions or not responding

## 2020-05-30 NOTE — Assessment & Plan Note (Signed)
No rash present, recommend blood work and pt already with dermatology appointment. Recommended trying a different lotion/cream and r/o liver/kidney disease

## 2020-05-31 LAB — COMPREHENSIVE METABOLIC PANEL
ALT: 9 U/L (ref 0–35)
AST: 17 U/L (ref 0–37)
Albumin: 4.6 g/dL (ref 3.5–5.2)
Alkaline Phosphatase: 54 U/L (ref 39–117)
BUN: 15 mg/dL (ref 6–23)
CO2: 30 mEq/L (ref 19–32)
Calcium: 10 mg/dL (ref 8.4–10.5)
Chloride: 102 mEq/L (ref 96–112)
Creatinine, Ser: 0.88 mg/dL (ref 0.40–1.20)
GFR: 75.84 mL/min (ref >60.00)
Glucose, Bld: 106 mg/dL — ABNORMAL HIGH (ref 70–99)
Potassium: 4.1 mEq/L (ref 3.5–5.1)
Sodium: 138 mEq/L (ref 135–145)
Total Bilirubin: 0.5 mg/dL (ref 0.2–1.2)
Total Protein: 7 g/dL (ref 6.0–8.3)

## 2020-06-02 ENCOUNTER — Ambulatory Visit: Payer: Medicare Other | Admitting: Physical Therapy

## 2020-06-02 ENCOUNTER — Encounter: Payer: Self-pay | Admitting: Physical Therapy

## 2020-06-02 ENCOUNTER — Other Ambulatory Visit: Payer: Self-pay

## 2020-06-02 DIAGNOSIS — M25511 Pain in right shoulder: Secondary | ICD-10-CM | POA: Diagnosis not present

## 2020-06-02 DIAGNOSIS — M797 Fibromyalgia: Secondary | ICD-10-CM | POA: Diagnosis not present

## 2020-06-02 DIAGNOSIS — R262 Difficulty in walking, not elsewhere classified: Secondary | ICD-10-CM | POA: Diagnosis not present

## 2020-06-02 DIAGNOSIS — G8929 Other chronic pain: Secondary | ICD-10-CM

## 2020-06-02 DIAGNOSIS — M25512 Pain in left shoulder: Secondary | ICD-10-CM | POA: Diagnosis not present

## 2020-06-02 DIAGNOSIS — M6281 Muscle weakness (generalized): Secondary | ICD-10-CM | POA: Diagnosis not present

## 2020-06-02 NOTE — Therapy (Signed)
[facility] [address] [place], [address] Phone: [phone]   Fax:  [phone]  Physical Therapy Treatment  Patient Details  Name: Samantha [name] MRN: [id number] Date of Birth: 1945/10/04 Referring Provider (PT): Bo Merino, MD   Encounter Date: 06/02/2020   PT End of Session - 06/02/20 1043    Visit Number 4    Number of Visits 24    Date for PT Re-Evaluation 08/10/20    Authorization Type [employer] reporting period from 05/18/2020    Progress Note Due on Visit 10    PT Start Time 1035    PT Stop Time 1115    PT Time Calculation (min) 40 min    Activity Tolerance Patient limited by pain    Behavior During Therapy [facility] for tasks assessed/performed           Past Medical History:  Diagnosis Date  . ALLERGIC RHINITIS 07/26/2008  . ASTHMA 04/14/2007  . Asthma   . ASYMPTOMATIC POSTMENOPAUSAL STATUS 05/10/2008  . Bronchiectasis ([place])   . CHEST PAIN 08/26/2008  . COLONIC POLYPS, HX OF 04/14/2007   colonic leiomyoma  . Decreased grip strength   . Episcleritis   . FIBROIDS, UTERUS 05/10/2008  . Fibromyalgia   . Gait abnormality   . GERD 04/14/2007  . HYPERCHOLESTEROLEMIA 01/07/2008  . HYPERGLYCEMIA 11/28/2009  . HYPERTENSION 01/28/2008  . INSOMNIA 05/10/2008  . Irritable bowel syndrome 04/06/2010  . OSTEOARTHRITIS 11/28/2009  . 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 SINUSITIS 02/10/2009    Past Surgical History:  Procedure Laterality Date  . COLONOSCOPY W/ POLYPECTOMY    . ELECTROCARDIOGRAM  12/04/2006  . IR RADIOLOGY PERIPHERAL GUIDED IV START  04/21/2018  . IR US GUIDE VASC ACCESS LEFT  04/21/2018  . NASAL SEPTUM SURGERY    . Stress Cardiolite  02/13/2002    There were no vitals filed for this visit.   Subjective Assessment - 06/02/20 1040    Subjective Patient report she missed her last treatment session because she had a rash break out on Friday over her lower arms and bumps  over her face and neck. She went to the doctor who said she was not contagious. She remembered to take benerdryl and that helped. She thinks she had an allergic reaction to something. She had pain in her R UT region this morning but she massaged it and it feels better now. It currently hurts only near the right clavicle, 4/10 (difficulty rating). She used her exercise bike some yesterday but she is limited because her legs are too straight and that bothers them. She states she feels better and intergetic today.    Pertinent History Patient is a 74 y.o. female who presents to outpatient physical therapy with a referral for medical diagnosis primary osteoarthritis of both hands, primary osteoarthritis of both shoulders, fibromyalgia. This patient's chief complaints consist of widespread pain in neck, back, B shoulders, UE including hands, hips, LEs including feet leading to the following functional deficits: difficulty with ADLs, IADLs, carrying groceries, putting gate down on vehicle, sleeping, laying down, getting up from the chair, walking, tolerating activity, holding items, pushing, pulling, laundry, etc. Relevant past medical history and comorbidities include slightly enlarged heart, bronchiectasis, symptomatic asthma and allergies, gradual unintensional weight loss (10 lbs over years? Patient unclear), ovarian cysts and was referred to oncologist and supposed to have a scan (October), increased urgency for bladder recently, back pain with bulging disk, fibromyalgia, R knee meniscus tear  with surgery in 2013,  raynaud's, BIS, insomnia, GERD, hx left hip bursitis, gait abnormality, chest pain .  Patient denies hx of cancer, stroke, seizures, lung problem besides above, major cardiac events (except enlarged heart), diabetes, new onset stumbling or dropping things (does have chronic dropping things).    Limitations Lifting;Standing;Walking;House hold activities;Writing;Other (comment)   difficulty with ADLs,  IADLs, carrying groceries, putting gate down on vehicle, sleeping, laying down, getting up from the chair, walking, tolerating activity, holding items, pushing, pulling, laundry, etc.   Diagnostic tests 12/21/19 shoulder radiograph: "You have similar findings in both shoulders with arthritis at the joint where the collarbone attaches to the shoulder blade."    Patient Stated Goals to have no pain.    Currently in Pain? Yes    Pain Score 4     Pain Onset More than a month ago          TREATMENT: DOES have a Latex sensitivity  Therapeutic exercise:to centralize symptoms and improve ROM, strength, muscular endurance, and activity tolerance required for successful completion of functional activities. -NuStep level0 (1 per pt request, but returned to 0 due to pain)using bilateral upper and lower extremities. Seat setting 7/handle setting9. For improved extremity mobility, muscular endurance, and activity tolerance; and to induce the analgesic effect of aerobic exercise, stimulate improved joint nutrition, and prepare body structures and systems for following interventions. x10Minutes. Average SPM = 83.  Manual therapy: to reduce pain and tissue tension, improve range of motion, neuromodulation, in order to promote improved ability to complete functional activities. Supine with feet elevated for comfort:  - STM with trigger point release to bilateral UT and around B shoulders.  - joint mobilizations grade I-II distraction and AP from neutral caudal during abduction PROM at B GHJ for pain control and relaxation.  - PROM to B shoulders x 10 reps in comfortable range: flexion, abduction, ER, IR.   HOME EXERCISE PROGRAM Access Code: [email or web address] URL: [email or web address] Date: 05/23/2020 Prepared by: Rosita Kea  Exercises Sit to Stand without Arm Support - 1 x daily - 3 sets - 10 reps Row with band/cable - 1 x daily - 3 sets - 15 reps - blue band Shoulder Flexion Wall  Slide with Towel - 1 x daily - 2 sets - 10 reps Shoulder External Rotation with Resistance - 1 x daily - 3 sets - 10 reps - red or yellow band    PT Education - 06/02/20 1041    Education Details Exercise purpose/form. Self management techniques.    Person(s) Educated Patient    Methods Explanation;Demonstration;Tactile cues;Verbal cues    Comprehension Verbalized understanding;Returned demonstration;Verbal cues required;Tactile cues required;Need further instruction            PT Short Term Goals - 05/18/20 1739      PT SHORT TERM GOAL #1   Title Be independent with initial home exercise program for self-management of symptoms.    Baseline Initial HEP provided at IE (05/18/2020);    Time 2    Period Weeks    Status New             PT Long Term Goals - 05/18/20 1159      PT LONG TERM GOAL #1   Title Be independent with a long-term home exercise program for self-management of symptoms.    Baseline initial HEP provided at IE (05/18/2020);    Time 12    Period Weeks    Status --   TARGET DATE FOR ALL LONG TERM  GOALS: 08/10/2020     PT LONG TERM GOAL #2   Title Demonstrate improved FOTO score to equal or greater than 58 by visit #12 to demonstrate improvement in overall condition and self-reported functional ability.    Baseline 51 (05/18/2020);    Time 12    Period Weeks    Status New      PT LONG TERM GOAL #3   Title Patient will complete 5 Time Sit to Stand from 18 inch surface with no UE support in equal or less than 16 seconds to improve her ability to perform ADLs, transfers, and be able to carry things when standing up from her chair.    Baseline 20 seconds, BUE use on 18.5 inch plinth (05/18/2020);    Time 12    Period Weeks    Status New      PT LONG TERM GOAL #4   Title Patient will improve R grip strength by 5 lbs to improve her ability to use her R hand for holding and manipulating objects.    Baseline R: (47+44+35)/3 = 42 lbs (05/18/2020);    Time 12    Period  Weeks    Status New      PT LONG TERM GOAL #5   Title Patient will report improvement in ability to complete community, work and/or recreational activities with at least 50% less limitation due to current condition.    Baseline Functional Limitations: carrying groceries, putting gate down, difficulty sleeping, laying down, getting up from the chair, completing ADLs and IADLs, pushing, pulling, laundry (05/18/2020);    Time 12    Period Weeks    Status New      Additional Long Term Goals   Additional Long Term Goals Yes      PT LONG TERM GOAL #6   Title Improve B shoulder AROM flexion to equal or greater than 160 degrees without increased pain to improve ability to reach and complete ADLs such as dressing with less difficulty.    Baseline R = 140, L = 150 both painful (05/18/2020);    Time 12    Period Weeks    Status New                 Plan - 06/02/20 1133    Clinical Impression Statement Patient tolerated treatment well overall but still has increased pain when lifting arms overhead L > R. Improved tolerance for nustep and PROM exercises today. Patient would benefit from continued management of limiting condition by skilled physical therapist to address remaining impairments and functional limitations to work towards stated goals and return to PLOF or maximal functional independence.    Personal Factors and Comorbidities Age;Comorbidity 3+;Time since onset of injury/illness/exacerbation;Fitness;Past/Current Experience    Comorbidities Relevant past medical history and comorbidities include slightly enlarged heart, bronchiectasis, symptomatic asthma and allergies, gradual unintensional weight loss (10 lbs over years? Patient unclear), ovarian cysts and was referred to oncologist and supposed to have a scan (October), increased urgency for bladder recently, back pain with bulging disk, fibromyalgia, R knee meniscus tear with surgery in 2013,  raynaud's, IBS, insomnia, GERD, hx left hip  bursitis, gait abnormality, chest pain    Examination-Activity Limitations Bathing;Hygiene/Grooming;Squat;Lift;Stairs;Bed Mobility;Bend;Locomotion Level;Stand;Caring for Others;Reach Overhead;Carry;Transfers;Sit;Dressing;Sleep    Examination-Participation Restrictions Laundry;Cleaning;Community Activity;Interpersonal Relationship;Meal Prep;Shop    Stability/Clinical Decision Making Evolving/Moderate complexity    Rehab Potential Fair    PT Frequency 2x / week    PT Duration 12 weeks    PT Treatment/Interventions ADLs/Self Care Home  Management;Aquatic Therapy;Cryotherapy;Moist Heat;Electrical Stimulation;Gait training;Stair training;Functional mobility training;Therapeutic activities;Therapeutic exercise;Balance training;Neuromuscular re-education;Patient/family education;Manual techniques;Dry needling;Passive range of motion;Energy conservation;Spinal Manipulations;Joint Manipulations    PT Next Visit Plan assess response to HEP, graded exercise for improved strength and ROM    PT Home Exercise Plan Medbridge Access Code: [email or web address]    Consulted and Agree with Plan of Care Patient           Patient will benefit from skilled therapeutic intervention in order to improve the following deficits and impairments:  Abnormal gait, Pain, Decreased mobility, Decreased coordination, Decreased activity tolerance, Decreased endurance, Decreased range of motion, Decreased strength, Hypomobility, Impaired perceived functional ability, Impaired UE functional use, Impaired flexibility, Difficulty walking, Decreased balance  Visit Diagnosis: Fibromyalgia  Muscle weakness (generalized)  Difficulty in walking, not elsewhere classified  Chronic right shoulder pain  Chronic left shoulder pain     Problem List Patient Active Problem List   Diagnosis Date Noted  . Tinea versicolor 05/30/2020  . Pruritus 05/30/2020  . Shoulder pain 12/24/2019  . Myalgia 12/24/2019  . Bronchiectasis without complication  ([place]) 29/93/7169  . Dysphagia 07/23/2019  . Abdominal pain 07/23/2019  . Nonintractable headache 03/19/2019  . Paresthesia 03/19/2019  . Decreased grip strength 02/04/2019  . Clavicle pain 02/04/2019  . Itching 02/02/2019  . Local superficial swelling 02/02/2019  . Weight loss 02/02/2019  . Osteopenia 08/09/2018  . Chronic rhinitis 07/23/2018  . 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 Healthcare maintenance 07/16/2018  . Moderate persistent asthma with acute exacerbation 01/30/2018  . Advance care planning 01/02/2018  . Vitamin D deficiency 07/08/2017  . 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/19/2017  . Primary osteoarthritis of both knees 01/19/2017  . Primary osteoarthritis of both feet 01/19/2017  . Cough 11/14/2016  . SOB (shortness of breath) 11/14/2016  . Diverticulosis 03/19/2016  . Rash 10/03/2015  . Lumbar compression fracture ([place]) 06/20/2015  . Fibromyalgia 02/26/2013  . Allergic rhinitis due to pollen 11/20/2010  . 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 IRRITABLE BOWEL SYNDROME 04/06/2010  . THYROID NODULE, RIGHT 12/07/2009  . OSTEOARTHRITIS 11/28/2009  . FIBROIDS, UTERUS 05/10/2008  . Insomnia 05/10/2008  . ASYMPTOMATIC POSTMENOPAUSAL STATUS 05/10/2008  . Essential hypertension 01/28/2008  . HYPERCHOLESTEROLEMIA 01/07/2008  . GLAUCOMA 01/07/2008  . Gastroesophageal reflux disease 04/14/2007  . Asthma 04/14/2007    Everlean Alstrom. Graylon Good, PT, DPT 06/02/20, 11:34 AM  [facility] [address], [place], [address] Phone: [phone]   Fax:  [phone]  Name: Samantha [name] MRN: [id number] Date of Birth: 03/29/1946

## 2020-06-06 ENCOUNTER — Ambulatory Visit: Payer: Medicare Other | Admitting: Physical Therapy

## 2020-06-06 ENCOUNTER — Other Ambulatory Visit: Payer: Self-pay

## 2020-06-06 ENCOUNTER — Encounter: Payer: Self-pay | Admitting: Physical Therapy

## 2020-06-06 DIAGNOSIS — M6281 Muscle weakness (generalized): Secondary | ICD-10-CM

## 2020-06-06 DIAGNOSIS — R262 Difficulty in walking, not elsewhere classified: Secondary | ICD-10-CM

## 2020-06-06 DIAGNOSIS — G8929 Other chronic pain: Secondary | ICD-10-CM | POA: Diagnosis not present

## 2020-06-06 DIAGNOSIS — M797 Fibromyalgia: Secondary | ICD-10-CM | POA: Diagnosis not present

## 2020-06-06 DIAGNOSIS — M25512 Pain in left shoulder: Secondary | ICD-10-CM

## 2020-06-06 DIAGNOSIS — M25511 Pain in right shoulder: Secondary | ICD-10-CM | POA: Diagnosis not present

## 2020-06-06 NOTE — Therapy (Signed)
[facility] [address] [place], [address] Phone: [phone]   Fax:  [phone]  Physical Therapy Treatment  Patient Details  Name: Samantha [name] MRN: [id number] Date of Birth: 05/16/1946 Referring Provider (PT): Bo Merino, MD   Encounter Date: 06/06/2020   PT End of Session - 06/06/20 0955    Visit Number 5    Number of Visits 24    Date for PT Re-Evaluation 08/10/20    Authorization Type [employer]ICARE reporting period from 05/18/2020    Progress Note Due on Visit 10    PT Start Time 0950    PT Stop Time 1030    PT Time Calculation (min) 40 min    Activity Tolerance Patient limited by pain    Behavior During Therapy [facility] for tasks assessed/performed           Past Medical History:  Diagnosis Date  . ALLERGIC RHINITIS 07/26/2008  . ASTHMA 04/14/2007  . Asthma   . ASYMPTOMATIC POSTMENOPAUSAL STATUS 05/10/2008  . Bronchiectasis ([place])   . CHEST PAIN 08/26/2008  . COLONIC POLYPS, HX OF 04/14/2007   colonic leiomyoma  . Decreased grip strength   . Episcleritis   . FIBROIDS, UTERUS 05/10/2008  . Fibromyalgia   . Gait abnormality   . GERD 04/14/2007  . HYPERCHOLESTEROLEMIA 01/07/2008  . HYPERGLYCEMIA 11/28/2009  . HYPERTENSION 01/28/2008  . INSOMNIA 05/10/2008  . Irritable bowel syndrome 04/06/2010  . OSTEOARTHRITIS 11/28/2009  . 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 SINUSITIS 02/10/2009    Past Surgical History:  Procedure Laterality Date  . COLONOSCOPY W/ POLYPECTOMY    . ELECTROCARDIOGRAM  12/04/2006  . IR RADIOLOGY PERIPHERAL GUIDED IV START  04/21/2018  . IR US GUIDE VASC ACCESS LEFT  04/21/2018  . NASAL SEPTUM SURGERY    . Stress Cardiolite  02/13/2002    There were no vitals filed for this visit.   Subjective Assessment - 06/06/20 0953    Subjective Patient reports she felt fine following last session and has noticed she is walking a lot faster, which she attributes to  working on the nustep in PT. She reports 5/10 pain down her spine from the CT junction down. States her shoulders still hurt when lifting arms above head but are better overall.    Pertinent History Patient is a 74 y.o. female who presents to outpatient physical therapy with a referral for medical diagnosis primary osteoarthritis of both hands, primary osteoarthritis of both shoulders, fibromyalgia. This patient's chief complaints consist of widespread pain in neck, back, B shoulders, UE including hands, hips, LEs including feet leading to the following functional deficits: difficulty with ADLs, IADLs, carrying groceries, putting gate down on vehicle, sleeping, laying down, getting up from the chair, walking, tolerating activity, holding items, pushing, pulling, laundry, etc. Relevant past medical history and comorbidities include slightly enlarged heart, bronchiectasis, symptomatic asthma and allergies, gradual unintensional weight loss (10 lbs over years? Patient unclear), ovarian cysts and was referred to oncologist and supposed to have a scan (October), increased urgency for bladder recently, back pain with bulging disk, fibromyalgia, R knee meniscus tear with surgery in 2013,  raynaud's, BIS, insomnia, GERD, hx left hip bursitis, gait abnormality, chest pain .  Patient denies hx of cancer, stroke, seizures, lung problem besides above, major cardiac events (except enlarged heart), diabetes, new onset stumbling or dropping things (does have chronic dropping things).    Limitations Lifting;Standing;Walking;House hold activities;Writing;Other (comment)   difficulty with ADLs, IADLs,  carrying groceries, putting gate down on vehicle, sleeping, laying down, getting up from the chair, walking, tolerating activity, holding items, pushing, pulling, laundry, etc.   Diagnostic tests 12/21/19 shoulder radiograph: "You have similar findings in both shoulders with arthritis at the joint where the collarbone attaches to the  shoulder blade."    Patient Stated Goals to have no pain.    Currently in Pain? Yes    Pain Score 5     Pain Onset More than a month ago           TREATMENT: DOES have a Latex sensitivity  Therapeutic exercise:to centralize symptoms and improve ROM, strength, muscular endurance, and activity tolerance required for successful completion of functional activities. -NuStep level0 (1 per pt request, but returned to 0 due to pain)using bilateral upper and lower extremities. Seatsetting 7/handle setting9. For improved extremity mobility, muscular endurance, and activity tolerance; and to induce the analgesic effect of aerobic exercise, stimulate improved joint nutrition, and prepare body structures and systems for following interventions. x10Minutes. Average SPM = 100 - hooklying LTR x 20 - sidelying open book thoracic rotation x20 each side - Education on HEP including handout   Manual therapy:to reduce pain and tissue tension, improve range of motion, neuromodulation, in order to promote improved ability to complete functional activities. Prone:  - STM to bilateral lumbothoracic paraspinals  HOME EXERCISE PROGRAM Access Code: [email or web address] URL: [email or web address] Date: 06/06/2020 Prepared by: Rosita Kea  Exercises Sit to Stand without Arm Support - 1 x daily - 3 sets - 10 reps Row with band/cable - 1 x daily - 3 sets - 15 reps - blue band Shoulder Flexion Wall Slide with Towel - 1 x daily - 2 sets - 10 reps Shoulder External Rotation with Resistance - 1 x daily - 3 sets - 10 reps - red or yellow band Side Lying Open Book - 20 reps Supine Lower Trunk Rotation - 20 reps     PT Education - 06/06/20 0955    Education Details Exercise purpose/form. Self management techniques.    Person(s) Educated Patient    Methods Explanation;Demonstration;Tactile cues;Verbal cues    Comprehension Verbalized understanding;Returned demonstration;Verbal cues  required;Tactile cues required;Need further instruction            PT Short Term Goals - 05/18/20 1739      PT SHORT TERM GOAL #1   Title Be independent with initial home exercise program for self-management of symptoms.    Baseline Initial HEP provided at IE (05/18/2020);    Time 2    Period Weeks    Status New             PT Long Term Goals - 05/18/20 1159      PT LONG TERM GOAL #1   Title Be independent with a long-term home exercise program for self-management of symptoms.    Baseline initial HEP provided at IE (05/18/2020);    Time 12    Period Weeks    Status --   TARGET DATE FOR ALL LONG TERM GOALS: 08/10/2020     PT LONG TERM GOAL #2   Title Demonstrate improved FOTO score to equal or greater than 58 by visit #12 to demonstrate improvement in overall condition and self-reported functional ability.    Baseline 51 (05/18/2020);    Time 12    Period Weeks    Status New      PT LONG TERM GOAL #3   Title Patient will complete 5 Time Sit to Stand  from 18 inch surface with no UE support in equal or less than 16 seconds to improve her ability to perform ADLs, transfers, and be able to carry things when standing up from her chair.    Baseline 20 seconds, BUE use on 18.5 inch plinth (05/18/2020);    Time 12    Period Weeks    Status New      PT LONG TERM GOAL #4   Title Patient will improve R grip strength by 5 lbs to improve her ability to use her R hand for holding and manipulating objects.    Baseline R: (47+44+35)/3 = 42 lbs (05/18/2020);    Time 12    Period Weeks    Status New      PT LONG TERM GOAL #5   Title Patient will report improvement in ability to complete community, work and/or recreational activities with at least 50% less limitation due to current condition.    Baseline Functional Limitations: carrying groceries, putting gate down, difficulty sleeping, laying down, getting up from the chair, completing ADLs and IADLs, pushing, pulling, laundry (05/18/2020);     Time 12    Period Weeks    Status New      Additional Long Term Goals   Additional Long Term Goals Yes      PT LONG TERM GOAL #6   Title Improve B shoulder AROM flexion to equal or greater than 160 degrees without increased pain to improve ability to reach and complete ADLs such as dressing with less difficulty.    Baseline R = 140, L = 150 both painful (05/18/2020);    Time 12    Period Weeks    Status New                 Plan - 06/06/20 1955    Clinical Impression Statement Patient tolerated treatment well overall but did have some difficulty with spinal rotation exercises and bed mobility due to pain in the spine. Did report that the exercises also help relieve pain and stiffness there. Did report some relief from manual therapy as well. Continues to be limited by pain in various parts of her body, especially at the shoulders. Patient would benefit from continued management of limiting condition by skilled physical therapist to address remaining impairments and functional limitations to work towards stated goals and return to PLOF or maximal functional independence.    Personal Factors and Comorbidities Age;Comorbidity 3+;Time since onset of injury/illness/exacerbation;Fitness;Past/Current Experience    Comorbidities Relevant past medical history and comorbidities include slightly enlarged heart, bronchiectasis, symptomatic asthma and allergies, gradual unintensional weight loss (10 lbs over years? Patient unclear), ovarian cysts and was referred to oncologist and supposed to have a scan (October), increased urgency for bladder recently, back pain with bulging disk, fibromyalgia, R knee meniscus tear with surgery in 2013,  raynaud's, IBS, insomnia, GERD, hx left hip bursitis, gait abnormality, chest pain    Examination-Activity Limitations Bathing;Hygiene/Grooming;Squat;Lift;Stairs;Bed Mobility;Bend;Locomotion Level;Stand;Caring for Others;Reach Overhead;Carry;Transfers;Sit;Dressing;Sleep     Examination-Participation Restrictions Laundry;Cleaning;Community Activity;Interpersonal Relationship;Meal Prep;Shop    Stability/Clinical Decision Making Evolving/Moderate complexity    Rehab Potential Fair    PT Frequency 2x / week    PT Duration 12 weeks    PT Treatment/Interventions ADLs/Self Care Home Management;Aquatic Therapy;Cryotherapy;Moist Heat;Electrical Stimulation;Gait training;Stair training;Functional mobility training;Therapeutic activities;Therapeutic exercise;Balance training;Neuromuscular re-education;Patient/family education;Manual techniques;Dry needling;Passive range of motion;Energy conservation;Spinal Manipulations;Joint Manipulations    PT Next Visit Plan assess response to HEP, graded exercise for improved strength and ROM    PT Home  Exercise Plan Medbridge Access Code: [email or web address]    Consulted and Agree with Plan of Care Patient           Patient will benefit from skilled therapeutic intervention in order to improve the following deficits and impairments:  Abnormal gait, Pain, Decreased mobility, Decreased coordination, Decreased activity tolerance, Decreased endurance, Decreased range of motion, Decreased strength, Hypomobility, Impaired perceived functional ability, Impaired UE functional use, Impaired flexibility, Difficulty walking, Decreased balance  Visit Diagnosis: Fibromyalgia  Muscle weakness (generalized)  Difficulty in walking, not elsewhere classified  Chronic right shoulder pain  Chronic left shoulder pain     Problem List Patient Active Problem List   Diagnosis Date Noted  . Tinea versicolor 05/30/2020  . Pruritus 05/30/2020  . Shoulder pain 12/24/2019  . Myalgia 12/24/2019  . Bronchiectasis without complication ([place]) 00/71/2197  . Dysphagia 07/23/2019  . Abdominal pain 07/23/2019  . Nonintractable headache 03/19/2019  . Paresthesia 03/19/2019  . Decreased grip strength 02/04/2019  . Clavicle pain 02/04/2019  . Itching 02/02/2019    . Local superficial swelling 02/02/2019  . Weight loss 02/02/2019  . Osteopenia 08/09/2018  . Chronic rhinitis 07/23/2018  . 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 Healthcare maintenance 07/16/2018  . Moderate persistent asthma with acute exacerbation 01/30/2018  . Advance care planning 01/02/2018  . Vitamin D deficiency 07/08/2017  . 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/19/2017  . Primary osteoarthritis of both knees 01/19/2017  . Primary osteoarthritis of both feet 01/19/2017  . Cough 11/14/2016  . SOB (shortness of breath) 11/14/2016  . Diverticulosis 03/19/2016  . Rash 10/03/2015  . Lumbar compression fracture ([place]) 06/20/2015  . Fibromyalgia 02/26/2013  . Allergic rhinitis due to pollen 11/20/2010  . 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 IRRITABLE BOWEL SYNDROME 04/06/2010  . THYROID NODULE, RIGHT 12/07/2009  . OSTEOARTHRITIS 11/28/2009  . FIBROIDS, UTERUS 05/10/2008  . Insomnia 05/10/2008  . ASYMPTOMATIC POSTMENOPAUSAL STATUS 05/10/2008  . Essential hypertension 01/28/2008  . HYPERCHOLESTEROLEMIA 01/07/2008  . GLAUCOMA 01/07/2008  . Gastroesophageal reflux disease 04/14/2007  . Asthma 04/14/2007    Everlean Alstrom. Graylon Good, PT, DPT 06/06/20, 7:56 PM  [facility] [address], [place], [address] Phone: [phone]   Fax:  [phone]  Name: Samantha [name] MRN: [id number] Date of Birth: 01/08/1946

## 2020-06-09 ENCOUNTER — Other Ambulatory Visit: Payer: Self-pay

## 2020-06-09 ENCOUNTER — Ambulatory Visit: Payer: Medicare Other | Admitting: Physical Therapy

## 2020-06-09 ENCOUNTER — Encounter: Payer: Self-pay | Admitting: Physical Therapy

## 2020-06-09 DIAGNOSIS — R262 Difficulty in walking, not elsewhere classified: Secondary | ICD-10-CM

## 2020-06-09 DIAGNOSIS — M6281 Muscle weakness (generalized): Secondary | ICD-10-CM | POA: Diagnosis not present

## 2020-06-09 DIAGNOSIS — G8929 Other chronic pain: Secondary | ICD-10-CM | POA: Diagnosis not present

## 2020-06-09 DIAGNOSIS — M797 Fibromyalgia: Secondary | ICD-10-CM

## 2020-06-09 DIAGNOSIS — M25512 Pain in left shoulder: Secondary | ICD-10-CM | POA: Diagnosis not present

## 2020-06-09 DIAGNOSIS — M25511 Pain in right shoulder: Secondary | ICD-10-CM | POA: Diagnosis not present

## 2020-06-09 NOTE — Therapy (Signed)
[facility] [address], [place], [address] Phone: [phone]   Fax:  [phone]  Physical Therapy Treatment  Patient Details  Name: Samantha [name] MRN: [id number] Date of Birth: [birth year] Referring Provider (PT): Bo Merino, MD   Encounter Date: 06/09/2020   PT End of Session - 06/09/20 1139    Visit Number 6    Number of Visits 24    Date for PT Re-Evaluation 08/10/20    Authorization Type [employer]ICARE reporting period from 05/18/2020    Progress Note Due on Visit 10    PT Start Time 1037    PT Stop Time 1115    PT Time Calculation (min) 38 min    Activity Tolerance Patient limited by pain    Behavior During Therapy [facility] for tasks assessed/performed           Past Medical History:  Diagnosis Date  . ALLERGIC RHINITIS 07/26/2008  . ASTHMA 04/14/2007  . Asthma   . ASYMPTOMATIC POSTMENOPAUSAL STATUS 05/10/2008  . Bronchiectasis ([place])   . CHEST PAIN 08/26/2008  . COLONIC POLYPS, HX OF 04/14/2007   colonic leiomyoma  . Decreased grip strength   . Episcleritis   . FIBROIDS, UTERUS 05/10/2008  . Fibromyalgia   . Gait abnormality   . GERD 04/14/2007  . HYPERCHOLESTEROLEMIA 01/07/2008  . HYPERGLYCEMIA 11/28/2009  . HYPERTENSION 01/28/2008  . INSOMNIA 05/10/2008  . Irritable bowel syndrome 04/06/2010  . OSTEOARTHRITIS 11/28/2009  . 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 SINUSITIS 02/10/2009    Past Surgical History:  Procedure Laterality Date  . COLONOSCOPY W/ POLYPECTOMY    . ELECTROCARDIOGRAM  12/04/2006  . IR RADIOLOGY PERIPHERAL GUIDED IV START  04/21/2018  . IR US GUIDE VASC ACCESS LEFT  04/21/2018  . NASAL SEPTUM SURGERY    . Stress Cardiolite  02/13/2002    There were no vitals filed for this visit.   Subjective Assessment - 06/09/20 1044    Subjective Patient reports she is feeling tired today but has not pain. States she has been getting up and leaving the house early and not  getting home until late afternoon each day. She felt tired and her back was still hurting following last treatment session. She also had some tightness in her chest and made her acid reflux act up.    Pertinent History Patient is a 74 y.o. female who presents to outpatient physical therapy with a referral for medical diagnosis primary osteoarthritis of both hands, primary osteoarthritis of both shoulders, fibromyalgia. This patient's chief complaints consist of widespread pain in neck, back, B shoulders, UE including hands, hips, LEs including feet leading to the following functional deficits: difficulty with ADLs, IADLs, carrying groceries, putting gate down on vehicle, sleeping, laying down, getting up from the chair, walking, tolerating activity, holding items, pushing, pulling, laundry, etc. Relevant past medical history and comorbidities include slightly enlarged heart, bronchiectasis, symptomatic asthma and allergies, gradual unintensional weight loss (10 lbs over years? Patient unclear), ovarian cysts and was referred to oncologist and supposed to have a scan (October), increased urgency for bladder recently, back pain with bulging disk, fibromyalgia, R knee meniscus tear with surgery in 2013,  raynaud's, BIS, insomnia, GERD, hx left hip bursitis, gait abnormality, chest pain .  Patient denies hx of cancer, stroke, seizures, lung problem besides above, major cardiac events (except enlarged heart), diabetes, new onset stumbling or dropping things (does have chronic dropping things).    Limitations Lifting;Standing;Walking;House hold activities;Writing;Other (comment)  difficulty with ADLs, IADLs, carrying groceries, putting gate down on vehicle, sleeping, laying down, getting up from the chair, walking, tolerating activity, holding items, pushing, pulling, laundry, etc.   Diagnostic tests 12/21/19 shoulder radiograph: "You have similar findings in both shoulders with arthritis at the joint where the  collarbone attaches to the shoulder blade."    Patient Stated Goals to have no pain.    Currently in Pain? No/denies           TREATMENT: DOES have a Latex sensitivity  Therapeutic exercise:to centralize symptoms and improve ROM, strength, muscular endurance, and activity tolerance required for successful completion of functional activities. -NuStep level0 (1 per pt request, but returned to 0 due to pain)using bilateral upper and lower extremities. Seatsetting 7/handle setting9. For improved extremity mobility, muscular endurance, and activity tolerance; and to induce the analgesic effect of aerobic exercise, stimulate improved joint nutrition, and prepare body structures and systems for following interventions. x10Minutes. Average SPM =74 - standing rows with blue theraband, 2x15 - standing/seated bicep curls, 2x15 with 3# DB - seated lat lat pull with theraband anchored overhead, 2x15/20 - attempted step serratus press with blue then red theraband, 2x10 on R, x 5 on left before needing to stop due to pain.  - standing B shoulder ER, 2x15 red/yellow band -  modified push up against TM bar 2x15 - sit <> stand with no UE support from 18.5 inch chair, 2x10 (back pain starting). Modified to squeeze glutes more x2 with no improvement.   HOME EXERCISE PROGRAM Access Code: [email or web address] URL: [email or web address] Date: 06/06/2020 Prepared by: Rosita Kea  Exercises Sit to Stand without Arm Support - 1 x daily - 3 sets - 10 reps Row with band/cable - 1 x daily - 3 sets - 15 reps - blue band Shoulder Flexion Wall Slide with Towel - 1 x daily - 2 sets - 10 reps Shoulder External Rotation with Resistance - 1 x daily - 3 sets - 10 reps - red or yellow band Side Lying Open Book - 20 reps Supine Lower Trunk Rotation - 20 reps     PT Education - 06/09/20 1139    Education Details Exercise purpose/form. Self management techniques    Person(s) Educated Patient     Methods Explanation;Demonstration;Tactile cues;Verbal cues    Comprehension Verbalized understanding;Returned demonstration;Verbal cues required;Tactile cues required;Need further instruction            PT Short Term Goals - 05/18/20 1739      PT SHORT TERM GOAL #1   Title Be independent with initial home exercise program for self-management of symptoms.    Baseline Initial HEP provided at IE (05/18/2020);    Time 2    Period Weeks    Status New             PT Long Term Goals - 05/18/20 1159      PT LONG TERM GOAL #1   Title Be independent with a long-term home exercise program for self-management of symptoms.    Baseline initial HEP provided at IE (05/18/2020);    Time 12    Period Weeks    Status --   TARGET DATE FOR ALL LONG TERM GOALS: 08/10/2020     PT LONG TERM GOAL #2   Title Demonstrate improved FOTO score to equal or greater than 58 by visit #12 to demonstrate improvement in overall condition and self-reported functional ability.    Baseline 51 (05/18/2020);    Time 12    Period  Weeks    Status New      PT LONG TERM GOAL #3   Title Patient will complete 5 Time Sit to Stand from 18 inch surface with no UE support in equal or less than 16 seconds to improve her ability to perform ADLs, transfers, and be able to carry things when standing up from her chair.    Baseline 20 seconds, BUE use on 18.5 inch plinth (05/18/2020);    Time 12    Period Weeks    Status New      PT LONG TERM GOAL #4   Title Patient will improve R grip strength by 5 lbs to improve her ability to use her R hand for holding and manipulating objects.    Baseline R: (47+44+35)/3 = 42 lbs (05/18/2020);    Time 12    Period Weeks    Status New      PT LONG TERM GOAL #5   Title Patient will report improvement in ability to complete community, work and/or recreational activities with at least 50% less limitation due to current condition.    Baseline Functional Limitations: carrying groceries, putting gate  down, difficulty sleeping, laying down, getting up from the chair, completing ADLs and IADLs, pushing, pulling, laundry (05/18/2020);    Time 12    Period Weeks    Status New      Additional Long Term Goals   Additional Long Term Goals Yes      PT LONG TERM GOAL #6   Title Improve B shoulder AROM flexion to equal or greater than 160 degrees without increased pain to improve ability to reach and complete ADLs such as dressing with less difficulty.    Baseline R = 140, L = 150 both painful (05/18/2020);    Time 12    Period Weeks    Status New                 Plan - 06/09/20 1138    Clinical Impression Statement Patient tolerated treatment with mild difficulty due to pain at lateral arms and low back. Arm discomfort resolved with rest and exercises were modified to accommodate. Low back pain worse after 2nd set of sit <> stand so did not perform 3rd set planned. Patient motivated to complete exercises. Avoided supine position due to difficulty with reflux following last session. Patient continues to have decreased activity tolerance, pain and weakness. Patient would benefit from continued management of limiting condition by skilled physical therapist to address remaining impairments and functional limitations to work towards stated goals and return to PLOF or maximal functional independence.    Personal Factors and Comorbidities Age;Comorbidity 3+;Time since onset of injury/illness/exacerbation;Fitness;Past/Current Experience    Comorbidities Relevant past medical history and comorbidities include slightly enlarged heart, bronchiectasis, symptomatic asthma and allergies, gradual unintensional weight loss (10 lbs over years? Patient unclear), ovarian cysts and was referred to oncologist and supposed to have a scan (October), increased urgency for bladder recently, back pain with bulging disk, fibromyalgia, R knee meniscus tear with surgery in 2013,  raynaud's, IBS, insomnia, GERD, hx left hip  bursitis, gait abnormality, chest pain    Examination-Activity Limitations Bathing;Hygiene/Grooming;Squat;Lift;Stairs;Bed Mobility;Bend;Locomotion Level;Stand;Caring for Others;Reach Overhead;Carry;Transfers;Sit;Dressing;Sleep    Examination-Participation Restrictions Laundry;Cleaning;Community Activity;Interpersonal Relationship;Meal Prep;Shop    Stability/Clinical Decision Making Evolving/Moderate complexity    Rehab Potential Fair    PT Frequency 2x / week    PT Duration 12 weeks    PT Treatment/Interventions ADLs/Self Care Home Management;Aquatic Therapy;Cryotherapy;Moist Heat;Electrical Stimulation;Gait training;Stair training;Functional mobility  training;Therapeutic activities;Therapeutic exercise;Balance training;Neuromuscular re-education;Patient/family education;Manual techniques;Dry needling;Passive range of motion;Energy conservation;Spinal Manipulations;Joint Manipulations    PT Next Visit Plan assess response to HEP, graded exercise for improved strength and ROM    PT Home Exercise Plan Medbridge Access Code: [email or web address]    Consulted and Agree with Plan of Care Patient           Patient will benefit from skilled therapeutic intervention in order to improve the following deficits and impairments:  Abnormal gait, Pain, Decreased mobility, Decreased coordination, Decreased activity tolerance, Decreased endurance, Decreased range of motion, Decreased strength, Hypomobility, Impaired perceived functional ability, Impaired UE functional use, Impaired flexibility, Difficulty walking, Decreased balance  Visit Diagnosis: Fibromyalgia  Muscle weakness (generalized)  Difficulty in walking, not elsewhere classified  Chronic right shoulder pain  Chronic left shoulder pain     Problem List Patient Active Problem List   Diagnosis Date Noted  . Tinea versicolor 05/30/2020  . Pruritus 05/30/2020  . Shoulder pain 12/24/2019  . Myalgia 12/24/2019  . Bronchiectasis without complication  ([place]) 64/33/2951  . Dysphagia 07/23/2019  . Abdominal pain 07/23/2019  . Nonintractable headache 03/19/2019  . Paresthesia 03/19/2019  . Decreased grip strength 02/04/2019  . Clavicle pain 02/04/2019  . Itching 02/02/2019  . Local superficial swelling 02/02/2019  . Weight loss 02/02/2019  . Osteopenia 08/09/2018  . Chronic rhinitis 07/23/2018  . 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 Healthcare maintenance 07/16/2018  . Moderate persistent asthma with acute exacerbation 01/30/2018  . Advance care planning 01/02/2018  . Vitamin D deficiency 07/08/2017  . 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/19/2017  . Primary osteoarthritis of both knees 01/19/2017  . Primary osteoarthritis of both feet 01/19/2017  . Cough 11/14/2016  . SOB (shortness of breath) 11/14/2016  . Diverticulosis 03/19/2016  . Rash 10/03/2015  . Lumbar compression fracture ([place]) 06/20/2015  . Fibromyalgia 02/26/2013  . Allergic rhinitis due to pollen 11/20/2010  . 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 IRRITABLE BOWEL SYNDROME 04/06/2010  . THYROID NODULE, RIGHT 12/07/2009  . OSTEOARTHRITIS 11/28/2009  . FIBROIDS, UTERUS 05/10/2008  . Insomnia 05/10/2008  . ASYMPTOMATIC POSTMENOPAUSAL STATUS 05/10/2008  . Essential hypertension 01/28/2008  . HYPERCHOLESTEROLEMIA 01/07/2008  . GLAUCOMA 01/07/2008  . Gastroesophageal reflux disease 04/14/2007  . Asthma 04/14/2007    Everlean Alstrom. Graylon Good, PT, DPT 06/09/20, 11:42 AM  [facility] [address], [place], [address] Phone: [phone]   Fax:  [phone]  Name: Samantha [name] MRN: [id number] Date of Birth: 10/16/1945

## 2020-06-13 ENCOUNTER — Encounter: Payer: Medicare Other | Admitting: Physical Therapy

## 2020-06-13 DIAGNOSIS — R1909 Other intra-abdominal and pelvic swelling, mass and lump: Secondary | ICD-10-CM | POA: Diagnosis not present

## 2020-06-13 NOTE — Progress Notes (Signed)
Office Visit Note  Patient: Samantha [name]             Date of Birth: [birth year]           MRN: [id number]             PCP: Tonia Ghent, MD Referring: Tonia Ghent, MD Visit Date: 06/27/2020 Occupation: @GUAROCC @  Subjective:  Other (patient reports total body pain when she lays down)   History of Present Illness: Samantha [name] is a 74 y.o. female with history of osteoarthritis and fibromyalgia syndrome.  She states she currently has bronchitis and having some chest discomfort due to that.  She continues to have pain and discomfort in her bilateral hands, bilateral knee joints and her feet.  She also has generalized pain from fibromyalgia.  Her muscles are achy.  She states the pain gets worse when she is laying down.  Patient states that she has been having headaches for last 1 year.  She states that the headaches are all over her skull but these are sometimes around her left eye and the left temporal region.  She states her ophthalmologist did a sed rate which was normal.  She states these headaches persist.  She was also seen by Dr. Evelena Leyden in the past for headaches.  Activities of Daily Living:   Patient reports morning stiffness for  Less than 20 minutes.   Patient Reports nocturnal pain.  Difficulty dressing/grooming: Denies Difficulty climbing stairs: Reports Difficulty getting out of chair: Denies Difficulty using hands for taps, buttons, cutlery, and/or writing: Reports  Review of Systems  Constitutional: Positive for fatigue.  HENT: Positive for mouth dryness. Negative for mouth sores and nose dryness.   Eyes: Positive for pain, itching and dryness.  Respiratory: Positive for shortness of breath. Negative for difficulty breathing.   Cardiovascular: Negative for chest pain and palpitations.  Gastrointestinal: Positive for constipation. Negative for blood in stool and diarrhea.  Endocrine: Negative for increased urination.  Genitourinary: Negative for difficulty  urinating.  Musculoskeletal: Positive for arthralgias, joint pain, myalgias, morning stiffness, muscle tenderness and myalgias. Negative for joint swelling.  Skin: Positive for rash. Negative for color change.  Allergic/Immunologic: Positive for susceptible to infections.  Neurological: Positive for dizziness, numbness, headaches, parasthesias and weakness. Negative for memory loss.  Hematological: Positive for bruising/bleeding tendency.  Psychiatric/Behavioral: Negative for confusion.    PMFS History:  Patient Active Problem List   Diagnosis Date Noted  . Tinea versicolor 05/30/2020  . Pruritus 05/30/2020  . Shoulder pain 12/24/2019  . Myalgia 12/24/2019  . Bronchiectasis without complication ([place]) 40/06/2724  . Dysphagia 07/23/2019  . Abdominal pain 07/23/2019  . Nonintractable headache 03/19/2019  . Paresthesia 03/19/2019  . Decreased grip strength 02/04/2019  . Clavicle pain 02/04/2019  . Itching 02/02/2019  . Local superficial swelling 02/02/2019  . Weight loss 02/02/2019  . Osteopenia 08/09/2018  . Chronic rhinitis 07/23/2018  . 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 Healthcare maintenance 07/16/2018  . Moderate persistent asthma with acute exacerbation 01/30/2018  . Advance care planning 01/02/2018  . Vitamin D deficiency 07/08/2017  . 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/19/2017  . Primary osteoarthritis of both knees 01/19/2017  . Primary osteoarthritis of both feet 01/19/2017  . Cough 11/14/2016  . SOB (shortness of breath) 11/14/2016  . Diverticulosis 03/19/2016  . Rash 10/03/2015  . Lumbar compression fracture ([place]) 06/20/2015  . Fibromyalgia 02/26/2013  . Allergic rhinitis due to pollen 11/20/2010  .  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 IRRITABLE BOWEL SYNDROME 04/06/2010  . THYROID NODULE, RIGHT 12/07/2009  . OSTEOARTHRITIS 11/28/2009  . FIBROIDS, UTERUS 05/10/2008  . Insomnia 05/10/2008  . ASYMPTOMATIC  POSTMENOPAUSAL STATUS 05/10/2008  . Essential hypertension 01/28/2008  . HYPERCHOLESTEROLEMIA 01/07/2008  . GLAUCOMA 01/07/2008  . Gastroesophageal reflux disease 04/14/2007  . Asthma 04/14/2007    Past Medical History:  Diagnosis Date  . ALLERGIC RHINITIS 07/26/2008  . ASTHMA 04/14/2007  . Asthma   . ASYMPTOMATIC POSTMENOPAUSAL STATUS 05/10/2008  . Bronchiectasis ([place])   . CHEST PAIN 08/26/2008  . COLONIC POLYPS, HX OF 04/14/2007   colonic leiomyoma  . Decreased grip strength   . Episcleritis   . FIBROIDS, UTERUS 05/10/2008  . Fibromyalgia   . Gait abnormality   . GERD 04/14/2007  . HYPERCHOLESTEROLEMIA 01/07/2008  . HYPERGLYCEMIA 11/28/2009  . HYPERTENSION 01/28/2008  . INSOMNIA 05/10/2008  . Irritable bowel syndrome 04/06/2010  . OSTEOARTHRITIS 11/28/2009  . 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 SINUSITIS 02/10/2009    Family History  Problem Relation Age of Onset  . Diabetes Mother   . Hypertension Mother   . Glaucoma Mother   . Polymyositis Mother   . Heart disease Father        CHF  . Allergic rhinitis Father   . Asthma Father   . Other Father        sideoblastic anemia  . Allergic rhinitis Sister   . Asthma Sister   . Allergic rhinitis Brother   . Asthma Brother   . Prostate cancer Brother   . Allergic rhinitis Maternal Aunt   . Asthma Maternal Aunt   . Allergic rhinitis Paternal Aunt   . Asthma Paternal Aunt   . Breast cancer Cousin   . Colon cancer Cousin   . Cancer Neg Hx        No FH of Colon Cancer  . Angioedema Neg Hx   . Atopy Neg Hx   . Immunodeficiency Neg Hx   . Urticaria Neg Hx   . Eczema Neg Hx    Past Surgical History:  Procedure Laterality Date  . COLONOSCOPY W/ POLYPECTOMY    . ELECTROCARDIOGRAM  12/04/2006  . IR RADIOLOGY PERIPHERAL GUIDED IV START  04/21/2018  . IR US GUIDE VASC ACCESS LEFT  04/21/2018  . NASAL SEPTUM SURGERY    . Stress Cardiolite  02/13/2002   Social History   Social History Narrative   Lives alone (widowed 1998).    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      Does not have living will.  Does desire CPR.  Does not want life support for prolonged periods of time if futile.   2 sons and 1 daughter.        Right-handed.   No daily caffeine use.   Immunization History  Administered Date(s) Administered  . Fluad Quad(high Dose 65+) 05/07/2019, 05/24/2020  . Influenza Split 06/04/2011, 05/27/2012  . Influenza Whole 08/18/2008, 05/26/2010  . Influenza, High Dose Seasonal PF 05/22/2017, 06/11/2018  . Influenza,inj,Quad PF,6+ Mos 06/08/2013, 05/19/2014, 06/08/2015, 05/07/2016  . PFIZER SARS-COV-2 Vaccination 10/27/2019, 11/17/2019  . Pneumococcal Conjugate-13 03/17/2014  . Pneumococcal Polysaccharide-23 11/23/2009, 06/08/2015, 07/14/2018  . Td 11/09/2002  . Tdap 07/03/2013  . Zoster 06/17/2013  . Zoster Recombinat (Shingrix) 02/19/2019, 08/27/2019     Objective: Vital Signs: BP 136/74 (BP Location: Left Arm, Patient Position: Sitting, Cuff Size: Normal)   Pulse 71   Resp 15   Ht 5\' 2"  (1.575 m)  Wt 136 lb 6.4 oz (61.9 kg)   BMI 24.95 kg/m    Physical Exam Vitals and nursing note reviewed.  Constitutional:      Appearance: She is well-developed.  HENT:     Head: Normocephalic and atraumatic.  Eyes:     Conjunctiva/sclera: Conjunctivae normal.  Cardiovascular:     Rate and Rhythm: Normal rate and regular rhythm.     Heart sounds: Normal heart sounds.  Pulmonary:     Effort: Pulmonary effort is normal.     Breath sounds: Normal breath sounds.  Abdominal:     General: Bowel sounds are normal.     Palpations: Abdomen is soft.  Musculoskeletal:     Cervical back: Normal range of motion.  Lymphadenopathy:     Cervical: No cervical adenopathy.  Skin:    General: Skin is warm and dry.     Capillary Refill: Capillary refill takes less than 2 seconds.  Neurological:     Mental Status: She is alert and oriented to person, place, and time.  Psychiatric:        Behavior: Behavior  normal.      Musculoskeletal Exam: C-spine thoracic lumbar spine with good range of motion.  Shoulder joints, elbow joints, wrist joints, MCPs PIPs and DIPs with good range of motion with no synovitis.  Hip joints, knee joints, ankles, MTPs and PIPs with good range of motion with no synovitis.  She had generalized hyperalgesia.  She had generalized tenderness on her scalp.  She also had tenderness in the temporal region.  She had tenderness over TMJs.  CDAI Exam: CDAI Score: -- Patient Global: --; Provider Global: -- Swollen: --; Tender: -- Joint Exam 06/27/2020   No joint exam has been documented for this visit   There is currently no information documented on the homunculus. Go to the Rheumatology activity and complete the homunculus joint exam.  Investigation: No additional findings.  Imaging: DG Chest 2 View  Result Date: 06/25/2020 CLINICAL DATA:  Chronic bronchitis EXAM: CHEST - 2 VIEW COMPARISON:  04/23/2019 FINDINGS: The heart size and mediastinal contours are within normal limits. Diffuse bilateral interstitial pulmonary opacity. Disc degenerative disease of the thoracic spine. IMPRESSION: Diffuse bilateral interstitial pulmonary opacity, consistent with edema or infection. No focal airspace opacity. Electronically Signed   By: Eddie Candle M.D.   On: 06/25/2020 15:51    Recent Labs: Lab Results  Component Value Date   WBC 6.4 03/10/2019   HGB 13.7 03/10/2019   PLT 212 03/10/2019   NA 138 05/30/2020   K 4.1 05/30/2020   CL 102 05/30/2020   CO2 30 05/30/2020   GLUCOSE 106 (H) 05/30/2020   BUN 15 05/30/2020   CREATININE 0.88 05/30/2020   BILITOT 0.5 05/30/2020   ALKPHOS 54 05/30/2020   AST 17 05/30/2020   ALT 9 05/30/2020   PROT 7.0 05/30/2020   ALBUMIN 4.6 05/30/2020   CALCIUM 10.0 05/30/2020   GFRAA >60 03/10/2019    Speciality Comments: No specialty comments available.  Procedures:  No procedures performed Allergies: Cefuroxime axetil, Doxycycline,  Latex, Lisinopril, Lovastatin, and Sulfonamide derivatives   Assessment / Plan:     Visit Diagnoses: Primary osteoarthritis of both hands-she complains of pain and stiffness in her hands.  No synovitis was noted.  Joint protection was discussed.  Bilateral carpal tunnel syndrome-currently not symptomatic.  Primary osteoarthritis of both shoulders - Bilateral acromioclavicular arthritis.  She has discomfort off and on.  Primary osteoarthritis of both hips-she had fairly good range of  motion without discomfort.  Primary osteoarthritis of both knees-no  swelling or effusion was noted.  Primary osteoarthritis of both feet-she complains of discomfort in her bilateral feet.  Headache around the eyes -she complains of headaches going on for 1 year.  She was evaluated by Dr. Evelena Leyden in the past.  She complains of discomfort all over her scalp in the temporal region and TMJ area.  She has been also seeing her ophthalmologist on a regular basis.  We contacted Dr. Kellie Simmering office and they will contact patient to schedule an appointment.  Plan: Sedimentation rate  Fibromyalgia-she has generalized pain, positive tender points and hyperalgesia.  Other fatigue-secondary to insomnia and fibromyalgia.  Other insomnia-good sleep hygiene was discussed.  History of vertebral compression fracture - 08/04/18 DXA T-1.1.  Patient is aware of osteopenia.  Use of calcium, vitamin D and exercises were discussed.  She will be discussing repeat DEXA with her PCP.  History of vitamin D deficiency-she is on vitamin D supplement.  History of asthma  History of kidney stones  History of glaucoma  History of IBS  History of diverticulosis  History of gastroesophageal reflux (GERD)  History of hypertension  History of hyperlipidemia  Educated about COVID-19 virus infection-she is fully vaccinated against COVID-19.  Advised her to get a booster.  Use of monoclonal antibodies were discussed in case she gets COVID-19  infection.  Use of mask, social distancing and hand hygiene was discussed.  Orders: Orders Placed This Encounter  Procedures  . Sedimentation rate   No orders of the defined types were placed in this encounter.   Follow-Up Instructions: Return in about 2 weeks (around 07/11/2020) for Osteoarthritis.   Bo Merino, MD  Note - This record has been created using Editor, commissioning.  Chart creation errors have been sought, but may not always  have been located. Such creation errors do not reflect on  the standard of medical care.

## 2020-06-14 ENCOUNTER — Ambulatory Visit: Payer: Medicare Other | Admitting: Physical Therapy

## 2020-06-16 ENCOUNTER — Other Ambulatory Visit: Payer: Self-pay

## 2020-06-16 ENCOUNTER — Other Ambulatory Visit: Payer: Self-pay | Admitting: *Deleted

## 2020-06-16 ENCOUNTER — Ambulatory Visit: Payer: Medicare Other | Admitting: Physical Therapy

## 2020-06-16 ENCOUNTER — Other Ambulatory Visit: Payer: Medicare Other

## 2020-06-16 ENCOUNTER — Telehealth: Payer: Self-pay | Admitting: Family Medicine

## 2020-06-16 DIAGNOSIS — Z20822 Contact with and (suspected) exposure to covid-19: Secondary | ICD-10-CM

## 2020-06-16 MED ORDER — PANTOPRAZOLE SODIUM 40 MG PO TBEC: 40.0000 mg | DELAYED_RELEASE_TABLET | Freq: Two times a day (BID) | ORAL | 3 refills | Status: DC

## 2020-06-16 MED ORDER — QNASL 80 MCG/ACT NA AERS: 2.0000 | INHALATION_SPRAY | Freq: Every day | NASAL | 3 refills | Status: DC

## 2020-06-16 NOTE — Telephone Encounter (Signed)
Medication Refill - Medication:  pantoprazole (PROTONIX) 40 MG tablet Beclomethasone Dipropionate (QNASL) 80 MCG/ACT AERS  Has the patient contacted their pharmacy?  No advised to call office.   Preferred Pharmacy (with phone number or street name):  [facility], [place] Phone:  [phone]  Fax:  [phone]

## 2020-06-20 ENCOUNTER — Telehealth: Payer: Self-pay | Admitting: Physical Therapy

## 2020-06-20 ENCOUNTER — Ambulatory Visit: Payer: Medicare Other | Admitting: Physical Therapy

## 2020-06-20 ENCOUNTER — Telehealth: Payer: Self-pay | Admitting: Internal Medicine

## 2020-06-20 LAB — NOVEL CORONAVIRUS, NAA: SARS-CoV-2, NAA: NOT DETECTED

## 2020-06-20 NOTE — Telephone Encounter (Signed)
Called and spoke with pt letting her know that CY wants her to come in for an appt and she verbalized understanding. Pt has been scheduled for an appt with CY Thurs 10/14. Nothing further needed.

## 2020-06-20 NOTE — Telephone Encounter (Signed)
Forgot to mark urgent

## 2020-06-20 NOTE — Telephone Encounter (Signed)
Primary Pulmonologist: Young Last office visit and with whom: 08/27/19 with Young What do we see them for (pulmonary problems): asthma Last OV assessment/plan: Instructions    Return in about 1 year (around 08/26/2020). Okay to get your second shingles shot  I think you have had tinea, a mild skin fungus infection. It might help to use Head And Shoulders shampoo as a body wash for a month. You can also try using one of the otc skin fungus ointments like Tinactin, but you need to follow the directions on the box.  Script sent for Zpak antibiotic to see if it helps with the cough  Script sent for tessalon perles for cough  Please call if we can help      Was appointment offered to patient (explain)?  Pt wants recommendations   Reason for call: Called and spoke with pt who states she has pain in her ears, tightness in chest and increased SOB, congestion, and chest discomfort. Pt also has a headache due to sinuses. Pt had a covid test performed 10/7 and found out that the results came back negative today 10/11.  Pt is using her Symbicort inhaler twice daily as prescribed. Pt has had to use her albuterol inhaler at least 2-3 times daily. Pt is taking all other meds as prescribed.  Pt denies any complaints of fever as last temp was 98.4.  Pt also states she has had a productive cough with thick clear to white phlegm. Pt denies any complaints of wheezing.   Allergies  Allergen Reactions  . Cefuroxime Axetil     REACTION: pt not sure of reaction- she can tolerate amoxil  . Doxycycline Itching  . Latex Itching  . Lisinopril     Cough  . Lovastatin Other (See Comments)    Muscle aches   . Sulfonamide Derivatives     REACTION: pt not sure of reaction    Immunization History  Administered Date(s) Administered  . Fluad Quad(high Dose 65+) 05/07/2019, 05/24/2020  . Influenza Split 06/04/2011, 05/27/2012  . Influenza Whole 08/18/2008, 05/26/2010  . Influenza, High Dose  Seasonal PF 05/22/2017, 06/11/2018  . Influenza,inj,Quad PF,6+ Mos 06/08/2013, 05/19/2014, 06/08/2015, 05/07/2016  . PFIZER SARS-COV-2 Vaccination 10/27/2019, 11/17/2019  . Pneumococcal Conjugate-13 03/17/2014  . Pneumococcal Polysaccharide-23 11/23/2009, 06/08/2015, 07/14/2018  . Td 11/09/2002  . Tdap 07/03/2013  . Zoster 06/17/2013  . Zoster Recombinat (Shingrix) 02/19/2019, 08/27/2019    Current Outpatient Medications:  .  acetaminophen (TYLENOL) 500 MG tablet, Take 1,000 mg by mouth every 6 (six) hours as needed for mild pain or headache., Disp: , Rfl:  .  albuterol (VENTOLIN HFA) 108 (90 Base) MCG/ACT inhaler, Inhale 2 puffs into the lungs every 4 (four) hours as needed for wheezing or shortness of breath., Disp: 18 g, Rfl: 3 .  Azelastine HCl 0.15 % SOLN, Place 2 sprays into both nostrils 2 (two) times daily as needed (runny nose)., Disp: 90 mL, Rfl: 2 .  Beclomethasone Dipropionate (QNASL) 80 MCG/ACT AERS, Place 2 sprays into both nostrils daily. Two sprays into both nostrils daily, Disp: 10.6 g, Rfl: 3 .  benzonatate (TESSALON) 200 MG capsule, Take 1 capsule (200 mg total) by mouth 3 (three) times daily as needed for cough., Disp: 30 capsule, Rfl: 1 .  bifidobacterium infantis (ALIGN) capsule, Take 1 capsule by mouth daily.  , Disp: , Rfl:  .  budesonide-formoterol (SYMBICORT) 160-4.5 MCG/ACT inhaler, Inhale 2 puffs into the lungs 2 (two) times daily., Disp: 3 Inhaler, Rfl: 3 .  cetirizine (  ZYRTEC) 10 MG tablet, Take 10 mg by mouth at bedtime. , Disp: , Rfl:  .  Cholecalciferol (VITAMIN D) 50 MCG (2000 UT) CAPS, Take 1 capsule by mouth daily., Disp: , Rfl:  .  losartan-hydrochlorothiazide (HYZAAR) 100-12.5 MG tablet, Take 1 tablet by mouth daily., Disp: 90 tablet, Rfl: 3 .  Magnesium Malate POWD, (1250mg  tabs) 1 tablet by mouth qhs, Disp: , Rfl:  .  Manganese 50 MG TABS, Take 50 mg by mouth at bedtime. , Disp: , Rfl:  .  Misc Natural Products (TART CHERRY ADVANCED PO), Take 2 tablets  by mouth daily. , Disp: , Rfl:  .  montelukast (SINGULAIR) 10 MG tablet, Take 1 tablet (10 mg total) by mouth at bedtime., Disp: 90 tablet, Rfl: 3 .  Olopatadine HCl (PATADAY) 0.2 % SOLN, Place 1 drop into both eyes daily as needed (itchy eyes)., Disp: , Rfl:  .  pantoprazole (PROTONIX) 40 MG tablet, Take 1 tablet (40 mg total) by mouth 2 (two) times daily., Disp: 180 tablet, Rfl: 3 .  Polyethyl Glycol-Propyl Glycol (SYSTANE) 0.4-0.3 % GEL ophthalmic gel, Place 1 application into both eyes every 6 (six) hours as needed (dry eyes)., Disp: , Rfl:  .  terbinafine (LAMISIL) 1 % cream, Apply 1 application topically 2 (two) times daily., Disp: 30 g, Rfl: 0 .  thiamine (VITAMIN B-1) 50 MG tablet, Take 100 mg by mouth daily., Disp: , Rfl:  .  TURMERIC PO, Take 1 tablet by mouth daily. , Disp: , Rfl:

## 2020-06-20 NOTE — Telephone Encounter (Signed)
Called patient back at her request after she canceled her appointments this week due to still being sick. Patient answered and said she believes she has an upper respiratory infection that she usually gets about this time of year every year. She just got back her COVID19 test results and they were negative. She has a call in to her doctor but states it usually takes 2-3 weeks to clear her infection each year and she cannot come to PT while feeling this sick. Decided to cancel appointments next week and agreed to make more appointments in November. Updated admin staff.   Everlean Alstrom. Graylon Good, PT, DPT 06/20/20, 10:09 AM

## 2020-06-20 NOTE — Telephone Encounter (Signed)
Please see if she can be worked in with me or an APP this week

## 2020-06-23 ENCOUNTER — Other Ambulatory Visit: Payer: Self-pay

## 2020-06-23 ENCOUNTER — Ambulatory Visit (INDEPENDENT_AMBULATORY_CARE_PROVIDER_SITE_OTHER): Payer: Medicare Other | Admitting: Internal Medicine

## 2020-06-23 ENCOUNTER — Encounter: Payer: Self-pay | Admitting: Internal Medicine

## 2020-06-23 ENCOUNTER — Ambulatory Visit (INDEPENDENT_AMBULATORY_CARE_PROVIDER_SITE_OTHER): Payer: Medicare Other

## 2020-06-23 ENCOUNTER — Encounter: Payer: Medicare Other | Admitting: Physical Therapy

## 2020-06-23 VITALS — BP 118/60 | HR 78 | Temp 98.0°F | Ht 62.0 in | Wt 135.2 lb

## 2020-06-23 DIAGNOSIS — J310 Chronic rhinitis: Secondary | ICD-10-CM | POA: Diagnosis not present

## 2020-06-23 DIAGNOSIS — J4541 Moderate persistent asthma with (acute) exacerbation: Secondary | ICD-10-CM | POA: Diagnosis not present

## 2020-06-23 DIAGNOSIS — Z8709 Personal history of other diseases of the respiratory system: Secondary | ICD-10-CM | POA: Diagnosis not present

## 2020-06-23 DIAGNOSIS — R918 Other nonspecific abnormal finding of lung field: Secondary | ICD-10-CM | POA: Diagnosis not present

## 2020-06-23 DIAGNOSIS — M5134 Other intervertebral disc degeneration, thoracic region: Secondary | ICD-10-CM | POA: Diagnosis not present

## 2020-06-23 MED ORDER — METHYLPREDNISOLONE ACETATE 80 MG/ML IJ SUSP
80.0000 mg | Freq: Once | INTRAMUSCULAR | Status: AC
  Administered 2020-06-23: 80 mg via INTRAMUSCULAR

## 2020-06-23 MED ORDER — AZELASTINE HCL 0.15 % NA SOLN
2.0000 | Freq: Two times a day (BID) | NASAL | 4 refills | Status: DC | PRN
Start: 2020-06-23 — End: 2021-04-10

## 2020-06-23 MED ORDER — AZITHROMYCIN 250 MG PO TABS: ORAL_TABLET | ORAL | 0 refills | Status: DC

## 2020-06-23 NOTE — Progress Notes (Signed)
Patient ID: Samantha [name], female    DOB: December 17, 1945, 74 y.o.   MRN: [id number]  HPI female never smoker followed for allergic rhinitis, asthma, bronchiectasis,  complicated by GERD/ LPR, Raynaud's, HBP, IBS, Glaucoma , Aortic Atherosclerosis,  Barium swallow 01/09/2016-normal PFT 06/05/2019- Mild restriction, normal flows and Diffusion -------------------------------------------------------------------------------------- 08/27/2019- 74 year old female never smoker followed for allergic rhinitis, asthma, bronchiectasis, complicated by GERD/ LPR, Raynaud's, HBP, IBS, Glaucoma Albuterol hfa, azelastine nasal, Qnasl, Symbicort 160, singulair, Zyrtec,  Protonix, Carafate,  -----c/o stable sob with exertion, sometimes prod cough with white mucus.  Persistent cough, white foamy sputum. Worse lying down. Adjustable bed helps but must get elevation right to avoid mid thoracic back ache.  "Achey tired" mid chest sensation relieved by albuterol. Nothing acute or progressive.  Discussed mild bronchiectasis on CT.  PFT 06/05/2019- Mild restriction, normal flows and Diffusion CT chest 05/20/2019- IMPRESSION: 1. There is a mild cylindrical bronchiectasis in both lower lobes along with mild bibasilar scarring, but no current airway thickening or additional specific cause for the patient's persistent cough is identified. 2. Aortic Atherosclerosis (ICD10-I70.0). Mild cardiomegaly. Small inferior pericardial effusion. 3. Sludge versus gallstones in the gallbladder. 4. A small lesion of the posteromedial spleen was present in 2018 although no has a faint marginal calcification. Highly likely to be Benign.  06/23/20-  74 year old female never smoker followed for allergic rhinitis, asthma, bronchiectasis, complicated by GERD/ LPR, Raynaud's, HBP, IBS, Glaucoma Albuterol hfa, azelastine nasal, Qnasl, Symbicort 160, singulair, Zyrtec,  Protonix, Carafate,  Covid vax-2 Phizer Flu vax- Had -----c/o headache  , cough (mostly non prod), increased sob for past 2 weeks - Had negative covid test 06/16/20 Increased cough, head congestion, SOB over past 2 weeks. No fever or purulent.  She got flu vax, then 2 days later had a rash, which cleared, then the cough and congestion. She asked about a depo shot. 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  Going to Physical Therapy for back and shoulders.   Review of Systems-see HPI   + = positive Constitutional:   No weight loss, night sweats,  Fevers, chills, fatigue, lassitude. HEENT:   No headaches,  Difficulty swallowing,  Tooth/dental problems,  Sore throat,                No sneezing, itching, or ear ache,   +nasal congestion, post nasal drip, CV:  + chest pain, orthopnea, PND, swelling in lower extremities, anasarca, dizziness, palpitations GI  No heartburn, indigestion, abdominal pain, nausea, vomiting,  Resp: No shortness of breath with exertion or at rest.  No excess mucus,+ productive cough,                                  +-non-productive cough,  No coughing up of blood.  No change in color of mucus.  +infrequent wheezing.   Skin: Clear GU:  MS:  No joint pain or swelling.   Psych:  No change in mood or affect. No depression or anxiety.  No memory loss.   Objective:   Physical Exam General- Alert, Oriented, Affect-appropriate, Distress- none acute; pleasant  Skin- + mild tinea R axilla Lymphadenopathy- none Head- atraumatic            Eyes- Gross vision intact, PERRLA, conjunctivae clear secretions,            Ears- Normal hearing  Nose- clear, no-Septal dev, mucus, polyps, erosion, perforation .                        Throat- Malampatti III.   TMs not  retracted , mucosa not red , drainage- none,                     tonsils- atrophic;  + hoarse Neck- flexible , trachea midline, no stridor , thyroid nl, carotid no bruit Chest - symmetrical excursion , unlabored           Heart/CV- RRR , no murmur , no  gallop  , no rub, nl s1 s2                           - JVD- none , edema- none, stasis changes- none, varices- none           Lung-  wheeze or rhonchi,-none, cough+ mild dry , dullness-none, rub- none           Chest wall-  Abd-  Br/ Gen/ Rectal- Not done, not indicated Extrem- cyanosis- none, clubbing, none, atrophy- none, strength- nl.  Neuro- grossly intact to observation

## 2020-06-23 NOTE — Assessment & Plan Note (Signed)
Mild asthmatic bronchitis with exacerbation. Nonspecific. Known mild bronchiectasis. Plan- CXR, ZPak, depomedrol

## 2020-06-23 NOTE — Assessment & Plan Note (Signed)
Somewhat worse. Can't excude mild sinusitis Plan- Z pak, refill azelastine

## 2020-06-23 NOTE — Patient Instructions (Addendum)
Script for Zpakl sent to drug store  Refill sent for Azelastine nasal spray  Order- CXR   Dx exacerbation chronic bronchitis  Order- depo 80  exacerb chronic bronchitis

## 2020-06-27 ENCOUNTER — Telehealth: Payer: Self-pay

## 2020-06-27 ENCOUNTER — Ambulatory Visit (INDEPENDENT_AMBULATORY_CARE_PROVIDER_SITE_OTHER): Payer: Medicare Other | Admitting: Rheumatology

## 2020-06-27 ENCOUNTER — Telehealth: Payer: Self-pay | Admitting: Neurology

## 2020-06-27 ENCOUNTER — Encounter: Payer: Self-pay | Admitting: Rheumatology

## 2020-06-27 ENCOUNTER — Other Ambulatory Visit: Payer: Self-pay

## 2020-06-27 VITALS — BP 136/74 | HR 71 | Resp 15 | Ht 62.0 in | Wt 136.4 lb

## 2020-06-27 DIAGNOSIS — M797 Fibromyalgia: Secondary | ICD-10-CM

## 2020-06-27 DIAGNOSIS — Z87442 Personal history of urinary calculi: Secondary | ICD-10-CM

## 2020-06-27 DIAGNOSIS — M19041 Primary osteoarthritis, right hand: Secondary | ICD-10-CM | POA: Diagnosis not present

## 2020-06-27 DIAGNOSIS — M19072 Primary osteoarthritis, left ankle and foot: Secondary | ICD-10-CM

## 2020-06-27 DIAGNOSIS — M19071 Primary osteoarthritis, right ankle and foot: Secondary | ICD-10-CM

## 2020-06-27 DIAGNOSIS — M19011 Primary osteoarthritis, right shoulder: Secondary | ICD-10-CM | POA: Diagnosis not present

## 2020-06-27 DIAGNOSIS — Z8709 Personal history of other diseases of the respiratory system: Secondary | ICD-10-CM

## 2020-06-27 DIAGNOSIS — G5603 Carpal tunnel syndrome, bilateral upper limbs: Secondary | ICD-10-CM | POA: Diagnosis not present

## 2020-06-27 DIAGNOSIS — M160 Bilateral primary osteoarthritis of hip: Secondary | ICD-10-CM | POA: Diagnosis not present

## 2020-06-27 DIAGNOSIS — G4709 Other insomnia: Secondary | ICD-10-CM

## 2020-06-27 DIAGNOSIS — R5383 Other fatigue: Secondary | ICD-10-CM

## 2020-06-27 DIAGNOSIS — Z7189 Other specified counseling: Secondary | ICD-10-CM

## 2020-06-27 DIAGNOSIS — M19042 Primary osteoarthritis, left hand: Secondary | ICD-10-CM

## 2020-06-27 DIAGNOSIS — Z8719 Personal history of other diseases of the digestive system: Secondary | ICD-10-CM

## 2020-06-27 DIAGNOSIS — Z8639 Personal history of other endocrine, nutritional and metabolic disease: Secondary | ICD-10-CM

## 2020-06-27 DIAGNOSIS — M170 Bilateral primary osteoarthritis of knee: Secondary | ICD-10-CM | POA: Diagnosis not present

## 2020-06-27 DIAGNOSIS — Z8679 Personal history of other diseases of the circulatory system: Secondary | ICD-10-CM

## 2020-06-27 DIAGNOSIS — M19012 Primary osteoarthritis, left shoulder: Secondary | ICD-10-CM

## 2020-06-27 DIAGNOSIS — R519 Headache, unspecified: Secondary | ICD-10-CM

## 2020-06-27 DIAGNOSIS — Z8669 Personal history of other diseases of the nervous system and sense organs: Secondary | ICD-10-CM

## 2020-06-27 DIAGNOSIS — Z8781 Personal history of (healed) traumatic fracture: Secondary | ICD-10-CM

## 2020-06-27 NOTE — Telephone Encounter (Signed)
-----   Message from Deneise Lever, MD sent at 06/26/2020  8:16 PM EDT ----- CXR- lung markings are a little thickened. Bronchitis could do this. We will follow/

## 2020-06-27 NOTE — Progress Notes (Signed)
Office Visit Note  Patient: Samantha [name]             Date of Birth: 11-08-1945           MRN: [id number]             PCP: Tonia Ghent, MD Referring: Tonia Ghent, MD Visit Date: 07/11/2020 Occupation: @GUAROCC @  Subjective:  Headaches   History of Present Illness: Samantha [name] is a 74 y.o. female with history of osteoarthritis and fibromyalgia.  Patient was last evaluated in our office on 06/27/2020.  At the time she was experiencing tenderness over the left temporal artery as well as frequent headaches and some vision changes.  She was seen urgently by Dr. Krista Blue on 06/28/2020 for further evaluation of possible temporal arteritis.  Her sed rate was within normal limits on 06/27/2020.  Her headaches have become less severe and less frequent.  According to the patient Dr. Krista Blue suggested starting on Cymbalta but she is apprehensive of potential side effects and does not plan to start it at this time.  She would like to continue taking natural anti-inflammatories.  She states that her generalized pain due to fibromyalgia has improved since starting to go to physical therapy.  She denies any increased joint pain or joint swelling at this time.   Activities of Daily Living:  Patient reports morning stiffness for 5 minutes.   Patient Reports nocturnal pain.  Difficulty dressing/grooming: Denies Difficulty climbing stairs: Reports Difficulty getting out of chair: Reports Difficulty using hands for taps, buttons, cutlery, and/or writing: Reports  Review of Systems  Constitutional: Positive for fatigue.  HENT: Positive for mouth dryness. Negative for mouth sores and nose dryness.   Eyes: Positive for dryness. Negative for pain and visual disturbance.  Respiratory: Positive for shortness of breath. Negative for cough, hemoptysis and difficulty breathing.   Cardiovascular: Positive for swelling in legs/feet. Negative for chest pain, palpitations and hypertension.  Gastrointestinal:  Positive for constipation. Negative for blood in stool and diarrhea.  Endocrine: Negative for increased urination.  Genitourinary: Negative for difficulty urinating and painful urination.  Musculoskeletal: Positive for arthralgias, joint pain, joint swelling, muscle weakness, morning stiffness and muscle tenderness. Negative for myalgias and myalgias.  Skin: Negative for color change, pallor, rash, hair loss, nodules/bumps, skin tightness, ulcers and sensitivity to sunlight.  Neurological: Negative for dizziness and headaches.  Hematological: Negative for swollen glands.  Psychiatric/Behavioral: Positive for sleep disturbance. Negative for depressed mood. The patient is not nervous/anxious.     PMFS History:  Patient Active Problem List   Diagnosis Date Noted  . Tinea versicolor 05/30/2020  . Shoulder pain 12/24/2019  . Myalgia 12/24/2019  . Bronchiectasis without complication ([place]) 72/82/0601  . Dysphagia 07/23/2019  . Abdominal pain 07/23/2019  . Nonintractable headache 03/19/2019  . Paresthesia 03/19/2019  . Decreased grip strength 02/04/2019  . Itching 02/02/2019  . Local superficial swelling 02/02/2019  . Weight loss 02/02/2019  . Osteopenia 08/09/2018  . Chronic rhinitis 07/23/2018  . 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 Healthcare maintenance 07/16/2018  . Moderate persistent asthma with acute exacerbation 01/30/2018  . Advance care planning 01/02/2018  . Vitamin D deficiency 07/08/2017  . 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/19/2017  . Primary osteoarthritis of both knees 01/19/2017  . Primary osteoarthritis of both feet 01/19/2017  . Cough 11/14/2016  . SOB (shortness of breath) 11/14/2016  . Diverticulosis 03/19/2016  . Lumbar compression fracture ([place]) 06/20/2015  .  Fibromyalgia 02/26/2013  . Allergic rhinitis due to pollen 11/20/2010  . 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 IRRITABLE BOWEL SYNDROME 04/06/2010  .  THYROID NODULE, RIGHT 12/07/2009  . OSTEOARTHRITIS 11/28/2009  . FIBROIDS, UTERUS 05/10/2008  . Insomnia 05/10/2008  . ASYMPTOMATIC POSTMENOPAUSAL STATUS 05/10/2008  . Essential hypertension 01/28/2008  . HYPERCHOLESTEROLEMIA 01/07/2008  . GLAUCOMA 01/07/2008  . Gastroesophageal reflux disease 04/14/2007  . Asthma 04/14/2007    Past Medical History:  Diagnosis Date  . ALLERGIC RHINITIS 07/26/2008  . ASTHMA 04/14/2007  . Asthma   . ASYMPTOMATIC POSTMENOPAUSAL STATUS 05/10/2008  . Bronchiectasis ([place])   . CHEST PAIN 08/26/2008  . COLONIC POLYPS, HX OF 04/14/2007   colonic leiomyoma  . Decreased grip strength   . Episcleritis   . FIBROIDS, UTERUS 05/10/2008  . Fibromyalgia   . Gait abnormality   . GERD 04/14/2007  . HYPERCHOLESTEROLEMIA 01/07/2008  . HYPERGLYCEMIA 11/28/2009  . HYPERTENSION 01/28/2008  . INSOMNIA 05/10/2008  . Irritable bowel syndrome 04/06/2010  . OSTEOARTHRITIS 11/28/2009  . 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 SINUSITIS 02/10/2009    Family History  Problem Relation Age of Onset  . Diabetes Mother   . Hypertension Mother   . Glaucoma Mother   . Polymyositis Mother   . Heart disease Father        CHF  . Allergic rhinitis Father   . Asthma Father   . Other Father        sideoblastic anemia  . Allergic rhinitis Sister   . Asthma Sister   . Allergic rhinitis Brother   . Asthma Brother   . Prostate cancer Brother   . Allergic rhinitis Maternal Aunt   . Asthma Maternal Aunt   . Allergic rhinitis Paternal Aunt   . Asthma Paternal Aunt   . Breast cancer Cousin   . Colon cancer Cousin   . Cancer Neg Hx        No FH of Colon Cancer  . Angioedema Neg Hx   . Atopy Neg Hx   . Immunodeficiency Neg Hx   . Urticaria Neg Hx   . Eczema Neg Hx    Past Surgical History:  Procedure Laterality Date  . COLONOSCOPY W/ POLYPECTOMY    . ELECTROCARDIOGRAM  12/04/2006  . IR RADIOLOGY PERIPHERAL GUIDED IV START  04/21/2018  . IR US GUIDE VASC ACCESS LEFT  04/21/2018  . NASAL  SEPTUM SURGERY    . Stress Cardiolite  02/13/2002   Social History   Social History Narrative   Lives alone (widowed 1998).   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      Does not have living will.  Does desire CPR.  Does not want life support for prolonged periods of time if futile.   2 sons and 1 daughter.        Right-handed.   No daily caffeine use.   Immunization History  Administered Date(s) Administered  . Fluad Quad(high Dose 65+) 05/07/2019, 05/24/2020  . Influenza Split 06/04/2011, 05/27/2012  . Influenza Whole 08/18/2008, 05/26/2010  . Influenza, High Dose Seasonal PF 05/22/2017, 06/11/2018  . Influenza,inj,Quad PF,6+ Mos 06/08/2013, 05/19/2014, 06/08/2015, 05/07/2016  . PFIZER SARS-COV-2 Vaccination 10/27/2019, 11/17/2019  . Pneumococcal Conjugate-13 03/17/2014  . Pneumococcal Polysaccharide-23 11/23/2009, 06/08/2015, 07/14/2018  . Td 11/09/2002  . Tdap 07/03/2013  . Zoster 06/17/2013  . Zoster Recombinat (Shingrix) 02/19/2019, 08/27/2019     Objective: Vital Signs: BP 140/69 (BP Location: Left Arm, Patient Position: Sitting, Cuff Size: Normal)   Pulse 64  Resp 16   Ht 5' 2"  (1.575 m)   Wt 136 lb (61.7 kg)   BMI 24.87 kg/m    Physical Exam Vitals and nursing note reviewed.  Constitutional:      Appearance: She is well-developed.  HENT:     Head: Normocephalic and atraumatic.  Eyes:     Conjunctiva/sclera: Conjunctivae normal.  Pulmonary:     Effort: Pulmonary effort is normal.  Abdominal:     Palpations: Abdomen is soft.  Musculoskeletal:     Cervical back: Normal range of motion.  Skin:    General: Skin is warm and dry.     Capillary Refill: Capillary refill takes less than 2 seconds.  Neurological:     Mental Status: She is alert and oriented to person, place, and time.  Psychiatric:        Behavior: Behavior normal.      Musculoskeletal Exam: Generalized hyperalgesia.  Trapezius muscle tension and tenderness bilaterally.   Generalized tender on scalp. Tenderness over the left temporal region.  No tenderness over TMJs at this time.  C-spine, thoracic spine, and lumbar spine have good range of motion.  Shoulder joints, elbow joints, wrist joints, MCPs, PIPs, DIPs have good range of motion with no synovitis.  She has PIP and DIP thickening consistent with osteoarthritis of both hands.  She is able to make a complete fist bilaterally.  Hip joints have good range of motion with no discomfort.  Knee joints have good range of motion with no warmth or effusion.  Ankle joints have good range of motion with no tenderness or inflammation.  CDAI Exam: CDAI Score: -- Patient Global: --; Provider Global: -- Swollen: --; Tender: -- Joint Exam 07/11/2020   No joint exam has been documented for this visit   There is currently no information documented on the homunculus. Go to the Rheumatology activity and complete the homunculus joint exam.  Investigation: No additional findings.  Imaging: DG Chest 2 View  Result Date: 06/25/2020 CLINICAL DATA:  Chronic bronchitis EXAM: CHEST - 2 VIEW COMPARISON:  04/23/2019 FINDINGS: The heart size and mediastinal contours are within normal limits. Diffuse bilateral interstitial pulmonary opacity. Disc degenerative disease of the thoracic spine. IMPRESSION: Diffuse bilateral interstitial pulmonary opacity, consistent with edema or infection. No focal airspace opacity. Electronically Signed   By: Eddie Candle M.D.   On: 06/25/2020 15:51    Recent Labs: Lab Results  Component Value Date   WBC 6.4 03/10/2019   HGB 13.7 03/10/2019   PLT 212 03/10/2019   NA 138 05/30/2020   K 4.1 05/30/2020   CL 102 05/30/2020   CO2 30 05/30/2020   GLUCOSE 106 (H) 05/30/2020   BUN 15 05/30/2020   CREATININE 0.88 05/30/2020   BILITOT 0.5 05/30/2020   ALKPHOS 54 05/30/2020   AST 17 05/30/2020   ALT 9 05/30/2020   PROT 7.0 05/30/2020   ALBUMIN 4.6 05/30/2020   CALCIUM 10.0 05/30/2020   GFRAA >60  03/10/2019    Speciality Comments: No specialty comments available.  Procedures:  No procedures performed Allergies: Cefuroxime axetil, Doxycycline, Latex, Lisinopril, Lovastatin, and Sulfonamide derivatives   Assessment / Plan:     Visit Diagnoses: Primary osteoarthritis of both hands: She has PIP and DIP thickening consistent with osteoarthritis of both hands.  She has no joint tenderness or inflammation on exam.  She experiences intermittent discomfort and stiffness in her hands.  She is able to make a complete fist bilaterally.  She will continue taking natural antiinflammatories as  recommended.  We discussed the importance of joint protection and muscle strengthening.  She will follow-up in the office in 6 months.  Bilateral carpal tunnel syndrome: She experiences intermittent paresthesias in both hands, R>L.  NCV with EMG on 05/06/19: moderate right carpal tunnel syndrome.   Primary osteoarthritis of both shoulders - Bilateral acromioclavicular arthritis.  She has some discomfort in both shoulder joints with abduction bilaterally.  She has started going to physical therapy which has improved her neck and shoulder pain significantly.  Primary osteoarthritis of both hips: She has good range of motion of both hip joints with no groin pain at this time.  Primary osteoarthritis of both knees: She has good range of motion of both knee joints on exam.  No warmth or effusion was noted.  She has occasional discomfort and stiffness in her knees.  Primary osteoarthritis of both feet: She is not experiencing any discomfort in her feet at this time.  Headache around the eyes - She was evaluated by Dr. Estanislado Pandy on 06/27/20.  She has been experiencing intermittent headaches for the past 1 year.  At that time she was experiencing tenderness on her scalp in the temporal region as well as over both TMJ.  ESR was checked on 06/27/20 which was 17.  She was evaluated by Dr. Krista Blue urgently on 06/28/20.  There  was no evidence of temporal arteritis at that visit.  On evaluation today she has generalized tenderness on her scalp.  Her headaches have become less frequent and less severe. She was evaluated by myself and Dr. Estanislado Pandy today and there is a low suspicion for temporal arteritis. She will continue taking Tylenol as needed for symptomatic relief.  Dr. Krista Blue suggested trying Cymbalta 30 mg 1 capsule by mouth daily but the patient does not plan to start due to being apprehensive of potential side effects.  Potential side effects were reviewed today in detail but she would still like to hold off at this time.  She was advised to follow-up with Dr. Krista Blue if her headaches persist or worsen.  Fibromyalgia: She has generalized hyperalgesia and positive tender points on exam.  She experiences interval myalgias and muscle cramps.  She takes magnesium malate at bedtime.  She was evaluated by Dr. Krista Blue on 06/28/2020 who suggested a trial of Cymbalta.  She does not plan on starting cymbalta due to being apprehensive of potential side effects.  She has started to notice some improvement in her myalgias and joint pain since starting physical therapy.  We discussed the importance of regular exercise and stretching.   Other insomnia: Chronic   Other fatigue: Stable.   History of vertebral compression fracture - 08/04/18 DXA T-1.1.  She is due to update DEXA this month.  She has an upcoming physical with her PCP and will discuss at that time.  No midline spinal tenderness at this time. She will continue taking vitamin D 2,000 units daily.   History of vitamin D deficiency: She is taking vitamin D 2,000 units daily.    Other medical conditions are listed as follows:   History of glaucoma  History of asthma  History of kidney stones  History of gastroesophageal reflux (GERD)  History of IBS  History of diverticulosis  History of hyperlipidemia  History of hypertension  Orders: No orders of the defined types  were placed in this encounter.  No orders of the defined types were placed in this encounter.   Follow-Up Instructions: Return in about 6 months (around 01/08/2021) for  Osteoarthritis, Fibromyalgia.   Ofilia Neas, PA-C  Note - This record has been created using Dragon software.  Chart creation errors have been sought, but may not always  have been located. Such creation errors do not reflect on  the standard of medical care.

## 2020-06-27 NOTE — Telephone Encounter (Signed)
Pt was informed of cxr results will continue to monitor for any changes

## 2020-06-27 NOTE — Telephone Encounter (Signed)
Shaili B. Deveshwar, MD office called wanting RN to call pt to schedule an appt soon for the pt due to having temporal headache. I checked the schedule and there is nothing until December and they do not want the pt to wait that long. Please advise.

## 2020-06-27 NOTE — Patient Instructions (Signed)
COVID-19 vaccine recommendations:   COVID-19 vaccine is recommended for everyone (unless you are allergic to a vaccine component), even if you are on a medication that suppresses your immune system.   Do not take Tylenol or any anti-inflammatory medications (NSAIDs) 24 hours prior to the COVID-19 vaccination.   There is no direct evidence about the efficacy of the COVID-19 vaccine in individuals who are on medications that suppress the immune system.   Even if you are fully vaccinated, and you are on any medications that suppress your immune system, please continue to wear a mask, maintain at least six feet social distance and practice hand hygiene.   If you develop a COVID-19 infection, please contact your PCP or our office to determine if you need antibody infusion.  The booster vaccine is now available for immunocompromised patients. It is advised that if you had Pfizer vaccine you should get Pfizer booster.  If you had a Moderna vaccine then you should get a Moderna booster. Johnson and Johnson does not have a booster vaccine at this time.  Please see the following web sites for updated information.   [email or web address]    

## 2020-06-27 NOTE — Telephone Encounter (Signed)
I was able to speak with the patient. She accepted an appt on 06/28/20.

## 2020-06-28 ENCOUNTER — Encounter: Payer: Self-pay | Admitting: Neurology

## 2020-06-28 ENCOUNTER — Ambulatory Visit (INDEPENDENT_AMBULATORY_CARE_PROVIDER_SITE_OTHER): Payer: Medicare Other | Admitting: Neurology

## 2020-06-28 ENCOUNTER — Encounter: Payer: Medicare Other | Admitting: Physical Therapy

## 2020-06-28 VITALS — BP 128/70 | HR 74 | Ht 62.0 in | Wt 135.0 lb

## 2020-06-28 DIAGNOSIS — R202 Paresthesia of skin: Secondary | ICD-10-CM | POA: Diagnosis not present

## 2020-06-28 DIAGNOSIS — R519 Headache, unspecified: Secondary | ICD-10-CM | POA: Diagnosis not present

## 2020-06-28 LAB — SEDIMENTATION RATE: Sed Rate: 17 mm/h (ref 0–30)

## 2020-06-28 MED ORDER — DULOXETINE HCL 30 MG PO CPEP: 30.0000 mg | ORAL_CAPSULE | Freq: Every day | ORAL | 11 refills | Status: DC

## 2020-06-28 NOTE — Progress Notes (Signed)
Sed rate is normal.  I do not suspect temporal arteritis.  Patient should to schedule an appointment with the neurologist.

## 2020-06-28 NOTE — Progress Notes (Signed)
PATIENT: Samantha [name] DOB: 1946-08-27  Chief Complaint  Patient presents with  . Headache    Reports headaches nearly everyday that vary in the level of intensity. She takes Tylenol for her more significant pain. She does not medicate the milder ones.      HISTORICAL  Samantha [name] is of 74 year old female, seen in request by her primary care physician Dr. Elsie Stain for evaluation of right wrist pain, right hand paresthesia, weakness, frequent headaches, initial evaluation was on March 19, 2019.  I have reviewed and summarized the referring note from the referring physician.  She had a past medical history of hypertension, disease, fibromyalgia, asthma, hiatal hernia,  Since 2018, she began to notice intermittent right hand paresthesia, weakness, drop things from her right hand, she was seen by outside neurologist, was diagnosed with carpal tunnel syndromes, a while, her symptoms has much improved, but began to have recurrent right hand paresthesia mainly involving first 4 fingers, weak grip, left hand thumb involvement, but much milder,  She also complains of frequent headaches, was seen by ophthalmologist 6 months ago was told to be normal, during her headaches, she complains of bilateral frontal, facial even jaw pain, tired to talk during headaches, she denies visual loss, headache has improved,, intermittent, couple times a week, she has tried over-the-counter ibuprofen with some help  UPDATE Jun 28 2020: I personally reviewed CT head on February 17 2019, no acute abnormalities, mild small vessel disease.  EMG nerve conduction study on May 06, 2019 confirmed the right carpal tunnel syndrome  Today her main concern is left temporal area headaches, she carries a diagnosis of fibromyalgia, complains of diffuse body achy pain, in 2020, she noticed bumpy sensation at the left temporal region, painful upon deep palpitation, was seen by ophthalmologist, rheumatologist, ESR  multiple times were within normal limit, rule out the temporal arteritis  However, over the past 1 year, she has almost daily headaches, variable degree, taking Tylenol once or twice a week for moderate headaches, also on multiple supplement    REVIEW OF SYSTEMS: Full 14 system review of systems performed and notable only for as above All other review of systems were negative.  ALLERGIES: Allergies  Allergen Reactions  . Cefuroxime Axetil     REACTION: pt not sure of reaction- she can tolerate amoxil  . Doxycycline Itching  . Latex Itching  . Lisinopril     Cough  . Lovastatin Other (See Comments)    Muscle aches   . Sulfonamide Derivatives     REACTION: pt not sure of reaction    HOME MEDICATIONS: Current Outpatient Medications  Medication Sig Dispense Refill  . acetaminophen (TYLENOL) 500 MG tablet Take 1,000 mg by mouth every 6 (six) hours as needed for mild pain or headache.    . albuterol (VENTOLIN HFA) 108 (90 Base) MCG/ACT inhaler Inhale 2 puffs into the lungs every 4 (four) hours as needed for wheezing or shortness of breath. 18 g 3  . Azelastine HCl 0.15 % SOLN Place 2 sprays into both nostrils 2 (two) times daily as needed (runny nose). 90 mL 4  . Beclomethasone Dipropionate (QNASL) 80 MCG/ACT AERS Place 2 sprays into both nostrils daily. Two sprays into both nostrils daily 10.6 g 3  . benzonatate (TESSALON) 200 MG capsule Take 1 capsule (200 mg total) by mouth 3 (three) times daily as needed for cough. 30 capsule 1  . bifidobacterium infantis (ALIGN) capsule Take 1 capsule by mouth daily.      Marland Kitchen  budesonide-formoterol (SYMBICORT) 160-4.5 MCG/ACT inhaler Inhale 2 puffs into the lungs 2 (two) times daily. 3 Inhaler 3  . cetirizine (ZYRTEC) 10 MG tablet Take 10 mg by mouth at bedtime.     . Cholecalciferol (VITAMIN D) 50 MCG (2000 UT) CAPS Take 1 capsule by mouth daily.    Marland Kitchen losartan-hydrochlorothiazide (HYZAAR) 100-12.5 MG tablet Take 1 tablet by mouth daily. 90 tablet 3    . Magnesium Malate POWD (1223m tabs) 1 tablet by mouth qhs    . Manganese 50 MG TABS Take 50 mg by mouth at bedtime.     . Misc Natural Products (TART CHERRY ADVANCED PO) Take 2 tablets by mouth daily.     . montelukast (SINGULAIR) 10 MG tablet Take 1 tablet (10 mg total) by mouth at bedtime. 90 tablet 3  . Olopatadine HCl (PATADAY) 0.2 % SOLN Place 1 drop into both eyes daily as needed (itchy eyes).    . pantoprazole (PROTONIX) 40 MG tablet Take 1 tablet (40 mg total) by mouth 2 (two) times daily. 180 tablet 3  . Polyethyl Glycol-Propyl Glycol (SYSTANE) 0.4-0.3 % GEL ophthalmic gel Place 1 application into both eyes every 6 (six) hours as needed (dry eyes).    . thiamine (VITAMIN B-1) 50 MG tablet Take 100 mg by mouth daily.    . TURMERIC PO Take 1 tablet by mouth daily.      No current facility-administered medications for this visit.    PAST MEDICAL HISTORY: Past Medical History:  Diagnosis Date  . ALLERGIC RHINITIS 07/26/2008  . ASTHMA 04/14/2007  . Asthma   . ASYMPTOMATIC POSTMENOPAUSAL STATUS 05/10/2008  . Bronchiectasis ([place]   . CHEST PAIN 08/26/2008  . COLONIC POLYPS, HX OF 04/14/2007   colonic leiomyoma  . Decreased grip strength   . Episcleritis   . FIBROIDS, UTERUS 05/10/2008  . Fibromyalgia   . Gait abnormality   . GERD 04/14/2007  . HYPERCHOLESTEROLEMIA 01/07/2008  . HYPERGLYCEMIA 11/28/2009  . HYPERTENSION 01/28/2008  . INSOMNIA 05/10/2008  . Irritable bowel syndrome 04/06/2010  . OSTEOARTHRITIS 11/28/2009  . 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 SINUSITIS 02/10/2009    PAST SURGICAL HISTORY: Past Surgical History:  Procedure Laterality Date  . COLONOSCOPY W/ POLYPECTOMY    . ELECTROCARDIOGRAM  12/04/2006  . IR RADIOLOGY PERIPHERAL GUIDED IV START  04/21/2018  . IR [place] VASC ACCESS LEFT  04/21/2018  . NASAL SEPTUM SURGERY    . Stress Cardiolite  02/13/2002    FAMILY HISTORY: Family History  Problem Relation Age of Onset  . Diabetes Mother   . Hypertension Mother    . Glaucoma Mother   . Polymyositis Mother   . Heart disease Father        CHF  . Allergic rhinitis Father   . Asthma Father   . Other Father        sideoblastic anemia  . Allergic rhinitis Sister   . Asthma Sister   . Allergic rhinitis Brother   . Asthma Brother   . Prostate cancer Brother   . Allergic rhinitis Maternal Aunt   . Asthma Maternal Aunt   . Allergic rhinitis Paternal Aunt   . Asthma Paternal Aunt   . Breast cancer Cousin   . Colon cancer Cousin   . Cancer Neg Hx        No FH of Colon Cancer  . Angioedema Neg Hx   . Atopy Neg Hx   . Immunodeficiency Neg Hx   . Urticaria Neg Hx   .  Eczema Neg Hx     SOCIAL HISTORY: Social History   Socioeconomic History  . Marital status: Widowed    Spouse name: Not on file  . Number of children: 3  . Years of education: some college  . Highest education level: Not on file  Occupational History    Employer: RETIRED    Comment: Retired Stage manager)  Tobacco Use  . Smoking status: Never Smoker  . Smokeless tobacco: Never Used  Vaping Use  . Vaping Use: Never used  Substance and Sexual Activity  . Alcohol use: No  . Drug use: No  . Sexual activity: Never  Other Topics Concern  . Not on file  Social History Narrative   Lives alone (widowed 1998).   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      Does not have living will.  Does desire CPR.  Does not want life support for prolonged periods of time if futile.   2 sons and 1 daughter.        Right-handed.   No daily caffeine use.   Social Determinants of Health   Financial Resource Strain: Low Risk   . Difficulty of Paying Living Expenses: Not hard at all  Food Insecurity: No Food Insecurity  . Worried About Charity fundraiser in the Last Year: Never true  . Ran Out of Food in the Last Year: Never true  Transportation Needs: No Transportation Needs  . Lack of Transportation (Medical): No  . Lack of Transportation (Non-Medical): No  Physical Activity:  Inactive  . Days of Exercise per Week: 0 days  . Minutes of Exercise per Session: 0 min  Stress: No Stress Concern Present  . Feeling of Stress : Not at all  Social Connections:   . Frequency of Communication with Friends and Family: Not on file  . Frequency of Social Gatherings with Friends and Family: Not on file  . Attends Religious Services: Not on file  . Active Member of Clubs or Organizations: Not on file  . Attends Archivist Meetings: Not on file  . Marital Status: Not on file  Intimate Partner Violence: Not At Risk  . Fear of Current or Ex-Partner: No  . Emotionally Abused: No  . Physically Abused: No  . Sexually Abused: No     PHYSICAL EXAM   Vitals:   06/28/20 1251  BP: 128/70  Pulse: 74  Weight: 135 lb (61.2 kg)  Height: 5' 2"  (1.575 m)   Not recorded     Body mass index is 24.69 kg/m.  PHYSICAL EXAMNIATION:  Gen: NAD, conversant, well nourised, obese, well groomed      NEUROLOGICAL EXAM:  MENTAL STATUS: Speech/cognition Awake, alert, oriented to history taking and casual conversation   CRANIAL NERVES: CN II: Visual fields are full to confrontation.Pupils are round equal and briskly reactive to light. CN III, IV, VI: extraocular movement are normal. No ptosis. CN V: Facial sensation is intact to pinprick in all 3 divisions bilaterally. Corneal responses are intact.  CN VII: Face is symmetric with normal eye closure and smile. CN VIII: Hearing is normal to rubbing fingers CN IX, X: Palate elevates symmetrically. Phonation is normal. CN XI: Head turning and shoulder shrug are intact    MOTOR: Right wrist pain, mild weakness at right abductor pollicis brevis, opponens,  right wrist Tine signl Diffuse body achy pain upon deep palpitations  REFLEXES: Reflexes are 2+ and symmetric at the biceps, triceps, knees, and ankles. Plantar  responses are flexor.  SENSORY: Decreased to pinprick at right first 4 finger pads  COORDINATION: Rapid  alternating movements and fine finger movements are intact. There is no dysmetria on finger-to-nose and heel-knee-shin.    GAIT/STANCE: Need push-up to get up from seated position, mildly antalgic,   DIAGNOSTIC DATA (LABS, IMAGING, TESTING) - I reviewed patient records, labs, notes, testing and imaging myself where available.   ASSESSMENT AND PLAN  Samantha [name] is a 74 y.o. female   Right hand paresthesia and weakness  Consistent with moderate right carpal tunnel syndromes Fibromyalgia Headaches  ESR C-reactive protein were normal multiple times,  No evidence of temporal arteritis  She also has multiple tenderness at different body spots, consistent with her long history of fibromyalgia,  Will try Cymbalta 30 mg daily  Continue follow-up with her primary care physician     Marcial Pacas, M.D. Ph.D.  [facility] [address] [place], [place] [address] Ph: [phone] Fax: [phone]  CC:  Tonia Ghent, MD

## 2020-06-30 ENCOUNTER — Encounter: Payer: Medicare Other | Admitting: Physical Therapy

## 2020-07-03 ENCOUNTER — Other Ambulatory Visit: Payer: Self-pay | Admitting: Family Medicine

## 2020-07-03 DIAGNOSIS — E559 Vitamin D deficiency, unspecified: Secondary | ICD-10-CM

## 2020-07-03 DIAGNOSIS — I10 Essential (primary) hypertension: Secondary | ICD-10-CM

## 2020-07-05 ENCOUNTER — Other Ambulatory Visit: Payer: Self-pay

## 2020-07-05 ENCOUNTER — Ambulatory Visit: Payer: Medicare Other | Attending: Rheumatology | Admitting: Physical Therapy

## 2020-07-05 ENCOUNTER — Encounter: Payer: Self-pay | Admitting: Physical Therapy

## 2020-07-05 DIAGNOSIS — M797 Fibromyalgia: Secondary | ICD-10-CM | POA: Insufficient documentation

## 2020-07-05 DIAGNOSIS — M25511 Pain in right shoulder: Secondary | ICD-10-CM | POA: Insufficient documentation

## 2020-07-05 DIAGNOSIS — G8929 Other chronic pain: Secondary | ICD-10-CM | POA: Diagnosis not present

## 2020-07-05 DIAGNOSIS — R262 Difficulty in walking, not elsewhere classified: Secondary | ICD-10-CM | POA: Diagnosis not present

## 2020-07-05 DIAGNOSIS — M25512 Pain in left shoulder: Secondary | ICD-10-CM | POA: Diagnosis not present

## 2020-07-05 DIAGNOSIS — M6281 Muscle weakness (generalized): Secondary | ICD-10-CM | POA: Insufficient documentation

## 2020-07-05 NOTE — Therapy (Signed)
[place] [facility] [address], [place], [address] Phone: [phone]   Fax:  [phone]  Physical Therapy Treatment  Patient Details  Name: Samantha [name] MRN: [id number] Date of Birth: 1945-10-16 Referring Provider (PT): Bo Merino, MD   Encounter Date: 07/05/2020   PT End of Session - 07/05/20 1226    Visit Number 7    Number of Visits 24    Date for PT Re-Evaluation 08/10/20    Authorization Type [employer] MEDICARE reporting period from 05/18/2020    Progress Note Due on Visit 10    PT Start Time 1115    PT Stop Time 1205    PT Time Calculation (min) 50 min    Activity Tolerance Patient tolerated treatment well    Behavior During Therapy [facility] for tasks assessed/performed           Past Medical History:  Diagnosis Date  . ALLERGIC RHINITIS 07/26/2008  . ASTHMA 04/14/2007  . Asthma   . ASYMPTOMATIC POSTMENOPAUSAL STATUS 05/10/2008  . Bronchiectasis ([place])   . CHEST PAIN 08/26/2008  . COLONIC POLYPS, HX OF 04/14/2007   colonic leiomyoma  . Decreased grip strength   . Episcleritis   . FIBROIDS, UTERUS 05/10/2008  . Fibromyalgia   . Gait abnormality   . GERD 04/14/2007  . HYPERCHOLESTEROLEMIA 01/07/2008  . HYPERGLYCEMIA 11/28/2009  . HYPERTENSION 01/28/2008  . INSOMNIA 05/10/2008  . Irritable bowel syndrome 04/06/2010  . OSTEOARTHRITIS 11/28/2009  . 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 SINUSITIS 02/10/2009    Past Surgical History:  Procedure Laterality Date  . COLONOSCOPY W/ POLYPECTOMY    . ELECTROCARDIOGRAM  12/04/2006  . IR RADIOLOGY PERIPHERAL GUIDED IV START  04/21/2018  . IR US GUIDE VASC ACCESS LEFT  04/21/2018  . NASAL SEPTUM SURGERY    . Stress Cardiolite  02/13/2002    There were no vitals filed for this visit.   Subjective Assessment - 07/05/20 1123    Subjective Patient reports she has no pain upon arrival. She has been recovering from bronchitis and still is having a little bit  of difficulty with breathing. States she continues to have numbness and dififculty using her hands and would like to work on it now. She recently was prescribed a new medication for headaches but did not take it because she looked up the side effects and did not like them. Has not been doing HEP much due to being sick.    Pertinent History Patient is a 74 y.o. female who presents to outpatient physical therapy with a referral for medical diagnosis primary osteoarthritis of both hands, primary osteoarthritis of both shoulders, fibromyalgia. This patient's chief complaints consist of widespread pain in neck, back, B shoulders, UE including hands, hips, LEs including feet leading to the following functional deficits: difficulty with ADLs, IADLs, carrying groceries, putting gate down on vehicle, sleeping, laying down, getting up from the chair, walking, tolerating activity, holding items, pushing, pulling, laundry, etc. Relevant past medical history and comorbidities include slightly enlarged heart, bronchiectasis, symptomatic asthma and allergies, gradual unintensional weight loss (10 lbs over years? Patient unclear), ovarian cysts and was referred to oncologist and supposed to have a scan (October), increased urgency for bladder recently, back pain with bulging disk, fibromyalgia, R knee meniscus tear with surgery in 2013,  raynaud's, BIS, insomnia, GERD, hx left hip bursitis, gait abnormality, chest pain .  Patient denies hx of cancer, stroke, seizures, lung problem besides above, major cardiac events (except enlarged  heart), diabetes, new onset stumbling or dropping things (does have chronic dropping things).    Limitations Lifting;Standing;Walking;House hold activities;Writing;Other (comment)   difficulty with ADLs, IADLs, carrying groceries, putting gate down on vehicle, sleeping, laying down, getting up from the chair, walking, tolerating activity, holding items, pushing, pulling, laundry, etc.   Diagnostic  tests 12/21/19 shoulder radiograph: "You have similar findings in both shoulders with arthritis at the joint where the collarbone attaches to the shoulder blade."    Patient Stated Goals to have no pain.    Currently in Pain? No/denies           TREATMENT: DOES have a Latex sensitivity  Therapeutic exercise:to centralize symptoms and improve ROM, strength, muscular endurance, and activity tolerance required for successful completion of functional activities. Resting HR prior to exercise: SpO2 98%, Heart rate 91 BPM.  -NuStep level0 (1 per pt request, but returned to 0 due to pain)using bilateral upper and lower extremities. Seatsetting 7/handle setting9. For improved extremity mobility, muscular endurance, and activity tolerance; and to induce the analgesic effect of aerobic exercise, stimulate improved joint nutrition, and prepare body structures and systems for following interventions. x7Minutes. Average SPM =78. Stopped due to chest pain associated with breathing.  - putty exercises using mix of soft and medium soft putty. Gripping, key grip, three finger pinch, tip to tip, pulling apart. Provided HEP for this.  - standing rows with blue theraband, 2x15 - standing/seated bicep curls, 2x15 with 3# DB - standing B shoulder ER, 2x15 yellow band -  modified push up against TM bar 2x15 - seated lat pull with blue theraband anchored overhead, 1x15 - standing lat pull at bar with 15#, 1x15 - sit <> stand with no UE support from 18.5 inch chair, 2x10   HOME EXERCISE PROGRAM Access Code: [email or web address] URL: [email or web address] Date: 07/05/2020 Prepared by: Rosita Kea  Exercises Sit to Stand without Arm Support - 1 x daily - 3 sets - 10 reps Row with band/cable - 1 x daily - 3 sets - 15 reps - blue band Shoulder Flexion Wall Slide with Towel - 1 x daily - 2 sets - 10 reps Shoulder External Rotation with Resistance - 1 x daily - 3 sets - 10 reps - red or yellow  band Side Lying Open Book - 20 reps Supine Lower Trunk Rotation - 20 reps Putty Squeezes - 10 reps Key Pinch with Putty - 10 reps Tip Pinch with Putty - 10 reps Seated Three Finger Pinch with Putty - 10 reps Finger Pinch and Pull with Putty - 10 reps      PT Education - 07/05/20 1226    Education Details Exercise purpose/form. Self management techniques    Person(s) Educated Patient    Methods Explanation;Demonstration;Tactile cues;Verbal cues    Comprehension Verbalized understanding;Returned demonstration;Verbal cues required;Tactile cues required;Need further instruction            PT Short Term Goals - 05/18/20 1739      PT SHORT TERM GOAL #1   Title Be independent with initial home exercise program for self-management of symptoms.    Baseline Initial HEP provided at IE (05/18/2020);    Time 2    Period Weeks    Status New             PT Long Term Goals - 05/18/20 1159      PT LONG TERM GOAL #1   Title Be independent with a long-term home exercise program for self-management of symptoms.    Baseline  initial HEP provided at IE (05/18/2020);    Time 12    Period Weeks    Status --   TARGET DATE FOR ALL LONG TERM GOALS: 08/10/2020     PT LONG TERM GOAL #2   Title Demonstrate improved FOTO score to equal or greater than 58 by visit #12 to demonstrate improvement in overall condition and self-reported functional ability.    Baseline 51 (05/18/2020);    Time 12    Period Weeks    Status New      PT LONG TERM GOAL #3   Title Patient will complete 5 Time Sit to Stand from 18 inch surface with no UE support in equal or less than 16 seconds to improve her ability to perform ADLs, transfers, and be able to carry things when standing up from her chair.    Baseline 20 seconds, BUE use on 18.5 inch plinth (05/18/2020);    Time 12    Period Weeks    Status New      PT LONG TERM GOAL #4   Title Patient will improve R grip strength by 5 lbs to improve her ability to use her R  hand for holding and manipulating objects.    Baseline R: (47+44+35)/3 = 42 lbs (05/18/2020);    Time 12    Period Weeks    Status New      PT LONG TERM GOAL #5   Title Patient will report improvement in ability to complete community, work and/or recreational activities with at least 50% less limitation due to current condition.    Baseline Functional Limitations: carrying groceries, putting gate down, difficulty sleeping, laying down, getting up from the chair, completing ADLs and IADLs, pushing, pulling, laundry (05/18/2020);    Time 12    Period Weeks    Status New      Additional Long Term Goals   Additional Long Term Goals Yes      PT LONG TERM GOAL #6   Title Improve B shoulder AROM flexion to equal or greater than 160 degrees without increased pain to improve ability to reach and complete ADLs such as dressing with less difficulty.    Baseline R = 140, L = 150 both painful (05/18/2020);    Time 12    Period Weeks    Status New                 Plan - 07/05/20 1224    Clinical Impression Statement Patient is returning after being away for 3 weeks due to bronchitis. Did not advance exercises today but focused on re-starting where she left off. Tolerated well with mild intermittent discomfort. Provided exercises to improve hand strength. Patient would benefit from continued management of limiting condition by skilled physical therapist to address remaining impairments and functional limitations to work towards stated goals and return to PLOF or maximal functional independence.    Personal Factors and Comorbidities Age;Comorbidity 3+;Time since onset of injury/illness/exacerbation;Fitness;Past/Current Experience    Comorbidities Relevant past medical history and comorbidities include slightly enlarged heart, bronchiectasis, symptomatic asthma and allergies, gradual unintensional weight loss (10 lbs over years? Patient unclear), ovarian cysts and was referred to oncologist and supposed  to have a scan (October), increased urgency for bladder recently, back pain with bulging disk, fibromyalgia, R knee meniscus tear with surgery in 2013,  raynaud's, IBS, insomnia, GERD, hx left hip bursitis, gait abnormality, chest pain    Examination-Activity Limitations Bathing;Hygiene/Grooming;Squat;Lift;Stairs;Bed Mobility;Bend;Locomotion Level;Stand;Caring for Others;Reach Overhead;Carry;Transfers;Sit;Dressing;Sleep    Examination-Participation Restrictions  Laundry;Cleaning;Community Activity;Interpersonal Relationship;Meal Prep;Shop    Stability/Clinical Decision Making Evolving/Moderate complexity    Rehab Potential Fair    PT Frequency 2x / week    PT Duration 12 weeks    PT Treatment/Interventions ADLs/Self Care Home Management;Aquatic Therapy;Cryotherapy;Moist Heat;Electrical Stimulation;Gait training;Stair training;Functional mobility training;Therapeutic activities;Therapeutic exercise;Balance training;Neuromuscular re-education;Patient/family education;Manual techniques;Dry needling;Passive range of motion;Energy conservation;Spinal Manipulations;Joint Manipulations    PT Next Visit Plan assess response to HEP, graded exercise for improved strength and ROM    PT Home Exercise Plan Medbridge Access Code: [email or web address]    Consulted and Agree with Plan of Care Patient           Patient will benefit from skilled therapeutic intervention in order to improve the following deficits and impairments:  Abnormal gait, Pain, Decreased mobility, Decreased coordination, Decreased activity tolerance, Decreased endurance, Decreased range of motion, Decreased strength, Hypomobility, Impaired perceived functional ability, Impaired UE functional use, Impaired flexibility, Difficulty walking, Decreased balance  Visit Diagnosis: Fibromyalgia  Muscle weakness (generalized)  Difficulty in walking, not elsewhere classified  Chronic right shoulder pain  Chronic left shoulder pain     Problem  List Patient Active Problem List   Diagnosis Date Noted  . Tinea versicolor 05/30/2020  . Shoulder pain 12/24/2019  . Myalgia 12/24/2019  . Bronchiectasis without complication ([place]) 16/06/9603  . Dysphagia 07/23/2019  . Abdominal pain 07/23/2019  . Nonintractable headache 03/19/2019  . Paresthesia 03/19/2019  . Decreased grip strength 02/04/2019  . Itching 02/02/2019  . Local superficial swelling 02/02/2019  . Weight loss 02/02/2019  . Osteopenia 08/09/2018  . Chronic rhinitis 07/23/2018  . 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 Healthcare maintenance 07/16/2018  . Moderate persistent asthma with acute exacerbation 01/30/2018  . Advance care planning 01/02/2018  . Vitamin D deficiency 07/08/2017  . 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/19/2017  . Primary osteoarthritis of both knees 01/19/2017  . Primary osteoarthritis of both feet 01/19/2017  . Cough 11/14/2016  . SOB (shortness of breath) 11/14/2016  . Diverticulosis 03/19/2016  . Lumbar compression fracture ([place]) 06/20/2015  . Fibromyalgia 02/26/2013  . Allergic rhinitis due to pollen 11/20/2010  . 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 IRRITABLE BOWEL SYNDROME 04/06/2010  . THYROID NODULE, RIGHT 12/07/2009  . OSTEOARTHRITIS 11/28/2009  . FIBROIDS, UTERUS 05/10/2008  . Insomnia 05/10/2008  . ASYMPTOMATIC POSTMENOPAUSAL STATUS 05/10/2008  . Essential hypertension 01/28/2008  . HYPERCHOLESTEROLEMIA 01/07/2008  . GLAUCOMA 01/07/2008  . Gastroesophageal reflux disease 04/14/2007  . Asthma 04/14/2007    Everlean Alstrom. Graylon Good, PT, DPT 07/05/20, 12:27 PM  [facility] [address] [place], [address] Phone: [phone]   Fax:  [phone]  Name: Lynia Landry MRN: [id number] Date of Birth: [birth year]

## 2020-07-07 ENCOUNTER — Encounter: Payer: Self-pay | Admitting: Physical Therapy

## 2020-07-07 ENCOUNTER — Ambulatory Visit: Payer: Medicare Other | Admitting: Physical Therapy

## 2020-07-07 ENCOUNTER — Other Ambulatory Visit: Payer: Self-pay

## 2020-07-07 DIAGNOSIS — M6281 Muscle weakness (generalized): Secondary | ICD-10-CM | POA: Diagnosis not present

## 2020-07-07 DIAGNOSIS — R262 Difficulty in walking, not elsewhere classified: Secondary | ICD-10-CM

## 2020-07-07 DIAGNOSIS — M25511 Pain in right shoulder: Secondary | ICD-10-CM | POA: Diagnosis not present

## 2020-07-07 DIAGNOSIS — M25512 Pain in left shoulder: Secondary | ICD-10-CM | POA: Diagnosis not present

## 2020-07-07 DIAGNOSIS — G8929 Other chronic pain: Secondary | ICD-10-CM

## 2020-07-07 DIAGNOSIS — M797 Fibromyalgia: Secondary | ICD-10-CM

## 2020-07-07 NOTE — Therapy (Signed)
[facility] [address] [place], [address] Phone: [phone]   Fax:  [phone]  Physical Therapy Treatment  Patient Details  Name: Samantha [name] MRN: [id number] Date of Birth: 03/15/1946 Referring Provider (PT): Bo Merino, MD   Encounter Date: 07/07/2020   PT End of Session - 07/07/20 1125    Visit Number 8    Number of Visits 24    Date for PT Re-Evaluation 08/10/20    Authorization Type [employer] MEDICARE reporting period from 05/18/2020    Progress Note Due on Visit 10    PT Start Time 1115    PT Stop Time 1155    PT Time Calculation (min) 40 min    Activity Tolerance Patient tolerated treatment well    Behavior During Therapy [facility] for tasks assessed/performed           Past Medical History:  Diagnosis Date  . ALLERGIC RHINITIS 07/26/2008  . ASTHMA 04/14/2007  . Asthma   . ASYMPTOMATIC POSTMENOPAUSAL STATUS 05/10/2008  . Bronchiectasis ([place])   . CHEST PAIN 08/26/2008  . COLONIC POLYPS, HX OF 04/14/2007   colonic leiomyoma  . Decreased grip strength   . Episcleritis   . FIBROIDS, UTERUS 05/10/2008  . Fibromyalgia   . Gait abnormality   . GERD 04/14/2007  . HYPERCHOLESTEROLEMIA 01/07/2008  . HYPERGLYCEMIA 11/28/2009  . HYPERTENSION 01/28/2008  . INSOMNIA 05/10/2008  . Irritable bowel syndrome 04/06/2010  . OSTEOARTHRITIS 11/28/2009  . 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 SINUSITIS 02/10/2009    Past Surgical History:  Procedure Laterality Date  . COLONOSCOPY W/ POLYPECTOMY    . ELECTROCARDIOGRAM  12/04/2006  . IR RADIOLOGY PERIPHERAL GUIDED IV START  04/21/2018  . IR US GUIDE VASC ACCESS LEFT  04/21/2018  . NASAL SEPTUM SURGERY    . Stress Cardiolite  02/13/2002    There were no vitals filed for this visit.   Subjective Assessment - 07/07/20 1119    Subjective Patient reports she is feeling well overall with no pain upon arrival. Did her putty exercises yesterday and thought  it was great. Feels ready to add the stiffer putty. Reports no soreness or pain following last session but does not want to do the cable lat pull due to feeling like she is straining.    Pertinent History Patient is a 74 y.o. female who presents to outpatient physical therapy with a referral for medical diagnosis primary osteoarthritis of both hands, primary osteoarthritis of both shoulders, fibromyalgia. This patient's chief complaints consist of widespread pain in neck, back, B shoulders, UE including hands, hips, LEs including feet leading to the following functional deficits: difficulty with ADLs, IADLs, carrying groceries, putting gate down on vehicle, sleeping, laying down, getting up from the chair, walking, tolerating activity, holding items, pushing, pulling, laundry, etc. Relevant past medical history and comorbidities include slightly enlarged heart, bronchiectasis, symptomatic asthma and allergies, gradual unintensional weight loss (10 lbs over years? Patient unclear), ovarian cysts and was referred to oncologist and supposed to have a scan (October), increased urgency for bladder recently, back pain with bulging disk, fibromyalgia, R knee meniscus tear with surgery in 2013,  raynaud's, BIS, insomnia, GERD, hx left hip bursitis, gait abnormality, chest pain .  Patient denies hx of cancer, stroke, seizures, lung problem besides above, major cardiac events (except enlarged heart), diabetes, new onset stumbling or dropping things (does have chronic dropping things).    Limitations Lifting;Standing;Walking;House hold activities;Writing;Other (comment)   difficulty with ADLs,  IADLs, carrying groceries, putting gate down on vehicle, sleeping, laying down, getting up from the chair, walking, tolerating activity, holding items, pushing, pulling, laundry, etc.   Diagnostic tests 12/21/19 shoulder radiograph: "You have similar findings in both shoulders with arthritis at the joint where the collarbone attaches  to the shoulder blade."    Patient Stated Goals to have no pain.    Currently in Pain? No/denies           TREATMENT: DOES have a Latex sensitivity  Therapeutic exercise:to centralize symptoms and improve ROM, strength, muscular endurance, and activity tolerance required for successful completion of functional activities. Resting HR prior to exercise: SpO2 98%, Heart rate 91 BPM.  -NuStep level0 (1 per pt request, but returned to 0 due to pain)using bilateral lower extremities. Seatsetting 7. For improved extremity mobility, muscular endurance, and activity tolerance; and to induce the analgesic effect of aerobic exercise, stimulate improved joint nutrition, and prepare body structures and systems for following interventions. x10Minutes. Average SPM =88.   Circuit:  - standing rows with blue theraband, 2x15 - standing/seated bicep curls, 2x15 with 4# DB - modified push up against TM bar 2x10  Circuit: - standing B shoulder ER, 2x15 yellow band - seated lat pull with blue theraband anchored overhead, 2x15 - sit <> stand with no UE support from 17 inch chair while holding 3#DB at chest, 2x15  - standing B AROM shoulder Abduction to 90 degrees, thumbs forward. 1x15 - sit <> stand with no UE support from 17 inch chair while holding 3#DB at chest, 1x10 - slightly abducted suitcase carry 1x100 feet each side with 4#DB each side, 2x100 feet with 6#DB in each side.   Pt required multimodal cuing for proper technique and to facilitate improved neuromuscular control, strength, range of motion, and functional ability resulting in improved performance and form.  HOME EXERCISE PROGRAM Access Code: [email or web address] URL: [email or web address] Date: 07/05/2020 Prepared by: Rosita Kea  Exercises Sit to Stand without Arm Support - 1 x daily - 3 sets - 10 reps Row with band/cable - 1 x daily - 3 sets - 15 reps - blue band Shoulder Flexion Wall Slide with Towel - 1 x daily  - 2 sets - 10 reps Shoulder External Rotation with Resistance - 1 x daily - 3 sets - 10 reps - red or yellow band Side Lying Open Book - 20 reps Supine Lower Trunk Rotation - 20 reps Putty Squeezes - 10 reps Key Pinch with Putty - 10 reps Tip Pinch with Putty - 10 reps Seated Three Finger Pinch with Putty - 10 reps Finger Pinch and Pull with Putty - 10 reps     PT Education - 07/07/20 1124    Education Details Exercise purpose/form. Self management techniques    Person(s) Educated Patient    Methods Explanation;Demonstration;Tactile cues;Verbal cues    Comprehension Verbalized understanding;Returned demonstration;Verbal cues required;Tactile cues required;Need further instruction            PT Short Term Goals - 05/18/20 1739      PT SHORT TERM GOAL #1   Title Be independent with initial home exercise program for self-management of symptoms.    Baseline Initial HEP provided at IE (05/18/2020);    Time 2    Period Weeks    Status New             PT Long Term Goals - 05/18/20 1159      PT LONG TERM GOAL #1   Title Be independent with  a long-term home exercise program for self-management of symptoms.    Baseline initial HEP provided at IE (05/18/2020);    Time 12    Period Weeks    Status --   TARGET DATE FOR ALL LONG TERM GOALS: 08/10/2020     PT LONG TERM GOAL #2   Title Demonstrate improved FOTO score to equal or greater than 58 by visit #12 to demonstrate improvement in overall condition and self-reported functional ability.    Baseline 51 (05/18/2020);    Time 12    Period Weeks    Status New      PT LONG TERM GOAL #3   Title Patient will complete 5 Time Sit to Stand from 18 inch surface with no UE support in equal or less than 16 seconds to improve her ability to perform ADLs, transfers, and be able to carry things when standing up from her chair.    Baseline 20 seconds, BUE use on 18.5 inch plinth (05/18/2020);    Time 12    Period Weeks    Status New      PT LONG  TERM GOAL #4   Title Patient will improve R grip strength by 5 lbs to improve her ability to use her R hand for holding and manipulating objects.    Baseline R: (47+44+35)/3 = 42 lbs (05/18/2020);    Time 12    Period Weeks    Status New      PT LONG TERM GOAL #5   Title Patient will report improvement in ability to complete community, work and/or recreational activities with at least 50% less limitation due to current condition.    Baseline Functional Limitations: carrying groceries, putting gate down, difficulty sleeping, laying down, getting up from the chair, completing ADLs and IADLs, pushing, pulling, laundry (05/18/2020);    Time 12    Period Weeks    Status New      Additional Long Term Goals   Additional Long Term Goals Yes      PT LONG TERM GOAL #6   Title Improve B shoulder AROM flexion to equal or greater than 160 degrees without increased pain to improve ability to reach and complete ADLs such as dressing with less difficulty.    Baseline R = 140, L = 150 both painful (05/18/2020);    Time 12    Period Weeks    Status New                 Plan - 07/07/20 1414    Clinical Impression Statement Patient tolerated treatment well overall and did not have an increase in pain by end of session. Continues to advance in exercises tolerance but still has limitations. Patient would benefit from continued management of limiting condition by skilled physical therapist to address remaining impairments and functional limitations to work towards stated goals and return to PLOF or maximal functional independence.    Personal Factors and Comorbidities Age;Comorbidity 3+;Time since onset of injury/illness/exacerbation;Fitness;Past/Current Experience    Comorbidities Relevant past medical history and comorbidities include slightly enlarged heart, bronchiectasis, symptomatic asthma and allergies, gradual unintensional weight loss (10 lbs over years? Patient unclear), ovarian cysts and was  referred to oncologist and supposed to have a scan (October), increased urgency for bladder recently, back pain with bulging disk, fibromyalgia, R knee meniscus tear with surgery in 2013,  raynaud's, IBS, insomnia, GERD, hx left hip bursitis, gait abnormality, chest pain    Examination-Activity Limitations Bathing;Hygiene/Grooming;Squat;Lift;Stairs;Bed Mobility;Bend;Locomotion Level;Stand;Caring for Others;Reach Overhead;Carry;Transfers;Sit;Dressing;Sleep  Examination-Participation Restrictions Laundry;Cleaning;Community Activity;Interpersonal Relationship;Meal Prep;Shop    Stability/Clinical Decision Making Evolving/Moderate complexity    Rehab Potential Fair    PT Frequency 2x / week    PT Duration 12 weeks    PT Treatment/Interventions ADLs/Self Care Home Management;Aquatic Therapy;Cryotherapy;Moist Heat;Electrical Stimulation;Gait training;Stair training;Functional mobility training;Therapeutic activities;Therapeutic exercise;Balance training;Neuromuscular re-education;Patient/family education;Manual techniques;Dry needling;Passive range of motion;Energy conservation;Spinal Manipulations;Joint Manipulations    PT Next Visit Plan assess response to HEP, graded exercise for improved strength and ROM    PT Home Exercise Plan Medbridge Access Code: [email or web address]    Consulted and Agree with Plan of Care Patient           Patient will benefit from skilled therapeutic intervention in order to improve the following deficits and impairments:  Abnormal gait, Pain, Decreased mobility, Decreased coordination, Decreased activity tolerance, Decreased endurance, Decreased range of motion, Decreased strength, Hypomobility, Impaired perceived functional ability, Impaired UE functional use, Impaired flexibility, Difficulty walking, Decreased balance  Visit Diagnosis: Fibromyalgia  Muscle weakness (generalized)  Difficulty in walking, not elsewhere classified  Chronic right shoulder pain  Chronic left  shoulder pain     Problem List Patient Active Problem List   Diagnosis Date Noted  . Tinea versicolor 05/30/2020  . Shoulder pain 12/24/2019  . Myalgia 12/24/2019  . Bronchiectasis without complication ([place]) 65/46/5035  . Dysphagia 07/23/2019  . Abdominal pain 07/23/2019  . Nonintractable headache 03/19/2019  . Paresthesia 03/19/2019  . Decreased grip strength 02/04/2019  . Itching 02/02/2019  . Local superficial swelling 02/02/2019  . Weight loss 02/02/2019  . Osteopenia 08/09/2018  . Chronic rhinitis 07/23/2018  . 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 Healthcare maintenance 07/16/2018  . Moderate persistent asthma with acute exacerbation 01/30/2018  . Advance care planning 01/02/2018  . Vitamin D deficiency 07/08/2017  . 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/19/2017  . Primary osteoarthritis of both knees 01/19/2017  . Primary osteoarthritis of both feet 01/19/2017  . Cough 11/14/2016  . SOB (shortness of breath) 11/14/2016  . Diverticulosis 03/19/2016  . Lumbar compression fracture ([place]) 06/20/2015  . Fibromyalgia 02/26/2013  . Allergic rhinitis due to pollen 11/20/2010  . 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 IRRITABLE BOWEL SYNDROME 04/06/2010  . THYROID NODULE, RIGHT 12/07/2009  . OSTEOARTHRITIS 11/28/2009  . FIBROIDS, UTERUS 05/10/2008  . Insomnia 05/10/2008  . ASYMPTOMATIC POSTMENOPAUSAL STATUS 05/10/2008  . Essential hypertension 01/28/2008  . HYPERCHOLESTEROLEMIA 01/07/2008  . GLAUCOMA 01/07/2008  . Gastroesophageal reflux disease 04/14/2007  . Asthma 04/14/2007    Everlean Alstrom. Graylon Good, PT, DPT 07/07/20, 2:15 PM  [facility] [address], [place], [address] Phone: [phone]   Fax:  [phone]  Name: Cayenne Breault MRN: [id number] Date of Birth: 1945/10/29

## 2020-07-11 ENCOUNTER — Encounter: Payer: Self-pay | Admitting: Physician Assistant

## 2020-07-11 ENCOUNTER — Other Ambulatory Visit: Payer: Self-pay

## 2020-07-11 ENCOUNTER — Ambulatory Visit (INDEPENDENT_AMBULATORY_CARE_PROVIDER_SITE_OTHER): Payer: Medicare Other | Admitting: Physician Assistant

## 2020-07-11 VITALS — BP 140/69 | HR 64 | Resp 16 | Ht 62.0 in | Wt 136.0 lb

## 2020-07-11 DIAGNOSIS — M19042 Primary osteoarthritis, left hand: Secondary | ICD-10-CM

## 2020-07-11 DIAGNOSIS — M170 Bilateral primary osteoarthritis of knee: Secondary | ICD-10-CM | POA: Diagnosis not present

## 2020-07-11 DIAGNOSIS — M797 Fibromyalgia: Secondary | ICD-10-CM

## 2020-07-11 DIAGNOSIS — M19072 Primary osteoarthritis, left ankle and foot: Secondary | ICD-10-CM

## 2020-07-11 DIAGNOSIS — Z8719 Personal history of other diseases of the digestive system: Secondary | ICD-10-CM

## 2020-07-11 DIAGNOSIS — Z8669 Personal history of other diseases of the nervous system and sense organs: Secondary | ICD-10-CM

## 2020-07-11 DIAGNOSIS — M160 Bilateral primary osteoarthritis of hip: Secondary | ICD-10-CM

## 2020-07-11 DIAGNOSIS — M19041 Primary osteoarthritis, right hand: Secondary | ICD-10-CM

## 2020-07-11 DIAGNOSIS — G5603 Carpal tunnel syndrome, bilateral upper limbs: Secondary | ICD-10-CM

## 2020-07-11 DIAGNOSIS — Z8679 Personal history of other diseases of the circulatory system: Secondary | ICD-10-CM

## 2020-07-11 DIAGNOSIS — R5383 Other fatigue: Secondary | ICD-10-CM

## 2020-07-11 DIAGNOSIS — M19071 Primary osteoarthritis, right ankle and foot: Secondary | ICD-10-CM

## 2020-07-11 DIAGNOSIS — Z87442 Personal history of urinary calculi: Secondary | ICD-10-CM

## 2020-07-11 DIAGNOSIS — R519 Headache, unspecified: Secondary | ICD-10-CM

## 2020-07-11 DIAGNOSIS — M19011 Primary osteoarthritis, right shoulder: Secondary | ICD-10-CM

## 2020-07-11 DIAGNOSIS — Z8781 Personal history of (healed) traumatic fracture: Secondary | ICD-10-CM

## 2020-07-11 DIAGNOSIS — Z8709 Personal history of other diseases of the respiratory system: Secondary | ICD-10-CM

## 2020-07-11 DIAGNOSIS — M19012 Primary osteoarthritis, left shoulder: Secondary | ICD-10-CM

## 2020-07-11 DIAGNOSIS — G4709 Other insomnia: Secondary | ICD-10-CM

## 2020-07-11 DIAGNOSIS — Z8639 Personal history of other endocrine, nutritional and metabolic disease: Secondary | ICD-10-CM

## 2020-07-12 ENCOUNTER — Other Ambulatory Visit: Payer: Medicare Other

## 2020-07-12 ENCOUNTER — Ambulatory Visit (INDEPENDENT_AMBULATORY_CARE_PROVIDER_SITE_OTHER): Payer: Medicare Other

## 2020-07-12 ENCOUNTER — Other Ambulatory Visit: Payer: Self-pay

## 2020-07-12 ENCOUNTER — Other Ambulatory Visit (INDEPENDENT_AMBULATORY_CARE_PROVIDER_SITE_OTHER): Payer: Medicare Other

## 2020-07-12 DIAGNOSIS — Z0000 Encounter for general adult medical examination without abnormal findings: Secondary | ICD-10-CM

## 2020-07-12 DIAGNOSIS — I10 Essential (primary) hypertension: Secondary | ICD-10-CM | POA: Diagnosis not present

## 2020-07-12 DIAGNOSIS — E559 Vitamin D deficiency, unspecified: Secondary | ICD-10-CM

## 2020-07-12 LAB — COMPREHENSIVE METABOLIC PANEL
ALT: 8 U/L (ref 0–35)
AST: 15 U/L (ref 0–37)
Albumin: 4.3 g/dL (ref 3.5–5.2)
Alkaline Phosphatase: 53 U/L (ref 39–117)
BUN: 17 mg/dL (ref 6–23)
CO2: 31 mEq/L (ref 19–32)
Calcium: 9.4 mg/dL (ref 8.4–10.5)
Chloride: 101 mEq/L (ref 96–112)
Creatinine, Ser: 0.88 mg/dL (ref 0.40–1.20)
GFR: 64.51 mL/min (ref >60.00)
Glucose, Bld: 75 mg/dL (ref 70–99)
Potassium: 4.2 mEq/L (ref 3.5–5.1)
Sodium: 138 mEq/L (ref 135–145)
Total Bilirubin: 0.5 mg/dL (ref 0.2–1.2)
Total Protein: 6.8 g/dL (ref 6.0–8.3)

## 2020-07-12 LAB — LIPID PANEL
Cholesterol: 220 mg/dL — ABNORMAL HIGH (ref 0–200)
HDL: 121.2 mg/dL (ref >39.00)
LDL Cholesterol: 88 mg/dL (ref 0–99)
NonHDL: 98.45
Total CHOL/HDL Ratio: 2
Triglycerides: 52 mg/dL (ref 0.0–149.0)
VLDL: 10.4 mg/dL (ref 0.0–40.0)

## 2020-07-12 LAB — TSH: TSH: 2.39 u[IU]/mL (ref 0.35–4.50)

## 2020-07-12 LAB — VITAMIN D 25 HYDROXY (VIT D DEFICIENCY, FRACTURES): VITD: 39.17 ng/mL (ref 30.00–100.00)

## 2020-07-12 NOTE — Patient Instructions (Signed)
Samantha [name] , Thank you for taking time to come for your Medicare Wellness Visit. I appreciate your ongoing commitment to your health goals. Please review the following plan we discussed and let me know if I can assist you in the future.   Screening recommendations/referrals: Colonoscopy: Up to date, completed 01/15/2020, due 01/2025 Mammogram: Up to date, completed 12/30/2019, due 12/2020 Bone Density: Up to date, completed 08/04/2018, due 08/04/2020 Recommended yearly ophthalmology/optometry visit for glaucoma screening and checkup Recommended yearly dental visit for hygiene and checkup  Vaccinations: Influenza vaccine: Up to date, completed 05/24/2020, due 04/2021 Pneumococcal vaccine: Completed series Tdap vaccine: Up to date, completed 07/03/2013, due 06/2023 Shingles vaccine: Completed series   Covid-19:Completed series  Advanced directives: Advance directive discussed with you today. Even though you declined this today please call our office should you change your mind and we can give you the proper paperwork for you to fill out.  Conditions/risks identified: hypertension, hypercholesterolemia  Next appointment: Follow up in one year for your annual wellness visit    Preventive Care 68 Years and Older, Female Preventive care refers to lifestyle choices and visits with your health care provider that can promote health and wellness. What does preventive care include?  A yearly physical exam. This is also called an annual well check.  Dental exams once or twice a year.  Routine eye exams. Ask your health care provider how often you should have your eyes checked.  Personal lifestyle choices, including:  Daily care of your teeth and gums.  Regular physical activity.  Eating a healthy diet.  Avoiding tobacco and drug use.  Limiting alcohol use.  Practicing safe sex.  Taking low-dose aspirin every day.  Taking vitamin and mineral supplements as recommended by your health  care provider. What happens during an annual well check? The services and screenings done by your health care provider during your annual well check will depend on your age, overall health, lifestyle risk factors, and family history of disease. Counseling  Your health care provider may ask you questions about your:  Alcohol use.  Tobacco use.  Drug use.  Emotional well-being.  Home and relationship well-being.  Sexual activity.  Eating habits.  History of falls.  Memory and ability to understand (cognition).  Work and work Statistician.  Reproductive health. Screening  You may have the following tests or measurements:  Height, weight, and BMI.  Blood pressure.  Lipid and cholesterol levels. These may be checked every 5 years, or more frequently if you are over 3 years old.  Skin check.  Lung cancer screening. You may have this screening every year starting at age 75 if you have a 30-pack-year history of smoking and currently smoke or have quit within the past 15 years.  Fecal occult blood test (FOBT) of the stool. You may have this test every year starting at age 76.  Flexible sigmoidoscopy or colonoscopy. You may have a sigmoidoscopy every 5 years or a colonoscopy every 10 years starting at age 8.  Hepatitis C blood test.  Hepatitis B blood test.  Sexually transmitted disease (STD) testing.  Diabetes screening. This is done by checking your blood sugar (glucose) after you have not eaten for a while (fasting). You may have this done every 1-3 years.  Bone density scan. This is done to screen for osteoporosis. You may have this done starting at age 52.  Mammogram. This may be done every 1-2 years. Talk to your health care provider about how often you should  have regular mammograms. Talk with your health care provider about your test results, treatment options, and if necessary, the need for more tests. Vaccines  Your health care provider may recommend certain  vaccines, such as:  Influenza vaccine. This is recommended every year.  Tetanus, diphtheria, and acellular pertussis (Tdap, Td) vaccine. You may need a Td booster every 10 years.  Zoster vaccine. You may need this after age 56.  Pneumococcal 13-valent conjugate (PCV13) vaccine. One dose is recommended after age 2.  Pneumococcal polysaccharide (PPSV23) vaccine. One dose is recommended after age 30. Talk to your health care provider about which screenings and vaccines you need and how often you need them. This information is not intended to replace advice given to you by your health care provider. Make sure you discuss any questions you have with your health care provider. Document Released: 09/23/2015 Document Revised: 05/16/2016 Document Reviewed: 06/28/2015 Elsevier Interactive Patient Education  2017 [place] Prevention in the Home Falls can cause injuries. They can happen to people of all ages. There are many things you can do to make your home safe and to help prevent falls. What can I do on the outside of my home?  Regularly fix the edges of walkways and driveways and fix any cracks.  Remove anything that might make you trip as you walk through a door, such as a raised step or threshold.  Trim any bushes or trees on the path to your home.  Use bright outdoor lighting.  Clear any walking paths of anything that might make someone trip, such as rocks or tools.  Regularly check to see if handrails are loose or broken. Make sure that both sides of any steps have handrails.  Any raised decks and porches should have guardrails on the edges.  Have any leaves, snow, or ice cleared regularly.  Use sand or salt on walking paths during winter.  Clean up any spills in your garage right away. This includes oil or grease spills. What can I do in the bathroom?  Use night lights.  Install grab bars by the toilet and in the tub and shower. Do not use towel bars as grab  bars.  Use non-skid mats or decals in the tub or shower.  If you need to sit down in the shower, use a plastic, non-slip stool.  Keep the floor dry. Clean up any water that spills on the floor as soon as it happens.  Remove soap buildup in the tub or shower regularly.  Attach bath mats securely with double-sided non-slip rug tape.  Do not have throw rugs and other things on the floor that can make you trip. What can I do in the bedroom?  Use night lights.  Make sure that you have a light by your bed that is easy to reach.  Do not use any sheets or blankets that are too big for your bed. They should not hang down onto the floor.  Have a firm chair that has side arms. You can use this for support while you get dressed.  Do not have throw rugs and other things on the floor that can make you trip. What can I do in the kitchen?  Clean up any spills right away.  Avoid walking on wet floors.  Keep items that you use a lot in easy-to-reach places.  If you need to reach something above you, use a strong step stool that has a grab bar.  Keep electrical cords out of  the way.  Do not use floor polish or wax that makes floors slippery. If you must use wax, use non-skid floor wax.  Do not have throw rugs and other things on the floor that can make you trip. What can I do with my stairs?  Do not leave any items on the stairs.  Make sure that there are handrails on both sides of the stairs and use them. Fix handrails that are broken or loose. Make sure that handrails are as long as the stairways.  Check any carpeting to make sure that it is firmly attached to the stairs. Fix any carpet that is loose or worn.  Avoid having throw rugs at the top or bottom of the stairs. If you do have throw rugs, attach them to the floor with carpet tape.  Make sure that you have a light switch at the top of the stairs and the bottom of the stairs. If you do not have them, ask someone to add them for  you. What else can I do to help prevent falls?  Wear shoes that:  Do not have high heels.  Have rubber bottoms.  Are comfortable and fit you well.  Are closed at the toe. Do not wear sandals.  If you use a stepladder:  Make sure that it is fully opened. Do not climb a closed stepladder.  Make sure that both sides of the stepladder are locked into place.  Ask someone to hold it for you, if possible.  Clearly mark and make sure that you can see:  Any grab bars or handrails.  First and last steps.  Where the edge of each step is.  Use tools that help you move around (mobility aids) if they are needed. These include:  Canes.  Walkers.  Scooters.  Crutches.  Turn on the lights when you go into a dark area. Replace any light bulbs as soon as they burn out.  Set up your furniture so you have a clear path. Avoid moving your furniture around.  If any of your floors are uneven, fix them.  If there are any pets around you, be aware of where they are.  Review your medicines with your doctor. Some medicines can make you feel dizzy. This can increase your chance of falling. Ask your doctor what other things that you can do to help prevent falls. This information is not intended to replace advice given to you by your health care provider. Make sure you discuss any questions you have with your health care provider. Document Released: 06/23/2009 Document Revised: 02/02/2016 Document Reviewed: 10/01/2014 Elsevier Interactive Patient Education  2017 [employer].

## 2020-07-12 NOTE — Progress Notes (Signed)
PCP notes:  Health Maintenance: No gaps noted   Abnormal Screenings: none   Patient concerns: none   Nurse concerns: none   Next PCP appt.: 07/19/2020 @ 11 am

## 2020-07-12 NOTE — Progress Notes (Signed)
Subjective:   Samantha [name] is a 74 y.o. female who presents for Medicare Annual (Subsequent) preventive examination.  Review of Systems: N/A      I connected with the patient today by telephone and verified that I am speaking with the correct person using two identifiers. Location patient: home Location nurse: work Persons participating in the telephone visit: patient, nurse.   I discussed the limitations, risks, security and privacy concerns of performing an evaluation and management service by telephone and the availability of in person appointments. I also discussed with the patient that there may be a patient responsible charge related to this service. The patient expressed understanding and verbally consented to this telephonic visit.        Cardiac Risk Factors include: advanced age (>58men, >53 women);hypertension;Other (see comment), Risk factor comments: hypercholesterolemia     Objective:    Today's Vitals   There is no height or weight on file to calculate BMI.  Advanced Directives 07/12/2020 05/23/2020 07/13/2019 03/10/2019 07/09/2018 07/15/2017 07/03/2017  Does Patient Have a Medical Advance Directive? No Yes No Yes No No No  Type of Advance Directive - - - [place]  Does patient want to make changes to medical advance directive? - No - Patient declined - - - Yes (MAU/Ambulatory/Procedural Areas - Information given) Yes (MAU/Ambulatory/Procedural Areas - Information given)  Copy of [place] in Chart? - - - - - - -  Would patient like information on creating a medical advance directive? No - Patient declined - Yes (MAU/Ambulatory/Procedural Areas - Information given) - Yes (MAU/Ambulatory/Procedural Areas - Information given) - -    Current Medications (verified) Outpatient Encounter Medications as of 07/12/2020  Medication Sig  . acetaminophen (TYLENOL) 500 MG tablet Take 1,000 mg by mouth every 6 (six) hours as needed  for mild pain or headache.  . albuterol (VENTOLIN HFA) 108 (90 Base) MCG/ACT inhaler Inhale 2 puffs into the lungs every 4 (four) hours as needed for wheezing or shortness of breath.  . Azelastine HCl 0.15 % SOLN Place 2 sprays into both nostrils 2 (two) times daily as needed (runny nose).  . Beclomethasone Dipropionate (QNASL) 80 MCG/ACT AERS Place 2 sprays into both nostrils daily. Two sprays into both nostrils daily  . benzonatate (TESSALON) 200 MG capsule Take 1 capsule (200 mg total) by mouth 3 (three) times daily as needed for cough.  . bifidobacterium infantis (ALIGN) capsule Take 1 capsule by mouth daily.    . budesonide-formoterol (SYMBICORT) 160-4.5 MCG/ACT inhaler Inhale 2 puffs into the lungs 2 (two) times daily.  . cetirizine (ZYRTEC) 10 MG tablet Take 10 mg by mouth at bedtime.   . Cholecalciferol (VITAMIN D) 50 MCG (2000 UT) CAPS Take 1 capsule by mouth daily.  Marland Kitchen losartan-hydrochlorothiazide (HYZAAR) 100-12.5 MG tablet Take 1 tablet by mouth daily.  . Magnesium Malate POWD (1250mg  tabs) 1 tablet by mouth qhs  . Manganese 50 MG TABS Take 50 mg by mouth at bedtime.   . Misc Natural Products (TART CHERRY ADVANCED PO) Take 2 tablets by mouth daily.   . montelukast (SINGULAIR) 10 MG tablet Take 1 tablet (10 mg total) by mouth at bedtime.  . Olopatadine HCl (PATADAY) 0.2 % SOLN Place 1 drop into both eyes daily as needed (itchy eyes).  . pantoprazole (PROTONIX) 40 MG tablet Take 1 tablet (40 mg total) by mouth 2 (two) times daily.  Vladimir Faster Glycol-Propyl Glycol (SYSTANE) 0.4-0.3 % GEL ophthalmic gel Place  1 application into both eyes every 6 (six) hours as needed (dry eyes).  . thiamine (VITAMIN B-1) 50 MG tablet Take 100 mg by mouth daily.  . TURMERIC PO Take 1 tablet by mouth daily.    No facility-administered encounter medications on file as of 07/12/2020.    Allergies (verified) Cefuroxime axetil, Doxycycline, Latex, Lisinopril, Lovastatin, and Sulfonamide derivatives    History: Past Medical History:  Diagnosis Date  . ALLERGIC RHINITIS 07/26/2008  . ASTHMA 04/14/2007  . Asthma   . ASYMPTOMATIC POSTMENOPAUSAL STATUS 05/10/2008  . Bronchiectasis ([place])   . CHEST PAIN 08/26/2008  . COLONIC POLYPS, HX OF 04/14/2007   colonic leiomyoma  . Decreased grip strength   . Episcleritis   . FIBROIDS, UTERUS 05/10/2008  . Fibromyalgia   . Gait abnormality   . GERD 04/14/2007  . HYPERCHOLESTEROLEMIA 01/07/2008  . HYPERGLYCEMIA 11/28/2009  . HYPERTENSION 01/28/2008  . INSOMNIA 05/10/2008  . Irritable bowel syndrome 04/06/2010  . OSTEOARTHRITIS 11/28/2009  . 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 SINUSITIS 02/10/2009   Past Surgical History:  Procedure Laterality Date  . COLONOSCOPY W/ POLYPECTOMY    . ELECTROCARDIOGRAM  12/04/2006  . IR RADIOLOGY PERIPHERAL GUIDED IV START  04/21/2018  . IR US GUIDE VASC ACCESS LEFT  04/21/2018  . NASAL SEPTUM SURGERY    . Stress Cardiolite  02/13/2002   Family History  Problem Relation Age of Onset  . Diabetes Mother   . Hypertension Mother   . Glaucoma Mother   . Polymyositis Mother   . Heart disease Father        CHF  . Allergic rhinitis Father   . Asthma Father   . Other Father        sideoblastic anemia  . Allergic rhinitis Sister   . Asthma Sister   . Allergic rhinitis Brother   . Asthma Brother   . Prostate cancer Brother   . Allergic rhinitis Maternal Aunt   . Asthma Maternal Aunt   . Allergic rhinitis Paternal Aunt   . Asthma Paternal Aunt   . Breast cancer Cousin   . Colon cancer Cousin   . Cancer Neg Hx        No FH of Colon Cancer  . Angioedema Neg Hx   . Atopy Neg Hx   . Immunodeficiency Neg Hx   . Urticaria Neg Hx   . Eczema Neg Hx    Social History   Socioeconomic History  . Marital status: Widowed    Spouse name: Not on file  . Number of children: 3  . Years of education: some college  . Highest education level: Not on file  Occupational History    Employer: RETIRED    Comment: Retired Psychologist, clinical)  Tobacco Use  . Smoking status: Never Smoker  . Smokeless tobacco: Never Used  Vaping Use  . Vaping Use: Never used  Substance and Sexual Activity  . Alcohol use: No  . Drug use: No  . Sexual activity: Never  Other Topics Concern  . Not on file  Social History Narrative   Lives alone (widowed 1998).   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      Does not have living will.  Does desire CPR.  Does not want life support for prolonged periods of time if futile.   2 sons and 1 daughter.        Right-handed.   No daily caffeine use.   Social Determinants of Radio broadcast assistant  Strain: Low Risk   . Difficulty of Paying Living Expenses: Not hard at all  Food Insecurity: No Food Insecurity  . Worried About Charity fundraiser in the Last Year: Never true  . Ran Out of Food in the Last Year: Never true  Transportation Needs: No Transportation Needs  . Lack of Transportation (Medical): No  . Lack of Transportation (Non-Medical): No  Physical Activity: Insufficiently Active  . Days of Exercise per Week: 2 days  . Minutes of Exercise per Session: 60 min  Stress: No Stress Concern Present  . Feeling of Stress : Not at all  Social Connections:   . Frequency of Communication with Friends and Family: Not on file  . Frequency of Social Gatherings with Friends and Family: Not on file  . Attends Religious Services: Not on file  . Active Member of Clubs or Organizations: Not on file  . Attends Archivist Meetings: Not on file  . Marital Status: Not on file    Tobacco Counseling Counseling given: Not Answered   Clinical Intake:  Pre-visit preparation completed: Yes  Pain : No/denies pain     Nutritional Risks: None Diabetes: No  How often do you need to have someone help you when you read instructions, pamphlets, or other written materials from your doctor or pharmacy?: 1 - Never What is the last grade level you completed in school?: some  college  Diabetic: No Nutrition Risk Assessment:  Has the patient had any N/V/D within the last 2 months?  No  Does the patient have any non-healing wounds?  No  Has the patient had any unintentional weight loss or weight gain?  No   Diabetes:  Is the patient diabetic?  No  If diabetic, was a CBG obtained today?  N/A Did the patient bring in their glucometer from home?  N/A How often do you monitor your CBG's? N/A.   Financial Strains and Diabetes Management:  Are you having any financial strains with the device, your supplies or your medication? N/A.  Does the patient want to be seen by Chronic Care Management for management of their diabetes?  N/A Would the patient like to be referred to a Nutritionist or for Diabetic Management?  N/A    Interpreter Needed?: No  Information entered by :: CJohnson, LPN   Activities of Daily Living In your present state of health, do you have any difficulty performing the following activities: 07/12/2020 07/13/2019  Hearing? N N  Vision? N N  Difficulty concentrating or making decisions? N Y  Comment - forgets what she just read sometimes  Walking or climbing stairs? N Y  Comment - patient gets short of breath when climbing stairs  Dressing or bathing? N N  Doing errands, shopping? N N  Preparing Food and eating ? N N  Using the Toilet? N N  In the past six months, have you accidently leaked urine? N N  Do you have problems with loss of bowel control? N N  Managing your Medications? N N  Managing your Finances? N N  Housekeeping or managing your Housekeeping? N N  Some recent data might be hidden    Patient Care Team: Tonia Ghent, MD as PCP - General (Family Medicine) Deneise Lever, MD (Pulmonary Disease) Ladene Artist, MD (Gastroenterology) Bo Merino, MD (Rheumatology) Arvella Nigh, MD as Consulting Physician (Obstetrics and Gynecology) Leandrew Koyanagi, MD as Referring Physician (Ophthalmology) Melissa Montane, MD as Consulting Physician (Otolaryngology) Melrose Nakayama, MD as  Consulting Physician (Orthopedic Surgery) Druscilla Brownie, MD as Consulting Physician (Dermatology) Everitt Amber, MD as Consulting Physician (Obstetrics and Gynecology) Otis Brace, MD as Consulting Physician (Gastroenterology) Arvella Nigh, MD as Consulting Physician (Obstetrics and Gynecology) Neldon Mc Donnamarie Poag, MD as Consulting Physician (Allergy and Immunology)  Indicate any recent Medical Services you may have received from other than [facility] providers in the past year (date may be approximate).     Assessment:   This is a routine wellness examination for [place].  Hearing/Vision screen  Hearing Screening   125Hz  250Hz  500Hz  1000Hz  2000Hz  3000Hz  4000Hz  6000Hz  8000Hz   Right ear:           Left ear:           Vision Screening Comments: Patient gets annual eye exams  Dietary issues and exercise activities discussed: Current Exercise Habits: Structured exercise class, Type of exercise: Other - see comments (physical therapy), Time (Minutes): 60, Frequency (Times/Week): 2, Weekly Exercise (Minutes/Week): 120, Intensity: Moderate, Exercise limited by: None identified  Goals    . Increase physical activity     Starting 07/09/2018, I will continue to drink at least 6-8 glasses of water daily.     . Patient Stated     07/13/2019, I will exercise more daily and try to walk on a daily basis.     . Patient Stated     07/12/2020, I will continue physical therapy 2 days a week for 1 hour.      Depression Screen PHQ 2/9 Scores 07/12/2020 07/13/2019 07/09/2018 07/03/2017 06/27/2016 06/08/2015 06/02/2014  PHQ - 2 Score 0 0 0 1 0 0 0  PHQ- 9 Score 0 0 0 7 - - -    Fall Risk Fall Risk  07/12/2020 07/13/2019 07/28/2018 07/09/2018 07/03/2017  Falls in the past year? 0 0 0 No No  Comment - - Emmi Telephone Survey: data to providers prior to load - -  Number falls in past yr: 0 0 - - -  Injury with Fall? 0 0 - - -  Risk for  fall due to : Medication side effect Medication side effect;Impaired balance/gait - - -  Follow up Falls evaluation completed;Falls prevention discussed Falls evaluation completed;Falls prevention discussed - - -    Any stairs in or around the home? Yes  If so, are there any without handrails? No  Home free of loose throw rugs in walkways, pet beds, electrical cords, etc? Yes  Adequate lighting in your home to reduce risk of falls? Yes   ASSISTIVE DEVICES UTILIZED TO PREVENT FALLS:  Life alert? No  Use of a cane, walker or w/c? No  Grab bars in the bathroom? No  Shower chair or bench in shower? No  Elevated toilet seat or a handicapped toilet? No   TIMED UP AND GO:  Was the test performed? N/A, telephonic visit .    Cognitive Function: MMSE - Mini Mental State Exam 07/12/2020 07/13/2019 07/09/2018 07/03/2017 06/27/2016  Orientation to time 5 5 5 5 5   Orientation to Place 5 5 5 5 5   Registration 3 3 3 3 3   Attention/ Calculation 5 5 0 0 0  Recall 2 3 3 3 3   Language- name 2 objects - - 0 0 0  Language- repeat 1 1 1 1 1   Language- follow 3 step command - - 3 3 3   Language- read & follow direction - - 0 0 0  Write a sentence - - 0 0 0  Copy design - - 0  0 0  Total score - - 20 20 20   Mini Cog  Mini-Cog screen was completed. Maximum score is 22. A value of 0 denotes this part of the MMSE was not completed or the patient failed this part of the Mini-Cog screening.       Immunizations Immunization History  Administered Date(s) Administered  . Fluad Quad(high Dose 65+) 05/07/2019, 05/24/2020  . Influenza Split 06/04/2011, 05/27/2012  . Influenza Whole 08/18/2008, 05/26/2010  . Influenza, High Dose Seasonal PF 05/22/2017, 06/11/2018  . Influenza,inj,Quad PF,6+ Mos 06/08/2013, 05/19/2014, 06/08/2015, 05/07/2016  . PFIZER SARS-COV-2 Vaccination 10/27/2019, 11/17/2019  . Pneumococcal Conjugate-13 03/17/2014  . Pneumococcal Polysaccharide-23 11/23/2009, 06/08/2015, 07/14/2018   . Td 11/09/2002  . Tdap 07/03/2013  . Zoster 06/17/2013  . Zoster Recombinat (Shingrix) 02/19/2019, 08/27/2019    TDAP status: Up to date Flu Vaccine status: Up to date Pneumococcal vaccine status: Up to date Covid-19 vaccine status: Completed vaccines  Qualifies for Shingles Vaccine? Yes   Zostavax completed Yes   Shingrix Completed?: Yes  Screening Tests Health Maintenance  Topic Date Due  . MAMMOGRAM  12/29/2021  . TETANUS/TDAP  07/04/2023  . COLONOSCOPY  01/14/2025  . INFLUENZA VACCINE  Completed  . DEXA SCAN  Completed  . COVID-19 Vaccine  Completed  . Hepatitis C Screening  Completed  . PNA vac Low Risk Adult  Completed    Health Maintenance  There are no preventive care reminders to display for this patient.  Colorectal cancer screening: colonoscopy completed 01/15/2020. Repeat every 5 years Mammogram status: Completed 12/30/2019. Repeat every year Bone Density status: Completed 08/04/2018. Results reflect: Bone density results: OSTEOPENIA. Repeat every 2 years.  Lung Cancer Screening: (Low Dose CT Chest recommended if Age 80-80 years, 30 pack-year currently smoking OR have quit w/in 15 years.) does not qualify.    Additional Screening:  Hepatitis C Screening: does qualify; Completed 06/27/2016  Vision Screening: Recommended annual ophthalmology exams for early detection of glaucoma and other disorders of the eye. Is the patient up to date with their annual eye exam?  Yes  Who is the provider or what is the name of the office in which the patient attends annual eye exams? Dr. Leandrew Koyanagi  If pt is not established with a provider, would they like to be referred to a provider to establish care? No .   Dental Screening: Recommended annual dental exams for proper oral hygiene  Community Resource Referral / Chronic Care Management: CRR required this visit?  No   CCM required this visit?  No      Plan:     I have personally reviewed and noted the  following in the patient's chart:   . Medical and social history . Use of alcohol, tobacco or illicit drugs  . Current medications and supplements . Functional ability and status . Nutritional status . Physical activity . Advanced directives . List of other physicians . Hospitalizations, surgeries, and ER visits in previous 12 months . Vitals . Screenings to include cognitive, depression, and falls . Referrals and appointments  In addition, I have reviewed and discussed with patient certain preventive protocols, quality metrics, and best practice recommendations. A written personalized care plan for preventive services as well as general preventive health recommendations were provided to patient.   Due to this being a telephonic visit, the after visit summary with patients personalized plan was offered to patient via office or my-chart.  Patient preferred to pick up at office at next visit or via mychart.  Andrez Grime, LPN   [id number]

## 2020-07-19 ENCOUNTER — Encounter: Payer: Self-pay | Admitting: Family Medicine

## 2020-07-19 ENCOUNTER — Other Ambulatory Visit: Payer: Self-pay

## 2020-07-19 ENCOUNTER — Ambulatory Visit (INDEPENDENT_AMBULATORY_CARE_PROVIDER_SITE_OTHER): Payer: Medicare Other | Admitting: Family Medicine

## 2020-07-19 DIAGNOSIS — J45909 Unspecified asthma, uncomplicated: Secondary | ICD-10-CM

## 2020-07-19 DIAGNOSIS — Z0000 Encounter for general adult medical examination without abnormal findings: Secondary | ICD-10-CM

## 2020-07-19 DIAGNOSIS — I10 Essential (primary) hypertension: Secondary | ICD-10-CM | POA: Diagnosis not present

## 2020-07-19 DIAGNOSIS — Z66 Do not resuscitate: Secondary | ICD-10-CM

## 2020-07-19 DIAGNOSIS — Z7189 Other specified counseling: Secondary | ICD-10-CM

## 2020-07-19 DIAGNOSIS — M797 Fibromyalgia: Secondary | ICD-10-CM

## 2020-07-19 NOTE — Progress Notes (Signed)
This visit occurred during the SARS-CoV-2 public health emergency.  Safety protocols were in place, including screening questions prior to the visit, additional usage of staff PPE, and extensive cleaning of exam room while observing appropriate contact time as indicated for disinfecting solutions.  Living will d/w pt. Daughter Colletta [place] designated if patient were incapacitated in an emergency.  Patient would like attempts to be made to contact all of her kids (including her two sons) so they can be involved in decisions if at all possible.    She clearly started that she wants to be a DNR.  If she has no pulse or breathing, then she want to be left in peace and have not have CPR done.    Her brother spent months in a coma prior to death and she doesn't want to go through anything like that.  D/w pt.  DNR form done at [place].  Hypertension:  Using medication without problems or lightheadedness: yes Chest pain with exertion: no consistent sx.   Edema: rare BLE edema.  It self resolves.   Short of breath: see below.   Labs d/w pt.    She had seen the pulmonary clinic with compliance with baseline meds.  Her breathing is better in the meantime.  She has some occ cough.    H/o fibromyalgia at baseline.  She had rheumatology f/u.  She has been in PT.  She had covid vaccine.  Cautions d/w pt.  Church was doing [employer].   Other vaccines up to date.   Colon 2018 Pap not due.  Mammogram 2021 DXA 2019, reasonable to defer 2021 given pandemic, d/w pt.   Meds, vitals, and allergies reviewed.  ROS: Per HPI unless specifically indicated in ROS section   GEN: nad, alert and oriented HEENT: ncat NECK: supple w/o LA CV: rrr PULM: ctab, no inc wob ABD: soft, +bs EXT: no edema SKIN: no acute rash  At least 30 minutes were devoted to patient care in this encounter (this can potentially include time spent reviewing the patient's file/history, interviewing and examining the patient,  counseling/reviewing plan with patient, ordering referrals, ordering tests, reviewing relevant laboratory or x-ray data, and documenting the encounter).

## 2020-07-19 NOTE — Patient Instructions (Addendum)
Living will d/w pt. Daughter Colletta [place] designated if patient were incapacitated in an emergency.  Patient would like attempts to be made to contact all of her kids (including her two sons) so they can be involved in decisions if at all possible.    Your labs are fine.  Update me as needed.   Take care.  Glad to see you.

## 2020-07-20 ENCOUNTER — Ambulatory Visit: Payer: Medicare Other | Admitting: Physical Therapy

## 2020-07-21 ENCOUNTER — Ambulatory Visit: Payer: Medicare Other | Admitting: Physical Therapy

## 2020-07-24 DIAGNOSIS — Z66 Do not resuscitate: Secondary | ICD-10-CM | POA: Insufficient documentation

## 2020-07-24 NOTE — Assessment & Plan Note (Signed)
  Living will d/w pt. Daughter Colletta [place] designated if patient were incapacitated in an emergency.  Patient would like attempts to be made to contact all of her kids (including her two sons) so they can be involved in decisions if at all possible.    She clearly started that she wants to be a DNR.  If she has no pulse or breathing, then she want to be left in peace and have not have CPR done.    Her brother spent months in a coma prior to death and she doesn't want to go through anything like that.  D/w pt.  DNR form done at [place].

## 2020-07-24 NOTE — Assessment & Plan Note (Addendum)
  Living will d/w pt. Daughter Colletta [place] designated if patient were incapacitated in an emergency.  Patient would like attempts to be made to contact all of her kids (including her two sons) so they can be involved in decisions if at all possible.    She clearly started that she wants to be a DNR.  If she has no pulse or breathing, then she want to be left in peace and have not have CPR done.    Her brother spent months in a coma prior to death and she doesn't want to go through anything like that.  D/w pt.  DNR form done at [place].

## 2020-07-25 ENCOUNTER — Encounter: Payer: Self-pay | Admitting: Physical Therapy

## 2020-07-25 ENCOUNTER — Ambulatory Visit: Payer: Medicare Other | Attending: Rheumatology | Admitting: Physical Therapy

## 2020-07-25 ENCOUNTER — Other Ambulatory Visit: Payer: Self-pay

## 2020-07-25 DIAGNOSIS — M25512 Pain in left shoulder: Secondary | ICD-10-CM | POA: Insufficient documentation

## 2020-07-25 DIAGNOSIS — R262 Difficulty in walking, not elsewhere classified: Secondary | ICD-10-CM | POA: Diagnosis not present

## 2020-07-25 DIAGNOSIS — G8929 Other chronic pain: Secondary | ICD-10-CM | POA: Diagnosis not present

## 2020-07-25 DIAGNOSIS — M797 Fibromyalgia: Secondary | ICD-10-CM | POA: Diagnosis not present

## 2020-07-25 DIAGNOSIS — M25511 Pain in right shoulder: Secondary | ICD-10-CM | POA: Insufficient documentation

## 2020-07-25 DIAGNOSIS — M6281 Muscle weakness (generalized): Secondary | ICD-10-CM | POA: Diagnosis not present

## 2020-07-25 NOTE — Assessment & Plan Note (Signed)
Labs discussed with patient.  We will continue losartan hydrochlorothiazide.

## 2020-07-25 NOTE — Assessment & Plan Note (Signed)
She had seen the pulmonary clinic with compliance with baseline meds.  Her breathing is better in the meantime.  She has some occ cough.  Would continue albuterol and Symbicort

## 2020-07-25 NOTE — Therapy (Signed)
[facility] [address] [place], [address] Phone: [phone]   Fax:  [phone]  Physical Therapy Treatment  Patient Details  Name: Samantha [name] MRN: [id number] Date of Birth: 1945/12/06 Referring Provider (PT): Bo Merino, MD   Encounter Date: 07/25/2020   PT End of Session - 07/25/20 1047    Visit Number 9    Number of Visits 24    Date for PT Re-Evaluation 08/10/20    Authorization Type [employer] MEDICARE reporting period from 05/18/2020    Progress Note Due on Visit 10    PT Start Time 1034    PT Stop Time 1114    PT Time Calculation (min) 40 min    Activity Tolerance Patient tolerated treatment well    Behavior During Therapy [facility] for tasks assessed/performed           Past Medical History:  Diagnosis Date  . ALLERGIC RHINITIS 07/26/2008  . ASTHMA 04/14/2007  . Asthma   . ASYMPTOMATIC POSTMENOPAUSAL STATUS 05/10/2008  . Bronchiectasis ([place])   . CHEST PAIN 08/26/2008  . COLONIC POLYPS, HX OF 04/14/2007   colonic leiomyoma  . Decreased grip strength   . Episcleritis   . FIBROIDS, UTERUS 05/10/2008  . Fibromyalgia   . Gait abnormality   . GERD 04/14/2007  . HYPERCHOLESTEROLEMIA 01/07/2008  . HYPERGLYCEMIA 11/28/2009  . HYPERTENSION 01/28/2008  . INSOMNIA 05/10/2008  . Irritable bowel syndrome 04/06/2010  . OSTEOARTHRITIS 11/28/2009  . 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 SINUSITIS 02/10/2009    Past Surgical History:  Procedure Laterality Date  . COLONOSCOPY W/ POLYPECTOMY    . ELECTROCARDIOGRAM  12/04/2006  . IR RADIOLOGY PERIPHERAL GUIDED IV START  04/21/2018  . IR US GUIDE VASC ACCESS LEFT  04/21/2018  . NASAL SEPTUM SURGERY    . Stress Cardiolite  02/13/2002    There were no vitals filed for this visit.   Subjective Assessment - 07/25/20 1038    Subjective Patient reports she has no pain except some tightnes in her chest. States her breathing is bothering her today but she  has already taken as much inhaler as she is able. States the cold weather exacerbates her breathing problems. Report no changes in her medication or health conditions. She did not come last week because she had doctor appoinments and was not scheduled at PT. She had a mouth surgery last week and was unable to perform physical activity last week due to this. Is now cleared for exercise.    Pertinent History Patient is a 74 y.o. female who presents to outpatient physical therapy with a referral for medical diagnosis primary osteoarthritis of both hands, primary osteoarthritis of both shoulders, fibromyalgia. This patient's chief complaints consist of widespread pain in neck, back, B shoulders, UE including hands, hips, LEs including feet leading to the following functional deficits: difficulty with ADLs, IADLs, carrying groceries, putting gate down on vehicle, sleeping, laying down, getting up from the chair, walking, tolerating activity, holding items, pushing, pulling, laundry, etc. Relevant past medical history and comorbidities include slightly enlarged heart, bronchiectasis, symptomatic asthma and allergies, gradual unintensional weight loss (10 lbs over years? Patient unclear), ovarian cysts and was referred to oncologist and supposed to have a scan (October), increased urgency for bladder recently, back pain with bulging disk, fibromyalgia, R knee meniscus tear with surgery in 2013,  raynaud's, BIS, insomnia, GERD, hx left hip bursitis, gait abnormality, chest pain .  Patient denies hx of cancer, stroke, seizures,  lung problem besides above, major cardiac events (except enlarged heart), diabetes, new onset stumbling or dropping things (does have chronic dropping things).    Limitations Lifting;Standing;Walking;House hold activities;Writing;Other (comment)   difficulty with ADLs, IADLs, carrying groceries, putting gate down on vehicle, sleeping, laying down, getting up from the chair, walking, tolerating  activity, holding items, pushing, pulling, laundry, etc.   Diagnostic tests 12/21/19 shoulder radiograph: "You have similar findings in both shoulders with arthritis at the joint where the collarbone attaches to the shoulder blade."    Patient Stated Goals to have no pain.    Currently in Pain? Yes    Pain Score 1     Pain Location Chest    Pain Descriptors / Indicators Tightness           TREATMENT: DOES have a Latex sensitivity  Therapeutic exercise:to centralize symptoms and improve ROM, strength, muscular endurance, and activity tolerance required for successful completion of functional activities.  -NuStep level0 using bilateral lower and upper extremities. Seatsetting 7, handle setting 8. For improved extremity mobility, muscular endurance, and activity tolerance; and to induce the analgesic effect of aerobic exercise, stimulate improved joint nutrition, and prepare body structures and systems for following interventions. x10Minutes. Average SPM =. 58  Circuit:  - standing rows with blue theraband, 2x15 - standing/seated bicep curls, 2x15 with 4# DB - modified push up against TM bar 2x10  Circuit: - standing/seated B shoulder ER, 2x15 yellow band - seated lat pull withbluetheraband anchored overhead,2x15 - sit <>stand with no UE support from 17 inch chair while holding 4#DB at chest, 2x15  - standing B AROM shoulder Abduction to 90 degrees, thumbs forward. 1x15 - slightly abducted suitcase carry 3x100 feet with 6#DB in each side.    Pt required multimodal cuing for proper technique and to facilitate improved neuromuscular control, strength, range of motion, and functional ability resulting in improved performance and form.  HOME EXERCISE PROGRAM Access Code: [email or web address] URL: [email or web address] Date: 07/05/2020 Prepared by: Rosita Kea  Exercises Sit to Stand without Arm Support - 1 x daily - 3 sets - 10 reps Row with band/cable - 1  x daily - 3 sets - 15 reps - blue band Shoulder Flexion Wall Slide with Towel - 1 x daily - 2 sets - 10 reps Shoulder External Rotation with Resistance - 1 x daily - 3 sets - 10 reps - red or yellow band Side Lying Open Book - 20 reps Supine Lower Trunk Rotation - 20 reps Putty Squeezes - 10 reps Key Pinch with Putty - 10 reps Tip Pinch with Putty - 10 reps Seated Three Finger Pinch with Putty - 10 reps Finger Pinch and Pull with Putty - 10 reps     PT Education - 07/25/20 1047    Education Details Exercise purpose/form. Self management techniques    Person(s) Educated Patient    Methods Explanation;Demonstration;Tactile cues;Verbal cues    Comprehension Verbalized understanding;Returned demonstration;Verbal cues required;Tactile cues required;Need further instruction            PT Short Term Goals - 07/25/20 1106      PT SHORT TERM GOAL #1   Title Be independent with initial home exercise program for self-management of symptoms.    Baseline Initial HEP provided at IE (05/18/2020);    Time 2    Period Weeks    Status Achieved             PT Long Term Goals - 05/18/20 1159  PT LONG TERM GOAL #1   Title Be independent with a long-term home exercise program for self-management of symptoms.    Baseline initial HEP provided at IE (05/18/2020);    Time 12    Period Weeks    Status --   TARGET DATE FOR ALL LONG TERM GOALS: 08/10/2020     PT LONG TERM GOAL #2   Title Demonstrate improved FOTO score to equal or greater than 58 by visit #12 to demonstrate improvement in overall condition and self-reported functional ability.    Baseline 51 (05/18/2020);    Time 12    Period Weeks    Status New      PT LONG TERM GOAL #3   Title Patient will complete 5 Time Sit to Stand from 18 inch surface with no UE support in equal or less than 16 seconds to improve her ability to perform ADLs, transfers, and be able to carry things when standing up from her chair.    Baseline 20 seconds,  BUE use on 18.5 inch plinth (05/18/2020);    Time 12    Period Weeks    Status New      PT LONG TERM GOAL #4   Title Patient will improve R grip strength by 5 lbs to improve her ability to use her R hand for holding and manipulating objects.    Baseline R: (47+44+35)/3 = 42 lbs (05/18/2020);    Time 12    Period Weeks    Status New      PT LONG TERM GOAL #5   Title Patient will report improvement in ability to complete community, work and/or recreational activities with at least 50% less limitation due to current condition.    Baseline Functional Limitations: carrying groceries, putting gate down, difficulty sleeping, laying down, getting up from the chair, completing ADLs and IADLs, pushing, pulling, laundry (05/18/2020);    Time 12    Period Weeks    Status New      Additional Long Term Goals   Additional Long Term Goals Yes      PT LONG TERM GOAL #6   Title Improve B shoulder AROM flexion to equal or greater than 160 degrees without increased pain to improve ability to reach and complete ADLs such as dressing with less difficulty.    Baseline R = 140, L = 150 both painful (05/18/2020);    Time 12    Period Weeks    Status New                 Plan - 07/25/20 1105    Clinical Impression Statement Patient has not been to PT for 2 weeks due to mouth procedure. Patient tolerated treatment with some difficulty due to dry cough and feeling short of breath occasionally. Also had some pain over the left distal upper trap with bicep curls. Tender to this region with palpation. Pain decreased with rest. Did need to reduce volume slightly due to quick fatigue.  Does appear to have more difficulty during cool weather. Patient would benefit from continued management of limiting condition by skilled physical therapist to address remaining impairments and functional limitations to work towards stated goals and return to PLOF or maximal functional independence.    Personal Factors and Comorbidities  Age;Comorbidity 3+;Time since onset of injury/illness/exacerbation;Fitness;Past/Current Experience    Comorbidities Relevant past medical history and comorbidities include slightly enlarged heart, bronchiectasis, symptomatic asthma and allergies, gradual unintensional weight loss (10 lbs over years? Patient unclear), ovarian cysts and  was referred to oncologist and supposed to have a scan (October), increased urgency for bladder recently, back pain with bulging disk, fibromyalgia, R knee meniscus tear with surgery in 2013,  raynaud's, IBS, insomnia, GERD, hx left hip bursitis, gait abnormality, chest pain    Examination-Activity Limitations Bathing;Hygiene/Grooming;Squat;Lift;Stairs;Bed Mobility;Bend;Locomotion Level;Stand;Caring for Others;Reach Overhead;Carry;Transfers;Sit;Dressing;Sleep    Examination-Participation Restrictions Laundry;Cleaning;Community Activity;Interpersonal Relationship;Meal Prep;Shop    Stability/Clinical Decision Making Evolving/Moderate complexity    Rehab Potential Fair    PT Frequency 2x / week    PT Duration 12 weeks    PT Treatment/Interventions ADLs/Self Care Home Management;Aquatic Therapy;Cryotherapy;Moist Heat;Electrical Stimulation;Gait training;Stair training;Functional mobility training;Therapeutic activities;Therapeutic exercise;Balance training;Neuromuscular re-education;Patient/family education;Manual techniques;Dry needling;Passive range of motion;Energy conservation;Spinal Manipulations;Joint Manipulations    PT Next Visit Plan assess response to HEP, graded exercise for improved strength and ROM    PT Home Exercise Plan Medbridge Access Code: [email or web address]    Consulted and Agree with Plan of Care Patient           Patient will benefit from skilled therapeutic intervention in order to improve the following deficits and impairments:  Abnormal gait, Pain, Decreased mobility, Decreased coordination, Decreased activity tolerance, Decreased endurance, Decreased range  of motion, Decreased strength, Hypomobility, Impaired perceived functional ability, Impaired UE functional use, Impaired flexibility, Difficulty walking, Decreased balance  Visit Diagnosis: Fibromyalgia  Muscle weakness (generalized)  Difficulty in walking, not elsewhere classified  Chronic right shoulder pain  Chronic left shoulder pain     Problem List Patient Active Problem List   Diagnosis Date Noted  . DNR (do not resuscitate) 07/24/2020  . Tinea versicolor 05/30/2020  . Shoulder pain 12/24/2019  . Myalgia 12/24/2019  . Bronchiectasis without complication ([place]) 40/98/1191  . Dysphagia 07/23/2019  . Abdominal pain 07/23/2019  . Nonintractable headache 03/19/2019  . Paresthesia 03/19/2019  . Decreased grip strength 02/04/2019  . Itching 02/02/2019  . Local superficial swelling 02/02/2019  . Weight loss 02/02/2019  . Osteopenia 08/09/2018  . Chronic rhinitis 07/23/2018  . 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 Healthcare maintenance 07/16/2018  . Moderate persistent asthma with acute exacerbation 01/30/2018  . Advance care planning 01/02/2018  . Vitamin D deficiency 07/08/2017  . 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/19/2017  . Primary osteoarthritis of both knees 01/19/2017  . Primary osteoarthritis of both feet 01/19/2017  . Cough 11/14/2016  . SOB (shortness of breath) 11/14/2016  . Diverticulosis 03/19/2016  . Lumbar compression fracture ([place]) 06/20/2015  . Fibromyalgia 02/26/2013  . Allergic rhinitis due to pollen 11/20/2010  . 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 IRRITABLE BOWEL SYNDROME 04/06/2010  . THYROID NODULE, RIGHT 12/07/2009  . OSTEOARTHRITIS 11/28/2009  . FIBROIDS, UTERUS 05/10/2008  . Insomnia 05/10/2008  . ASYMPTOMATIC POSTMENOPAUSAL STATUS 05/10/2008  . Essential hypertension 01/28/2008  . HYPERCHOLESTEROLEMIA 01/07/2008  . GLAUCOMA 01/07/2008  . Gastroesophageal reflux disease 04/14/2007  .  Asthma 04/14/2007    Nancy Nordmann 07/25/2020, 11:15 AM  [facility] [address] [place], [address] Phone: [phone]   Fax:  [phone]  Name: Samantha [name] MRN: [id number] Date of Birth: Nov 26, 1945

## 2020-07-25 NOTE — Assessment & Plan Note (Signed)
She had covid vaccine.  Cautions d/w pt.  Church was doing [employer].   Other vaccines up to date.   Colon 2018 Pap not due.  Mammogram 2021 DXA 2019, reasonable to defer 2021 given pandemic, d/w pt.

## 2020-07-25 NOTE — Assessment & Plan Note (Signed)
  H/o fibromyalgia at baseline.  She had rheumatology f/u.  She has been in PT. I will defer to rheumatology.

## 2020-07-26 ENCOUNTER — Encounter: Payer: Medicare Other | Admitting: Physical Therapy

## 2020-07-27 ENCOUNTER — Ambulatory Visit: Payer: Medicare Other | Admitting: Physical Therapy

## 2020-07-27 ENCOUNTER — Encounter: Payer: Self-pay | Admitting: Physical Therapy

## 2020-07-27 ENCOUNTER — Other Ambulatory Visit: Payer: Self-pay

## 2020-07-27 ENCOUNTER — Telehealth: Payer: Self-pay | Admitting: Internal Medicine

## 2020-07-27 DIAGNOSIS — M25512 Pain in left shoulder: Secondary | ICD-10-CM

## 2020-07-27 DIAGNOSIS — M797 Fibromyalgia: Secondary | ICD-10-CM | POA: Diagnosis not present

## 2020-07-27 DIAGNOSIS — G8929 Other chronic pain: Secondary | ICD-10-CM

## 2020-07-27 DIAGNOSIS — R262 Difficulty in walking, not elsewhere classified: Secondary | ICD-10-CM | POA: Diagnosis not present

## 2020-07-27 DIAGNOSIS — M25511 Pain in right shoulder: Secondary | ICD-10-CM | POA: Diagnosis not present

## 2020-07-27 DIAGNOSIS — M6281 Muscle weakness (generalized): Secondary | ICD-10-CM | POA: Diagnosis not present

## 2020-07-27 NOTE — Telephone Encounter (Signed)
Tried to call pt again but still unable to reach. Left message again for her to return call.

## 2020-07-27 NOTE — Telephone Encounter (Signed)
Called and spoke with pt who states she has had consistent chest congestion since last OV with CY 10/14.  Pt said she does have an occ dry cough that will sometimes be productive getting up thick white phlegm. Pt also has some tightness in chest. Denies any complaints of wheezing.  Pt has been using her rescue inhaler about 2-3 times daily. Pt denies any complaints of fever. Temp while on phone with pt was 98.4.  Pt is taking zyrtec as prescribed as well as al her daily meds as prescribed. Pt states she has been using the Qnasal spray and symbicort inhaler and will also use the azelastine prn.  Pt said at [place] she was prescribed zpak which she said did clear symptoms up some but did not clear it up completely.  With the holidays around the corner, pt is wondering if something other than a zpak can be prescribed to help with her symptoms.  Dr. Annamaria Boots, please advise.  Allergies  Allergen Reactions  . Cefuroxime Axetil     REACTION: pt not sure of reaction- she can tolerate amoxil  . Doxycycline Itching  . Latex Itching  . Lisinopril     Cough  . Lovastatin Other (See Comments)    Muscle aches   . Sulfonamide Derivatives     REACTION: pt not sure of reaction     Current Outpatient Medications:  .  acetaminophen (TYLENOL) 500 MG tablet, Take 1,000 mg by mouth every 6 (six) hours as needed for mild pain or headache., Disp: , Rfl:  .  albuterol (VENTOLIN HFA) 108 (90 Base) MCG/ACT inhaler, Inhale 2 puffs into the lungs every 4 (four) hours as needed for wheezing or shortness of breath., Disp: 18 g, Rfl: 3 .  Azelastine HCl 0.15 % SOLN, Place 2 sprays into both nostrils 2 (two) times daily as needed (runny nose)., Disp: 90 mL, Rfl: 4 .  Beclomethasone Dipropionate (QNASL) 80 MCG/ACT AERS, Place 2 sprays into both nostrils daily. Two sprays into both nostrils daily, Disp: 10.6 g, Rfl: 3 .  benzonatate (TESSALON) 200 MG capsule, Take 1 capsule (200 mg total) by mouth 3 (three) times daily as  needed for cough., Disp: 30 capsule, Rfl: 1 .  bifidobacterium infantis (ALIGN) capsule, Take 1 capsule by mouth daily.  , Disp: , Rfl:  .  budesonide-formoterol (SYMBICORT) 160-4.5 MCG/ACT inhaler, Inhale 2 puffs into the lungs 2 (two) times daily., Disp: 3 Inhaler, Rfl: 3 .  cetirizine (ZYRTEC) 10 MG tablet, Take 10 mg by mouth at bedtime. , Disp: , Rfl:  .  Cholecalciferol (VITAMIN D) 50 MCG (2000 UT) CAPS, Take 1 capsule by mouth daily., Disp: , Rfl:  .  losartan-hydrochlorothiazide (HYZAAR) 100-12.5 MG tablet, Take 1 tablet by mouth daily., Disp: 90 tablet, Rfl: 3 .  Magnesium Malate POWD, (1250mg  tabs) 1 tablet by mouth qhs, Disp: , Rfl:  .  Manganese 50 MG TABS, Take 50 mg by mouth at bedtime. , Disp: , Rfl:  .  Misc Natural Products (TART CHERRY ADVANCED PO), Take 2 tablets by mouth daily. , Disp: , Rfl:  .  montelukast (SINGULAIR) 10 MG tablet, Take 1 tablet (10 mg total) by mouth at bedtime., Disp: 90 tablet, Rfl: 3 .  Olopatadine HCl (PATADAY) 0.2 % SOLN, Place 1 drop into both eyes daily as needed (itchy eyes)., Disp: , Rfl:  .  pantoprazole (PROTONIX) 40 MG tablet, Take 1 tablet (40 mg total) by mouth 2 (two) times daily., Disp: 180 tablet, Rfl: 3 .  Polyethyl Glycol-Propyl Glycol (SYSTANE) 0.4-0.3 % GEL ophthalmic gel, Place 1 application into both eyes every 6 (six) hours as needed (dry eyes)., Disp: , Rfl:  .  thiamine (VITAMIN B-1) 50 MG tablet, Take 100 mg by mouth daily., Disp: , Rfl:  .  TURMERIC PO, Take 1 tablet by mouth daily. , Disp: , Rfl:

## 2020-07-27 NOTE — Therapy (Signed)
[facility] [address] [place], [address] Phone: [phone]   Fax:  [phone]  Physical Therapy Treatment / Discharge Summary Reporting period: 05/18/2020 - 07/27/2020  Patient Details  Name: Samantha [name] MRN: [id number] Date of Birth: 02-25-1946 Referring Provider (PT): Bo Merino, MD   Encounter Date: 07/27/2020   PT End of Session - 07/27/20 0956    Visit Number 10    Number of Visits 24    Date for PT Re-Evaluation 08/10/20    Authorization Type [employer] MEDICARE reporting period from 05/18/2020    Progress Note Due on Visit 10    PT Start Time 0942    PT Stop Time 1025    PT Time Calculation (min) 43 min    Activity Tolerance Patient tolerated treatment well    Behavior During Therapy [facility] for tasks assessed/performed           Past Medical History:  Diagnosis Date  . ALLERGIC RHINITIS 07/26/2008  . ASTHMA 04/14/2007  . Asthma   . ASYMPTOMATIC POSTMENOPAUSAL STATUS 05/10/2008  . Bronchiectasis ([place])   . CHEST PAIN 08/26/2008  . COLONIC POLYPS, HX OF 04/14/2007   colonic leiomyoma  . Decreased grip strength   . Episcleritis   . FIBROIDS, UTERUS 05/10/2008  . Fibromyalgia   . Gait abnormality   . GERD 04/14/2007  . HYPERCHOLESTEROLEMIA 01/07/2008  . HYPERGLYCEMIA 11/28/2009  . HYPERTENSION 01/28/2008  . INSOMNIA 05/10/2008  . Irritable bowel syndrome 04/06/2010  . OSTEOARTHRITIS 11/28/2009  . 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 SINUSITIS 02/10/2009    Past Surgical History:  Procedure Laterality Date  . COLONOSCOPY W/ POLYPECTOMY    . ELECTROCARDIOGRAM  12/04/2006  . IR RADIOLOGY PERIPHERAL GUIDED IV START  04/21/2018  . IR US GUIDE VASC ACCESS LEFT  04/21/2018  . NASAL SEPTUM SURGERY    . Stress Cardiolite  02/13/2002    There were no vitals filed for this visit.   Subjective Assessment - 07/27/20 0945    Subjective Pateint reports she is feeling better today than last  session. Report she feels physical therapy is helping her and she does not have the pain she was previously having in her arms.  Today she just has a bit of tightness in her chest. Reports she was tired following last treatment session and rested most of the day but did not have further difficulty breathing. Patient reports she is feeling better throughout and is doing well with her HEP.    Pertinent History Patient is a 74 y.o. female who presents to outpatient physical therapy with a referral for medical diagnosis primary osteoarthritis of both hands, primary osteoarthritis of both shoulders, fibromyalgia. This patient's chief complaints consist of widespread pain in neck, back, B shoulders, UE including hands, hips, LEs including feet leading to the following functional deficits: difficulty with ADLs, IADLs, carrying groceries, putting gate down on vehicle, sleeping, laying down, getting up from the chair, walking, tolerating activity, holding items, pushing, pulling, laundry, etc. Relevant past medical history and comorbidities include slightly enlarged heart, bronchiectasis, symptomatic asthma and allergies, gradual unintensional weight loss (10 lbs over years? Patient unclear), ovarian cysts and was referred to oncologist and supposed to have a scan (October), increased urgency for bladder recently, back pain with bulging disk, fibromyalgia, R knee meniscus tear with surgery in 2013,  raynaud's, BIS, insomnia, GERD, hx left hip bursitis, gait abnormality, chest pain .  Patient denies hx of cancer, stroke, seizures, lung problem  besides above, major cardiac events (except enlarged heart), diabetes, new onset stumbling or dropping things (does have chronic dropping things).    Limitations Lifting;Standing;Walking;House hold activities;Writing;Other (comment)   difficulty with ADLs, IADLs, carrying groceries, putting gate down on vehicle, sleeping, laying down, getting up from the chair, walking, tolerating  activity, holding items, pushing, pulling, laundry, etc.   Diagnostic tests 12/21/19 shoulder radiograph: "You have similar findings in both shoulders with arthritis at the joint where the collarbone attaches to the shoulder blade."    Patient Stated Goals to have no pain.    Currently in Pain? No/denies    Effect of Pain on Daily Activities pulling down gate on vehicle           OBJECTIVE: FOTO = 65 (07/27/2020)  FUNCTIONAL/BALANCE TEST - 5 Time Sit to Stand: 15.88 seconds from 18 inch chair with no UE support.   ACTIVE RANGE OF MOTION Shoulder Flexion: R = 148, L  = 150  GRIP STRENGTH Grip Strength in lbs, average of three measures:  R: (36+39+36)/3 = 37 painful L: (50+49+47)/3 = 49  TREATMENT: DOES have a Latex sensitivity  Therapeutic exercise:to centralize symptoms and improve ROM, strength, muscular endurance, and activity tolerance required for successful completion of functional activities.  -NuStep level0 using bilateral lower and upper extremities. Seatsetting 7, handle setting 8. For improved extremity mobility, muscular endurance, and activity tolerance; and to induce the analgesic effect of aerobic exercise, stimulate improved joint nutrition, and prepare body structures and systems for following interventions. x10Minutes. Average SPM =60 - measurement to assess progress (see above) - review of HEP with updates as needed - review of putty exercises and provision of more putty and green theraband.  - standing/seated bicep curls, 1x15 with3# DB (5# too heavy) - slightly abducted suitcase carry 3x100 feet with 5#DB in each side.   Pt required multimodal cuing for proper technique and to facilitate improved neuromuscular control, strength, range of motion, and functional ability resulting in improved performance and form.  HOME EXERCISE PROGRAM Access Code: [email or web address] URL: [email or web address] Date: 07/27/2020 Prepared by: Rosita Kea  Exercises Side Lying Open Book - 20 reps Supine Lower Trunk Rotation - 20 reps Putty Squeezes - 10 reps Key Pinch with Putty - 10 reps Tip Pinch with Putty - 10 reps Seated Three Finger Pinch with Putty - 10 reps Finger Pinch and Pull with Putty - 10 reps Shoulder Flexion Wall Slide with Towel - 3 x weekly - 2 sets - 10 reps Row with band/cable - 3 x weekly - 3 sets - 15 reps - blue band Push Up on Table - 3 x weekly - 2 sets - 10 reps Shoulder External Rotation with Resistance - 3 x weekly - 3 sets - 10 reps - red or yellow band Sit to Stand without Arm Support - 3 x weekly - 3 sets - 10 reps Seated Bicep Curls Supinated with Dumbbells - 3 x weekly - 2 sets - 15 reps Standing Shoulder Scaption - 3 x weekly - 2 sets - 10 reps Kettlebell Suitcase Carry - 3 x weekly - 3 sets - 100 feet     PT Education - 07/27/20 0956    Education Details Exercise purpose/form. Self management techniques    Person(s) Educated Patient    Methods Explanation;Demonstration;Tactile cues;Verbal cues    Comprehension Verbalized understanding;Returned demonstration;Verbal cues required;Tactile cues required;Need further instruction            PT Short Term Goals - 07/25/20 1106  PT SHORT TERM GOAL #1   Title Be independent with initial home exercise program for self-management of symptoms.    Baseline Initial HEP provided at IE (05/18/2020);    Time 2    Period Weeks    Status Achieved             PT Long Term Goals - 05/18/20 1159      PT LONG TERM GOAL #1   Title Be independent with a long-term home exercise program for self-management of symptoms.    Baseline initial HEP provided at IE (05/18/2020);    Time 12    Period Weeks    Status --   TARGET DATE FOR ALL LONG TERM GOALS: 08/10/2020     PT LONG TERM GOAL #2   Title Demonstrate improved FOTO score to equal or greater than 58 by visit #12 to demonstrate improvement in overall condition and self-reported functional ability.     Baseline 51 (05/18/2020);    Time 12    Period Weeks    Status New      PT LONG TERM GOAL #3   Title Patient will complete 5 Time Sit to Stand from 18 inch surface with no UE support in equal or less than 16 seconds to improve her ability to perform ADLs, transfers, and be able to carry things when standing up from her chair.    Baseline 20 seconds, BUE use on 18.5 inch plinth (05/18/2020);    Time 12    Period Weeks    Status New      PT LONG TERM GOAL #4   Title Patient will improve R grip strength by 5 lbs to improve her ability to use her R hand for holding and manipulating objects.    Baseline R: (47+44+35)/3 = 42 lbs (05/18/2020);    Time 12    Period Weeks    Status New      PT LONG TERM GOAL #5   Title Patient will report improvement in ability to complete community, work and/or recreational activities with at least 50% less limitation due to current condition.    Baseline Functional Limitations: carrying groceries, putting gate down, difficulty sleeping, laying down, getting up from the chair, completing ADLs and IADLs, pushing, pulling, laundry (05/18/2020);    Time 12    Period Weeks    Status New      Additional Long Term Goals   Additional Long Term Goals Yes      PT LONG TERM GOAL #6   Title Improve B shoulder AROM flexion to equal or greater than 160 degrees without increased pain to improve ability to reach and complete ADLs such as dressing with less difficulty.    Baseline R = 140, L = 150 both painful (05/18/2020);    Time 12    Period Weeks    Status New                 Plan - 07/27/20 1056    Clinical Impression Statement Patient has attended 10 physical therapy sessions and has demonstrated a good improvement in condition where she is able to do most of her activities without pain and has improved her FOTO score past goals. Does still have some limitation in overhead reaching but with much less pain. Patient appears to have improved to be ready for  discharge to long term HEP which was updated and for long term use at this session. Patient is now discharged from PT due to improvement in  condition.    Personal Factors and Comorbidities Age;Comorbidity 3+;Time since onset of injury/illness/exacerbation;Fitness;Past/Current Experience    Comorbidities Relevant past medical history and comorbidities include slightly enlarged heart, bronchiectasis, symptomatic asthma and allergies, gradual unintensional weight loss (10 lbs over years? Patient unclear), ovarian cysts and was referred to oncologist and supposed to have a scan (October), increased urgency for bladder recently, back pain with bulging disk, fibromyalgia, R knee meniscus tear with surgery in 2013,  raynaud's, IBS, insomnia, GERD, hx left hip bursitis, gait abnormality, chest pain    Examination-Activity Limitations Bathing;Hygiene/Grooming;Squat;Lift;Stairs;Bed Mobility;Bend;Locomotion Level;Stand;Caring for Others;Reach Overhead;Carry;Transfers;Sit;Dressing;Sleep    Examination-Participation Restrictions Laundry;Cleaning;Community Activity;Interpersonal Relationship;Meal Prep;Shop    Stability/Clinical Decision Making Evolving/Moderate complexity    Rehab Potential Fair    PT Frequency 2x / week    PT Duration 12 weeks    PT Treatment/Interventions ADLs/Self Care Home Management;Aquatic Therapy;Cryotherapy;Moist Heat;Electrical Stimulation;Gait training;Stair training;Functional mobility training;Therapeutic activities;Therapeutic exercise;Balance training;Neuromuscular re-education;Patient/family education;Manual techniques;Dry needling;Passive range of motion;Energy conservation;Spinal Manipulations;Joint Manipulations    PT Next Visit Plan Patient is now discharged from PT to independent management with long term HEP due to improvement in condition    PT Home Exercise Plan Medbridge Access Code: [email or web address]    Consulted and Agree with Plan of Care Patient           Patient will benefit  from skilled therapeutic intervention in order to improve the following deficits and impairments:  Abnormal gait, Pain, Decreased mobility, Decreased coordination, Decreased activity tolerance, Decreased endurance, Decreased range of motion, Decreased strength, Hypomobility, Impaired perceived functional ability, Impaired UE functional use, Impaired flexibility, Difficulty walking, Decreased balance  Visit Diagnosis: Fibromyalgia  Muscle weakness (generalized)  Difficulty in walking, not elsewhere classified  Chronic right shoulder pain  Chronic left shoulder pain     Problem List Patient Active Problem List   Diagnosis Date Noted  . DNR (do not resuscitate) 07/24/2020  . Tinea versicolor 05/30/2020  . Shoulder pain 12/24/2019  . Myalgia 12/24/2019  . Bronchiectasis without complication ([place]) 46/56/8127  . Dysphagia 07/23/2019  . Abdominal pain 07/23/2019  . Nonintractable headache 03/19/2019  . Paresthesia 03/19/2019  . Decreased grip strength 02/04/2019  . Itching 02/02/2019  . Local superficial swelling 02/02/2019  . Weight loss 02/02/2019  . Osteopenia 08/09/2018  . Chronic rhinitis 07/23/2018  . 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 Healthcare maintenance 07/16/2018  . Moderate persistent asthma with acute exacerbation 01/30/2018  . Advance care planning 01/02/2018  . Vitamin D deficiency 07/08/2017  . 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/19/2017  . Primary osteoarthritis of both knees 01/19/2017  . Primary osteoarthritis of both feet 01/19/2017  . Cough 11/14/2016  . SOB (shortness of breath) 11/14/2016  . Diverticulosis 03/19/2016  . Lumbar compression fracture ([place]) 06/20/2015  . Fibromyalgia 02/26/2013  . Allergic rhinitis due to pollen 11/20/2010  . 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 IRRITABLE BOWEL SYNDROME 04/06/2010  . THYROID NODULE, RIGHT 12/07/2009  . OSTEOARTHRITIS 11/28/2009  . FIBROIDS, UTERUS  05/10/2008  . Insomnia 05/10/2008  . ASYMPTOMATIC POSTMENOPAUSAL STATUS 05/10/2008  . Essential hypertension 01/28/2008  . HYPERCHOLESTEROLEMIA 01/07/2008  . GLAUCOMA 01/07/2008  . Gastroesophageal reflux disease 04/14/2007  . Asthma 04/14/2007    Everlean Alstrom. Graylon Good, PT, DPT 07/27/20, 10:57 AM  [facility] [address], [place], [address] Phone: [phone]   Fax:  [phone]  Name: Samantha [name] MRN: [id number] Date of Birth: 26-Jun-1946

## 2020-07-27 NOTE — Telephone Encounter (Signed)
Attempted to call pt on both numbers listed but unable to reach. Left message for her to return call.

## 2020-07-27 NOTE — Telephone Encounter (Signed)
Offer augmentin 875 mg, # 14, 1 twice daily  She can also take otc Mucinex to help clear mucus.

## 2020-07-28 MED ORDER — AMOXICILLIN-POT CLAVULANATE 875-125 MG PO TABS: 1.0000 | ORAL_TABLET | Freq: Two times a day (BID) | ORAL | 0 refills | Status: DC

## 2020-07-28 NOTE — Telephone Encounter (Signed)
Called and spoke to pt. Informed her of the recs per CY. Rx sent to preferred pharmacy. Pt verbalized understanding and denied any further questions or concerns at this time.   

## 2020-07-28 NOTE — Telephone Encounter (Signed)
Patient is returning phone call. Patient phone number is [phone] h or [phone] c.

## 2020-08-02 ENCOUNTER — Ambulatory Visit: Payer: Medicare Other | Admitting: Physical Therapy

## 2020-08-09 ENCOUNTER — Encounter: Payer: Medicare Other | Admitting: Physical Therapy

## 2020-08-10 ENCOUNTER — Encounter: Payer: Medicare Other | Admitting: Physical Therapy

## 2020-08-11 ENCOUNTER — Encounter: Payer: Medicare Other | Admitting: Physical Therapy

## 2020-08-15 ENCOUNTER — Encounter: Payer: Medicare Other | Admitting: Physical Therapy

## 2020-08-18 ENCOUNTER — Telehealth: Payer: Self-pay | Admitting: Family Medicine

## 2020-08-18 ENCOUNTER — Encounter: Payer: Medicare Other | Admitting: Physical Therapy

## 2020-08-18 NOTE — Telephone Encounter (Signed)
Agree with quarantine and testing in a few days.  If any sx, then update [place].  Thanks.

## 2020-08-18 NOTE — Telephone Encounter (Signed)
I spoke with pt and pt notified as instructed and voiced understanding. Pt said she was going to do an at home test and would let our office know if positive.

## 2020-08-18 NOTE — Telephone Encounter (Signed)
Patient called in inquiring if it is safe to receive covid booster now that off antibiotic. State she stopped taking it on 08/05/20. Please advise.

## 2020-08-18 NOTE — Telephone Encounter (Signed)
Pt called in wanted to let Dr.Duncan know that her brother melvin tested positive for covid and she is going to quarantine for 10days and she is going to test herself in 5

## 2020-08-18 NOTE — Telephone Encounter (Signed)
I called pt and line was busy and will try again later.

## 2020-08-22 ENCOUNTER — Encounter: Payer: Medicare Other | Admitting: Physical Therapy

## 2020-08-22 ENCOUNTER — Other Ambulatory Visit: Payer: Self-pay

## 2020-08-22 ENCOUNTER — Telehealth: Payer: Self-pay | Admitting: Family Medicine

## 2020-08-22 ENCOUNTER — Other Ambulatory Visit: Payer: Medicare Other

## 2020-08-22 DIAGNOSIS — Z20822 Contact with and (suspected) exposure to covid-19: Secondary | ICD-10-CM

## 2020-08-22 NOTE — Telephone Encounter (Signed)
Pt called in wanted to report that [facility] went with [facility] and she has pneumonia right lower lung and they prescribed amoxicillin and  azithromycine and they did a ekg and chest x-ray and too see Dr. Damita Dunnings at the end of her medication and if need be to repeat chest x-ray in 2 weeks

## 2020-08-22 NOTE — Telephone Encounter (Signed)
Spoke with pt and she stated that she still feels bad but no worse than before. She is resting and taking it easy.  Thank you

## 2020-08-22 NOTE — Telephone Encounter (Signed)
I saw the UC notes and the xray report.  Please get update on patient tomorrow.  I'll await her covid test, currently pending.  Glad she went in for eval today.

## 2020-08-22 NOTE — Telephone Encounter (Signed)
Please triage patient and see about her covid results.  She would qualify for infusion if positive.  Thanks.

## 2020-08-22 NOTE — Telephone Encounter (Signed)
I spoke with pt; pt is very congested in chest and head. Pt is SOB while on phone. Pt has been using inhaler and mucinex. Pt has CP when coughs in middle of chest. Pt has hx of bronchitis & pneumonia; pt needs face to face due to pts breathing and to see if needs CXR. This time congestion and laryngitis started on 08/19/20. No fever. [facility] to [facility] in [place]. Pt will cb with update on how pt is doing and PCR covid results when available.

## 2020-08-22 NOTE — Telephone Encounter (Signed)
Patient is feeling really bad. Coughing, laryngitis, shortness of breath. Patient had Covid test this morning awaiting results. She was around her brother [name] who was covid positive. Please call the patient back. EM

## 2020-08-23 NOTE — Telephone Encounter (Addendum)
Yes, would take both.  That is standard in this situation.  Please update [place] as needed.  Thanks.

## 2020-08-23 NOTE — Telephone Encounter (Signed)
Called and spoke with patient regarding her current status. Patient stated that she is feeling about the same. Patient stated that she has a mild cough, decreased appetite, slight headache and no fever. Patient stated that she took a home covid test, which was negative. Patient wanted to know if she should be on two antibiotics, which are Amoxicillin and azithromycin? Patient just wanted to make sure that was not too many antibiotics.

## 2020-08-23 NOTE — Telephone Encounter (Signed)
Please check with patient later today.  Her covid test is still pending at this point.  Thanks.

## 2020-08-24 LAB — NOVEL CORONAVIRUS, NAA: SARS-CoV-2, NAA: NOT DETECTED

## 2020-08-24 LAB — SARS-COV-2, NAA 2 DAY TAT

## 2020-08-24 NOTE — Telephone Encounter (Signed)
Message regarding antibiotics from Dr. Damita Dunnings sent to pt thru [facility]

## 2020-08-25 ENCOUNTER — Encounter: Payer: Medicare Other | Admitting: Physical Therapy

## 2020-08-29 ENCOUNTER — Ambulatory Visit: Payer: Medicare Other | Admitting: Internal Medicine

## 2020-08-29 ENCOUNTER — Encounter: Payer: Medicare Other | Admitting: Physical Therapy

## 2020-08-29 NOTE — Progress Notes (Signed)
Patient ID: Samantha [name], female    DOB: 28-Nov-1945, 74 y.o.   MRN: [id number]  HPI female never smoker followed for allergic rhinitis, asthma, bronchiectasis,  complicated by GERD/ LPR, Raynaud's, HBP, IBS, Glaucoma , Aortic Atherosclerosis,  Barium swallow 01/09/2016-normal PFT 06/05/2019- Mild restriction, normal flows and Diffusion --------------------------------------------------------------------------------------   06/23/20-  74 year old female never smoker followed for allergic rhinitis, asthma, bronchiectasis, complicated by GERD/ LPR, Raynaud's, HBP, IBS, Glaucoma Albuterol hfa, azelastine nasal, Qnasl, Symbicort 160, singulair, Zyrtec,  Protonix, Carafate,  Covid vax-2 Phizer Flu vax- Had -----c/o headache , cough (mostly non prod), increased sob for past 2 weeks - Had negative covid test 06/16/20 Increased cough, head congestion, SOB over past 2 weeks. No fever or purulent.  She got flu vax, then 2 days later had a rash, which cleared, then the cough and congestion. She asked about a depo shot. 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  Going to Physical Therapy for back and shoulders.   08/30/20- 74 year old female never smoker followed for allergic rhinitis, asthma, bronchiectasis, complicated by GERD/ LPR, Raynaud's, HBP, IBS, Glaucoma Albuterol hfa, azelastine nasal, Qnasl, Symbicort 160, singulair, Zyrtec, Protonix, Carafate,  Covid vax-2 Phizer Flu vax- had Treated augmentin 11/17.  Dx'd RLL pneumonia at urgent care in [facility] 12/13.Rx'd amox 500 TID x 10 days, Zpak 250 mg. Says she has had 2 other episodes of bronchitis since Feb, Rx'd by her PCP. Now out of Symbicort- using rescue HFA 2x/ day. Has laryngitis. Brother [name] had Covid 12/5 for second time after 2 vax. She sat with him at church on 12/5, hen 5 days later began developing chest congestion, cough, laryngitis. Home tested Neg for Covid, then antibody tested neg at [facility] in [place] to Urgent Care 12/13 as above. Had EKG for CP, Neg. CXR done and told RLL pneumonia.  Then had some L lat chest pains, then bilat flank pain w/ o dysuria.  Has finished Zpak and almost finished amoxacillin. Mostly dry cough.  Shows me tender pretibial area R lower leg, tender area about 2 inches down from mid R clavicle, and a "spot" on R antecubital area- none evident on exam.  CXR 06/25/20-  IMPRESSION: Diffuse bilateral interstitial pulmonary opacity, consistent with edema or infection. No focal airspace opacity.  CXR 08/30/20- image reivewed FINDINGS: Persistent interstitial prominence. No new consolidation. No pleural effusion. Stable cardiomediastinal contours with normal heart size. No acute osseous abnormality. IMPRESSION: Persistent nonspecific interstitial prominence.  No new findings.  Review of Systems-see HPI   + = positive Constitutional:   No weight loss, night sweats,  Fevers, chills, fatigue, lassitude. HEENT:   No headaches,  Difficulty swallowing,  Tooth/dental problems,  Sore throat,                No sneezing, itching, or ear ache,   nasal congestion, post nasal drip, CV:  + chest pain, orthopnea, PND, swelling in lower extremities, anasarca, dizziness, palpitations GI  No heartburn, indigestion, abdominal pain, nausea, vomiting,  Resp: No shortness of breath with exertion or at rest.  No excess mucus, productive cough,                                  +-non-productive cough,  No coughing up of blood.  No change in color of mucus.  +infrequent wheezing.   Skin: Clear GU:  MS:  No joint pain or swelling.   Psych:  No change in mood or affect. No depression or anxiety.  No memory loss.   Objective:   Physical Exam General- Alert, Oriented, Affect-appropriate, Distress- none acute; pleasant  Skin- clear Lymphadenopathy- none Head- atraumatic            Eyes- Gross vision intact, PERRLA, conjunctivae clear secretions,            Ears-  Normal hearing            Nose- clear, no-Septal dev, mucus, polyps, erosion, perforation .                        Throat- Malampatti III.   TMs not  retracted , mucosa not red , drainage- none,                     tonsils- atrophic;  + hoarse Neck- flexible , trachea midline, no stridor , thyroid nl, carotid no bruit Chest - symmetrical excursion , unlabored           Heart/CV- RRR , no murmur , no gallop  , no rub, nl s1 s2                           - JVD- none , edema+sockline R, stasis changes- none, varices- none           Lung-  wheeze or rhonchi,-none, cough+ mild dry , dullness-none, rub- none           Chest wall-  Abd-  Br/ Gen/ Rectal- Not done, not indicated Extrem- cyanosis- none, clubbing, none, atrophy- none, strength- nl.  Neuro- grossly intact to observation

## 2020-08-30 ENCOUNTER — Ambulatory Visit (INDEPENDENT_AMBULATORY_CARE_PROVIDER_SITE_OTHER): Payer: Medicare Other | Admitting: Internal Medicine

## 2020-08-30 ENCOUNTER — Other Ambulatory Visit: Payer: Self-pay

## 2020-08-30 ENCOUNTER — Ambulatory Visit (INDEPENDENT_AMBULATORY_CARE_PROVIDER_SITE_OTHER): Payer: Medicare Other

## 2020-08-30 ENCOUNTER — Other Ambulatory Visit: Payer: Self-pay | Admitting: Internal Medicine

## 2020-08-30 ENCOUNTER — Encounter: Payer: Self-pay | Admitting: Internal Medicine

## 2020-08-30 VITALS — BP 118/60 | HR 69 | Temp 97.3°F | Ht 62.0 in | Wt 136.0 lb

## 2020-08-30 DIAGNOSIS — J90 Pleural effusion, not elsewhere classified: Secondary | ICD-10-CM | POA: Diagnosis not present

## 2020-08-30 DIAGNOSIS — J4541 Moderate persistent asthma with (acute) exacerbation: Secondary | ICD-10-CM | POA: Diagnosis not present

## 2020-08-30 DIAGNOSIS — J040 Acute laryngitis: Secondary | ICD-10-CM | POA: Diagnosis not present

## 2020-08-30 DIAGNOSIS — J189 Pneumonia, unspecified organism: Secondary | ICD-10-CM

## 2020-08-30 LAB — COMPREHENSIVE METABOLIC PANEL
ALT: 8 U/L (ref 0–35)
AST: 16 U/L (ref 0–37)
Albumin: 4.4 g/dL (ref 3.5–5.2)
Alkaline Phosphatase: 54 U/L (ref 39–117)
BUN: 15 mg/dL (ref 6–23)
CO2: 30 mEq/L (ref 19–32)
Calcium: 10.2 mg/dL (ref 8.4–10.5)
Chloride: 103 mEq/L (ref 96–112)
Creatinine, Ser: 0.89 mg/dL (ref 0.40–1.20)
GFR: 63.58 mL/min (ref >60.00)
Glucose, Bld: 94 mg/dL (ref 70–99)
Potassium: 3.8 mEq/L (ref 3.5–5.1)
Sodium: 137 mEq/L (ref 135–145)
Total Bilirubin: 0.4 mg/dL (ref 0.2–1.2)
Total Protein: 7.3 g/dL (ref 6.0–8.3)

## 2020-08-30 LAB — CBC
HCT: 37.1 % (ref 36.0–46.0)
Hemoglobin: 12.8 g/dL (ref 12.0–15.0)
MCHC: 34.6 g/dL (ref 30.0–36.0)
MCV: 85.1 fl (ref 78.0–100.0)
Platelets: 216 10*3/uL (ref 150.0–400.0)
RBC: 4.36 Mil/uL (ref 3.87–5.11)
RDW: 14.1 % (ref 11.5–15.5)
WBC: 6.1 10*3/uL (ref 4.0–10.5)

## 2020-08-30 MED ORDER — BUDESONIDE-FORMOTEROL FUMARATE 160-4.5 MCG/ACT IN AERO: 2.0000 | INHALATION_SPRAY | Freq: Two times a day (BID) | RESPIRATORY_TRACT | 12 refills | Status: DC

## 2020-08-30 MED ORDER — BUDESONIDE-FORMOTEROL FUMARATE 160-4.5 MCG/ACT IN AERO: 2.0000 | INHALATION_SPRAY | Freq: Two times a day (BID) | RESPIRATORY_TRACT | 4 refills | Status: DC

## 2020-08-30 NOTE — Assessment & Plan Note (Signed)
She associates with every respiratory exacerbation. Plan- sips, lozenges, voice rest, watch for thrush, sinus drainage and reflux.

## 2020-08-30 NOTE — Assessment & Plan Note (Addendum)
Dry cough. Has tested neg twice for Covid, but exposed. Apparently no fever or purulent sputum. Told CXR showed R lower lobe pneumonia recently. That image not available- will recheck here. Some of complaints may reflect ongoing infection, esp viral. Doubt PE, Cardiac event. Plan- CXR, Chemistry, CBC w diff. Wait on further abx to see results.  Meanwhile we will refill Symbicort, encourage supportive care- rest, fluids, report changes of concern.

## 2020-08-30 NOTE — Patient Instructions (Signed)
Order- CXR   Dx RLL pneumonia  Order- lab- CBC w diff, CMET    Dx RLL pneumonia  Please call if we can help

## 2020-09-01 ENCOUNTER — Encounter: Payer: Medicare Other | Admitting: Physical Therapy

## 2020-09-01 ENCOUNTER — Telehealth: Payer: Medicare Other | Admitting: Family Medicine

## 2020-09-06 ENCOUNTER — Encounter: Payer: Medicare Other | Admitting: Physical Therapy

## 2020-09-08 ENCOUNTER — Encounter: Payer: Medicare Other | Admitting: Physical Therapy

## 2020-09-15 DIAGNOSIS — L218 Other seborrheic dermatitis: Secondary | ICD-10-CM | POA: Diagnosis not present

## 2020-09-15 DIAGNOSIS — L819 Disorder of pigmentation, unspecified: Secondary | ICD-10-CM | POA: Diagnosis not present

## 2020-09-15 DIAGNOSIS — L816 Other disorders of diminished melanin formation: Secondary | ICD-10-CM | POA: Diagnosis not present

## 2020-09-15 DIAGNOSIS — R202 Paresthesia of skin: Secondary | ICD-10-CM | POA: Diagnosis not present

## 2020-09-15 DIAGNOSIS — L300 Nummular dermatitis: Secondary | ICD-10-CM | POA: Diagnosis not present

## 2020-09-29 DIAGNOSIS — H40003 Preglaucoma, unspecified, bilateral: Secondary | ICD-10-CM | POA: Diagnosis not present

## 2020-10-04 ENCOUNTER — Telehealth: Payer: Self-pay | Admitting: Family Medicine

## 2020-10-04 NOTE — Telephone Encounter (Signed)
Pt called in wanted to know if she can get her booster. And she had the 2nd one and she was sick from it.    Please advise

## 2020-10-05 NOTE — Telephone Encounter (Signed)
Patient had chest tightness, headaches, and sinus issues. Patient states it happened the next day after she got the shot and only lasted one day.

## 2020-10-05 NOTE — Telephone Encounter (Signed)
I would like input from Dr. Annamaria Boots.  In the absence of rash or throat swelling/lip swelling, I would think it makes sense to go ahead with the booster but I would like his opinion also.

## 2020-10-05 NOTE — Telephone Encounter (Signed)
Please see what symptoms she had with the second shot let me know.  Thanks.

## 2020-10-06 NOTE — Telephone Encounter (Signed)
I would agree. Suggest go ahead and get booster. Potential illness without booster is greater risk. It would not hurt to take a benadryl and tylenol an hour before getting the booster.

## 2020-10-07 NOTE — Telephone Encounter (Signed)
Spoke with patient about getting the booster and to try to take tylenol and benadryl before getting vaccine. Patient agrees and will make an appt to get booster done.

## 2020-10-07 NOTE — Telephone Encounter (Signed)
See below.  Would be okay to take 1 dose of Tylenol and Benadryl prior to booster vaccine.  Thanks.

## 2020-10-18 ENCOUNTER — Ambulatory Visit: Payer: Medicare Other

## 2020-11-07 ENCOUNTER — Encounter: Payer: Self-pay | Admitting: Internal Medicine

## 2020-11-07 ENCOUNTER — Telehealth: Payer: Self-pay | Admitting: Internal Medicine

## 2020-11-07 ENCOUNTER — Ambulatory Visit (INDEPENDENT_AMBULATORY_CARE_PROVIDER_SITE_OTHER): Payer: Medicare Other | Admitting: Internal Medicine

## 2020-11-07 ENCOUNTER — Other Ambulatory Visit: Payer: Self-pay

## 2020-11-07 VITALS — BP 146/60 | HR 67 | Temp 97.4°F | Ht 62.0 in | Wt 135.0 lb

## 2020-11-07 DIAGNOSIS — J45901 Unspecified asthma with (acute) exacerbation: Secondary | ICD-10-CM | POA: Diagnosis not present

## 2020-11-07 DIAGNOSIS — K219 Gastro-esophageal reflux disease without esophagitis: Secondary | ICD-10-CM

## 2020-11-07 MED ORDER — METHYLPREDNISOLONE ACETATE 80 MG/ML IJ SUSP
80.0000 mg | Freq: Once | INTRAMUSCULAR | Status: AC
Start: 2020-11-07 — End: 2020-11-07
  Administered 2020-11-07: 80 mg via INTRAMUSCULAR

## 2020-11-07 NOTE — Assessment & Plan Note (Signed)
Similar pattern for her. Very nonspecific- could be viral URI or Spring pollen allergy. Not a bacterial infection at this point. Not clear if she refluxes some in sleep to cause pharyngeal irritation. She may be getting exposed at church.  Plan- continue otc symptom remedies. Depomedrol.

## 2020-11-07 NOTE — Telephone Encounter (Signed)
Called and spoke with pt. Pt wants to know if there was any maintenance treatment that she could be placed on to help prevent flare ups or exacerbation with her bronchiectasis. Dr. Annamaria Boots, please advise.

## 2020-11-07 NOTE — Progress Notes (Signed)
Patient ID: Samantha [name], female    DOB: 03-20-1946, 75 y.o.   MRN: [id number]  HPI female never smoker followed for allergic rhinitis, asthma, bronchiectasis,  complicated by GERD/ LPR, Raynaud's, HBP, IBS, Glaucoma , Aortic Atherosclerosis,  Barium swallow 01/09/2016-normal PFT 06/05/2019- Mild restriction, normal flows and Diffusion --------------------------------------------------------------------------------------   08/30/20- 75 year old female never smoker followed for allergic rhinitis, asthma, bronchiectasis, complicated by GERD/ LPR, Raynaud's, HBP, IBS, Glaucoma Albuterol hfa, azelastine nasal, Qnasl, Symbicort 160, singulair, Zyrtec, Protonix, Carafate,  Covid vax-2 Phizer Flu vax- had Treated augmentin 11/17.  Dx'd RLL pneumonia at urgent care in [facility] 12/13.Rx'd amox 500 TID x 10 days, Zpak 250 mg. Says she has had 2 other episodes of bronchitis since Feb, Rx'd by her PCP. Now out of Symbicort- using rescue HFA 2x/ day. Has laryngitis. Brother [name] had Covid 12/5 for second time after 2 vax. She sat with him at church on 12/5, hen 5 days later began developing chest congestion, cough, laryngitis. Home tested Neg for Covid, then antibody tested neg at [place] in [place] to Urgent Care 12/13 as above. Had EKG for CP, Neg. CXR done and told RLL pneumonia.  Then had some L lat chest pains, then bilat flank pain w/ o dysuria.  Has finished Zpak and almost finished amoxacillin. Mostly dry cough.  Shows me tender pretibial area R lower leg, tender area about 2 inches down from mid R clavicle, and a "spot" on R antecubital area- none evident on exam.  CXR 06/25/20-  IMPRESSION: Diffuse bilateral interstitial pulmonary opacity, consistent with edema or infection. No focal airspace opacity. CXR 08/30/20- image reivewed FINDINGS: Persistent interstitial prominence. No new consolidation. No pleural effusion. Stable cardiomediastinal contours with normal heart  size. No acute osseous abnormality. IMPRESSION: Persistent nonspecific interstitial prominence.  No new findings.  11/07/20- 75 year old female never smoker followed for Allergic Rhinitis, Asthma, Bronchiectasis, complicated by GERD/ LPR, Raynaud's, HBP, IBS, Glaucoma -Albuterol hfa, azelastine nasal, Qnasl, Symbicort 160, singulair, Zyrtec, Protonix, Carafate, Covid vax-3 Phizer Flu vax-had -----Sob increased x 2 wks.., cough-white, head congestion, no fcs,hoarseness Covid tested neg on 2/23.. Does feel hoarse/ laryngitis. Mainly some head congestion with a little cough. Asks if reflux could do this. Not outdoors much. No recognized sick exposure but does go to church Taking Mucinex and a Tylenol Cold and Flu product.  Review of Systems-see HPI   + = positive Constitutional:   No weight loss, night sweats,  Fevers, chills, fatigue, lassitude. HEENT:   No headaches,  Difficulty swallowing,  Tooth/dental problems,  Sore throat,                No sneezing, itching, or ear ache,   nasal congestion, post nasal drip, CV:  + chest pain, orthopnea, PND, swelling in lower extremities, anasarca, dizziness, palpitations GI  No heartburn, indigestion, abdominal pain, nausea, vomiting,  Resp: No shortness of breath with exertion or at rest.  No excess mucus, productive cough,                                  +-non-productive cough,  No coughing up of blood.  No change in color of mucus.  +infrequent wheezing.   Skin: Clear GU:  MS:  No joint pain or swelling.   Psych:  No change in mood or affect. No depression or anxiety.  No memory loss.   Objective:   Physical Exam General- Alert, Oriented,  Affect-appropriate, Distress- none acute; pleasant  Skin- clear Lymphadenopathy- none Head- atraumatic            Eyes- Gross vision intact, PERRLA, conjunctivae clear secretions,            Ears- Normal hearing            Nose- clear, no-Septal dev, mucus, polyps, erosion, perforation .                         Throat- Malampatti III.   TMs not  retracted , mucosa not red , drainage- none,                     tonsils- atrophic;  + hoarse Neck- flexible , trachea midline, no stridor , thyroid nl, carotid no bruit Chest - symmetrical excursion , unlabored           Heart/CV- RRR , no murmur , no gallop  , no rub, nl s1 s2                           - JVD- none , edema-none, stasis changes- none, varices- none           Lung-  wheeze or rhonchi-none, cough+ mild dry , dullness-none, rub- none           Chest wall-  Abd-  Br/ Gen/ Rectal- Not done, not indicated Extrem- cyanosis- none, clubbing, none, atrophy- none, strength- nl.  Neuro- grossly intact to observation

## 2020-11-07 NOTE — Telephone Encounter (Signed)
Please let her try 3 samples of Trelegy 200     inhale 1 puff then rinse mouth well, once daily     Try this for 3 weeks instead of Symbicort

## 2020-11-07 NOTE — Patient Instructions (Addendum)
Order- depo 80   Dx  Exacerbation asthmatic bronchitis  Ok to continue your otc symptom medicines as needed  You can cancel the March 21 appointment, but call if you need [place].

## 2020-11-07 NOTE — Assessment & Plan Note (Signed)
Emphasized ongoing reflux precautions. Not clear that she has had any recent definite event.

## 2020-11-08 MED ORDER — TRELEGY ELLIPTA 200-62.5-25 MCG/INH IN AEPB: 1.0000 | INHALATION_SPRAY | Freq: Every day | RESPIRATORY_TRACT | 0 refills | Status: DC

## 2020-11-08 NOTE — Telephone Encounter (Signed)
Called and spoke with patient to let her know recs of Dr. Annamaria Boots and that he would like to give her samples of Trelegy inhaler. Advised her that I would put them upfront for her to pick up. She expressed understanding and stated she would get here when she can to pick them up. Went and got clarification from Dr. Annamaria Boots about how many samples since he mentioned her using it for 3 weeks and he advised 2 samples instead of 3. Nothing further needed at this time.

## 2020-11-28 ENCOUNTER — Ambulatory Visit: Payer: Medicare Other | Admitting: Internal Medicine

## 2020-12-02 ENCOUNTER — Other Ambulatory Visit: Payer: Self-pay | Admitting: Family Medicine

## 2020-12-02 DIAGNOSIS — Z1231 Encounter for screening mammogram for malignant neoplasm of breast: Secondary | ICD-10-CM

## 2020-12-13 DIAGNOSIS — J310 Chronic rhinitis: Secondary | ICD-10-CM | POA: Diagnosis not present

## 2020-12-13 DIAGNOSIS — R0989 Other specified symptoms and signs involving the circulatory and respiratory systems: Secondary | ICD-10-CM | POA: Diagnosis not present

## 2020-12-13 DIAGNOSIS — K219 Gastro-esophageal reflux disease without esophagitis: Secondary | ICD-10-CM | POA: Diagnosis not present

## 2020-12-22 ENCOUNTER — Ambulatory Visit: Payer: Medicare Other | Admitting: Internal Medicine

## 2020-12-27 NOTE — Progress Notes (Deleted)
Office Visit Note  Patient: Samantha [name]             Date of Birth: December 22, 1945           MRN: [id number]             PCP: Tonia Ghent, MD Referring: Tonia Ghent, MD Visit Date: 01/10/2021 Occupation: @GUAROCC @  Subjective:  No chief complaint on file.   History of Present Illness: Samantha [name] is a 75 y.o. female ***   Activities of Daily Living:  Patient reports morning stiffness for *** {minute/hour:19697}.   Patient {ACTIONS;DENIES/REPORTS:21021675::"Denies"} nocturnal pain.  Difficulty dressing/grooming: {ACTIONS;DENIES/REPORTS:21021675::"Denies"} Difficulty climbing stairs: {ACTIONS;DENIES/REPORTS:21021675::"Denies"} Difficulty getting out of chair: {ACTIONS;DENIES/REPORTS:21021675::"Denies"} Difficulty using hands for taps, buttons, cutlery, and/or writing: {ACTIONS;DENIES/REPORTS:21021675::"Denies"}  No Rheumatology ROS completed.   PMFS History:  Patient Active Problem List   Diagnosis Date Noted  . DNR (do not resuscitate) 07/24/2020  . Tinea versicolor 05/30/2020  . Shoulder pain 12/24/2019  . Myalgia 12/24/2019  . Bronchiectasis without complication ([place]) 99/83/3825  . Dysphagia 07/23/2019  . Abdominal pain 07/23/2019  . Nonintractable headache 03/19/2019  . Paresthesia 03/19/2019  . Decreased grip strength 02/04/2019  . Itching 02/02/2019  . Local superficial swelling 02/02/2019  . Weight loss 02/02/2019  . Osteopenia 08/09/2018  . Chronic rhinitis 07/23/2018  . 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 Healthcare maintenance 07/16/2018  . Moderate persistent asthma with acute exacerbation 01/30/2018  . Advance care planning 01/02/2018  . Laryngitis 10/15/2017  . Vitamin D deficiency 07/08/2017  . 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/19/2017  . Primary osteoarthritis of both knees 01/19/2017  . Primary osteoarthritis of both feet 01/19/2017  . Cough 11/14/2016  . SOB (shortness of  breath) 11/14/2016  . Diverticulosis 03/19/2016  . Lumbar compression fracture ([place]) 06/20/2015  . Fibromyalgia 02/26/2013  . Allergic rhinitis due to pollen 11/20/2010  . 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 IRRITABLE BOWEL SYNDROME 04/06/2010  . THYROID NODULE, RIGHT 12/07/2009  . OSTEOARTHRITIS 11/28/2009  . FIBROIDS, UTERUS 05/10/2008  . Insomnia 05/10/2008  . ASYMPTOMATIC POSTMENOPAUSAL STATUS 05/10/2008  . Essential hypertension 01/28/2008  . HYPERCHOLESTEROLEMIA 01/07/2008  . GLAUCOMA 01/07/2008  . Gastroesophageal reflux disease 04/14/2007  . Asthma 04/14/2007    Past Medical History:  Diagnosis Date  . ALLERGIC RHINITIS 07/26/2008  . ASTHMA 04/14/2007  . Asthma   . ASYMPTOMATIC POSTMENOPAUSAL STATUS 05/10/2008  . Bronchiectasis ([place])   . CHEST PAIN 08/26/2008  . COLONIC POLYPS, HX OF 04/14/2007   colonic leiomyoma  . Decreased grip strength   . Episcleritis   . FIBROIDS, UTERUS 05/10/2008  . Fibromyalgia   . Gait abnormality   . GERD 04/14/2007  . HYPERCHOLESTEROLEMIA 01/07/2008  . HYPERGLYCEMIA 11/28/2009  . HYPERTENSION 01/28/2008  . INSOMNIA 05/10/2008  . Irritable bowel syndrome 04/06/2010  . OSTEOARTHRITIS 11/28/2009  . 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 SINUSITIS 02/10/2009    Family History  Problem Relation Age of Onset  . Diabetes Mother   . Hypertension Mother   . Glaucoma Mother   . Polymyositis Mother   . Heart disease Father        CHF  . Allergic rhinitis Father   . Asthma Father   . Other Father        sideoblastic anemia  . Allergic rhinitis Sister   . Asthma Sister   . Allergic rhinitis Brother   . Asthma Brother   . Prostate cancer Brother   . Allergic rhinitis Maternal  Aunt   . Asthma Maternal Aunt   . Allergic rhinitis Paternal Aunt   . Asthma Paternal Aunt   . Breast cancer Cousin   . Colon cancer Cousin   . Cancer Neg Hx        No FH of Colon Cancer  . Angioedema Neg Hx   . Atopy Neg Hx   . Immunodeficiency Neg Hx   . Urticaria Neg Hx   .  Eczema Neg Hx    Past Surgical History:  Procedure Laterality Date  . COLONOSCOPY W/ POLYPECTOMY    . ELECTROCARDIOGRAM  12/04/2006  . IR RADIOLOGY PERIPHERAL GUIDED IV START  04/21/2018  . IR US GUIDE VASC ACCESS LEFT  04/21/2018  . NASAL SEPTUM SURGERY    . Stress Cardiolite  02/13/2002   Social History   Social History Narrative   Lives alone (widowed 1998).   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      Does not have living will.  Does desire CPR.  Does not want life support for prolonged periods of time if futile.   2 sons and 1 daughter.        Right-handed.   No daily caffeine use.   Immunization History  Administered Date(s) Administered  . Fluad Quad(high Dose 65+) 05/07/2019, 05/24/2020  . Influenza Split 06/04/2011, 05/27/2012  . Influenza Whole 08/18/2008, 05/26/2010  . Influenza, High Dose Seasonal PF 05/22/2017, 06/11/2018  . Influenza,inj,Quad PF,6+ Mos 06/08/2013, 05/19/2014, 06/08/2015, 05/07/2016  . Influenza-Unspecified 05/17/2020  . PFIZER(Purple Top)SARS-COV-2 Vaccination 10/27/2019, 11/17/2019, 10/11/2020  . Pneumococcal Conjugate-13 03/17/2014  . Pneumococcal Polysaccharide-23 11/23/2009, 06/08/2015, 07/14/2018  . Td 11/09/2002  . Tdap 07/03/2013  . Zoster 06/17/2013  . Zoster Recombinat (Shingrix) 02/19/2019, 08/27/2019     Objective: Vital Signs: There were no vitals taken for this visit.   Physical Exam   Musculoskeletal Exam: ***  CDAI Exam: CDAI Score: -- Patient Global: --; Provider Global: -- Swollen: --; Tender: -- Joint Exam 01/10/2021   No joint exam has been documented for this visit   There is currently no information documented on the homunculus. Go to the Rheumatology activity and complete the homunculus joint exam.  Investigation: No additional findings.  Imaging: No results found.  Recent Labs: Lab Results  Component Value Date   WBC 6.1 08/30/2020   HGB 12.8 08/30/2020   PLT 216.0 08/30/2020   NA 137  08/30/2020   K 3.8 08/30/2020   CL 103 08/30/2020   CO2 30 08/30/2020   GLUCOSE 94 08/30/2020   BUN 15 08/30/2020   CREATININE 0.89 08/30/2020   BILITOT 0.4 08/30/2020   ALKPHOS 54 08/30/2020   AST 16 08/30/2020   ALT 8 08/30/2020   PROT 7.3 08/30/2020   ALBUMIN 4.4 08/30/2020   CALCIUM 10.2 08/30/2020   GFRAA >60 03/10/2019    Speciality Comments: No specialty comments available.  Procedures:  No procedures performed Allergies: Cefuroxime axetil, Doxycycline, Iodinated diagnostic agents, Latex, Lisinopril, Lovastatin, Other, and Sulfonamide derivatives   Assessment / Plan:     Visit Diagnoses: No diagnosis found.  Orders: No orders of the defined types were placed in this encounter.  No orders of the defined types were placed in this encounter.   Face-to-face time spent with patient was *** minutes. Greater than 50% of time was spent in counseling and coordination of care.  Follow-Up Instructions: No follow-ups on file.   Earnestine Mealing, CMA  Note - This record has been created using Editor, commissioning.  Chart creation errors have been sought, but may not always  have been located. Such creation errors do not reflect on  the standard of medical care.

## 2021-01-05 ENCOUNTER — Other Ambulatory Visit: Payer: Self-pay

## 2021-01-05 ENCOUNTER — Encounter: Payer: Self-pay | Admitting: Family Medicine

## 2021-01-05 ENCOUNTER — Ambulatory Visit (INDEPENDENT_AMBULATORY_CARE_PROVIDER_SITE_OTHER): Payer: Medicare Other | Admitting: Family Medicine

## 2021-01-05 VITALS — BP 124/76 | HR 68 | Temp 97.1°F | Ht 62.0 in | Wt 131.0 lb

## 2021-01-05 DIAGNOSIS — M8580 Other specified disorders of bone density and structure, unspecified site: Secondary | ICD-10-CM | POA: Diagnosis not present

## 2021-01-05 DIAGNOSIS — R5383 Other fatigue: Secondary | ICD-10-CM | POA: Diagnosis not present

## 2021-01-05 DIAGNOSIS — M79606 Pain in leg, unspecified: Secondary | ICD-10-CM | POA: Diagnosis not present

## 2021-01-05 DIAGNOSIS — R1011 Right upper quadrant pain: Secondary | ICD-10-CM | POA: Diagnosis not present

## 2021-01-05 DIAGNOSIS — R300 Dysuria: Secondary | ICD-10-CM | POA: Diagnosis not present

## 2021-01-05 DIAGNOSIS — R109 Unspecified abdominal pain: Secondary | ICD-10-CM | POA: Diagnosis not present

## 2021-01-05 LAB — LIPASE: Lipase: 24 U/L (ref 11.0–59.0)

## 2021-01-05 LAB — COMPREHENSIVE METABOLIC PANEL
ALT: 9 U/L (ref 0–35)
AST: 18 U/L (ref 0–37)
Albumin: 4.5 g/dL (ref 3.5–5.2)
Alkaline Phosphatase: 60 U/L (ref 39–117)
BUN: 12 mg/dL (ref 6–23)
CO2: 32 mEq/L (ref 19–32)
Calcium: 10.2 mg/dL (ref 8.4–10.5)
Chloride: 102 mEq/L (ref 96–112)
Creatinine, Ser: 0.87 mg/dL (ref 0.40–1.20)
GFR: 65.18 mL/min (ref >60.00)
Glucose, Bld: 89 mg/dL (ref 70–99)
Potassium: 4.2 mEq/L (ref 3.5–5.1)
Sodium: 140 mEq/L (ref 135–145)
Total Bilirubin: 0.7 mg/dL (ref 0.2–1.2)
Total Protein: 7.1 g/dL (ref 6.0–8.3)

## 2021-01-05 LAB — CBC WITH DIFFERENTIAL/PLATELET
Basophils Absolute: 0 10*3/uL (ref 0.0–0.1)
Basophils Relative: 0.4 % (ref 0.0–3.0)
Eosinophils Absolute: 0 10*3/uL (ref 0.0–0.7)
Eosinophils Relative: 0.6 % (ref 0.0–5.0)
HCT: 38.5 % (ref 36.0–46.0)
Hemoglobin: 13.5 g/dL (ref 12.0–15.0)
Lymphocytes Relative: 33.6 % (ref 12.0–46.0)
Lymphs Abs: 2.1 10*3/uL (ref 0.7–4.0)
MCHC: 35.2 g/dL (ref 30.0–36.0)
MCV: 85.5 fl (ref 78.0–100.0)
Monocytes Absolute: 0.5 10*3/uL (ref 0.1–1.0)
Monocytes Relative: 7.6 % (ref 3.0–12.0)
Neutro Abs: 3.6 10*3/uL (ref 1.4–7.7)
Neutrophils Relative %: 57.8 % (ref 43.0–77.0)
Platelets: 188 10*3/uL (ref 150.0–400.0)
RBC: 4.5 Mil/uL (ref 3.87–5.11)
RDW: 13.7 % (ref 11.5–15.5)
WBC: 6.2 10*3/uL (ref 4.0–10.5)

## 2021-01-05 LAB — POC URINALSYSI DIPSTICK (AUTOMATED)
Bilirubin, UA: NEGATIVE
Blood, UA: NEGATIVE
Glucose, UA: NEGATIVE
Ketones, UA: NEGATIVE
Leukocytes, UA: NEGATIVE
Nitrite, UA: NEGATIVE
Protein, UA: NEGATIVE
Spec Grav, UA: <=1.005 — AB (ref 1.010–1.025)
Urobilinogen, UA: 0.2 E.U./dL
pH, UA: 7.5 (ref 5.0–8.0)

## 2021-01-05 LAB — VITAMIN D 25 HYDROXY (VIT D DEFICIENCY, FRACTURES): VITD: 50.34 ng/mL (ref 30.00–100.00)

## 2021-01-05 LAB — TSH: TSH: 2.92 u[IU]/mL (ref 0.35–4.50)

## 2021-01-05 NOTE — Progress Notes (Signed)
This visit occurred during the SARS-CoV-2 public health emergency.  Safety protocols were in place, including screening questions prior to the visit, additional usage of staff PPE, and extensive cleaning of exam room while observing appropriate contact time as indicated for disinfecting solutions.  R sided abd pain started a few weeks ago.  Can go across the abdomen.  Had hard stools at the time.  She used stool softener/fiber supplement.  Was eating more yogurt.  Then started having normal BMs and sx improved, pain improved.  She has some discomfort with eating but that is better in the meantime. No blood in stools.  No fevers.    She has some fatigue.  Urinary frequency noted, with lower abd pressure.  Nocturia noted.  She noted a knot in the skin on the R abd, near the lower ribs.    R leg pain, going on for months.  Sometimes B legs are painful.  Can have episodic BLE swelling but not now.  She had pain walking.  No pain walking into clinic today.  Pain can happen at rest.  She can have radicular pain in the B legs.    History of osteopenia.  Recheck vitamin D pending.  She had pulmonary f/u in 10/2020.  D/w pt.   Meds, vitals, and allergies reviewed.   ROS: Per HPI unless specifically indicated in ROS section   GEN: nad, alert and oriented HEENT: ncat NECK: supple w/o LA CV: rrr.  PULM: ctab, no inc wob ABD: soft, +bs, mildly tender to palpation in the right upper quadrant without rebound.  Lower abdomen not tender.  On the right side of the abdomen just anterior to the midaxillary line, near the inferior edge of the ribs she has some nodularity in the skin that is palpable.  This feels superficial.  No erythema.  No bruising.  No discharge. EXT: no edema SKIN: no acute rash Right straight leg raise positive for reproducible pain along the quad.  Negative straight leg raise on left leg.  Midline back not tender to palpation.  Strength still intact for the bilateral lower extremities on  gross exam.

## 2021-01-05 NOTE — Patient Instructions (Signed)
Don't change your meds for now.  Take care.  Glad to see you. Go to the lab on the way out.   If you have mychart we'll likely use that to update you.    We'll call about the ultrasound.  I would avoid fatty foods in the meantime.

## 2021-01-06 ENCOUNTER — Telehealth: Payer: Self-pay

## 2021-01-06 LAB — URINE CULTURE
MICRO NUMBER:: 11826599
Result:: NO GROWTH
SPECIMEN QUALITY:: ADEQUATE

## 2021-01-06 NOTE — Telephone Encounter (Signed)
Spoke with patient and scheduled patient for her ultrasound.

## 2021-01-08 DIAGNOSIS — R5383 Other fatigue: Secondary | ICD-10-CM | POA: Insufficient documentation

## 2021-01-08 DIAGNOSIS — R1011 Right upper quadrant pain: Secondary | ICD-10-CM | POA: Insufficient documentation

## 2021-01-08 NOTE — Assessment & Plan Note (Signed)
Check CMP and lipase.  See notes on urinalysis and urine culture.  We will get ultrasound set up.  That should give a look at the nodularity in the skin described above.  This could be a small lipoma.  Still okay for outpatient follow-up.  I did advise her to avoid fatty foods.  Overall benign exam, given that her tenderness is mild.

## 2021-01-08 NOTE — Assessment & Plan Note (Signed)
See notes on vitamin D.

## 2021-01-08 NOTE — Assessment & Plan Note (Signed)
Unclear source.  Check TSH and CBC.  See orders.

## 2021-01-08 NOTE — Assessment & Plan Note (Signed)
She does not have symptoms suggestive of claudication.  Straight leg raise is positive for the right leg.  I suspect this is coming from her back but it is reasonable to check labs and ultrasound as described above.

## 2021-01-10 ENCOUNTER — Ambulatory Visit: Payer: Medicare Other | Admitting: Rheumatology

## 2021-01-10 DIAGNOSIS — M19011 Primary osteoarthritis, right shoulder: Secondary | ICD-10-CM

## 2021-01-10 DIAGNOSIS — M160 Bilateral primary osteoarthritis of hip: Secondary | ICD-10-CM

## 2021-01-10 DIAGNOSIS — Z8709 Personal history of other diseases of the respiratory system: Secondary | ICD-10-CM

## 2021-01-10 DIAGNOSIS — R5383 Other fatigue: Secondary | ICD-10-CM

## 2021-01-10 DIAGNOSIS — Z8781 Personal history of (healed) traumatic fracture: Secondary | ICD-10-CM

## 2021-01-10 DIAGNOSIS — M19071 Primary osteoarthritis, right ankle and foot: Secondary | ICD-10-CM

## 2021-01-10 DIAGNOSIS — Z8639 Personal history of other endocrine, nutritional and metabolic disease: Secondary | ICD-10-CM

## 2021-01-10 DIAGNOSIS — Z87442 Personal history of urinary calculi: Secondary | ICD-10-CM

## 2021-01-10 DIAGNOSIS — Z8719 Personal history of other diseases of the digestive system: Secondary | ICD-10-CM

## 2021-01-10 DIAGNOSIS — R519 Headache, unspecified: Secondary | ICD-10-CM

## 2021-01-10 DIAGNOSIS — G5603 Carpal tunnel syndrome, bilateral upper limbs: Secondary | ICD-10-CM

## 2021-01-10 DIAGNOSIS — M797 Fibromyalgia: Secondary | ICD-10-CM

## 2021-01-10 DIAGNOSIS — M170 Bilateral primary osteoarthritis of knee: Secondary | ICD-10-CM

## 2021-01-10 DIAGNOSIS — M19042 Primary osteoarthritis, left hand: Secondary | ICD-10-CM

## 2021-01-10 DIAGNOSIS — Z8669 Personal history of other diseases of the nervous system and sense organs: Secondary | ICD-10-CM

## 2021-01-10 DIAGNOSIS — Z8679 Personal history of other diseases of the circulatory system: Secondary | ICD-10-CM

## 2021-01-10 DIAGNOSIS — G4709 Other insomnia: Secondary | ICD-10-CM

## 2021-01-11 ENCOUNTER — Other Ambulatory Visit: Payer: Medicare Other

## 2021-01-13 ENCOUNTER — Ambulatory Visit
Admission: RE | Admit: 2021-01-13 | Discharge: 2021-01-13 | Disposition: A | Discharge status: Home / Self Care | Payer: Medicare Other | Source: Ambulatory Visit | Attending: Family Medicine | Admitting: Family Medicine

## 2021-01-13 DIAGNOSIS — K760 Fatty (change of) liver, not elsewhere classified: Secondary | ICD-10-CM | POA: Diagnosis not present

## 2021-01-13 DIAGNOSIS — R1011 Right upper quadrant pain: Secondary | ICD-10-CM

## 2021-01-17 ENCOUNTER — Telehealth: Payer: Self-pay

## 2021-01-17 NOTE — Telephone Encounter (Signed)
Patient called asking that Dr Damita Dunnings told her during her last visit that he would advise her if she needed to make any changes to her medications after [place] of her abdomen and the nurse did not mention anything about her medications yesterday. Also, patient wants to know what can be causing her symptom then?  Also, should patient keep her appointment with GI next week?

## 2021-01-18 NOTE — Telephone Encounter (Signed)
If she is some better, then I would not change anything in the meantime and I would definitely keep the follow-up with GI.  I think that would be better than changing her medications in the meantime.  Thanks.

## 2021-01-18 NOTE — Progress Notes (Deleted)
Office Visit Note  Patient: Samantha [name]             Date of Birth: 1946-07-10           MRN: [id number]             PCP: Tonia Ghent, MD Referring: Tonia Ghent, MD Visit Date: 01/31/2021 Occupation: @GUAROCC @  Subjective:  No chief complaint on file.   History of Present Illness: GLANDA SPANBAUER is a 75 y.o. female ***   Activities of Daily Living:  Patient reports morning stiffness for *** {minute/hour:19697}.   Patient {ACTIONS;DENIES/REPORTS:21021675::"Denies"} nocturnal pain.  Difficulty dressing/grooming: {ACTIONS;DENIES/REPORTS:21021675::"Denies"} Difficulty climbing stairs: {ACTIONS;DENIES/REPORTS:21021675::"Denies"} Difficulty getting out of chair: {ACTIONS;DENIES/REPORTS:21021675::"Denies"} Difficulty using hands for taps, buttons, cutlery, and/or writing: {ACTIONS;DENIES/REPORTS:21021675::"Denies"}  No Rheumatology ROS completed.   PMFS History:  Patient Active Problem List   Diagnosis Date Noted  . RUQ pain 01/08/2021  . Fatigue 01/08/2021  . DNR (do not resuscitate) 07/24/2020  . Tinea versicolor 05/30/2020  . Shoulder pain 12/24/2019  . Myalgia 12/24/2019  . Bronchiectasis without complication ([place]) 73/41/9379  . Dysphagia 07/23/2019  . Abdominal pain 07/23/2019  . Nonintractable headache 03/19/2019  . Paresthesia 03/19/2019  . Decreased grip strength 02/04/2019  . Itching 02/02/2019  . Local superficial swelling 02/02/2019  . Weight loss 02/02/2019  . Osteopenia 08/09/2018  . Chronic rhinitis 07/23/2018  . 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 Healthcare maintenance 07/16/2018  . Moderate persistent asthma with acute exacerbation 01/30/2018  . Advance care planning 01/02/2018  . Vitamin D deficiency 07/08/2017  . 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/19/2017  . Primary osteoarthritis of both knees 01/19/2017  . Primary osteoarthritis of both feet 01/19/2017  . Deviated septum  12/07/2016  . Cough 11/14/2016  . SOB (shortness of breath) 11/14/2016  . Diverticulosis 03/19/2016  . Lumbar compression fracture ([place]) 06/20/2015  . Fibromyalgia 02/26/2013  . Allergic rhinitis due to pollen 11/20/2010  . 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 IRRITABLE BOWEL SYNDROME 04/06/2010  . THYROID NODULE, RIGHT 12/07/2009  . OSTEOARTHRITIS 11/28/2009  . FIBROIDS, UTERUS 05/10/2008  . Insomnia 05/10/2008  . ASYMPTOMATIC POSTMENOPAUSAL STATUS 05/10/2008  . Essential hypertension 01/28/2008  . HYPERCHOLESTEROLEMIA 01/07/2008  . GLAUCOMA 01/07/2008  . Leg pain 08/29/2007  . Gastroesophageal reflux disease 04/14/2007  . Asthma 04/14/2007    Past Medical History:  Diagnosis Date  . ALLERGIC RHINITIS 07/26/2008  . ASTHMA 04/14/2007  . Asthma   . ASYMPTOMATIC POSTMENOPAUSAL STATUS 05/10/2008  . Bronchiectasis ([place])   . CHEST PAIN 08/26/2008  . COLONIC POLYPS, HX OF 04/14/2007   colonic leiomyoma  . Decreased grip strength   . Episcleritis   . FIBROIDS, UTERUS 05/10/2008  . Fibromyalgia   . Gait abnormality   . GERD 04/14/2007  . HYPERCHOLESTEROLEMIA 01/07/2008  . HYPERGLYCEMIA 11/28/2009  . HYPERTENSION 01/28/2008  . INSOMNIA 05/10/2008  . Irritable bowel syndrome 04/06/2010  . OSTEOARTHRITIS 11/28/2009  . 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 SINUSITIS 02/10/2009    Family History  Problem Relation Age of Onset  . Diabetes Mother   . Hypertension Mother   . Glaucoma Mother   . Polymyositis Mother   . Heart disease Father        CHF  . Allergic rhinitis Father   . Asthma Father   . Other Father        sideoblastic anemia  . Allergic rhinitis Sister   . Asthma Sister   . Allergic rhinitis Brother   .  Asthma Brother   . Prostate cancer Brother   . Allergic rhinitis Maternal Aunt   . Asthma Maternal Aunt   . Allergic rhinitis Paternal Aunt   . Asthma Paternal Aunt   . Breast cancer Cousin   . Colon cancer Cousin   . Cancer Neg Hx        No FH of Colon Cancer  . Angioedema  Neg Hx   . Atopy Neg Hx   . Immunodeficiency Neg Hx   . Urticaria Neg Hx   . Eczema Neg Hx    Past Surgical History:  Procedure Laterality Date  . COLONOSCOPY W/ POLYPECTOMY    . ELECTROCARDIOGRAM  12/04/2006  . IR RADIOLOGY PERIPHERAL GUIDED IV START  04/21/2018  . IR US GUIDE VASC ACCESS LEFT  04/21/2018  . NASAL SEPTUM SURGERY    . Stress Cardiolite  02/13/2002   Social History   Social History Narrative   Lives alone (widowed 1998).   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      Does not have living will.  Does desire CPR.  Does not want life support for prolonged periods of time if futile.   2 sons and 1 daughter.        Right-handed.   No daily caffeine use.   Immunization History  Administered Date(s) Administered  . Fluad Quad(high Dose 65+) 05/07/2019, 05/24/2020  . Influenza Split 06/04/2011, 05/27/2012  . Influenza Whole 08/18/2008, 05/26/2010  . Influenza, High Dose Seasonal PF 05/22/2017, 06/11/2018  . Influenza,inj,Quad PF,6+ Mos 06/08/2013, 05/19/2014, 06/08/2015, 05/07/2016  . Influenza-Unspecified 05/17/2020  . PFIZER(Purple Top)SARS-COV-2 Vaccination 10/27/2019, 11/17/2019, 10/11/2020  . Pneumococcal Conjugate-13 03/17/2014  . Pneumococcal Polysaccharide-23 11/23/2009, 06/08/2015, 07/14/2018  . Td 11/09/2002  . Tdap 07/03/2013  . Zoster 06/17/2013  . Zoster Recombinat (Shingrix) 02/19/2019, 08/27/2019     Objective: Vital Signs: There were no vitals taken for this visit.   Physical Exam   Musculoskeletal Exam: ***  CDAI Exam: CDAI Score: -- Patient Global: --; Provider Global: -- Swollen: --; Tender: -- Joint Exam 01/31/2021   No joint exam has been documented for this visit   There is currently no information documented on the homunculus. Go to the Rheumatology activity and complete the homunculus joint exam.  Investigation: No additional findings.  Imaging: US ABDOMEN LIMITED RUQ (LIVER/GB)  Result Date: 01/13/2021 CLINICAL  DATA:  Right upper quadrant pain. EXAM: ULTRASOUND ABDOMEN LIMITED RIGHT UPPER QUADRANT COMPARISON:  None. FINDINGS: Gallbladder: No gallstones or wall thickening visualized (1.9 mm). No sonographic Murphy sign noted by sonographer. Common bile duct: Diameter: 4.7 mm Liver: No focal lesion identified. Diffusely increased echogenicity of the liver parenchyma is seen. Portal vein is patent on color Doppler imaging with normal direction of blood flow towards the liver. Other: None. IMPRESSION: Fatty liver. Electronically Signed   By: Virgina Norfolk M.D.   On: 01/13/2021 23:59    Recent Labs: Lab Results  Component Value Date   WBC 6.2 01/05/2021   HGB 13.5 01/05/2021   PLT 188.0 01/05/2021   NA 140 01/05/2021   K 4.2 01/05/2021   CL 102 01/05/2021   CO2 32 01/05/2021   GLUCOSE 89 01/05/2021   BUN 12 01/05/2021   CREATININE 0.87 01/05/2021   BILITOT 0.7 01/05/2021   ALKPHOS 60 01/05/2021   AST 18 01/05/2021   ALT 9 01/05/2021   PROT 7.1 01/05/2021   ALBUMIN 4.5 01/05/2021   CALCIUM 10.2 01/05/2021   GFRAA >60 03/10/2019    Speciality  Comments: No specialty comments available.  Procedures:  No procedures performed Allergies: 2,4-d dimethylamine (amisol); Cefuroxime axetil; Doxycycline; Iodinated diagnostic agents; Latex; Lisinopril; Lovastatin; Other; and Sulfonamide derivatives   Assessment / Plan:     Visit Diagnoses: No diagnosis found.  Orders: No orders of the defined types were placed in this encounter.  No orders of the defined types were placed in this encounter.   Face-to-face time spent with patient was *** minutes. Greater than 50% of time was spent in counseling and coordination of care.  Follow-Up Instructions: No follow-ups on file.   Earnestine Mealing, CMA  Note - This record has been created using Editor, commissioning.  Chart creation errors have been sought, but may not always  have been located. Such creation errors do not reflect on  the standard of  medical care.

## 2021-01-18 NOTE — Telephone Encounter (Signed)
Spoke with patient and advised Dr. Damita Dunnings does not want to change anything at the moment and wants her to f/u with GI. Patient verbalized understanding and will f/u with GI.

## 2021-01-24 ENCOUNTER — Telehealth: Payer: Self-pay | Admitting: Internal Medicine

## 2021-01-24 MED ORDER — AMOXICILLIN 500 MG PO CAPS: 500.0000 mg | ORAL_CAPSULE | Freq: Two times a day (BID) | ORAL | 0 refills | Status: DC

## 2021-01-24 MED ORDER — PREDNISONE 10 MG PO TABS: ORAL_TABLET | ORAL | 0 refills | Status: AC

## 2021-01-24 NOTE — Telephone Encounter (Signed)
Prednisone 10 mg, # 20 4 X 2 DAYS, 3 X 2 DAYS, 2 X 2 DAYS, 1 X 2 DAYS  Amoxacillin 500 mg, # 14, 1 twice daily

## 2021-01-24 NOTE — Telephone Encounter (Signed)
Spoke to patient via telephone.  Patient reports of prod cough with thick white sputum, chest/head congestion and wheezing. Sx have been present since February but have worsened over the past two weeks. Sob is baseline.   Denied fever, chills or sweats.  She is using mucinex 1200mg  daily, Symbicort BID and ventolin 1-2 daily with temporary relief in sx.  Fully vaccinated against covid and flu.   Dr. Annamaria [name], please advise. Thanks  Current Outpatient Medications on File Prior to Visit  Medication Sig Dispense Refill  . acetaminophen (TYLENOL) 500 MG tablet Take 1,000 mg by mouth every 6 (six) hours as needed for mild pain or headache.    . albuterol (VENTOLIN HFA) 108 (90 Base) MCG/ACT inhaler Inhale 2 puffs into the lungs every 4 (four) hours as needed for wheezing or shortness of breath. 18 g 3  . Azelastine HCl 0.15 % SOLN Place 2 sprays into both nostrils 2 (two) times daily as needed (runny nose). 90 mL 4  . Beclomethasone Dipropionate (QNASL) 80 MCG/ACT AERS Place 2 sprays into both nostrils daily. Two sprays into both nostrils daily 10.6 g 3  . bifidobacterium infantis (ALIGN) capsule Take 1 capsule by mouth daily.    . budesonide-formoterol (SYMBICORT) 160-4.5 MCG/ACT inhaler Inhale 2 puffs into the lungs 2 (two) times daily. Rinse mouth 3 each 4  . cetirizine (ZYRTEC) 10 MG tablet Take 10 mg by mouth at bedtime.     . Cholecalciferol (VITAMIN D) 50 MCG (2000 UT) CAPS Take 1 capsule by mouth daily.    . Coenzyme Q10 (CO Q 10) 60 MG CAPS 1 capsule with a meal    . Fluticasone-Umeclidin-Vilant (TRELEGY ELLIPTA) 200-62.5-25 MCG/INH AEPB Inhale 1 puff into the lungs daily. 28 each 0  . losartan-hydrochlorothiazide (HYZAAR) 100-12.5 MG tablet Take 1 tablet by mouth daily. 90 tablet 3  . Magnesium Malate POWD (1250mg  tabs) 1 tablet by mouth qhs    . Manganese 50 MG TABS Take 50 mg by mouth at bedtime.     . montelukast (SINGULAIR) 10 MG tablet Take 1 tablet (10 mg total) by mouth at bedtime.  90 tablet 3  . Olopatadine HCl (PATADAY) 0.2 % SOLN Place 1 drop into both eyes daily as needed (itchy eyes).    . pantoprazole (PROTONIX) 40 MG tablet Take 1 tablet (40 mg total) by mouth 2 (two) times daily. 180 tablet 3  . Polyethyl Glycol-Propyl Glycol (SYSTANE) 0.4-0.3 % GEL ophthalmic gel Place 1 application into both eyes every 6 (six) hours as needed (dry eyes).    . Pseudoephedrine-Guaifenesin [phone] MG TB12 Take 1,200 mg by mouth daily.    Marland Kitchen TART CHERRY PO Take by mouth. Take 1 tablet one time a day by mouth    . thiamine (VITAMIN B-1) 50 MG tablet Take 100 mg by mouth daily.    . TURMERIC PO Take 1 tablet by mouth daily.      No current facility-administered medications on file prior to visit.    Allergies  Allergen Reactions  . 2,4-D Dimethylamine (Amisol)   . Cefuroxime Axetil     REACTION: pt not sure of reaction- she can tolerate amoxil  . Doxycycline Itching  . Iodinated Diagnostic Agents Other (See Comments) and Itching  . Latex Itching  . Lisinopril     Cough  . Lovastatin Other (See Comments)    Muscle aches   . Other Other (See Comments)  . Sulfonamide Derivatives     REACTION: pt not sure of reaction

## 2021-01-24 NOTE — Telephone Encounter (Signed)
Called and spoke with pt letting her know that we were going to send Rx for pred taper and abx to pharmacy for her and she verbalized understanding. Verified preferred pharmacy and sent meds in for pt. Nothing further needed.

## 2021-01-25 ENCOUNTER — Ambulatory Visit
Admission: RE | Admit: 2021-01-25 | Discharge: 2021-01-25 | Disposition: A | Discharge status: Home / Self Care | Payer: Medicare Other | Source: Ambulatory Visit | Attending: Family Medicine | Admitting: Family Medicine

## 2021-01-25 ENCOUNTER — Other Ambulatory Visit: Payer: Self-pay

## 2021-01-25 DIAGNOSIS — R109 Unspecified abdominal pain: Secondary | ICD-10-CM | POA: Diagnosis not present

## 2021-01-25 DIAGNOSIS — K219 Gastro-esophageal reflux disease without esophagitis: Secondary | ICD-10-CM | POA: Diagnosis not present

## 2021-01-25 DIAGNOSIS — K5900 Constipation, unspecified: Secondary | ICD-10-CM | POA: Diagnosis not present

## 2021-01-25 DIAGNOSIS — Z1231 Encounter for screening mammogram for malignant neoplasm of breast: Secondary | ICD-10-CM | POA: Diagnosis not present

## 2021-01-26 ENCOUNTER — Other Ambulatory Visit: Payer: Self-pay | Admitting: Family Medicine

## 2021-01-26 DIAGNOSIS — R928 Other abnormal and inconclusive findings on diagnostic imaging of breast: Secondary | ICD-10-CM

## 2021-01-29 ENCOUNTER — Other Ambulatory Visit: Payer: Self-pay | Admitting: Family Medicine

## 2021-01-29 DIAGNOSIS — M8580 Other specified disorders of bone density and structure, unspecified site: Secondary | ICD-10-CM

## 2021-01-31 ENCOUNTER — Ambulatory Visit: Payer: Medicare Other | Admitting: Rheumatology

## 2021-01-31 DIAGNOSIS — M19011 Primary osteoarthritis, right shoulder: Secondary | ICD-10-CM

## 2021-01-31 DIAGNOSIS — M19071 Primary osteoarthritis, right ankle and foot: Secondary | ICD-10-CM

## 2021-01-31 DIAGNOSIS — M797 Fibromyalgia: Secondary | ICD-10-CM

## 2021-01-31 DIAGNOSIS — Z87442 Personal history of urinary calculi: Secondary | ICD-10-CM

## 2021-01-31 DIAGNOSIS — M19041 Primary osteoarthritis, right hand: Secondary | ICD-10-CM

## 2021-01-31 DIAGNOSIS — M160 Bilateral primary osteoarthritis of hip: Secondary | ICD-10-CM

## 2021-01-31 DIAGNOSIS — Z8679 Personal history of other diseases of the circulatory system: Secondary | ICD-10-CM

## 2021-01-31 DIAGNOSIS — R5383 Other fatigue: Secondary | ICD-10-CM

## 2021-01-31 DIAGNOSIS — G5603 Carpal tunnel syndrome, bilateral upper limbs: Secondary | ICD-10-CM

## 2021-01-31 DIAGNOSIS — R519 Headache, unspecified: Secondary | ICD-10-CM

## 2021-01-31 DIAGNOSIS — Z8709 Personal history of other diseases of the respiratory system: Secondary | ICD-10-CM

## 2021-01-31 DIAGNOSIS — M170 Bilateral primary osteoarthritis of knee: Secondary | ICD-10-CM

## 2021-01-31 DIAGNOSIS — Z8639 Personal history of other endocrine, nutritional and metabolic disease: Secondary | ICD-10-CM

## 2021-01-31 DIAGNOSIS — G4709 Other insomnia: Secondary | ICD-10-CM

## 2021-01-31 DIAGNOSIS — Z8669 Personal history of other diseases of the nervous system and sense organs: Secondary | ICD-10-CM

## 2021-01-31 DIAGNOSIS — Z8781 Personal history of (healed) traumatic fracture: Secondary | ICD-10-CM

## 2021-01-31 DIAGNOSIS — Z8719 Personal history of other diseases of the digestive system: Secondary | ICD-10-CM

## 2021-02-07 NOTE — Progress Notes (Signed)
Office Visit Note  Patient: Samantha [name]             Date of Birth: [birth year]           MRN: [id number]             PCP: Tonia Ghent, MD Referring: Tonia Ghent, MD Visit Date: 02/20/2021 Occupation: @GUAROCC @  Subjective:  Pain in multiple joints   History of Present Illness: Samantha [name] is a 75 y.o. female with the history of osteoarthritis and fibromyalgia.  She got of pneumonia in December 2021.  She was treated with antibiotics and recovered.  She had another episode of bronchitis which eventually resolved.  She continues to have some stiffness and discomfort in her bilateral hands and bilateral feet.  She has off-and-on discomfort in her shoulders and her hips.  She has not noticed any joint swelling.  She also has muscle cramps in her extremities.  She has generalized discomfort from fibromyalgia.  Activities of Daily Living:  Patient reports morning stiffness for 5-10  minutes.   Patient Reports nocturnal pain.  Difficulty dressing/grooming: Denies Difficulty climbing stairs: Reports Difficulty getting out of chair: Denies Difficulty using hands for taps, buttons, cutlery, and/or writing: Denies  Review of Systems  Constitutional:  Positive for fatigue.  HENT:  Negative for mouth sores, mouth dryness and nose dryness.   Eyes:  Positive for pain, itching and dryness.  Respiratory:  Positive for shortness of breath.   Cardiovascular:  Negative for palpitations.  Gastrointestinal:  Negative for blood in stool, constipation and diarrhea.  Endocrine: Negative for increased urination.  Genitourinary:  Negative for difficulty urinating.  Musculoskeletal:  Positive for joint pain, joint pain, myalgias, morning stiffness, muscle tenderness and myalgias. Negative for joint swelling.  Skin:  Negative for color change, rash and redness.  Allergic/Immunologic: Positive for susceptible to infections.  Neurological:  Positive for numbness and headaches. Negative for  dizziness, memory loss and weakness.  Hematological:  Positive for bruising/bleeding tendency.  Psychiatric/Behavioral:  Negative for confusion.    PMFS History:  Patient Active Problem List   Diagnosis Date Noted   RUQ pain 01/08/2021   Fatigue 01/08/2021   DNR (do not resuscitate) 07/24/2020   Tinea versicolor 05/30/2020   Shoulder pain 12/24/2019   Myalgia 12/24/2019   Bronchiectasis without complication ([place]) 44/11/4740   Dysphagia 07/23/2019   Abdominal pain 07/23/2019   Nonintractable headache 03/19/2019   Paresthesia 03/19/2019   Decreased grip strength 02/04/2019   Itching 02/02/2019   Local superficial swelling 02/02/2019   Weight loss 02/02/2019   Osteopenia 08/09/2018   Chronic rhinitis 07/23/2018   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   Healthcare maintenance 07/16/2018   Moderate persistent asthma with acute exacerbation 01/30/2018   Advance care planning 01/02/2018   Vitamin D deficiency 07/08/2017   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/19/2017   Primary osteoarthritis of both knees 01/19/2017   Primary osteoarthritis of both feet 01/19/2017   Deviated septum 12/07/2016   Cough 11/14/2016   SOB (shortness of breath) 11/14/2016   Diverticulosis 03/19/2016   Lumbar compression fracture ([place]) 06/20/2015   Fibromyalgia 02/26/2013   Allergic rhinitis due to pollen 11/20/2010   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   IRRITABLE BOWEL SYNDROME 04/06/2010   THYROID NODULE, RIGHT 12/07/2009   OSTEOARTHRITIS 11/28/2009   FIBROIDS, UTERUS 05/10/2008   Insomnia 05/10/2008   ASYMPTOMATIC POSTMENOPAUSAL STATUS 05/10/2008   Essential hypertension 01/28/2008   HYPERCHOLESTEROLEMIA 01/07/2008   GLAUCOMA  01/07/2008   Leg pain 08/29/2007   Gastroesophageal reflux disease 04/14/2007   Asthma 04/14/2007    Past Medical History:  Diagnosis Date   ALLERGIC RHINITIS 07/26/2008   ASTHMA 04/14/2007   Asthma    ASYMPTOMATIC  POSTMENOPAUSAL STATUS 05/10/2008   Bronchiectasis ([place])    CHEST PAIN 08/26/2008   COLONIC POLYPS, HX OF 04/14/2007   colonic leiomyoma   Decreased grip strength    Episcleritis    FIBROIDS, UTERUS 05/10/2008   Fibromyalgia    Gait abnormality    GERD 04/14/2007   HYPERCHOLESTEROLEMIA 01/07/2008   HYPERGLYCEMIA 11/28/2009   HYPERTENSION 01/28/2008   INSOMNIA 05/10/2008   Irritable bowel syndrome 04/06/2010   OSTEOARTHRITIS 11/28/2009   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   SINUSITIS 02/10/2009    Family History  Problem Relation Age of Onset   Diabetes Mother    Hypertension Mother    Glaucoma Mother    Polymyositis Mother    Heart disease Father        CHF   Allergic rhinitis Father    Asthma Father    Other Father        sideoblastic anemia   Allergic rhinitis Sister    Asthma Sister    Allergic rhinitis Brother    Asthma Brother    Prostate cancer Brother    Allergic rhinitis Maternal Aunt    Asthma Maternal Aunt    Allergic rhinitis Paternal Aunt    Asthma Paternal Aunt    Breast cancer Cousin    Colon cancer Cousin    Cancer Neg Hx        No FH of Colon Cancer   Angioedema Neg Hx    Atopy Neg Hx    Immunodeficiency Neg Hx    Urticaria Neg Hx    Eczema Neg Hx    Past Surgical History:  Procedure Laterality Date   COLONOSCOPY W/ POLYPECTOMY     ELECTROCARDIOGRAM  12/04/2006   IR RADIOLOGY PERIPHERAL GUIDED IV START  04/21/2018   IR US GUIDE VASC ACCESS LEFT  04/21/2018   NASAL SEPTUM SURGERY     Stress Cardiolite  02/13/2002   Social History   Social History Narrative   Lives alone (widowed 1998).   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      Does not have living will.  Does desire CPR.  Does not want life support for prolonged periods of time if futile.   2 sons and 1 daughter.        Right-handed.   No daily caffeine use.   Immunization History  Administered Date(s) Administered   Fluad Quad(high Dose 65+) 05/07/2019, 05/24/2020   Influenza Split  06/04/2011, 05/27/2012   Influenza Whole 08/18/2008, 05/26/2010   Influenza, High Dose Seasonal PF 05/22/2017, 06/11/2018   Influenza,inj,Quad PF,6+ Mos 06/08/2013, 05/19/2014, 06/08/2015, 05/07/2016   Influenza-Unspecified 05/17/2020   PFIZER(Purple Top)SARS-COV-2 Vaccination 10/27/2019, 11/17/2019, 10/11/2020   Pneumococcal Conjugate-13 03/17/2014   Pneumococcal Polysaccharide-23 11/23/2009, 06/08/2015, 07/14/2018   Td 11/09/2002   Tdap 07/03/2013   Zoster Recombinat (Shingrix) 02/19/2019, 08/27/2019   Zoster, Live 06/17/2013     Objective: Vital Signs: BP 134/75 (BP Location: Left Arm, Patient Position: Sitting, Cuff Size: Normal)   Pulse 73   Ht 5\' 2"  (1.575 m)   Wt 133 lb 9.6 oz (60.6 kg)   BMI 24.44 kg/m    Physical Exam Vitals and nursing note reviewed.  Constitutional:      Appearance: She is well-developed.  HENT:  Head: Normocephalic and atraumatic.  Eyes:     Conjunctiva/sclera: Conjunctivae normal.  Cardiovascular:     Rate and Rhythm: Normal rate and regular rhythm.     Heart sounds: Normal heart sounds.  Pulmonary:     Effort: Pulmonary effort is normal.     Breath sounds: Normal breath sounds.  Abdominal:     General: Bowel sounds are normal.     Palpations: Abdomen is soft.  Musculoskeletal:     Cervical back: Normal range of motion.  Lymphadenopathy:     Cervical: No cervical adenopathy.  Skin:    General: Skin is warm and dry.     Capillary Refill: Capillary refill takes less than 2 seconds.  Neurological:     Mental Status: She is alert and oriented to person, place, and time.  Psychiatric:        Behavior: Behavior normal.     Musculoskeletal Exam: C-spine was in good range of motion.  Shoulder joints, elbow joints, wrist joints were in good range of motion.  She had bilateral PIP and DIP thickening with no synovitis.  Hip joints, and knee joints were in good range of motion.  She has some discomfort range of motion of her left hip joint.   Some PIP and DIP thickening in her feet was noted without any synovitis.  CDAI Exam: CDAI Score: -- Patient Global: --; Provider Global: -- Swollen: --; Tender: -- Joint Exam 02/20/2021   No joint exam has been documented for this visit   There is currently no information documented on the homunculus. Go to the Rheumatology activity and complete the homunculus joint exam.  Investigation: No additional findings.  Imaging: US BREAST LTD UNI LEFT INC AXILLA  Result Date: 02/17/2021 CLINICAL DATA:  Recall from screening to evaluate a possible left breast mass. EXAM: DIGITAL DIAGNOSTIC UNILATERAL LEFT MAMMOGRAM WITH TOMOSYNTHESIS AND CAD; ULTRASOUND LEFT BREAST LIMITED TECHNIQUE: Left digital diagnostic mammography and breast tomosynthesis was performed. The images were evaluated with computer-aided detection.; Targeted ultrasound examination of the left breast was performed COMPARISON:  Previous exam(s). ACR Breast Density Category c: The breast tissue is heterogeneously dense, which may obscure small masses. FINDINGS: Additional spot compression CC and true lateral images demonstrate a 3-4 mm oval circumscribed mass over the posterior lower central breast. Targeted ultrasound is performed, showing a small simple cyst over the 5 o'clock position of the left breast 4 cm from the nipple corresponding to the mammographic finding and measuring 2 x 2 x 3 mm. IMPRESSION: 3 mm cyst over the 5 o'clock position of the left breast. RECOMMENDATION: Recommend continued annual bilateral screening mammographic follow-up. I have discussed the findings and recommendations with the patient. If applicable, a reminder letter will be sent to the patient regarding the next appointment. BI-RADS CATEGORY  2: Benign. Electronically Signed   By: Marin Olp M.D.   On: 02/17/2021 11:43  MM DIAG BREAST TOMO UNI LEFT  Result Date: 02/17/2021 CLINICAL DATA:  Recall from screening to evaluate a possible left breast mass.  EXAM: DIGITAL DIAGNOSTIC UNILATERAL LEFT MAMMOGRAM WITH TOMOSYNTHESIS AND CAD; ULTRASOUND LEFT BREAST LIMITED TECHNIQUE: Left digital diagnostic mammography and breast tomosynthesis was performed. The images were evaluated with computer-aided detection.; Targeted ultrasound examination of the left breast was performed COMPARISON:  Previous exam(s). ACR Breast Density Category c: The breast tissue is heterogeneously dense, which may obscure small masses. FINDINGS: Additional spot compression CC and true lateral images demonstrate a 3-4 mm oval circumscribed mass over the posterior lower central  breast. Targeted ultrasound is performed, showing a small simple cyst over the 5 o'clock position of the left breast 4 cm from the nipple corresponding to the mammographic finding and measuring 2 x 2 x 3 mm. IMPRESSION: 3 mm cyst over the 5 o'clock position of the left breast. RECOMMENDATION: Recommend continued annual bilateral screening mammographic follow-up. I have discussed the findings and recommendations with the patient. If applicable, a reminder letter will be sent to the patient regarding the next appointment. BI-RADS CATEGORY  2: Benign. Electronically Signed   By: Marin Olp M.D.   On: 02/17/2021 11:43  MM 3D SCREEN BREAST BILATERAL  Result Date: 01/25/2021 CLINICAL DATA:  Screening. EXAM: DIGITAL SCREENING BILATERAL MAMMOGRAM WITH TOMOSYNTHESIS AND CAD TECHNIQUE: Bilateral screening digital craniocaudal and mediolateral oblique mammograms were obtained. Bilateral screening digital breast tomosynthesis was performed. The images were evaluated with computer-aided detection. COMPARISON:  Previous exams. ACR Breast Density Category c: The breast tissue is heterogeneously dense, which may obscure small masses. FINDINGS: In the left breast, a possible mass warrants further evaluation. In the right breast, no findings suspicious for malignancy. IMPRESSION: Further evaluation is suggested for a possible mass in the  left breast. RECOMMENDATION: Diagnostic mammogram and possibly ultrasound of the left breast. (Code:[id number]-L-31M) The patient will be contacted regarding the findings, and additional imaging will be scheduled. BI-RADS CATEGORY  0: Incomplete. Need additional imaging evaluation and/or prior mammograms for comparison. Electronically Signed   By: Everlean Alstrom M.D.   On: 01/25/2021 13:18     Recent Labs: Lab Results  Component Value Date   WBC 6.2 01/05/2021   HGB 13.5 01/05/2021   PLT 188.0 01/05/2021   NA 140 01/05/2021   K 4.2 01/05/2021   CL 102 01/05/2021   CO2 32 01/05/2021   GLUCOSE 89 01/05/2021   BUN 12 01/05/2021   CREATININE 0.87 01/05/2021   BILITOT 0.7 01/05/2021   ALKPHOS 60 01/05/2021   AST 18 01/05/2021   ALT 9 01/05/2021   PROT 7.1 01/05/2021   ALBUMIN 4.5 01/05/2021   CALCIUM 10.2 01/05/2021   GFRAA >60 03/10/2019    Speciality Comments: No specialty comments available.  Procedures:  No procedures performed Allergies: 2,4-d dimethylamine (amisol); Cefuroxime axetil; Doxycycline; Iodinated diagnostic agents; Latex; Lisinopril; Lovastatin; Other; and Sulfonamide derivatives   Assessment / Plan:     Visit Diagnoses: Primary osteoarthritis of both hands-she continues to have some pain and stiffness in her hands.  Joint protection muscle strengthening was discussed.  A handout on hand exercises was given.  Bilateral carpal tunnel syndrome - NCV with EMG on 05/06/19: moderate right carpal tunnel syndrome.  She is off and on discomfort.  Use of carpal tunnel brace was advised.  Primary osteoarthritis of both shoulders-she denies any shoulder joint discomfort today  Primary osteoarthritis of both hips-she has some limitation range of motion of her left hip joint.  She denies any chronic pain.  Have given her a handout on hip joint exercises  Primary osteoarthritis of both knees.-She complains of knee joint discomfort.  No warmth swelling or effusion was noted.  A handout  on knee joint is muscle strengthening exercises was given.  Primary osteoarthritis of both feet-she has some DIP and PIP prominence.  Proper fitting shoes were advised.  Fibromyalgia - She was evaluated by Dr. Krista Blue on 06/28/2020 who suggested a trial of Cymbalta.  She continues to have some generalized pain fatigue and myalgias.  Use of magnesium malate was discussed.  Other fatigue-related to fibromyalgia and insomnia.  Other insomnia-good sleep hygiene was discussed.  History of vitamin D deficiency-vitamin D supplement was advised.  Osteopenia of multiple sites - 08/04/18 DXA Tscore RFN -1.1.  She will get repeat DEXA scan.  Other medical problems are listed as follows:  History of kidney stones  History of asthma  History of glaucoma  History of diverticulosis  History of gastroesophageal reflux (GERD)  History of IBS  History of hypertension  History of hyperlipidemia  Orders: No orders of the defined types were placed in this encounter.  No orders of the defined types were placed in this encounter.   Follow-Up Instructions: Return in about 1 year (around 02/20/2022) for Osteoarthritis.   Bo Merino, MD  Note - This record has been created using Editor, commissioning.  Chart creation errors have been sought, but may not always  have been located. Such creation errors do not reflect on  the standard of medical care.

## 2021-02-15 DIAGNOSIS — Z6824 Body mass index (BMI) 24.0-24.9, adult: Secondary | ICD-10-CM | POA: Diagnosis not present

## 2021-02-17 ENCOUNTER — Other Ambulatory Visit: Payer: Self-pay

## 2021-02-17 ENCOUNTER — Ambulatory Visit
Admission: RE | Admit: 2021-02-17 | Discharge: 2021-02-17 | Disposition: A | Discharge status: Home / Self Care | Payer: Medicare Other | Source: Ambulatory Visit | Attending: Family Medicine | Admitting: Family Medicine

## 2021-02-17 DIAGNOSIS — R928 Other abnormal and inconclusive findings on diagnostic imaging of breast: Secondary | ICD-10-CM

## 2021-02-20 ENCOUNTER — Other Ambulatory Visit: Payer: Self-pay

## 2021-02-20 ENCOUNTER — Encounter: Payer: Self-pay | Admitting: Rheumatology

## 2021-02-20 ENCOUNTER — Ambulatory Visit (INDEPENDENT_AMBULATORY_CARE_PROVIDER_SITE_OTHER): Payer: Medicare Other | Admitting: Rheumatology

## 2021-02-20 VITALS — BP 134/75 | HR 73 | Ht 62.0 in | Wt 133.6 lb

## 2021-02-20 DIAGNOSIS — M797 Fibromyalgia: Secondary | ICD-10-CM

## 2021-02-20 DIAGNOSIS — Z8719 Personal history of other diseases of the digestive system: Secondary | ICD-10-CM

## 2021-02-20 DIAGNOSIS — M19071 Primary osteoarthritis, right ankle and foot: Secondary | ICD-10-CM

## 2021-02-20 DIAGNOSIS — M160 Bilateral primary osteoarthritis of hip: Secondary | ICD-10-CM | POA: Diagnosis not present

## 2021-02-20 DIAGNOSIS — Z8639 Personal history of other endocrine, nutritional and metabolic disease: Secondary | ICD-10-CM

## 2021-02-20 DIAGNOSIS — M19041 Primary osteoarthritis, right hand: Secondary | ICD-10-CM | POA: Diagnosis not present

## 2021-02-20 DIAGNOSIS — Z8709 Personal history of other diseases of the respiratory system: Secondary | ICD-10-CM

## 2021-02-20 DIAGNOSIS — M170 Bilateral primary osteoarthritis of knee: Secondary | ICD-10-CM

## 2021-02-20 DIAGNOSIS — Z87442 Personal history of urinary calculi: Secondary | ICD-10-CM

## 2021-02-20 DIAGNOSIS — G5603 Carpal tunnel syndrome, bilateral upper limbs: Secondary | ICD-10-CM

## 2021-02-20 DIAGNOSIS — M19072 Primary osteoarthritis, left ankle and foot: Secondary | ICD-10-CM

## 2021-02-20 DIAGNOSIS — R5383 Other fatigue: Secondary | ICD-10-CM

## 2021-02-20 DIAGNOSIS — Z8669 Personal history of other diseases of the nervous system and sense organs: Secondary | ICD-10-CM

## 2021-02-20 DIAGNOSIS — M19042 Primary osteoarthritis, left hand: Secondary | ICD-10-CM

## 2021-02-20 DIAGNOSIS — R519 Headache, unspecified: Secondary | ICD-10-CM

## 2021-02-20 DIAGNOSIS — M19012 Primary osteoarthritis, left shoulder: Secondary | ICD-10-CM

## 2021-02-20 DIAGNOSIS — M19011 Primary osteoarthritis, right shoulder: Secondary | ICD-10-CM

## 2021-02-20 DIAGNOSIS — G4709 Other insomnia: Secondary | ICD-10-CM

## 2021-02-20 DIAGNOSIS — Z8679 Personal history of other diseases of the circulatory system: Secondary | ICD-10-CM

## 2021-02-20 DIAGNOSIS — Z8781 Personal history of (healed) traumatic fracture: Secondary | ICD-10-CM

## 2021-02-20 DIAGNOSIS — M8589 Other specified disorders of bone density and structure, multiple sites: Secondary | ICD-10-CM | POA: Diagnosis not present

## 2021-02-20 NOTE — Patient Instructions (Addendum)
Hand Exercises Hand exercises can be helpful for almost anyone. These exercises can strengthen the hands, improve flexibility and movement, and increase blood flow to the hands. These results can make work and daily tasks easier. Hand exercises can be especially helpful for people who have joint pain from arthritis or have nerve damage from overuse (carpal tunnel syndrome). These exercises can also help people who have injured a hand. Exercises Most of these hand exercises are gentle stretching and motion exercises. It is usually safe to do them often throughout the day. Warming up your hands before exercise may help to reduce stiffness. You can do this with gentle massage orby placing your hands in warm water for 10-15 minutes. It is normal to feel some stretching, pulling, tightness, or mild discomfort as you begin new exercises. This will gradually improve. Stop an exercise right away if you feel sudden, severe pain or your pain gets worse. Ask your healthcare provider which exercises are best for you. Knuckle bend or "claw" fist Stand or sit with your arm, hand, and all five fingers pointed straight up. Make sure to keep your wrist straight during the exercise. Gently bend your fingers down toward your palm until the tips of your fingers are touching the top of your palm. Keep your big knuckle straight and just bend the small knuckles in your fingers. Hold this position for __________ seconds. Straighten (extend) your fingers back to the starting position. Repeat this exercise 5-10 times with each hand. Full finger fist Stand or sit with your arm, hand, and all five fingers pointed straight up. Make sure to keep your wrist straight during the exercise. Gently bend your fingers into your palm until the tips of your fingers are touching the middle of your palm. Hold this position for __________ seconds. Extend your fingers back to the starting position, stretching every joint fully. Repeat this  exercise 5-10 times with each hand. Straight fist Stand or sit with your arm, hand, and all five fingers pointed straight up. Make sure to keep your wrist straight during the exercise. Gently bend your fingers at the big knuckle, where your fingers meet your hand, and the middle knuckle. Keep the knuckle at the tips of your fingers straight and try to touch the bottom of your palm. Hold this position for __________ seconds. Extend your fingers back to the starting position, stretching every joint fully. Repeat this exercise 5-10 times with each hand. Tabletop Stand or sit with your arm, hand, and all five fingers pointed straight up. Make sure to keep your wrist straight during the exercise. Gently bend your fingers at the big knuckle, where your fingers meet your hand, as far down as you can while keeping the small knuckles in your fingers straight. Think of forming a tabletop with your fingers. Hold this position for __________ seconds. Extend your fingers back to the starting position, stretching every joint fully. Repeat this exercise 5-10 times with each hand. Finger spread Place your hand flat on a table with your palm facing down. Make sure your wrist stays straight as you do this exercise. Spread your fingers and thumb apart from each other as far as you can until you feel a gentle stretch. Hold this position for __________ seconds. Bring your fingers and thumb tight together again. Hold this position for __________ seconds. Repeat this exercise 5-10 times with each hand. Making circles Stand or sit with your arm, hand, and all five fingers pointed straight up. Make sure to keep your   wrist straight during the exercise. Make a circle by touching the tip of your thumb to the tip of your index finger. Hold for __________ seconds. Then open your hand wide. Repeat this motion with your thumb and each finger on your hand. Repeat this exercise 5-10 times with each hand. Thumb motion Sit  with your forearm resting on a table and your wrist straight. Your thumb should be facing up toward the ceiling. Keep your fingers relaxed as you move your thumb. Lift your thumb up as high as you can toward the ceiling. Hold for __________ seconds. Bend your thumb across your palm as far as you can, reaching the tip of your thumb for the small finger (pinkie) side of your palm. Hold for __________ seconds. Repeat this exercise 5-10 times with each hand. Grip strengthening  Hold a stress ball or other soft ball in the middle of your hand. Slowly increase the pressure, squeezing the ball as much as you can without causing pain. Think of bringing the tips of your fingers into the middle of your palm. All of your finger joints should bend when doing this exercise. Hold your squeeze for __________ seconds, then relax. Repeat this exercise 5-10 times with each hand. Contact a health care provider if: Your hand pain or discomfort gets much worse when you do an exercise. Your hand pain or discomfort does not improve within 2 hours after you exercise. If you have any of these problems, stop doing these exercises right away. Do not do them again unless your health care provider says that you can. Get help right away if: You develop sudden, severe hand pain or swelling. If this happens, stop doing these exercises right away. Do not do them again unless your health care provider says that you can. This information is not intended to replace advice given to you by your health care provider. Make sure you discuss any questions you have with your healthcare provider. Document Revised: 12/18/2018 Document Reviewed: 08/28/2018 Else[employer] Patient Education  [place].  Hip Exercises Ask your health care provider which exercises are safe for you. Do exercises exactly as told by your health care provider and adjust them as directed. It is normal to feel mild stretching, pulling, tightness, or discomfort as  you do these exercises. Stop right away if you feel sudden pain or your pain gets worse. Do not begin these exercises until told by your health care provider. Stretching and range-of-motion exercises These exercises warm up your muscles and joints and improve the movement and flexibility of your hip. These exercises also help to relieve pain, numbness, and tingling. You may be asked to limit your range of motion if you had a hipreplacement. Talk to your health care provider about these restrictions. Hamstrings, supine  Lie on your back (supine position). Loop a belt or towel over the ball of your left / right foot. The ball of your foot is on the walking surface, right under your toes. Straighten your left / right knee and slowly pull on the belt or towel to raise your leg until you feel a gentle stretch behind your knee (hamstring). Do not let your knee bend while you do this. Keep your other leg flat on the floor. Hold this position for __________ seconds. Slowly return your leg to the starting position. Repeat __________ times. Complete this exercise __________ times a day. Hip rotation  Lie on your back on a firm surface. With your left / right hand, gently pull  your left / right knee toward the shoulder that is on the same side of the body. Stop when your knee is pointing toward the ceiling. Hold your left / right ankle with your other hand. Keeping your knee steady, gently pull your left / right ankle toward your other shoulder until you feel a stretch in your buttocks. Keep your hips and shoulders firmly planted while you do this stretch. Hold this position for __________ seconds. Repeat __________ times. Complete this exercise __________ times a day. Seated stretch This exercise is sometimes called hamstrings and adductors stretch. Sit on the floor with your legs stretched wide. Keep your knees straight during this exercise. Keeping your head and back in a straight line, bend at your  waist to reach for your left foot (position A). You should feel a stretch in your right inner thigh (adductors). Hold this position for __________ seconds. Then slowly return to the upright position. Keeping your head and back in a straight line, bend at your waist to reach forward (position B). You should feel a stretch behind both of your thighs and knees (hamstrings). Hold this position for __________ seconds. Then slowly return to the upright position. Keeping your head and back in a straight line, bend at your waist to reach for your right foot (position C). You should feel a stretch in your left inner thigh (adductors). Hold this position for __________ seconds. Then slowly return to the upright position. Repeat __________ times. Complete this exercise __________ times a day. Lunge This exercise stretches the muscles of the hip (hip flexors). Place your left / right knee on the floor and bend your other knee so that is directly over your ankle. You should be half-kneeling. Keep good posture with your head over your shoulders. Tighten your buttocks to point your tailbone downward. This will prevent your back from arching too much. You should feel a gentle stretch in the front of your left / right thigh and hip. If you do not feel a stretch, slide your other foot forward slightly and then slowly lunge forward with your chest up until your knee once again lines up over your ankle. Make sure your tailbone continues to point downward. Hold this position for __________ seconds. Slowly return to the starting position. Repeat __________ times. Complete this exercise __________ times a day. Strengthening exercises These exercises build strength and endurance in your hip. Endurance is theability to use your muscles for a long time, even after they get tired. Bridge This exercise strengthens the muscles of your hip (hip extensors). Lie on your back on a firm surface with your knees bent and your feet  flat on the floor. Tighten your buttocks muscles and lift your bottom off the floor until the trunk of your body and your hips are level with your thighs. Do not arch your back. You should feel the muscles working in your buttocks and the back of your thighs. If you do not feel these muscles, slide your feet 1-2 inches (2.5-5 cm) farther away from your buttocks. Hold this position for __________ seconds. Slowly lower your hips to the starting position. Let your muscles relax completely between repetitions. Repeat __________ times. Complete this exercise __________ times a day. Straight leg raises, side-lying This exercise strengthens the muscles that move the hip joint away from the center of the body (hip abductors). Lie on your side with your left / right leg in the top position. Lie so your head, shoulder, hip, and knee line up.  You may bend your bottom knee slightly to help you balance. Roll your hips slightly forward, so your hips are stacked directly over each other and your left / right knee is facing forward. Leading with your heel, lift your top leg 4-6 inches (10-15 cm). You should feel the muscles in your top hip lifting. Do not let your foot drift forward. Do not let your knee roll toward the ceiling. Hold this position for __________ seconds. Slowly return to the starting position. Let your muscles relax completely between repetitions. Repeat __________ times. Complete this exercise __________ times a day. Straight leg raises, side-lying This exercise strengthens the muscles that move the hip joint toward the center of the body (hip adductors). Lie on your side with your left / right leg in the bottom position. Lie so your head, shoulder, hip, and knee line up. You may place your upper foot in front to help you balance. Roll your hips slightly forward, so your hips are stacked directly over each other and your left / right knee is facing forward. Tense the muscles in your inner  thigh and lift your bottom leg 4-6 inches (10-15 cm). Hold this position for __________ seconds. Slowly return to the starting position. Let your muscles relax completely between repetitions. Repeat __________ times. Complete this exercise __________ times a day. Straight leg raises, supine This exercise strengthens the muscles in the front of your thigh (quadriceps). Lie on your back (supine position) with your left / right leg extended and your other knee bent. Tense the muscles in the front of your left / right thigh. You should see your kneecap slide up or see increased dimpling just above your knee. Keep these muscles tight as you raise your leg 4-6 inches (10-15 cm) off the floor. Do not let your knee bend. Hold this position for __________ seconds. Keep these muscles tense as you lower your leg. Relax the muscles slowly and completely between repetitions. Repeat __________ times. Complete this exercise __________ times a day. Hip abductors, standing This exercise strengthens the muscles that move the leg and hip joint away from the center of the body (hip abductors). Tie one end of a rubber exercise band or tubing to a secure surface, such as a chair, table, or pole. Loop the other end of the band or tubing around your left / right ankle. Keeping your ankle with the band or tubing directly opposite the secured end, step away until there is tension in the tubing or band. Hold on to a chair, table, or pole as needed for balance. Lift your left / right leg out to your side. While you do this: Keep your back upright. Keep your shoulders over your hips. Keep your toes pointing forward. Make sure to use your hip muscles to slowly lift your leg. Do not tip your body or forcefully lift your leg. Hold this position for __________ seconds. Slowly return to the starting position. Repeat __________ times. Complete this exercise __________ times a day. Squats This exercise strengthens the  muscles in the front of your thigh (quadriceps). Stand in a door frame so your feet and knees are in line with the frame. You may place your hands on the frame for balance. Slowly bend your knees and lower your hips like you are going to sit in a chair. Keep your lower legs in a straight-up-and-down position. Do not let your hips go lower than your knees. Do not bend your knees lower than told by your health care  provider. If your hip pain increases, do not bend as low. Hold this position for ___________ seconds. Slowly push with your legs to return to standing. Do not use your hands to pull yourself to standing. Repeat __________ times. Complete this exercise __________ times a day. This information is not intended to replace advice given to you by your health care provider. Make sure you discuss any questions you have with your healthcare provider. Document Revised: 04/02/2019 Document Reviewed: 07/08/2018 Elsevier Patient Education  2022 [place] for [employer]ers, 15(4), [id number]. Retrieved June 16, 2018 from [email or web address].com/[email or web address]">  Knee Exercises Ask your health care provider which exercises are safe for you. Do exercises exactly as told by your health care provider and adjust them as directed. It is normal to feel mild stretching, pulling, tightness, or discomfort as you do these exercises. Stop right away if you feel sudden pain or your pain gets worse. Do not begin these exercises until told by your health care provider. Stretching and range-of-motion exercises These exercises warm up your muscles and joints and improve the movement and flexibility of your knee. These exercises also help to relieve pain andswelling. Knee extension, prone Lie on your abdomen (prone position) on a bed. Place your left / right knee just beyond the edge of the surface so your knee is not on the bed. You can put a towel under your left / right thigh just above your kneecap for  comfort. Relax your leg muscles and allow gravity to straighten your knee (extension). You should feel a stretch behind your left / right knee. Hold this position for __________ seconds. Scoot up so your knee is supported between repetitions. Repeat __________ times. Complete this exercise __________ times a day. Knee flexion, active  Lie on your back with both legs straight. If this causes back discomfort, bend your left / right knee so your foot is flat on the floor. Slowly slide your left / right heel back toward your buttocks. Stop when you feel a gentle stretch in the front of your knee or thigh (flexion). Hold this position for __________ seconds. Slowly slide your left / right heel back to the starting position. Repeat __________ times. Complete this exercise __________ times a day. Quadriceps stretch, prone  Lie on your abdomen on a firm surface, such as a bed or padded floor. Bend your left / right knee and hold your ankle. If you cannot reach your ankle or pant leg, loop a belt around your foot and grab the belt instead. Gently pull your heel toward your buttocks. Your knee should not slide out to the side. You should feel a stretch in the front of your thigh and knee (quadriceps). Hold this position for __________ seconds. Repeat __________ times. Complete this exercise __________ times a day. Hamstring, supine Lie on your back (supine position). Loop a belt or towel over the ball of your left / right foot. The ball of your foot is on the walking surface, right under your toes. Straighten your left / right knee and slowly pull on the belt to raise your leg until you feel a gentle stretch behind your knee (hamstring). Do not let your knee bend while you do this. Keep your other leg flat on the floor. Hold this position for __________ seconds. Repeat __________ times. Complete this exercise __________ times a day. Strengthening exercises These exercises build strength and  endurance in your knee. Endurance is theability to use your muscles for a long time, even after they  get tired. Quadriceps, isometric This exercise stretches the muscles in front of your thigh (quadriceps) without moving your knee joint (isometric). Lie on your back with your left / right leg extended and your other knee bent. Put a rolled towel or small pillow under your knee if told by your health care provider. Slowly tense the muscles in the front of your left / right thigh. You should see your kneecap slide up toward your hip or see increased dimpling just above the knee. This motion will push the back of the knee toward the floor. For __________ seconds, hold the muscle as tight as you can without increasing your pain. Relax the muscles slowly and completely. Repeat __________ times. Complete this exercise __________ times a day. Straight leg raises This exercise stretches the muscles in front of your thigh (quadriceps) and the muscles that move your hips (hip flexors). Lie on your back with your left / right leg extended and your other knee bent. Tense the muscles in the front of your left / right thigh. You should see your kneecap slide up or see increased dimpling just above the knee. Your thigh may even shake a bit. Keep these muscles tight as you raise your leg 4-6 inches (10-15 cm) off the floor. Do not let your knee bend. Hold this position for __________ seconds. Keep these muscles tense as you lower your leg. Relax your muscles slowly and completely after each repetition. Repeat __________ times. Complete this exercise __________ times a day. Hamstring, isometric Lie on your back on a firm surface. Bend your left / right knee about __________ degrees. Dig your left / right heel into the surface as if you are trying to pull it toward your buttocks. Tighten the muscles in the back of your thighs (hamstring) to "dig" as hard as you can without increasing any pain. Hold this position  for __________ seconds. Release the tension gradually and allow your muscles to relax completely for __________ seconds after each repetition. Repeat __________ times. Complete this exercise __________ times a day. Hamstring curls If told by your health care provider, do this exercise while wearing ankle weights. Begin with __________ lb weights. Then increase the weight by 1 lb (0.5 kg) increments. Do not wear ankle weights that are more than __________ lb. Lie on your abdomen with your legs straight. Bend your left / right knee as far as you can without feeling pain. Keep your hips flat against the floor. Hold this position for __________ seconds. Slowly lower your leg to the starting position. Repeat __________ times. Complete this exercise __________ times a day. Squats This exercise strengthens the muscles in front of your thigh and knee (quadriceps). Stand in front of a table, with your feet and knees pointing straight ahead. You may rest your hands on the table for balance but not for support. Slowly bend your knees and lower your hips like you are going to sit in a chair. Keep your weight over your heels, not over your toes. Keep your lower legs upright so they are parallel with the table legs. Do not let your hips go lower than your knees. Do not bend lower than told by your health care provider. If your knee pain increases, do not bend as low. Hold the squat position for __________ seconds. Slowly push with your legs to return to standing. Do not use your hands to pull yourself to standing. Repeat __________ times. Complete this exercise __________ times a day. Wall slides This exercise strengthens  the muscles in front of your thigh and knee (quadriceps). Lean your back against a smooth wall or door, and walk your feet out 18-24 inches (46-61 cm) from it. Place your feet hip-width apart. Slowly slide down the wall or door until your knees bend __________ degrees. Keep your knees  over your heels, not over your toes. Keep your knees in line with your hips. Hold this position for __________ seconds. Repeat __________ times. Complete this exercise __________ times a day. Straight leg raises This exercise strengthens the muscles that rotate the leg at the hip and move it away from your body (hip abductors). Lie on your side with your left / right leg in the top position. Lie so your head, shoulder, knee, and hip line up. You may bend your bottom knee to help you keep your balance. Roll your hips slightly forward so your hips are stacked directly over each other and your left / right knee is facing forward. Leading with your heel, lift your top leg 4-6 inches (10-15 cm). You should feel the muscles in your outer hip lifting. Do not let your foot drift forward. Do not let your knee roll toward the ceiling. Hold this position for __________ seconds. Slowly return your leg to the starting position. Let your muscles relax completely after each repetition. Repeat __________ times. Complete this exercise __________ times a day. Straight leg raises This exercise stretches the muscles that move your hips away from the front of the pelvis (hip extensors). Lie on your abdomen on a firm surface. You can put a pillow under your hips if that is more comfortable. Tense the muscles in your buttocks and lift your left / right leg about 4-6 inches (10-15 cm). Keep your knee straight as you lift your leg. Hold this position for __________ seconds. Slowly lower your leg to the starting position. Let your leg relax completely after each repetition. Repeat __________ times. Complete this exercise __________ times a day. This information is not intended to replace advice given to you by your health care provider. Make sure you discuss any questions you have with your healthcare provider. Document Revised: 06/17/2018 Document Reviewed: 06/17/2018 Elsevier Patient Education  2022 [employer].

## 2021-03-07 ENCOUNTER — Ambulatory Visit: Payer: Medicare Other | Admitting: Internal Medicine

## 2021-03-20 ENCOUNTER — Other Ambulatory Visit: Payer: Self-pay

## 2021-03-20 MED ORDER — LOSARTAN POTASSIUM-HCTZ 100-12.5 MG PO TABS: 1.0000 | ORAL_TABLET | Freq: Every day | ORAL | 3 refills | Status: DC

## 2021-03-20 MED ORDER — MONTELUKAST SODIUM 10 MG PO TABS: 10.0000 mg | ORAL_TABLET | Freq: Every day | ORAL | 3 refills | Status: DC

## 2021-03-28 DIAGNOSIS — H40003 Preglaucoma, unspecified, bilateral: Secondary | ICD-10-CM | POA: Diagnosis not present

## 2021-04-04 DIAGNOSIS — H40003 Preglaucoma, unspecified, bilateral: Secondary | ICD-10-CM | POA: Diagnosis not present

## 2021-04-06 DIAGNOSIS — M8588 Other specified disorders of bone density and structure, other site: Secondary | ICD-10-CM | POA: Diagnosis not present

## 2021-04-06 LAB — HM DEXA SCAN

## 2021-04-07 NOTE — Progress Notes (Signed)
Patient ID: Samantha [name], female    DOB: 1946-05-25, 75 y.o.   MRN: [id number]  HPI female never smoker followed for allergic rhinitis, asthma, bronchiectasis,  complicated by GERD/ LPR, Raynaud's, HBP, IBS, Glaucoma , Aortic Atherosclerosis,  Barium swallow 01/09/2016-normal PFT 06/05/2019- Mild restriction, normal flows and Diffusion --------------------------------------------------------------------------------------    11/07/20- 75 year old female never smoker followed for Allergic Rhinitis, Asthma, Bronchiectasis, complicated by GERD/ LPR, Raynaud's, HBP, IBS, Glaucoma -Albuterol hfa, azelastine nasal, Qnasl, Symbicort 160, singulair, Zyrtec, Protonix, Carafate, Covid vax-3 Phizer Flu vax-had -----Sob increased x 2 wks.., cough-white, head congestion, no fcs,hoarseness Covid tested neg on 2/23.. Does feel hoarse/ laryngitis. Mainly some head congestion with a little cough. Asks if reflux could do this. Not outdoors much. No recognized sick exposure but does go to church Taking Mucinex and a Tylenol Cold and Flu product.  47/51- 75 year old female never smoker followed for Allergic Rhinitis, Asthma, Bronchiectasis, complicated by GERD/ LPR, Raynaud's, HBP, IBS, Glaucoma -Albuterol hfa, azelastine nasal, Qnasl, Symbicort 160, singulair, Zyrtec, Protonix, Carafate, Covid vax-3 Phizer -----Sob, coughing w clear phlegm.  Nagging cough most of summer, scant white phlegm, no fever. Tussive point tenderness lower R midaxillary line- tender to pressure. No acute event or significant dyspnea. Worries about dampness in home. Post nasal drip- asks refill azelastine .Uses benzonatate only at night- makes her sleepy. CXR 08/30/20- IMPRESSION: Persistent nonspecific interstitial prominence.  No new findings.   Review of Systems-see HPI   + = positive Constitutional:   No weight loss, night sweats,  Fevers, chills, fatigue, lassitude. HEENT:   No headaches,  Difficulty swallowing,   Tooth/dental problems,  Sore throat,                No sneezing, itching, or ear ache,   nasal congestion, post nasal drip, CV:  + chest pain, orthopnea, PND, swelling in lower extremities, anasarca, dizziness, palpitations GI  No heartburn, indigestion, abdominal pain, nausea, vomiting,  Resp: No shortness of breath with exertion or at rest.  No excess mucus, productive cough,                                  +-non-productive cough,  No coughing up of blood.  No change in color of mucus.  +infrequent wheezing.   Skin: Clear GU:  MS:  No joint pain or swelling.   Psych:  No change in mood or affect. No depression or anxiety.  No memory loss.   Objective:   Physical Exam General- Alert, Oriented, Affect-appropriate, Distress- none acute; pleasant  Skin- clear Lymphadenopathy- none Head- atraumatic            Eyes- Gross vision intact, PERRLA, conjunctivae clear secretions,            Ears- Normal hearing            Nose- clear, no-Septal dev, mucus, polyps, erosion, perforation .                        Throat- Malampatti III.   TMs not  retracted , mucosa not red , drainage- none,                     tonsils- atrophic;   Neck- flexible , trachea midline, no stridor , thyroid nl, carotid no bruit Chest - symmetrical excursion , unlabored           Heart/CV-  RRR , no murmur , no gallop  , no rub, nl s1 s2                           - JVD- none , edema-none, stasis changes- none, varices- none           Lung-  wheeze or rhonchi-none, cough+ mild dry , dullness-none, rub- none, crackles- none           Chest wall-  Abd-  Br/ Gen/ Rectal- Not done, not indicated Extrem- cyanosis- none, clubbing, none, atrophy- none, strength- nl.  Neuro- grossly intact to observation

## 2021-04-10 ENCOUNTER — Ambulatory Visit (INDEPENDENT_AMBULATORY_CARE_PROVIDER_SITE_OTHER): Payer: Medicare Other

## 2021-04-10 ENCOUNTER — Encounter: Payer: Self-pay | Admitting: Internal Medicine

## 2021-04-10 ENCOUNTER — Ambulatory Visit (INDEPENDENT_AMBULATORY_CARE_PROVIDER_SITE_OTHER): Payer: Medicare Other | Admitting: Internal Medicine

## 2021-04-10 ENCOUNTER — Other Ambulatory Visit: Payer: Self-pay

## 2021-04-10 VITALS — BP 146/60 | HR 69 | Temp 98.3°F | Ht 62.0 in | Wt 132.4 lb

## 2021-04-10 DIAGNOSIS — R053 Chronic cough: Secondary | ICD-10-CM

## 2021-04-10 DIAGNOSIS — J3089 Other allergic rhinitis: Secondary | ICD-10-CM

## 2021-04-10 DIAGNOSIS — J302 Other seasonal allergic rhinitis: Secondary | ICD-10-CM | POA: Diagnosis not present

## 2021-04-10 DIAGNOSIS — J479 Bronchiectasis, uncomplicated: Secondary | ICD-10-CM

## 2021-04-10 LAB — CBC WITH DIFFERENTIAL/PLATELET
Basophils Absolute: 0 10*3/uL (ref 0.0–0.1)
Basophils Relative: 0.6 % (ref 0.0–3.0)
Eosinophils Absolute: 0 10*3/uL (ref 0.0–0.7)
Eosinophils Relative: 0.6 % (ref 0.0–5.0)
HCT: 38.3 % (ref 36.0–46.0)
Hemoglobin: 13.1 g/dL (ref 12.0–15.0)
Lymphocytes Relative: 30.9 % (ref 12.0–46.0)
Lymphs Abs: 1.9 10*3/uL (ref 0.7–4.0)
MCHC: 34.1 g/dL (ref 30.0–36.0)
MCV: 85.8 fl (ref 78.0–100.0)
Monocytes Absolute: 0.4 10*3/uL (ref 0.1–1.0)
Monocytes Relative: 7 % (ref 3.0–12.0)
Neutro Abs: 3.7 10*3/uL (ref 1.4–7.7)
Neutrophils Relative %: 60.9 % (ref 43.0–77.0)
Platelets: 194 10*3/uL (ref 150.0–400.0)
RBC: 4.46 Mil/uL (ref 3.87–5.11)
RDW: 13.6 % (ref 11.5–15.5)
WBC: 6.1 10*3/uL (ref 4.0–10.5)

## 2021-04-10 MED ORDER — BENZONATATE 200 MG PO CAPS: 200.0000 mg | ORAL_CAPSULE | Freq: Three times a day (TID) | ORAL | 4 refills | Status: DC | PRN

## 2021-04-10 MED ORDER — AZELASTINE HCL 0.15 % NA SOLN: 2.0000 | Freq: Two times a day (BID) | NASAL | 12 refills | Status: DC | PRN

## 2021-04-10 NOTE — Assessment & Plan Note (Signed)
Postnasal drip may be contributing to cough.  Consider role of dampness in home. Plan- refill azelastine. Labs for allergy markers.

## 2021-04-10 NOTE — Assessment & Plan Note (Signed)
Mild chronic cough Plan- refill benzonatate, CXR, consider CT

## 2021-04-10 NOTE — Patient Instructions (Addendum)
Order- CXR   dx chronic cough  Order- lab- CBC w diff, IgE    dx chronic cough  Ok to take Robitussin DM or Delsym cough syrup  Ok to take throat lozenges like Zero Sugar Marolyn Hammock Rancher candies ([employer], [employer])  Please call if we can help  Script sent for azelastine ( Astelin) nasal spray ([facility])  Refill script sent for benzonatate perles (Meds by mail)

## 2021-04-11 LAB — IGE: IgE (Immunoglobulin E), Serum: 15 kU/L (ref <=114)

## 2021-04-13 ENCOUNTER — Encounter: Payer: Self-pay | Admitting: *Deleted

## 2021-05-09 ENCOUNTER — Encounter: Payer: Self-pay | Admitting: Family Medicine

## 2021-05-09 ENCOUNTER — Telehealth (INDEPENDENT_AMBULATORY_CARE_PROVIDER_SITE_OTHER): Payer: Medicare Other | Admitting: Family Medicine

## 2021-05-09 VITALS — BP 147/67 | HR 68 | Temp 96.7°F | Wt 129.0 lb

## 2021-05-09 DIAGNOSIS — J4541 Moderate persistent asthma with (acute) exacerbation: Secondary | ICD-10-CM | POA: Diagnosis not present

## 2021-05-09 DIAGNOSIS — J479 Bronchiectasis, uncomplicated: Secondary | ICD-10-CM | POA: Diagnosis not present

## 2021-05-09 MED ORDER — AMOXICILLIN 500 MG PO CAPS: 500.0000 mg | ORAL_CAPSULE | Freq: Two times a day (BID) | ORAL | 0 refills | Status: AC

## 2021-05-09 MED ORDER — PREDNISONE 10 MG PO TABS
ORAL_TABLET | ORAL | 0 refills | Status: AC
Start: 2021-05-09 — End: 2021-05-17

## 2021-05-09 NOTE — Progress Notes (Signed)
I connected with Dannial Monarch on 05/09/21 at 12:20 PM EDT by video and verified that I am speaking with the correct person using two identifiers.   I discussed the limitations, risks, security and privacy concerns of performing an evaluation and management service by video and the availability of in person appointments. I also discussed with the patient that there may be a patient responsible charge related to this service. The patient expressed understanding and agreed to proceed.  Patient location: Home Provider Location: [place] Participants: Lesleigh Noe and Dannial Monarch   Subjective:     Samantha [name] is a 75 y.o. female presenting for Cough (Onset of sx: 05/04/21. Took home test this morning and was negative ), Nasal Congestion, and Sore Throat ("Irritated")     Cough This is a new problem. Episode onset: 05/04/2021. The cough is Productive of sputum. Associated symptoms include nasal congestion, postnasal drip, a sore throat and shortness of breath. Pertinent negatives include no chills, fever or headaches. She has tried a beta-agonist inhaler for the symptoms. Her past medical history is significant for asthma and bronchiectasis.  Sore Throat  Associated symptoms include coughing and shortness of breath. Pertinent negatives include no headaches.   Using singulair, nose spray and inhalers Taking mucinex   Review of Systems  Constitutional:  Negative for chills and fever.  HENT:  Positive for postnasal drip and sore throat.   Respiratory:  Positive for cough and shortness of breath.   Neurological:  Negative for headaches.    Social History   Tobacco Use  Smoking Status Never  Smokeless Tobacco Never        Objective:   BP Readings from Last 3 Encounters:  05/09/21 (!) 147/67  04/10/21 (!) 146/60  02/20/21 134/75   Wt Readings from Last 3 Encounters:  05/09/21 129 lb (58.5 kg)  04/10/21 132 lb 6.4 oz (60.1 kg)  02/20/21 133 lb 9.6 oz  (60.6 kg)   BP (!) 147/67   Pulse 68   Temp (!) 96.7 F (35.9 C) (Oral)   Wt 129 lb (58.5 kg)   BMI 23.59 kg/m   Physical Exam Constitutional:      Appearance: Normal appearance. She is not ill-appearing.  HENT:     Head: Normocephalic and atraumatic.     Comments: Hoarse sounding    Right Ear: External ear normal.     Left Ear: External ear normal.  Eyes:     Conjunctiva/sclera: Conjunctivae normal.  Pulmonary:     Effort: Pulmonary effort is normal. No respiratory distress.  Neurological:     Mental Status: She is alert. Mental status is at baseline.  Psychiatric:        Mood and Affect: Mood normal.        Behavior: Behavior normal.        Thought Content: Thought content normal.        Judgment: Judgment normal.          Assessment & Plan:   Problem List Items Addressed This Visit       Respiratory   Moderate persistent asthma with acute exacerbation - Primary   Relevant Medications   predniSONE (DELTASONE) 10 MG tablet   amoxicillin (AMOXIL) 500 MG capsule   Bronchiectasis without complication (HCC)   Relevant Medications   predniSONE (DELTASONE) 10 MG tablet   amoxicillin (AMOXIL) 500 MG capsule   Negative covid test Pt notes symptoms similar to prior flare ups of asthma/bronchiectasis  Will treat with abx and steroids which resolved symptoms in May 2022  F/u with pcp or pulm if not improving  Return if symptoms worsen or fail to improve.  Lesleigh Noe, MD

## 2021-05-16 ENCOUNTER — Telehealth: Payer: Self-pay | Admitting: Internal Medicine

## 2021-05-16 NOTE — Telephone Encounter (Signed)
Called and spoke with patient. She verbalized understanding. I was able to get her scheduled with CY for 05/19/21 at 930am.   Dr. Annamaria Boots, can you please advise? Thanks.

## 2021-05-16 NOTE — Telephone Encounter (Signed)
Ill send to Dr. Annamaria Boots, recommend in office visit with first available provider

## 2021-05-16 NOTE — Telephone Encounter (Signed)
Primary Pulmonologist: Dr. Annamaria Boots Last office visit and with whom: Dr. Annamaria Boots on 04/10/21 What do we see them for (pulmonary problems): Chronic cough, Bronciectasis Last OV assessment/plan: see below  Was appointment offered to patient (explain)?     Assessment & Plan Note by Deneise Lever, MD at 04/10/2021 11:30 AM  Author: Deneise Lever, MD Author Type: Physician Filed: 04/10/2021 11:31 AM  Note Status: Written Cosign: Cosign Not Required Encounter Date: 04/10/2021  Problem: Bronchiectasis without complication [facility])  Editor: Deneise Lever, MD (Physician)             Mild chronic cough Plan- refill benzonatate, CXR, consider CT         Assessment & Plan Note by Deneise Lever, MD at 04/10/2021 11:29 AM  Author: Deneise Lever, MD Author Type: Physician Filed: 04/10/2021 11:30 AM  Note Status: Written Cosign: Cosign Not Required Encounter Date: 04/10/2021  Problem: Seasonal and perennial allergic rhinitis  Editor: Deneise Lever, MD (Physician)             Postnasal drip may be contributing to cough.  Consider role of dampness in home. Plan- refill azelastine. Labs for allergy markers.        Order- CXR   dx chronic cough   Order- lab- CBC w diff, IgE    dx chronic cough   Ok to take Robitussin DM or Delsym cough syrup   Ok to take throat lozenges like Zero Sugar Marolyn Hammock Rancher candies ([employer], [employer])   Please call if we can help   Script sent for azelastine ( Astelin) nasal spray ([facility])   Refill script sent for benzonatate perles (Meds by mail)            Reason for call: I called and spoke with patient regarding message patient called and have been sick since August 25th. Patient called PCP and have video visit with on August 30th and was given Amoxicillin and pred paper. Finished Abx todat, will finish pred taper tomm. Feeling some better from better but still hoarse, coughing up thick mucous and has an "irritated" throat and thinks she can see white  spots on the back of her throat. No other symptoms, using Symbicort as prescribed, requesting rec. Will route to APP of the day as Dr. Annamaria Boots has left.  Beth, please advise. Thanks!  (examples of things to ask: : When did symptoms start? Fever? Cough? Productive? Color to sputum? More sputum than usual? Wheezing? Have you needed increased oxygen? Are you taking your respiratory medications? What over the counter measures have you tried?)  Allergies  Allergen Reactions   2,4-D Dimethylamine (Amisol)    Cefuroxime Axetil     REACTION: pt not sure of reaction- she can tolerate amoxil   Doxycycline Itching   Iodinated Diagnostic Agents Other (See Comments) and Itching   Latex Itching   Lisinopril     Cough   Lovastatin Other (See Comments)    Muscle aches    Other Other (See Comments)   Sulfonamide Derivatives     REACTION: pt not sure of reaction    Immunization History  Administered Date(s) Administered   Fluad Quad(high Dose 65+) 05/07/2019, 05/24/2020   Influenza Split 06/04/2011, 05/27/2012   Influenza Whole 08/18/2008, 05/26/2010   Influenza, High Dose Seasonal PF 05/22/2017, 06/11/2018   Influenza,inj,Quad PF,6+ Mos 06/08/2013, 05/19/2014, 06/08/2015, 05/07/2016   Influenza-Unspecified 05/17/2020   PFIZER(Purple Top)SARS-COV-2 Vaccination 10/27/2019, 11/17/2019, 10/11/2020   Pneumococcal Conjugate-13 03/17/2014   Pneumococcal Polysaccharide-23 11/23/2009, 06/08/2015,  07/14/2018   Td 11/09/2002   Tdap 07/03/2013   Zoster Recombinat (Shingrix) 02/19/2019, 08/27/2019   Zoster, Live 06/17/2013

## 2021-05-18 NOTE — Progress Notes (Signed)
Patient ID: Samantha [name], female    DOB: 16-Mar-1946, 75 y.o.   MRN: [id number]  HPI female never smoker followed for allergic rhinitis, asthma, bronchiectasis,  complicated by GERD/ LPR, Raynaud's, HBP, IBS, Glaucoma , Aortic Atherosclerosis,  Barium swallow 01/09/2016-normal PFT 06/05/2019- Mild restriction, normal flows and Diffusion --------------------------------------------------------------------------------------     04/10/21- 75 year old female never smoker followed for Allergic Rhinitis, Asthma, Bronchiectasis, complicated by GERD/ LPR, Raynaud's, HBP, IBS, Glaucoma -Albuterol hfa, azelastine nasal, Qnasl, Symbicort 160, singulair, Zyrtec, Protonix, Carafate, Covid vax-3 Phizer -----Sob, coughing w clear phlegm.  Nagging cough most of summer, scant white phlegm, no fever. Tussive point tenderness lower R midaxillary line- tender to pressure. No acute event or significant dyspnea. Worries about dampness in home. Post nasal drip- asks refill azelastine .Uses benzonatate only at night- makes her sleepy. CXR 08/30/20- IMPRESSION: Persistent nonspecific interstitial prominence.  No new findings.  05/19/21- 75 year old female never smoker followed for Allergic Rhinitis, Asthma, Bronchiectasis, complicated by GERD/ LPR, Raynaud's, HBP, IBS, Glaucoma -Albuterol hfa, azelastine nasal, Qnasl, Symbicort 160, singulair, Zyrtec, Protonix, -Carafate, Covid vax-3 Phizer Lab 04/10/21- EOS wnl, IgE 15 wnl Describes losing her voice again when she sat in hot church again, wearing mask and about to take her turn speaking. Sounds like a set-up for a reflux event. Had a video-telemedicine visit> amox and pred taper last week.  Has been hoarse and had seen white spots in throat.  Thick white sputum, no fever. Family hx sarcoid- discussed and will check ACE.  CXR 04/10/21-  IMPRESSION: No acute abnormalities. Minimal enlargement of cardiac silhouette and chronic interstitial prominence. Aortic  Atherosclerosis (ICD10-I70.0).   Review of Systems-see HPI   + = positive Constitutional:   No weight loss, night sweats,  Fevers, chills, fatigue, lassitude. HEENT:   No headaches,  Difficulty swallowing,  Tooth/dental problems,  Sore throat,                No sneezing, itching, or ear ache,   nasal congestion, post nasal drip, CV:  + chest pain, orthopnea, PND, swelling in lower extremities, anasarca, dizziness, palpitations GI  No heartburn, indigestion, abdominal pain, nausea, vomiting,  Resp: No shortness of breath with exertion or at rest.  No excess mucus, +productive cough,  +-non-productive cough,  No coughing up of blood.  No change in color of mucus.  +infrequent wheezing.   Skin: Clear GU:  MS:  No joint pain or swelling.   Psych:  No change in mood or affect. No depression or anxiety.  No memory loss.   Objective:   Physical Exam General- Alert, Oriented, Affect-appropriate, Distress- none acute; pleasant  Skin- clear Lymphadenopathy- none Head- atraumatic            Eyes- Gross vision intact, PERRLA, conjunctivae clear secretions,            Ears- Normal hearing            Nose- clear, no-Septal dev, mucus, polyps, erosion, perforation .                        Throat- Malampatti III.   TMs not  retracted , mucosa not red ? thrush, drainage- none, tonsils- atrophic;  +voice quality strained,  Neck- flexible , trachea midline, no stridor , thyroid nl, carotid no bruit Chest - symmetrical excursion , unlabored           Heart/CV- RRR , no murmur , no gallop  , no rub, nl  s1 s2                           - JVD- none , edema-none, stasis changes- none, varices- none           Lung-  wheeze or rhonchi-none, cough+ mild dry , dullness-none, rub- none, crackles- none           Chest wall-  Abd-  Br/ Gen/ Rectal- Not done, not indicated Extrem- cyanosis- none, clubbing, none, atrophy- none, strength- nl.  Neuro- grossly intact to observation

## 2021-05-19 ENCOUNTER — Ambulatory Visit (INDEPENDENT_AMBULATORY_CARE_PROVIDER_SITE_OTHER): Payer: Medicare Other | Admitting: Internal Medicine

## 2021-05-19 ENCOUNTER — Encounter: Payer: Self-pay | Admitting: Internal Medicine

## 2021-05-19 ENCOUNTER — Other Ambulatory Visit: Payer: Self-pay

## 2021-05-19 VITALS — BP 138/58 | HR 69 | Temp 97.9°F | Ht 62.0 in | Wt 132.6 lb

## 2021-05-19 DIAGNOSIS — D869 Sarcoidosis, unspecified: Secondary | ICD-10-CM | POA: Diagnosis not present

## 2021-05-19 DIAGNOSIS — J471 Bronchiectasis with (acute) exacerbation: Secondary | ICD-10-CM | POA: Diagnosis not present

## 2021-05-19 DIAGNOSIS — Z23 Encounter for immunization: Secondary | ICD-10-CM | POA: Diagnosis not present

## 2021-05-19 DIAGNOSIS — K219 Gastro-esophageal reflux disease without esophagitis: Secondary | ICD-10-CM | POA: Diagnosis not present

## 2021-05-19 DIAGNOSIS — R059 Cough, unspecified: Secondary | ICD-10-CM | POA: Diagnosis not present

## 2021-05-19 DIAGNOSIS — J479 Bronchiectasis, uncomplicated: Secondary | ICD-10-CM | POA: Diagnosis not present

## 2021-05-19 MED ORDER — FLUCONAZOLE 150 MG PO TABS: 150.0000 mg | ORAL_TABLET | Freq: Every day | ORAL | 0 refills | Status: DC

## 2021-05-19 NOTE — Patient Instructions (Addendum)
Script sent for diflucan to clear any yeast in your throat  Order- schedule CT scan of chest, no contrast    dx bronchiectasis  Order- sputum for AFB smear and culture     dx bronchiectasis  Order- lab- Angiotensin Converting Enzyme level    dx sarcoid  Order- senior flu vax

## 2021-05-19 NOTE — Assessment & Plan Note (Signed)
Emphasis on reflux precautions, use of her acidd blocker

## 2021-05-19 NOTE — Assessment & Plan Note (Signed)
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 Need to watch for atypical infection. Plan- update CT chest, Sputum for AFB, emphasize reflux precautions, Diflucan for possible thrush/ hoarseness, ACE level (she is concerned about family hx sarcoid)

## 2021-05-22 LAB — ANGIOTENSIN CONVERTING ENZYME: Angiotensin-Converting Enzyme: 17 U/L (ref 9–67)

## 2021-05-31 ENCOUNTER — Other Ambulatory Visit: Payer: Self-pay

## 2021-05-31 ENCOUNTER — Ambulatory Visit (INDEPENDENT_AMBULATORY_CARE_PROVIDER_SITE_OTHER)
Admission: RE | Admit: 2021-05-31 | Discharge: 2021-05-31 | Disposition: A | Discharge status: Home / Self Care | Payer: Medicare Other | Source: Ambulatory Visit | Attending: Internal Medicine | Admitting: Internal Medicine

## 2021-05-31 DIAGNOSIS — R059 Cough, unspecified: Secondary | ICD-10-CM

## 2021-05-31 DIAGNOSIS — J479 Bronchiectasis, uncomplicated: Secondary | ICD-10-CM

## 2021-05-31 DIAGNOSIS — I313 Pericardial effusion (noninflammatory): Secondary | ICD-10-CM | POA: Diagnosis not present

## 2021-05-31 DIAGNOSIS — I700 Atherosclerosis of aorta: Secondary | ICD-10-CM | POA: Diagnosis not present

## 2021-06-02 ENCOUNTER — Encounter: Payer: Self-pay | Admitting: *Deleted

## 2021-06-21 ENCOUNTER — Telehealth: Payer: Self-pay | Admitting: Rheumatology

## 2021-06-21 NOTE — Telephone Encounter (Signed)
Should be okay to use electric blanket at a lower setting.

## 2021-06-21 NOTE — Telephone Encounter (Signed)
Patient wants to know if it would be safe for her to use an electric blanket? Patient read somewhere that with Fibromyalgia you should not get too cold, or hot. Please call patient to advise.

## 2021-06-21 NOTE — Telephone Encounter (Signed)
Patient advised should be okay to use electric blanket at a lower setting.

## 2021-07-12 ENCOUNTER — Telehealth: Payer: Self-pay | Admitting: Internal Medicine

## 2021-07-12 MED ORDER — AZITHROMYCIN 250 MG PO TABS: ORAL_TABLET | ORAL | 0 refills | Status: DC

## 2021-07-12 NOTE — Telephone Encounter (Signed)
Call returned to patient, confirmed DOB. She reports developing throat irritation last week and a runny nose. She reports her face being sore. She thinks her sinus's are inflamed. Covid test was negative. Denies fever, sweats, body aches, or chills. Coughing up thick white mucous with no color. She was taking OTC Deslym which has helped some. She confirms she is using her Symbicort daily and albuterol as needed. Also using Q-nasal spray as well. She does report a sinus drainage that makes the cough worse. She reports her gas furnace went out and once she had it replaced she noticed a change in her breathing and sinus's.

## 2021-07-12 NOTE — Telephone Encounter (Signed)
Call returned to patient, made aware of CY recommendations. Voiced understanding. Confirmed pharmacy. Medication sent in.   Nothing further needed at this time.

## 2021-07-12 NOTE — Telephone Encounter (Signed)
We can try Zpak (250 mg, # 6, 2 today then one daily) that may have some anti-inflammatory effect on sinuses.  Otherwise, treat as a cold with throat lozenges, maybe something like Tylenol Cold and Flu or etc as needed.

## 2021-07-13 ENCOUNTER — Ambulatory Visit: Payer: Medicare Other

## 2021-07-17 ENCOUNTER — Other Ambulatory Visit: Payer: Medicare Other

## 2021-07-18 ENCOUNTER — Telehealth: Payer: Self-pay | Admitting: Internal Medicine

## 2021-07-18 DIAGNOSIS — J329 Chronic sinusitis, unspecified: Secondary | ICD-10-CM

## 2021-07-18 NOTE — Telephone Encounter (Signed)
Please order limited CT max/fac     dx chronic sinusitis  Amoxacillin 500 mg, # 14, 1 twice daily  Ask her to tell us in the future that Zpaks don't work well for her

## 2021-07-18 NOTE — Telephone Encounter (Signed)
Called and spoke with patient who is calling because her cough is better but the laryngitis is not better. She states that her cough is dry now but still sometimes coughs up something from her sinuses draining. Pt states her sinuses are still draining. Pt states she finished z pack and that she feels like it didn't do anything for her and that it really never does. She states that she is concerned as to why she keeps getting this and why it never completely goes away. She states that it goes away for about 3 weeks and then it comes right back. Advised patient that Dr. Annamaria Boots has already left for the day and that she would get a call back tomorrow and she said that was ok.   Dr. Annamaria Boots please advise

## 2021-07-19 MED ORDER — AMOXICILLIN 500 MG PO CAPS
500.0000 mg | ORAL_CAPSULE | Freq: Two times a day (BID) | ORAL | 0 refills | Status: DC
Start: 2021-07-19 — End: 2021-11-07

## 2021-07-19 NOTE — Telephone Encounter (Signed)
I have called the pt and she is aware of CY recs.  Amox has been sent to the pharmacy per her request and the ct is ordered.  Pt is aware.

## 2021-07-21 ENCOUNTER — Encounter: Payer: Medicare Other | Admitting: Family Medicine

## 2021-07-21 NOTE — Progress Notes (Signed)
Patient ID: Samantha [name], female    DOB: 11-22-1945, 75 y.o.   MRN: [id number]  HPI female never smoker followed for allergic rhinitis, asthma, bronchiectasis,  complicated by GERD/ LPR, Raynaud's, HBP, IBS, Glaucoma , Aortic Atherosclerosis,  Barium swallow 01/09/2016-normal PFT 06/05/2019- Mild restriction, normal flows and Diffusion --------------------------------------------------------------------------------------     05/19/21- 75 year old female never smoker followed for Allergic Rhinitis, Asthma, Bronchiectasis, complicated by GERD/ LPR, Raynaud's, HBP, IBS, Glaucoma -Albuterol hfa, azelastine nasal, Qnasl, Symbicort 160, singulair, Zyrtec, Protonix, -Carafate, Covid vax-3 Phizer Lab 04/10/21- EOS wnl, IgE 15 wnl Describes losing her voice again when she sat in hot church again, wearing mask and about to take her turn speaking. Sounds like a set-up for a reflux event. Had a video-telemedicine visit> amox and pred taper last week.  Has been hoarse and had seen white spots in throat.  Thick white sputum, no fever. Family hx sarcoid- discussed and will check ACE.  CXR 04/10/21-  IMPRESSION: No acute abnormalities. Minimal enlargement of cardiac silhouette and chronic interstitial prominence. Aortic Atherosclerosis (ICD10-I70.0).  07/24/21- 75 year old female never smoker followed for Allergic Rhinitis, Asthma, Bronchiectasis, complicated by GERD/ LPR, Raynaud's, HBP, IBS, Glaucoma -Albuterol hfa, azelastine nasal, Qnasl, Symbicort 160, singulair, Zyrtec, Protonix, -Carafate, Covid vax-3 Phizer Flu vax-had Lab 04/10/21- EOS wnl, IgE 15 wnl Pending CT max/fac for 11/23                           ? ENT eval ACE 05/19/21- 17 WNL IgE 8/1   15 WNL      EOS 0.6  WNL Zpak sent 11/2- finished  Now on amoxacillin and rec otc cold remedies. She complains of waxing and waning nonspecific symptoms these include hoarseness, itching and burning in the left eye, irritated throat, some cough which is  occasionally productive of clear sputum, head congestion, postnasal drainage.  Had [place] in 2020 though tested negative.  Reports a pneumonia last December.  Says she has not felt well enough to take a COVID booster.  ENT evaluation by Dr. Wilburn Cornelia a year or so ago.  She is not aware of reflux but sleeps with head elevated to avoid stomach gurgling noise.  Had allergy testing by Dr. Neldon Mc a few years ago.  Sensitive to odors and has tried Nasalcrom.  Some shortness of breath and tiredness. CT chest 05/31/21-  IMPRESSION: 1. Minimal lower lobe bronchiectasis and scarring without acute airspace disease or airways thickening. 2. Borderline to mild cardiomegaly with small pericardial effusion Aortic Atherosclerosis (ICD10-I70.0).  Review of Systems-see HPI   + = positive Constitutional:   No weight loss, night sweats,  Fevers, chills, fatigue, lassitude. HEENT:   No headaches,  Difficulty swallowing,  Tooth/dental problems,  Sore throat,                No sneezing, itching, or ear ache,   nasal congestion, post nasal drip, CV:  + chest pain, orthopnea, PND, swelling in lower extremities, anasarca, dizziness, palpitations GI  No heartburn, indigestion, abdominal pain, nausea, vomiting,  Resp: No shortness of breath with exertion or at rest.  No excess mucus, +productive cough,  +-non-productive cough,  No coughing up of blood.  No change in color of mucus.  +infrequent wheezing.   Skin: Clear GU:  MS:  No joint pain or swelling.   Psych:  No change in mood or affect. No depression or anxiety.  No memory loss.   Objective:   Physical Exam  General- Alert, Oriented, Affect-appropriate, Distress- none acute; pleasant  Skin- clear Lymphadenopathy- none Head- atraumatic            Eyes- Gross vision intact, PERRLA, conjunctivae clear secretions,            Ears- Normal hearing            Nose- clear, no-Septal dev, mucus, polyps, erosion, perforation .                        Throat- Malampatti  III.   TMs not  retracted , mucosa not red ? thrush, drainage- none, tonsils- atrophic;  +voice quality strained,  Neck- flexible , trachea midline, no stridor , thyroid nl, carotid no bruit Chest - symmetrical excursion , unlabored           Heart/CV- RRR , no murmur , no gallop  , no rub, nl s1 s2                           - JVD- none , edema-none, stasis changes- none, varices- none           Lung-  wheeze or rhonchi-none, cough+ mild dry , dullness-none, rub- none, crackles- none           Chest wall-  Abd-  Br/ Gen/ Rectal- Not done, not indicated Extrem- cyanosis- none, clubbing, none, atrophy- none, strength- nl.  Neuro- grossly intact to observation

## 2021-07-24 ENCOUNTER — Ambulatory Visit (INDEPENDENT_AMBULATORY_CARE_PROVIDER_SITE_OTHER): Payer: Medicare Other | Admitting: Internal Medicine

## 2021-07-24 ENCOUNTER — Encounter: Payer: Self-pay | Admitting: Internal Medicine

## 2021-07-24 ENCOUNTER — Other Ambulatory Visit: Payer: Self-pay

## 2021-07-24 VITALS — BP 128/58 | HR 66 | Temp 97.6°F | Ht 62.0 in | Wt 136.0 lb

## 2021-07-24 DIAGNOSIS — J302 Other seasonal allergic rhinitis: Secondary | ICD-10-CM

## 2021-07-24 DIAGNOSIS — J471 Bronchiectasis with (acute) exacerbation: Secondary | ICD-10-CM

## 2021-07-24 DIAGNOSIS — J3089 Other allergic rhinitis: Secondary | ICD-10-CM

## 2021-07-24 DIAGNOSIS — J479 Bronchiectasis, uncomplicated: Secondary | ICD-10-CM

## 2021-07-24 LAB — SEDIMENTATION RATE: Sed Rate: 7 mm/hr (ref 0–30)

## 2021-07-24 LAB — C-REACTIVE PROTEIN: CRP: <1 mg/dL (ref 0.5–20.0)

## 2021-07-24 MED ORDER — ALBUTEROL SULFATE (2.5 MG/3ML) 0.083% IN NEBU
2.5000 mg | INHALATION_SOLUTION | Freq: Four times a day (QID) | RESPIRATORY_TRACT | 1 refills | Status: DC | PRN
Start: 2021-07-24 — End: 2021-12-19

## 2021-07-24 NOTE — Patient Instructions (Signed)
Order- new DME new compressor nebulizer                             Albuterol neb solution 75 ml 1 neb every 6 hours if needed for chest congestion  Finish amoxacillin  Order- lab- Sed rate, ANA, CRP     dx bronchiectasis

## 2021-07-25 DIAGNOSIS — J471 Bronchiectasis with (acute) exacerbation: Secondary | ICD-10-CM | POA: Diagnosis not present

## 2021-07-25 LAB — ANA: Anti Nuclear Antibody (ANA): NEGATIVE

## 2021-07-26 ENCOUNTER — Encounter: Payer: Self-pay | Admitting: *Deleted

## 2021-08-02 ENCOUNTER — Ambulatory Visit (INDEPENDENT_AMBULATORY_CARE_PROVIDER_SITE_OTHER)
Admission: RE | Admit: 2021-08-02 | Discharge: 2021-08-02 | Disposition: A | Discharge status: Home / Self Care | Payer: Medicare Other | Source: Ambulatory Visit | Attending: Internal Medicine | Admitting: Internal Medicine

## 2021-08-02 ENCOUNTER — Other Ambulatory Visit: Payer: Self-pay

## 2021-08-02 DIAGNOSIS — Z803 Family history of malignant neoplasm of breast: Secondary | ICD-10-CM | POA: Diagnosis not present

## 2021-08-02 DIAGNOSIS — Z806 Family history of leukemia: Secondary | ICD-10-CM | POA: Diagnosis not present

## 2021-08-02 DIAGNOSIS — J329 Chronic sinusitis, unspecified: Secondary | ICD-10-CM

## 2021-08-02 DIAGNOSIS — Z800 Family history of malignant neoplasm of digestive organs: Secondary | ICD-10-CM | POA: Diagnosis not present

## 2021-08-07 ENCOUNTER — Encounter: Payer: Self-pay | Admitting: *Deleted

## 2021-08-14 ENCOUNTER — Encounter: Payer: Self-pay | Admitting: Internal Medicine

## 2021-08-14 NOTE — Assessment & Plan Note (Signed)
Discussed options.  Try Nasalcrom

## 2021-08-14 NOTE — Assessment & Plan Note (Addendum)
Previously noted mild bronchiectasis may explain some symptoms.  Have felt there is a nonspecific, nonallopathic, anxiety component.  May tie into previous diagnosis of fibromyalgia. Plan-nebulizer solution, albutero, finish l amoxicillin, labs for inflammatory markers

## 2021-08-28 ENCOUNTER — Other Ambulatory Visit: Payer: Self-pay | Admitting: Family Medicine

## 2021-08-28 DIAGNOSIS — E559 Vitamin D deficiency, unspecified: Secondary | ICD-10-CM

## 2021-08-28 DIAGNOSIS — M8580 Other specified disorders of bone density and structure, unspecified site: Secondary | ICD-10-CM

## 2021-08-28 DIAGNOSIS — I10 Essential (primary) hypertension: Secondary | ICD-10-CM

## 2021-09-07 ENCOUNTER — Other Ambulatory Visit: Payer: Medicare Other

## 2021-09-12 ENCOUNTER — Encounter: Payer: Medicare Other | Admitting: Family Medicine

## 2021-09-13 ENCOUNTER — Telehealth: Payer: Self-pay | Admitting: Internal Medicine

## 2021-09-13 MED ORDER — BUDESONIDE-FORMOTEROL FUMARATE 160-4.5 MCG/ACT IN AERO: 2.0000 | INHALATION_SPRAY | Freq: Two times a day (BID) | RESPIRATORY_TRACT | 4 refills | Status: DC

## 2021-09-13 NOTE — Telephone Encounter (Signed)
I called the patient to verify that she would need Symbicort. She reports that she will keep this refill and she has a follow up in May with Dr. Annamaria Boots.

## 2021-09-18 DIAGNOSIS — Z809 Family history of malignant neoplasm, unspecified: Secondary | ICD-10-CM | POA: Diagnosis not present

## 2021-09-18 DIAGNOSIS — Z803 Family history of malignant neoplasm of breast: Secondary | ICD-10-CM | POA: Diagnosis not present

## 2021-09-18 DIAGNOSIS — Z9189 Other specified personal risk factors, not elsewhere classified: Secondary | ICD-10-CM | POA: Diagnosis not present

## 2021-10-03 DIAGNOSIS — H40003 Preglaucoma, unspecified, bilateral: Secondary | ICD-10-CM | POA: Diagnosis not present

## 2021-10-20 ENCOUNTER — Other Ambulatory Visit: Payer: Self-pay

## 2021-10-20 ENCOUNTER — Other Ambulatory Visit (INDEPENDENT_AMBULATORY_CARE_PROVIDER_SITE_OTHER): Payer: Medicare Other

## 2021-10-20 DIAGNOSIS — I10 Essential (primary) hypertension: Secondary | ICD-10-CM | POA: Diagnosis not present

## 2021-10-20 DIAGNOSIS — M8580 Other specified disorders of bone density and structure, unspecified site: Secondary | ICD-10-CM | POA: Diagnosis not present

## 2021-10-20 DIAGNOSIS — E559 Vitamin D deficiency, unspecified: Secondary | ICD-10-CM

## 2021-10-20 LAB — COMPREHENSIVE METABOLIC PANEL
ALT: 10 U/L (ref 0–35)
AST: 19 U/L (ref 0–37)
Albumin: 4.3 g/dL (ref 3.5–5.2)
Alkaline Phosphatase: 61 U/L (ref 39–117)
BUN: 14 mg/dL (ref 6–23)
CO2: 34 mEq/L — ABNORMAL HIGH (ref 19–32)
Calcium: 10.2 mg/dL (ref 8.4–10.5)
Chloride: 102 mEq/L (ref 96–112)
Creatinine, Ser: 0.9 mg/dL (ref 0.40–1.20)
GFR: 62.24 mL/min (ref >60.00)
Glucose, Bld: 98 mg/dL (ref 70–99)
Potassium: 4.2 mEq/L (ref 3.5–5.1)
Sodium: 140 mEq/L (ref 135–145)
Total Bilirubin: 0.5 mg/dL (ref 0.2–1.2)
Total Protein: 7 g/dL (ref 6.0–8.3)

## 2021-10-20 LAB — CBC WITH DIFFERENTIAL/PLATELET
Basophils Absolute: 0 10*3/uL (ref 0.0–0.1)
Basophils Relative: 0.5 % (ref 0.0–3.0)
Eosinophils Absolute: 0.1 10*3/uL (ref 0.0–0.7)
Eosinophils Relative: 1.3 % (ref 0.0–5.0)
HCT: 38.2 % (ref 36.0–46.0)
Hemoglobin: 13 g/dL (ref 12.0–15.0)
Lymphocytes Relative: 41.8 % (ref 12.0–46.0)
Lymphs Abs: 2.1 10*3/uL (ref 0.7–4.0)
MCHC: 34.2 g/dL (ref 30.0–36.0)
MCV: 84.7 fl (ref 78.0–100.0)
Monocytes Absolute: 0.3 10*3/uL (ref 0.1–1.0)
Monocytes Relative: 6.8 % (ref 3.0–12.0)
Neutro Abs: 2.5 10*3/uL (ref 1.4–7.7)
Neutrophils Relative %: 49.6 % (ref 43.0–77.0)
Platelets: 188 10*3/uL (ref 150.0–400.0)
RBC: 4.51 Mil/uL (ref 3.87–5.11)
RDW: 13.6 % (ref 11.5–15.5)
WBC: 5 10*3/uL (ref 4.0–10.5)

## 2021-10-20 LAB — VITAMIN D 25 HYDROXY (VIT D DEFICIENCY, FRACTURES): VITD: 49.87 ng/mL (ref 30.00–100.00)

## 2021-10-20 LAB — LIPID PANEL
Cholesterol: 223 mg/dL — ABNORMAL HIGH (ref 0–200)
HDL: 104.5 mg/dL (ref >39.00)
LDL Cholesterol: 100 mg/dL — ABNORMAL HIGH (ref 0–99)
NonHDL: 118.21
Total CHOL/HDL Ratio: 2
Triglycerides: 90 mg/dL (ref 0.0–149.0)
VLDL: 18 mg/dL (ref 0.0–40.0)

## 2021-10-20 LAB — TSH: TSH: 3.77 u[IU]/mL (ref 0.35–5.50)

## 2021-10-23 DIAGNOSIS — R1909 Other intra-abdominal and pelvic swelling, mass and lump: Secondary | ICD-10-CM | POA: Diagnosis not present

## 2021-10-23 DIAGNOSIS — Z803 Family history of malignant neoplasm of breast: Secondary | ICD-10-CM | POA: Diagnosis not present

## 2021-10-27 ENCOUNTER — Encounter: Payer: Medicare Other | Admitting: Family Medicine

## 2021-11-07 ENCOUNTER — Encounter: Payer: Self-pay | Admitting: Family Medicine

## 2021-11-07 ENCOUNTER — Other Ambulatory Visit: Payer: Self-pay

## 2021-11-07 ENCOUNTER — Ambulatory Visit (INDEPENDENT_AMBULATORY_CARE_PROVIDER_SITE_OTHER): Payer: Medicare Other | Admitting: Family Medicine

## 2021-11-07 VITALS — BP 154/68 | HR 91 | Temp 97.9°F | Ht 62.0 in | Wt 135.1 lb

## 2021-11-07 DIAGNOSIS — J0100 Acute maxillary sinusitis, unspecified: Secondary | ICD-10-CM

## 2021-11-07 MED ORDER — AMOXICILLIN 500 MG PO CAPS: 500.0000 mg | ORAL_CAPSULE | Freq: Three times a day (TID) | ORAL | 0 refills | Status: AC

## 2021-11-07 NOTE — Progress Notes (Signed)
Subjective:    Patient ID: Samantha [name], female    DOB: 06-12-1946, 76 y.o.   MRN: [id number]  This visit occurred during the SARS-CoV-2 public health emergency.  Safety protocols were in place, including screening questions prior to the visit, additional usage of staff PPE, and extensive cleaning of exam room while observing appropriate contact time as indicated for disinfecting solutions.   HPI 76 yo pt of Dr Damita Dunnings presents with nasal congestion and cough  She has a h/o bronchiectasis  Had a CT of sinuses in nov that was nl   Wt Readings from Last 3 Encounters:  11/07/21 135 lb 2 oz (61.3 kg)  07/24/21 136 lb (61.7 kg)  05/19/21 132 lb 9.6 oz (60.1 kg)   24.71 kg/m  Symptoms for 2 weeks- covid test is negative  Started with headache and runny nose  Now some chest congestion  Hoarse  Nasal congestion and sinus congestion   Yellow mucous  Blood from nose    Not wheezing (little last week but not now) A little chest tightness Prod cough- thick white phlegm No fever  No chills or body aches    Now some pain in her cheeks/sinuses  Nasal passages are swollen   Tried steam for congestion   Nasal crom -relatively new  Used to use Q nasal  Symbicort  Albuterol prn (also neb machine)  Tessalon -no longer using  Now delsym for cough   Patient Active Problem List   Diagnosis Date Noted   RUQ pain 01/08/2021   Fatigue 01/08/2021   DNR (do not resuscitate) 07/24/2020   Tinea versicolor 05/30/2020   Shoulder pain 12/24/2019   Myalgia 12/24/2019   Bronchiectasis without complication ([place]) 78/58/8502   Dysphagia 07/23/2019   Abdominal pain 07/23/2019   Nonintractable headache 03/19/2019   Paresthesia 03/19/2019   Decreased grip strength 02/04/2019   Itching 02/02/2019   Local superficial swelling 02/02/2019   Weight loss 02/02/2019   Osteopenia 08/09/2018   Chronic rhinitis 07/23/2018   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   Healthcare  maintenance 07/16/2018   Moderate persistent asthma with acute exacerbation 01/30/2018   Advance care planning 01/02/2018   Vitamin D deficiency 07/08/2017   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/19/2017   Primary osteoarthritis of both knees 01/19/2017   Primary osteoarthritis of both feet 01/19/2017   Deviated septum 12/07/2016   Cough 11/14/2016   SOB (shortness of breath) 11/14/2016   Diverticulosis 03/19/2016   Seasonal and perennial allergic rhinitis 01/18/2016   Acute sinusitis 09/07/2015   Lumbar compression fracture ([place]) 06/20/2015   Fibromyalgia 02/26/2013   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   IRRITABLE BOWEL SYNDROME 04/06/2010   THYROID NODULE, RIGHT 12/07/2009   OSTEOARTHRITIS 11/28/2009   FIBROIDS, UTERUS 05/10/2008   Insomnia 05/10/2008   ASYMPTOMATIC POSTMENOPAUSAL STATUS 05/10/2008   Essential hypertension 01/28/2008   HYPERCHOLESTEROLEMIA 01/07/2008   GLAUCOMA 01/07/2008   Leg pain 08/29/2007   Gastroesophageal reflux disease 04/14/2007   Asthma 04/14/2007   Past Medical History:  Diagnosis Date   ALLERGIC RHINITIS 07/26/2008   ASTHMA 04/14/2007   Asthma    ASYMPTOMATIC POSTMENOPAUSAL STATUS 05/10/2008   Bronchiectasis ([place])    CHEST PAIN 08/26/2008   COLONIC POLYPS, HX OF 04/14/2007   colonic leiomyoma   Decreased grip strength    Episcleritis    FIBROIDS, UTERUS 05/10/2008   Fibromyalgia    Gait abnormality    GERD 04/14/2007   HYPERCHOLESTEROLEMIA 01/07/2008   HYPERGLYCEMIA 11/28/2009  HYPERTENSION 01/28/2008   INSOMNIA 05/10/2008   Irritable bowel syndrome 04/06/2010   OSTEOARTHRITIS 11/28/2009   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   SINUSITIS 02/10/2009   Past Surgical History:  Procedure Laterality Date   COLONOSCOPY W/ POLYPECTOMY     ELECTROCARDIOGRAM  12/04/2006   IR RADIOLOGY PERIPHERAL GUIDED IV START  04/21/2018   IR US GUIDE VASC ACCESS LEFT  04/21/2018   NASAL SEPTUM SURGERY     Stress Cardiolite  02/13/2002   Social  History   Tobacco Use   Smoking status: Never   Smokeless tobacco: Never  Vaping Use   Vaping Use: Never used  Substance Use Topics   Alcohol use: No   Drug use: No   Family History  Problem Relation Age of Onset   Diabetes Mother    Hypertension Mother    Glaucoma Mother    Polymyositis Mother    Heart disease Father        CHF   Allergic rhinitis Father    Asthma Father    Other Father        sideoblastic anemia   Allergic rhinitis Sister    Asthma Sister    Allergic rhinitis Brother    Asthma Brother    Prostate cancer Brother    Allergic rhinitis Maternal Aunt    Asthma Maternal Aunt    Allergic rhinitis Paternal Aunt    Asthma Paternal Aunt    Breast cancer Cousin    Colon cancer Cousin    Cancer Neg Hx        No FH of Colon Cancer   Angioedema Neg Hx    Atopy Neg Hx    Immunodeficiency Neg Hx    Urticaria Neg Hx    Eczema Neg Hx    Allergies  Allergen Reactions   2,4-D Dimethylamine    Cefuroxime Axetil     REACTION: pt not sure of reaction- she can tolerate amoxil   Doxycycline Itching   Iodinated Contrast Media Other (See Comments) and Itching   Latex Itching   Lisinopril     Cough   Lovastatin Other (See Comments)    Muscle aches    Other Other (See Comments)   Sulfonamide Derivatives     REACTION: pt not sure of reaction   Current Outpatient Medications on File Prior to Visit  Medication Sig Dispense Refill   albuterol (PROVENTIL) (2.5 MG/3ML) 0.083% nebulizer solution Take 3 mLs (2.5 mg total) by nebulization every 6 (six) hours as needed for wheezing or shortness of breath. 75 mL 1   albuterol (VENTOLIN HFA) 108 (90 Base) MCG/ACT inhaler Inhale 2 puffs into the lungs every 4 (four) hours as needed for wheezing or shortness of breath. 18 g 3   Azelastine HCl 0.15 % SOLN Place 2 sprays into both nostrils 2 (two) times daily as needed (runny nose). 30 mL 12   benzonatate (TESSALON) 200 MG capsule Take 1 capsule (200 mg total) by mouth 3 (three)  times daily as needed for cough. 90 capsule 4   bifidobacterium infantis (ALIGN) capsule Take 1 capsule by mouth daily.     budesonide-formoterol (SYMBICORT) 160-4.5 MCG/ACT inhaler Inhale 2 puffs into the lungs 2 (two) times daily. Rinse mouth 3 each 4   cetirizine (ZYRTEC) 10 MG tablet Take 10 mg by mouth at bedtime.      Cholecalciferol (VITAMIN D) 50 MCG (2000 UT) CAPS Take 1 capsule by mouth daily.     Coenzyme Q10 (CO Q 10) 60 MG CAPS 1 capsule  with a meal     cromolyn (NASALCROM) 5.2 MG/ACT nasal spray Place 1 spray into both nostrils 2 (two) times daily as needed for allergies.     losartan-hydrochlorothiazide (HYZAAR) 100-12.5 MG tablet Take 1 tablet by mouth daily. 90 tablet 3   Magnesium Malate POWD (1250mg  tabs) 1 tablet by mouth qhs     Manganese 50 MG TABS Take 50 mg by mouth at bedtime.      montelukast (SINGULAIR) 10 MG tablet Take 1 tablet (10 mg total) by mouth at bedtime. 90 tablet 3   Olopatadine HCl (PATADAY) 0.2 % SOLN Place 1 drop into both eyes daily as needed (itchy eyes).     pantoprazole (PROTONIX) 40 MG tablet Take 1 tablet (40 mg total) by mouth 2 (two) times daily. 180 tablet 3   Polyethyl Glycol-Propyl Glycol (SYSTANE) 0.4-0.3 % GEL ophthalmic gel Place 1 application into both eyes every 6 (six) hours as needed (dry eyes).     Pseudoephedrine-Guaifenesin [id number] MG TB12 Take 1,200 mg by mouth as needed.     TART CHERRY PO Take by mouth. Take 1 tablet one time a day by mouth     thiamine (VITAMIN B-1) 50 MG tablet Take 100 mg by mouth daily.     TURMERIC PO Take 1 tablet by mouth daily.      No current facility-administered medications on file prior to visit.     Review of Systems  Constitutional:  Negative for activity change, appetite change, fatigue, fever and unexpected weight change.  HENT:  Positive for congestion, sinus pressure, sinus pain and voice change. Negative for ear pain, rhinorrhea and sore throat.   Eyes:  Negative for pain, redness and visual  disturbance.  Respiratory:  Positive for cough. Negative for shortness of breath, wheezing and stridor.   Cardiovascular:  Negative for chest pain and palpitations.  Gastrointestinal:  Negative for abdominal pain, blood in stool, constipation and diarrhea.  Endocrine: Negative for polydipsia and polyuria.  Genitourinary:  Negative for dysuria, frequency and urgency.  Musculoskeletal:  Negative for arthralgias, back pain and myalgias.  Skin:  Negative for pallor and rash.  Allergic/Immunologic: Negative for environmental allergies.  Neurological:  Positive for headaches. Negative for dizziness and syncope.  Hematological:  Negative for adenopathy. Does not bruise/bleed easily.  Psychiatric/Behavioral:  Negative for decreased concentration and dysphoric mood. The patient is not nervous/anxious.       Objective:   Physical Exam Constitutional:      General: She is not in acute distress.    Appearance: Normal appearance. She is well-developed and normal weight. She is not ill-appearing or diaphoretic.     Comments: Not sob  HENT:     Head: Normocephalic and atraumatic.     Comments: Nares are injected and congested  Clear pnd   Bilateral maxillary sinus tenderness     Right Ear: Tympanic membrane, ear canal and external ear normal.     Left Ear: Tympanic membrane, ear canal and external ear normal.     Ears:     Comments: Partial cerumen imp bilat     Nose: Congestion and rhinorrhea present.     Mouth/Throat:     Mouth: Mucous membranes are moist.     Pharynx: No oropharyngeal exudate or posterior oropharyngeal erythema.     Comments: Clear pnd  Voice sounds mildly hoarse  Eyes:     General: No scleral icterus.       Right eye: No discharge.  Left eye: No discharge.     Conjunctiva/sclera: Conjunctivae normal.     Pupils: Pupils are equal, round, and reactive to light.  Cardiovascular:     Rate and Rhythm: Regular rhythm. Tachycardia present.  Pulmonary:     Effort:  Pulmonary effort is normal. No respiratory distress.     Breath sounds: Normal breath sounds. No stridor. No wheezing, rhonchi or rales.  Musculoskeletal:     Cervical back: Normal range of motion and neck supple. No tenderness.  Lymphadenopathy:     Cervical: No cervical adenopathy.  Skin:    General: Skin is warm and dry.     Findings: No erythema or rash.  Neurological:     Mental Status: She is alert.     Cranial Nerves: No cranial nerve deficit.  Psychiatric:        Mood and Affect: Mood normal.     Comments: Mildly anxious           Assessment & Plan:   Problem List Items Addressed This Visit       Respiratory   Acute sinusitis - Primary    Uri symptoms for 2 weeks  with h/o chronic bronchiectasis, neg covid test  Now sinus pain and pressure and more purulent drainage  No wheezing on exam/reassuring and good 02 sat  tx acute sinusitis with amoxicillin (has done well with in the past)  Rev old records today Discussed symptom care -continue the delsym and steam and chronic medications  Update if not starting to improve in a week or if worsening   F/u with pulmonary prn if cough does not improve Meds ordered this encounter  Medications   amoxicillin (AMOXIL) 500 MG capsule    Sig: Take 1 capsule (500 mg total) by mouth 3 (three) times daily for 10 days.    Dispense:  30 capsule    Refill:  0    Watch for wheeze and sob       Relevant Medications   cromolyn (NASALCROM) 5.2 MG/ACT nasal spray   amoxicillin (AMOXIL) 500 MG capsule

## 2021-11-07 NOTE — Patient Instructions (Signed)
For sinus infection take the amoxicillin   If congestion or facial pain become worse let us know  I would not use prednisone unless symptoms are severe  Update if not starting to improve in a week or if worsening    Rest your voice-it will take a while to come back  Drink fluids Keep treating your symptoms  Continue current medicines  Use steam Delsym is good for congestion   Watch for wheezing  Watch for fever or shortness of breath  Let Dr Annamaria Boots know also

## 2021-11-07 NOTE — Assessment & Plan Note (Signed)
Uri symptoms for 2 weeks  with h/o chronic bronchiectasis, neg covid test  Now sinus pain and pressure and more purulent drainage  No wheezing on exam/reassuring and good 02 sat  tx acute sinusitis with amoxicillin (has done well with in the past)  Rev old records today Discussed symptom care -continue the delsym and steam and chronic medications  Update if not starting to improve in a week or if worsening   F/u with pulmonary prn if cough does not improve Meds ordered this encounter  Medications   amoxicillin (AMOXIL) 500 MG capsule    Sig: Take 1 capsule (500 mg total) by mouth 3 (three) times daily for 10 days.    Dispense:  30 capsule    Refill:  0    Watch for wheeze and sob

## 2021-11-09 ENCOUNTER — Ambulatory Visit: Payer: Medicare Other | Admitting: Family Medicine

## 2021-11-13 ENCOUNTER — Telehealth: Payer: Self-pay | Admitting: Family Medicine

## 2021-11-13 NOTE — Telephone Encounter (Signed)
Spoke with patient and she was given amoxicillin '500mg'$  caps take TID x 10 days; she will finish this Thursday. Patient states she is no better; still very hoarse, congested and still has a cough. Patient states sx aren't as bad except the hoarseness. Patient wanted to know if there was anything else she needs to do to get better? ?

## 2021-11-13 NOTE — Telephone Encounter (Signed)
Pt called stating that she seen Dr Glori Bickers on 11/07/21 for a sinus infection. Pt stated that Dr Glori Bickers stated that if she doesn't feel any better to call Dr Damita Dunnings. Pt states that she is not feeling any better. Please advise. ?

## 2021-11-14 NOTE — Telephone Encounter (Signed)
Spoke with patient and advised on message below. Patient verbalized understanding and will call back if no better after finishing the abx. ?

## 2021-11-14 NOTE — Telephone Encounter (Signed)
If the symptoms are not as bad (except for the hoarseness), then I would finish the antibiotics.  A hoarse voice is often the last symptom to resolve.  Try to rest her voice is much as possible in the meantime.  Gargle with salt water.  If she continues to have discolored nasal discharge and facial pain in spite of finishing the antibiotics then let me know.  I would still expect her to gradually improve, especially since she has not yet done with her antibiotic course.  Thanks. ?

## 2021-12-01 ENCOUNTER — Ambulatory Visit (INDEPENDENT_AMBULATORY_CARE_PROVIDER_SITE_OTHER): Payer: Medicare Other | Admitting: Family Medicine

## 2021-12-01 ENCOUNTER — Other Ambulatory Visit: Payer: Self-pay

## 2021-12-01 ENCOUNTER — Encounter: Payer: Self-pay | Admitting: Family Medicine

## 2021-12-01 VITALS — BP 118/52 | HR 72 | Temp 97.9°F | Ht 62.0 in | Wt 134.0 lb

## 2021-12-01 DIAGNOSIS — J479 Bronchiectasis, uncomplicated: Secondary | ICD-10-CM

## 2021-12-01 DIAGNOSIS — Z7189 Other specified counseling: Secondary | ICD-10-CM

## 2021-12-01 DIAGNOSIS — Z0000 Encounter for general adult medical examination without abnormal findings: Secondary | ICD-10-CM | POA: Diagnosis not present

## 2021-12-01 DIAGNOSIS — I10 Essential (primary) hypertension: Secondary | ICD-10-CM | POA: Diagnosis not present

## 2021-12-01 MED ORDER — LOSARTAN POTASSIUM-HCTZ 100-12.5 MG PO TABS: 0.5000 | ORAL_TABLET | Freq: Every day | ORAL | 3 refills | Status: DC

## 2021-12-01 NOTE — Patient Instructions (Addendum)
Let me check with pulmonary about med options.  ?Cut your BP medicine in half and let me know how you feel in about 10 days (lightheaded, fatigue, etc).  ?See if you notice a pattern to your foot pain.  If so, then let me know.  ?You can use plain vaseline or hydrocortisone lotion on the spot on your arm. ?Take care.  Glad to see you. ?

## 2021-12-01 NOTE — Progress Notes (Signed)
This visit occurred during the SARS-CoV-2 public health emergency.  Safety protocols were in place, including screening questions prior to the visit, additional usage of staff PPE, and extensive cleaning of exam room while observing appropriate contact time as indicated for disinfecting solutions. ? ?I have personally reviewed the Medicare Annual Wellness questionnaire and have noted ?1. The patient's medical and social history ?2. Their use of alcohol, tobacco or illicit drugs ?3. Their current medications and supplements ?4. The patient's functional ability including ADL's, fall risks, home safety risks and hearing or visual ?            impairment. ?5. Diet and physical activities ?6. Evidence for depression or mood disorders ? ?The patients weight, height, BMI have been recorded in the chart and visual acuity is per eye clinic.  ?I have made referrals, counseling and provided education to the patient based review of the above and I have provided the pt with a written personalized care plan for preventive services. ? ?Provider list updated- see scanned forms.  Routine anticipatory guidance given to patient.  See health maintenance. The possibility exists that previously documented standard health maintenance information may have been brought forward from a previous encounter into this note.  If needed, that same information has been updated to reflect the current situation based on today's encounter.   ? ?Flu 2022 ?Shingles prev done.  ?PNA up to date ?Tetanus 2014 ?COVID-vaccine previously done. ?Colonoscopy 2021 ?Breast cancer screening 2022 ?Bone density test 2022 per outside clinic. ?Advance directive d/w pt.  See below.   ?Cognitive function addressed- see scanned forms- and if abnormal then additional documentation follows.  ? ?======================= ?Living will d/w pt.  Daughter Colletta [place] designated if patient were incapacitated in an emergency.  Patient would like attempts to be made to contact all of  her kids (including her two sons) so they can be involved in decisions if at all possible.   ?  ?She clearly started that she wants to be a DNR.  If she has no pulse or breathing, then she want to be left in peace and have not have CPR done.   ?  ?Her brother spent months in a coma prior to death and she doesn't want to go through anything like that.  D/w pt.  DNR form done at [place]. ?======================= ? ?In addition to [facility], follow up visit for the below conditions: ? ?She was able to get her book published prev and she brought me a copy of it today.  I thanked her.  ? ?She had ovarian cyst eval per Dr. Radene Knee.  I'll defer to gyn clinic.  Discussed with patient.  She agrees. ? ?She had pulmonary eval re: bronchiectasis.  She tried changing to nebulizer.  It helped when used but it caused elevated heart rate.  I'll check with pulmonary about changing SABA to xopenex.  She has sig environmental triggers, ie cooking with spices, certain smells noted in the environment.   ? ?Hypertension:    ?Using medication without problems or lightheadedness:  occ lightheaded on standing.  ?Edema: no ?Short of breath:  see above.   ?Lower BP noted.  ?Labs d/w pt.   ? ?Some occ heel pain noted.  D/w pt, see avs.  I asked her to monitor her symptoms. ? ?PMH and SH reviewed ? ?Meds, vitals, and allergies reviewed.  ? ?ROS: Per HPI.  Unless specifically indicated otherwise in HPI, the patient denies: ? ?General: fever. ?Eyes: acute vision changes ?ENT: sore  throat ?Cardiovascular: chest pain ?Respiratory: SOB ?GI: vomiting ?GU: dysuria ?Musculoskeletal: acute back pain ?Derm: acute rash ?Neuro: acute motor dysfunction ?Psych: worsening mood ?Endocrine: polydipsia ?Heme: bleeding ?Allergy: hayfever ? ?GEN: nad, alert and oriented ?HEENT: ncat ?NECK: supple w/o LA ?CV: rrr. ?PULM: ctab, no inc wob ?ABD: soft, +bs ?EXT: no edema ?SKIN: no acute rash ?Itchy faint patch on the R forearm.  Was prev told it could be eczema.    See after visit summary. ?

## 2021-12-04 DIAGNOSIS — Z0000 Encounter for general adult medical examination without abnormal findings: Secondary | ICD-10-CM | POA: Insufficient documentation

## 2021-12-04 NOTE — Assessment & Plan Note (Signed)
Living will d/w pt.  Daughter Samantha [name] designated if patient were incapacitated in an emergency.  Patient would like attempts to be made to contact all of her kids (including her two sons) so they can be involved in decisions if at all possible.   ?  ?She clearly started that she wants to be a DNR.  If she has no pulse or breathing, then she want to be left in peace and have not have CPR done.   ?  ?Her brother spent months in a coma prior to death and she doesn't want to go through anything like that.  D/w pt.  DNR form done at [place]. ?

## 2021-12-04 NOTE — Assessment & Plan Note (Signed)
Discussed options.I asked her to cut her losartan hydrochlorothiazide in half and let me know how she feels in about 10 days (lightheaded, fatigue, etc).  She had noticed fatigue lightheadedness and lower blood pressures at home and all of that could be related. ?

## 2021-12-04 NOTE — Assessment & Plan Note (Signed)
She had pulmonary eval re: bronchiectasis.  She tried changing to nebulizer.  It helped when used but it caused elevated heart rate.  I'll check with pulmonary about changing SABA to xopenex.  She has sig environmental triggers, ie cooking with spices, certain smells noted in the environment.   ? ?I presume it would be reasonable to transition the patient to Xopenex but I wanted pulmonary input. ?

## 2021-12-04 NOTE — Assessment & Plan Note (Signed)
Flu 2022 ?Shingles prev done.  ?PNA up to date ?Tetanus 2014 ?COVID-vaccine previously done. ?Colonoscopy 2021 ?Breast cancer screening 2022 ?Bone density test 2022 per outside clinic. ?Advance directive d/w pt.  See below.   ?Cognitive function addressed- see scanned forms- and if abnormal then additional documentation follows.  ?

## 2021-12-17 ENCOUNTER — Telehealth: Payer: Self-pay | Admitting: Family Medicine

## 2021-12-17 NOTE — Telephone Encounter (Signed)
Dr. Kathryne Eriksson patient noted elevated heart rate with albuterol use.  I thought it was reasonable to change to Xopenex.  Did you have any other ideas that would be more suitable?  Please let me know.  Thanks. ?

## 2021-12-18 NOTE — Telephone Encounter (Signed)
Samantha [name]- ?She has glaucoma, so I avoid LAMAs like ipratropium unless we get ok from ophthalmology. Xopenex is quite reasonable. Hopefully she will continue to tolerate her Symbicort. ?-Clint ?

## 2021-12-19 ENCOUNTER — Other Ambulatory Visit: Payer: Self-pay | Admitting: Family Medicine

## 2021-12-19 DIAGNOSIS — Z1231 Encounter for screening mammogram for malignant neoplasm of breast: Secondary | ICD-10-CM

## 2021-12-19 MED ORDER — LEVALBUTEROL TARTRATE 45 MCG/ACT IN AERO: 1.0000 | INHALATION_SPRAY | Freq: Four times a day (QID) | RESPIRATORY_TRACT | 12 refills | Status: DC | PRN

## 2021-12-19 NOTE — Telephone Encounter (Signed)
See below.  Appreciate pulmonary input.  Tell patient that it would be reasonable to try Xopenex to see if she gets some relief without having elevated heart rate.  She can use this instead of albuterol.  I sent the prescription.  Update [place] as needed.  Thanks. ?

## 2021-12-19 NOTE — Telephone Encounter (Signed)
Spoke with patient and she states the inhaler is not the one that gives her issues; its the liquid for her nebulizer that causes her heart to race. She needs something changed for the neb not inhaler. And rx needs to go to [place]. Patient also stated that her sx have resolved since she cut her BP med in half; shes doing much better.  ?

## 2021-12-20 MED ORDER — LEVALBUTEROL HCL 1.25 MG/3ML IN NEBU: 1.2500 mg | INHALATION_SOLUTION | Freq: Four times a day (QID) | RESPIRATORY_TRACT | 12 refills | Status: DC | PRN

## 2021-12-20 NOTE — Addendum Note (Signed)
Addended by: Tonia Ghent on: 12/20/2021 07:40 AM ? ? Modules accepted: Orders ? ?

## 2021-12-20 NOTE — Telephone Encounter (Signed)
Sent. Thanks.  ?Glad she feels better on the lower dose of BP medicine.  Would continue as is.  ?

## 2021-12-20 NOTE — Telephone Encounter (Signed)
Patient notified rx was fixed and sent to pharmacy and advised to continue BP med as is.  ?

## 2021-12-21 ENCOUNTER — Other Ambulatory Visit: Payer: Self-pay

## 2021-12-21 ENCOUNTER — Ambulatory Visit: Payer: Medicare Other | Attending: Internal Medicine

## 2021-12-21 DIAGNOSIS — Z23 Encounter for immunization: Secondary | ICD-10-CM

## 2021-12-21 MED ORDER — PFIZER COVID-19 VAC BIVALENT 30 MCG/0.3ML IM SUSP
INTRAMUSCULAR | 0 refills | Status: DC
  Filled 2021-12-21: qty 0.3, 1d supply, fill #0

## 2021-12-27 ENCOUNTER — Encounter: Payer: Self-pay | Admitting: Family Medicine

## 2021-12-27 ENCOUNTER — Ambulatory Visit (INDEPENDENT_AMBULATORY_CARE_PROVIDER_SITE_OTHER): Payer: Medicare Other | Admitting: Family Medicine

## 2021-12-27 VITALS — BP 120/60 | HR 66 | Temp 97.8°F | Ht 62.0 in | Wt 131.5 lb

## 2021-12-27 DIAGNOSIS — I10 Essential (primary) hypertension: Secondary | ICD-10-CM

## 2021-12-27 DIAGNOSIS — B360 Pityriasis versicolor: Secondary | ICD-10-CM

## 2021-12-27 DIAGNOSIS — I951 Orthostatic hypotension: Secondary | ICD-10-CM

## 2021-12-27 MED ORDER — FLUCONAZOLE 150 MG PO TABS: 300.0000 mg | ORAL_TABLET | Freq: Once | ORAL | 0 refills | Status: AC

## 2021-12-27 MED ORDER — LOSARTAN POTASSIUM 50 MG PO TABS: 50.0000 mg | ORAL_TABLET | Freq: Every day | ORAL | 3 refills | Status: DC

## 2021-12-27 MED ORDER — LOSARTAN POTASSIUM 50 MG PO TABS: 50.0000 mg | ORAL_TABLET | Freq: Every day | ORAL | 0 refills | Status: DC

## 2021-12-27 NOTE — Patient Instructions (Signed)
You are right now on a combination with losartan 50 mg and hydrochlorothiazide 6.25 mg.   ?--- This is after cutting the original 100 mg and 12.5 mg tablets in half. ? ? ?We are going to change this, so your new medication is only losartan 50 mg in a whole tablet. ?

## 2021-12-27 NOTE — Progress Notes (Signed)
? ? ?Araceli Coufal T. Nyssa Sayegh, MD, [facility] ?Therapist, music at [facility] ?[place] ?[place], [address] ? ?Phone: [phone]  FAX: [phone] ? ?Samantha [name] - 76 y.o. female  MRN [id number]  Date of Birth: 1946/06/03 ? ?Date: 12/27/2021  PCP: Tonia Ghent, MD  Referral: Tonia Ghent, MD ? ?Chief Complaint  ?Patient presents with  ? Hypotension  ? ? ?This visit occurred during the SARS-CoV-2 public health emergency.  Safety protocols were in place, including screening questions prior to the visit, additional usage of staff PPE, and extensive cleaning of exam room while observing appropriate contact time as indicated for disinfecting solutions.  ? ?Subjective:  ? ?Samantha [name] is a 76 y.o. very pleasant female patient with Body mass index is 24.05 kg/m?. who presents with the following: ? ?She is getting some hypotension. ? ?Now feeling dizzy - was in the 50's this AM. ? ?Cutting BP meds in 1/2. ? ?She had been increased on Hyzaar 100/12.5 mg, she was hypertensive the last time she was in the office, and Dr. Damita Dunnings dropped this so that she is taking half a tablet.  She still is now subjectively having some hypotension and dizziness with getting up from seated position or standing. ? ?She also has a flat itchy rash. ?She has been treated in the past but tinea versicolor. ? ? ?Review of Systems is noted in the HPI, as appropriate ? ?Objective:  ? ?BP 120/60   Pulse 66   Temp 97.8 ?F (36.6 ?C) (Oral)   Ht '5\' 2"'$  (1.575 m)   Wt 131 lb 8 oz (59.6 kg)   SpO2 98%   BMI 24.05 kg/m?  ? ?Orthostatic VS for the past 24 hrs: ? BP- Lying Pulse- Lying BP- Sitting Pulse- Sitting BP- Standing at 0 minutes Pulse- Standing at 0 minutes  ?12/27/21 1553 140/87 57 140/62 61 141/59 67  ? ?  ? ?GEN: No acute distress; alert,appropriate. ?PULM: Breathing comfortably in no respiratory distress ?PSYCH: Normally interactive.  ?CV: RRR, no m/g/r  ? ?Flat slightly scaly rash of slight color  change on back, right arm and legs. ? ?Laboratory and Imaging Data: ? ?Assessment and Plan:  ? ?  ICD-10-CM   ?1. Postural hypotension  I95.1   ?  ?2. Tinea versicolor  B36.0   ?  ?3. Essential hypertension  I10   ?  ? ?Diastolic blood pressure drops 30 points with standing.  I am going to drop her losartan and discontinue hydrochlorothiazide. ? ?Treat tinea versicolor. ? ?Patient Instructions  ?You are right now on a combination with losartan 50 mg and hydrochlorothiazide 6.25 mg.   ?--- This is after cutting the original 100 mg and 12.5 mg tablets in half. ? ? ?We are going to change this, so your new medication is only losartan 50 mg in a whole tablet.  ? ?Meds ordered this encounter  ?Medications  ? losartan (COZAAR) 50 MG tablet  ?  Sig: Take 1 tablet (50 mg total) by mouth daily.  ?  Dispense:  90 tablet  ?  Refill:  3  ? losartan (COZAAR) 50 MG tablet  ?  Sig: Take 1 tablet (50 mg total) by mouth daily.  ?  Dispense:  10 tablet  ?  Refill:  0  ? fluconazole (DIFLUCAN) 150 MG tablet  ?  Sig: Take 2 tablets (300 mg total) by mouth once for 1 dose.  ?  Dispense:  2 tablet  ?  Refill:  0  ? ?Medications Discontinued During This Encounter  ?Medication Reason  ? COVID-19 mRNA bivalent vaccine, Pfizer, (PFIZER COVID-19 VAC BIVALENT) injection   ? losartan-hydrochlorothiazide (HYZAAR) 100-12.5 MG tablet   ? ?No orders of the defined types were placed in this encounter. ? ? ?Follow-up: No follow-ups on file. ? ?Dragon [employer] One speech-to-text software was used for transcription in this dictation.  Possible transcriptional errors can occur using Editor, commissioning.  ? ?Signed, ? ?Mazin Emma T. Yalissa Fink, MD ? ? ?Outpatient Encounter Medications as of 12/27/2021  ?Medication Sig  ? Azelastine HCl 0.15 % SOLN Place 2 sprays into both nostrils 2 (two) times daily as needed (runny nose).  ? bifidobacterium infantis (ALIGN) capsule Take 1 capsule by mouth daily.  ? budesonide-formoterol (SYMBICORT) 160-4.5 MCG/ACT inhaler Inhale 2  puffs into the lungs 2 (two) times daily. Rinse mouth  ? cetirizine (ZYRTEC) 10 MG tablet Take 10 mg by mouth at bedtime.   ? Cholecalciferol (VITAMIN D) 50 MCG (2000 UT) CAPS Take 1 capsule by mouth daily.  ? Coenzyme Q10 (CO Q 10) 60 MG CAPS 1 capsule with a meal  ? cromolyn (NASALCROM) 5.2 MG/ACT nasal spray Place 1 spray into both nostrils 2 (two) times daily as needed for allergies.  ? fluconazole (DIFLUCAN) 150 MG tablet Take 2 tablets (300 mg total) by mouth once for 1 dose.  ? levalbuterol (XOPENEX HFA) 45 MCG/ACT inhaler Inhale 1-2 puffs into the lungs every 6 (six) hours as needed for wheezing.  ? levalbuterol (XOPENEX) 1.25 MG/3ML nebulizer solution Take 1.25 mg by nebulization every 6 (six) hours as needed for wheezing.  ? losartan (COZAAR) 50 MG tablet Take 1 tablet (50 mg total) by mouth daily.  ? losartan (COZAAR) 50 MG tablet Take 1 tablet (50 mg total) by mouth daily.  ? Magnesium Malate POWD ('1250mg'$  tabs) 1 tablet by mouth qhs  ? Manganese 50 MG TABS Take 50 mg by mouth at bedtime.   ? montelukast (SINGULAIR) 10 MG tablet Take 1 tablet (10 mg total) by mouth at bedtime.  ? Olopatadine HCl (PATADAY) 0.2 % SOLN Place 1 drop into both eyes daily as needed (itchy eyes).  ? pantoprazole (PROTONIX) 40 MG tablet Take 1 tablet (40 mg total) by mouth 2 (two) times daily.  ? Polyethyl Glycol-Propyl Glycol (SYSTANE) 0.4-0.3 % GEL ophthalmic gel Place 1 application into both eyes every 6 (six) hours as needed (dry eyes).  ? Pseudoephedrine-Guaifenesin [phone] MG TB12 Take 1,200 mg by mouth as needed.  ? TART CHERRY PO Take by mouth. Take 1 tablet one time a day by mouth  ? thiamine (VITAMIN B-1) 50 MG tablet Take 100 mg by mouth daily.  ? TURMERIC PO Take 1 tablet by mouth daily.   ? [DISCONTINUED] losartan-hydrochlorothiazide (HYZAAR) 100-12.5 MG tablet Take 0.5 tablets by mouth daily.  ? [DISCONTINUED] COVID-19 mRNA bivalent vaccine, Pfizer, (PFIZER COVID-19 VAC BIVALENT) injection Inject into the muscle.   ? ?No facility-administered encounter medications on file as of 12/27/2021.  ?  ?

## 2022-01-11 ENCOUNTER — Other Ambulatory Visit
Admission: RE | Admit: 2022-01-11 | Discharge: 2022-01-11 | Disposition: A | Discharge status: Home / Self Care | Payer: Medicare Other | Attending: Ophthalmology | Admitting: Ophthalmology

## 2022-01-11 DIAGNOSIS — R519 Headache, unspecified: Secondary | ICD-10-CM | POA: Insufficient documentation

## 2022-01-11 DIAGNOSIS — H5711 Ocular pain, right eye: Secondary | ICD-10-CM | POA: Diagnosis not present

## 2022-01-11 LAB — SEDIMENTATION RATE: Sed Rate: 29 mm/hr (ref 0–30)

## 2022-01-11 LAB — CBC WITH DIFFERENTIAL/PLATELET
Abs Immature Granulocytes: 0.02 10*3/uL (ref 0.00–0.07)
Basophils Absolute: 0 10*3/uL (ref 0.0–0.1)
Basophils Relative: 0 %
Eosinophils Absolute: 0 10*3/uL (ref 0.0–0.5)
Eosinophils Relative: 1 %
HCT: 37 % (ref 36.0–46.0)
Hemoglobin: 12.5 g/dL (ref 12.0–15.0)
Immature Granulocytes: 0 %
Lymphocytes Relative: 36 %
Lymphs Abs: 1.8 10*3/uL (ref 0.7–4.0)
MCH: 28.9 pg (ref 26.0–34.0)
MCHC: 33.8 g/dL (ref 30.0–36.0)
MCV: 85.6 fL (ref 80.0–100.0)
Monocytes Absolute: 0.4 10*3/uL (ref 0.1–1.0)
Monocytes Relative: 8 %
Neutro Abs: 2.6 10*3/uL (ref 1.7–7.7)
Neutrophils Relative %: 55 %
Platelets: 177 10*3/uL (ref 150–400)
RBC: 4.32 MIL/uL (ref 3.87–5.11)
RDW: 12.8 % (ref 11.5–15.5)
WBC: 4.9 10*3/uL (ref 4.0–10.5)
nRBC: 0 % (ref 0.0–0.2)

## 2022-01-11 LAB — C-REACTIVE PROTEIN: CRP: 0.5 mg/dL (ref <1.0)

## 2022-01-18 ENCOUNTER — Other Ambulatory Visit: Payer: Self-pay | Admitting: Gastroenterology

## 2022-01-18 ENCOUNTER — Other Ambulatory Visit (HOSPITAL_COMMUNITY): Payer: Self-pay | Admitting: Gastroenterology

## 2022-01-18 DIAGNOSIS — R109 Unspecified abdominal pain: Secondary | ICD-10-CM

## 2022-01-18 DIAGNOSIS — K219 Gastro-esophageal reflux disease without esophagitis: Secondary | ICD-10-CM | POA: Diagnosis not present

## 2022-01-18 DIAGNOSIS — K5900 Constipation, unspecified: Secondary | ICD-10-CM | POA: Diagnosis not present

## 2022-01-20 NOTE — Progress Notes (Signed)
? ? Patient ID: Samantha [name], female    DOB: 08-15-1946, 76 y.o.   MRN: [id number] ? ?HPI ?female never smoker followed for allergic rhinitis, asthma, bronchiectasis,  complicated by GERD/ LPR, Raynaud's, HBP, IBS, Glaucoma , Aortic Atherosclerosis,  ?Barium swallow 01/09/2016-normal ?PFT 06/05/2019- Mild restriction, normal flows and Diffusion ?-------------------------------------------------------------------------------------- ? ? ?07/24/21- 76 year old female never smoker followed for Allergic Rhinitis, Asthma, Bronchiectasis, complicated by GERD/ LPR, Raynaud's, HBP, IBS, Glaucoma ?-Albuterol hfa, azelastine nasal, Qnasl, Symbicort 160, singulair, Zyrtec, Protonix, -Carafate, ?Covid vax-3 Phizer ?Flu vax-had ?Lab 04/10/21- EOS wnl, IgE 15 wnl ?Pending CT max/fac for 11/23                           ? ENT eval ?ACE 05/19/21- 17 WNL ?IgE 8/1   15 WNL      EOS 0.6  WNL ?Zpak sent 11/2- finished  Now on amoxacillin and rec otc cold remedies. ?She complains of waxing and waning nonspecific symptoms these include hoarseness, itching and burning in the left eye, irritated throat, some cough which is occasionally productive of clear sputum, head congestion, postnasal drainage.  Had [place] in 2020 though tested negative.  Reports a pneumonia last December.  Says she has not felt well enough to take a COVID booster.  ENT evaluation by Dr. Wilburn Cornelia a year or so ago.  She is not aware of reflux but sleeps with head elevated to avoid stomach gurgling noise.  Had allergy testing by Dr. Neldon Mc a few years ago.  Sensitive to odors and has tried Nasalcrom.  Some shortness of breath and tiredness. ?CT chest 05/31/21-  ?IMPRESSION: ?1. Minimal lower lobe bronchiectasis and scarring without acute ?airspace disease or airways thickening. ?2. Borderline to mild cardiomegaly with small pericardial effusion ?Aortic Atherosclerosis (ICD10-I70.0). ? ?01/22/22- 76 year old female never smoker followed for Allergic Rhinitis, Asthma,  Bronchiectasis, complicated by GERD/ LPR, Raynaud's, HBP, IBS, Glaucoma ?-Albuterol hfa, azelastine nasal, Qnasl, Symbicort 160, singulair, Zyrtec, Protonix, -Carafate, ?Covid vax-4 Phizer ?Flu vax-had ?Lab 04/10/21- EOS wnl, IgE 15 wnl ?She says breathing has done very well through the spring pollen season.  She wears her COVID mask anytime she goes out in public and has had no recent respiratory infection. ?Incidental leg and hip pain may be interfering with sleep some.  Inhalers are okay with no concerns.  Not using rescue inhaler frequently now. ?CT max fac 08/02/21- ?IMPRESSION: ?Clear sinuses.  No nasal septal deviation. ? ? ?Review of Systems-see HPI   + = positive ?Constitutional:   No weight loss, night sweats,  Fevers, chills, fatigue, lassitude. ?HEENT:   No headaches,  Difficulty swallowing,  Tooth/dental problems,  Sore throat,  ?              No sneezing, itching, or ear ache,   nasal congestion, post nasal drip, ?CV:  + chest pain, orthopnea, PND, swelling in lower extremities, anasarca, dizziness, palpitations ?GI  No heartburn, indigestion, abdominal pain, nausea, vomiting,  ?Resp: No shortness of breath with exertion or at rest.  No excess mucus, +productive cough,  +-non-productive cough,  No coughing up of blood.  No change in color of mucus.  +infrequent wheezing.   ?Skin: Clear ?GU:  ?MS:  + joint pain or swelling.   ?Psych:  No change in mood or affect. No depression or anxiety.  No memory loss. ?  ?Objective:  ? Physical Exam ?General- Alert, Oriented, Affect-appropriate, Distress- none acute; pleasant  ?Skin- clear ?Lymphadenopathy- none ?Head- atraumatic ?  Eyes- Gross vision intact, PERRLA, conjunctivae clear secretions, ?           Ears- Normal hearing ?           Nose- clear, no-Septal dev, mucus, polyps, erosion, perforation .             ?           Throat- Malampatti III.   TMs not  retracted , mucosa not red, drainage- none, tonsils- atrophic;   ?Neck- flexible , trachea  midline, no stridor , thyroid nl, carotid no bruit ?Chest - symmetrical excursion , unlabored ?          Heart/CV- RRR , no murmur , no gallop  , no rub, nl s1 s2 ?                          - JVD- none , edema-none, stasis changes- none, varices- none ?          Lung-  wheeze or rhonchi-none, cough-none , dullness-none, rub- none, crackles- none ?          Chest wall-  ?Abd-  ?Br/ Gen/ Rectal- Not done, not indicated ?Extrem- cyanosis- none, clubbing, none, atrophy- none, strength- nl.  ?Neuro- grossly intact to observation ? ? ?   ? ? ? ? ?

## 2022-01-22 ENCOUNTER — Encounter: Payer: Self-pay | Admitting: Internal Medicine

## 2022-01-22 ENCOUNTER — Ambulatory Visit (INDEPENDENT_AMBULATORY_CARE_PROVIDER_SITE_OTHER): Payer: Medicare Other | Admitting: Internal Medicine

## 2022-01-22 DIAGNOSIS — J479 Bronchiectasis, uncomplicated: Secondary | ICD-10-CM

## 2022-01-22 DIAGNOSIS — J310 Chronic rhinitis: Secondary | ICD-10-CM

## 2022-01-22 NOTE — Assessment & Plan Note (Signed)
Mild and currently uncomplicated through this spring pollen season.  She still wears her mask outdoors which probably helps.  Medications appropriate. ?Plan-no change ?

## 2022-01-22 NOTE — Patient Instructions (Signed)
Glad you are breathing well. Please call if we can help. ? ? ? ? ?

## 2022-01-22 NOTE — Assessment & Plan Note (Signed)
She indicates symptoms currently under good control.  No intervention needed today. ?

## 2022-01-23 NOTE — Progress Notes (Signed)
Office Visit Note  Patient: Samantha [name]             Date of Birth: 12-05-1945           MRN: [id number]             PCP: Tonia Ghent, MD Referring: Tonia Ghent, MD Visit Date: 01/25/2022 Occupation: '@GUAROCC'$ @  Subjective:  Pain in multiple joints  History of Present Illness: Samantha [name] is a 76 y.o. female with history of osteoarthritis and fibromyalgia syndrome.  She states she has been experiencing increased generalized pain for the last 1 month.  She has been having increased lower back pain.  She also has discomfort in her left trochanteric bursa which is causing nocturnal discomfort.  She is having difficulty walking due to trochanteric bursa pain.  She states the pain radiates down into her left leg.  She also has some discomfort in her left shin.  She has been experiencing increased pain in her hands and her feet.  She continues to have the comfort in her shoulders and her knees.  She states that she may be having a fibromyalgia flare due to some generalized pain and discomfort.  Activities of Daily Living:  Patient reports morning stiffness for all day. Patient Reports nocturnal pain.  Difficulty dressing/grooming: Denies Difficulty climbing stairs: Reports Difficulty getting out of chair: Reports Difficulty using hands for taps, buttons, cutlery, and/or writing: Reports  Review of Systems  Constitutional:  Positive for fatigue.  HENT:  Positive for mouth dryness. Negative for mouth sores and nose dryness.   Eyes:  Positive for dryness. Negative for pain and itching.  Respiratory:  Negative for shortness of breath and difficulty breathing.   Cardiovascular:  Negative for chest pain and palpitations.  Gastrointestinal:  Positive for constipation. Negative for blood in stool and diarrhea.  Endocrine: Negative for increased urination.  Genitourinary:  Negative for difficulty urinating.  Musculoskeletal:  Positive for joint pain, joint pain, joint swelling,  myalgias, morning stiffness, muscle tenderness and myalgias.  Skin:  Negative for color change, rash and sensitivity to sunlight.  Allergic/Immunologic: Positive for susceptible to infections.  Neurological:  Positive for dizziness, headaches and weakness. Negative for numbness and memory loss.  Hematological:  Positive for bruising/bleeding tendency.  Psychiatric/Behavioral:  Negative for confusion.    PMFS History:  Patient Active Problem List   Diagnosis Date Noted   Medicare annual wellness visit, subsequent 12/04/2021   Fatigue 01/08/2021   DNR (do not resuscitate) 07/24/2020   Tinea versicolor 05/30/2020   Myalgia 12/24/2019   Bronchiectasis without complication ([place]) 32/99/2426   Dysphagia 07/23/2019   Nonintractable headache 03/19/2019   Decreased grip strength 02/04/2019   Local superficial swelling 02/02/2019   Weight loss 02/02/2019   Osteopenia 08/09/2018   Chronic rhinitis 07/23/2018   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   Healthcare maintenance 07/16/2018   Moderate persistent asthma with acute exacerbation 01/30/2018   Advance care planning 01/02/2018   Vitamin D deficiency 07/08/2017   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/19/2017   Primary osteoarthritis of both knees 01/19/2017   Primary osteoarthritis of both feet 01/19/2017   Deviated septum 12/07/2016   Diverticulosis 03/19/2016   Seasonal and perennial allergic rhinitis 01/18/2016   Lumbar compression fracture ([place]) 06/20/2015   Fibromyalgia 02/26/2013   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   IRRITABLE BOWEL SYNDROME 04/06/2010   THYROID NODULE, RIGHT 12/07/2009   OSTEOARTHRITIS 11/28/2009   FIBROIDS, UTERUS 05/10/2008  Insomnia 05/10/2008   ASYMPTOMATIC POSTMENOPAUSAL STATUS 05/10/2008   Essential hypertension 01/28/2008   HYPERCHOLESTEROLEMIA 01/07/2008   GLAUCOMA 01/07/2008   Gastroesophageal reflux disease 04/14/2007   Asthma 04/14/2007    Past  Medical History:  Diagnosis Date   ALLERGIC RHINITIS 07/26/2008   ASTHMA 04/14/2007   ASYMPTOMATIC POSTMENOPAUSAL STATUS 05/10/2008   Bronchiectasis ([place])    CHEST PAIN 08/26/2008   COLONIC POLYPS, HX OF 04/14/2007   colonic leiomyoma   Decreased grip strength    Episcleritis    FIBROIDS, UTERUS 05/10/2008   Fibromyalgia    Gait abnormality    GERD 04/14/2007   HYPERCHOLESTEROLEMIA 01/07/2008   HYPERGLYCEMIA 11/28/2009   HYPERTENSION 01/28/2008   INSOMNIA 05/10/2008   Irritable bowel syndrome 04/06/2010   OSTEOARTHRITIS 11/28/2009   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    Family History  Problem Relation Age of Onset   Diabetes Mother    Hypertension Mother    Glaucoma Mother    Polymyositis Mother    Heart disease Father        CHF   Allergic rhinitis Father    Asthma Father    Other Father        sideoblastic anemia   Allergic rhinitis Sister    Asthma Sister    Allergic rhinitis Brother    Asthma Brother    Prostate cancer Brother    Allergic rhinitis Maternal Aunt    Asthma Maternal Aunt    Allergic rhinitis Paternal Aunt    Asthma Paternal Aunt    Breast cancer Cousin    Colon cancer Cousin    Cancer Neg Hx        No FH of Colon Cancer   Angioedema Neg Hx    Atopy Neg Hx    Immunodeficiency Neg Hx    Urticaria Neg Hx    Eczema Neg Hx    Past Surgical History:  Procedure Laterality Date   COLONOSCOPY W/ POLYPECTOMY     ELECTROCARDIOGRAM  12/04/2006   IR RADIOLOGY PERIPHERAL GUIDED IV START  04/21/2018   IR US GUIDE VASC ACCESS LEFT  04/21/2018   NASAL SEPTUM SURGERY     Stress Cardiolite  02/13/2002   Social History   Social History Narrative   Lives alone (widowed 1998).   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      2 sons and 1 daughter.        Right-handed.   No daily caffeine use.   Immunization History  Administered Date(s) Administered   Fluad Quad(high Dose 65+) 05/07/2019, 05/24/2020, 05/19/2021   Influenza Split 06/04/2011,  05/27/2012   Influenza Whole 08/18/2008, 05/26/2010   Influenza, High Dose Seasonal PF 05/22/2017, 06/11/2018   Influenza,inj,Quad PF,6+ Mos 06/08/2013, 05/19/2014, 06/08/2015, 05/07/2016   Influenza-Unspecified 05/17/2020   PFIZER(Purple Top)SARS-COV-2 Vaccination 10/27/2019, 11/17/2019, 10/11/2020   Pfizer Covid-19 Vaccine Bivalent Booster 67yr & up 12/21/2021   Pneumococcal Conjugate-13 03/17/2014   Pneumococcal Polysaccharide-23 11/23/2009, 06/08/2015, 07/14/2018   Td 11/09/2002   Tdap 07/03/2013   Zoster Recombinat (Shingrix) 02/19/2019, 08/27/2019   Zoster, Live 06/17/2013     Objective: Vital Signs: BP (!) 173/79 (BP Location: Left Arm, Patient Position: Sitting, Cuff Size: Normal)   Pulse 60   Ht '5\' 2"'$  (1.575 m)   Wt 134 lb 12.8 oz (61.1 kg)   BMI 24.66 kg/m    Physical Exam Vitals and nursing note reviewed.  Constitutional:      Appearance: She is well-developed.  HENT:     Head: Normocephalic and atraumatic.  Eyes:     Conjunctiva/sclera: Conjunctivae normal.  Cardiovascular:     Rate and Rhythm: Normal rate and regular rhythm.     Heart sounds: Normal heart sounds.  Pulmonary:     Effort: Pulmonary effort is normal.     Breath sounds: Normal breath sounds.  Abdominal:     General: Bowel sounds are normal.     Palpations: Abdomen is soft.  Musculoskeletal:     Cervical back: Normal range of motion.  Lymphadenopathy:     Cervical: No cervical adenopathy.  Skin:    General: Skin is warm and dry.     Capillary Refill: Capillary refill takes less than 2 seconds.  Neurological:     Mental Status: She is alert and oriented to person, place, and time.  Psychiatric:        Behavior: Behavior normal.     Musculoskeletal Exam: C-spine was in good range of motion.  She had discomfort range of motion of bilateral shoulder joints.  Elbow joints and wrist joints with good range of motion.  She had bilateral PIP and DIP thickening with no synovitis.  She had painful  range of motion of bilateral hip joints.  She had tenderness on palpation over left trochanteric bursa.  Knee joints with good range of motion.  There was no tenderness over ankles or MTPs.  She had generalized hyperalgesia and positive tender points.  CDAI Exam: CDAI Score: -- Patient Global: --; Provider Global: -- Swollen: --; Tender: -- Joint Exam 01/25/2022   No joint exam has been documented for this visit   There is currently no information documented on the homunculus. Go to the Rheumatology activity and complete the homunculus joint exam.  Investigation: No additional findings.  Imaging: No results found.  Recent Labs: Lab Results  Component Value Date   WBC 4.9 01/11/2022   HGB 12.5 01/11/2022   PLT 177 01/11/2022   NA 140 10/20/2021   K 4.2 10/20/2021   CL 102 10/20/2021   CO2 34 (H) 10/20/2021   GLUCOSE 98 10/20/2021   BUN 14 10/20/2021   CREATININE 0.90 10/20/2021   BILITOT 0.5 10/20/2021   ALKPHOS 61 10/20/2021   AST 19 10/20/2021   ALT 10 10/20/2021   PROT 7.0 10/20/2021   ALBUMIN 4.3 10/20/2021   CALCIUM 10.2 10/20/2021   GFRAA >60 03/10/2019    Speciality Comments: No specialty comments available.  Procedures:  No procedures performed Allergies: 2,4-d dimethylamine; Albuterol; Cefuroxime axetil; Doxycycline; Iodinated contrast media; Latex; Lisinopril; Lovastatin; Other; and Sulfonamide derivatives   Assessment / Plan:     Visit Diagnoses: Primary osteoarthritis of both hands-she complains of pain and discomfort in her bilateral hands.  Bilateral PIP and DIP thickening with no synovitis was noted.  Joint protection muscle strengthening was discussed.  Bilateral carpal tunnel syndrome - NCV with EMG on 05/06/19: moderate right carpal tunnel syndrome.   Primary osteoarthritis of both shoulders-she had discomfort range of motion of her bilateral shoulders.  Trochanteric bursitis, left hip-she has been having increased pain and discomfort over the  left trochanteric bursa and nocturnal pain.  She is having difficulty sleeping at night.  Handout on IT band stretches was given.  She requested a cortisone injection which could not be given as her blood pressure was elevated at 173/79.  Primary osteoarthritis of both hips-she had limited painful range of motion of bilateral hip joints.  Need for regular exercise was discussed.  She will benefit from swimming and water aerobics.  Primary osteoarthritis  of both knees-she complains of discomfort in her knee joints.  No warmth swelling or effusion was noted.  Lower extremity muscle strengthening exercises were discussed.  Primary osteoarthritis of both feet-proper fitting shoes were discussed.  Fibromyalgia - She was evaluated by Dr. Krista Blue on 06/28/2020 who suggested a trial of Cymbalta.  He continues to have generalized pain and discomfort from fibromyalgia.  I will refer her to physical therapy.  Other insomnia-good sleep hygiene was discussed.  Other fatigue-related to fibromyalgia.  History of vitamin D deficiency-she takes vitamin D supplement.  Osteopenia of multiple sites - 07/22 DXA c/w osteopenia done by GYN.  History of diverticulosis  History of gastroesophageal reflux (GERD)  History of IBS  History of asthma  History of kidney stones  History of glaucoma  History of hyperlipidemia  History of hypertension-blood pressure was elevated at 173/79.  I advised her to monitor blood pressure closely and follow-up with her PCP.  Orders: Orders Placed This Encounter  Procedures   Ambulatory referral to Physical Therapy   No orders of the defined types were placed in this encounter.    Follow-Up Instructions: Return in about 1 year (around 01/26/2023) for Osteoarthritis.   Bo Merino, MD  Note - This record has been created using Editor, commissioning.  Chart creation errors have been sought, but may not always  have been located. Such creation errors do not reflect on   the standard of medical care.

## 2022-01-25 ENCOUNTER — Ambulatory Visit (INDEPENDENT_AMBULATORY_CARE_PROVIDER_SITE_OTHER): Payer: Medicare Other | Admitting: Rheumatology

## 2022-01-25 ENCOUNTER — Encounter: Payer: Self-pay | Admitting: Rheumatology

## 2022-01-25 ENCOUNTER — Encounter: Payer: Self-pay | Admitting: Family Medicine

## 2022-01-25 ENCOUNTER — Ambulatory Visit (INDEPENDENT_AMBULATORY_CARE_PROVIDER_SITE_OTHER): Payer: Medicare Other | Admitting: Family Medicine

## 2022-01-25 VITALS — BP 173/79 | HR 60 | Ht 62.0 in | Wt 134.8 lb

## 2022-01-25 DIAGNOSIS — M8589 Other specified disorders of bone density and structure, multiple sites: Secondary | ICD-10-CM | POA: Diagnosis not present

## 2022-01-25 DIAGNOSIS — Z8679 Personal history of other diseases of the circulatory system: Secondary | ICD-10-CM

## 2022-01-25 DIAGNOSIS — M19012 Primary osteoarthritis, left shoulder: Secondary | ICD-10-CM

## 2022-01-25 DIAGNOSIS — M19041 Primary osteoarthritis, right hand: Secondary | ICD-10-CM

## 2022-01-25 DIAGNOSIS — M170 Bilateral primary osteoarthritis of knee: Secondary | ICD-10-CM | POA: Diagnosis not present

## 2022-01-25 DIAGNOSIS — Z8639 Personal history of other endocrine, nutritional and metabolic disease: Secondary | ICD-10-CM | POA: Diagnosis not present

## 2022-01-25 DIAGNOSIS — Z8709 Personal history of other diseases of the respiratory system: Secondary | ICD-10-CM

## 2022-01-25 DIAGNOSIS — M19071 Primary osteoarthritis, right ankle and foot: Secondary | ICD-10-CM

## 2022-01-25 DIAGNOSIS — M19011 Primary osteoarthritis, right shoulder: Secondary | ICD-10-CM

## 2022-01-25 DIAGNOSIS — Z8719 Personal history of other diseases of the digestive system: Secondary | ICD-10-CM

## 2022-01-25 DIAGNOSIS — Z8669 Personal history of other diseases of the nervous system and sense organs: Secondary | ICD-10-CM

## 2022-01-25 DIAGNOSIS — G4709 Other insomnia: Secondary | ICD-10-CM | POA: Diagnosis not present

## 2022-01-25 DIAGNOSIS — I10 Essential (primary) hypertension: Secondary | ICD-10-CM

## 2022-01-25 DIAGNOSIS — M7062 Trochanteric bursitis, left hip: Secondary | ICD-10-CM | POA: Diagnosis not present

## 2022-01-25 DIAGNOSIS — R5383 Other fatigue: Secondary | ICD-10-CM | POA: Diagnosis not present

## 2022-01-25 DIAGNOSIS — M797 Fibromyalgia: Secondary | ICD-10-CM | POA: Diagnosis not present

## 2022-01-25 DIAGNOSIS — Z87442 Personal history of urinary calculi: Secondary | ICD-10-CM

## 2022-01-25 DIAGNOSIS — M19042 Primary osteoarthritis, left hand: Secondary | ICD-10-CM

## 2022-01-25 DIAGNOSIS — M160 Bilateral primary osteoarthritis of hip: Secondary | ICD-10-CM | POA: Diagnosis not present

## 2022-01-25 DIAGNOSIS — G5603 Carpal tunnel syndrome, bilateral upper limbs: Secondary | ICD-10-CM

## 2022-01-25 DIAGNOSIS — M19072 Primary osteoarthritis, left ankle and foot: Secondary | ICD-10-CM

## 2022-01-25 MED ORDER — HYDROCHLOROTHIAZIDE 12.5 MG PO TABS: 6.2500 mg | ORAL_TABLET | Freq: Every day | ORAL | 0 refills | Status: DC

## 2022-01-25 NOTE — Progress Notes (Signed)
BP has been elevated.  She had prev dec in BP meds back in 12/2021.  She is breaking losartan/HCTZ 100/12.'5mg'$  in half, started that last week.  Her pain could also be affecting her BP readings.  Goal to avoid low BPs, d/w pt.    D/w pt about pending GI study, per outside clinic. I will await GI clinic input.    She reports neg lab eval for temporal arteritis.  Recent labs d/w pt.    Meds, vitals, and allergies reviewed.   ROS: Per HPI unless specifically indicated in ROS section   GEN: nad, alert and oriented HEENT: ncat NECK: supple w/o LA CV: rrr.  PULM: ctab, no inc wob ABD: soft, +bs EXT: no edema SKIN: no acute rash

## 2022-01-25 NOTE — Patient Instructions (Signed)
If your BP is high, then take '50mg'$  losartan and 12.'5mg'$  HCTZ If your BP is controlled, then take '50mg'$  losartan and 6.'25mg'$  HCTZ  Take care.  Glad to see you. Update me as needed.   When your BP is better, then check with Dr. Estanislado Pandy about an injection.

## 2022-01-25 NOTE — Patient Instructions (Signed)
Iliotibial Band Syndrome Rehab Ask your health care provider which exercises are safe for you. Do exercises exactly as told by your health care provider and adjust them as directed. It is normal to feel mild stretching, pulling, tightness, or discomfort as you do these exercises. Stop right away if you feel sudden pain or your pain gets significantly worse. Do not begin these exercises until told by your health care provider. Stretching and range-of-motion exercises These exercises warm up your muscles and joints and improve the movement and flexibility of your hip and pelvis. Quadriceps stretch, prone  Lie on your abdomen (prone position) on a firm surface, such as a bed or padded floor. Bend your left / right knee and reach back to hold your ankle or pant leg. If you cannot reach your ankle or pant leg, loop a belt around your foot and grab the belt instead. Gently pull your heel toward your buttocks. Your knee should not slide out to the side. You should feel a stretch in the front of your thigh and knee (quadriceps). Hold this position for __________ seconds. Repeat __________ times. Complete this exercise __________ times a day. Iliotibial band stretch An iliotibial band is a strong band of muscle tissue that runs from the outer side of your hip to the outer side of your thigh and knee. Lie on your side with your left / right leg in the top position. Bend both of your knees and grab your left / right ankle. Stretch out your bottom arm to help you balance. Slowly bring your top knee back so your thigh goes behind your trunk. Slowly lower your top leg toward the floor until you feel a gentle stretch on the outside of your left / right hip and thigh. If you do not feel a stretch and your knee will not fall farther, place the heel of your other foot on top of your knee and pull your knee down toward the floor with your foot. Hold this position for __________ seconds. Repeat __________ times.  Complete this exercise __________ times a day. Strengthening exercises These exercises build strength and endurance in your hip and pelvis. Endurance is the ability to use your muscles for a long time, even after they get tired. Straight leg raises, side-lying This exercise strengthens the muscles that rotate the leg at the hip and move it away from your body (hip abductors). Lie on your side with your left / right leg in the top position. Lie so your head, shoulder, hip, and knee line up. You may bend your bottom knee to help you balance. Roll your hips slightly forward so your hips are stacked directly over each other and your left / right knee is facing forward. Tense the muscles in your outer thigh and lift your top leg 4-6 inches (10-15 cm). Hold this position for __________ seconds. Slowly lower your leg to return to the starting position. Let your muscles relax completely before doing another repetition. Repeat __________ times. Complete this exercise __________ times a day. Leg raises, prone This exercise strengthens the muscles that move the hips backward (hip extensors). Lie on your abdomen (prone position) on your bed or a firm surface. You can put a pillow under your hips if that is more comfortable for your lower back. Bend your left / right knee so your foot is straight up in the air. Squeeze your buttocks muscles and lift your left / right thigh off the bed. Do not let your back arch. Tense   your thigh muscle as hard as you can without increasing any knee pain. Hold this position for __________ seconds. Slowly lower your leg to return to the starting position and allow it to relax completely. Repeat __________ times. Complete this exercise __________ times a day. Hip hike Stand sideways on a bottom step. Stand on your left / right leg with your other foot unsupported next to the step. You can hold on to a railing or wall for balance if needed. Keep your knees straight and your  torso square. Then lift your left / right hip up toward the ceiling. Slowly let your left / right hip lower toward the floor, past the starting position. Your foot should get closer to the floor. Do not lean or bend your knees. Repeat __________ times. Complete this exercise __________ times a day. This information is not intended to replace advice given to you by your health care provider. Make sure you discuss any questions you have with your health care provider. Document Revised: 11/04/2019 Document Reviewed: 11/04/2019 Elsevier Patient Education  2023 Elsevier Inc.  

## 2022-01-26 ENCOUNTER — Ambulatory Visit: Payer: Medicare Other

## 2022-01-29 NOTE — Assessment & Plan Note (Signed)
Discussed options.  If BP is high, then take '50mg'$  losartan and 12.'5mg'$  HCTZ If BP is controlled, then take '50mg'$  losartan and 6.'25mg'$  HCTZ ie she can take extra 6.'25mg'$  HCTZ if BP is elevated.   She can update me as needed.   When her BP is better, then she can check with Dr. Estanislado Pandy about an injection- prev was deferred given her BP elevation.

## 2022-01-31 ENCOUNTER — Ambulatory Visit: Payer: Medicare Other | Admitting: Rheumatology

## 2022-01-31 DIAGNOSIS — Z8679 Personal history of other diseases of the circulatory system: Secondary | ICD-10-CM

## 2022-01-31 DIAGNOSIS — M8589 Other specified disorders of bone density and structure, multiple sites: Secondary | ICD-10-CM

## 2022-01-31 DIAGNOSIS — M160 Bilateral primary osteoarthritis of hip: Secondary | ICD-10-CM

## 2022-01-31 DIAGNOSIS — R5383 Other fatigue: Secondary | ICD-10-CM

## 2022-01-31 DIAGNOSIS — Z8709 Personal history of other diseases of the respiratory system: Secondary | ICD-10-CM

## 2022-01-31 DIAGNOSIS — Z87442 Personal history of urinary calculi: Secondary | ICD-10-CM

## 2022-01-31 DIAGNOSIS — Z8719 Personal history of other diseases of the digestive system: Secondary | ICD-10-CM

## 2022-01-31 DIAGNOSIS — M19011 Primary osteoarthritis, right shoulder: Secondary | ICD-10-CM

## 2022-01-31 DIAGNOSIS — Z8669 Personal history of other diseases of the nervous system and sense organs: Secondary | ICD-10-CM

## 2022-01-31 DIAGNOSIS — Z8639 Personal history of other endocrine, nutritional and metabolic disease: Secondary | ICD-10-CM

## 2022-01-31 DIAGNOSIS — M19072 Primary osteoarthritis, left ankle and foot: Secondary | ICD-10-CM

## 2022-01-31 DIAGNOSIS — M19041 Primary osteoarthritis, right hand: Secondary | ICD-10-CM

## 2022-01-31 DIAGNOSIS — G5603 Carpal tunnel syndrome, bilateral upper limbs: Secondary | ICD-10-CM

## 2022-01-31 DIAGNOSIS — G4709 Other insomnia: Secondary | ICD-10-CM

## 2022-01-31 DIAGNOSIS — M170 Bilateral primary osteoarthritis of knee: Secondary | ICD-10-CM

## 2022-01-31 DIAGNOSIS — M797 Fibromyalgia: Secondary | ICD-10-CM

## 2022-02-06 ENCOUNTER — Ambulatory Visit
Admission: RE | Admit: 2022-02-06 | Discharge: 2022-02-06 | Disposition: A | Discharge status: Home / Self Care | Payer: Medicare Other | Source: Ambulatory Visit | Attending: Family Medicine | Admitting: Family Medicine

## 2022-02-06 DIAGNOSIS — Z1231 Encounter for screening mammogram for malignant neoplasm of breast: Secondary | ICD-10-CM | POA: Diagnosis not present

## 2022-02-07 ENCOUNTER — Ambulatory Visit (HOSPITAL_COMMUNITY)
Admission: RE | Admit: 2022-02-07 | Discharge: 2022-02-07 | Disposition: A | Discharge status: Home / Self Care | Payer: Medicare Other | Source: Ambulatory Visit | Attending: Gastroenterology | Admitting: Gastroenterology

## 2022-02-07 DIAGNOSIS — R1011 Right upper quadrant pain: Secondary | ICD-10-CM | POA: Diagnosis not present

## 2022-02-07 DIAGNOSIS — R109 Unspecified abdominal pain: Secondary | ICD-10-CM | POA: Diagnosis not present

## 2022-02-07 MED ORDER — TECHNETIUM TC 99M MEBROFENIN IV KIT
5.3000 | PACK | Freq: Once | INTRAVENOUS | Status: AC | PRN
  Administered 2022-02-07: 5.3 via INTRAVENOUS

## 2022-02-08 ENCOUNTER — Telehealth: Payer: Self-pay | Admitting: Family Medicine

## 2022-02-08 ENCOUNTER — Other Ambulatory Visit: Payer: Self-pay | Admitting: Family Medicine

## 2022-02-08 MED ORDER — PANTOPRAZOLE SODIUM 40 MG PO TBEC: 40.0000 mg | DELAYED_RELEASE_TABLET | Freq: Two times a day (BID) | ORAL | 3 refills | Status: DC

## 2022-02-08 NOTE — Telephone Encounter (Signed)
Erx sent

## 2022-02-08 NOTE — Telephone Encounter (Signed)
  Encourage patient to contact the pharmacy for refills or they can request refills through [place]:  Please schedule appointment if longer than 1 year  NEXT APPOINTMENT DATE:  MEDICATION:QNASL ( BECLOMETASONE  Is the patient out of medication?   PHARMACY: [facility] - [place], [address] Phone:  [phone]      Let patient know to contact pharmacy at the end of the day to make sure medication is ready.  Please notify patient to allow 48-72 hours to process  CLINICAL FILLS OUT ALL BELOW:   LAST REFILL:  QTY:  REFILL DATE:    OTHER COMMENTS:    Okay for refill?  Please advise

## 2022-02-08 NOTE — Telephone Encounter (Signed)
  Encourage patient to contact the pharmacy for refills or they can request refills through [place]:  Please schedule appointment if longer than 1 year  NEXT APPOINTMENT DATE:  MEDICATION:pantoprazole (PROTONIX) 40 MG tablet  Is the patient out of medication?   PHARMACY: [facility] - [place], [address] Phone:  [phone]      Let patient know to contact pharmacy at the end of the day to make sure medication is ready.  Please notify patient to allow 48-72 hours to process  CLINICAL FILLS OUT ALL BELOW:   LAST REFILL:  QTY:  REFILL DATE:    OTHER COMMENTS:    Okay for refill?  Please advise

## 2022-02-08 NOTE — Telephone Encounter (Signed)
I do no see this on her medication list anymore; okay to fill?

## 2022-02-11 MED ORDER — QNASL 80 MCG/ACT NA AERS
2.0000 | INHALATION_SPRAY | Freq: Every day | NASAL | 1 refills | Status: DC
Start: 2022-02-11 — End: 2022-02-11

## 2022-02-11 NOTE — Telephone Encounter (Signed)
This is okay to use.  See if this was an automatic request or if she requested.  If she requested it, then please sign off the order and send.  Thanks.

## 2022-02-12 MED ORDER — QNASL 80 MCG/ACT NA AERS: 2.0000 | INHALATION_SPRAY | Freq: Every day | NASAL | 1 refills | Status: DC

## 2022-02-12 NOTE — Telephone Encounter (Signed)
Yes patient requested. She has called the office twice for this request on friday

## 2022-02-15 NOTE — Telephone Encounter (Signed)
Err

## 2022-02-19 ENCOUNTER — Ambulatory Visit: Payer: Medicare Other | Admitting: Rheumatology

## 2022-02-26 ENCOUNTER — Other Ambulatory Visit: Payer: Self-pay

## 2022-02-26 ENCOUNTER — Emergency Department (HOSPITAL_COMMUNITY)
Admission: EM | Admit: 2022-02-26 | Discharge: 2022-02-26 | Disposition: A | Discharge status: Home / Self Care | Payer: Medicare Other | Attending: Emergency Medicine | Admitting: Emergency Medicine

## 2022-02-26 ENCOUNTER — Telehealth: Payer: Self-pay

## 2022-02-26 ENCOUNTER — Encounter (HOSPITAL_COMMUNITY): Payer: Self-pay

## 2022-02-26 ENCOUNTER — Emergency Department (HOSPITAL_COMMUNITY): Payer: Medicare Other

## 2022-02-26 DIAGNOSIS — Y900 Blood alcohol level of less than 20 mg/100 ml: Secondary | ICD-10-CM | POA: Diagnosis not present

## 2022-02-26 DIAGNOSIS — R270 Ataxia, unspecified: Secondary | ICD-10-CM | POA: Insufficient documentation

## 2022-02-26 DIAGNOSIS — R531 Weakness: Secondary | ICD-10-CM

## 2022-02-26 DIAGNOSIS — E876 Hypokalemia: Secondary | ICD-10-CM | POA: Diagnosis not present

## 2022-02-26 DIAGNOSIS — Z79899 Other long term (current) drug therapy: Secondary | ICD-10-CM | POA: Insufficient documentation

## 2022-02-26 DIAGNOSIS — Z91040 Latex allergy status: Secondary | ICD-10-CM | POA: Diagnosis not present

## 2022-02-26 DIAGNOSIS — M6281 Muscle weakness (generalized): Secondary | ICD-10-CM | POA: Insufficient documentation

## 2022-02-26 DIAGNOSIS — I10 Essential (primary) hypertension: Secondary | ICD-10-CM | POA: Insufficient documentation

## 2022-02-26 DIAGNOSIS — R079 Chest pain, unspecified: Secondary | ICD-10-CM | POA: Diagnosis present

## 2022-02-26 DIAGNOSIS — R519 Headache, unspecified: Secondary | ICD-10-CM | POA: Diagnosis not present

## 2022-02-26 DIAGNOSIS — G459 Transient cerebral ischemic attack, unspecified: Secondary | ICD-10-CM | POA: Diagnosis not present

## 2022-02-26 LAB — DIFFERENTIAL
Abs Immature Granulocytes: 0.02 10*3/uL (ref 0.00–0.07)
Basophils Absolute: 0 10*3/uL (ref 0.0–0.1)
Basophils Relative: 1 %
Eosinophils Absolute: 0.1 10*3/uL (ref 0.0–0.5)
Eosinophils Relative: 1 %
Immature Granulocytes: 0 %
Lymphocytes Relative: 37 %
Lymphs Abs: 2 10*3/uL (ref 0.7–4.0)
Monocytes Absolute: 0.4 10*3/uL (ref 0.1–1.0)
Monocytes Relative: 7 %
Neutro Abs: 2.9 10*3/uL (ref 1.7–7.7)
Neutrophils Relative %: 54 %

## 2022-02-26 LAB — I-STAT CHEM 8, ED
BUN: 15 mg/dL (ref 8–23)
Calcium, Ion: 1.18 mmol/L (ref 1.15–1.40)
Chloride: 105 mmol/L (ref 98–111)
Creatinine, Ser: 0.8 mg/dL (ref 0.44–1.00)
Glucose, Bld: 134 mg/dL — ABNORMAL HIGH (ref 70–99)
HCT: 36 % (ref 36.0–46.0)
Hemoglobin: 12.2 g/dL (ref 12.0–15.0)
Potassium: 3.4 mmol/L — ABNORMAL LOW (ref 3.5–5.1)
Sodium: 139 mmol/L (ref 135–145)
TCO2: 27 mmol/L (ref 22–32)

## 2022-02-26 LAB — PROTIME-INR
INR: 1 (ref 0.8–1.2)
Prothrombin Time: 12.7 seconds (ref 11.4–15.2)

## 2022-02-26 LAB — COMPREHENSIVE METABOLIC PANEL
ALT: 13 U/L (ref 0–44)
AST: 24 U/L (ref 15–41)
Albumin: 4.3 g/dL (ref 3.5–5.0)
Alkaline Phosphatase: 66 U/L (ref 38–126)
Anion gap: 8 (ref 5–15)
BUN: 13 mg/dL (ref 8–23)
CO2: 26 mmol/L (ref 22–32)
Calcium: 10.1 mg/dL (ref 8.9–10.3)
Chloride: 106 mmol/L (ref 98–111)
Creatinine, Ser: 0.87 mg/dL (ref 0.44–1.00)
GFR, Estimated: >60 mL/min (ref >60)
Glucose, Bld: 139 mg/dL — ABNORMAL HIGH (ref 70–99)
Potassium: 3.5 mmol/L (ref 3.5–5.1)
Sodium: 140 mmol/L (ref 135–145)
Total Bilirubin: 0.6 mg/dL (ref 0.3–1.2)
Total Protein: 7.4 g/dL (ref 6.5–8.1)

## 2022-02-26 LAB — RAPID URINE DRUG SCREEN, HOSP PERFORMED
Amphetamines: NOT DETECTED
Barbiturates: NOT DETECTED
Benzodiazepines: NOT DETECTED
Cocaine: NOT DETECTED
Opiates: NOT DETECTED
Tetrahydrocannabinol: NOT DETECTED

## 2022-02-26 LAB — CBC
HCT: 38.1 % (ref 36.0–46.0)
Hemoglobin: 13.5 g/dL (ref 12.0–15.0)
MCH: 30.2 pg (ref 26.0–34.0)
MCHC: 35.4 g/dL (ref 30.0–36.0)
MCV: 85.2 fL (ref 80.0–100.0)
Platelets: 190 10*3/uL (ref 150–400)
RBC: 4.47 MIL/uL (ref 3.87–5.11)
RDW: 12.6 % (ref 11.5–15.5)
WBC: 5.4 10*3/uL (ref 4.0–10.5)
nRBC: 0 % (ref 0.0–0.2)

## 2022-02-26 LAB — APTT: aPTT: 28 seconds (ref 24–36)

## 2022-02-26 LAB — TROPONIN I (HIGH SENSITIVITY)
Troponin I (High Sensitivity): 6 ng/L (ref <18)
Troponin I (High Sensitivity): 6 ng/L (ref <18)

## 2022-02-26 LAB — ETHANOL: Alcohol, Ethyl (B): <10 mg/dL (ref <10)

## 2022-02-26 MED ORDER — POTASSIUM CHLORIDE CRYS ER 20 MEQ PO TBCR
40.0000 meq | EXTENDED_RELEASE_TABLET | Freq: Once | ORAL | Status: AC
  Administered 2022-02-26: 40 meq via ORAL
  Filled 2022-02-26: qty 2

## 2022-02-26 MED ORDER — HYDRALAZINE HCL 25 MG PO TABS
50.0000 mg | ORAL_TABLET | Freq: Once | ORAL | Status: AC
  Administered 2022-02-26: 50 mg via ORAL
  Filled 2022-02-26: qty 2

## 2022-02-26 NOTE — ED Provider Triage Note (Signed)
Emergency Medicine Provider Triage Evaluation Note  Samantha [name] , a 76 y.o. female  was evaluated in triage.  Pt complains of feeling off balance, generalized weakness, trouble picking things up with both of her arms, intermittent headache and chest pain.  Symptoms began on 02/23/2022 but got worse yesterday when she woke up.  She contacted her doctor today regarding her symptoms and was told to come to the ER for concerns for "mini strokes."  She noted some intermittent lower extremity swelling that has significantly improved over the weekend.  Denies any current chest pain, headache, abdominal pain.  No injuries or falls.  Review of Systems  Positive: Above Negative: Chest pain, headache, abdominal pain  Physical Exam  BP (!) 171/65 (BP Location: Right Arm)   Pulse 75   Temp 97.7 F (36.5 C) (Oral)   Resp 17   Ht '5\' 2"'$  (1.575 m)   Wt 60.3 kg   SpO2 100%   BMI 24.33 kg/m  Gen:   Awake, no distress   Resp:  Normal effort  MSK:   Moves extremities without difficulty  Other:  No aphasia.  Strength 5/5 in bilateral upper and lower extremities.  Symmetric tongue protrusion.  Medical Decision Making  Medically screening exam initiated at 1:16 PM.  Appropriate orders placed.  FLOREINE KINGDON was informed that the remainder of the evaluation will be completed by another provider, this initial triage assessment does not replace that evaluation, and the importance of remaining in the ED until their evaluation is complete.  No indication for code stroke at this time but will initiate work-up   Delia Heady, PA-C 02/26/22 1320

## 2022-02-26 NOTE — Telephone Encounter (Signed)
Agree with ER eval.  Per EMR, she has arrived at ER.  Will await notes.  Thanks.

## 2022-02-26 NOTE — Telephone Encounter (Signed)
I spoke with pt; pt said on 06/17/23pt had difficulty in turning pages in a book, pt could not write her name on 02/25/22. Pt said her rt hand would not work; pt could also not remember her address or email address. Pt did go to church but several people mentioned to her that she looked like she was not feeling well. Pt said on 02/25/22 had CP and a lot of SOB and felt very tired. Today pt still has foggy feeling in her head and pt balance is off. Pt said on 02/24/22 she did have weakness on one side but pt cannot remember which side. Pt also said on 02/24/22 her lt lower leg was very swollen and hurting and pt does not remember if there was any redness or not. Pt said last night while legs were up overnight the swelling has gone down a lot in lt lower leg but is still tender at lt ankle.I advised pt that these symptoms can be lots of different things but the more concerning is possible pending stroke or blood clot. Pt said she and her daughter had talked about that this morning but pt said the other nurse advised her to go to UC. I said that would be OK if pt does not want to go to ED and if the provider at [facility] thinks pt needs testing that they do not have available at their facility the UC will send pt to ED. Pt said after talking with me she thinks she should go to ED. Pt called her daughter and her daughter is going to pick pt up and take pt to [facility] ED. ED precautions given and if pt condition changes or worsens prior to her daughter getting to pt the pt said she would call EMS. Pt was appreciative for call and will cb in day or so to give update on how she is feeling. Pt already has appt with Dr Damita Dunnings on 03/02/22 and pt wants to keep that appt.Sending to Dr Damita Dunnings and Janett Billow CMA and will teams [facility].

## 2022-02-26 NOTE — Telephone Encounter (Signed)
[facility] - Client TELEPHONE ADVICE RECORD [facility] Patient Name: Samantha [name] Gender: Female DOB: 10/10/1945 Age: 76 Y 40 M 17 D Return Phone Number: [phone] (Primary), [phone] (Secondary) Address: City/ State/ Zip: [place]  [address] Client [facility] - Client Client Site [facility] - Day Provider Renford Dills - MD Contact Type Call Who Is Calling Patient / Member / Family / Caregiver Call Type Triage / Clinical Caller Name Danielle from office Relationship To Patient Other Return Phone Number [phone] (Primary) Chief Complaint CONFUSION - new onset Reason for Call Symptomatic / Request for [place] states her bp was 109/48, 104/38 last night with dizziness. Pt is also having confusion. Translation No Nurse Assessment Nurse: Hassell Done, RN, Joelene Millin Date/Time (Eastern Time): 02/26/2022 9:40:15 AM Confirm and document reason for call. If symptomatic, describe symptoms. ---caller states that yesterday she was feeling off balance and confused. her BP was low at 104/38. current BP 149/66, she states she is still feeling a little "fuzzy in the head". no fever. Does the patient have any new or worsening symptoms? ---Yes Will a triage be completed? ---Yes Related visit to physician within the last 2 weeks? ---No Does the PT have any chronic conditions? (i.e. diabetes, asthma, this includes High risk factors for pregnancy, etc.) ---Yes List chronic conditions. ---HTN, asthma, fibromyalgia Is this a behavioral health or substance abuse call? ---No Guidelines Guideline Title Affirmed Question Affirmed Notes Nurse Date/Time (Eastern Time) Confusion - Delirium [1] Acting confused (e.g., disoriented, slurred speech) AND [2] brief (now gone) Hassell Done, RN, Joelene Millin 02/26/2022 9:52:10 AM Disp. Time Eilene Ghazi Time) Disposition Final User 02/26/2022  9:38:30 AM Send to Urgent Queue Paulita Cradle PLEASE NOTE: All timestamps contained within this report are represented as [place] Standard Time. CONFIDENTIALTY NOTICE: This fax transmission is intended only for the addressee. It contains information that is legally privileged, confidential or otherwise protected from use or disclosure. If you are not the intended recipient, you are strictly prohibited from reviewing, disclosing, copying using or disseminating any of this information or taking any action in reliance on or regarding this information. If you have received this fax in error, please notify us immediately by telephone so that we can arrange for its return to [place]. Phone: [phone], Toll-Free: [phone], Fax: [phone] Page: 2 of 2 Call Id: [id number] 02/26/2022 10:00:18 AM See HCP within 4 Hours (or PCP triage) Yes Hassell Done, RN, Renea Ee Disagree/Comply Comply Caller Understands Yes PreDisposition Call Doctor Care Advice Given Per Guideline SEE HCP (OR PCP TRIAGE) WITHIN 4 HOURS: * IF OFFICE WILL BE OPEN: You need to be seen within the next 3 or 4 hours. Call your doctor (or NP/PA) now or as soon as the office opens. CALL BACK IF: * You become worse CARE ADVICE given per Confusion-Delirium (Adult) guideline. Referrals Warm transfer to [facility]lin

## 2022-02-26 NOTE — ED Notes (Signed)
Patient verbalizes understanding of discharge instructions. Opportunity for questioning and answers were provided. Armband removed by staff, pt discharged from ED. Pt taken to ED entrance via wheel chair.  

## 2022-02-26 NOTE — ED Notes (Signed)
Pt transported to MRI 

## 2022-02-26 NOTE — ED Provider Notes (Signed)
[facility] EMERGENCY DEPARTMENT Provider Note   CSN: [id number] Arrival date & time: 02/26/22  1241     History  Chief Complaint  Patient presents with   Weakness    Samantha [name] is a 76 y.o. female.  Patient presents with a variety of complaints.  Chief complaint appears to be chest pain.  She had chest pain for the past 2 to 3 days describes mid chest ache nonradiating.  Currently upon arrival to the ER states that it is gotten better and nearly gone.  This is also associated with a sensation of numbness in the right hand for 3 days.  Numbness in the left leg for 3 days well and feeling off balance.  Denies fevers or cough denies vomiting or diarrhea.         Home Medications Prior to Admission medications   Medication Sig Start Date End Date Taking? Authorizing Provider  Azelastine HCl 0.15 % SOLN Place 2 sprays into both nostrils 2 (two) times daily as needed (runny nose). 04/10/21   Deneise Lever, MD  Beclomethasone Dipropionate (QNASL) 80 MCG/ACT AERS Place 2 sprays into both nostrils daily. 02/12/22   Tonia Ghent, MD  bifidobacterium infantis (ALIGN) capsule Take 1 capsule by mouth daily.    [provider]  budesonide-formoterol (SYMBICORT) 160-4.5 MCG/ACT inhaler Inhale 2 puffs into the lungs 2 (two) times daily. Rinse mouth 09/13/21   Baird Lyons D, MD  cetirizine (ZYRTEC) 10 MG tablet Take 10 mg by mouth at bedtime.     [provider]  Cholecalciferol (VITAMIN D) 50 MCG (2000 UT) CAPS Take 1 capsule by mouth daily.    [provider]  Coenzyme Q10 (CO Q 10) 60 MG CAPS 1 capsule with a meal    [provider]  cromolyn (NASALCROM) 5.2 MG/ACT nasal spray Place 1 spray into both nostrils 2 (two) times daily as needed for allergies.    [provider]  hydrochlorothiazide (HYDRODIURIL) 12.5 MG tablet Take 0.5-1 tablets (6.25-12.5 mg total) by mouth daily. 01/25/22   Tonia Ghent, MD  levalbuterol  [facility] HFA) 45 MCG/ACT inhaler Inhale 1-2 puffs into the lungs every 6 (six) hours as needed for wheezing. 12/19/21   Tonia Ghent, MD  levalbuterol Penne Lash) 1.25 MG/3ML nebulizer solution Take 1.25 mg by nebulization every 6 (six) hours as needed for wheezing. 12/20/21   Tonia Ghent, MD  losartan (COZAAR) 50 MG tablet Take 1 tablet (50 mg total) by mouth daily. 12/27/21   Copland, Frederico Hamman, MD  Magnesium Malate POWD ('1250mg'$  tabs) 1 tablet by mouth qhs    [provider]  Manganese 50 MG TABS Take 50 mg by mouth at bedtime.     [provider]  montelukast (SINGULAIR) 10 MG tablet Take 1 tablet (10 mg total) by mouth at bedtime. 03/20/21   Tonia Ghent, MD  Olopatadine HCl (PATADAY) 0.2 % SOLN Place 1 drop into both eyes daily as needed (itchy eyes). 04/23/19   Tonia Ghent, MD  pantoprazole (PROTONIX) 40 MG tablet Take 1 tablet (40 mg total) by mouth 2 (two) times daily. 02/08/22   Tonia Ghent, MD  Polyethyl Glycol-Propyl Glycol (SYSTANE) 0.4-0.3 % GEL ophthalmic gel Place 1 application into both eyes every 6 (six) hours as needed (dry eyes).    [provider]  Pseudoephedrine-Guaifenesin [phone] MG TB12 Take 1,200 mg by mouth as needed.    [provider]  TART CHERRY PO Take by mouth.  Take 1 tablet one time a day by mouth    [provider]  thiamine (VITAMIN B-1) 50 MG tablet Take 100 mg by mouth daily.    [provider]  TURMERIC PO Take 1 tablet by mouth daily.     [provider]      Allergies    2,4-d dimethylamine; Albuterol; Cefuroxime axetil; Doxycycline; Iodinated contrast media; Latex; Lisinopril; Lovastatin; Other; and Sulfonamide derivatives    Review of Systems   Review of Systems  Constitutional:  Negative for fever.  HENT:  Negative for ear pain.   Eyes:  Negative for pain.  Respiratory:  Negative for cough.   Cardiovascular:  Positive for chest pain.  Gastrointestinal:  Negative for  abdominal pain.  Genitourinary:  Negative for flank pain.  Musculoskeletal:  Negative for back pain.  Skin:  Negative for rash.  Neurological:  Negative for headaches.    Physical Exam Updated Vital Signs BP (!) 148/47   Pulse 70   Temp 98.8 F (37.1 C) (Oral)   Resp 16   Ht '5\' 2"'$  (1.575 m)   Wt 60.3 kg   SpO2 100%   BMI 24.33 kg/m  Physical Exam Constitutional:      General: She is not in acute distress.    Appearance: Normal appearance.  HENT:     Head: Normocephalic.     Nose: Nose normal.  Eyes:     Extraocular Movements: Extraocular movements intact.  Cardiovascular:     Rate and Rhythm: Normal rate.  Pulmonary:     Effort: Pulmonary effort is normal.  Musculoskeletal:        General: Normal range of motion.     Cervical back: Normal range of motion.  Neurological:     General: No focal deficit present.     Mental Status: She is alert and oriented to person, place, and time. Mental status is at baseline.     Cranial Nerves: No cranial nerve deficit.     Motor: No weakness.     ED Results / Procedures / Treatments   Labs (all labs ordered are listed, but only abnormal results are displayed) Labs Reviewed  COMPREHENSIVE METABOLIC PANEL - Abnormal; Notable for the following components:      Result Value   Glucose, Bld 139 (*)    All other components within normal limits  I-STAT CHEM 8, ED - Abnormal; Notable for the following components:   Potassium 3.4 (*)    Glucose, Bld 134 (*)    All other components within normal limits  ETHANOL  PROTIME-INR  APTT  CBC  DIFFERENTIAL  RAPID URINE DRUG SCREEN, HOSP PERFORMED  URINALYSIS, ROUTINE W REFLEX MICROSCOPIC  TROPONIN I (HIGH SENSITIVITY)  TROPONIN I (HIGH SENSITIVITY)    EKG EKG Interpretation  Date/Time:  Monday February 26 2022 12:59:10 EDT Ventricular Rate:  70 PR Interval:  170 QRS Duration: 80 QT Interval:  394 QTC Calculation: 425 R Axis:   -42 Text Interpretation: Normal sinus rhythm Left  axis deviation Abnormal ECG When compared with ECG of 10-Mar-2019 12:52, PREVIOUS ECG IS PRESENT Confirmed by Thamas Jaegers ([id number]) on 02/26/2022 9:07:49 PM  Radiology MR BRAIN WO CONTRAST  Result Date: 02/26/2022 CLINICAL DATA:  Off balance, weakness, intermittent headaches EXAM: MRI HEAD WITHOUT CONTRAST TECHNIQUE: Multiplanar, multiecho pulse sequences of the brain and surrounding structures were obtained without intravenous contrast. COMPARISON:  10/06/2008 MRI head correlation is also made with CT head 02/26/2022 FINDINGS: Brain: No restricted diffusion to suggest acute or  subacute infarct. No acute hemorrhage, mass mass effect, or midline shift. No hydrocephalus or extra-axial collection. No hemosiderin deposition to suggest remote hemorrhage. Normal craniocervical junction. Scattered T2 hyperintense signal in the periventricular white matter, likely the sequela of minimal chronic small vessel ischemic disease. Vascular: Normal flow voids. Skull and upper cervical spine: Normal marrow signal. Sinuses/Orbits: No acute finding. Other: The mastoids are well aerated. IMPRESSION: No acute intracranial process. No evidence of acute or subacute infarct. Electronically Signed   By: Merilyn Baba M.D.   On: 02/26/2022 20:21   CT HEAD WO CONTRAST  Result Date: 02/26/2022 CLINICAL DATA:  Transient ischemic attack (TIA). Difficulty using right hand for 3 days. EXAM: CT HEAD WITHOUT CONTRAST TECHNIQUE: Contiguous axial images were obtained from the base of the skull through the vertex without intravenous contrast. RADIATION DOSE REDUCTION: This exam was performed according to the departmental dose-optimization program which includes automated exposure control, adjustment of the mA and/or kV according to patient size and/or use of iterative reconstruction technique. COMPARISON:  02/16/2019 head CT. FINDINGS: Brain: Nonspecific mild subcortical and periventricular white matter hypodensity, most in keeping with chronic  small vessel ischemic change. No evidence of parenchymal hemorrhage or extra-axial fluid collection. No mass lesion, mass effect, or midline shift. No CT evidence of acute infarction. Cerebral volume is age appropriate. No ventriculomegaly. Vascular: No acute abnormality. Skull: No evidence of calvarial fracture. Sinuses/Orbits: The visualized paranasal sinuses are essentially clear. Other:  The mastoid air cells are unopacified. IMPRESSION: 1. No evidence of acute intracranial abnormality. 2. Mild chronic small vessel ischemic changes in the cerebral white matter. Electronically Signed   By: Ilona Sorrel M.D.   On: 02/26/2022 14:27    Procedures Procedures    Medications Ordered in ED Medications  potassium chloride SA (KLOR-CON M) CR tablet 40 mEq (has no administration in time range)  hydrALAZINE (APRESOLINE) tablet 50 mg (50 mg Oral Given 02/26/22 2058)    ED Course/ Medical Decision Making/ A&P                           Medical Decision Making Risk Prescription drug management.   Review of record shows office visit Jan 25, 2022 for hypertension with primary care doctor.  Cardiac monitoring showing sinus rhythm.  Patient's potassium found to be low and repleted here in the ER.  Found to be hypertensive as well blood pressure around 1 90-200.  Given hydralazine.  MRI of the brain is unremarkable for acute findings.  Patient's symptoms in combination appear varied and perhaps nonspecific.  Patient states that her primary care doctor decreased her blood pressure medications.  I advised her that perhaps this may have been too much of a decrease.  Instructed to call primary care doctor tomorrow morning to reassess her blood pressure.  Advised immediate return if she has chest pain or any additional concerns otherwise discharged home in stable condition.        Final Clinical Impression(s) / ED Diagnoses Final diagnoses:  Generalized weakness  Hypertension, unspecified type   Hypokalemia    Rx / DC Orders ED Discharge Orders     None         Luna Fuse, MD 02/26/22 2201

## 2022-02-26 NOTE — Discharge Instructions (Addendum)
Call your doctor tomorrow about your blood pressure being high again and perhaps needing to resume some of your medications.  Your labs and studies today were otherwise normal.  Follow-up with your doctor this week.  Return back to the ER if you have recurrent chest pain or worsening symptoms or any additional concerns.

## 2022-02-26 NOTE — ED Triage Notes (Addendum)
Patient has been having trouble using right hand x 3 days.  Had episode of not remember phone number.  Also reports having trouble turning pages in a book.  Complains of left leg swelling. Also reports left upper lip feeling numb that started this am.  Also reports chest pain and sob yesterday.  Patient has been evaluated for tremors and dementia but PCP was not worried about dementia.

## 2022-02-26 NOTE — Telephone Encounter (Addendum)
Triage outcome was to be seen in 3-4 hours, no available appointments please advise. Patient called back and scheduled an appointment for Friday. Caroly states she will not go to ED or UC.

## 2022-02-26 NOTE — Telephone Encounter (Signed)
Patient called in stating that her blood pressure has been reading low and high. Last night her blood pressure was 109/48 and a retake 2 hours later it was reading 128/59 (taken on arm) -104/38 (taken on wrist) currently is symptomatic last night patient was dizzy, having focusing issues,confusion about her address and phone number and has been having balance issues since Friday. Did have patient speak with access nurse as Andreika says she is still experiencing dizziness.

## 2022-02-27 ENCOUNTER — Telehealth: Payer: Self-pay | Admitting: Family Medicine

## 2022-02-27 ENCOUNTER — Encounter: Payer: Self-pay | Admitting: Family Medicine

## 2022-02-27 NOTE — Telephone Encounter (Addendum)
Those BPs are reasonable.  I wouldn't change her meds yet.  I would keep the OV as is and I'll check back on her recent imaging/results in the meantime.    Thanks.   ==================  For charting purposes and will d/w pt at [place] w/u CVA.  No acute intracranial process. No evidence of acute or subacute infarct.

## 2022-02-27 NOTE — Telephone Encounter (Addendum)
Patient notified as instructed by telephone and verbalized understanding.  Patient's son Samantha [name] got on the phone and requested that his mom be worked in sooner than Friday.

## 2022-02-27 NOTE — Telephone Encounter (Signed)
Patient states she wanted to add in that she had an abnormal EEG result from the hospital, the MRI/CT showed white spots as well as stroke symptoms.

## 2022-02-27 NOTE — Telephone Encounter (Signed)
Patient called and is needing to speak with the nurse to give some information to Dr. Damita Dunnings, she was in the ER last night and spoke with Access Nurse about her symptoms. Please advise and return a call back as soon as possible, thanks.   Callback Number: [phone]

## 2022-02-27 NOTE — Telephone Encounter (Signed)
Rtn pt's call.  States she has had brain fog and feeling weak.  States she was instructed by ER doc to relay this info to Dr. Damita Dunnings today. Says pt needs to be seen by cards due to low diastolic BP, systolic BP occasionally too high and chest pain. Also, that pt may need stress test.  Pt prefers a cardiologist in [place] or [place] area if possible but will go to [facility] [facility].   Home BP today 144/48, HR 74- arm.  Then 118/52, 141/64 and 136/63- all done with wrist cuff. Pt has OV on 03/02/22.

## 2022-02-28 NOTE — Telephone Encounter (Signed)
Spoke with pt relaying Dr. Josefine Class message (no open slots available 03/01/22).  Pt verbalizes and will keep 03/02/22 OV. Fyi to Dr. Damita Dunnings.

## 2022-02-28 NOTE — Telephone Encounter (Signed)
I am not in clinic today.  If there is an urgent need/need for immediate eval then ER is the best option.  If there is a slot on Thursday, then offer that.  O/w would keep the appointment on 03/02/22.  Thanks.

## 2022-03-02 ENCOUNTER — Encounter: Payer: Self-pay | Admitting: Family Medicine

## 2022-03-02 ENCOUNTER — Ambulatory Visit (INDEPENDENT_AMBULATORY_CARE_PROVIDER_SITE_OTHER): Payer: Medicare Other | Admitting: Family Medicine

## 2022-03-02 VITALS — BP 148/60 | HR 88 | Temp 98.6°F | Ht 62.0 in | Wt 133.0 lb

## 2022-03-02 DIAGNOSIS — R0789 Other chest pain: Secondary | ICD-10-CM | POA: Diagnosis not present

## 2022-03-02 DIAGNOSIS — I10 Essential (primary) hypertension: Secondary | ICD-10-CM | POA: Diagnosis not present

## 2022-03-02 DIAGNOSIS — R202 Paresthesia of skin: Secondary | ICD-10-CM | POA: Diagnosis not present

## 2022-03-02 MED ORDER — QNASL 80 MCG/ACT NA AERS: 2.0000 | INHALATION_SPRAY | Freq: Every day | NASAL | 1 refills | Status: DC

## 2022-03-04 NOTE — Assessment & Plan Note (Signed)
Refer to cardiology.  Routine cautions given to patient.  Left axis deviation noted but otherwise reassuring.  Recent troponin negative.

## 2022-03-08 ENCOUNTER — Encounter: Payer: Self-pay | Admitting: Family Medicine

## 2022-03-12 ENCOUNTER — Ambulatory Visit (HOSPITAL_COMMUNITY)
Admission: RE | Admit: 2022-03-12 | Discharge: 2022-03-12 | Disposition: A | Discharge status: Home / Self Care | Payer: Medicare Other | Source: Ambulatory Visit | Attending: Family Medicine | Admitting: Family Medicine

## 2022-03-12 DIAGNOSIS — R202 Paresthesia of skin: Secondary | ICD-10-CM | POA: Insufficient documentation

## 2022-03-12 DIAGNOSIS — R0789 Other chest pain: Secondary | ICD-10-CM | POA: Diagnosis not present

## 2022-03-16 NOTE — Progress Notes (Unsigned)
Cardiology Office Note:    Date:  03/16/2022   ID:  Samantha [name], DOB [birth year], MRN [id number]  PCP:  Tonia Ghent, MD   [place] Providers Cardiologist:  None { Click to update primary MD,subspecialty MD or APP then REFRESH:1}    Referring MD: Tonia Ghent, MD   No chief complaint on file. ***  History of Present Illness:    Samantha [name] is a 76 y.o. 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     Allergies:   2,4-d dimethylamine; Albuterol; Cefuroxime axetil; Doxycycline; Iodinated contrast media; Latex; Lisinopril; Lovastatin; Other; and Sulfonamide derivatives   Social History   Socioeconomic History   Marital status: Widowed    Spouse name: Not on file   Number of children: 3   Years of education: some college   Highest education level: Not on file  Occupational History    Employer: RETIRED    Comment: Retired Stage manager)  Tobacco Use   Smoking status: Never     Passive exposure: Never   Smokeless tobacco: Never  Vaping Use   Vaping Use: Never used  Substance and Sexual Activity   Alcohol use: No   Drug use: No   Sexual activity: Never  Other Topics Concern   Not on file  Social History Narrative   Lives alone (widowed 1998).   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      2 sons and 1 daughter.        Right-handed.   No daily caffeine use.   Social Determinants of Health   Financial Resource Strain: Low Risk  (07/12/2020)   Overall Financial Resource Strain (CARDIA)    Difficulty of Paying Living Expenses: Not hard at all  Food Insecurity: No Food Insecurity (07/12/2020)   Hunger Vital Sign    Worried About Running Out of Food in the Last Year: Never true    Ran Out of Food in the Last Year: Never true  Transportation Needs: No Transportation Needs (07/12/2020)   PRAPARE - Hydrologist (Medical): No    Lack of Transportation (Non-Medical): No  Physical Activity: Insufficiently Active (07/12/2020)   Exercise Vital Sign    Days of Exercise per Week: 2 days    Minutes of Exercise per Session: 60 min  Stress: No Stress Concern Present (07/12/2020)   [place]  Feeling of Stress : Not at all  Social Connections: Not on file     Family History: The patient's ***family history includes Allergic rhinitis in her brother, father, maternal aunt, paternal aunt, and sister; Asthma in her brother, father, maternal aunt, paternal aunt, and sister; Breast cancer in her cousin; Colon cancer in her cousin; Diabetes in her mother; Glaucoma in her mother; Heart disease in her father; Hypertension in her mother; Other in her father; Polymyositis in her mother; Prostate cancer in her brother. There is no history of Cancer, Angioedema, Atopy, Immunodeficiency, Urticaria, or Eczema.  ROS:   Please see the history of present illness.    *** All other  systems reviewed and are negative.  EKGs/Labs/Other Studies Reviewed:    The following studies were reviewed today: ***  EKG:  EKG is *** ordered today.  The ekg ordered today demonstrates ***  Recent Labs: 10/20/2021: TSH 3.77 02/26/2022: ALT 13; BUN 15; Creatinine, Ser 0.80; Hemoglobin 12.2; Platelets 190; Potassium 3.4; Sodium 139  Recent Lipid Panel    Component Value Date/Time   CHOL 223 (H) 10/20/2021 0938   TRIG 90.0 10/20/2021 0938   HDL 104.50 10/20/2021 0938   CHOLHDL 2 10/20/2021 0938   VLDL 18.0 10/20/2021 0938   LDLCALC 100 (H) 10/20/2021 0938     Risk Assessment/Calculations:   {Does this patient have ATRIAL FIBRILLATION?[phone]}       Physical Exam:    VS:  There were no vitals taken for this visit.    Wt Readings from Last 3 Encounters:  03/02/22 133 lb (60.3 kg)  02/26/22 133 lb (60.3 kg)  01/25/22 133 lb (60.3 kg)     GEN: *** Well nourished, well developed in no acute distress HEENT: Normal NECK: No JVD; No carotid bruits LYMPHATICS: No lymphadenopathy CARDIAC: ***RRR, no murmurs, rubs, gallops RESPIRATORY:  Clear to auscultation without rales, wheezing or rhonchi  ABDOMEN: Soft, non-tender, non-distended MUSCULOSKELETAL:  No edema; No deformity  SKIN: Warm and dry NEUROLOGIC:  Alert and oriented x 3 PSYCHIATRIC:  Normal affect   ASSESSMENT:    No diagnosis found. PLAN:    In order of problems listed above:  ***      {Are you ordering a CV Procedure (e.g. stress test, cath, DCCV, TEE, etc)?   Press F2        :[id number]}    Medication Adjustments/Labs and Tests Ordered: Current medicines are reviewed at length with the patient today.  Concerns regarding medicines are outlined above.  No orders of the defined types were placed in this encounter.  No orders of the defined types were placed in this encounter.   There are no Patient Instructions on file for this visit.   Signed, Samantha Bergeron, MD  03/16/2022 7:12 AM     [place]

## 2022-03-20 ENCOUNTER — Telehealth: Payer: Self-pay | Admitting: *Deleted

## 2022-03-20 ENCOUNTER — Ambulatory Visit (INDEPENDENT_AMBULATORY_CARE_PROVIDER_SITE_OTHER): Payer: Medicare Other

## 2022-03-20 ENCOUNTER — Encounter: Payer: Self-pay | Admitting: Cardiology

## 2022-03-20 ENCOUNTER — Ambulatory Visit (INDEPENDENT_AMBULATORY_CARE_PROVIDER_SITE_OTHER): Payer: Medicare Other | Admitting: Cardiology

## 2022-03-20 VITALS — BP 140/70 | HR 70 | Ht 62.0 in | Wt 128.8 lb

## 2022-03-20 DIAGNOSIS — R42 Dizziness and giddiness: Secondary | ICD-10-CM

## 2022-03-20 DIAGNOSIS — R002 Palpitations: Secondary | ICD-10-CM | POA: Diagnosis not present

## 2022-03-20 DIAGNOSIS — R079 Chest pain, unspecified: Secondary | ICD-10-CM

## 2022-03-20 NOTE — Telephone Encounter (Signed)
-----   Message from Jennefer Bravo sent at 03/20/2022  2:56 PM EDT ----- Regarding: RE: 3 day zio per Dr. Johney Frame done ----- Message ----- From: Nuala Alpha, LPN Sent: 1/57/2620   2:50 PM EDT To: Jennefer Bravo; Katrina Cloyd Stagers Subject: 3 day zio per Dr. Johney Frame                    Dr. Johney Frame ordered a 3 day zio on this pt.   Please enroll and let me know  Thanks Karlene Einstein

## 2022-03-20 NOTE — Patient Instructions (Signed)
Medication Instructions:   Your physician recommends that you continue on your current medications as directed. Please refer to the Current Medication list given to you today.  *If you need a refill on your cardiac medications before your next appointment, please call your pharmacy*   Testing/Procedures:  Your physician has requested that you have an echocardiogram. Echocardiography is a painless test that uses sound waves to create images of your heart. It provides your doctor with information about the size and shape of your heart and how well your heart's chambers and valves are working. This procedure takes approximately one hour. There are no restrictions for this procedure.   ZIO XT- Long Term Monitor Instructions  Your physician has requested you wear a ZIO patch monitor for 3  days.  This is a single patch monitor. [employer] supplies one patch monitor per enrollment. Additional stickers are not available. Please do not apply patch if you will be having a Nuclear Stress Test,  Echocardiogram, Cardiac CT, MRI, or Chest Xray during the period you would be wearing the  monitor. The patch cannot be worn during these tests. You cannot remove and re-apply the  ZIO XT patch monitor.  Your ZIO patch monitor will be mailed 3 day USPS to your address on file. It may take 3-5 days  to receive your monitor after you have been enrolled.  Once you have received your monitor, please review the enclosed instructions. Your monitor  has already been registered assigning a specific monitor serial # to you.  Billing and Patient Assistance Program Information  We have supplied [employer] with any of your insurance information on file for billing purposes. [employer] offers a sliding scale Patient Assistance Program for patients that do not have  insurance, or whose insurance does not completely cover the cost of the ZIO monitor.  You must apply for the Patient Assistance Program to qualify for this  discounted rate.  To apply, please call [employer] at [phone], select option 4, select option 2, ask to apply for  Patient Assistance Program. Theodore Demark will ask your household income, and how many people  are in your household. They will quote your out-of-pocket cost based on that information.  [employer] will also be able to set up a 16-month interest-free payment plan if needed.  Applying the monitor   Shave hair from upper left chest.  Hold abrader disc by orange tab. Rub abrader in 40 strokes over the upper left chest as  indicated in your monitor instructions.  Clean area with 4 enclosed alcohol pads. Let dry.  Apply patch as indicated in monitor instructions. Patch will be placed under collarbone on left  side of chest with arrow pointing upward.  Rub patch adhesive wings for 2 minutes. Remove white label marked "1". Remove the white  label marked "2". Rub patch adhesive wings for 2 additional minutes.  While looking in a mirror, press and release button in center of patch. A small green light will  flash 3-4 times. This will be your only indicator that the monitor has been turned on.  Do not shower for the first 24 hours. You may shower after the first 24 hours.  Press the button if you feel a symptom. You will hear a small click. Record Date, Time and  Symptom in the Patient Logbook.  When you are ready to remove the patch, follow instructions on the last 2 pages of Patient  Logbook. Stick patch monitor onto the last page of Patient Logbook.  Place  Patient Logbook in the blue and white box. Use locking tab on box and tape box closed  securely. The blue and white box has prepaid postage on it. Please place it in the mailbox as  soon as possible. Your physician should have your test results approximately 7 days after the  monitor has been mailed back to [facility].  Call [place] at [phone] if you have questions regarding  your ZIO XT patch monitor. Call  them immediately if you see an orange light blinking on your  monitor.  If your monitor falls off in less than 4 days, contact our Monitor department at [phone].  If your monitor becomes loose or falls off after 4 days call [employer] at [phone] for  suggestions on securing your monitor    Follow-Up: At [facility], you and your health needs are our priority.  As part of our continuing mission to provide you with exceptional heart care, we have created designated Provider Care Teams.  These Care Teams include your primary Cardiologist (physician) and Advanced Practice Providers (APPs -  Physician Assistants and Nurse Practitioners) who all work together to provide you with the care you need, when you need it.  We recommend signing up for the patient portal called "MyChart".  Sign up information is provided on this After Visit Summary.  MyChart is used to connect with patients for Virtual Visits (Telemedicine).  Patients are able to view lab/test results, encounter notes, upcoming appointments, etc.  Non-urgent messages can be sent to your provider as well.   To learn more about what you can do with MyChart, go to [email or web address].    Your next appointment:   6 month(s)  The format for your next appointment:   In Person  Provider:   Dr. Johney Frame

## 2022-03-20 NOTE — Progress Notes (Signed)
Cardiology Office Note:    Date:  03/20/2022   ID:  Samantha [name], DOB Mar 21, 1946, MRN [id number]  PCP:  Tonia Ghent, MD   [facility] Providers Cardiologist:  None {  Referring MD: Tonia Ghent, MD     History of Present Illness:    Samantha [name] is a 76 y.o. female with a hx of HTN, HLD asthma, GERD and fibromyalgia who was previously followed by Dr. Debara Pickett who was re-referred to clinic by Dr. Damita Dunnings for further evaluation of chest discomfort.  Patient was last seen in clinic in 04/2018 by Dr. Debara Pickett. Had coronary CTA 04/2018 which showed no evidence of CAD. Ca score 0.   Today, she is accompanied with her daughter. Recently has been having chest discomfort, exertional shortness of breath, brain fog, and loss of balance. She has been evaluated in the ER for the episodes of brain fog and difficulty with speech with MRI head unremarkable. She has planned follow-up with Neurology.  From a dizziness standpoint, the patient states this has been ongoing for years. Symptoms seem to be worsening and she can feel dizzy frequently throughout the day. No associated chest pain, syncope or SOB. States she sometimes notes that her diastolic blood pressure is in the 50-60s when she feels dizzy. SBP has been 140.   She also reports significant chest pain that has both exertional and nonexertional components. This has also been ongoing for years but seems to be worsening. CTA in 2019 with no disease and Ca score 0.   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   Beclomethasone Dipropionate (QNASL) 80 MCG/ACT AERS Place 2 sprays into both nostrils daily.   bifidobacterium infantis (ALIGN) capsule Take 1 capsule by mouth daily.   budesonide-formoterol (SYMBICORT) 160-4.5 MCG/ACT inhaler Inhale 2 puffs into the lungs 2 (two) times daily. Rinse mouth   cetirizine (ZYRTEC) 10 MG tablet Take 10 mg by mouth at bedtime.    Cholecalciferol (VITAMIN D) 50 MCG (2000 UT) CAPS Take 1 capsule by mouth daily.   Coenzyme Q10 (CO Q 10) 60 MG CAPS 1 capsule with a meal   cromolyn (NASALCROM) 5.2 MG/ACT nasal spray Place 1 spray into both nostrils 2 (two) times daily as needed for allergies.   hydrochlorothiazide (HYDRODIURIL) 12.5 MG tablet Take 0.5-1 tablets (6.25-12.5 mg total) by mouth daily.   levalbuterol (XOPENEX HFA) 45 MCG/ACT inhaler Inhale  1-2 puffs into the lungs every 6 (six) hours as needed for wheezing.   levalbuterol (XOPENEX) 1.25 MG/3ML nebulizer solution Take 1.25 mg by nebulization every 6 (six) hours as needed for wheezing.   losartan (COZAAR) 50 MG tablet Take 1 tablet (50 mg total) by mouth daily.   Magnesium Malate POWD ('1250mg'$  tabs) 1 tablet by mouth qhs   Manganese 50 MG TABS Take 50 mg by mouth at bedtime.    montelukast (SINGULAIR) 10 MG tablet Take 1 tablet (10 mg total) by mouth at bedtime.   Olopatadine HCl (PATADAY) 0.2 % SOLN Place 1 drop into  both eyes daily as needed (itchy eyes).   pantoprazole (PROTONIX) 40 MG tablet Take 1 tablet (40 mg total) by mouth 2 (two) times daily.   Polyethyl Glycol-Propyl Glycol (SYSTANE) 0.4-0.3 % GEL ophthalmic gel Place 1 application into both eyes every 6 (six) hours as needed (dry eyes).   Pseudoephedrine-Guaifenesin [phone] MG TB12 Take 1,200 mg by mouth as needed.   TART CHERRY PO Take by mouth. Take 1 tablet one time a day by mouth   thiamine (VITAMIN B-1) 50 MG tablet Take 100 mg by mouth daily.   TURMERIC PO Take 1 tablet by mouth daily.     Allergies:   2,4-d dimethylamine; Albuterol; Cefuroxime axetil; Doxycycline; Iodinated contrast media; Latex; Lisinopril; Lovastatin; Other; and Sulfonamide derivatives   Social History   Socioeconomic History   Marital status: Widowed    Spouse name: Not on file   Number of children: 3   Years of education: some college   Highest education level: Not on file  Occupational History    Employer: RETIRED    Comment: Retired Stage manager)  Tobacco Use   Smoking status: Never    Passive exposure: Never   Smokeless tobacco: Never  Vaping Use   Vaping Use: Never used  Substance and Sexual Activity   Alcohol use: No   Drug use: No   Sexual activity: Never  Other Topics Concern   Not on file  Social History Narrative   Lives alone (widowed 1998).   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      2 sons and 1 daughter.        Right-handed.   No daily caffeine use.   Social Determinants of Health   Financial Resource Strain: Low Risk  (07/12/2020)   Overall Financial Resource Strain (CARDIA)    Difficulty of Paying Living Expenses: Not hard at all  Food Insecurity: No Food Insecurity (07/12/2020)   Hunger Vital Sign    Worried About Running Out of Food in the Last Year: Never true    Ran Out of Food in the Last Year: Never true  Transportation Needs: No Transportation Needs (07/12/2020)   PRAPARE - Radiographer, therapeutic (Medical): No    Lack of Transportation (Non-Medical): No  Physical Activity: Insufficiently Active (07/12/2020)   Exercise Vital Sign    Days of Exercise per Week: 2 days    Minutes of Exercise per Session: 60 min  Stress: No Stress Concern Present (07/12/2020)   [place]    Feeling of Stress : Not at all  Social Connections: Not on file     Family History: The patient's family history includes Allergic rhinitis in her brother, father, maternal aunt, paternal aunt, and sister; Asthma in her brother, father, maternal aunt, paternal aunt, and sister; Breast cancer in her cousin;  Colon cancer in her cousin; Diabetes in her mother; Glaucoma in her mother; Heart disease in her father; Hypertension in her mother; Other in her father; Polymyositis in her mother; Prostate cancer in her brother. There is no history of Cancer, Angioedema, Atopy, Immunodeficiency, Urticaria, or Eczema.  ROS:   Please see the history of present illness.    (+) exertional Shortness of breath  (+) Chest discomfort (+) Headaches (+) Palpitations (+) Loss of Balance (+) Brain Fog (+) LE Edema All other systems reviewed and are negative.  EKGs/Labs/Other Studies Reviewed:    The following studies were reviewed today: CTA Coronaries 04/2018: FINDINGS: A 120 kV prospective scan was triggered in the descending thoracic aorta at 111 HU's. Axial non-contrast 3 mm slices were carried out through the heart. The data set was analyzed on a dedicated work station and scored using the [place]. Gantry rotation speed was 250 msecs and collimation was .6 mm. No beta blockade and 0.8 mg of sl NTG was given. The 3D data set was reconstructed in 5% intervals of the 67-82 % of the R-R cycle. Diastolic phases were analyzed on a dedicated work station using MPR, MIP and VRT modes. The patient received 80 cc of contrast.   Aorta:  Normal size.   No calcifications.  No dissection.   Aortic Valve:  Trileaflet.  No calcifications.   Coronary Arteries:  Normal coronary origin. Left dominance.   Left main is a large and long artery that gives rise to LAD and LCX arteries. Left main has no plaque.   LAD is a large vessel that gives rise to one diagonal artery, wraps around the apex and has no plaque.   LCX is a large dominant artery that gives rise to two OM branches, PDA and PLA. There is no plaque.   Other findings:   Normal pulmonary vein drainage into the left atrium.   Normal let atrial appendage without a thrombus.   Normal size of the pulmonary artery.   IMPRESSION: 1. Coronary calcium score of 0. This was 0 percentile for age and sex matched control.   2. Normal coronary origin with left dominance.   3. No evidence of CAD.  Consider non-cardiac sources of chest pain.    EKG:  EKG is personally reviewed.  03/20/2022 EKG: No new tracing   Recent Labs: 10/20/2021: TSH 3.77 02/26/2022: ALT 13; BUN 15; Creatinine, Ser 0.80; Hemoglobin 12.2; Platelets 190; Potassium 3.4; Sodium 139  Recent Lipid Panel    Component Value Date/Time   CHOL 223 (H) 10/20/2021 0938   TRIG 90.0 10/20/2021 0938   HDL 104.50 10/20/2021 0938   CHOLHDL 2 10/20/2021 0938   VLDL 18.0 10/20/2021 0938   LDLCALC 100 (H) 10/20/2021 0938     Risk Assessment/Calculations:           Physical Exam:    VS:  BP 140/70   Pulse 70   Ht '5\' 2"'$  (1.575 m)   Wt 128 lb 12.8 oz (58.4 kg)   SpO2 98%   BMI 23.56 kg/m     Wt Readings from Last 3 Encounters:  03/20/22 128 lb 12.8 oz (58.4 kg)  03/02/22 133 lb (60.3 kg)  02/26/22 133 lb (60.3 kg)     GEN:  Well nourished, well developed in no acute distress HEENT: Normal NECK: No JVD; No carotid bruits CARDIAC: RRR, 1/6 systolic murmur, rubs, gallops RESPIRATORY:  Clear to auscultation without rales, wheezing or rhonchi  ABDOMEN: Soft, non-tender, non-distended MUSCULOSKELETAL:  No  edema;  No deformity  SKIN: Warm and dry NEUROLOGIC:  Alert and oriented x 3 PSYCHIATRIC:  Normal affect   ASSESSMENT:    1. Dizziness   2. Lightheadedness   3. Chest pain of uncertain etiology   4. Palpitations    PLAN:    In order of problems listed above:  #Chest Pain: Do not suspect cardiac in nature given coronary CTA in 2019 with no evidence of CAD. Ca score 0. Symptoms seem to be both exertional and nonexertional. We can repeat TTE to ensure stable. Could also consider trial of amlodipine or imdur if there is element of vasospasm. May also be related to fibromyalgia and GERD and encouraged her to continue management of these.  -Check TTE  #Dizziness: Patient complains of intermittent dizziness that has been ongoing for years. Symptoms seem to be worsening recently. States her DBP is lower when this is occurring although wouldn't expect this to lead to her degree of dizziness with SBP 140. Will check TTE as above and zio monitor. She has neurology follow-up. -Check TTE as above -Check 3 day zio -Follow-up with Neuro scheduled  #HTN: Running 140s at home. Given symptoms of dizziness, will not increase at this time and continue to monitor.  -Continue HCTZ 12.'5mg'$  daily -Continue losartan '50mg'$  daily         Follow up 6 months  Medication Adjustments/Labs and Tests Ordered: Current medicines are reviewed at length with the patient today.  Concerns regarding medicines are outlined above.  Orders Placed This Encounter  Procedures   LONG TERM MONITOR (3-14 DAYS)   ECHOCARDIOGRAM COMPLETE   No orders of the defined types were placed in this encounter.   Patient Instructions  Medication Instructions:   Your physician recommends that you continue on your current medications as directed. Please refer to the Current Medication list given to you today.  *If you need a refill on your cardiac medications before your next appointment, please call your  pharmacy*   Testing/Procedures:  Your physician has requested that you have an echocardiogram. Echocardiography is a painless test that uses sound waves to create images of your heart. It provides your doctor with information about the size and shape of your heart and how well your heart's chambers and valves are working. This procedure takes approximately one hour. There are no restrictions for this procedure.   ZIO XT- Long Term Monitor Instructions  Your physician has requested you wear a ZIO patch monitor for 3  days.  This is a single patch monitor. [employer] supplies one patch monitor per enrollment. Additional stickers are not available. Please do not apply patch if you will be having a Nuclear Stress Test,  Echocardiogram, Cardiac CT, MRI, or Chest Xray during the period you would be wearing the  monitor. The patch cannot be worn during these tests. You cannot remove and re-apply the  ZIO XT patch monitor.  Your ZIO patch monitor will be mailed 3 day USPS to your address on file. It may take 3-5 days  to receive your monitor after you have been enrolled.  Once you have received your monitor, please review the enclosed instructions. Your monitor  has already been registered assigning a specific monitor serial # to you.  Billing and Patient Assistance Program Information  We have supplied [employer] with any of your insurance information on file for billing purposes. [employer] offers a sliding scale Patient Assistance Program for patients that do not have  insurance, or whose insurance does not completely  cover the cost of the ZIO monitor.  You must apply for the Patient Assistance Program to qualify for this discounted rate.  To apply, please call [employer] at [phone], select option 4, select option 2, ask to apply for  Patient Assistance Program. Theodore Demark will ask your household income, and how many people  are in your household. They will quote your out-of-pocket cost based on that  information.  [employer] will also be able to set up a 68-month interest-free payment plan if needed.  Applying the monitor   Shave hair from upper left chest.  Hold abrader disc by orange tab. Rub abrader in 40 strokes over the upper left chest as  indicated in your monitor instructions.  Clean area with 4 enclosed alcohol pads. Let dry.  Apply patch as indicated in monitor instructions. Patch will be placed under collarbone on left  side of chest with arrow pointing upward.  Rub patch adhesive wings for 2 minutes. Remove white label marked "1". Remove the white  label marked "2". Rub patch adhesive wings for 2 additional minutes.  While looking in a mirror, press and release button in center of patch. A small green light will  flash 3-4 times. This will be your only indicator that the monitor has been turned on.  Do not shower for the first 24 hours. You may shower after the first 24 hours.  Press the button if you feel a symptom. You will hear a small click. Record Date, Time and  Symptom in the Patient Logbook.  When you are ready to remove the patch, follow instructions on the last 2 pages of Patient  Logbook. Stick patch monitor onto the last page of Patient Logbook.  Place Patient Logbook in the blue and white box. Use locking tab on box and tape box closed  securely. The blue and white box has prepaid postage on it. Please place it in the mailbox as  soon as possible. Your physician should have your test results approximately 7 days after the  monitor has been [facility] back to [facility]  Call [facility]at [phone] you have questions regarding  your ZIO XT patch monitor. Call them immediately if you see an orange light blinking on your  monitor.  If your monitor falls off in less than 4 days, contact our Monitor department at [phone]  If your monitor becomes loose or falls off after 4 days call [employer] at [phone]  suggestions on  securing your monitor    Follow-Up: [facility] you and your health needs are our priority.  As part of our continuing mission to provide you with exceptional heart care, we have created designated Provider Care Teams.  These Care Teams include your primary Cardiologist (physician) and Advanced Practice Providers (APPs -  Physician Assistants and Nurse Practitioners) who all work together to provide you with the care you need, when you need it.  We recommend signing up for the patient portal called "MyChart".  Sign up information is provided on this After Visit Summary.  MyChart is used to connect with patients for Virtual Visits (Telemedicine).  Patients are able to view lab/test results, encounter notes, upcoming appointments, etc.  Non-urgent messages can be sent to your provider as well.   To learn more about what you can do with MyChart, go to [email or web address]    Your next appointment:   6 month(s)  The format for your next appointment:   In Person  Provider:   Dr.  Maryjane Benedict            I,Tinashe Williams,acting as a Education administrator for Freada Bergeron, MD.,have documented all relevant documentation on the behalf of Freada Bergeron, MD,as directed by  Freada Bergeron, MD while in the presence of Freada Bergeron, MD.    I, Freada Bergeron, MD, have reviewed all documentation for this visit. The documentation on 03/20/22 for the exam, diagnosis, procedures, and orders are all accurate and complete.

## 2022-03-20 NOTE — Progress Notes (Unsigned)
Enrolled for [employer] to mail a ZIO XT long term holter monitor to the patients address on file.  

## 2022-03-26 ENCOUNTER — Ambulatory Visit: Payer: Medicare Other | Admitting: Nurse Practitioner

## 2022-03-27 ENCOUNTER — Encounter: Payer: Self-pay | Admitting: Family Medicine

## 2022-03-27 ENCOUNTER — Telehealth (INDEPENDENT_AMBULATORY_CARE_PROVIDER_SITE_OTHER): Payer: Medicare Other | Admitting: Family Medicine

## 2022-03-27 DIAGNOSIS — J019 Acute sinusitis, unspecified: Secondary | ICD-10-CM | POA: Diagnosis not present

## 2022-03-27 MED ORDER — FLUCONAZOLE 150 MG PO TABS: 150.0000 mg | ORAL_TABLET | ORAL | 0 refills | Status: DC

## 2022-03-27 MED ORDER — AMOXICILLIN-POT CLAVULANATE 875-125 MG PO TABS: 1.0000 | ORAL_TABLET | Freq: Two times a day (BID) | ORAL | 0 refills | Status: DC

## 2022-03-27 MED ORDER — PREDNISONE 20 MG PO TABS: ORAL_TABLET | ORAL | 0 refills | Status: DC

## 2022-03-27 NOTE — Progress Notes (Unsigned)
Interactive audio and video telecommunications were attempted between this provider and patient, however failed, due to patient having technical difficulties OR patient did not have access to video capability.  We continued and completed visit with audio only.   Virtual Visit via Telephone Note  I connected with patient on 03/27/22  at 3:26 PM  by telephone and verified that I am speaking with the correct person using two identifiers.  Location of patient: home   Location of MD: [place] Name of referring provider (if blank then none associated): Names per persons and role in encounter:  MD: Earlyne Iba, Patient: name listed above.    I discussed the limitations, risks, security and privacy concerns of performing an evaluation and management service by telephone and the availability of in person appointments. I also discussed with the patient that there may be a patient responsible charge related to this service. The patient expressed understanding and agreed to proceed.  CC: URI sx   History of Present Illness:   Sx started last week, about 7 days ago.  Head congestion.  Then cough, hoarse voice, sinus pressure.  Not having facial pain right now but had it prev.  No fevers.  Taking OTC cough meds in the meantime.  No sputum.  Post nasal gtt/throat clearing.  No known wheeze heard by patient.  Using inhalers at baseline.  Can still take a deep breath.  Fatigue.  She isn't lightheaded.  No fevers.    We talked about getting her gyn appointment and cardiac monitor and echo done prior to starting PT.     Observations/Objective: Hoarse voice but not in resp distress.     Assessment and Plan:   Presumed sinusitis.   We talked about tx options.   We will take prednisone routine cautions and start Augmentin.  She can use Diflucan if she has a yeast infection.  Supportive care otherwise and update me as needed.  She agrees to plan  Follow Up Instructions: see above.    I discussed  the assessment and treatment plan with the patient. The patient was provided an opportunity to ask questions and all were answered. The patient agreed with the plan and demonstrated an understanding of the instructions.   The patient was advised to call back or seek an in-person evaluation if the symptoms worsen or if the condition fails to improve as anticipated.  I provided 13 minutes of non-face-to-face time during this encounter.  Elsie Stain, MD

## 2022-03-28 DIAGNOSIS — R002 Palpitations: Secondary | ICD-10-CM | POA: Diagnosis not present

## 2022-03-28 DIAGNOSIS — R42 Dizziness and giddiness: Secondary | ICD-10-CM | POA: Diagnosis not present

## 2022-03-28 DIAGNOSIS — R079 Chest pain, unspecified: Secondary | ICD-10-CM

## 2022-03-28 NOTE — Assessment & Plan Note (Signed)
Presumed sinusitis.   We talked about tx options.   We will take prednisone routine cautions and start Augmentin.  She can use Diflucan if she has a yeast infection.  Supportive care otherwise and update me as needed.  She agrees to plan

## 2022-04-02 ENCOUNTER — Telehealth: Payer: Self-pay | Admitting: Family Medicine

## 2022-04-02 DIAGNOSIS — Z6824 Body mass index (BMI) 24.0-24.9, adult: Secondary | ICD-10-CM | POA: Diagnosis not present

## 2022-04-02 NOTE — Telephone Encounter (Signed)
Patient called and stated she has an appointment with the neurologist on 05/07/2022 and she wanted if she can get it earlier on 04/10/2022. Also stated can it be anywhere. Call back [phone]

## 2022-04-03 ENCOUNTER — Encounter: Payer: Self-pay | Admitting: Family Medicine

## 2022-04-03 NOTE — Telephone Encounter (Signed)
Patient called about her referral, she stated that she wants to make sure her referral is correct. Call back number [phone].

## 2022-04-03 NOTE — Telephone Encounter (Signed)
Patient called already about this and I sent to [place] to help with. Any thoughts from either one of you if patient would be able to be seen sooner elsewhere?

## 2022-04-05 ENCOUNTER — Telehealth: Payer: Self-pay | Admitting: Rheumatology

## 2022-04-05 DIAGNOSIS — R002 Palpitations: Secondary | ICD-10-CM | POA: Diagnosis not present

## 2022-04-05 DIAGNOSIS — R079 Chest pain, unspecified: Secondary | ICD-10-CM | POA: Diagnosis not present

## 2022-04-05 DIAGNOSIS — R42 Dizziness and giddiness: Secondary | ICD-10-CM | POA: Diagnosis not present

## 2022-04-05 NOTE — Telephone Encounter (Signed)
Please see if neurology can move her appointment sooner.  Thanks.

## 2022-04-05 NOTE — Telephone Encounter (Signed)
Patient left a voicemail stating she is scheduled for an appointment with the neurologist on 05/07/22 and needs to be seen before then.  Patient is requesting Dr. Estanislado Pandy get her an earlier appointment.  Patient also stated she had to go to the ER in June because of some problems she was having and wants to discuss them with her.  Patient states there is too much information to leave on the answering machine and requested the nurse call back.

## 2022-04-06 NOTE — Telephone Encounter (Signed)
Spoke with patient and she states she was seen in the emergency roon on 02/25/2022 for concern of a possible stroke. Patient was advised to see cardiology and neurology. Patient states she has already seen cardiology. Patient states she has an appointment scheduled for 05/07/2022 for neurology. She wanted to know if Dr. Estanislado Pandy could get her seen sooner. Patient advised she would need to reach out to PCP as they are the ones who replaced referral.

## 2022-04-06 NOTE — Telephone Encounter (Signed)
I have no specific questions.

## 2022-04-10 DIAGNOSIS — Z803 Family history of malignant neoplasm of breast: Secondary | ICD-10-CM | POA: Diagnosis not present

## 2022-04-17 DIAGNOSIS — H40003 Preglaucoma, unspecified, bilateral: Secondary | ICD-10-CM | POA: Diagnosis not present

## 2022-04-19 ENCOUNTER — Ambulatory Visit (INDEPENDENT_AMBULATORY_CARE_PROVIDER_SITE_OTHER): Payer: Medicare Other

## 2022-04-19 DIAGNOSIS — R002 Palpitations: Secondary | ICD-10-CM | POA: Diagnosis not present

## 2022-04-19 DIAGNOSIS — R42 Dizziness and giddiness: Secondary | ICD-10-CM

## 2022-04-19 DIAGNOSIS — R079 Chest pain, unspecified: Secondary | ICD-10-CM | POA: Diagnosis not present

## 2022-04-19 LAB — ECHOCARDIOGRAM COMPLETE
AR max vel: 2.27 cm2
AV Area VTI: 2.19 cm2
AV Area mean vel: 2.26 cm2
AV Mean grad: 4 mmHg
AV Peak grad: 7.7 mmHg
Ao pk vel: 1.39 m/s
Area-P 1/2: 2.08 cm2
Calc EF: 53.8 %
S' Lateral: 2.2 cm
Single Plane A2C EF: 51.1 %
Single Plane A4C EF: 56.8 %

## 2022-04-20 ENCOUNTER — Telehealth: Payer: Self-pay | Admitting: Family Medicine

## 2022-04-20 MED ORDER — MONTELUKAST SODIUM 10 MG PO TABS: 10.0000 mg | ORAL_TABLET | Freq: Every day | ORAL | 3 refills | Status: DC

## 2022-04-20 NOTE — Telephone Encounter (Signed)
Erx sent

## 2022-04-20 NOTE — Addendum Note (Signed)
Addended by: Sherrilee Gilles B on: 04/20/2022 02:20 PM   Modules accepted: Orders

## 2022-04-20 NOTE — Telephone Encounter (Signed)
  Encourage patient to contact the pharmacy for refills or they can request refills through [facility]  Did the patient contact the pharmacy: No  LAST APPOINTMENT DATE: 03/27/22  NEXT APPOINTMENT DATE: N/A  MEDICATION: montelukast (SINGULAIR) 10 MG tablet  Is the patient out of medication? No  If not, how much is left? Not many left  Is this a 90 day supply: Yes, with 3 refills  PHARMACY: [facility], [address]  Let patient know to contact pharmacy at the end of the day to make sure medication is ready.  Please notify patient to allow 48-72 hours to process

## 2022-04-22 ENCOUNTER — Other Ambulatory Visit: Payer: Self-pay | Admitting: Family Medicine

## 2022-05-07 ENCOUNTER — Ambulatory Visit (INDEPENDENT_AMBULATORY_CARE_PROVIDER_SITE_OTHER): Payer: Medicare Other | Admitting: Neurology

## 2022-05-07 ENCOUNTER — Encounter: Payer: Self-pay | Admitting: Neurology

## 2022-05-07 VITALS — BP 179/65 | HR 77 | Ht 62.0 in | Wt 135.0 lb

## 2022-05-07 DIAGNOSIS — R5383 Other fatigue: Secondary | ICD-10-CM

## 2022-05-07 DIAGNOSIS — R4189 Other symptoms and signs involving cognitive functions and awareness: Secondary | ICD-10-CM | POA: Diagnosis not present

## 2022-05-07 NOTE — Progress Notes (Signed)
Chief Complaint  Patient presents with   New Patient (Initial Visit)    Room 14, alone Dr. Orson Aloe 2021/internal referral for Paresthesia Numbness and pain on left side,  States sx stopped a few weeks ago  Concerned about small strokes, today c/o headaches      ASSESSMENT AND PLAN  Samantha [name] is a 76 y.o. female   Cognitive impairment  MoCA examination 23/30  Family history of memory loss  MRI of the brain showed no significant abnormalities  Laboratory evaluation to rule out treatable etiology  At risk for obstructive sleep apnea  Daily headache, difficulty falling to sleep, staying asleep, snoring, daytime sleepiness, fatigue  Discussed with patient and her daughter, agree sleep study  DIAGNOSTIC DATA (LABS, IMAGING, TESTING) - I reviewed patient records, labs, notes, testing and imaging myself where available. Laboratory evaluation showed normal negative ESR, C-reactive protein, CBC, CMP, with mild elevated glucose 134,  MEDICAL HISTORY:  Samantha [name] is a 76 year old female seen in request by primary care doctor Elsie Stain for evaluation of word finding difficulties, confusion, I was able to talk with her daughter Colletta [place] on the phone,  I reviewed and summarized the referring note. PMHx. HTN Asthma HLD Right carpal tunnel syndromes   I saw her previously for right hand paresthesia, frequent headaches, since 2020,  EMG nerve conduction study in August 2020, confirmed the diagnosis of moderate right carpal tunnel syndromes  She complains almost daily headaches, CT head without contrast showed no significant abnormalities, mild small vessel disease  Multiple repeat ESR, C-reactive protein was within normal limit, she manage her headache by sleep, Tylenol as needed  She also carries a diagnosis of fibromyalgia I have suggested Cymbalta in the past, worry about the side effect, she did not try  She retired from [employer], lives alone, complains of excessive  daytime fatigue, sleepiness, poor sleep quality, already falling to sleep, difficulty staying asleep, does snore sometimes  She has slow worsening memory loss over the past few years, got lost while driving on the familiar route, misplace things, word finding difficulties, her mother did suffer memory loss in the 180s  She presented to the emergency room on February 25, 2022, after not function well for 2 days, word finding difficulties, lightheadedness, off-balance,  Personally reviewed MRI of the brain on February 26, 2022, no acute abnormality, age-appropriate changes  PHYSICAL EXAM:   Vitals:   05/07/22 1442  BP: (!) 179/65  Pulse: 77  Weight: 135 lb (61.2 kg)  Height: 5' 2" (1.575 m)   Not recorded     Body mass index is 24.69 kg/m.  PHYSICAL EXAMNIATION:  Gen: NAD, conversant, well nourised, well groomed                     Cardiovascular: Regular rate rhythm, no peripheral edema, warm, nontender. Eyes: Conjunctivae clear without exudates or hemorrhage Neck: Supple, no carotid bruits. Pulmonary: Clear to auscultation bilaterally   NEUROLOGICAL EXAM:  MENTAL STATUS: Speech/cognition: Awake, alert, oriented to history taking and casual conversation     05/07/2022    3:00 PM  Montreal Cognitive Assessment   Visuospatial/ Executive (0/5) 4  Naming (0/3) 3  Attention: Read list of digits (0/2) 2  Attention: Read list of letters (0/1) 1  Attention: Serial 7 subtraction starting at 100 (0/3) 1  Language: Repeat phrase (0/2) 2  Language : Fluency (0/1) 0  Abstraction (0/2) 2  Delayed Recall (0/5) 2  Orientation (0/6) 6  Total  23    CRANIAL NERVES: CN II: Visual fields are full to confrontation. Pupils are round equal and briskly reactive to light. CN III, IV, VI: extraocular movement are normal. No ptosis. CN V: Facial sensation is intact to light touch CN VII: Face is symmetric with normal eye closure  CN VIII: Hearing is normal to causal conversation. CN IX, X:  Phonation is normal. CN XI: Head turning and shoulder shrug are intact CN XII: Narrow oropharyngeal space  MOTOR: There is no pronator drift of out-stretched arms. Muscle bulk and tone are normal. Muscle strength is normal.  REFLEXES: Reflexes are 1 and symmetric at the biceps, triceps, knees, and ankles. Plantar responses are flexor.  SENSORY: Intact to light touch, pinprick and vibratory sensation are intact in fingers and toes.  COORDINATION: There is no trunk or limb dysmetria noted.  GAIT/STANCE: Get up from sitting position arm crossed steady gait    REVIEW OF SYSTEMS:  Full 14 system review of systems performed and notable only for as above All other review of systems were negative.   ALLERGIES: Allergies  Allergen Reactions   2,4-D Dimethylamine    Albuterol     Tachycardia   Cefuroxime Axetil     REACTION: pt not sure of reaction- she can tolerate amoxil   Doxycycline Itching   Iodinated Contrast Media Other (See Comments) and Itching   Latex Itching   Lisinopril     Cough   Lovastatin Other (See Comments)    Muscle aches    Sulfonamide Derivatives     REACTION: pt not sure of reaction    HOME MEDICATIONS: Current Outpatient Medications  Medication Sig Dispense Refill   albuterol (VENTOLIN HFA) 108 (90 Base) MCG/ACT inhaler Inhale 2 puffs into the lungs every 6 (six) hours as needed for wheezing or shortness of breath.     Beclomethasone Dipropionate (QNASL) 80 MCG/ACT AERS Place 2 sprays into both nostrils daily. 3 each 1   bifidobacterium infantis (ALIGN) capsule Take 1 capsule by mouth daily.     budesonide-formoterol (SYMBICORT) 160-4.5 MCG/ACT inhaler Inhale 2 puffs into the lungs 2 (two) times daily. Rinse mouth 3 each 4   cetirizine (ZYRTEC) 10 MG tablet Take 10 mg by mouth at bedtime.      Cholecalciferol (VITAMIN D) 50 MCG (2000 UT) CAPS Take 1 capsule by mouth daily.     Coenzyme Q10 (CO Q 10) 60 MG CAPS 1 capsule with a meal     cromolyn  (NASALCROM) 5.2 MG/ACT nasal spray Place 1 spray into both nostrils 2 (two) times daily as needed for allergies.     hydrochlorothiazide (HYDRODIURIL) 12.5 MG tablet TAKE 1/2 TO 1 TABLET(6.25 TO 12.5 MG) BY MOUTH DAILY 90 tablet 2   levalbuterol (XOPENEX HFA) 45 MCG/ACT inhaler Inhale 1-2 puffs into the lungs every 6 (six) hours as needed for wheezing. 1 each 12   levalbuterol (XOPENEX) 1.25 MG/3ML nebulizer solution Take 1.25 mg by nebulization every 6 (six) hours as needed for wheezing. 72 mL 12   losartan (COZAAR) 50 MG tablet Take 1 tablet (50 mg total) by mouth daily. 90 tablet 3   Magnesium Malate POWD (1251m tabs) 1 tablet by mouth qhs     Manganese 50 MG TABS Take 50 mg by mouth at bedtime.      montelukast (SINGULAIR) 10 MG tablet Take 1 tablet (10 mg total) by mouth at bedtime. 90 tablet 3   Olopatadine HCl (PATADAY) 0.2 % SOLN Place 1 drop into both eyes  daily as needed (itchy eyes).     pantoprazole (PROTONIX) 40 MG tablet Take 1 tablet (40 mg total) by mouth 2 (two) times daily. 180 tablet 3   Polyethyl Glycol-Propyl Glycol (SYSTANE) 0.4-0.3 % GEL ophthalmic gel Place 1 application into both eyes every 6 (six) hours as needed (dry eyes).     TART CHERRY PO Take by mouth. Take 1 tablet one time a day by mouth     thiamine (VITAMIN B-1) 50 MG tablet Take 100 mg by mouth daily.     TURMERIC PO Take 1 tablet by mouth daily.     No current facility-administered medications for this visit.    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    PAST SURGICAL HISTORY: Past Surgical History:  Procedure Laterality Date   COLONOSCOPY W/ POLYPECTOMY     ELECTROCARDIOGRAM  12/04/2006   IR RADIOLOGY PERIPHERAL GUIDED IV START  04/21/2018   IR US GUIDE VASC ACCESS LEFT  04/21/2018   NASAL SEPTUM SURGERY     Stress Cardiolite  02/13/2002    FAMILY HISTORY: Family History  Problem Relation Age of Onset   Diabetes Mother    Hypertension Mother    Glaucoma Mother    Polymyositis Mother    Heart disease Father        CHF   Allergic rhinitis Father    Asthma Father    Other Father        sideoblastic anemia   Allergic rhinitis Sister    Asthma Sister    Allergic rhinitis Brother    Asthma Brother    Prostate cancer Brother    Allergic rhinitis Maternal Aunt    Asthma Maternal Aunt    Allergic rhinitis Paternal Aunt    Asthma Paternal Aunt    Breast cancer Cousin    Colon cancer Cousin    Cancer Neg Hx        No FH of Colon Cancer   Angioedema Neg Hx    Atopy Neg Hx    Immunodeficiency Neg Hx    Urticaria Neg Hx    Eczema Neg Hx     SOCIAL HISTORY: Social History   Socioeconomic History   Marital status: Widowed    Spouse name: Not on file   Number of children: 3   Years of education: some college   Highest education level: Not on file  Occupational History    Employer: RETIRED    Comment: Retired Stage manager)  Tobacco Use   Smoking status: Never    Passive exposure: Never   Smokeless tobacco: Never  Vaping Use   Vaping Use: Never used  Substance and Sexual Activity   Alcohol use: No   Drug use: No   Sexual activity: Never  Other Topics Concern   Not on file  Social History Narrative   Lives alone (widowed 1998).   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  2 sons and 1 daughter.        Right-handed.   No daily caffeine use.   Social Determinants of Health   Financial  Resource Strain: Low Risk  (07/12/2020)   Overall Financial Resource Strain (CARDIA)    Difficulty of Paying Living Expenses: Not hard at all  Food Insecurity: No Food Insecurity (07/12/2020)   Hunger Vital Sign    Worried About Running Out of Food in the Last Year: Never true    Ran Out of Food in the Last Year: Never true  Transportation Needs: No Transportation Needs (07/12/2020)   PRAPARE - Hydrologist (Medical): No    Lack of Transportation (Non-Medical): No  Physical Activity: Insufficiently Active (07/12/2020)   Exercise Vital Sign    Days of Exercise per Week: 2 days    Minutes of Exercise per Session: 60 min  Stress: No Stress Concern Present (07/12/2020)   [place]    Feeling of Stress : Not at all  Social Connections: Not on file  Intimate Partner Violence: Not At Risk (07/12/2020)   Humiliation, Afraid, Rape, and Kick questionnaire    Fear of Current or Ex-Partner: No    Emotionally Abused: No    Physically Abused: No    Sexually Abused: No      Marcial Pacas, M.D. Ph.D.  [facility] [address] [place], [place] [address] Ph: [phone] Fax: [phone]  CC:  Tonia Ghent, MD [place],  [place] [address]  Tonia Ghent, MD    Total time spent reviewing the chart, obtaining history, examined patient, ordering tests, documentation, consultations and family, care coordination was  56 minutes

## 2022-05-08 LAB — TSH: TSH: 3.14 u[IU]/mL (ref 0.450–4.500)

## 2022-05-08 LAB — VITAMIN B12: Vitamin B-12: 982 pg/mL (ref 232–1245)

## 2022-05-08 LAB — RPR: RPR Ser Ql: NONREACTIVE

## 2022-05-11 NOTE — Telephone Encounter (Signed)
Pt was seen 05/07/2022 by Dr Krista Blue, referred by Dr Krista Blue to Dr Rexene Alberts for Sleep Consult. This is scheduled to be done on 06/12/2022  Nothing further needed.

## 2022-05-18 ENCOUNTER — Other Ambulatory Visit (HOSPITAL_BASED_OUTPATIENT_CLINIC_OR_DEPARTMENT_OTHER): Payer: Self-pay

## 2022-06-04 ENCOUNTER — Telehealth: Payer: Self-pay | Admitting: Family Medicine

## 2022-06-04 NOTE — Telephone Encounter (Signed)
Spoke to patient by telephone and was advised that her blood pressure readings have been the following today 06/04/22 184/65, HR 62, later after medication 136/46 06/03/25 172/66 HR 68 before medicine 06/02/22 164/62 HR 71 before medicine 05/20/22 173/56 HR 56 before medicine 05/10/22 161/64 HR 75 before medication , at 4:00 pm 173/58 had taken her medication that morning. 04/18/22 150/63 HR 58 am afternoon it was 130/53 HR 67  Patient stated that she had a bad headache last night. Patient stated this morning she had a bad headache and headache has gotten better since taking her medication. Patient stated that her vision was a little blurry but that is better now also. Patient denies slurred speech, numbness or tingling. Patient was given ER precautions and she verbalized understanding.

## 2022-06-04 NOTE — Telephone Encounter (Signed)
Pt called stating the bp meds she's taking isn't working. losartan (COZAAR) 50 MG tablet. Pt stated the top # is increasing a lot & the bottom # isn't adjusted correctly either. Wants advice. Call back # is [phone]

## 2022-06-05 NOTE — Telephone Encounter (Signed)
Please verify her hydrochlorothiazide dose.  If she is only taking half of a 12.5 mg tablet then I would increase to 1 tablet (ie increased to 12.5 mg a day).   If she does that, then let me know if her blood pressure staying elevated.  If she has already increased to 12.5 mg of HCTZ then please update the chart and try taking 75 mg of losartan (1.5 of the '50mg'$  tabs).  Then let me know if her blood pressure is not controlled.  Thanks.

## 2022-06-06 NOTE — Telephone Encounter (Signed)
Spoke with patient about medication. She has been taking 1 tablet of HCTZ; I advised of Dr. Carole Civil directions for that. Patient verbalized understanding of all directions. Advised patient to let us know if that does not help her BP in a week.

## 2022-06-12 ENCOUNTER — Ambulatory Visit (INDEPENDENT_AMBULATORY_CARE_PROVIDER_SITE_OTHER): Payer: Medicare Other | Admitting: Neurology

## 2022-06-12 ENCOUNTER — Encounter: Payer: Self-pay | Admitting: Neurology

## 2022-06-12 VITALS — BP 146/67 | HR 65 | Ht 62.0 in | Wt 134.0 lb

## 2022-06-12 DIAGNOSIS — R4189 Other symptoms and signs involving cognitive functions and awareness: Secondary | ICD-10-CM

## 2022-06-12 DIAGNOSIS — G4719 Other hypersomnia: Secondary | ICD-10-CM | POA: Diagnosis not present

## 2022-06-12 DIAGNOSIS — G4700 Insomnia, unspecified: Secondary | ICD-10-CM | POA: Diagnosis not present

## 2022-06-12 DIAGNOSIS — R351 Nocturia: Secondary | ICD-10-CM

## 2022-06-12 DIAGNOSIS — R0683 Snoring: Secondary | ICD-10-CM | POA: Diagnosis not present

## 2022-06-12 DIAGNOSIS — Z820 Family history of epilepsy and other diseases of the nervous system: Secondary | ICD-10-CM

## 2022-06-12 DIAGNOSIS — R519 Headache, unspecified: Secondary | ICD-10-CM | POA: Diagnosis not present

## 2022-06-12 NOTE — Patient Instructions (Signed)

## 2022-06-12 NOTE — Progress Notes (Signed)
, Subjective:    Patient ID: Samantha [name] is a 76 y.o. female.  HPI    Star Age, MD, PhD [facility] [address], [address].O. [place], [place] [address]  Dear Samantha [name],   I saw your patient, Samantha [name], upon your kind request in my sleep clinic today for initial consultation of her sleep disorder, in particular, concern for underlying obstructive sleep apnea.  The patient is unaccompanied today.  As you know, Samantha [name] is a 76 year old female with an underlying medical history of allergic rhinitis, asthma, bronchiectasis, fibromyalgia, reflux disease, hypertension, hyperlipidemia, irritable bowel syndrome, osteoarthritis, Raynaud's disease, and cognitive impairment, who reports snoring and excessive daytime somnolence as well as recurrent headaches.  I reviewed your office note from 05/07/2022.  Her Epworth sleepiness score is 16 out of 24, fatigue severity score is 46 out of 63.  She does not sleep well at night.  She primarily has difficulty maintaining sleep for years.  She is widowed, she lives alone, she has 3 grown children.  She has 2 children in [place] but not close by and 1 in [place].  She is a non-smoker and does not drink any alcohol.  She drinks no caffeine typically, only decaf soda or tea occasionally.  She has occasionally woken up with a headache, she has nocturia about once or twice per average night.  Bedtime and rise time vary.  She may be in bed as early as 7:30 PM or 8:30 PM but does not fall asleep, she does watch TV while in bed.  She typically wakes up almost exactly at 1:40 AM every single night and has trouble going back to sleep, may doze off, typically stays in bed until 7 AM or as late as 10 AM.  She has a brother with sleep apnea, sister son also has sleep apnea and she has a cousin with sleep apnea.  She has no pets in the household.  She does not like to drive at night.  She does take naps almost every day, typically  after a meal.  She would be willing to get tested for sleep apnea and consider CPAP therapy.  She has seen cardiology in the past.  Her Past Medical History Is Significant For: Past Medical History:  Diagnosis Date   ALLERGIC RHINITIS 07/26/2008   ASTHMA 04/14/2007   ASYMPTOMATIC POSTMENOPAUSAL STATUS 05/10/2008   Bronchiectasis ([place])    CHEST PAIN 08/26/2008   COLONIC POLYPS, HX OF 04/14/2007   colonic leiomyoma   Decreased grip strength    Episcleritis    FIBROIDS, UTERUS 05/10/2008   Fibromyalgia    Gait abnormality    GERD 04/14/2007   HYPERCHOLESTEROLEMIA 01/07/2008   HYPERGLYCEMIA 11/28/2009   HYPERTENSION 01/28/2008   INSOMNIA 05/10/2008   Irritable bowel syndrome 04/06/2010   OSTEOARTHRITIS 11/28/2009   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    Her Past Surgical History Is Significant For: Past Surgical History:  Procedure Laterality Date   COLONOSCOPY W/ POLYPECTOMY     ELECTROCARDIOGRAM  12/04/2006   IR RADIOLOGY PERIPHERAL GUIDED IV START  04/21/2018   IR US GUIDE VASC ACCESS LEFT  04/21/2018   NASAL SEPTUM SURGERY     Stress Cardiolite  02/13/2002    Her Family History Is Significant For: Family History  Problem Relation Age of Onset   Diabetes Mother    Hypertension Mother    Glaucoma Mother    Polymyositis Mother    Heart disease Father  CHF   Allergic rhinitis Father    Asthma Father    Other Father        sideoblastic anemia   Allergic rhinitis Sister    Asthma Sister    Allergic rhinitis Brother    Asthma Brother    Prostate cancer Brother    Sleep apnea Brother    Allergic rhinitis Maternal Aunt    Asthma Maternal Aunt    Allergic rhinitis Paternal Aunt    Asthma Paternal Aunt    Breast cancer Cousin    Colon cancer Cousin    Cancer Neg Hx        No FH of Colon Cancer   Angioedema Neg Hx    Atopy Neg Hx    Immunodeficiency Neg Hx    Urticaria Neg Hx    Eczema Neg Hx     Her Social History Is Significant For: Social History    Socioeconomic History   Marital status: Widowed    Spouse name: Not on file   Number of children: 3   Years of education: some college   Highest education level: Not on file  Occupational History    Employer: RETIRED    Comment: Retired Stage manager)  Tobacco Use   Smoking status: Never    Passive exposure: Never   Smokeless tobacco: Never  Vaping Use   Vaping Use: Never used  Substance and Sexual Activity   Alcohol use: No   Drug use: No   Sexual activity: Never  Other Topics Concern   Not on file  Social History Narrative   Lives alone (widowed 1998).   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      2 sons and 1 daughter.        Right-handed.   No daily caffeine use.   Social Determinants of Health   Financial Resource Strain: Low Risk  (07/12/2020)   Overall Financial Resource Strain (CARDIA)    Difficulty of Paying Living Expenses: Not hard at all  Food Insecurity: No Food Insecurity (07/12/2020)   Hunger Vital Sign    Worried About Running Out of Food in the Last Year: Never true    Ran Out of Food in the Last Year: Never true  Transportation Needs: No Transportation Needs (07/12/2020)   PRAPARE - Hydrologist (Medical): No    Lack of Transportation (Non-Medical): No  Physical Activity: Insufficiently Active (07/12/2020)   Exercise Vital Sign    Days of Exercise per Week: 2 days    Minutes of Exercise per Session: 60 min  Stress: No Stress Concern Present (07/12/2020)   [place]    Feeling of Stress : Not at all  Social Connections: Not on file    Her Allergies Are:  Allergies  Allergen Reactions   2,4-D Dimethylamine    Albuterol     Tachycardia   Cefuroxime Axetil     REACTION: pt not sure of reaction- she can tolerate amoxil   Doxycycline Itching   Iodinated Contrast Media Other (See Comments) and Itching   Latex Itching   Lisinopril     Cough    Lovastatin Other (See Comments)    Muscle aches    Sulfonamide Derivatives     REACTION: pt not sure of reaction  :   Her Current Medications Are:  Outpatient Encounter Medications as of 06/12/2022  Medication Sig   albuterol (VENTOLIN HFA) 108 (90 Base) MCG/ACT  inhaler Inhale 2 puffs into the lungs every 6 (six) hours as needed for wheezing or shortness of breath.   Beclomethasone Dipropionate (QNASL) 80 MCG/ACT AERS Place 2 sprays into both nostrils daily.   bifidobacterium infantis (ALIGN) capsule Take 1 capsule by mouth daily.   budesonide-formoterol (SYMBICORT) 160-4.5 MCG/ACT inhaler Inhale 2 puffs into the lungs 2 (two) times daily. Rinse mouth   cetirizine (ZYRTEC) 10 MG tablet Take 10 mg by mouth at bedtime.    Cholecalciferol (VITAMIN D) 50 MCG (2000 UT) CAPS Take 1 capsule by mouth daily.   Coenzyme Q10 (CO Q 10) 60 MG CAPS 1 capsule with a meal   cromolyn (NASALCROM) 5.2 MG/ACT nasal spray Place 1 spray into both nostrils 2 (two) times daily as needed for allergies.   hydrochlorothiazide (HYDRODIURIL) 12.5 MG tablet TAKE 1/2 TO 1 TABLET(6.25 TO 12.5 MG) BY MOUTH DAILY   levalbuterol (XOPENEX HFA) 45 MCG/ACT inhaler Inhale 1-2 puffs into the lungs every 6 (six) hours as needed for wheezing.   levalbuterol (XOPENEX) 1.25 MG/3ML nebulizer solution Take 1.25 mg by nebulization every 6 (six) hours as needed for wheezing.   losartan (COZAAR) 50 MG tablet Take 1 tablet (50 mg total) by mouth daily.   Magnesium Malate POWD ('1250mg'$  tabs) 1 tablet by mouth qhs   Manganese 50 MG TABS Take 50 mg by mouth at bedtime.    montelukast (SINGULAIR) 10 MG tablet Take 1 tablet (10 mg total) by mouth at bedtime.   Olopatadine HCl (PATADAY) 0.2 % SOLN Place 1 drop into both eyes daily as needed (itchy eyes).   pantoprazole (PROTONIX) 40 MG tablet Take 1 tablet (40 mg total) by mouth 2 (two) times daily.   Polyethyl Glycol-Propyl Glycol (SYSTANE) 0.4-0.3 % GEL ophthalmic gel Place 1 application into  both eyes every 6 (six) hours as needed (dry eyes).   TART CHERRY PO Take by mouth. Take 1 tablet one time a day by mouth   thiamine (VITAMIN B-1) 50 MG tablet Take 100 mg by mouth daily.   TURMERIC PO Take 1 tablet by mouth daily.   No facility-administered encounter medications on file as of 06/12/2022.  :   Review of Systems:  Out of a complete 14 point review of systems, all are reviewed and negative with the exception of these symptoms as listed below:  Review of Systems  Neurological:        Pt here sleep consult Pt sores,fatigue ,headaches,hypertension Pt denies sleep study,CPAP machine     ESS;16  FSS:46    Objective:  Neurological Exam  Physical Exam Physical Examination:   Vitals:   06/12/22 1127  BP: (!) 146/67  Pulse: 65    General Examination: The patient is a very pleasant 76 y.o. female in no acute distress. She appears well-developed and well-nourished and well groomed.   HEENT: Normocephalic, atraumatic, pupils are equal, round and reactive to light, extraocular tracking is good without limitation to gaze excursion or nystagmus noted. Hearing is grossly intact. Face is symmetric with normal facial animation. Speech is clear with no dysarthria noted. There is no hypophonia. There is no lip, neck/head, jaw or voice tremor. Neck is supple with full range of passive and active motion. There are no carotid bruits on auscultation. Oropharynx exam reveals: mild mouth dryness, adequate dental hygiene with bridge on top, and the front teeth, moderate airway crowding secondary to small airway entry, redundant soft palate, Mallampati class III.  Tonsils on the smaller side, neck circumference 14 inches.  Tongue protrudes centrally and palate elevates symmetrically, minimal overbite.   Chest: Clear to auscultation without wheezing, rhonchi or crackles noted.  Heart: S1+S2+0, regular and normal without murmurs, rubs or gallops noted.   Abdomen: Soft, non-tender and  non-distended.  Extremities: There is no pitting edema in the distal lower extremities bilaterally.   Skin: Warm and dry without trophic changes noted.   Musculoskeletal: exam reveals no obvious joint deformities.   Neurologically:  Mental status: The patient is awake, alert and oriented in all 4 spheres. Her immediate and remote memory, attention, language skills and fund of knowledge are appropriate. There is no evidence of aphasia, agnosia, apraxia or anomia. Speech is clear with normal prosody and enunciation. Thought process is linear. Mood is normal and affect is normal.  Cranial nerves II - XII are as described above under HEENT exam.  Motor exam: Normal bulk, strength and tone is noted. There is no obvious action or resting tremor.  Fine motor skills and coordination: grossly intact.  Cerebellar testing: No dysmetria or intention tremor. There is no truncal or gait ataxia.  Sensory exam: intact to light touch in the upper and lower extremities.  Gait, station and balance: She stands easily. No veering to one side is noted. No leaning to one side is noted. Posture is age-appropriate and stance is narrow based. Gait shows normal stride length and normal pace. No problems turning are noted.   Assessment and Plan:  In summary, CARLOS QUACKENBUSH is a very pleasant 76 y.o.-year old female with an underlying medical history of allergic rhinitis, asthma, bronchiectasis, fibromyalgia, reflux disease, hypertension, hyperlipidemia, irritable bowel syndrome, osteoarthritis, Raynaud's disease, and cognitive impairment, whose history and physical exam are concerning for sleep disordered breathing, supporting a current working diagnosis of unspecified sleep apnea, with the main differential diagnoses of obstructive sleep apnea (OSA) versus upper airway resistance syndrome (UARS) versus central sleep apnea (CSA), or mixed sleep apnea. A laboratory attended sleep study is considered gold standard for  evaluation of sleep disordered breathing and is recommended at this time and clinically justified.   I had a long chat with the patient about my findings and the diagnosis of sleep apnea, particularly OSA, its prognosis and treatment options. We talked about medical/conservative treatments, surgical interventions and non-pharmacological approaches for symptom control. I explained, in particular, the risks and ramifications of untreated moderate to severe OSA, especially with respect to developing cardiovascular disease down the road, including congestive heart failure (CHF), difficult to treat hypertension, cardiac arrhythmias (particularly A-fib), neurovascular complications including TIA, stroke and dementia. Even type 2 diabetes has, in part, been linked to untreated OSA. Symptoms of untreated OSA may include (but may not be limited to) daytime sleepiness, nocturia (i.e. frequent nighttime urination), memory problems, mood irritability and suboptimally controlled or worsening mood disorder such as depression and/or anxiety, lack of energy, lack of motivation, physical discomfort, as well as recurrent headaches, especially morning or nocturnal headaches. We talked about the importance of maintaining a healthy lifestyle and striving for healthy weight.  In addition, we talked about the importance of striving for and maintaining good sleep hygiene. I recommended the following at this time: sleep study.  I outlined the differences between a laboratory attended sleep study which is considered more comprehensive and accurate over the option of a home sleep test (HST); the latter may lead to underestimation of sleep disordered breathing in some instances and does not help with diagnosing upper airway resistance syndrome and is not accurate enough to  diagnose primary central sleep apnea typically. I explained the different sleep test procedures to the patient in detail and also outlined possible surgical and  non-surgical treatment options of OSA, including the use of a pressure airway pressure (PAP) device (ie CPAP, AutoPAP/APAP or BiPAP in certain circumstances), a custom-made dental device (aka oral appliance, which would require a referral to a specialist dentist or orthodontist typically, and is generally speaking not considered a good choice for patients with full dentures or edentulous state), upper airway surgical options, such as traditional UPPP (which is not considered a first-line treatment) or the Inspire device (hypoglossal nerve stimulator, which would involve a referral for consultation with an ENT surgeon, after careful selection, following inclusion criteria). I explained the PAP treatment option to the patient in detail, as this is generally considered first-line treatment.  The patient indicated that she would be willing to try PAP therapy, if the need arises. I explained the importance of being compliant with PAP treatment, not only for insurance purposes but primarily to improve patient's symptoms symptoms, and for the patient's long term health benefit, including to reduce Her cardiovascular risks longer-term.    We will pick up our discussion about the next steps and treatment options after testing.  We will keep her posted as to the test results by phone call and/or MyChart messaging where possible.  We will plan to follow-up in sleep clinic accordingly as well.  I answered all her questions today and the patient was in agreement.   I encouraged her to call with any interim questions, concerns, problems or updates or email [place] through [place].  Generally speaking, sleep test authorizations may take up to 2 weeks, sometimes less, sometimes longer, the patient is encouraged to get in touch with [place] if they do not hear back from the sleep lab staff directly within the next 2 weeks.  Thank you very much for allowing me to participate in the care of this nice patient. If I can be of any further  assistance to you please do not hesitate to talk to me.  Sincerely,   Star Age, MD, PhD

## 2022-06-14 ENCOUNTER — Institutional Professional Consult (permissible substitution): Admitting: Neurology

## 2022-06-28 ENCOUNTER — Telehealth: Payer: Self-pay | Admitting: Neurology

## 2022-06-28 NOTE — Telephone Encounter (Signed)
HST- [employer] medicare no auth req- pt chose.  Patient is scheduled at [facility] for 1 pm.  Mailed packet to the patient.

## 2022-07-13 NOTE — Telephone Encounter (Signed)
Pt seen, first available appt, 05/07/2022 with Dr Krista Blue at [facility]

## 2022-07-19 ENCOUNTER — Encounter: Payer: Self-pay | Admitting: Internal Medicine

## 2022-07-19 ENCOUNTER — Ambulatory Visit (INDEPENDENT_AMBULATORY_CARE_PROVIDER_SITE_OTHER): Payer: Medicare Other | Admitting: Internal Medicine

## 2022-07-19 VITALS — BP 138/60 | HR 66 | Temp 97.4°F | Ht 62.0 in | Wt 132.0 lb

## 2022-07-19 DIAGNOSIS — J0100 Acute maxillary sinusitis, unspecified: Secondary | ICD-10-CM | POA: Insufficient documentation

## 2022-07-19 DIAGNOSIS — J0140 Acute pansinusitis, unspecified: Secondary | ICD-10-CM | POA: Diagnosis not present

## 2022-07-19 MED ORDER — AMOXICILLIN-POT CLAVULANATE 875-125 MG PO TABS: 1.0000 | ORAL_TABLET | Freq: Two times a day (BID) | ORAL | 0 refills | Status: DC

## 2022-07-19 NOTE — Progress Notes (Signed)
Subjective:    Patient ID: Samantha [name], female    DOB: [birth year], 76 y.o.   MRN: [id number]  HPI Here due to respiratory symptoms  Started about 9 days ago Headache and bad congestion Laryngitis Some ear pain---better now Slight chest pain---inhaler helped some  No fever Some cough--mostly dry now Does have post nasal drainage Frontal headache and some maxillary Some sense of SOB--mostly with activity  Taking tylenol cold/flu and mucinex  Current Outpatient Medications on File Prior to Visit  Medication Sig Dispense Refill   albuterol (VENTOLIN HFA) 108 (90 Base) MCG/ACT inhaler Inhale 2 puffs into the lungs every 6 (six) hours as needed for wheezing or shortness of breath.     Beclomethasone Dipropionate (QNASL) 80 MCG/ACT AERS Place 2 sprays into both nostrils daily. 3 each 1   bifidobacterium infantis (ALIGN) capsule Take 1 capsule by mouth daily.     budesonide-formoterol (SYMBICORT) 160-4.5 MCG/ACT inhaler Inhale 2 puffs into the lungs 2 (two) times daily. Rinse mouth 3 each 4   cetirizine (ZYRTEC) 10 MG tablet Take 10 mg by mouth at bedtime.      Cholecalciferol (VITAMIN D) 50 MCG (2000 UT) CAPS Take 1 capsule by mouth daily.     Coenzyme Q10 (CO Q 10) 60 MG CAPS 1 capsule with a meal     cromolyn (NASALCROM) 5.2 MG/ACT nasal spray Place 1 spray into both nostrils 2 (two) times daily as needed for allergies.     hydrochlorothiazide (HYDRODIURIL) 12.5 MG tablet TAKE 1/2 TO 1 TABLET(6.25 TO 12.5 MG) BY MOUTH DAILY 90 tablet 2   levalbuterol (XOPENEX HFA) 45 MCG/ACT inhaler Inhale 1-2 puffs into the lungs every 6 (six) hours as needed for wheezing. 1 each 12   levalbuterol (XOPENEX) 1.25 MG/3ML nebulizer solution Take 1.25 mg by nebulization every 6 (six) hours as needed for wheezing. 72 mL 12   losartan (COZAAR) 50 MG tablet Take 1 tablet (50 mg total) by mouth daily. 90 tablet 3   Magnesium Malate POWD ('1250mg'$  tabs) 1 tablet by mouth qhs     Manganese 50 MG TABS Take 50  mg by mouth at bedtime.      montelukast (SINGULAIR) 10 MG tablet Take 1 tablet (10 mg total) by mouth at bedtime. 90 tablet 3   Olopatadine HCl (PATADAY) 0.2 % SOLN Place 1 drop into both eyes daily as needed (itchy eyes).     pantoprazole (PROTONIX) 40 MG tablet Take 1 tablet (40 mg total) by mouth 2 (two) times daily. 180 tablet 3   Polyethyl Glycol-Propyl Glycol (SYSTANE) 0.4-0.3 % GEL ophthalmic gel Place 1 application into both eyes every 6 (six) hours as needed (dry eyes).     TART CHERRY PO Take by mouth. Take 1 tablet one time a day by mouth     thiamine (VITAMIN B-1) 50 MG tablet Take 100 mg by mouth daily.     TURMERIC PO Take 1 tablet by mouth daily.     No current facility-administered medications on file prior to visit.    Allergies  Allergen Reactions   2,4-D Dimethylamine    Albuterol     Tachycardia   Cefuroxime Axetil     REACTION: pt not sure of reaction- she can tolerate amoxil   Doxycycline Itching   Iodinated Contrast Media Other (See Comments) and Itching   Latex Itching   Lisinopril     Cough   Lovastatin Other (See Comments)    Muscle aches    Sulfonamide Derivatives  REACTION: pt not sure of reaction    Past Medical History:  Diagnosis Date   ALLERGIC RHINITIS 07/26/2008   ASTHMA 04/14/2007   ASYMPTOMATIC POSTMENOPAUSAL STATUS 05/10/2008   Bronchiectasis ([place])    CHEST PAIN 08/26/2008   COLONIC POLYPS, HX OF 04/14/2007   colonic leiomyoma   Decreased grip strength    Episcleritis    FIBROIDS, UTERUS 05/10/2008   Fibromyalgia    Gait abnormality    GERD 04/14/2007   HYPERCHOLESTEROLEMIA 01/07/2008   HYPERGLYCEMIA 11/28/2009   HYPERTENSION 01/28/2008   INSOMNIA 05/10/2008   Irritable bowel syndrome 04/06/2010   OSTEOARTHRITIS 11/28/2009   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    Past Surgical History:  Procedure Laterality Date   COLONOSCOPY W/ POLYPECTOMY     ELECTROCARDIOGRAM  12/04/2006   IR RADIOLOGY PERIPHERAL GUIDED IV START   04/21/2018   IR US GUIDE VASC ACCESS LEFT  04/21/2018   NASAL SEPTUM SURGERY     Stress Cardiolite  02/13/2002    Family History  Problem Relation Age of Onset   Diabetes Mother    Hypertension Mother    Glaucoma Mother    Polymyositis Mother    Heart disease Father        CHF   Allergic rhinitis Father    Asthma Father    Other Father        sideoblastic anemia   Allergic rhinitis Sister    Asthma Sister    Allergic rhinitis Brother    Asthma Brother    Prostate cancer Brother    Sleep apnea Brother    Allergic rhinitis Maternal Aunt    Asthma Maternal Aunt    Allergic rhinitis Paternal Aunt    Asthma Paternal Aunt    Breast cancer Cousin    Colon cancer Cousin    Cancer Neg Hx        No FH of Colon Cancer   Angioedema Neg Hx    Atopy Neg Hx    Immunodeficiency Neg Hx    Urticaria Neg Hx    Eczema Neg Hx     Social History   Socioeconomic History   Marital status: Widowed    Spouse name: Not on file   Number of children: 3   Years of education: some college   Highest education level: Not on file  Occupational History    Employer: RETIRED    Comment: Retired Stage manager)  Tobacco Use   Smoking status: Never    Passive exposure: Never   Smokeless tobacco: Never  Vaping Use   Vaping Use: Never used  Substance and Sexual Activity   Alcohol use: No   Drug use: No   Sexual activity: Never  Other Topics Concern   Not on file  Social History Narrative   Lives alone (widowed 1998).   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      2 sons and 1 daughter.        Right-handed.   No daily caffeine use.   Social Determinants of Health   Financial Resource Strain: Low Risk  (07/12/2020)   Overall Financial Resource Strain (CARDIA)    Difficulty of Paying Living Expenses: Not hard at all  Food Insecurity: No Food Insecurity (07/12/2020)   Hunger Vital Sign    Worried About Running Out of Food in the Last Year: Never true    Ran Out of Food in the Last  Year: Never true  Transportation Needs: No Transportation Needs (07/12/2020)   PRAPARE - Transportation  Lack of Transportation (Medical): No    Lack of Transportation (Non-Medical): No  Physical Activity: Insufficiently Active (07/12/2020)   Exercise Vital Sign    Days of Exercise per Week: 2 days    Minutes of Exercise per Session: 60 min  Stress: No Stress Concern Present (07/12/2020)   [place]    Feeling of Stress : Not at all  Social Connections: Not on file  Intimate Partner Violence: Not At Risk (07/12/2020)   Humiliation, Afraid, Rape, and Kick questionnaire    Fear of Current or Ex-Partner: No    Emotionally Abused: No    Physically Abused: No    Sexually Abused: No   Review of Systems No N/V Some constipation---benefiber not much help Eating okay     Objective:   Physical Exam Constitutional:      Appearance: Normal appearance.  HENT:     Head:     Comments: Frontal > maxillary tenderness    Right Ear: Tympanic membrane and ear canal normal.     Left Ear: Tympanic membrane and ear canal normal.     Nose: No congestion.     Mouth/Throat:     Pharynx: No oropharyngeal exudate or posterior oropharyngeal erythema.  Pulmonary:     Effort: Pulmonary effort is normal.     Breath sounds: Normal breath sounds. No wheezing or rales.  Musculoskeletal:     Cervical back: Neck supple.  Lymphadenopathy:     Cervical: No cervical adenopathy.  Neurological:     Mental Status: She is alert.            Assessment & Plan:

## 2022-07-19 NOTE — Assessment & Plan Note (Signed)
9 days of persistent symptoms Discussed supportive care Will treat with augmentin 875 bid x 7 days (she has done well with this in the past)

## 2022-07-23 NOTE — Telephone Encounter (Signed)
Patient called and canceled her home sleep study appointment because she states she is sick and will call back to r/s. She wants to get completely better before having her home sleep study.

## 2022-07-24 ENCOUNTER — Encounter

## 2022-07-25 DIAGNOSIS — M940 Chondrocostal junction syndrome [Tietze]: Secondary | ICD-10-CM | POA: Diagnosis not present

## 2022-07-25 DIAGNOSIS — R0602 Shortness of breath: Secondary | ICD-10-CM | POA: Diagnosis not present

## 2022-07-25 DIAGNOSIS — I10 Essential (primary) hypertension: Secondary | ICD-10-CM | POA: Diagnosis not present

## 2022-07-25 NOTE — Progress Notes (Deleted)
Patient ID: Samantha [name], female    DOB: 07/14/1946, 76 y.o.   MRN: [id number]  HPI female never smoker followed for allergic rhinitis, asthma, bronchiectasis,  complicated by GERD/ LPR, Raynaud's, HBP, IBS, Glaucoma , Aortic Atherosclerosis,  Barium swallow 01/09/2016-normal PFT 06/05/2019- Mild restriction, normal flows and Diffusion Lab 04/10/21- EOS wnl, IgE 15 wnl --------------------------------------------------------------------------------------    01/22/22- 76 year old female never smoker followed for Allergic Rhinitis, Asthma, Bronchiectasis, complicated by GERD/ LPR, Raynaud's, HBP, IBS, Glaucoma -Albuterol hfa, azelastine nasal, Qnasl, Symbicort 160, singulair, Zyrtec, Protonix, -Carafate, Covid vax-4 Phizer Flu vax-had Lab 04/10/21- EOS wnl, IgE 15 wnl She says breathing has done very well through the spring pollen season.  She wears her COVID mask anytime she goes out in public and has had no recent respiratory infection. Incidental leg and hip pain may be interfering with sleep some.  Inhalers are okay with no concerns.  Not using rescue inhaler frequently now. CT max fac 08/02/21- IMPRESSION: Clear sinuses.  No nasal septal deviation.  07/27/22- 76 year old female never smoker followed for Allergic Rhinitis, Asthma, Bronchiectasis, complicated by GERD/ LPR, Raynaud's, HBP, IBS, Glaucoma -Albuterol hfa, azelastine nasal, Qnasl, Symbicort 160, singulair, Zyrtec, Protonix, -Carafate, Covid vax-4 Phizer Flu vax-had Recent pansinusitis Rx'd by PCP Bilateral pneumonia Ur[facility] 05/25/22-> levaquin 750 x 7 days,   CXR 07/25/22- bilateral lower lobe infiltrates.   Review of Systems-see HPI   + = positive Constitutional:   No weight loss, night sweats,  Fevers, chills, fatigue, lassitude. HEENT:   No headaches,  Difficulty swallowing,  Tooth/dental problems,  Sore throat,                No sneezing, itching, or ear ache,   nasal congestion, post nasal drip, CV:  + chest  pain, orthopnea, PND, swelling in lower extremities, anasarca, dizziness, palpitations GI  No heartburn, indigestion, abdominal pain, nausea, vomiting,  Resp: No shortness of breath with exertion or at rest.  No excess mucus, +productive cough,  +-non-productive cough,  No coughing up of blood.  No change in color of mucus.  +infrequent wheezing.   Skin: Clear GU:  MS:  + joint pain or swelling.   Psych:  No change in mood or affect. No depression or anxiety.  No memory loss.   Objective:   Physical Exam General- Alert, Oriented, Affect-appropriate, Distress- none acute; pleasant  Skin- clear Lymphadenopathy- none Head- atraumatic            Eyes- Gross vision intact, PERRLA, conjunctivae clear secretions,            Ears- Normal hearing            Nose- clear, no-Septal dev, mucus, polyps, erosion, perforation .                        Throat- Malampatti III.   TMs not  retracted , mucosa not red, drainage- none, tonsils- atrophic;   Neck- flexible , trachea midline, no stridor , thyroid nl, carotid no bruit Chest - symmetrical excursion , unlabored           Heart/CV- RRR , no murmur , no gallop  , no rub, nl s1 s2                           - JVD- none , edema-none, stasis changes- none, varices- none           Lung-  wheeze or rhonchi-none, cough-none , dullness-none, rub- none, crackles- none           Chest wall-  Abd-  Br/ Gen/ Rectal- Not done, not indicated Extrem- cyanosis- none, clubbing, none, atrophy- none, strength- nl.  Neuro- grossly intact to observation

## 2022-07-26 ENCOUNTER — Telehealth: Payer: Self-pay | Admitting: Internal Medicine

## 2022-07-26 DIAGNOSIS — J189 Pneumonia, unspecified organism: Secondary | ICD-10-CM

## 2022-07-26 NOTE — Telephone Encounter (Signed)
We could see her with CXR in one month, allowing time to finish antibiotic and clear chest.

## 2022-07-26 NOTE — Telephone Encounter (Signed)
Called patient back this morning and she states that she went to [facility] in [place] yesterday and was diagnosed with PNA in both lungs. She states she was placed on Levaquin '750mg'$  for 7 days. She started antibiotic yesterday. She states she is doing her inhalers and nebulizer machine as ordered. But she had a appointment with Dr Annamaria Boots for tomorrow but she canceled.   When would you want her to follow up Dr Annamaria Boots? Im thinking after she finished her antibiotic.   Would you like a chest xray before visit?  Please advise sir

## 2022-07-26 NOTE — Telephone Encounter (Signed)
Patient called to inform the doctor that she went to urgent care yesterday and was diagnosed with pneumonia.  She wants to speak to the nurse to give her the details and ask the doctor is there anything else she needs to do.  Please call patient to disuss at [phone] or [phone]

## 2022-07-26 NOTE — Telephone Encounter (Signed)
Called and spoke to patient and advised her that Dr Annamaria Boots wants her to follow up with him in 1 month to give her time to complete the medication. Chest xray at next office visit. Order placed. While on phone go there set up for follow up   08/28/2022 10:30am Dr Annamaria Boots  Nothing further needed

## 2022-07-27 ENCOUNTER — Ambulatory Visit: Payer: Medicare Other | Admitting: Internal Medicine

## 2022-08-14 ENCOUNTER — Ambulatory Visit (INDEPENDENT_AMBULATORY_CARE_PROVIDER_SITE_OTHER)
Admission: RE | Admit: 2022-08-14 | Discharge: 2022-08-14 | Disposition: A | Discharge status: Home / Self Care | Payer: Medicare Other | Source: Ambulatory Visit | Attending: Family Medicine | Admitting: Family Medicine

## 2022-08-14 ENCOUNTER — Ambulatory Visit (INDEPENDENT_AMBULATORY_CARE_PROVIDER_SITE_OTHER): Payer: Medicare Other | Admitting: Family Medicine

## 2022-08-14 ENCOUNTER — Encounter: Payer: Self-pay | Admitting: Family Medicine

## 2022-08-14 VITALS — BP 136/60 | HR 54 | Temp 97.7°F | Ht 62.0 in | Wt 132.0 lb

## 2022-08-14 DIAGNOSIS — J189 Pneumonia, unspecified organism: Secondary | ICD-10-CM

## 2022-08-14 MED ORDER — FLUCONAZOLE 100 MG PO TABS: 100.0000 mg | ORAL_TABLET | ORAL | 0 refills | Status: DC

## 2022-08-14 MED ORDER — NYSTATIN 100000 UNIT/ML MT SUSP: 5.0000 mL | Freq: Four times a day (QID) | OROMUCOSAL | 0 refills | Status: DC

## 2022-08-14 NOTE — Patient Instructions (Addendum)
Go to the lab on the way out.   If you have mychart we'll likely use that to update you.    Take care.  Glad to see you. 

## 2022-08-14 NOTE — Progress Notes (Unsigned)
Previous chest x-ray report discussed with patient.   07/25/2022 4:32 PM EST  Chest: There is infiltrate in both lower lobes The costophrenic angles are sharp, without evidence for effusion. Heart size and mediastinal contours are within normal limits. There is no osseous abnormality identified.  She felt worse after prev OV here, went to UC, CXR d/w pt.  Tx'd for PNA with levaquin, improved in the meantime but not fully back to baseline.  Done with abx now.  D/w pt about diflucan and magic mouthwash for thrush and yeast infection after antibiotic use.  Dec in sputum in the meantime.  No fevers.    Handicap parking form done.  Given to patient.   Meds, vitals, and allergies reviewed.   ROS: Per HPI unless specifically indicated in ROS section   GEN: nad, alert and oriented HEENT: ncat NECK: supple w/o LA CV: rrr.  PULM: ctab, no inc wob, no focal decrease in breath sounds. ABD: soft, +bs EXT: no edema SKIN: Well-perfused.

## 2022-08-15 ENCOUNTER — Telehealth: Payer: Self-pay | Admitting: Family Medicine

## 2022-08-15 DIAGNOSIS — J189 Pneumonia, unspecified organism: Secondary | ICD-10-CM | POA: Insufficient documentation

## 2022-08-15 NOTE — Assessment & Plan Note (Signed)
Routed to Dr. Annamaria Boots as Juluis Rainier.  Lungs are clear.  Improved but not yet back to baseline.  Recheck chest x-ray today.  See notes on imaging.  She has pulmonary follow-up pending.  Update me as needed.

## 2022-08-15 NOTE — Telephone Encounter (Signed)
Patient call to discuss lab results and requested a call back. Call back [phone].

## 2022-08-16 NOTE — Telephone Encounter (Signed)
Patient notified of chest x-ray results

## 2022-08-26 NOTE — Progress Notes (Signed)
Patient ID: Samantha [name], female    DOB: [birth year], 76 y.o.   MRN: [id number]  HPI female never smoker followed for allergic rhinitis, asthma, bronchiectasis,  complicated by GERD/ LPR, Raynaud's, HBP, IBS, Glaucoma , Aortic Atherosclerosis,  Barium swallow 01/09/2016-normal PFT 06/05/2019- Mild restriction, normal flows and Diffusion --------------------------------------------------------------------------------------   01/22/22- 76 year old female never smoker followed for Allergic Rhinitis, Asthma, Bronchiectasis, complicated by GERD/ LPR, Raynaud's, HBP, IBS, Glaucoma -Albuterol hfa, azelastine nasal, Qnasl, Symbicort 160, singulair, Zyrtec, Protonix, -Carafate, Covid vax-4 Phizer Flu vax-had Lab 04/10/21- EOS wnl, IgE 15 wnl She says breathing has done very well through the spring pollen season.  She wears her COVID mask anytime she goes out in public and has had no recent respiratory infection. Incidental leg and hip pain may be interfering with sleep some.  Inhalers are okay with no concerns.  Not using rescue inhaler frequently now. CT max fac 08/02/21- IMPRESSION: Clear sinuses.  No nasal septal deviation.  08/28/22-  76 year old female never smoker followed for Allergic Rhinitis, Asthma, Bronchiectasis, complicated by GERD/ LPR, Raynaud's, HBP, IBS, Glaucoma -Albuterol hfa, azelastine nasal, Qnasl, Symbicort 160, singulair, Zyrtec, Protonix, -Carafate, Covid vax-4 Phizer Flu vax- Lab 04/10/21- EOS wnl, IgE 15 wnl Treated elsewhere for pneumonia, dx not supported by CXR. May have been bronchitis. Feeling much better now. Still using recue inahler 1-2x/ day, continuing Symbicort. Rhinitis controlled with Nasalcrom and azelastine. CXR 08/14/22- IMPRESSION: No focal pneumonia. Mild increased pulmonary interstitium bilaterally unchanged compared prior exam, chronic.   Review of Systems-see HPI   + = positive Constitutional:   No weight loss, night sweats,  Fevers, chills,  fatigue, lassitude. HEENT:   No headaches,  Difficulty swallowing,  Tooth/dental problems,  Sore throat,                No sneezing, itching, or ear ache,   nasal congestion, post nasal drip, CV:  + chest pain, orthopnea, PND, swelling in lower extremities, anasarca, dizziness, palpitations GI  No heartburn, indigestion, abdominal pain, nausea, vomiting,  Resp: No shortness of breath with exertion or at rest.  No excess mucus, +productive cough,  +-non-productive cough,  No coughing up of blood.  No change in color of mucus.  +infrequent wheezing.   Skin: Clear GU:  MS:  + joint pain or swelling.   Psych:  No change in mood or affect. No depression or anxiety.  No memory loss.   Objective:   Physical Exam General- Alert, Oriented, Affect-appropriate, Distress- none acute; pleasant  Skin- clear Lymphadenopathy- none Head- atraumatic            Eyes- Gross vision intact, PERRLA, conjunctivae clear secretions,            Ears- Normal hearing            Nose- clear, no-Septal dev, mucus, polyps, erosion, perforation .                        Throat- Malampatti III.   TMs not  retracted , mucosa not red, drainage- none, tonsils- atrophic;   Neck- flexible , trachea midline, no stridor , thyroid nl, carotid no bruit Chest - symmetrical excursion , unlabored           Heart/CV- RRR , no murmur , no gallop  , no rub, nl s1 s2                           -  JVD- none , edema-none, stasis changes- none, varices- none           Lung-  wheeze or rhonchi-none, cough-none , dullness-none, rub- none, crackles- none           Chest wall-  Abd-  Br/ Gen/ Rectal- Not done, not indicated Extrem- cyanosis- none, clubbing, none, atrophy- none, strength- nl.  Neuro- grossly intact to observation

## 2022-08-28 ENCOUNTER — Ambulatory Visit (INDEPENDENT_AMBULATORY_CARE_PROVIDER_SITE_OTHER): Payer: Medicare Other | Admitting: Internal Medicine

## 2022-08-28 ENCOUNTER — Encounter: Payer: Self-pay | Admitting: Internal Medicine

## 2022-08-28 VITALS — BP 122/64 | HR 69 | Temp 98.8°F | Ht 62.0 in | Wt 132.0 lb

## 2022-08-28 DIAGNOSIS — J302 Other seasonal allergic rhinitis: Secondary | ICD-10-CM

## 2022-08-28 DIAGNOSIS — J4541 Moderate persistent asthma with (acute) exacerbation: Secondary | ICD-10-CM | POA: Diagnosis not present

## 2022-08-28 DIAGNOSIS — J3089 Other allergic rhinitis: Secondary | ICD-10-CM

## 2022-08-28 MED ORDER — AZELASTINE HCL 0.15 % NA SOLN
NASAL | 3 refills | Status: DC
Start: 2022-08-28 — End: 2023-08-26

## 2022-08-28 MED ORDER — MONTELUKAST SODIUM 10 MG PO TABS: 10.0000 mg | ORAL_TABLET | Freq: Every day | ORAL | 3 refills | Status: DC

## 2022-08-28 MED ORDER — ALBUTEROL SULFATE HFA 108 (90 BASE) MCG/ACT IN AERS: 2.0000 | INHALATION_SPRAY | Freq: Four times a day (QID) | RESPIRATORY_TRACT | 3 refills | Status: DC | PRN

## 2022-08-28 MED ORDER — CROMOLYN SODIUM 5.2 MG/ACT NA AERS
1.0000 | INHALATION_SPRAY | Freq: Two times a day (BID) | NASAL | 3 refills | Status: DC | PRN
Start: 2022-08-28 — End: 2022-10-01

## 2022-08-28 MED ORDER — BUDESONIDE-FORMOTEROL FUMARATE 160-4.5 MCG/ACT IN AERO: 2.0000 | INHALATION_SPRAY | Freq: Two times a day (BID) | RESPIRATORY_TRACT | 4 refills | Status: DC

## 2022-08-28 NOTE — Patient Instructions (Signed)
Meds refilled  Glad you are feeling better

## 2022-09-06 ENCOUNTER — Other Ambulatory Visit: Payer: Self-pay | Admitting: Internal Medicine

## 2022-09-23 ENCOUNTER — Encounter: Payer: Self-pay | Admitting: Internal Medicine

## 2022-09-23 NOTE — Assessment & Plan Note (Signed)
Continues her nasal sprays. Discussed potential for ENT evaluation.

## 2022-09-23 NOTE — Assessment & Plan Note (Signed)
Recent exacerbation may have been bronchitis. Still using her rescue inhaler most days. Plan- med refills

## 2022-10-01 ENCOUNTER — Telehealth: Payer: Self-pay | Admitting: Internal Medicine

## 2022-10-01 ENCOUNTER — Encounter: Payer: Self-pay | Admitting: Internal Medicine

## 2022-10-01 ENCOUNTER — Ambulatory Visit (INDEPENDENT_AMBULATORY_CARE_PROVIDER_SITE_OTHER): Payer: Medicare Other

## 2022-10-01 ENCOUNTER — Ambulatory Visit (INDEPENDENT_AMBULATORY_CARE_PROVIDER_SITE_OTHER): Payer: Medicare Other | Admitting: Internal Medicine

## 2022-10-01 VITALS — BP 136/66 | HR 66 | Ht 62.0 in | Wt 137.0 lb

## 2022-10-01 DIAGNOSIS — J310 Chronic rhinitis: Secondary | ICD-10-CM | POA: Diagnosis not present

## 2022-10-01 DIAGNOSIS — J4541 Moderate persistent asthma with (acute) exacerbation: Secondary | ICD-10-CM | POA: Diagnosis not present

## 2022-10-01 DIAGNOSIS — J209 Acute bronchitis, unspecified: Secondary | ICD-10-CM | POA: Diagnosis not present

## 2022-10-01 LAB — POC COVID19 BINAXNOW: SARS Coronavirus 2 Ag: NEGATIVE

## 2022-10-01 MED ORDER — AMOXICILLIN-POT CLAVULANATE 875-125 MG PO TABS: 1.0000 | ORAL_TABLET | Freq: Two times a day (BID) | ORAL | 0 refills | Status: DC

## 2022-10-01 MED ORDER — CROMOLYN SODIUM 5.2 MG/ACT NA AERS: 1.0000 | INHALATION_SPRAY | Freq: Two times a day (BID) | NASAL | 3 refills | Status: DC | PRN

## 2022-10-01 MED ORDER — MONTELUKAST SODIUM 10 MG PO TABS: 10.0000 mg | ORAL_TABLET | Freq: Every day | ORAL | 3 refills | Status: DC

## 2022-10-01 MED ORDER — METHYLPREDNISOLONE ACETATE 80 MG/ML IJ SUSP
80.0000 mg | Freq: Once | INTRAMUSCULAR | Status: AC
  Administered 2022-10-01: 80 mg via INTRAMUSCULAR

## 2022-10-01 NOTE — Progress Notes (Signed)
Patient ID: Samantha [name], female    DOB: 02/28/1946, 77 y.o.   MRN: [id number]  HPI female never smoker followed for allergic rhinitis, asthma, bronchiectasis,  complicated by GERD/ LPR, Raynaud's, HBP, IBS, Glaucoma , Aortic Atherosclerosis,  Barium swallow 01/09/2016-normal PFT 06/05/2019- Mild restriction, normal flows and Diffusion --------------------------------------------------------------------------------------  .  08/28/22-  77 year old female never smoker followed for Allergic Rhinitis, Asthma, Bronchiectasis, complicated by GERD/ LPR, Raynaud's, HBP, IBS, Glaucoma -Albuterol hfa, azelastine nasal, Qnasl, Symbicort 160, singulair, Zyrtec, Protonix, -Carafate, Covid vax-4 Phizer Flu vax- Lab 04/10/21- EOS wnl, IgE 15 wnl Treated elsewhere for pneumonia, dx not supported by CXR. May have been bronchitis. Feeling much better now. Still using recue inhaler 1-2x/ day, continuing Symbicort. Rhinitis controlled with Nasalcrom and azelastine. CXR 08/14/22- IMPRESSION: No focal pneumonia. Mild increased pulmonary interstitium bilaterally unchanged compared prior exam, chronic.  10/01/22-  77 year old female never smoker followed for Allergic Rhinitis, Asthma, Bronchiectasis, complicated by GERD/ LPR, Raynaud's, HBP, IBS, Glaucoma -Albuterol hfa, azelastine nasal, Qnasl, Symbicort 160, singulair, Zyrtec, Protonix, -Carafate, Covid vax-4 Phizer Flu vax- Lab 04/10/21- EOS wnl, IgE 15 wnl Covid test 10/01/22-  -----Symptoms started on 09/22/22. Productive cough with white/brown phlegm, bilateral rib pain due to excess coughing, sob, wheezing Had levaquin in November Onset January 13 with head congestion spreading his bronchitis into chest.  Some cough.  She has tussive bilateral rib pains, laryngitis.  Thick white/brown mucus.  No blood and apparently no fever.   Review of Systems-see HPI   + = positive Constitutional:   No weight loss, night sweats,  Fevers, chills, fatigue,  lassitude. HEENT:   No headaches,  Difficulty swallowing,  Tooth/dental problems,  Sore throat,                No sneezing, itching, or ear ache,   nasal congestion, post nasal drip, CV:  + chest pain, orthopnea, PND, swelling in lower extremities, anasarca, dizziness, palpitations GI  No heartburn, indigestion, abdominal pain, nausea, vomiting,  Resp: No shortness of breath with exertion or at rest.  No excess mucus, +productive cough,  +-non-productive cough,  No coughing up of blood.  No change in color of mucus.  +infrequent wheezing.   Skin: Clear GU:  MS:  + joint pain or swelling.   Psych:  No change in mood or affect. No depression or anxiety.  No memory loss.   Objective:   Physical Exam General- Alert, Oriented, Affect-appropriate, Distress- none acute; pleasant  Skin- clear Lymphadenopathy- none Head- atraumatic            Eyes- Gross vision intact, PERRLA, conjunctivae clear secretions,            Ears- Normal hearing            Nose- clear, no-Septal dev, mucus, polyps, erosion, perforation .                        Throat- Malampatti III.   TMs not  retracted , mucosa not red, drainage- none, tonsils- atrophic;   Neck- flexible , trachea midline, no stridor , thyroid nl, carotid no bruit Chest - symmetrical excursion , unlabored           Heart/CV- RRR , no murmur , no gallop  , no rub, nl s1 s2                           - JVD- none , edema-none,  stasis changes- none, varices- none           Lung-  +crackles, cough+dry , dullness-none, rub- none, crackles- none           Chest wall-  Abd-  Br/ Gen/ Rectal- Not done, not indicated Extrem- cyanosis- none, clubbing, none, atrophy- none, strength- nl.  Neuro- grossly intact to observation

## 2022-10-01 NOTE — Telephone Encounter (Signed)
Spoke with pt who states that increased cough, head and chest congestion started on 09/12/22. Pt states rib area where sore from coughing and cough is productive with thick yellow mucus. Pt denies fever/ GI upset. Pt scheduled for OV with Dr. Annamaria Boots at 3:30pm today. Nothing further needed at this time.   Routing to Dr. Annamaria Boots as Juluis Rainier.

## 2022-10-01 NOTE — Patient Instructions (Addendum)
Script sent to [facility] for augmentin antibiotic  Script sent to [facility] for [employer]- CXR   Acute Bronchitis  Order- Depo 80  dx Acute bronchitis  Ok to keep appt in March

## 2022-10-01 NOTE — Telephone Encounter (Signed)
PT calling for appt w/Dr. Annamaria Boots. No appt avail. Nay sayed NP's appt.   Congestion, cough and pressure in sides. Pls call to advise. She is willing to go to PCP. Started on the 13th. Told her to call PCP but she wanted to hear from [place] first.   Pls call  PT [phone]

## 2022-10-04 ENCOUNTER — Telehealth: Payer: Self-pay | Admitting: Internal Medicine

## 2022-10-04 NOTE — Telephone Encounter (Signed)
The "summation shadow" is just a breast shadow on her xray.  If she isn't feeling that she has a significant infection now, she can hold off on taking the antibiotic.

## 2022-10-04 NOTE — Telephone Encounter (Signed)
Called and spoke to patient and went over results from Dr Annamaria Boots with her. She verbalized understanding. Nothing further needed

## 2022-10-04 NOTE — Telephone Encounter (Signed)
Called patient back and went over chest xray and she is wondering if she needs to start the antibiotic that you gave her?  She is also wondering about the vague density over the right mid chest is considered to be a summation shadows?  Please advise sir

## 2022-10-04 NOTE — Telephone Encounter (Signed)
PT calling for Xray results. Pls call @ [employer] [phone] or Cell [phone] (Best #)

## 2022-10-26 NOTE — Progress Notes (Unsigned)
Cardiology Office Note:    Date:  10/29/2022   ID:  Samantha [name], DOB [birth year], MRN [id number]  PCP:  Tonia Ghent, MD   [facility] Providers Cardiologist:  None {  Referring MD: Tonia Ghent, MD     History of Present Illness:    Samantha [name] is a 77 y.o. female with a hx of HTN, HLD asthma, GERD and fibromyalgia who was previously followed by Dr. Debara Pickett who presents to clinic for follow-up.  Patient was last seen in clinic in 04/2018 by Dr. Debara Pickett. Had coronary CTA 04/2018 which showed no evidence of CAD. Ca score 0.   Was last seen in clinic on 03/2022 where she was having episodes of chest pain. TTE 04/2022 with LVEF [id number], normal diastolic function, normal strain, normal RV, no significant valve disease.   Today, the patient overall feels well. She continues to have intermittent chest pain occasionally when she walks or when she picks up something heavy. No orthopnea, PND, or LE edema. States that she has been losing weight but she has been following with her PCP regarding this. Otherwise, blood pressure is well controlled. Tolerating medications as prescribed.   Past Medical History:  Diagnosis Date   ALLERGIC RHINITIS 07/26/2008   ASTHMA 04/14/2007   ASYMPTOMATIC POSTMENOPAUSAL STATUS 05/10/2008   Bronchiectasis ([place])    CHEST PAIN 08/26/2008   COLONIC POLYPS, HX OF 04/14/2007   colonic leiomyoma   Decreased grip strength    Episcleritis    FIBROIDS, UTERUS 05/10/2008   Fibromyalgia    Gait abnormality    GERD 04/14/2007   HYPERCHOLESTEROLEMIA 01/07/2008   HYPERGLYCEMIA 11/28/2009   HYPERTENSION 01/28/2008   INSOMNIA 05/10/2008   Irritable bowel syndrome 04/06/2010   OSTEOARTHRITIS 11/28/2009   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    Past Surgical History:  Procedure Laterality Date   COLONOSCOPY W/ POLYPECTOMY     ELECTROCARDIOGRAM  12/04/2006   IR RADIOLOGY PERIPHERAL GUIDED IV START  04/21/2018   IR US GUIDE VASC ACCESS LEFT  04/21/2018    NASAL SEPTUM SURGERY     Stress Cardiolite  02/13/2002    Current Medications: Current Meds  Medication Sig   albuterol (VENTOLIN HFA) 108 (90 Base) MCG/ACT inhaler Inhale 2 puffs into the lungs every 6 (six) hours as needed for wheezing or shortness of breath.   amLODipine (NORVASC) 2.5 MG tablet Take 1 tablet (2.5 mg total) by mouth daily.   Azelastine HCl 0.15 % SOLN 2 puffs each nostril twice daily as needed   Beclomethasone Dipropionate (QNASL) 80 MCG/ACT AERS Place 2 sprays into both nostrils daily.   bifidobacterium infantis (ALIGN) capsule Take 1 capsule by mouth daily.   budesonide-formoterol (SYMBICORT) 160-4.5 MCG/ACT inhaler Inhale 2 puffs into the lungs 2 (two) times daily. Rinse mouth   cetirizine (ZYRTEC) 10 MG tablet Take 10 mg by mouth at bedtime.    Cholecalciferol (VITAMIN D) 50 MCG (2000 UT) CAPS Take 1 capsule by mouth daily.   Coenzyme Q10 (CO Q 10) 60 MG CAPS 1 capsule with a meal   cromolyn (NASALCROM) 5.2 MG/ACT nasal spray Place 1 spray into both nostrils 2 (two) times daily as needed for allergies.   hydrochlorothiazide (HYDRODIURIL) 12.5 MG tablet TAKE 1/2 TO 1 TABLET(6.25 TO 12.5 MG) BY MOUTH DAILY   levalbuterol (XOPENEX HFA) 45 MCG/ACT inhaler Inhale 1-2 puffs into the lungs every 6 (six) hours as needed for wheezing.   levalbuterol (XOPENEX) 1.25 MG/3ML nebulizer solution Take 1.25 mg by nebulization every 6 (  six) hours as needed for wheezing.   losartan (COZAAR) 50 MG tablet Take 1 tablet (50 mg total) by mouth daily.   Magnesium Malate POWD (1250m tabs) 1 tablet by mouth qhs   Manganese 50 MG TABS Take 50 mg by mouth at bedtime.    montelukast (SINGULAIR) 10 MG tablet Take 1 tablet (10 mg total) by mouth at bedtime.   Olopatadine HCl (PATADAY) 0.2 % SOLN Place 1 drop into both eyes daily as needed (itchy eyes).   pantoprazole (PROTONIX) 40 MG tablet Take 1 tablet (40 mg total) by mouth 2 (two) times daily.   Polyethyl Glycol-Propyl Glycol (SYSTANE) 0.4-0.3  % GEL ophthalmic gel Place 1 application into both eyes every 6 (six) hours as needed (dry eyes).   TART CHERRY PO Take by mouth. Take 1 tablet one time a day by mouth   thiamine (VITAMIN B-1) 50 MG tablet Take 100 mg by mouth daily.   TURMERIC PO Take 1 tablet by mouth daily.     Allergies:   2,4-d dimethylamine; Albuterol; Cefuroxime axetil; Doxycycline; Iodinated contrast media; Latex; Lisinopril; Lovastatin; and Sulfonamide derivatives   Social History   Socioeconomic History   Marital status: Widowed    Spouse name: Not on file   Number of children: 3   Years of education: some college   Highest education level: Not on file  Occupational History    Employer: RETIRED    Comment: Retired (Stage manager  Tobacco Use   Smoking status: Never    Passive exposure: Never   Smokeless tobacco: Never  Vaping Use   Vaping Use: Never used  Substance and Sexual Activity   Alcohol use: No   Drug use: No   Sexual activity: Never  Other Topics Concern   Not on file  Social History Narrative   Lives alone (widowed 1998).   Retired sMuseum/gallery curator  She is also an aChief Strategy Officer      2 sons and 1 daughter.        Right-handed.   No daily caffeine use.   Social Determinants of Health   Financial Resource Strain: Low Risk  (07/12/2020)   Overall Financial Resource Strain (CARDIA)    Difficulty of Paying Living Expenses: Not hard at all  Food Insecurity: No Food Insecurity (07/12/2020)   Hunger Vital Sign    Worried About Running Out of Food in the Last Year: Never true    Ran Out of Food in the Last Year: Never true  Transportation Needs: No Transportation Needs (07/12/2020)   PRAPARE - THydrologist(Medical): No    Lack of Transportation (Non-Medical): No  Physical Activity: Insufficiently Active (07/12/2020)   Exercise Vital Sign    Days of Exercise per Week: 2 days    Minutes of Exercise per Session: 60 min  Stress: No Stress Concern Present (07/12/2020)    [place]   Feeling of Stress : Not at all  Social Connections: Not on file     Family History: The patient's family history includes Allergic rhinitis in her brother, father, maternal aunt, paternal aunt, and sister; Asthma in her brother, father, maternal aunt, paternal aunt, and sister; Breast cancer in her cousin; Colon cancer in her cousin; Diabetes in her mother; Glaucoma in her mother; Heart disease in her father; Hypertension in her mother; Other in her father; Polymyositis in her mother; Prostate cancer in her brother; Sleep apnea in her brother. There is  no history of Cancer, Angioedema, Atopy, Immunodeficiency, Urticaria, or Eczema.  ROS:   Please see the history of present illness.    All other systems reviewed and are negative.  EKGs/Labs/Other Studies Reviewed:    The following studies were reviewed today: TTE 04-22-2022: IMPRESSIONS     1. Left ventricular ejection fraction, by estimation, is 65 to 70%. The  left ventricle has normal function. The left ventricle has no regional  wall motion abnormalities. Left ventricular diastolic parameters were  normal. The average left ventricular  global longitudinal strain is -18.9 %. The global longitudinal strain is  normal.   2. Right ventricular systolic function is normal. The right ventricular  size is normal.   3. The mitral valve is normal in structure. No evidence of mitral valve  regurgitation.   4. The aortic valve is tricuspid. Aortic valve regurgitation is not  visualized.   5. The inferior vena cava is normal in size with greater than 50%  respiratory variability, suggesting right atrial pressure of 3 mmHg.   Cardiac Monitor 03/2022:   Patch wear time was 3 days   Predominant rhythm was NSR with average HR 62bpm   Rare ectopy (<1% SVE, <1% VE)   Patient triggered events correlate with NSR/sinus bradycardia   No sustained arrhythmias or  significant pauses     CTA Coronaries Apr 22, 2018: FINDINGS: A 120 kV prospective scan was triggered in the descending thoracic aorta at 111 HU's. Axial non-contrast 3 mm slices were carried out through the heart. The data set was analyzed on a dedicated work station and scored using the [place]. Gantry rotation speed was 250 msecs and collimation was .6 mm. No beta blockade and 0.8 mg of sl NTG was given. The 3D data set was reconstructed in 5% intervals of the 67-82 % of the R-R cycle. Diastolic phases were analyzed on a dedicated work station using MPR, MIP and VRT modes. The patient received 80 cc of contrast.   Aorta:  Normal size.  No calcifications.  No dissection.   Aortic Valve:  Trileaflet.  No calcifications.   Coronary Arteries:  Normal coronary origin. Left dominance.   Left main is a large and long artery that gives rise to LAD and LCX arteries. Left main has no plaque.   LAD is a large vessel that gives rise to one diagonal artery, wraps around the apex and has no plaque.   LCX is a large dominant artery that gives rise to two OM branches, PDA and PLA. There is no plaque.   Other findings:   Normal pulmonary vein drainage into the left atrium.   Normal let atrial appendage without a thrombus.   Normal size of the pulmonary artery.   IMPRESSION: 1. Coronary calcium score of 0. This was 0 percentile for age and sex matched control.   2. Normal coronary origin with left dominance.   3. No evidence of CAD.  Consider non-cardiac sources of chest pain.    EKG:  EKG is personally reviewed.  03/20/2022 EKG: No new tracing   Recent Labs: 02/26/2022: ALT 13; BUN 15; Creatinine, Ser 0.80; Hemoglobin 12.2; Platelets 190; Potassium 3.4; Sodium 139 05/07/2022: TSH 3.140  Recent Lipid Panel    Component Value Date/Time   CHOL 223 (H) 10/20/2021 0938   TRIG 90.0 10/20/2021 0938   HDL 104.50 10/20/2021 0938   CHOLHDL 2 10/20/2021 0938   VLDL 18.0 10/20/2021  0938   LDLCALC 100 (H) 10/20/2021 0938     Risk  Assessment/Calculations:           Physical Exam:    VS:  BP 130/60   Pulse 65   Ht 5' 2"$  (1.575 m)   Wt 135 lb 12.8 oz (61.6 kg)   SpO2 98%   BMI 24.84 kg/m     Wt Readings from Last 3 Encounters:  10/29/22 135 lb 12.8 oz (61.6 kg)  10/01/22 137 lb (62.1 kg)  08/28/22 132 lb (59.9 kg)     GEN:  Well nourished, well developed in no acute distress HEENT: Normal NECK: No JVD; No carotid bruits CARDIAC: RRR, 1/6 systolic murmur. No rubs or gallops. RESPIRATORY:  Clear to auscultation without rales, wheezing or rhonchi  ABDOMEN: Soft, non-tender, non-distended MUSCULOSKELETAL:  No edema; No deformity  SKIN: Warm and dry NEUROLOGIC:  Alert and oriented x 3 PSYCHIATRIC:  Normal affect   ASSESSMENT:    1. Chest pain of uncertain etiology   2. Essential hypertension   3. Dizziness   4. Fibromyalgia     PLAN:    In order of problems listed above:  #Chest Pain: Do not suspect cardiac in nature given coronary CTA in 2019 with no evidence of CAD. Ca score 0. Symptoms seem to be both exertional and nonexertional. TTE 04/2022 with LVEF 65-70%, normal RV, no significant valve disease. Given she has raynaud's, can trial low dose amlodipine to see if helps raynauds symptoms and if there is some element of vasospasm. -Start amlodipine 2.56m daily  #Dizziness: TTE with normal BiV function, no significant valve disease. Cardiac monitor with NSR, no arrhythmias, rare ectopy.   #HTN: Running 140s at home. Given symptoms of dizziness, will not increase at this time and continue to monitor.  -Continue HCTZ 12.583mdaily -Continue losartan 5078maily  #Fibromyalgia: -Management per PCP and Rheumatologist         Follow up 6 months  Medication Adjustments/Labs and Tests Ordered: Current medicines are reviewed at length with the patient today.  Concerns regarding medicines are outlined above.  No orders of the defined types  were placed in this encounter.  Meds ordered this encounter  Medications   amLODipine (NORVASC) 2.5 MG tablet    Sig: Take 1 tablet (2.5 mg total) by mouth daily.    Dispense:  90 tablet    Refill:  2    Patient Instructions  Medication Instructions:   START TAKING AMLODIPINE 2.5 MG BY MOUTH DAILY  *If you need a refill on your cardiac medications before your next appointment, please call your pharmacy*    Follow-Up: [facility] and your [facility] needs are our priority.  As part of our continuing mission to provide you with exceptional heart care, we have created designated Provider Care Teams.  These Care Teams include your primary Cardiologist (physician) and Advanced Practice Providers (APPs -  Physician Assistants and Nurse Practitioners) who all work together to provide you with the care you need, when you need it.  We recommend signing up for the patient portal called "MyChart".  Sign up information is provided on this After Visit Summary.  MyChart is used to connect with patients for Virtual Visits (Telemedicine).  Patients are able to view lab/test results, encounter notes, upcoming appointments, etc.  Non-urgent messages can be sent to your provider as well.   To learn more about what you can do with MyChart, go to [email or web address]  Your next appointment:   6 month(s)  Provider:   DR. PEMJohney Frame

## 2022-10-29 ENCOUNTER — Encounter: Payer: Self-pay | Admitting: Cardiology

## 2022-10-29 ENCOUNTER — Ambulatory Visit: Payer: Medicare Other | Attending: Cardiology | Admitting: Cardiology

## 2022-10-29 VITALS — BP 130/60 | HR 65 | Ht 62.0 in | Wt 135.8 lb

## 2022-10-29 DIAGNOSIS — R079 Chest pain, unspecified: Secondary | ICD-10-CM

## 2022-10-29 DIAGNOSIS — M797 Fibromyalgia: Secondary | ICD-10-CM | POA: Diagnosis not present

## 2022-10-29 DIAGNOSIS — I10 Essential (primary) hypertension: Secondary | ICD-10-CM | POA: Diagnosis not present

## 2022-10-29 DIAGNOSIS — R42 Dizziness and giddiness: Secondary | ICD-10-CM | POA: Diagnosis not present

## 2022-10-29 MED ORDER — AMLODIPINE BESYLATE 2.5 MG PO TABS: 2.5000 mg | ORAL_TABLET | Freq: Every day | ORAL | 2 refills | Status: DC

## 2022-10-29 NOTE — Patient Instructions (Signed)
Medication Instructions:   START TAKING AMLODIPINE 2.5 MG BY MOUTH DAILY  *If you need a refill on your cardiac medications before your next appointment, please call your pharmacy*    Follow-Up: At [facility], you and your health needs are our priority.  As part of our continuing mission to provide you with exceptional heart care, we have created designated Provider Care Teams.  These Care Teams include your primary Cardiologist (physician) and Advanced Practice Providers (APPs -  Physician Assistants and Nurse Practitioners) who all work together to provide you with the care you need, when you need it.  We recommend signing up for the patient portal called "MyChart".  Sign up information is provided on this After Visit Summary.  MyChart is used to connect with patients for Virtual Visits (Telemedicine).  Patients are able to view lab/test results, encounter notes, upcoming appointments, etc.  Non-urgent messages can be sent to your provider as well.   To learn more about what you can do with MyChart, go to [email or web address].    Your next appointment:   6 month(s)  Provider:   DR. Johney Frame

## 2022-11-02 ENCOUNTER — Encounter: Payer: Self-pay | Admitting: Internal Medicine

## 2022-11-02 NOTE — Assessment & Plan Note (Signed)
Exacerbation asthmatic bronchitis Plan-Augmentin, chest x-ray, Depo-Medrol

## 2022-11-02 NOTE — Assessment & Plan Note (Signed)
Any active sinusitis should be covered by the Augmentin.  We will also refill cromolyn nasal spray.

## 2022-11-07 NOTE — Progress Notes (Unsigned)
No chief complaint on file.     ASSESSMENT AND PLAN  Samantha Samantha [name] is a 77 y.o. Samantha [name]   Cognitive impairment  MoCA examination 23/30  Family history of memory loss  MRI of the brain showed no significant abnormalities  Laboratory evaluation to rule out treatable etiology  At risk for obstructive sleep apnea  Daily headache, difficulty falling to sleep, staying asleep, snoring, daytime sleepiness, fatigue  Discussed with patient and her daughter, agree sleep study  DIAGNOSTIC DATA (LABS, IMAGING, TESTING) - I reviewed patient records, labs, notes, testing and imaging myself where available.  MRI brain 02/26/2022 -No acute intracranial process. No evidence of acute or subacute infarct.  Laboratory evaluation 04/2022 TSH 3.140, B12 982, RPR nonreactive  Laboratory evaluation showed normal negative ESR, C-reactive protein, CBC, CMP, with mild elevated glucose 134,     MEDICAL HISTORY:  Update 11/08/2022 JM: Returns for 28-monthfollow-up visit.   Evaluated by Dr. ARexene Alberts10/2023 for concern of underlying sleep apnea. Per epic, cancelled schedule HST back in October due to illness but has not yet been rescheduled.    Consult visit 05/07/2022 Dr. YKrista [name] LVIVIA HOXIEis a 77year old Samantha [name] seen in request by primary care doctor DElsie Stainfor evaluation of word finding difficulties, confusion, I was able to talk with her daughter SColletta Marylandon the phone,  I reviewed and summarized the referring note. PMHx. HTN Asthma HLD Right carpal tunnel syndromes   I saw her previously for right hand paresthesia, frequent headaches, since 2020,  EMG nerve conduction study in August 2020, confirmed the diagnosis of moderate right carpal tunnel syndromes  She complains almost daily headaches, CT head without contrast showed no significant abnormalities, mild small vessel disease  Multiple repeat ESR, C-reactive protein was within normal limit, she manage her headache by sleep,  Tylenol as needed  She also carries a diagnosis of fibromyalgia I have suggested Cymbalta in the past, worry about the side effect, she did not try  She retired from [employer] lives alone, complains of excessive daytime fatigue, sleepiness, poor sleep quality, already falling to sleep, difficulty staying asleep, does snore sometimes  She has slow worsening memory loss over the past few years, got lost while driving on the familiar route, misplace things, word finding difficulties, her mother did suffer memory loss in the 180s  She presented to the emergency room on February 25, 2022, after not function well for 2 days, word finding difficulties, lightheadedness, off-balance,  Personally reviewed MRI of the brain on February 26, 2022, no acute abnormality, age-appropriate changes  PHYSICAL EXAM:   There were no vitals filed for this visit.  Not recorded     There is no height or weight on file to calculate BMI.  PHYSICAL EXAMNIATION:  Gen: NAD, conversant, well nourised, well groomed                     Cardiovascular: Regular rate rhythm, no peripheral edema, warm, nontender. Eyes: Conjunctivae clear without exudates or hemorrhage Neck: Supple, no carotid bruits. Pulmonary: Clear to auscultation bilaterally   NEUROLOGICAL EXAM:  MENTAL STATUS: Speech/cognition: Awake, alert, oriented to history taking and casual conversation     05/07/2022    3:00 PM  Montreal Cognitive Assessment   Visuospatial/ Executive (0/5) 4  Naming (0/3) 3  Attention: Read list of digits (0/2) 2  Attention: Read list of letters (0/1) 1  Attention: Serial 7 subtraction starting at 100 (0/3) 1  Language: Repeat phrase (0/2) 2  Language : Fluency (0/1) 0  Abstraction (0/2) 2  Delayed Recall (0/5) 2  Orientation (0/6) 6  Total 23    CRANIAL NERVES: CN II: Visual fields are full to confrontation. Pupils are round equal and briskly reactive to light. CN III, IV, VI: extraocular movement are normal. No  ptosis. CN V: Facial sensation is intact to light touch CN VII: Face is symmetric with normal eye closure  CN VIII: Hearing is normal to causal conversation. CN IX, X: Phonation is normal. CN XI: Head turning and shoulder shrug are intact CN XII: Narrow oropharyngeal space  MOTOR: There is no pronator drift of out-stretched arms. Muscle bulk and tone are normal. Muscle strength is normal.  REFLEXES: Reflexes are 1 and symmetric at the biceps, triceps, knees, and ankles. Plantar responses are flexor.  SENSORY: Intact to light touch, pinprick and vibratory sensation are intact in fingers and toes.  COORDINATION: There is no trunk or limb dysmetria noted.  GAIT/STANCE: Get up from sitting position arm crossed steady gait    REVIEW OF SYSTEMS:  Full 14 system review of systems performed and notable only for as above All other review of systems were negative.   ALLERGIES: Allergies  Allergen Reactions   2,4-D Dimethylamine    Albuterol     Tachycardia   Cefuroxime Axetil     REACTION: pt not sure of reaction- she can tolerate amoxil   Doxycycline Itching   Iodinated Contrast Media Other (See Comments) and Itching   Latex Itching   Lisinopril     Cough   Lovastatin Other (See Comments)    Muscle aches    Sulfonamide Derivatives     REACTION: pt not sure of reaction    HOME MEDICATIONS: Current Outpatient Medications  Medication Sig Dispense Refill   albuterol (VENTOLIN HFA) 108 (90 Base) MCG/ACT inhaler Inhale 2 puffs into the lungs every 6 (six) hours as needed for wheezing or shortness of breath. 54 g 3   amLODipine (NORVASC) 2.5 MG tablet Take 1 tablet (2.5 mg total) by mouth daily. 90 tablet 2   Azelastine HCl 0.15 % SOLN 2 puffs each nostril twice daily as needed 90 mL 3   bifidobacterium infantis (ALIGN) capsule Take 1 capsule by mouth daily.     budesonide-formoterol (SYMBICORT) 160-4.5 MCG/ACT inhaler Inhale 2 puffs into the lungs 2 (two) times daily. Rinse  mouth 3 each 4   cetirizine (ZYRTEC) 10 MG tablet Take 10 mg by mouth at bedtime.      Cholecalciferol (VITAMIN D) 50 MCG (2000 UT) CAPS Take 1 capsule by mouth daily.     Coenzyme Q10 (CO Q 10) 60 MG CAPS 1 capsule with a meal     cromolyn (NASALCROM) 5.2 MG/ACT nasal spray Place 1 spray into both nostrils 2 (two) times daily as needed for allergies. 78 mL 3   hydrochlorothiazide (HYDRODIURIL) 12.5 MG tablet TAKE 1/2 TO 1 TABLET(6.25 TO 12.5 MG) BY MOUTH DAILY 90 tablet 2   levalbuterol (XOPENEX HFA) 45 MCG/ACT inhaler Inhale 1-2 puffs into the lungs every 6 (six) hours as needed for wheezing. 1 each 12   levalbuterol (XOPENEX) 1.25 MG/3ML nebulizer solution Take 1.25 mg by nebulization every 6 (six) hours as needed for wheezing. 72 mL 12   losartan (COZAAR) 50 MG tablet Take 1 tablet (50 mg total) by mouth daily. 90 tablet 3   Magnesium Malate POWD ('1250mg'$  tabs) 1 tablet by mouth qhs     Manganese 50 MG TABS Take 50 mg  by mouth at bedtime.      montelukast (SINGULAIR) 10 MG tablet Take 1 tablet (10 mg total) by mouth at bedtime. 90 tablet 3   Olopatadine HCl (PATADAY) 0.2 % SOLN Place 1 drop into both eyes daily as needed (itchy eyes).     pantoprazole (PROTONIX) 40 MG tablet Take 1 tablet (40 mg total) by mouth 2 (two) times daily. 180 tablet 3   Polyethyl Glycol-Propyl Glycol (SYSTANE) 0.4-0.3 % GEL ophthalmic gel Place 1 application into both eyes every 6 (six) hours as needed (dry eyes).     TART CHERRY PO Take by mouth. Take 1 tablet one time a day by mouth     thiamine (VITAMIN B-1) 50 MG tablet Take 100 mg by mouth daily.     TURMERIC PO Take 1 tablet by mouth daily.     No current facility-administered medications for this visit.    PAST MEDICAL HISTORY: Past Medical History:  Diagnosis Date   ALLERGIC RHINITIS 07/26/2008   ASTHMA 04/14/2007   ASYMPTOMATIC POSTMENOPAUSAL STATUS 05/10/2008   Bronchiectasis ([place])    CHEST PAIN 08/26/2008   COLONIC POLYPS, HX OF 04/14/2007    colonic leiomyoma   Decreased grip strength    Episcleritis    FIBROIDS, UTERUS 05/10/2008   Fibromyalgia    Gait abnormality    GERD 04/14/2007   HYPERCHOLESTEROLEMIA 01/07/2008   HYPERGLYCEMIA 11/28/2009   HYPERTENSION 01/28/2008   INSOMNIA 05/10/2008   Irritable bowel syndrome 04/06/2010   OSTEOARTHRITIS 11/28/2009   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    PAST SURGICAL HISTORY: Past Surgical History:  Procedure Laterality Date   COLONOSCOPY W/ POLYPECTOMY     ELECTROCARDIOGRAM  12/04/2006   IR RADIOLOGY PERIPHERAL GUIDED IV START  04/21/2018   IR US GUIDE VASC ACCESS LEFT  04/21/2018   NASAL SEPTUM SURGERY     Stress Cardiolite  02/13/2002    FAMILY HISTORY: Family History  Problem Relation Age of Onset   Diabetes Mother    Hypertension Mother    Glaucoma Mother    Polymyositis Mother    Heart disease Father        CHF   Allergic rhinitis Father    Asthma Father    Other Father        sideoblastic anemia   Allergic rhinitis Sister    Asthma Sister    Allergic rhinitis Brother    Asthma Brother    Prostate cancer Brother    Sleep apnea Brother    Allergic rhinitis Maternal Aunt    Asthma Maternal Aunt    Allergic rhinitis Paternal Aunt    Asthma Paternal Aunt    Breast cancer Cousin    Colon cancer Cousin    Cancer Neg Hx        No FH of Colon Cancer   Angioedema Neg Hx    Atopy Neg Hx    Immunodeficiency Neg Hx    Urticaria Neg Hx    Eczema Neg Hx     SOCIAL HISTORY: Social History   Socioeconomic History   Marital status: Widowed    Spouse name: Not on file   Number of children: 3   Years of education: some college   Highest education level: Not on file  Occupational History    Employer: RETIRED    Comment: Retired Stage manager)  Tobacco Use   Smoking status: Never    Passive exposure: Never   Smokeless tobacco: Never  Vaping Use   Vaping Use: Never used  Substance and Sexual  Activity   Alcohol use: No   Drug use: No   Sexual activity:  Never  Other Topics Concern   Not on file  Social History Narrative   Lives alone (widowed 1998).   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      2 sons and 1 daughter.        Right-handed.   No daily caffeine use.   Social Determinants of Health   Financial Resource Strain: Low Risk  (07/12/2020)   Overall Financial Resource Strain (CARDIA)    Difficulty of Paying Living Expenses: Not hard at all  Food Insecurity: No Food Insecurity (07/12/2020)   Hunger Vital Sign    Worried About Running Out of Food in the Last Year: Never true    Ran Out of Food in the Last Year: Never true  Transportation Needs: No Transportation Needs (07/12/2020)   PRAPARE - Hydrologist (Medical): No    Lack of Transportation (Non-Medical): No  Physical Activity: Insufficiently Active (07/12/2020)   Exercise Vital Sign    Days of Exercise per Week: 2 days    Minutes of Exercise per Session: 60 min  Stress: No Stress Concern Present (07/12/2020)   [place]    Feeling of Stress : Not at all  Social Connections: Not on file  Intimate Partner Violence: Not At Risk (07/12/2020)   Humiliation, Afraid, Rape, and Kick questionnaire    Fear of Current or Ex-Partner: No    Emotionally Abused: No    Physically Abused: No    Sexually Abused: No      I spent *** minutes of face-to-face and non-face-to-face time with patient.  This included previsit chart review, lab review, study review, order entry, electronic health record documentation, patient education  Frann Rider, [facility]  [facility] [address] [place], [place] [address]  Phone [phone] Fax [phone] Note: This document was prepared with digital dictation and possible smart phrase technology. Any transcriptional errors that result from this process are unintentional.

## 2022-11-08 ENCOUNTER — Ambulatory Visit (INDEPENDENT_AMBULATORY_CARE_PROVIDER_SITE_OTHER): Payer: Medicare Other | Admitting: Adult Health

## 2022-11-08 ENCOUNTER — Encounter: Payer: Self-pay | Admitting: Adult Health

## 2022-11-08 VITALS — BP 127/64 | HR 67 | Ht 62.0 in | Wt 137.0 lb

## 2022-11-08 DIAGNOSIS — R7303 Prediabetes: Secondary | ICD-10-CM

## 2022-11-08 DIAGNOSIS — Z9189 Other specified personal risk factors, not elsewhere classified: Secondary | ICD-10-CM

## 2022-11-08 DIAGNOSIS — G629 Polyneuropathy, unspecified: Secondary | ICD-10-CM | POA: Diagnosis not present

## 2022-11-08 DIAGNOSIS — R4189 Other symptoms and signs involving cognitive functions and awareness: Secondary | ICD-10-CM | POA: Diagnosis not present

## 2022-11-08 NOTE — Patient Instructions (Addendum)
Your Plan:  Your memory test is stable compared to prior visit 6 months ago. Recommend routinely doing memory and physical exercises at home as this can help with memory. Can consider use of Namenda or Aricpet in the future if your memory starts to decline, these would likely not be of any benefit currently as your memory has been stable  - this can be further discussed with your PCP Dr. Damita Dunnings if these are needed in the future.   Please reschedule sleep study as previously recommended by Dr. Rexene Alberts as underlying sleep apnea can contribute to memory difficulties   We will check A1c today to look for diabetes, if this is normal, will discuss further testing with Dr. Krista Blue. If elevated, likely the cause of your neuropathy and focus should be on symptom management which can be done with your primary doctor.       Thank you for coming to see [place] at [facility]. I hope we have been able to provide you high quality care today.  You may receive a patient satisfaction survey over the next few weeks. We would appreciate your feedback and comments so that we may continue to improve ourselves and the health of our patients.

## 2022-11-09 LAB — HEMOGLOBIN A1C
Est. average glucose Bld gHb Est-mCnc: 128 mg/dL
Hgb A1c MFr Bld: 6.1 % — ABNORMAL HIGH (ref 4.8–5.6)

## 2022-11-12 ENCOUNTER — Telehealth: Payer: Self-pay | Admitting: Family Medicine

## 2022-11-12 NOTE — Telephone Encounter (Signed)
Contacted Samantha [name] to schedule their annual wellness visit. Appointment made for 12/19/2022.  [place] Direct Dial: [phone]

## 2022-11-16 DIAGNOSIS — H40003 Preglaucoma, unspecified, bilateral: Secondary | ICD-10-CM | POA: Diagnosis not present

## 2022-11-16 DIAGNOSIS — H2513 Age-related nuclear cataract, bilateral: Secondary | ICD-10-CM | POA: Diagnosis not present

## 2022-11-24 NOTE — Progress Notes (Signed)
Patient ID: Samantha [name], female    DOB: [birth year], 77 y.o.   MRN: [id number]  HPI female never smoker followed for allergic rhinitis, asthma, bronchiectasis,  complicated by GERD/ LPR, Raynaud's, HBP, IBS, Glaucoma , Aortic Atherosclerosis,  Barium swallow 01/09/2016-normal PFT 06/05/2019- Mild restriction, normal flows and Diffusion -------------------------------------------------------------------------------------- .  10/01/22-  77 year old female never smoker followed for Allergic Rhinitis, Asthma, Bronchiectasis, complicated by GERD/ LPR, Raynaud's, HBP, IBS, Glaucoma -Albuterol hfa, azelastine nasal, Qnasl, Symbicort 160, singulair, Zyrtec, Protonix, -Carafate, Covid vax-4 Phizer Flu vax- Lab 04/10/21- EOS wnl, IgE 15 wnl Covid test 10/01/22-  -----Symptoms started on 09/22/22. Productive cough with white/brown phlegm, bilateral rib pain due to excess coughing, sob, wheezing Had levaquin in November Onset January 13 with head congestion spreading his bronchitis into chest.  Some cough.  She has tussive bilateral rib pains, laryngitis.  Thick white/brown mucus.  No blood and apparently no fever.  11/27/22- 77 year old female never smoker followed for Allergic Rhinitis, Asthma, Bronchiectasis, complicated by GERD/ LPR, Raynaud's, HBP, IBS, Glaucoma -Albuterol hfa, azelastine nasal, Nasalcrom, Qnasl, Symbicort 160, singulair, Zyrtec, Protonix, -Carafate, Covid vax-4 Phizer Flu vax- Lab 04/10/21- EOS wnl, IgE 15 wnl Notices sternal pain, nonexertional and affected by movement.  Has seen cardiologist.  May be 1 pain and etiologies have not been sorted yet. She admits she is tired during the day with some difficulty sleeping at times at night.  She sleeps alone.  Aware that she snores..  She relates that a long time ago over there had been discussion of doing the sleep study by one of her other physicians but it was never done.  After discussion today, she is interested in getting a home  sleep test. She is enthusiastic about the benefits of drinking cucumber water to soothe her throat. CXR 10/04/22-  IMPRESSION: Chronic interstitial prominence. No convincing pneumonia since radiograph last month.  Review of Systems-see HPI   + = positive Constitutional:   No weight loss, night sweats,  Fevers, chills, fatigue, lassitude. HEENT:   No headaches,  Difficulty swallowing,  Tooth/dental problems,  Sore throat,                No sneezing, itching, or ear ache,   nasal congestion, post nasal drip, CV:  + chest pain, orthopnea, PND, swelling in lower extremities, anasarca, dizziness, palpitations GI  No heartburn, indigestion, abdominal pain, nausea, vomiting,  Resp: No shortness of breath with exertion or at rest.  No excess mucus, +productive cough,  +-non-productive cough,  No coughing up of blood.  No change in color of mucus.  +infrequent wheezing.   Skin: Clear GU:  MS:  + joint pain or swelling.   Psych:  No change in mood or affect. No depression or anxiety.  No memory loss.   Objective:   Physical Exam General- Alert, Oriented, Affect-appropriate, Distress- none acute; pleasant  Skin- clear Lymphadenopathy- none Head- atraumatic            Eyes- Gross vision intact, PERRLA, conjunctivae clear secretions,            Ears- Normal hearing            Nose- clear, no-Septal dev, mucus, polyps, erosion, perforation .                        Throat- Malampatti III.   TMs not  retracted , mucosa not red, drainage- none, tonsils- atrophic;   Neck- flexible , trachea midline,  no stridor , thyroid nl, carotid no bruit Chest - symmetrical excursion , unlabored           Heart/CV- RRR , no murmur , no gallop  , no rub, nl s1 s2                           - JVD- none , edema-none, stasis changes- none, varices- none           Lung-  +crackles, cough+dry , dullness-none, rub- none, crackles- none           Chest wall-  Abd-  Br/ Gen/ Rectal- Not done, not indicated Extrem-  cyanosis- none, clubbing, none, atrophy- none, strength- nl.  Neuro- grossly intact to observation

## 2022-11-27 ENCOUNTER — Ambulatory Visit: Payer: Medicare PPO | Admitting: Internal Medicine

## 2022-11-27 ENCOUNTER — Encounter: Payer: Self-pay | Admitting: Internal Medicine

## 2022-11-27 VITALS — BP 130/70 | HR 66 | Ht 62.0 in | Wt 137.2 lb

## 2022-11-27 DIAGNOSIS — R0683 Snoring: Secondary | ICD-10-CM | POA: Diagnosis not present

## 2022-11-27 DIAGNOSIS — J3089 Other allergic rhinitis: Secondary | ICD-10-CM

## 2022-11-27 DIAGNOSIS — J479 Bronchiectasis, uncomplicated: Secondary | ICD-10-CM

## 2022-11-27 DIAGNOSIS — J302 Other seasonal allergic rhinitis: Secondary | ICD-10-CM | POA: Diagnosis not present

## 2022-11-27 NOTE — Patient Instructions (Signed)
Order- schedule home sleep test   dx snoring  Please call us about 2 weeks after your sleep test for results and recommendations 

## 2022-12-06 DIAGNOSIS — R490 Dysphonia: Secondary | ICD-10-CM | POA: Diagnosis not present

## 2022-12-06 DIAGNOSIS — K219 Gastro-esophageal reflux disease without esophagitis: Secondary | ICD-10-CM | POA: Diagnosis not present

## 2022-12-06 DIAGNOSIS — L298 Other pruritus: Secondary | ICD-10-CM | POA: Diagnosis not present

## 2022-12-16 ENCOUNTER — Other Ambulatory Visit: Payer: Self-pay | Admitting: Family Medicine

## 2022-12-16 DIAGNOSIS — E559 Vitamin D deficiency, unspecified: Secondary | ICD-10-CM

## 2022-12-16 DIAGNOSIS — E7800 Pure hypercholesterolemia, unspecified: Secondary | ICD-10-CM

## 2022-12-16 DIAGNOSIS — R5383 Other fatigue: Secondary | ICD-10-CM

## 2022-12-17 ENCOUNTER — Encounter: Payer: Medicare Other | Admitting: Family Medicine

## 2022-12-19 ENCOUNTER — Ambulatory Visit: Payer: Medicare PPO

## 2022-12-19 ENCOUNTER — Other Ambulatory Visit (INDEPENDENT_AMBULATORY_CARE_PROVIDER_SITE_OTHER): Payer: Medicare PPO

## 2022-12-19 VITALS — Ht 62.0 in | Wt 134.0 lb

## 2022-12-19 DIAGNOSIS — E7800 Pure hypercholesterolemia, unspecified: Secondary | ICD-10-CM

## 2022-12-19 DIAGNOSIS — E559 Vitamin D deficiency, unspecified: Secondary | ICD-10-CM | POA: Diagnosis not present

## 2022-12-19 DIAGNOSIS — Z0000 Encounter for general adult medical examination without abnormal findings: Secondary | ICD-10-CM | POA: Diagnosis not present

## 2022-12-19 DIAGNOSIS — Z1231 Encounter for screening mammogram for malignant neoplasm of breast: Secondary | ICD-10-CM

## 2022-12-19 LAB — LIPID PANEL
Cholesterol: 229 mg/dL — ABNORMAL HIGH (ref 0–200)
HDL: 103.3 mg/dL (ref >39.00)
LDL Cholesterol: 109 mg/dL — ABNORMAL HIGH (ref 0–99)
NonHDL: 125.77
Total CHOL/HDL Ratio: 2
Triglycerides: 84 mg/dL (ref 0.0–149.0)
VLDL: 16.8 mg/dL (ref 0.0–40.0)

## 2022-12-19 LAB — CBC WITH DIFFERENTIAL/PLATELET
Basophils Absolute: 0 10*3/uL (ref 0.0–0.1)
Basophils Relative: 0.4 % (ref 0.0–3.0)
Eosinophils Absolute: 0.1 10*3/uL (ref 0.0–0.7)
Eosinophils Relative: 0.9 % (ref 0.0–5.0)
HCT: 38.6 % (ref 36.0–46.0)
Hemoglobin: 13.1 g/dL (ref 12.0–15.0)
Lymphocytes Relative: 37.9 % (ref 12.0–46.0)
Lymphs Abs: 2.1 10*3/uL (ref 0.7–4.0)
MCHC: 34 g/dL (ref 30.0–36.0)
MCV: 86.5 fl (ref 78.0–100.0)
Monocytes Absolute: 0.3 10*3/uL (ref 0.1–1.0)
Monocytes Relative: 6.1 % (ref 3.0–12.0)
Neutro Abs: 3 10*3/uL (ref 1.4–7.7)
Neutrophils Relative %: 54.7 % (ref 43.0–77.0)
Platelets: 205 10*3/uL (ref 150.0–400.0)
RBC: 4.46 Mil/uL (ref 3.87–5.11)
RDW: 13.9 % (ref 11.5–15.5)
WBC: 5.4 10*3/uL (ref 4.0–10.5)

## 2022-12-19 LAB — COMPREHENSIVE METABOLIC PANEL
ALT: 10 U/L (ref 0–35)
AST: 20 U/L (ref 0–37)
Albumin: 4.4 g/dL (ref 3.5–5.2)
Alkaline Phosphatase: 63 U/L (ref 39–117)
BUN: 16 mg/dL (ref 6–23)
CO2: 30 mEq/L (ref 19–32)
Calcium: 10.1 mg/dL (ref 8.4–10.5)
Chloride: 104 mEq/L (ref 96–112)
Creatinine, Ser: 0.92 mg/dL (ref 0.40–1.20)
GFR: 60.12 mL/min (ref >60.00)
Glucose, Bld: 93 mg/dL (ref 70–99)
Potassium: 4.3 mEq/L (ref 3.5–5.1)
Sodium: 142 mEq/L (ref 135–145)
Total Bilirubin: 0.5 mg/dL (ref 0.2–1.2)
Total Protein: 6.9 g/dL (ref 6.0–8.3)

## 2022-12-19 LAB — TSH: TSH: 4.37 u[IU]/mL (ref 0.35–5.50)

## 2022-12-19 LAB — VITAMIN D 25 HYDROXY (VIT D DEFICIENCY, FRACTURES): VITD: 47.64 ng/mL (ref 30.00–100.00)

## 2022-12-19 NOTE — Patient Instructions (Signed)
Samantha [name] , Thank you for taking time to come for your Medicare Wellness Visit. I appreciate your ongoing commitment to your health goals. Please review the following plan we discussed and let me know if I can assist you in the future.   These are the goals we discussed:  Goals      Increase physical activity     Starting 07/09/2018, I will continue to drink at least 6-8 glasses of water daily.      Patient Stated     07/13/2019, I will exercise more daily and try to walk on a daily basis.      Patient Stated     07/12/2020, I will continue physical therapy 2 days a week for 1 hour.     Patient Stated     Eat more healthy.        This is a list of the screening recommended for you and due dates:  Health Maintenance  Topic Date Due   COVID-19 Vaccine (5 - 2023-24 season) 05/11/2022   Flu Shot  04/11/2023   DTaP/Tdap/Td vaccine (3 - Td or Tdap) 07/04/2023   Medicare Annual Wellness Visit  12/19/2023   Colon Cancer Screening  01/14/2025   Pneumonia Vaccine  Completed   DEXA scan (bone density measurement)  Completed   Hepatitis C Screening: USPSTF Recommendation to screen - Ages 47-79 yo.  Completed   Zoster (Shingles) Vaccine  Completed   HPV Vaccine  Aged Out    Advanced directives: Advance directive discussed with you today. Even though you declined this today, please call our office should you change your mind, and we can give you the proper paperwork for you to fill out.   Conditions/risks identified: Aim for 30 minutes of exercise or brisk walking, 6-8 glasses of water, and 5 servings of fruits and vegetables each day.   Next appointment: Follow up in one year for your annual wellness visit 12/23/23 @ 10;30 televisit   Preventive Care 65 Years and Older, Female Preventive care refers to lifestyle choices and visits with your health care provider that can promote health and wellness. What does preventive care include? A yearly physical exam. This is also called an  annual well check. Dental exams once or twice a year. Routine eye exams. Ask your health care provider how often you should have your eyes checked. Personal lifestyle choices, including: Daily care of your teeth and gums. Regular physical activity. Eating a healthy diet. Avoiding tobacco and drug use. Limiting alcohol use. Practicing safe sex. Taking low-dose aspirin every day. Taking vitamin and mineral supplements as recommended by your health care provider. What happens during an annual well check? The services and screenings done by your health care provider during your annual well check will depend on your age, overall health, lifestyle risk factors, and family history of disease. Counseling  Your health care provider may ask you questions about your: Alcohol use. Tobacco use. Drug use. Emotional well-being. Home and relationship well-being. Sexual activity. Eating habits. History of falls. Memory and ability to understand (cognition). Work and work Astronomer. Reproductive health. Screening  You may have the following tests or measurements: Height, weight, and BMI. Blood pressure. Lipid and cholesterol levels. These may be checked every 5 years, or more frequently if you are over 81 years old. Skin check. Lung cancer screening. You may have this screening every year starting at age 71 if you have a 30-pack-year history of smoking and currently smoke or have quit within the past  15 years. Fecal occult blood test (FOBT) of the stool. You may have this test every year starting at age 78. Flexible sigmoidoscopy or colonoscopy. You may have a sigmoidoscopy every 5 years or a colonoscopy every 10 years starting at age 78. Hepatitis C blood test. Hepatitis B blood test. Sexually transmitted disease (STD) testing. Diabetes screening. This is done by checking your blood sugar (glucose) after you have not eaten for a while (fasting). You may have this done every 1-3 years. Bone  density scan. This is done to screen for osteoporosis. You may have this done starting at age 78. Mammogram. This may be done every 1-2 years. Talk to your health care provider about how often you should have regular mammograms. Talk with your health care provider about your test results, treatment options, and if necessary, the need for more tests. Vaccines  Your health care provider may recommend certain vaccines, such as: Influenza vaccine. This is recommended every year. Tetanus, diphtheria, and acellular pertussis (Tdap, Td) vaccine. You may need a Td booster every 10 years. Zoster vaccine. You may need this after age 10760. Pneumococcal 13-valent conjugate (PCV13) vaccine. One dose is recommended after age 10565. Pneumococcal polysaccharide (PPSV23) vaccine. One dose is recommended after age 78. Talk to your health care provider about which screenings and vaccines you need and how often you need them. This information is not intended to replace advice given to you by your health care provider. Make sure you discuss any questions you have with your health care provider. Document Released: 09/23/2015 Document Revised: 05/16/2016 Document Reviewed: 06/28/2015 Elsevier Interactive Patient Education  2017 [employer]  Fall Prevention in the Home Falls can cause injuries. They can happen to people of all ages. There are many things you can do to make your home safe and to help prevent falls. What can I do on the outside of my home? Regularly fix the edges of walkways and driveways and fix any cracks. Remove anything that might make you trip as you walk through a door, such as a raised step or threshold. Trim any bushes or trees on the path to your home. Use bright outdoor lighting. Clear any walking paths of anything that might make someone trip, such as rocks or tools. Regularly check to see if handrails are loose or broken. Make sure that both sides of any steps have handrails. Any raised  decks and porches should have guardrails on the edges. Have any leaves, snow, or ice cleared regularly. Use sand or salt on walking paths during winter. Clean up any spills in your garage right away. This includes oil or grease spills. What can I do in the bathroom? Use night lights. Install grab bars by the toilet and in the tub and shower. Do not use towel bars as grab bars. Use non-skid mats or decals in the tub or shower. If you need to sit down in the shower, use a plastic, non-slip stool. Keep the floor dry. Clean up any water that spills on the floor as soon as it happens. Remove soap buildup in the tub or shower regularly. Attach bath mats securely with double-sided non-slip rug tape. Do not have throw rugs and other things on the floor that can make you trip. What can I do in the bedroom? Use night lights. Make sure that you have a light by your bed that is easy to reach. Do not use any sheets or blankets that are too big for your bed. They should not hang  down onto the floor. Have a firm chair that has side arms. You can use this for support while you get dressed. Do not have throw rugs and other things on the floor that can make you trip. What can I do in the kitchen? Clean up any spills right away. Avoid walking on wet floors. Keep items that you use a lot in easy-to-reach places. If you need to reach something above you, use a strong step stool that has a grab bar. Keep electrical cords out of the way. Do not use floor polish or wax that makes floors slippery. If you must use wax, use non-skid floor wax. Do not have throw rugs and other things on the floor that can make you trip. What can I do with my stairs? Do not leave any items on the stairs. Make sure that there are handrails on both sides of the stairs and use them. Fix handrails that are broken or loose. Make sure that handrails are as long as the stairways. Check any carpeting to make sure that it is firmly attached  to the stairs. Fix any carpet that is loose or worn. Avoid having throw rugs at the top or bottom of the stairs. If you do have throw rugs, attach them to the floor with carpet tape. Make sure that you have a light switch at the top of the stairs and the bottom of the stairs. If you do not have them, ask someone to add them for you. What else can I do to help prevent falls? Wear shoes that: Do not have high heels. Have rubber bottoms. Are comfortable and fit you well. Are closed at the toe. Do not wear sandals. If you use a stepladder: Make sure that it is fully opened. Do not climb a closed stepladder. Make sure that both sides of the stepladder are locked into place. Ask someone to hold it for you, if possible. Clearly mark and make sure that you can see: Any grab bars or handrails. First and last steps. Where the edge of each step is. Use tools that help you move around (mobility aids) if they are needed. These include: Canes. Walkers. Scooters. Crutches. Turn on the lights when you go into a dark area. Replace any light bulbs as soon as they burn out. Set up your furniture so you have a clear path. Avoid moving your furniture around. If any of your floors are uneven, fix them. If there are any pets around you, be aware of where they are. Review your medicines with your doctor. Some medicines can make you feel dizzy. This can increase your chance of falling. Ask your doctor what other things that you can do to help prevent falls. This information is not intended to replace advice given to you by your health care provider. Make sure you discuss any questions you have with your health care provider. Document Released: 06/23/2009 Document Revised: 02/02/2016 Document Reviewed: 10/01/2014 Elsevier Interactive Patient Education  2017 [employer].

## 2022-12-19 NOTE — Progress Notes (Signed)
I connected with  [name] on 12/19/22 by a audio enabled telemedicine application and verified that I am speaking with the correct person using two identifiers.  Patient Location: Other:  in parked car.  Provider Location: Office/Clinic  I discussed the limitations of evaluation and management by telemedicine. The patient expressed understanding and agreed to proceed.  Subjective:   AILANIE RUTTAN is a 77 y.o. female who presents for Medicare Annual (Subsequent) preventive examination.  Review of Systems      Cardiac Risk Factors include: advanced age (>22men, >59 women);hypertension;sedentary lifestyle     Objective:    Today's Vitals   12/19/22 1036  Weight: 134 lb (60.8 kg)  Height: 5\' 2"  (1.575 m)   Body mass index is 24.51 kg/m.     12/19/2022   10:54 AM 02/26/2022    1:00 PM 07/12/2020    2:09 PM 05/23/2020    9:13 AM 07/13/2019   10:36 AM 03/10/2019    1:04 PM 07/09/2018   10:18 AM  Advanced Directives  Does Patient Have a Medical Advance Directive? No No No Yes No Yes No  Type of Estate agent of [place];Living will     Healthcare Power of Attorney   Does patient want to make changes to medical advance directive?    No - Patient declined     Copy of Healthcare Power of Attorney in Chart? No - copy requested        Would patient like information on creating a medical advance directive?  No - Patient declined No - Patient declined  Yes (MAU/Ambulatory/Procedural Areas - Information given)  Yes (MAU/Ambulatory/Procedural Areas - Information given)    Current Medications (verified) Outpatient Encounter Medications as of 12/19/2022  Medication Sig   albuterol (VENTOLIN HFA) 108 (90 Base) MCG/ACT inhaler Inhale 2 puffs into the lungs every 6 (six) hours as needed for wheezing or shortness of breath.   Azelastine HCl 0.15 % SOLN 2 puffs each nostril twice daily as needed   bifidobacterium infantis (ALIGN) capsule Take 1 capsule by mouth daily.    budesonide-formoterol (SYMBICORT) 160-4.5 MCG/ACT inhaler Inhale 2 puffs into the lungs 2 (two) times daily. Rinse mouth   cetirizine (ZYRTEC) 10 MG tablet Take 10 mg by mouth at bedtime.    Cholecalciferol (VITAMIN D) 50 MCG (2000 UT) CAPS Take 1 capsule by mouth daily.   Coenzyme Q10 (CO Q 10) 60 MG CAPS 1 capsule with a meal   hydrochlorothiazide (HYDRODIURIL) 12.5 MG tablet TAKE 1/2 TO 1 TABLET(6.25 TO 12.5 MG) BY MOUTH DAILY   levalbuterol (XOPENEX) 1.25 MG/3ML nebulizer solution Take 1.25 mg by nebulization every 6 (six) hours as needed for wheezing.   losartan (COZAAR) 50 MG tablet Take 1 tablet (50 mg total) by mouth daily.   Magnesium Malate POWD (1250mg  tabs) 1 tablet by mouth qhs   Manganese 50 MG TABS Take 50 mg by mouth at bedtime.    montelukast (SINGULAIR) 10 MG tablet Take 1 tablet (10 mg total) by mouth at bedtime.   Olopatadine HCl (PATADAY) 0.2 % SOLN Place 1 drop into both eyes daily as needed (itchy eyes).   pantoprazole (PROTONIX) 40 MG tablet Take 1 tablet (40 mg total) by mouth 2 (two) times daily.   Polyethyl Glycol-Propyl Glycol (SYSTANE) 0.4-0.3 % GEL ophthalmic gel Place 1 application into both eyes every 6 (six) hours as needed (dry eyes).   TART CHERRY PO Take by mouth. Take 1 tablet one time a day by mouth  thiamine (VITAMIN B-1) 50 MG tablet Take 100 mg by mouth daily.   amLODipine (NORVASC) 2.5 MG tablet Take 1 tablet (2.5 mg total) by mouth daily. (Patient not taking: Reported on 12/19/2022)   cromolyn (NASALCROM) 5.2 MG/ACT nasal spray Place 1 spray into both nostrils 2 (two) times daily as needed for allergies. (Patient not taking: Reported on 12/19/2022)   levalbuterol (XOPENEX HFA) 45 MCG/ACT inhaler Inhale 1-2 puffs into the lungs every 6 (six) hours as needed for wheezing. (Patient not taking: Reported on 12/19/2022)   TURMERIC PO Take 1 tablet by mouth daily. (Patient not taking: Reported on 12/19/2022)   No facility-administered encounter medications on  file as of 12/19/2022.    Allergies (verified) 2,4-d dimethylamine; Albuterol; Cefuroxime axetil; Doxycycline; Iodinated contrast media; Latex; Lisinopril; Lovastatin; and Sulfonamide derivatives   History: Past Medical History:  Diagnosis Date   ALLERGIC RHINITIS 07/26/2008   ASTHMA 04/14/2007   ASYMPTOMATIC POSTMENOPAUSAL STATUS 05/10/2008   Bronchiectasis    CHEST PAIN 08/26/2008   COLONIC POLYPS, HX OF 04/14/2007   colonic leiomyoma   Decreased grip strength    Episcleritis    FIBROIDS, UTERUS 05/10/2008   Fibromyalgia    Gait abnormality    GERD 04/14/2007   HYPERCHOLESTEROLEMIA 01/07/2008   HYPERGLYCEMIA 11/28/2009   HYPERTENSION 01/28/2008   INSOMNIA 05/10/2008   Irritable bowel syndrome 04/06/2010   OSTEOARTHRITIS 11/28/2009   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   Past Surgical History:  Procedure Laterality Date   COLONOSCOPY W/ POLYPECTOMY     ELECTROCARDIOGRAM  12/04/2006   IR RADIOLOGY PERIPHERAL GUIDED IV START  04/21/2018   IR US GUIDE VASC ACCESS LEFT  04/21/2018   NASAL SEPTUM SURGERY     Stress Cardiolite  02/13/2002   Family History  Problem Relation Age of Onset   Diabetes Mother    Hypertension Mother    Glaucoma Mother    Polymyositis Mother    Heart disease Father        CHF   Allergic rhinitis Father    Asthma Father    Other Father        sideoblastic anemia   Allergic rhinitis Sister    Asthma Sister    Allergic rhinitis Brother    Asthma Brother    Prostate cancer Brother    Sleep apnea Brother    Allergic rhinitis Maternal Aunt    Asthma Maternal Aunt    Allergic rhinitis Paternal Aunt    Asthma Paternal Aunt    Breast cancer Cousin    Colon cancer Cousin    Cancer Neg Hx        No FH of Colon Cancer   Angioedema Neg Hx    Atopy Neg Hx    Immunodeficiency Neg Hx    Urticaria Neg Hx    Eczema Neg Hx    Social History   Socioeconomic History   Marital status: Widowed    Spouse name: Not on file   Number of children: 3   Years  of education: some college   Highest education level: Not on file  Occupational History    Employer: RETIRED    Comment: Retired Journalist, 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      2 sons and 1 daughter.        Right-handed.   No daily caffeine use.   Social Determinants of Health   Financial Resource Strain: Low Risk  (12/19/2022)   Overall Financial Resource Strain (CARDIA)    Difficulty of Paying Living Expenses: Not hard at all  Food Insecurity: No Food Insecurity (12/19/2022)   Hunger Vital Sign    Worried About Running Out of Food in the Last Year: Never true    Ran Out of Food in the Last Year: Never true  Transportation Needs: No Transportation Needs (12/19/2022)   PRAPARE - Administrator, Civil Service (Medical): No    Lack of Transportation (Non-Medical): No  Physical Activity: Inactive (12/19/2022)   Exercise Vital Sign    Days of Exercise per Week: 0 days    Minutes of Exercise per Session: 0 min  Stress: No Stress Concern Present (12/19/2022)   [employer] of Occupational Health - Occupational Stress Questionnaire    Feeling of Stress : Not at all  Social Connections: Moderately Isolated (12/19/2022)   Social Connection and Isolation Panel [NHANES]    Frequency of Communication with Friends and Family: More than three times a week    Frequency of Social Gatherings with Friends and Family: Twice a week    Attends Religious Services: More than 4 times per year    Active Member of [employer] or Organizations: No    Attends Banker Meetings: Never    Marital Status: Widowed    Tobacco Counseling Counseling given:  Not Answered   Clinical Intake:  Pre-visit preparation completed: Yes  Pain : No/denies pain     Nutritional Risks: None Diabetes: No  How often do you need to have someone help you when you read instructions, pamphlets, or other written materials from your doctor or pharmacy?: 1 - Never  Diabetic? no  Interpreter Needed?: No  Information entered by :: C.Valen Mascaro LPN   Activities of Daily Living    12/19/2022   10:56 AM  In your present state of health, do you have any difficulty performing the following activities:  Hearing? 0  Vision? 0  Difficulty concentrating or making decisions? 1  Comment occasionally  Walking or climbing stairs? 1  Comment leg issue  Dressing or bathing? 0  Doing errands, shopping? 0  Preparing Food and eating ? N  Using the Toilet? N  In the past six months, have you accidently leaked urine? N  Do you have problems with loss of bowel control? N  Managing your Medications? N  Managing your Finances? N  Housekeeping or managing your Housekeeping? N    Patient Care Team: Joaquim Nam, MD as PCP - General (Family Medicine) Waymon Budge, MD (Pulmonary Disease) Meryl Dare, MD (Gastroenterology) Pollyann Savoy, MD (Rheumatology) Richardean Chimera, MD as Consulting Physician (Obstetrics and Gynecology) Lockie Mola, MD as Referring Physician (Ophthalmology) Suzanna Obey, MD as Consulting Physician (Otolaryngology) Marcene Corning, MD as Consulting Physician (Orthopedic Surgery) Cherlyn Roberts, MD as Consulting Physician (Dermatology) Adolphus Birchwood, MD as Consulting Physician (Obstetrics and Gynecology) Kathi Der, MD as Consulting Physician (Gastroenterology) Richardean Chimera, MD as Consulting Physician (Obstetrics and Gynecology) Lucie Leather Alvira Philips, MD as Consulting Physician (Allergy and Immunology)  Indicate any recent Medical Services you may have received from other than [facility] providers in the past year (date may be  approximate).     Assessment:   This is a routine wellness examination for [place].  Hearing/Vision screen Hearing Screening - Comments:: No aids Vision Screening - Comments:: Glasses -[facility]  Dietary issues and exercise activities discussed: Current Exercise Habits: The patient does not participate in regular exercise at present, Exercise limited by: None identified   Goals Addressed             This Visit's Progress    Patient Stated       Eat more healthy.       Depression Screen    12/19/2022   10:54 AM 12/01/2021    2:29 PM 07/12/2020    2:12 PM 07/13/2019   10:38 AM 07/09/2018   10:07 AM 07/03/2017   12:58 PM 06/27/2016   10:53 AM  PHQ 2/9 Scores  PHQ - 2 Score 0 0 0 0 0 1 0  PHQ- 9 Score   0 0 0 7     Fall Risk    12/19/2022   10:56 AM 12/01/2021    2:29 PM 07/12/2020    2:11 PM 07/13/2019   10:38 AM 07/28/2018   10:50 AM  Fall Risk   Falls in the past year? 0 0 0 0 0  Comment     Emmi Telephone Survey: data to providers prior to load  Number falls in past yr: 0 0 0 0   Injury with Fall? 0 0 0 0   Risk for fall due to : No Fall Risks No Fall Risks Medication side effect Medication side effect;Impaired balance/gait   Follow up Falls prevention discussed;Falls evaluation completed Falls evaluation completed Falls evaluation completed;Falls prevention discussed Falls evaluation completed;Falls prevention discussed     FALL RISK PREVENTION PERTAINING TO THE HOME:  Any stairs in or around the home? No  If so, are there any without handrails? No  Home free of loose throw rugs in walkways, pet beds, electrical cords, etc? Yes  Adequate lighting in your home to reduce risk of falls? Yes   ASSISTIVE DEVICES UTILIZED TO PREVENT FALLS:  Life alert? Yes  Use of a cane, walker or w/c? No  Grab bars in the bathroom? No  Shower chair or bench in shower? No  Elevated toilet seat or a handicapped toilet? No   Cognitive Function:    07/12/2020    2:15 PM  07/13/2019   10:45 AM 07/09/2018   10:07 AM 07/03/2017   12:58 PM 06/27/2016   10:53 AM  MMSE - Mini Mental State Exam  Orientation to time 5 5 5 5 5   Orientation to Place 5 5 5 5 5   Registration 3 3 3 3 3   Attention/ Calculation 5 5 0 0 0  Recall 2 3 3 3 3   Language- name 2 objects   0 0 0  Language- repeat 1 1 1 1 1   Language- follow 3 step command   3 3 3   Language- read & follow direction   0 0 0  Write a sentence   0 0 0  Copy design   0 0 0  Total score   20 20 20       11/08/2022   10:33 AM 05/07/2022    3:00 PM  Montreal Cognitive Assessment   Visuospatial/ Executive (0/5) 3 4  Naming (0/3) 3 3  Attention: Read list of digits (0/2) 1 2  Attention: Read list of letters (0/1) 1 1  Attention: Serial 7 subtraction starting at 100 (0/3) 0 1  Language: Repeat phrase (0/2) 2 2  Language : Fluency (0/1) 0 0  Abstraction (0/2) 2 2  Delayed Recall (0/5) 4 2  Orientation (0/6) 6 6  Total 22 23      12/19/2022   10:57 AM  6CIT Screen  What Year? 0 points  What month? 0 points  What time? 0 points  Count back from 20 0 points  Months in reverse 4 points  Repeat phrase 2 points  Total Score 6 points    Immunizations Immunization History  Administered Date(s) Administered   Fluad Quad(high Dose 65+) 05/07/2019, 05/24/2020, 05/19/2021, 05/15/2022   Influenza Split 06/04/2011, 05/27/2012   Influenza Whole 08/18/2008, 05/26/2010   Influenza, High Dose Seasonal PF 05/22/2017, 06/11/2018   Influenza,inj,Quad PF,6+ Mos 06/08/2013, 05/19/2014, 06/08/2015, 05/07/2016   Influenza-Unspecified 05/17/2020   PFIZER(Purple Top)SARS-COV-2 Vaccination 10/27/2019, 11/17/2019, 10/11/2020   Pfizer Covid-19 Vaccine Bivalent Booster 38yrs & up 12/21/2021   Pneumococcal Conjugate-13 03/17/2014   Pneumococcal Polysaccharide-23 11/23/2009, 06/08/2015, 07/14/2018   Td 11/09/2002   Tdap 07/03/2013   Zoster Recombinat (Shingrix) 02/19/2019, 08/27/2019   Zoster, Live 06/17/2013    TDAP  status: Up to date  Flu Vaccine status: Up to date  Pneumococcal vaccine status: Up to date  Covid-19 vaccine status: Information provided on how to obtain vaccines.   Qualifies for Shingles Vaccine? Yes   Zostavax completed Yes   Shingrix Completed?: Yes  Screening Tests Health Maintenance  Topic Date Due   COVID-19 Vaccine (5 - 2023-24 season) 05/11/2022   INFLUENZA VACCINE  04/11/2023   DTaP/Tdap/Td (3 - Td or Tdap) 07/04/2023   Medicare Annual Wellness (AWV)  12/19/2023   COLONOSCOPY (Pts 45-64yrs Insurance coverage will need to be confirmed)  01/14/2025   Pneumonia Vaccine 25+ Years old  Completed   DEXA SCAN  Completed   Hepatitis C Screening  Completed   Zoster Vaccines- Shingrix  Completed   HPV VACCINES  Aged Out    Health Maintenance  Health Maintenance Due  Topic Date Due   COVID-19 Vaccine (5 - 2023-24 season) 05/11/2022    Colorectal cancer screening: Type of screening: Colonoscopy. Completed 01/15/20. Repeat every 5 years  Mammogram status: Completed 02/06/22. Repeat every year  Bone Density status: Completed 04/06/21. Results reflect: Bone density results: OSTEOPENIA. Repeat every 2 years.  Lung Cancer Screening: (Low Dose CT Chest recommended if Age 41-80 years, 30 pack-year currently smoking OR have quit w/in 15years.) does not qualify.   Lung Cancer Screening Referral: no  Additional Screening:  Hepatitis C Screening: does qualify; Completed 06/27/16  Vision Screening: Recommended annual ophthalmology exams for early detection of glaucoma and other disorders of the eye. Is the patient up to date with their annual eye exam?  Yes  Who is the provider or what is the name of the office in which the patient attends annual eye exams? [facility] If pt is not established with a provider, would they like to be referred to a provider to establish care? No .   Dental Screening: Recommended annual dental exams for proper oral hygiene  Community Resource  Referral / Chronic Care Management: CRR required this visit?  No   CCM required this visit?  No      Plan:     I have personally reviewed and noted the following in the patient's chart:   Medical and social history Use of alcohol, tobacco or illicit drugs  Current medications and supplements including opioid prescriptions. Patient is not currently taking opioid prescriptions. Functional ability and  status Nutritional status Physical activity Advanced directives List of other physicians Hospitalizations, surgeries, and ER visits in previous 12 months Vitals Screenings to include cognitive, depression, and falls Referrals and appointments  In addition, I have reviewed and discussed with patient certain preventive protocols, quality metrics, and best practice recommendations. A written personalized care plan for preventive services as well as general preventive health recommendations were provided to patient.     Maryan Puls, LPN   11/15/6576   Nurse Notes: none

## 2022-12-20 ENCOUNTER — Other Ambulatory Visit: Payer: Medicare Other

## 2022-12-25 ENCOUNTER — Encounter: Payer: Self-pay | Admitting: Family Medicine

## 2022-12-25 ENCOUNTER — Ambulatory Visit (INDEPENDENT_AMBULATORY_CARE_PROVIDER_SITE_OTHER): Payer: Medicare PPO | Admitting: Family Medicine

## 2022-12-25 ENCOUNTER — Telehealth: Payer: Self-pay | Admitting: Family Medicine

## 2022-12-25 VITALS — BP 124/50 | HR 69 | Temp 97.5°F | Ht 62.0 in | Wt 135.0 lb

## 2022-12-25 DIAGNOSIS — J45909 Unspecified asthma, uncomplicated: Secondary | ICD-10-CM

## 2022-12-25 DIAGNOSIS — R202 Paresthesia of skin: Secondary | ICD-10-CM

## 2022-12-25 DIAGNOSIS — I10 Essential (primary) hypertension: Secondary | ICD-10-CM

## 2022-12-25 DIAGNOSIS — Z0000 Encounter for general adult medical examination without abnormal findings: Secondary | ICD-10-CM

## 2022-12-25 MED ORDER — HYDROCHLOROTHIAZIDE 12.5 MG PO TABS: ORAL_TABLET | ORAL | 2 refills | Status: DC

## 2022-12-25 MED ORDER — LOSARTAN POTASSIUM 50 MG PO TABS: 50.0000 mg | ORAL_TABLET | Freq: Every day | ORAL | 3 refills | Status: DC

## 2022-12-25 NOTE — Telephone Encounter (Signed)
Erxs sent 

## 2022-12-25 NOTE — Progress Notes (Unsigned)
Hypertension:    Using medication without problems or lightheadedness: rarely lightheaded, cautions d/w pt.   Edema: mild ankle edema.   Short of breath: h/o, with exertion.  Still on inhalers at baseline.   Not yet on amlodipine.  She had talked with cardiology about possible amlodipine use for raynaud's but BP has been controlled.   D/w pt about eval for neuropathy.  She had A1c 6.1.  d/w pt about neuro eval if she has progressive sx.  I need to check with neuro about that.    Asthma.  Per outside clinic.  Discussed getting sleep test per outside clinic.  She is going to check on home sleep test.    She had covid vaccine.  Cautions d/w pt.  Other vaccines d/w pt Colon 2021 Pap not due.  Mammogram 2023 DXA prev done.   Requesting records, see avs.    Meds, vitals, and allergies reviewed.   PMH and SH reviewed  ROS: Per HPI unless specifically indicated in ROS section   GEN: nad, alert and oriented HEENT: mucous membranes moist NECK: supple w/o LA CV: rrr. PULM: ctab, no inc wob ABD: soft, +bs EXT: no edema SKIN: well perfused.  She has some faint hypopigmented lesions on the R shin.  No ulceration.  She is going to check on getting a dermatology appointment.

## 2022-12-25 NOTE — Patient Instructions (Addendum)
I'll check with neurology about the evaluation for neuropathy in your feet.  Take care.  Glad to see you. Check with pulmonary if you don't hear about the sleep test.   Please ask the front for a record release from Dr. Lisbeth Ply office (office notes, labs, imaging).

## 2022-12-25 NOTE — Telephone Encounter (Signed)
Prescription Request  12/25/2022  LOV: 12/25/2022  What is the name of the medication or equipment? hydrochlorothiazide (HYDRODIURIL) 12.5 MG tablet   losartan (COZAAR) 50 MG tablet   Have you contacted your pharmacy to request a refill? No   Which pharmacy would you like this sent to?  [facility] - [place], [place] - [address] [place] [address] Phone: [phone] Fax: [phone]     Patient notified that their request is being sent to the clinical staff for review and that they should receive a response within 2 business days.   Please advise at Mobile [phone] (mobile)

## 2022-12-26 ENCOUNTER — Telehealth: Payer: Self-pay | Admitting: Family Medicine

## 2022-12-26 NOTE — Assessment & Plan Note (Signed)
She had covid vaccine.  Cautions d/w pt.  Other vaccines d/w pt Colon 2021 Pap not due.  Mammogram 2023 DXA prev done.

## 2022-12-26 NOTE — Assessment & Plan Note (Signed)
D/w pt about eval for neuropathy.  She had A1c 6.1.  D/w pt about neuro eval if she has progressive sx.  I will check with neuro about that.

## 2022-12-26 NOTE — Assessment & Plan Note (Signed)
Not yet on amlodipine.  She had talked with cardiology about possible amlodipine use for raynaud's but BP has been controlled.  Would continue with hydrochlorothiazide and losartan as is for now.

## 2022-12-26 NOTE — Assessment & Plan Note (Signed)
Per outside clinic.  Discussed getting sleep test per outside clinic.  She is going to check on home sleep test.

## 2022-12-26 NOTE — Telephone Encounter (Signed)
I saw this patient in clinic recently.  Can you please see about getting her in clinic for follow-up evaluation of her neuropathy symptoms?  Many thanks.

## 2023-01-03 ENCOUNTER — Encounter: Payer: Self-pay | Admitting: Internal Medicine

## 2023-01-03 DIAGNOSIS — R0683 Snoring: Secondary | ICD-10-CM | POA: Insufficient documentation

## 2023-01-03 NOTE — Assessment & Plan Note (Signed)
She is aware that she snores and complains of feeling unrested during the day. Plan-schedule home sleep test

## 2023-01-03 NOTE — Assessment & Plan Note (Signed)
Current medications seem to be controlling symptoms reasonably well this spring Plan-continue current meds

## 2023-01-03 NOTE — Assessment & Plan Note (Signed)
Well-controlled with no recent exacerbation.  Little cough or wheeze currently.

## 2023-01-15 NOTE — Progress Notes (Signed)
Office Visit Note  Patient: Samantha [name]             Date of Birth: 30-Jan-1946           MRN: [id number]             PCP: Joaquim Nam, MD Referring: Joaquim Nam, MD Visit Date: 01/29/2023 Occupation: @GUAROCC @  Subjective:  Pain in multiple joints  History of Present Illness: NYILA SALTOS is a 77 y.o. female with history of fibromyalgia and osteoarthritis.  She states since her last visit she had a mini stroke and pneumonia.  Which was treated effectively.  She has no residual problems from the TIA.  She states gets somewhat confused.  She continues to have some lower back pain which radiates into her left hip.  She continues to have some left trochanteric bursa pain. She she has off-and-on discomfort in her hands and feet but not today.  She has intermittent pain in her right knee joint.  She also has some generalized pain from fibromyalgia.  She gets intermittent rash in hair loss.  She has an appointment coming up with the dermatologist.    Activities of Daily Living:  Patient reports morning stiffness for a few minutes.   Patient Reports nocturnal pain.  Difficulty dressing/grooming: Denies Difficulty climbing stairs: Reports Difficulty getting out of chair: Reports Difficulty using hands for taps, buttons, cutlery, and/or writing: Denies  Review of Systems  Constitutional:  Positive for fatigue.  HENT:  Positive for mouth dryness. Negative for mouth sores.   Eyes:  Positive for dryness.  Respiratory:  Negative for difficulty breathing.   Cardiovascular:  Negative for chest pain and palpitations.  Gastrointestinal:  Positive for constipation. Negative for blood in stool and diarrhea.  Endocrine: Negative for increased urination.  Genitourinary:  Positive for nocturia. Negative for involuntary urination.  Musculoskeletal:  Positive for joint pain, joint pain, myalgias, morning stiffness and myalgias. Negative for gait problem, joint swelling, muscle weakness and  muscle tenderness.  Skin:  Positive for rash and hair loss. Negative for color change and sensitivity to sunlight.  Allergic/Immunologic: Positive for susceptible to infections.  Neurological:  Positive for dizziness and headaches.  Hematological:  Negative for swollen glands.  Psychiatric/Behavioral:  Positive for sleep disturbance. Negative for depressed mood. The patient is not nervous/anxious.     PMFS History:  Patient Active Problem List   Diagnosis Date Noted   Snoring 01/03/2023   Cognitive impairment 05/07/2022   Other fatigue 05/07/2022   Medicare annual wellness visit, subsequent 12/04/2021   Fatigue 01/08/2021   DNR (do not resuscitate) 07/24/2020   Tinea versicolor 05/30/2020   Myalgia 12/24/2019   Bronchiectasis without complication (HCC) 08/27/2019   Dysphagia 07/23/2019   Nonintractable headache 03/19/2019   Paresthesia 03/19/2019   Decreased grip strength 02/04/2019   Local superficial swelling 02/02/2019   Weight loss 02/02/2019   Osteopenia 08/09/2018   Chronic rhinitis 07/23/2018   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   Healthcare maintenance 07/16/2018   Chest discomfort 01/30/2018   Moderate persistent asthma with acute exacerbation 01/30/2018   Advance care planning 01/02/2018   Vitamin D deficiency 07/08/2017   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/19/2017   Primary osteoarthritis of both knees 01/19/2017   Primary osteoarthritis of both feet 01/19/2017   Deviated septum 12/07/2016   Diverticulosis 03/19/2016   Seasonal and perennial allergic rhinitis 01/18/2016   Lumbar compression fracture (HCC) 06/20/2015   Fibromyalgia 02/26/2013  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   IRRITABLE BOWEL SYNDROME 04/06/2010   THYROID NODULE, RIGHT 12/07/2009   OSTEOARTHRITIS 11/28/2009   FIBROIDS, UTERUS 05/10/2008   Insomnia 05/10/2008   ASYMPTOMATIC POSTMENOPAUSAL STATUS 05/10/2008   Essential hypertension 01/28/2008    HYPERCHOLESTEROLEMIA 01/07/2008   GLAUCOMA 01/07/2008   Gastroesophageal reflux disease 04/14/2007   Asthma 04/14/2007    Past Medical History:  Diagnosis Date   ALLERGIC RHINITIS 07/26/2008   ASTHMA 04/14/2007   ASYMPTOMATIC POSTMENOPAUSAL STATUS 05/10/2008   Bronchiectasis (HCC)    CHEST PAIN 08/26/2008   COLONIC POLYPS, HX OF 04/14/2007   colonic leiomyoma   Decreased grip strength    Episcleritis    FIBROIDS, UTERUS 05/10/2008   Fibromyalgia    Gait abnormality    GERD 04/14/2007   HYPERCHOLESTEROLEMIA 01/07/2008   HYPERGLYCEMIA 11/28/2009   HYPERTENSION 01/28/2008   INSOMNIA 05/10/2008   Irritable bowel syndrome 04/06/2010   OSTEOARTHRITIS 11/28/2009   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    Family History  Problem Relation Age of Onset   Diabetes Mother    Hypertension Mother    Glaucoma Mother    Polymyositis Mother    Heart disease Father        CHF   Allergic rhinitis Father    Asthma Father    Other Father        sideoblastic anemia   Allergic rhinitis Sister    Asthma Sister    Allergic rhinitis Brother    Asthma Brother    Prostate cancer Brother    Sleep apnea Brother    Allergic rhinitis Maternal Aunt    Asthma Maternal Aunt    Allergic rhinitis Paternal Aunt    Asthma Paternal Aunt    Breast cancer Cousin    Colon cancer Cousin    Cancer Neg Hx        No FH of Colon Cancer   Angioedema Neg Hx    Atopy Neg Hx    Immunodeficiency Neg Hx    Urticaria Neg Hx    Eczema Neg Hx    Past Surgical History:  Procedure Laterality Date   COLONOSCOPY W/ POLYPECTOMY     ELECTROCARDIOGRAM  12/04/2006   IR RADIOLOGY PERIPHERAL GUIDED IV START  04/21/2018   IR US GUIDE VASC ACCESS LEFT  04/21/2018   NASAL SEPTUM SURGERY     Stress Cardiolite  02/13/2002   Social History   Social History Narrative   Lives alone (widowed 1998).   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      2 sons and 1 daughter.        Right-handed.   No daily caffeine use.    Immunization History  Administered Date(s) Administered   Fluad Quad(high Dose 65+) 05/07/2019, 05/24/2020, 05/19/2021, 05/15/2022   Influenza Split 06/04/2011, 05/27/2012   Influenza Whole 08/18/2008, 05/26/2010   Influenza, High Dose Seasonal PF 05/22/2017, 06/11/2018   Influenza,inj,Quad PF,6+ Mos 06/08/2013, 05/19/2014, 06/08/2015, 05/07/2016   Influenza-Unspecified 05/17/2020   PFIZER(Purple Top)SARS-COV-2 Vaccination 10/27/2019, 11/17/2019, 10/11/2020   Pfizer Covid-19 Vaccine Bivalent Booster 47yrs & up 12/21/2021   Pneumococcal Conjugate-13 03/17/2014   Pneumococcal Polysaccharide-23 11/23/2009, 06/08/2015, 07/14/2018   Td 11/09/2002   Tdap 07/03/2013   Zoster Recombinat (Shingrix) 02/19/2019, 08/27/2019   Zoster, Live 06/17/2013     Objective: Vital Signs: BP (!) 150/63 (BP Location: Right Arm, Patient Position: Sitting, Cuff Size: Normal)   Pulse 62   Resp 14   Ht 5' 1.75" (1.568 m)   Wt 135 lb (61.2 kg)  BMI 24.89 kg/m    Physical Exam Vitals and nursing note reviewed.  Constitutional:      Appearance: She is well-developed.  HENT:     Head: Normocephalic and atraumatic.  Eyes:     Conjunctiva/sclera: Conjunctivae normal.  Cardiovascular:     Rate and Rhythm: Normal rate and regular rhythm.     Heart sounds: Normal heart sounds.  Pulmonary:     Effort: Pulmonary effort is normal.     Breath sounds: Normal breath sounds.  Abdominal:     General: Bowel sounds are normal.     Palpations: Abdomen is soft.  Musculoskeletal:     Cervical back: Normal range of motion.  Lymphadenopathy:     Cervical: No cervical adenopathy.  Skin:    General: Skin is warm and dry.     Capillary Refill: Capillary refill takes less than 2 seconds.  Neurological:     Mental Status: She is alert and oriented to person, place, and time.  Psychiatric:        Behavior: Behavior normal.      Musculoskeletal Exam: She had good range of motion of the cervical spine.  She had  discomfort range of motion of her lumbar spine.  Shoulders, elbows, wrist joints, MCPs PIPs and DIPs were in good range of motion with no synovitis.  She had tenderness over left trochanteric bursa.  Hip joints and knee joints were in good range of motion.  There was no tenderness over ankles or MTPs.  She had positive tender points and hyperalgesia.  CDAI Exam: CDAI Score: -- Patient Global: --; Provider Global: -- Swollen: --; Tender: -- Joint Exam 01/29/2023   No joint exam has been documented for this visit   There is currently no information documented on the homunculus. Go to the Rheumatology activity and complete the homunculus joint exam.  Investigation: No additional findings.  Imaging: No results found.  Recent Labs: Lab Results  Component Value Date   WBC 5.4 12/19/2022   HGB 13.1 12/19/2022   PLT 205.0 12/19/2022   NA 142 12/19/2022   K 4.3 12/19/2022   CL 104 12/19/2022   CO2 30 12/19/2022   GLUCOSE 93 12/19/2022   BUN 16 12/19/2022   CREATININE 0.92 12/19/2022   BILITOT 0.5 12/19/2022   ALKPHOS 63 12/19/2022   AST 20 12/19/2022   ALT 10 12/19/2022   PROT 6.9 12/19/2022   ALBUMIN 4.4 12/19/2022   CALCIUM 10.1 12/19/2022   GFRAA >60 03/10/2019    Speciality Comments: No specialty comments available.  Procedures:  No procedures performed Allergies: 2,4-d dimethylamine; Albuterol; Cefuroxime axetil; Doxycycline; Iodinated contrast media; Latex; Lisinopril; Lovastatin; and Sulfonamide derivatives   Assessment / Plan:     Visit Diagnoses: Primary osteoarthritis of both hands-she complains of pain and discomfort in her bilateral hands.  Bilateral PIP and DIP thickening was noted.  No synovitis was noted.  Joint protection was discussed.  Bilateral carpal tunnel syndrome -currently asymptomatic.  NCV with EMG on 05/06/19: moderate right carpal tunnel syndrome.  Primary osteoarthritis of both shoulders-she had good Ro of motion of bilateral shoulder joints  without discomfort.  Trochanteric bursitis, left hip-she continues to have pain and discomfort over left trochanteric bursa T band stretches were demonstrated in the office and a handout was given.  Primary osteoarthritis of both hips-she had good range of motion of bilateral hip joints without discomfort.  Primary osteoarthritis of both knees- knee joints were in good range of motion without warmth swelling or effusion.  Lower extremity muscle strengthening exercises were discussed.  Primary osteoarthritis of both feet-proper fitting shoes were advised.  Fibromyalgia -she continues to have some generalized pain and discomfort from fibromyalgia.  Need for regular exercise and stretching was discussed.  Other fatigue-related to fibromyalgia and insomnia.  Other insomnia-good sleep hygiene was discussed.  History of vitamin D deficiency-  Osteopenia of multiple sites - 07/22 DXA c/w osteopenia done by GYN.  History of IBS  History of diverticulosis  History of gastroesophageal reflux (GERD)  History of kidney stones  History of asthma  History of hypertension  History of glaucoma  History of hyperlipidemia  Orders: No orders of the defined types were placed in this encounter.  No orders of the defined types were placed in this encounter.   Follow-Up Instructions: Return in about 1 year (around 01/29/2024) for Osteoarthritis.   Pollyann Savoy, MD  Note - This record has been created using Animal nutritionist.  Chart creation errors have been sought, but may not always  have been located. Such creation errors do not reflect on  the standard of medical care.

## 2023-01-24 ENCOUNTER — Other Ambulatory Visit: Payer: Self-pay | Admitting: Family Medicine

## 2023-01-29 ENCOUNTER — Encounter: Payer: Self-pay | Admitting: Rheumatology

## 2023-01-29 ENCOUNTER — Ambulatory Visit: Payer: Medicare PPO | Attending: Rheumatology | Admitting: Rheumatology

## 2023-01-29 VITALS — BP 150/63 | HR 62 | Resp 14 | Ht 61.75 in | Wt 135.0 lb

## 2023-01-29 DIAGNOSIS — M19011 Primary osteoarthritis, right shoulder: Secondary | ICD-10-CM | POA: Diagnosis not present

## 2023-01-29 DIAGNOSIS — Z87442 Personal history of urinary calculi: Secondary | ICD-10-CM

## 2023-01-29 DIAGNOSIS — G5603 Carpal tunnel syndrome, bilateral upper limbs: Secondary | ICD-10-CM

## 2023-01-29 DIAGNOSIS — M19042 Primary osteoarthritis, left hand: Secondary | ICD-10-CM

## 2023-01-29 DIAGNOSIS — R5383 Other fatigue: Secondary | ICD-10-CM | POA: Diagnosis not present

## 2023-01-29 DIAGNOSIS — M19071 Primary osteoarthritis, right ankle and foot: Secondary | ICD-10-CM

## 2023-01-29 DIAGNOSIS — Z8709 Personal history of other diseases of the respiratory system: Secondary | ICD-10-CM

## 2023-01-29 DIAGNOSIS — Z8679 Personal history of other diseases of the circulatory system: Secondary | ICD-10-CM

## 2023-01-29 DIAGNOSIS — M797 Fibromyalgia: Secondary | ICD-10-CM

## 2023-01-29 DIAGNOSIS — G4709 Other insomnia: Secondary | ICD-10-CM

## 2023-01-29 DIAGNOSIS — M19041 Primary osteoarthritis, right hand: Secondary | ICD-10-CM

## 2023-01-29 DIAGNOSIS — M160 Bilateral primary osteoarthritis of hip: Secondary | ICD-10-CM

## 2023-01-29 DIAGNOSIS — Z8719 Personal history of other diseases of the digestive system: Secondary | ICD-10-CM

## 2023-01-29 DIAGNOSIS — Z8669 Personal history of other diseases of the nervous system and sense organs: Secondary | ICD-10-CM

## 2023-01-29 DIAGNOSIS — M7062 Trochanteric bursitis, left hip: Secondary | ICD-10-CM | POA: Diagnosis not present

## 2023-01-29 DIAGNOSIS — Z8639 Personal history of other endocrine, nutritional and metabolic disease: Secondary | ICD-10-CM

## 2023-01-29 DIAGNOSIS — M8589 Other specified disorders of bone density and structure, multiple sites: Secondary | ICD-10-CM

## 2023-01-29 DIAGNOSIS — M19012 Primary osteoarthritis, left shoulder: Secondary | ICD-10-CM

## 2023-01-29 DIAGNOSIS — M170 Bilateral primary osteoarthritis of knee: Secondary | ICD-10-CM

## 2023-01-29 DIAGNOSIS — M19072 Primary osteoarthritis, left ankle and foot: Secondary | ICD-10-CM

## 2023-01-29 NOTE — Patient Instructions (Signed)
Iliotibial Band Syndrome Rehab Ask your health care provider which exercises are safe for you. Do exercises exactly as told by your health care provider and adjust them as directed. It is normal to feel mild stretching, pulling, tightness, or discomfort as you do these exercises. Stop right away if you feel sudden pain or your pain gets significantly worse. Do not begin these exercises until told by your health care provider. Stretching and range-of-motion exercises These exercises warm up your muscles and joints and improve the movement and flexibility of your hip and pelvis. Quadriceps stretch, prone  Lie on your abdomen (prone position) on a firm surface, such as a bed or padded floor. Bend your left / right knee and reach back to hold your ankle or pant leg. If you cannot reach your ankle or pant leg, loop a belt around your foot and grab the belt instead. Gently pull your heel toward your buttocks. Your knee should not slide out to the side. You should feel a stretch in the front of your thigh and knee (quadriceps). Hold this position for __________ seconds. Repeat __________ times. Complete this exercise __________ times a day. Iliotibial band stretch An iliotibial band is a strong band of muscle tissue that runs from the outer side of your hip to the outer side of your thigh and knee. Lie on your side with your left / right leg in the top position. Bend both of your knees and grab your left / right ankle. Stretch out your bottom arm to help you balance. Slowly bring your top knee back so your thigh goes behind your trunk. Slowly lower your top leg toward the floor until you feel a gentle stretch on the outside of your left / right hip and thigh. If you do not feel a stretch and your knee will not fall farther, place the heel of your other foot on top of your knee and pull your knee down toward the floor with your foot. Hold this position for __________ seconds. Repeat __________ times.  Complete this exercise __________ times a day. Strengthening exercises These exercises build strength and endurance in your hip and pelvis. Endurance is the ability to use your muscles for a long time, even after they get tired. Straight leg raises, side-lying This exercise strengthens the muscles that rotate the leg at the hip and move it away from your body (hip abductors). Lie on your side with your left / right leg in the top position. Lie so your head, shoulder, hip, and knee line up. You may bend your bottom knee to help you balance. Roll your hips slightly forward so your hips are stacked directly over each other and your left / right knee is facing forward. Tense the muscles in your outer thigh and lift your top leg 4-6 inches (10-15 cm). Hold this position for __________ seconds. Slowly lower your leg to return to the starting position. Let your muscles relax completely before doing another repetition. Repeat __________ times. Complete this exercise __________ times a day. Leg raises, prone This exercise strengthens the muscles that move the hips backward (hip extensors). Lie on your abdomen (prone position) on your bed or a firm surface. You can put a pillow under your hips if that is more comfortable for your lower back. Bend your left / right knee so your foot is straight up in the air. Squeeze your buttocks muscles and lift your left / right thigh off the bed. Do not let your back arch. Tense   your thigh muscle as hard as you can without increasing any knee pain. Hold this position for __________ seconds. Slowly lower your leg to return to the starting position and allow it to relax completely. Repeat __________ times. Complete this exercise __________ times a day. Hip hike Stand sideways on a bottom step. Stand on your left / right leg with your other foot unsupported next to the step. You can hold on to a railing or wall for balance if needed. Keep your knees straight and your  torso square. Then lift your left / right hip up toward the ceiling. Slowly let your left / right hip lower toward the floor, past the starting position. Your foot should get closer to the floor. Do not lean or bend your knees. Repeat __________ times. Complete this exercise __________ times a day. This information is not intended to replace advice given to you by your health care provider. Make sure you discuss any questions you have with your health care provider. Document Revised: 11/04/2019 Document Reviewed: 11/04/2019 Elsevier Patient Education  2023 Elsevier Inc.  

## 2023-01-31 DIAGNOSIS — L648 Other androgenic alopecia: Secondary | ICD-10-CM | POA: Diagnosis not present

## 2023-01-31 DIAGNOSIS — R202 Paresthesia of skin: Secondary | ICD-10-CM | POA: Diagnosis not present

## 2023-02-08 ENCOUNTER — Ambulatory Visit
Admission: RE | Admit: 2023-02-08 | Discharge: 2023-02-08 | Disposition: A | Discharge status: Home / Self Care | Payer: Medicare PPO | Source: Ambulatory Visit | Attending: Family Medicine | Admitting: Family Medicine

## 2023-02-08 DIAGNOSIS — Z1231 Encounter for screening mammogram for malignant neoplasm of breast: Secondary | ICD-10-CM | POA: Diagnosis not present

## 2023-02-13 ENCOUNTER — Telehealth: Payer: Self-pay | Admitting: Internal Medicine

## 2023-02-13 NOTE — Telephone Encounter (Signed)
PT calling because she got a letter from [place] saying her budesonide-formoterol [facility]) 160-4.5 MCG/ACT inhaler [[id number]] has been recalled.  That letter states her Dr. Evaristo Bury send an "e-prescribe" if we have that system "ask them to prescribe another/different RX"  She prefers non-generic.  Please call her @ [phone]

## 2023-02-13 NOTE — Telephone Encounter (Signed)
Can you run ticket on patients insurance to see what's covered?   Please route message to triage

## 2023-02-13 NOTE — Telephone Encounter (Signed)
Patient states needs Albuterol inhaler. Pharmacy is [facility] delivery. Needs 3 month supply with 3 refills. Patient phone number is [phone].

## 2023-02-14 ENCOUNTER — Other Ambulatory Visit (HOSPITAL_COMMUNITY): Payer: Self-pay

## 2023-02-14 NOTE — Telephone Encounter (Signed)
Per test claims, alternative options in the ICS+LABA class, are as follows:   Dulera $86.88 Breo $103.18 Advair Diskus $49.83 Advair HFA $111.38  It is possible patient has a deductible.

## 2023-02-15 MED ORDER — ALBUTEROL SULFATE HFA 108 (90 BASE) MCG/ACT IN AERS: 2.0000 | INHALATION_SPRAY | Freq: Four times a day (QID) | RESPIRATORY_TRACT | 3 refills | Status: DC | PRN

## 2023-02-15 NOTE — Telephone Encounter (Signed)
Called and spoke with patient. I advised her that we are waiting on a response from Dr. Maple Hudson and since he is out of the office, Monday would be the earliest that we would hear back from him. She verbalized understanding. She declined having the brand name Symbicort sent to her local pharmacy.   While on the phone, she mentioned that she needed a refill on her albuterol to be sent to [facility]. I advised her that I would go ahead and send in the refill for her. '  Will await Dr. Roxy Cedar response.

## 2023-02-15 NOTE — Telephone Encounter (Signed)
PT has not got a reply. Can he switch her back to the Name Brand Symbicort?  Please call PT. She is out of it. Sending back high priority due to time constraints.  Her # is [phone] Please call to advise and clarify.   She still wants it to go to [facility] even tho it is mail order  You can [facility]ency[facility] [address] [facility] [place].

## 2023-02-15 NOTE — Telephone Encounter (Signed)
Dr. Maple Hudson please advise Patient states [employer] will no longer cover symbicort. Posted below from PA team is a least of alternatives

## 2023-02-16 DIAGNOSIS — Z20822 Contact with and (suspected) exposure to covid-19: Secondary | ICD-10-CM | POA: Diagnosis not present

## 2023-02-16 DIAGNOSIS — J069 Acute upper respiratory infection, unspecified: Secondary | ICD-10-CM | POA: Diagnosis not present

## 2023-02-17 MED ORDER — BUDESONIDE-FORMOTEROL FUMARATE 160-4.5 MCG/ACT IN AERO: 2.0000 | INHALATION_SPRAY | Freq: Two times a day (BID) | RESPIRATORY_TRACT | 4 refills | Status: DC

## 2023-02-17 NOTE — Telephone Encounter (Signed)
Brand name Symbicort ordered through [facility]

## 2023-02-20 ENCOUNTER — Other Ambulatory Visit: Payer: Self-pay

## 2023-02-20 MED ORDER — BUDESONIDE-FORMOTEROL FUMARATE 160-4.5 MCG/ACT IN AERO: 2.0000 | INHALATION_SPRAY | Freq: Every day | RESPIRATORY_TRACT | 12 refills | Status: DC

## 2023-02-20 NOTE — Telephone Encounter (Signed)
Spoke with patient.  She complains of productive cough with clear phlegm, fatigue, nasal congestion, dizziness, some SOB No fever  Patient was seen in urgent care Saturday. They gave her a Covid test and it was negative. They advised she had an upper respiratory infection and treated her with a zpak. She states she feels somewhat better but not 100%. Everyone in her church has been sick with Covid. She took two other home test and both were negative  Dr. Maple Hudson please advise? [facility] [place] [address]

## 2023-02-20 NOTE — Telephone Encounter (Signed)
Spoke with patient. Advised Symbicort has been sent to pharmacy   Patient also states she is dizzy, head is foggy, productive cough with clear phlegm, nasal congestion, fatigue, some SOB No fever Was seen in urgent care started they did a covid test it was negative they advised she had some kind of upper respiratory infection. She as treated with antibiotic. States she is feeling somewhat better  Dr. Maple Hudson please advise  Pharmacy [address] in [place]

## 2023-02-20 NOTE — Telephone Encounter (Signed)
Not sure why she is feeling dizzy and foggy headed. Try taking Mucinex-DM otc

## 2023-02-25 ENCOUNTER — Other Ambulatory Visit: Payer: Self-pay | Admitting: Internal Medicine

## 2023-02-27 ENCOUNTER — Encounter: Payer: Self-pay | Admitting: Nurse Practitioner

## 2023-02-27 ENCOUNTER — Telehealth: Payer: Self-pay | Admitting: Family Medicine

## 2023-02-27 ENCOUNTER — Ambulatory Visit (INDEPENDENT_AMBULATORY_CARE_PROVIDER_SITE_OTHER): Payer: Medicare PPO | Admitting: Nurse Practitioner

## 2023-02-27 ENCOUNTER — Ambulatory Visit (INDEPENDENT_AMBULATORY_CARE_PROVIDER_SITE_OTHER)
Admission: RE | Admit: 2023-02-27 | Discharge: 2023-02-27 | Disposition: A | Discharge status: Home / Self Care | Payer: Medicare PPO | Source: Ambulatory Visit | Attending: Nurse Practitioner | Admitting: Nurse Practitioner

## 2023-02-27 VITALS — BP 126/58 | HR 76 | Temp 97.9°F | Resp 16 | Ht 61.75 in | Wt 132.1 lb

## 2023-02-27 DIAGNOSIS — Z760 Encounter for issue of repeat prescription: Secondary | ICD-10-CM | POA: Insufficient documentation

## 2023-02-27 DIAGNOSIS — J4541 Moderate persistent asthma with (acute) exacerbation: Secondary | ICD-10-CM | POA: Diagnosis not present

## 2023-02-27 DIAGNOSIS — R059 Cough, unspecified: Secondary | ICD-10-CM | POA: Diagnosis not present

## 2023-02-27 DIAGNOSIS — B379 Candidiasis, unspecified: Secondary | ICD-10-CM

## 2023-02-27 DIAGNOSIS — H6501 Acute serous otitis media, right ear: Secondary | ICD-10-CM | POA: Insufficient documentation

## 2023-02-27 DIAGNOSIS — J0140 Acute pansinusitis, unspecified: Secondary | ICD-10-CM

## 2023-02-27 DIAGNOSIS — R0609 Other forms of dyspnea: Secondary | ICD-10-CM

## 2023-02-27 MED ORDER — FLUCONAZOLE 150 MG PO TABS
150.0000 mg | ORAL_TABLET | Freq: Once | ORAL | 0 refills | Status: DC
Start: 2023-02-27 — End: 2023-10-16

## 2023-02-27 MED ORDER — PREDNISONE 20 MG PO TABS
ORAL_TABLET | ORAL | 0 refills | Status: AC
Start: 2023-02-27 — End: 2023-03-05

## 2023-02-27 MED ORDER — PANTOPRAZOLE SODIUM 40 MG PO TBEC
40.0000 mg | DELAYED_RELEASE_TABLET | Freq: Two times a day (BID) | ORAL | 0 refills | Status: DC
Start: 2023-02-27 — End: 2023-03-26

## 2023-02-27 MED ORDER — AMOXICILLIN-POT CLAVULANATE 875-125 MG PO TABS
1.0000 | ORAL_TABLET | Freq: Two times a day (BID) | ORAL | 0 refills | Status: AC
Start: 2023-02-27 — End: 2023-03-06

## 2023-02-27 NOTE — Patient Instructions (Signed)
Nice to see you today I will be in touch with the chest xray once I have reviewed it I have sent in steroids and antibiotics to the pharmacy Follow up if you do not start improving

## 2023-02-27 NOTE — Assessment & Plan Note (Signed)
Will treat patient with Augmentin 875-125 mg twice daily for 7 days.  Follow-up with improvement

## 2023-02-27 NOTE — Assessment & Plan Note (Signed)
History of asthma been out of Symbicort awaiting inhaler in the mail.  Has used albuterol inhaler some with relief.  Will put patient on prednisone 20 mg taper as prescribed.  Precautions reviewed pending chest x-ray also given history of asthma bronchiolectasis

## 2023-02-27 NOTE — Assessment & Plan Note (Signed)
Right ear looks infected we will treat with Augmentin 875-125 mg twice daily for 7 days.  Follow-up if no improvement

## 2023-02-27 NOTE — Progress Notes (Signed)
Acute Office Visit  Subjective:     Patient ID: Samantha [name], female    DOB: 06-Jul-1946, 77 y.o.   MRN: [id number]  Chief Complaint  Patient presents with   Cough    Since June 8 Covid neg Went to [facility] and they told her that it was a URI   Sore Throat   Nasal Congestion   Chest Pain    tightness    Cough Associated symptoms include chest pain, chills, headaches, a sore throat and shortness of breath. Pertinent negatives include no ear pain, fever or myalgias.  Sore Throat  Associated symptoms include coughing, headaches and shortness of breath. Pertinent negatives include no abdominal pain, diarrhea, ear discharge, ear pain or vomiting.  Chest Pain  Associated symptoms include a cough, headaches, malaise/fatigue and shortness of breath. Pertinent negatives include no abdominal pain, fever, nausea or vomiting.   Patient is in today for sick symptoms with a history of HTN, raynauds, Asthma, GERD,  Symptoms started on tuesaday and then progressed form there 4th. States that 13 people from chruch had covid. States she tested and was negative. States she went to [facility] and they tested it was negative. She tested again that was negative.  Covid vaccine: pfizer Flu vaccine: utd States that she has been using tyleonol that has helped some with the headache   States that the urgent care gave her a zpak and it helped some   States that she has been out of and off the symbicort for at least a week  States that she had to use the albuterol inherl yesterdya and tis morning that has helped some   States she has a history of pneumonia   Review of Systems  Constitutional:  Positive for chills and malaise/fatigue. Negative for fever.  HENT:  Positive for sore throat. Negative for ear discharge and ear pain.   Respiratory:  Positive for cough and shortness of breath.   Cardiovascular:  Positive for chest pain.  Gastrointestinal:  Negative for abdominal pain, constipation, diarrhea, nausea and  vomiting.  Musculoskeletal:  Negative for joint pain and myalgias.  Neurological:  Positive for headaches.        Objective:    BP (!) 126/58   Pulse 76   Temp 97.9 F (36.6 C)   Resp 16   Ht 5' 1.75" (1.568 m)   Wt 132 lb 2 oz (59.9 kg)   SpO2 98%   BMI 24.36 kg/m    Physical Exam Vitals and nursing note reviewed.  Constitutional:      Appearance: Normal appearance.  HENT:     Right Ear: Ear canal and external ear normal. Tympanic membrane is erythematous and bulging.     Left Ear: Tympanic membrane, ear canal and external ear normal.     Nose:     Right Sinus: Maxillary sinus tenderness and frontal sinus tenderness present.     Left Sinus: Maxillary sinus tenderness and frontal sinus tenderness present.     Mouth/Throat:     Mouth: Mucous membranes are moist.     Pharynx: Oropharynx is clear.  Cardiovascular:     Rate and Rhythm: Normal rate and regular rhythm.     Heart sounds: Normal heart sounds.  Pulmonary:     Effort: Pulmonary effort is normal.     Comments: Decreased Lymphadenopathy:     Cervical: No cervical adenopathy.  Neurological:     Mental Status: She is alert.     No results found for any  visits on 02/27/23.      Assessment & Plan:   Problem List Items Addressed This Visit       Respiratory   Moderate persistent asthma with acute exacerbation   Relevant Medications   predniSONE (DELTASONE) 20 MG tablet   Other Relevant Orders   DG Chest 2 View   Acute non-recurrent pansinusitis - Primary    Will treat patient with Augmentin 875-125 mg twice daily for 7 days.  Follow-up with improvement      Relevant Medications   amoxicillin-clavulanate (AUGMENTIN) 875-125 MG tablet   predniSONE (DELTASONE) 20 MG tablet     Nervous and Auditory   Non-recurrent acute serous otitis media of right ear    Right ear looks infected we will treat with Augmentin 875-125 mg twice daily for 7 days.  Follow-up if no improvement      Relevant Medications    amoxicillin-clavulanate (AUGMENTIN) 875-125 MG tablet     Other   Dyspnea on exertion    History of asthma been out of Symbicort awaiting inhaler in the mail.  Has used albuterol inhaler some with relief.  Will put patient on prednisone 20 mg taper as prescribed.  Precautions reviewed pending chest x-ray also given history of asthma bronchiolectasis      Relevant Medications   predniSONE (DELTASONE) 20 MG tablet   Other Relevant Orders   DG Chest 2 View   Medication refill    Patient states she ran out of her Protonix she has ordered it via the mail but has not come in yet asked for acute prescription sent to a [facility] pharmacy I obliged.  Protonix 40 mg twice daily for 30 days       Relevant Medications   pantoprazole (PROTONIX) 40 MG tablet    Meds ordered this encounter  Medications   amoxicillin-clavulanate (AUGMENTIN) 875-125 MG tablet    Sig: Take 1 tablet by mouth 2 (two) times daily for 7 days.    Dispense:  14 tablet    Refill:  0    Order Specific Question:   Supervising Provider    Answer:   Milinda Antis, MARNE A [[id number]]   predniSONE (DELTASONE) 20 MG tablet    Sig: Take 2 tablets (40 mg total) by mouth daily with breakfast for 3 days, THEN 1 tablet (20 mg total) daily with breakfast for 3 days. Avoid NSAIDs.    Dispense:  9 tablet    Refill:  0    Order Specific Question:   Supervising Provider    Answer:   Roxy Manns A [[id number]]   pantoprazole (PROTONIX) 40 MG tablet    Sig: Take 1 tablet (40 mg total) by mouth 2 (two) times daily.    Dispense:  60 tablet    Refill:  0    Order Specific Question:   Supervising Provider    Answer:   TOWER, MARNE A [[id number]]    Return if symptoms worsen or fail to improve.  Samantha Nine, NP

## 2023-02-27 NOTE — Telephone Encounter (Signed)
Will send in diflucan as it will take care of both yeasts. She can take it when she finishes the antibiotics

## 2023-02-27 NOTE — Telephone Encounter (Signed)
Patient picked up antibiotics, but also needs a script for diflucan and magic mouthwash as the medicines prescribed give her yeast infections   [facility] scripts to   [id number] [place], [place] - [address] Phone: [phone]  Fax: [phone]

## 2023-02-27 NOTE — Assessment & Plan Note (Signed)
Patient states she ran out of her Protonix she has ordered it via the mail but has not come in yet asked for acute prescription sent to a [facility] pharmacy I obliged.  Protonix 40 mg twice daily for 30 days

## 2023-02-28 NOTE — Telephone Encounter (Signed)
Spoke with pt relaying Matt's message. Pt verbalizes understanding and expresses her thanks.

## 2023-03-18 NOTE — Telephone Encounter (Signed)
Since she wasn't better, I would likely you to see her in clinic and see about options.  Thanks.

## 2023-03-18 NOTE — Telephone Encounter (Signed)
Dr. Para March, Okay no problem. I will send to the nurses to see about scheduling patient for a visit. Thank you!    POD 3, can patient be contacted to either see myself or Dr. Terrace Arabia re: neuropathy. Thank you!

## 2023-03-18 NOTE — Telephone Encounter (Signed)
Did she report progressive symptoms of neuropathy? At last visit, she reported symptoms present for over a year.  We discussed obtaining EMG/NCV if she started having worsening symptoms, she was not interested in medications at that time.

## 2023-03-20 ENCOUNTER — Encounter: Payer: Self-pay | Admitting: Adult Health

## 2023-03-20 ENCOUNTER — Ambulatory Visit (INDEPENDENT_AMBULATORY_CARE_PROVIDER_SITE_OTHER): Payer: Medicare PPO | Admitting: Adult Health

## 2023-03-20 VITALS — BP 143/71 | HR 70 | Ht 61.0 in | Wt 132.0 lb

## 2023-03-20 DIAGNOSIS — M5416 Radiculopathy, lumbar region: Secondary | ICD-10-CM

## 2023-03-20 DIAGNOSIS — G629 Polyneuropathy, unspecified: Secondary | ICD-10-CM

## 2023-03-20 NOTE — Patient Instructions (Addendum)
Your Plan:  You will be scheduled for an EMG/NCV with Dr. Terrace Arabia to further evaluate lower extremity numbness and decreased sensation   You will be called to schedule an MRI of your lumbar back    Will determine follow up based on above testing     Thank you for coming to see [place] at [facility]. I hope we have been able to provide you high quality care today.  You may receive a patient satisfaction survey over the next few weeks. We would appreciate your feedback and comments so that we may continue to improve ourselves and the health of our patients.

## 2023-03-20 NOTE — Progress Notes (Signed)
Chief Complaint  Patient presents with   Follow-up    Patient in room #3 and alone. Patient states she has pain both knees mostly her left.    ASSESSMENT AND PLAN  Samantha [name] is a 77 y.o. female    BLE numbness/tingling Chronic back pain with lumbar radiculopathy  Present for greater than 1 year gradually progressing  B12 and TSH 04/2022 normal  A1c 6.1 10/2022  Recommend completing EMG/NCV for further evaluation  Obtain MRI lumbar spine for radiculopathy down left leg present over the past couple of years gradually worsening  Declines interest in medication use at this time     Need for follow-up will be determined after above testing completed    DIAGNOSTIC DATA (LABS, IMAGING, TESTING) - I reviewed patient records, labs, notes, testing and imaging myself where available.  MRI brain 02/26/2022 -No acute intracranial process. No evidence of acute or subacute infarct.  Laboratory evaluation 04/2022 TSH 3.140, B12 982, RPR nonreactive  Laboratory evaluation showed normal negative ESR, C-reactive protein, CBC, CMP, with mild elevated glucose 134,     MEDICAL HISTORY:   Update 03/20/2023 JM: Patient returns per request of PCP Dr. Para March to further discuss neuropathy symptoms.  Reports numbness sensation in bilateral lower extremities gradually progressive since prior visit.  Typically worse at night when laying down and can fluctuate.  Worse when wearing socks or shoes or having anything touch her toes such as a sheet.  Numbness radiates up to mid calf bilaterally.  She also notes chronic lower back pain over the past couple of years that radiates down her left leg that has also been gradually worsening.  She has not had any recent imaging.  She does have multiple joint pain and follows with rheumatology for osteoarthritis and fibromyalgia     History provided for reference purposes only Update 11/08/2022 JM: Returns for 76-month follow-up visit unaccompanied.  Reports  cognition stable since prior visit. MOCA today 22/30 (prior 23/30) with deficits in visual-spatial/executive, attention, language, and recall. She does enjoy reading, participates in bible study and games on her phone.  She is not overly active nor any routine physical activity/exercise.   Evaluated by Dr. Frances Furbish 06/2022 for concern of underlying sleep apnea. Per epic, cancelled schedule HST back in October due to illness but has not yet been rescheduled. She has been struggling with bronchitis and pneumonia over the past several months, being followed by pulmonology and PCP  She also mentions issues with numbness/tingling on the bottom her feet bilaterally and occasional cramping sensation at least over the past year.  Denies prior diagnosis of diabetes, prior A1c 5.9 (10/209).  Prior lab work including TSH and B12 (04/2022) WNL.  No known family history.  Denies any significant progression since onset.  More bothersome at night, at times can have numbness/tingling sensation up to ankle.   Consult visit 05/07/2022 Dr. Terrace Arabia: Samantha [name] is a 77 year old female seen in request by primary care doctor Crawford Givens for evaluation of word finding difficulties, confusion, I was able to talk with her daughter Judeth Cornfield on the phone,  I reviewed and summarized the referring note. PMHx. HTN Asthma HLD Right carpal tunnel syndromes   I saw her previously for right hand paresthesia, frequent headaches, since 2020,  EMG nerve conduction study in August 2020, confirmed the diagnosis of moderate right carpal tunnel syndromes  She complains almost daily headaches, CT head without contrast showed no significant abnormalities, mild small vessel disease  Multiple repeat  ESR, C-reactive protein was within normal limit, she manage her headache by sleep, Tylenol as needed  She also carries a diagnosis of fibromyalgia I have suggested Cymbalta in the past, worry about the side effect, she did not try  She  retired from [employer], lives alone, complains of excessive daytime fatigue, sleepiness, poor sleep quality, already falling to sleep, difficulty staying asleep, does snore sometimes  She has slow worsening memory loss over the past few years, got lost while driving on the familiar route, misplace things, word finding difficulties, her mother did suffer memory loss in the 180s  She presented to the emergency room on February 25, 2022, after not function well for 2 days, word finding difficulties, lightheadedness, off-balance,  Personally reviewed MRI of the brain on February 26, 2022, no acute abnormality, age-appropriate changes  PHYSICAL EXAM:   Vitals:   03/20/23 1412  BP: (!) 143/71  Pulse: 70  Weight: 132 lb (59.9 kg)  Height: 5\' 1"  (1.549 m)     Not recorded     Body mass index is 24.94 kg/m.  PHYSICAL EXAMNIATION:  Gen: NAD, conversant, very pleasant elderly African-American female, well nourised, well groomed                     Cardiovascular: Regular rate rhythm, no peripheral edema, warm, nontender. Eyes: Conjunctivae clear without exudates or hemorrhage Neck: Supple, no carotid bruits. Pulmonary: Clear to auscultation bilaterally   NEUROLOGICAL EXAM:  MENTAL STATUS: Speech/cognition: Awake, alert, oriented to history taking and casual conversation     11/08/2022   10:33 AM 05/07/2022    3:00 PM  Montreal Cognitive Assessment   Visuospatial/ Executive (0/5) 3 4  Naming (0/3) 3 3  Attention: Read list of digits (0/2) 1 2  Attention: Read list of letters (0/1) 1 1  Attention: Serial 7 subtraction starting at 100 (0/3) 0 1  Language: Repeat phrase (0/2) 2 2  Language : Fluency (0/1) 0 0  Abstraction (0/2) 2 2  Delayed Recall (0/5) 4 2  Orientation (0/6) 6 6  Total 22 23    CRANIAL NERVES: CN II: Visual fields are full to confrontation. Pupils are round equal and briskly reactive to light. CN III, IV, VI: extraocular movement are normal. No ptosis. CN V: Facial  sensation is intact to light touch CN VII: Face is symmetric with normal eye closure  CN VIII: Hearing is normal to causal conversation. CN IX, X: Phonation is normal. CN XI: Head turning and shoulder shrug are intact CN XII: Narrow oropharyngeal space  MOTOR: There is no pronator drift of out-stretched arms. Muscle bulk and tone are normal. Muscle strength is normal.  No evidence of dorsiflexion or plantarflexion weakness  SENSORY: Decreased pinprick, vibratory and temperature sensation in distal BLE up to ankle.  Position sensation intact.  COORDINATION: There is no trunk or limb dysmetria noted.  GAIT/STANCE: Stands from seated position with mild difficulty.  Wide-based gait.  Slight favoring of left leg due to left knee pain      REVIEW OF SYSTEMS:  Full 14 system review of systems performed and notable only for as above All other review of systems were negative.   ALLERGIES: Allergies  Allergen Reactions   2,4-D Dimethylamine    Albuterol     Tachycardia   Cefuroxime Axetil     REACTION: pt not sure of reaction- she can tolerate amoxil   Doxycycline Itching   Iodinated Contrast Media Other (See Comments) and Itching  Latex Itching   Lisinopril     Cough   Lovastatin Other (See Comments)    Muscle aches    Sulfonamide Derivatives     REACTION: pt not sure of reaction    HOME MEDICATIONS: Current Outpatient Medications  Medication Sig Dispense Refill   albuterol (VENTOLIN HFA) 108 (90 Base) MCG/ACT inhaler Inhale 2 puffs into the lungs every 6 (six) hours as needed for wheezing or shortness of breath. 54 g 3   Azelastine HCl 0.15 % SOLN 2 puffs each nostril twice daily as needed 90 mL 3   bifidobacterium infantis (ALIGN) capsule Take 1 capsule by mouth daily.     budesonide-formoterol (SYMBICORT) 160-4.5 MCG/ACT inhaler Inhale 2 puffs into the lungs 2 (two) times daily. Rinse mouth  (FILL  WITH THESE DIRECTIONS.  THE PATIENT CAN USE THE GENERIC SYMBICORT.) 3  each 4   cetirizine (ZYRTEC) 10 MG tablet Take 10 mg by mouth at bedtime.      Cholecalciferol (VITAMIN D) 50 MCG (2000 UT) CAPS Take 1 capsule by mouth daily.     Coenzyme Q10 (CO Q 10) 60 MG CAPS 1 capsule with a meal     cromolyn (NASALCROM) 5.2 MG/ACT nasal spray Place 1 spray into both nostrils 2 (two) times daily as needed for allergies. 78 mL 3   hydrochlorothiazide (HYDRODIURIL) 12.5 MG tablet TAKE 1/2 TO 1 TABLET(6.25 TO 12.5 MG) BY MOUTH DAILY 90 tablet 2   levalbuterol (XOPENEX HFA) 45 MCG/ACT inhaler Inhale 1-2 puffs into the lungs every 6 (six) hours as needed for wheezing. 1 each 12   levalbuterol (XOPENEX) 1.25 MG/3ML nebulizer solution Take 1.25 mg by nebulization every 6 (six) hours as needed for wheezing. 72 mL 12   losartan (COZAAR) 50 MG tablet Take 1 tablet (50 mg total) by mouth daily. 90 tablet 3   Magnesium Malate POWD (1250mg  tabs) 1 tablet by mouth qhs     Manganese 50 MG TABS Take 50 mg by mouth at bedtime.      montelukast (SINGULAIR) 10 MG tablet Take 1 tablet (10 mg total) by mouth at bedtime. 90 tablet 3   Olopatadine HCl (PATADAY) 0.2 % SOLN Place 1 drop into both eyes daily as needed (itchy eyes).     pantoprazole (PROTONIX) 40 MG tablet Take 1 tablet (40 mg total) by mouth 2 (two) times daily. 60 tablet 0   Polyethyl Glycol-Propyl Glycol (SYSTANE) 0.4-0.3 % GEL ophthalmic gel Place 1 application into both eyes every 6 (six) hours as needed (dry eyes).     TART CHERRY PO Take by mouth. Take 1 tablet one time a day by mouth     thiamine (VITAMIN B-1) 50 MG tablet Take 100 mg by mouth daily.     TURMERIC PO Take 1 tablet by mouth daily.     budesonide-formoterol (SYMBICORT) 160-4.5 MCG/ACT inhaler Inhale 2 puffs into the lungs daily. (Patient not taking: Reported on 02/27/2023) 1 each 12   No current facility-administered medications for this visit.    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    PAST SURGICAL HISTORY: Past Surgical History:  Procedure Laterality Date   COLONOSCOPY W/ POLYPECTOMY     ELECTROCARDIOGRAM  12/04/2006   IR RADIOLOGY PERIPHERAL GUIDED IV START  04/21/2018   IR US GUIDE VASC ACCESS LEFT  04/21/2018   NASAL SEPTUM SURGERY     Stress Cardiolite  02/13/2002    FAMILY HISTORY: Family History  Problem Relation Age of Onset   Diabetes Mother    Hypertension Mother    Glaucoma Mother    Polymyositis Mother    Heart disease Father        CHF   Allergic rhinitis Father    Asthma Father    Other Father        sideoblastic anemia   Allergic rhinitis Sister    Asthma Sister    Allergic rhinitis Maternal Aunt    Asthma Maternal Aunt    Allergic rhinitis Paternal Aunt    Asthma Paternal Aunt    Colon cancer Cousin    Allergic rhinitis Brother    Asthma Brother    Prostate cancer Brother    Sleep apnea Brother    Cancer Neg Hx        No FH of Colon Cancer   Angioedema Neg Hx    Atopy Neg Hx    Immunodeficiency Neg Hx    Urticaria Neg Hx    Eczema Neg Hx     SOCIAL HISTORY: Social History   Socioeconomic History   Marital status: Widowed    Spouse name: Not on file   Number of children: 3   Years of education: some college   Highest education level: Not on file  Occupational History    Employer: RETIRED    Comment: Retired Journalist, newspaper)  Tobacco Use   Smoking status: Never    Passive exposure: Never   Smokeless tobacco: Never   Vaping Use   Vaping Use: Never used  Substance and Sexual Activity   Alcohol use: No   Drug use: No   Sexual activity: Never  Other Topics Concern   Not on file  Social History Narrative   Lives alone (widowed 1998).   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      2 sons and 1 daughter.        Right-handed.   No daily caffeine use.   Social Determinants of Health   Financial Resource Strain: Low Risk  (12/19/2022)   Overall Financial Resource Strain (CARDIA)    Difficulty of Paying Living Expenses: Not hard at all  Food Insecurity: No Food Insecurity (12/19/2022)   Hunger Vital Sign    Worried About Running Out of Food in the Last Year: Never true    Ran Out of Food in the Last Year: Never true  Transportation Needs: No Transportation Needs (12/19/2022)   PRAPARE - Administrator, Civil Service (Medical): No    Lack of Transportation (Non-Medical): No  Physical Activity: Inactive (12/19/2022)   Exercise Vital Sign    Days of Exercise per Week: 0 days    Minutes of Exercise per Session: 0 min  Stress: No Stress Concern Present (12/19/2022)   [employer] of Occupational Health - Occupational Stress Questionnaire    Feeling of Stress : Not at all  Social Connections: Moderately Isolated (12/19/2022)   Social Connection and Isolation Panel [NHANES]  Frequency of Communication with Friends and Family: More than three times a week    Frequency of Social Gatherings with Friends and Family: Twice a week    Attends Religious Services: More than 4 times per year    Active Member of [employer] or Organizations: No    Attends Banker Meetings: Never    Marital Status: Widowed  Intimate Partner Violence: Not At Risk (12/19/2022)   Humiliation, Afraid, Rape, and Kick questionnaire    Fear of Current or Ex-Partner: No    Emotionally Abused: No    Physically Abused: No    Sexually Abused: No      I spent 31 minutes of face-to-face and non-face-to-face  time with patient.  This included previsit chart review, lab review, study review, order entry, electronic health record documentation, patient education and discussion regarding above diagnoses and treatment plan and answered all other questions to patient's satisfaction  Ihor Austin, [facility]  [facility] [address] [place], [place] [address]  Phone [phone] Fax [phone] Note: This document was prepared with digital dictation and possible smart phrase technology. Any transcriptional errors that result from this process are unintentional.

## 2023-03-21 ENCOUNTER — Other Ambulatory Visit: Payer: Self-pay | Admitting: Nurse Practitioner

## 2023-03-21 ENCOUNTER — Telehealth: Payer: Self-pay | Admitting: Adult Health

## 2023-03-21 DIAGNOSIS — Z760 Encounter for issue of repeat prescription: Secondary | ICD-10-CM

## 2023-03-21 NOTE — Telephone Encounter (Signed)
[employer] auth: [id number] exp. 03/21/2023 - 05/18/2023, [facility] sent to [facility] [phone]

## 2023-03-23 NOTE — Telephone Encounter (Signed)
Was she able to get this via mail order?  Does she need it sent via mail order or local?

## 2023-04-02 ENCOUNTER — Other Ambulatory Visit: Payer: Medicare PPO

## 2023-04-02 DIAGNOSIS — L650 Telogen effluvium: Secondary | ICD-10-CM | POA: Diagnosis not present

## 2023-04-02 DIAGNOSIS — L853 Xerosis cutis: Secondary | ICD-10-CM | POA: Diagnosis not present

## 2023-04-02 DIAGNOSIS — L648 Other androgenic alopecia: Secondary | ICD-10-CM | POA: Diagnosis not present

## 2023-04-03 ENCOUNTER — Telehealth: Payer: Self-pay | Admitting: Internal Medicine

## 2023-04-03 DIAGNOSIS — L650 Telogen effluvium: Secondary | ICD-10-CM | POA: Diagnosis not present

## 2023-04-03 NOTE — Telephone Encounter (Signed)
PT calling congested, sore throat. Wanted an appt but none avail. Pls call  [phone]

## 2023-04-03 NOTE — Telephone Encounter (Signed)
Spoke with patient regarding prior message.Patient stated since we didn't have any openings today she was going to see her PCP.Patient stated she did tell the lady that had called her back after they got disconnected .  Patient's voice was understanding.Nothing else further needed.

## 2023-04-04 ENCOUNTER — Encounter: Payer: Self-pay | Admitting: Internal Medicine

## 2023-04-04 ENCOUNTER — Ambulatory Visit (INDEPENDENT_AMBULATORY_CARE_PROVIDER_SITE_OTHER): Payer: Medicare PPO | Admitting: Internal Medicine

## 2023-04-04 VITALS — BP 130/70 | HR 70 | Temp 97.7°F | Ht 61.0 in | Wt 134.0 lb

## 2023-04-04 DIAGNOSIS — J0140 Acute pansinusitis, unspecified: Secondary | ICD-10-CM

## 2023-04-04 MED ORDER — AMOXICILLIN-POT CLAVULANATE 875-125 MG PO TABS
1.0000 | ORAL_TABLET | Freq: Two times a day (BID) | ORAL | 0 refills | Status: DC
Start: 2023-04-04 — End: 2023-04-11

## 2023-04-04 NOTE — Progress Notes (Signed)
Subjective:    Patient ID: Samantha [name], female    DOB: 23-Feb-1946, 77 y.o.   MRN: [id number]  HPI Here due to respiratory infection  Started 5-6 days ago--irritated throat and laryngitis Just won't clear up Did have sinus and ear infection in June---got augmentin and improved after a few weeks  Right ear pain--but not today Some cough Gets some chest tightness--just rests. ALbuterol MDI will help Still on bid symbicort No fever--usually stays low No chills but some sweats Slight SOB No headache  Did test twice for COVID recently--with June outbreak in church--negative Didn't test this time  Using azelastine Flushes nose  Current Outpatient Medications on File Prior to Visit  Medication Sig Dispense Refill   albuterol (VENTOLIN HFA) 108 (90 Base) MCG/ACT inhaler Inhale 2 puffs into the lungs every 6 (six) hours as needed for wheezing or shortness of breath. 54 g 3   Azelastine HCl 0.15 % SOLN 2 puffs each nostril twice daily as needed 90 mL 3   bifidobacterium infantis (ALIGN) capsule Take 1 capsule by mouth daily.     budesonide-formoterol (SYMBICORT) 160-4.5 MCG/ACT inhaler Inhale 2 puffs into the lungs 2 (two) times daily. Rinse mouth  (FILL  WITH THESE DIRECTIONS.  THE PATIENT CAN USE THE GENERIC SYMBICORT.) 3 each 4   cetirizine (ZYRTEC) 10 MG tablet Take 10 mg by mouth at bedtime.      Cholecalciferol (VITAMIN D) 50 MCG (2000 UT) CAPS Take 1 capsule by mouth daily.     Coenzyme Q10 (CO Q 10) 60 MG CAPS 1 capsule with a meal     cromolyn (NASALCROM) 5.2 MG/ACT nasal spray Place 1 spray into both nostrils 2 (two) times daily as needed for allergies. 78 mL 3   hydrochlorothiazide (HYDRODIURIL) 12.5 MG tablet TAKE 1/2 TO 1 TABLET(6.25 TO 12.5 MG) BY MOUTH DAILY 90 tablet 2   levalbuterol (XOPENEX HFA) 45 MCG/ACT inhaler Inhale 1-2 puffs into the lungs every 6 (six) hours as needed for wheezing. 1 each 12   levalbuterol (XOPENEX) 1.25 MG/3ML nebulizer solution Take 1.25 mg  by nebulization every 6 (six) hours as needed for wheezing. 72 mL 12   losartan (COZAAR) 50 MG tablet Take 1 tablet (50 mg total) by mouth daily. 90 tablet 3   Magnesium Malate POWD (1250mg  tabs) 1 tablet by mouth qhs     Manganese 50 MG TABS Take 50 mg by mouth at bedtime.      montelukast (SINGULAIR) 10 MG tablet Take 1 tablet (10 mg total) by mouth at bedtime. 90 tablet 3   Olopatadine HCl (PATADAY) 0.2 % SOLN Place 1 drop into both eyes daily as needed (itchy eyes).     pantoprazole (PROTONIX) 40 MG tablet Take 1 tablet (40 mg total) by mouth 2 (two) times daily. DX K21.9 and K58.9 180 tablet 1   Polyethyl Glycol-Propyl Glycol (SYSTANE) 0.4-0.3 % GEL ophthalmic gel Place 1 application into both eyes every 6 (six) hours as needed (dry eyes).     TART CHERRY PO Take by mouth. Take 1 tablet one time a day by mouth     thiamine (VITAMIN B-1) 50 MG tablet Take 100 mg by mouth daily.     TURMERIC PO Take 1 tablet by mouth daily.     No current facility-administered medications on file prior to visit.    Allergies  Allergen Reactions   2,4-D Dimethylamine    Albuterol     Tachycardia   Cefuroxime Axetil  REACTION: pt not sure of reaction- she can tolerate amoxil   Doxycycline Itching   Iodinated Contrast Media Other (See Comments) and Itching   Latex Itching   Lisinopril     Cough   Lovastatin Other (See Comments)    Muscle aches    Sulfonamide Derivatives     REACTION: pt not sure of reaction    Past Medical History:  Diagnosis Date   ALLERGIC RHINITIS 07/26/2008   ASTHMA 04/14/2007   ASYMPTOMATIC POSTMENOPAUSAL STATUS 05/10/2008   Bronchiectasis (HCC)    CHEST PAIN 08/26/2008   COLONIC POLYPS, HX OF 04/14/2007   colonic leiomyoma   Decreased grip strength    Episcleritis    FIBROIDS, UTERUS 05/10/2008   Fibromyalgia    Gait abnormality    GERD 04/14/2007   HYPERCHOLESTEROLEMIA 01/07/2008   HYPERGLYCEMIA 11/28/2009   HYPERTENSION 01/28/2008   INSOMNIA 05/10/2008    Irritable bowel syndrome 04/06/2010   OSTEOARTHRITIS 11/28/2009   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    Past Surgical History:  Procedure Laterality Date   COLONOSCOPY W/ POLYPECTOMY     ELECTROCARDIOGRAM  12/04/2006   IR RADIOLOGY PERIPHERAL GUIDED IV START  04/21/2018   IR US GUIDE VASC ACCESS LEFT  04/21/2018   NASAL SEPTUM SURGERY     Stress Cardiolite  02/13/2002    Family History  Problem Relation Age of Onset   Diabetes Mother    Hypertension Mother    Glaucoma Mother    Polymyositis Mother    Heart disease Father        CHF   Allergic rhinitis Father    Asthma Father    Other Father        sideoblastic anemia   Allergic rhinitis Sister    Asthma Sister    Allergic rhinitis Maternal Aunt    Asthma Maternal Aunt    Allergic rhinitis Paternal Aunt    Asthma Paternal Aunt    Colon cancer Cousin    Allergic rhinitis Brother    Asthma Brother    Prostate cancer Brother    Sleep apnea Brother    Cancer Neg Hx        No FH of Colon Cancer   Angioedema Neg Hx    Atopy Neg Hx    Immunodeficiency Neg Hx    Urticaria Neg Hx    Eczema Neg Hx     Social History   Socioeconomic History   Marital status: Widowed    Spouse name: Not on file   Number of children: 3   Years of education: some college   Highest education level: Not on file  Occupational History    Employer: RETIRED    Comment: Retired Journalist, newspaper)  Tobacco Use   Smoking status: Never    Passive exposure: Never   Smokeless tobacco: Never  Vaping Use   Vaping status: Never Used  Substance and Sexual Activity   Alcohol use: No   Drug use: No   Sexual activity: Never  Other Topics Concern   Not on file  Social History Narrative   Lives alone (widowed 1998).   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      2 sons and 1 daughter.        Right-handed.   No daily caffeine use.   Social Determinants of Health   Financial Resource Strain: Low Risk  (12/19/2022)   Overall Financial Resource  Strain (CARDIA)    Difficulty of Paying Living Expenses: Not hard at all  Food  Insecurity: No Food Insecurity (12/19/2022)   Hunger Vital Sign    Worried About Running Out of Food in the Last Year: Never true    Ran Out of Food in the Last Year: Never true  Transportation Needs: No Transportation Needs (12/19/2022)   PRAPARE - Administrator, Civil Service (Medical): No    Lack of Transportation (Non-Medical): No  Physical Activity: Inactive (12/19/2022)   Exercise Vital Sign    Days of Exercise per Week: 0 days    Minutes of Exercise per Session: 0 min  Stress: No Stress Concern Present (12/19/2022)   [employer] of Occupational Health - Occupational Stress Questionnaire    Feeling of Stress : Not at all  Social Connections: Moderately Isolated (12/19/2022)   Social Connection and Isolation Panel [NHANES]    Frequency of Communication with Friends and Family: More than three times a week    Frequency of Social Gatherings with Friends and Family: Twice a week    Attends Religious Services: More than 4 times per year    Active Member of [employer] or Organizations: No    Attends Banker Meetings: Never    Marital Status: Widowed  Intimate Partner Violence: Not At Risk (12/19/2022)   Humiliation, Afraid, Rape, and Kick questionnaire    Fear of Current or Ex-Partner: No    Emotionally Abused: No    Physically Abused: No    Sexually Abused: No   Review of Systems No loss of smell or tasate Eating okay No N/V--but stomach queasy briefly No rash      Objective:   Physical Exam Constitutional:      Appearance: Normal appearance.  HENT:     Head:     Comments: Maxillary and right temporal tenderness    Mouth/Throat:     Comments: Slight pharyngeal injection Pulmonary:     Effort: Pulmonary effort is normal.     Breath sounds: Normal breath sounds. No wheezing or rales.  Musculoskeletal:     Cervical back: Neck supple.  Lymphadenopathy:     Cervical:  No cervical adenopathy.  Neurological:     Mental Status: She is alert.            Assessment & Plan:

## 2023-04-04 NOTE — Assessment & Plan Note (Signed)
Has had recurrences frequently No asthma exacerbation--is on the symbicort Will refill her augmentin 875 bid for a week--she can hold off if she is feeling better in the next couple of days

## 2023-04-05 ENCOUNTER — Other Ambulatory Visit: Payer: Medicare PPO

## 2023-04-11 ENCOUNTER — Encounter: Payer: Self-pay | Admitting: Internal Medicine

## 2023-04-11 ENCOUNTER — Ambulatory Visit: Payer: Medicare PPO | Attending: Internal Medicine | Admitting: Internal Medicine

## 2023-04-11 VITALS — BP 136/58 | HR 62 | Ht 61.0 in | Wt 132.0 lb

## 2023-04-11 DIAGNOSIS — R079 Chest pain, unspecified: Secondary | ICD-10-CM

## 2023-04-11 DIAGNOSIS — M797 Fibromyalgia: Secondary | ICD-10-CM | POA: Diagnosis not present

## 2023-04-11 DIAGNOSIS — E7800 Pure hypercholesterolemia, unspecified: Secondary | ICD-10-CM | POA: Diagnosis not present

## 2023-04-11 DIAGNOSIS — I7300 Raynaud's syndrome without gangrene: Secondary | ICD-10-CM | POA: Diagnosis not present

## 2023-04-11 DIAGNOSIS — I10 Essential (primary) hypertension: Secondary | ICD-10-CM | POA: Diagnosis not present

## 2023-04-11 MED ORDER — DILTIAZEM HCL 30 MG PO TABS: 30.0000 mg | ORAL_TABLET | Freq: Three times a day (TID) | ORAL | 11 refills | Status: DC | PRN

## 2023-04-11 MED ORDER — DILTIAZEM HCL 30 MG PO TABS: ORAL_TABLET | ORAL | 11 refills | Status: DC

## 2023-04-11 NOTE — Progress Notes (Signed)
Cardiology Office Note:    Date:  04/11/2023   ID:  Samantha [name], DOB 1946/02/09, MRN [id number]  PCP:  Joaquim Nam, MD   [facility] Providers Cardiologist:  None {  Referring MD: Joaquim Nam, MD   CC: Transition to new cardiologist  History of Present Illness:    Samantha [name] is a 77 y.o. female with a hx of HTN, HLD asthma, GERD and fibromyalgia who was previously followed by Dr. Rennis Golden who presents to clinic for follow-up. 2019: Had coronary CTA 04/2018 which showed no evidence of CAD. Ca score 0.  2023: doing well with Dr. Shari Prows.  Patient notes that she is doing well.   Since last visit notes that is recovering from a sinus infection and just finished antibiotics.  Feeling much better. . There are no interval hospital/ED visit.   EKG showed NSR.  Rare CP.  No SOB/DOE and no PND/Orthopnea.  No weight gain.  Notes rare left leg swelling (none today).  No palpitations or syncope.  Came off amlodipine for symptomatic hypotension.   Does not exercise.  Wants to bo back to lifting weights and do resistance band.  Ambulatory blood pressure has been variable.    Past Medical History:  Diagnosis Date   ALLERGIC RHINITIS 07/26/2008   ASTHMA 04/14/2007   ASYMPTOMATIC POSTMENOPAUSAL STATUS 05/10/2008   Bronchiectasis (HCC)    CHEST PAIN 08/26/2008   COLONIC POLYPS, HX OF 04/14/2007   colonic leiomyoma   Decreased grip strength    Episcleritis    FIBROIDS, UTERUS 05/10/2008   Fibromyalgia    Gait abnormality    GERD 04/14/2007   HYPERCHOLESTEROLEMIA 01/07/2008   HYPERGLYCEMIA 11/28/2009   HYPERTENSION 01/28/2008   INSOMNIA 05/10/2008   Irritable bowel syndrome 04/06/2010   OSTEOARTHRITIS 11/28/2009   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    Past Surgical History:  Procedure Laterality Date   COLONOSCOPY W/ POLYPECTOMY     ELECTROCARDIOGRAM  12/04/2006   IR RADIOLOGY PERIPHERAL GUIDED IV START  04/21/2018   IR US GUIDE VASC ACCESS LEFT  04/21/2018    NASAL SEPTUM SURGERY     Stress Cardiolite  02/13/2002    Current Medications: Current Meds  Medication Sig   albuterol (VENTOLIN HFA) 108 (90 Base) MCG/ACT inhaler Inhale 2 puffs into the lungs every 6 (six) hours as needed for wheezing or shortness of breath.   Azelastine HCl 0.15 % SOLN 2 puffs each nostril twice daily as needed   betamethasone, augmented, (DIPROLENE) 0.05 % lotion as needed (itching).   bifidobacterium infantis (ALIGN) capsule Take 1 capsule by mouth daily.   budesonide-formoterol (SYMBICORT) 160-4.5 MCG/ACT inhaler Inhale 2 puffs into the lungs 2 (two) times daily. Rinse mouth  (FILL  WITH THESE DIRECTIONS.  THE PATIENT CAN USE THE GENERIC SYMBICORT.)   cetirizine (ZYRTEC) 10 MG tablet Take 10 mg by mouth at bedtime.    Cholecalciferol (VITAMIN D) 50 MCG (2000 UT) CAPS Take 1 capsule by mouth daily.   Coenzyme Q10 (CO Q 10) 60 MG CAPS 1 capsule with a meal   cromolyn (NASALCROM) 5.2 MG/ACT nasal spray Place 1 spray into both nostrils 2 (two) times daily as needed for allergies.   hydrochlorothiazide (HYDRODIURIL) 12.5 MG tablet TAKE 1/2 TO 1 TABLET(6.25 TO 12.5 MG) BY MOUTH DAILY   levalbuterol (XOPENEX HFA) 45 MCG/ACT inhaler Inhale 1-2 puffs into the lungs every 6 (six) hours as needed for wheezing.   levalbuterol (XOPENEX) 1.25 MG/3ML nebulizer solution Take 1.25 mg by nebulization every  6 (six) hours as needed for wheezing.   losartan (COZAAR) 50 MG tablet Take 1 tablet (50 mg total) by mouth daily.   Magnesium Malate POWD (1250mg  tabs) 1 tablet by mouth qhs   Manganese 50 MG TABS Take 50 mg by mouth at bedtime.    mometasone (ELOCON) 0.1 % lotion as needed (otc irritation).   montelukast (SINGULAIR) 10 MG tablet Take 1 tablet (10 mg total) by mouth at bedtime.   Olopatadine HCl (PATADAY) 0.2 % SOLN Place 1 drop into both eyes daily as needed (itchy eyes).   pantoprazole (PROTONIX) 40 MG tablet Take 1 tablet (40 mg total) by mouth 2 (two) times daily. DX K21.9 and  K58.9   Polyethyl Glycol-Propyl Glycol (SYSTANE) 0.4-0.3 % GEL ophthalmic gel Place 1 application into both eyes every 6 (six) hours as needed (dry eyes).   TART CHERRY PO Take by mouth. Take 1 tablet one time a day by mouth   thiamine (VITAMIN B-1) 50 MG tablet Take 100 mg by mouth daily.   TURMERIC PO Take 1 tablet by mouth daily.     Allergies:   2,4-d dimethylamine; Albuterol; Cefuroxime axetil; Doxycycline; Iodinated contrast media; Latex; Lisinopril; Lovastatin; and Sulfonamide derivatives   Social History   Socioeconomic History   Marital status: Widowed    Spouse name: Not on file   Number of children: 3   Years of education: some college   Highest education level: Not on file  Occupational History    Employer: RETIRED    Comment: Retired Journalist, newspaper)  Tobacco Use   Smoking status: Never    Passive exposure: Never   Smokeless tobacco: Never  Vaping Use   Vaping status: Never Used  Substance and Sexual Activity   Alcohol use: No   Drug use: No   Sexual activity: Never  Other Topics Concern   Not on file  Social History Narrative   Lives alone (widowed 1998).   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      2 sons and 1 daughter.        Right-handed.   No daily caffeine use.   Social Determinants of Health   Financial Resource Strain: Low Risk  (12/19/2022)   Overall Financial Resource Strain (CARDIA)    Difficulty of Paying Living Expenses: Not hard at all  Food Insecurity: No Food Insecurity (12/19/2022)   Hunger Vital Sign    Worried About Running Out of Food in the Last Year: Never true    Ran Out of Food in the Last Year: Never true  Transportation Needs: No Transportation Needs (12/19/2022)   PRAPARE - Administrator, Civil Service (Medical): No    Lack of Transportation (Non-Medical): No  Physical Activity: Inactive (12/19/2022)   Exercise Vital Sign    Days of Exercise per Week: 0 days    Minutes of Exercise per Session: 0 min   Stress: No Stress Concern Present (12/19/2022)   [employer] of Occupational Health - Occupational Stress Questionnaire    Feeling of Stress : Not at all  Social Connections: Moderately Isolated (12/19/2022)   Social Connection and Isolation Panel [NHANES]    Frequency of Communication with Friends and Family: More than three times a week    Frequency of Social Gatherings with Friends and Family: Twice a week    Attends Religious Services: More than 4 times per year    Active Member of [employer] or Organizations: No    Attends Club or  Organization Meetings: Never    Marital Status: Widowed     Family History: The patient's family history includes Allergic rhinitis in her brother, father, maternal aunt, paternal aunt, and sister; Asthma in her brother, father, maternal aunt, paternal aunt, and sister; Colon cancer in her cousin; Diabetes in her mother; Glaucoma in her mother; Heart disease in her father; Hypertension in her mother; Other in her father; Polymyositis in her mother; Prostate cancer in her brother; Sleep apnea in her brother. There is no history of Cancer, Angioedema, Atopy, Immunodeficiency, Urticaria, or Eczema.  ROS:   Please see the history of present illness.     EKGs/Labs/Other Studies Reviewed:    The following studies were reviewed today:  Cardiac Studies & Procedures     STRESS TESTS  EXERCISE TOLERANCE TEST (ETT) 02/12/2018  Narrative  Blood pressure demonstrated a hypertensive response to exercise.  1. Poor exercise tolerance with hypertensive BP response. 2. The patient had chest pain during test. 3. There was nonspecific upsloping ST depression in inferior leads and leads V4-V6.  This study is not diagnostic of ischemia.  However, would consider stress imaging or coronary CTA.   ECHOCARDIOGRAM  ECHOCARDIOGRAM COMPLETE 04/19/2022  Narrative ECHOCARDIOGRAM REPORT    Patient Name:   RENODA LOURY Date of Exam: 04/19/2022 Medical Rec #:  [id number]       Height:       62.0 in Accession #:    [id number]     Weight:       128.0 lb Date of Birth:  [birth year]      BSA:          1.581 m Patient Age:    75 years       BP:           140/74 mmHg Patient Gender: F              HR:           60 bpm. Exam Location:    Procedure: 2D Echo, Cardiac Doppler, Color Doppler and Strain Analysis  Indications:    R07.9* Chest pain, unspecified  History:        Patient has no prior history of Echocardiogram examinations. Signs/Symptoms:Chest Pain, Dizziness/Lightheadedness and Fatigue; Risk Factors:Hypertension, Dyslipidemia and Non-Smoker.  Sonographer:    Quentin Ore RDMS, RVT, RDCS Referring Phys: [id number] Meriam Sprague   Sonographer Comments: Suboptimal DHM tracking IMPRESSIONS   1. Left ventricular ejection fraction, by estimation, is 65 to 70%. The left ventricle has normal function. The left ventricle has no regional wall motion abnormalities. Left ventricular diastolic parameters were normal. The average left ventricular global longitudinal strain is -18.9 %. The global longitudinal strain is normal. 2. Right ventricular systolic function is normal. The right ventricular size is normal. 3. The mitral valve is normal in structure. No evidence of mitral valve regurgitation. 4. The aortic valve is tricuspid. Aortic valve regurgitation is not visualized. 5. The inferior vena cava is normal in size with greater than 50% respiratory variability, suggesting right atrial pressure of 3 mmHg.  FINDINGS Left Ventricle: Left ventricular ejection fraction, by estimation, is 65 to 70%. The left ventricle has normal function. The left ventricle has no regional wall motion abnormalities. The average left ventricular global longitudinal strain is -18.9 %. The global longitudinal strain is normal. The left ventricular internal cavity size was normal in size. There is no left ventricular hypertrophy. Left ventricular diastolic parameters were  normal.  Right Ventricle: The right ventricular size is  normal. No increase in right ventricular wall thickness. Right ventricular systolic function is normal.  Left Atrium: Left atrial size was normal in size.  Right Atrium: Right atrial size was normal in size.  Pericardium: There is no evidence of pericardial effusion.  Mitral Valve: The mitral valve is normal in structure. No evidence of mitral valve regurgitation.  Tricuspid Valve: The tricuspid valve is normal in structure. Tricuspid valve regurgitation is not demonstrated.  Aortic Valve: The aortic valve is tricuspid. Aortic valve regurgitation is not visualized. Aortic valve mean gradient measures 4.0 mmHg. Aortic valve peak gradient measures 7.7 mmHg. Aortic valve area, by VTI measures 2.19 cm.  Pulmonic Valve: The pulmonic valve was not well visualized. Pulmonic valve regurgitation is not visualized.  Aorta: The aortic root and ascending aorta are structurally normal, with no evidence of dilitation.  Venous: The inferior vena cava is normal in size with greater than 50% respiratory variability, suggesting right atrial pressure of 3 mmHg.  IAS/Shunts: No atrial level shunt detected by color flow Doppler.   LEFT VENTRICLE PLAX 2D LVIDd:         4.40 cm     Diastology LVIDs:         2.20 cm     LV e' medial:    7.29 cm/s LV PW:         0.90 cm     LV E/e' medial:  9.9 LV IVS:        1.10 cm     LV e' lateral:   6.42 cm/s LVOT diam:     1.80 cm     LV E/e' lateral: 11.2 LV SV:         73 LV SV Index:   46          2D Longitudinal Strain LVOT Area:     2.54 cm    2D Strain GLS Avg:     -18.9 %  LV Volumes (MOD) LV vol d, MOD A2C: 57.1 ml LV vol d, MOD A4C: 69.2 ml LV vol s, MOD A2C: 27.9 ml LV vol s, MOD A4C: 29.9 ml LV SV MOD A2C:     29.2 ml LV SV MOD A4C:     69.2 ml LV SV MOD BP:      34.1 ml  RIGHT VENTRICLE             IVC RV Basal diam:  2.40 cm     IVC diam: 1.00 cm RV S prime:     10.20 cm/s RVOT diam:       2.70 cm TAPSE (M-mode): 2.7 cm  LEFT ATRIUM             Index        RIGHT ATRIUM           Index LA diam:        3.60 cm 2.28 cm/m   RA Area:     11.50 cm LA Vol (A2C):   46.3 ml 29.28 ml/m  RA Volume:   23.50 ml  14.86 ml/m LA Vol (A4C):   37.8 ml 23.90 ml/m LA Biplane Vol: 43.2 ml 27.32 ml/m AORTIC VALVE                    PULMONIC VALVE AV Area (Vmax):    2.27 cm     PV Vmax:       1.05 m/s AV Area (Vmean):   2.26 cm     PV Peak grad:  4.4 mmHg AV Area (VTI):     2.19 cm AV Vmax:           139.00 cm/s AV Vmean:          91.400 cm/s AV VTI:            0.333 m AV Peak Grad:      7.7 mmHg AV Mean Grad:      4.0 mmHg LVOT Vmax:         124.00 cm/s LVOT Vmean:        81.300 cm/s LVOT VTI:          0.286 m LVOT/AV VTI ratio: 0.86  AORTA Ao Root diam: 2.70 cm Ao Asc diam:  2.60 cm Ao Arch diam: 2.5 cm  MITRAL VALVE MV Area (PHT): 2.08 cm    SHUNTS MV Decel Time: 365 msec    Systemic VTI:  0.29 m MV E velocity: 72.00 cm/s  Systemic Diam: 1.80 cm MV A velocity: 73.70 cm/s  Pulmonic Diam: 2.70 cm MV E/A ratio:  0.98  Debbe Odea MD Electronically signed by Debbe Odea MD Signature Date/Time: 04/19/2022/3:36:33 PM    Final    MONITORS  LONG TERM MONITOR (3-14 DAYS) 04/05/2022  Narrative   Patch wear time was 3 days   Predominant rhythm was NSR with average HR 62bpm   Rare ectopy (<1% SVE, <1% VE)   Patient triggered events correlate with NSR/sinus bradycardia   No sustained arrhythmias or significant pauses   Patch Wear Time:  3 days and 0 hours (2023-07-19T09:54:18-0400 to 2023-07-22T09:57:21-0400)  Patient had a min HR of 46 bpm, max HR of 126 bpm, and avg HR of 62 bpm. Predominant underlying rhythm was Sinus Rhythm. Isolated SVEs were rare (<1.0%), SVE Couplets were rare (<1.0%), and no SVE Triplets were present. No Isolated VEs, VE Couplets, or VE Triplets were present.  Laurance Flatten, MD   CT SCANS  CT CORONARY MORPH W/CTA COR  W/SCORE 04/21/2018  Addendum 04/21/2018 12:39 PM ADDENDUM REPORT: 04/21/2018 12:36  EXAM: OVER-READ INTERPRETATION  CT CHEST  The following report is an over-read performed by radiologist Dr. Deberah Pelton [facility] on 04/21/2018. This over-read does not include interpretation of cardiac or coronary anatomy or pathology. The cardiac/coronary CT interpretation by the cardiologist is attached.  COMPARISON:  None.  FINDINGS: The visualized portions of the lower lung fields show no suspicious nodules, masses, or infiltrates. The visualized portions of the mediastinum and chest wall are unremarkable.  IMPRESSION: No significant non-cardiac abnormality in visualized portion of the thorax.   Electronically Signed By: Myles Rosenthal M.D. On: 04/21/2018 12:36  Narrative CLINICAL DATA:  77 year old female with atypical chest pain.  EXAM: Cardiac/Coronary  CT  TECHNIQUE: The patient was scanned on a [employer].  FINDINGS: A 120 kV prospective scan was triggered in the descending thoracic aorta at 111 HU's. Axial non-contrast 3 mm slices were carried out through the heart. The data set was analyzed on a dedicated work station and scored using the Agatson method. Gantry rotation speed was 250 msecs and collimation was .6 mm. No beta blockade and 0.8 mg of sl NTG was given. The 3D data set was reconstructed in 5% intervals of the 67-82 % of the R-R cycle. Diastolic phases were analyzed on a dedicated work station using MPR, MIP and VRT modes. The patient received 80 cc of contrast.  Aorta:  Normal size.  No calcifications.  No dissection.  Aortic Valve:  Trileaflet.  No  calcifications.  Coronary Arteries:  Normal coronary origin. Left dominance.  Left main is a large and long artery that gives rise to LAD and LCX arteries. Left main has no plaque.  LAD is a large vessel that gives rise to one diagonal artery, wraps around the apex and has no  plaque.  LCX is a large dominant artery that gives rise to two OM branches, PDA and PLA. There is no plaque.  Other findings:  Normal pulmonary vein drainage into the left atrium.  Normal let atrial appendage without a thrombus.  Normal size of the pulmonary artery.  IMPRESSION: 1. Coronary calcium score of 0. This was 0 percentile for age and sex matched control.  2. Normal coronary origin with left dominance.  3. No evidence of CAD.  Consider non-cardiac sources of chest pain.  Electronically Signed: By: Tobias Alexander On: 04/21/2018 11:48            Recent Labs: 12/19/2022: ALT 10; BUN 16; Creatinine, Ser 0.92; Hemoglobin 13.1; Platelets 205.0; Potassium 4.3; Sodium 142; TSH 4.37  Recent Lipid Panel    Component Value Date/Time   CHOL 229 (H) 12/19/2022 0959   TRIG 84.0 12/19/2022 0959   HDL 103.30 12/19/2022 0959   CHOLHDL 2 12/19/2022 0959   VLDL 16.8 12/19/2022 0959   LDLCALC 109 (H) 12/19/2022 0959   Physical Exam:    VS:  BP (!) 136/58   Pulse 62   Ht 5\' 1"  (1.549 m)   Wt 132 lb (59.9 kg)   SpO2 97%   BMI 24.94 kg/m     Wt Readings from Last 3 Encounters:  04/11/23 132 lb (59.9 kg)  04/04/23 134 lb (60.8 kg)  03/20/23 132 lb (59.9 kg)     GEN:  Well nourished, well developed in no acute distress HEENT: Normal NECK: No JVD CARDIAC: RRR, 1/6 systolic murmur. No rubs or gallops. RESPIRATORY:  Clear to auscultation without rales, wheezing or rhonchi  ABDOMEN: Soft, non-tender, non-distended MUSCULOSKELETAL:  No edema; No deformity  SKIN: Warm and dry NEUROLOGIC:  Alert and oriented x 3 PSYCHIATRIC:  Normal affect   ASSESSMENT:    1. Chest pain of uncertain etiology   2. Essential hypertension   3. Fibromyalgia   4. HYPERCHOLESTEROLEMIA   5. Raynaud's disease without gangrene     PLAN:    #Atypical Chest pain with prior Raynaud's Phenomenon # Fibromyalgia -  will offer diltiazem 30 mg PO PRN for vasospasm, if no improvement this is  like fibromyalgia  #HLD #Statin Myalgia: - zero calcium score, worse from prior - Silver Sneakers re-start  #HTN: - doing well on current medications     One year  Medication Adjustments/Labs and Tests Ordered: Current medicines are reviewed at length with the patient today.  Concerns regarding medicines are outlined above.  Orders Placed This Encounter  Procedures   EKG 12-Lead   No orders of the defined types were placed in this encounter.   There are no Patient Instructions on file for this visit.

## 2023-04-11 NOTE — Patient Instructions (Signed)
Medication Instructions:  Your physician has recommended you make the following change in your medication:  START: diltiazem (Cardizem) 30 mg by mouth three times daily as needed for chest discomfort  *If you need a refill on your cardiac medications before your next appointment, please call your pharmacy*   Lab Work: NONE  If you have labs (blood work) drawn today and your tests are completely normal, you will receive your results only by: MyChart Message (if you have MyChart) OR A paper copy in the mail If you have any lab test that is abnormal or we need to change your treatment, we will call you to review the results.   Testing/Procedures: NONE   Follow-Up: At [facility], you and your health needs are our priority.  As part of our continuing mission to provide you with exceptional heart care, we have created designated Provider Care Teams.  These Care Teams include your primary Cardiologist (physician) and Advanced Practice Providers (APPs -  Physician Assistants and Nurse Practitioners) who all work together to provide you with the care you need, when you need it.    Your next appointment:   1 year(s)  Provider:   Christell Constant, MD

## 2023-04-16 ENCOUNTER — Telehealth: Payer: Self-pay | Admitting: Internal Medicine

## 2023-04-16 NOTE — Telephone Encounter (Signed)
Pt c/o medication issue:  1. Name of Medication: diltiazem (CARDIZEM) 30 MG tablet   hydrochlorothiazide (HYDRODIURIL) 12.5 MG tablet   losartan (COZAAR) 50 MG tablet   2. How are you currently taking this medication (dosage and times per day)?   3. Are you having a reaction (difficulty breathing--STAT)? no  4. What is your medication issue? Patient calling to see if she is suppose to take all 3 meds. Please advise .

## 2023-04-16 NOTE — Telephone Encounter (Signed)
Called pt in regards to concern. Pt wants to know how should take diltiazem.   Advised may take Q8h as needed for funny heart beats.   Pt then expresses remembers being told to take medication this way at [facility].  Advised pt to continue to take hydrochlorothiazide and losartan as ordered.  Pt had no further questions or concerns.

## 2023-04-23 ENCOUNTER — Encounter: Payer: Self-pay | Admitting: Adult Health

## 2023-04-27 ENCOUNTER — Other Ambulatory Visit: Payer: Medicare PPO

## 2023-04-30 DIAGNOSIS — Z6824 Body mass index (BMI) 24.0-24.9, adult: Secondary | ICD-10-CM | POA: Diagnosis not present

## 2023-04-30 DIAGNOSIS — Z01419 Encounter for gynecological examination (general) (routine) without abnormal findings: Secondary | ICD-10-CM | POA: Diagnosis not present

## 2023-05-03 ENCOUNTER — Ambulatory Visit: Payer: Medicare Other | Admitting: Cardiology

## 2023-05-03 ENCOUNTER — Ambulatory Visit
Admission: RE | Admit: 2023-05-03 | Discharge: 2023-05-03 | Disposition: A | Discharge status: Home / Self Care | Payer: Medicare PPO | Source: Ambulatory Visit | Attending: Adult Health | Admitting: Adult Health

## 2023-05-03 DIAGNOSIS — M5416 Radiculopathy, lumbar region: Secondary | ICD-10-CM | POA: Diagnosis not present

## 2023-05-03 DIAGNOSIS — G629 Polyneuropathy, unspecified: Secondary | ICD-10-CM

## 2023-05-06 DIAGNOSIS — R109 Unspecified abdominal pain: Secondary | ICD-10-CM | POA: Diagnosis not present

## 2023-05-06 DIAGNOSIS — Z86010 Personal history of colonic polyps: Secondary | ICD-10-CM | POA: Diagnosis not present

## 2023-05-06 DIAGNOSIS — K219 Gastro-esophageal reflux disease without esophagitis: Secondary | ICD-10-CM | POA: Diagnosis not present

## 2023-05-06 DIAGNOSIS — K5900 Constipation, unspecified: Secondary | ICD-10-CM | POA: Diagnosis not present

## 2023-05-09 ENCOUNTER — Ambulatory Visit: Payer: Medicare PPO

## 2023-05-09 DIAGNOSIS — G4733 Obstructive sleep apnea (adult) (pediatric): Secondary | ICD-10-CM

## 2023-05-09 DIAGNOSIS — R0683 Snoring: Secondary | ICD-10-CM

## 2023-05-10 DIAGNOSIS — G4733 Obstructive sleep apnea (adult) (pediatric): Secondary | ICD-10-CM | POA: Diagnosis not present

## 2023-05-20 DIAGNOSIS — H40003 Preglaucoma, unspecified, bilateral: Secondary | ICD-10-CM | POA: Diagnosis not present

## 2023-05-27 DIAGNOSIS — Z0100 Encounter for examination of eyes and vision without abnormal findings: Secondary | ICD-10-CM | POA: Diagnosis not present

## 2023-05-27 DIAGNOSIS — H43813 Vitreous degeneration, bilateral: Secondary | ICD-10-CM | POA: Diagnosis not present

## 2023-05-27 DIAGNOSIS — H40003 Preglaucoma, unspecified, bilateral: Secondary | ICD-10-CM | POA: Diagnosis not present

## 2023-05-27 DIAGNOSIS — H2513 Age-related nuclear cataract, bilateral: Secondary | ICD-10-CM | POA: Diagnosis not present

## 2023-05-29 NOTE — Progress Notes (Signed)
Patient ID: Samantha [name], female    DOB: 08-28-1946, 77 y.o.   MRN: [id number]  HPI female never smoker followed for allergic rhinitis, asthma, bronchiectasis,  complicated by GERD/ LPR, Raynaud's, HBP, IBS, Glaucoma , Aortic Atherosclerosis,  Barium swallow 01/09/2016-normal PFT 06/05/2019- Mild restriction, normal flows and Diffusion Lab 04/10/21- EOS wnl, IgE 15 wnl HST 05/09/23- AHI 11.4/hr, desat to 89%, body weight 132 lbs --------------------------------------------------------------------------------------  11/27/22- 77 year old female never smoker followed for Allergic Rhinitis, Asthma, Bronchiectasis, complicated by GERD/ LPR, Raynaud's, HBP, IBS, Glaucoma -Albuterol hfa, azelastine nasal, Nasalcrom, Qnasl, Symbicort 160, singulair, Zyrtec, Protonix, -Carafate, Covid vax-4 Phizer Flu vax- Lab 04/10/21- EOS wnl, IgE 15 wnl Notices sternal pain, nonexertional and affected by movement.  Has seen cardiologist.  May be 1 pain and etiologies have not been sorted yet. She admits she is tired during the day with some difficulty sleeping at times at night.  She sleeps alone.  Aware that she snores..  She relates that a long time ago there had been discussion of doing a sleep study by one of her other physicians but it was never done.  After discussion today, she is interested in getting a home sleep test. She is enthusiastic about the benefits of drinking cucumber water to soothe her throat. CXR 10/04/22-  IMPRESSION: Chronic interstitial prominence. No convincing pneumonia since radiograph last month.  05/31/23- 77 year old female never smoker followed for Allergic Rhinitis, Asthma, Bronchiectasis, complicated by GERD/ LPR, Raynaud's, HBP, IBS, Glaucoma -Albuterol hfa, azelastine nasal, Nasalcrom, Qnasl, Symbicort 160, singulair, Zyrtec, Protonix, -Carafate, HST 05/09/23- AHI 11.4/hr, desat to 89%, body weight 132 lbs  For treatment decision We reviewed results of her sleep study and  discussed management of mild OSA.  She has a dental bridge and probably is not a candidate for an oral appliance so we will try CPAP.  Target complaints are her frequent waking at night and daytime tiredness. She is quite pleased with how well she feels.  Her chest is very clear and she is not coughing. CXR 02/27/23- No active cardiopulmonary disease.  Review of Systems-see HPI   + = positive Constitutional:   No weight loss, night sweats,  Fevers, chills, fatigue, lassitude. HEENT:   No headaches,  Difficulty swallowing,  Tooth/dental problems,  Sore throat,                No sneezing, itching, or ear ache,   nasal congestion, post nasal drip, CV:  + chest pain, orthopnea, PND, swelling in lower extremities, anasarca, dizziness, palpitations GI  No heartburn, indigestion, abdominal pain, nausea, vomiting,  Resp: No shortness of breath with exertion or at rest.  No excess mucus, +productive cough,  +-non-productive cough,  No coughing up of blood.  No change in color of mucus.  +infrequent wheezing.   Skin: Clear GU:  MS:  + joint pain or swelling.   Psych:  No change in mood or affect. No depression or anxiety.  No memory loss.   Objective:   Physical Exam General- Alert, Oriented, Affect-appropriate, Distress- none acute; pleasant  Skin- clear Lymphadenopathy- none Head- atraumatic            Eyes- Gross vision intact, PERRLA, conjunctivae clear secretions,            Ears- Normal hearing            Nose- clear, no-Septal dev, mucus, polyps, erosion, perforation .  Throat- Malampatti III.   TMs not  retracted , mucosa not red, drainage- none, tonsils- atrophic;  +dental bridge, Neck- flexible , trachea midline, no stridor , thyroid nl, carotid no bruit Chest - symmetrical excursion , unlabored           Heart/CV- RRR , no murmur , no gallop  , no rub, nl s1 s2                           - JVD- none , edema-none, stasis changes- none, varices- none           Lung-   +crackles, cough- , dullness-none, rub- none, crackles- none           Chest wall-  Abd-  Br/ Gen/ Rectal- Not done, not indicated Extrem- cyanosis- none, clubbing, none, atrophy- none, strength- nl.  Neuro- grossly intact to observation

## 2023-05-30 ENCOUNTER — Ambulatory Visit: Payer: Medicare PPO | Admitting: Internal Medicine

## 2023-05-31 ENCOUNTER — Encounter: Payer: Self-pay | Admitting: Internal Medicine

## 2023-05-31 ENCOUNTER — Ambulatory Visit (INDEPENDENT_AMBULATORY_CARE_PROVIDER_SITE_OTHER): Payer: Medicare PPO | Admitting: Internal Medicine

## 2023-05-31 VITALS — BP 122/58 | HR 70 | Temp 97.4°F | Ht 61.5 in | Wt 130.0 lb

## 2023-05-31 DIAGNOSIS — G4733 Obstructive sleep apnea (adult) (pediatric): Secondary | ICD-10-CM

## 2023-05-31 DIAGNOSIS — J479 Bronchiectasis, uncomplicated: Secondary | ICD-10-CM | POA: Diagnosis not present

## 2023-05-31 DIAGNOSIS — R0683 Snoring: Secondary | ICD-10-CM

## 2023-05-31 DIAGNOSIS — J310 Chronic rhinitis: Secondary | ICD-10-CM

## 2023-05-31 MED ORDER — MONTELUKAST SODIUM 10 MG PO TABS: 10.0000 mg | ORAL_TABLET | Freq: Every day | ORAL | 0 refills | Status: DC

## 2023-05-31 MED ORDER — CROMOLYN SODIUM 5.2 MG/ACT NA AERS: 1.0000 | INHALATION_SPRAY | Freq: Two times a day (BID) | NASAL | 3 refills | Status: DC | PRN

## 2023-05-31 MED ORDER — MONTELUKAST SODIUM 10 MG PO TABS: 10.0000 mg | ORAL_TABLET | Freq: Every day | ORAL | 3 refills | Status: DC

## 2023-05-31 MED ORDER — CETIRIZINE HCL 10 MG PO TABS: 10.0000 mg | ORAL_TABLET | Freq: Every day | ORAL | 4 refills | Status: DC

## 2023-05-31 MED ORDER — BUDESONIDE-FORMOTEROL FUMARATE 160-4.5 MCG/ACT IN AERO: INHALATION_SPRAY | RESPIRATORY_TRACT | 4 refills | Status: DC

## 2023-05-31 NOTE — Assessment & Plan Note (Signed)
We reviewed use of her antihistamines and nasal sprays, sending refills.

## 2023-05-31 NOTE — Patient Instructions (Signed)
Meds refilled  Order- new DME, new CPAP auto 5-15, mask of choice (probably nasal pillows), humidifier, supplies, AirView/ card

## 2023-05-31 NOTE — Assessment & Plan Note (Signed)
Currently doing very well with no recent exacerbation.  Chest feels clear. Plan-medications refilled

## 2023-05-31 NOTE — Assessment & Plan Note (Signed)
Starting CPAP auto 5-15 with discussion.

## 2023-06-05 ENCOUNTER — Ambulatory Visit: Payer: Medicare Other | Admitting: Cardiology

## 2023-06-05 ENCOUNTER — Ambulatory Visit (INDEPENDENT_AMBULATORY_CARE_PROVIDER_SITE_OTHER): Payer: Self-pay | Admitting: Neurology

## 2023-06-05 ENCOUNTER — Ambulatory Visit (INDEPENDENT_AMBULATORY_CARE_PROVIDER_SITE_OTHER): Payer: Medicare PPO | Admitting: Neurology

## 2023-06-05 VITALS — BP 157/66 | HR 67 | Ht 62.0 in | Wt 135.0 lb

## 2023-06-05 DIAGNOSIS — G629 Polyneuropathy, unspecified: Secondary | ICD-10-CM | POA: Diagnosis not present

## 2023-06-05 DIAGNOSIS — R7982 Elevated C-reactive protein (CRP): Secondary | ICD-10-CM | POA: Diagnosis not present

## 2023-06-05 DIAGNOSIS — M5416 Radiculopathy, lumbar region: Secondary | ICD-10-CM

## 2023-06-05 DIAGNOSIS — R52 Pain, unspecified: Secondary | ICD-10-CM | POA: Diagnosis not present

## 2023-06-05 DIAGNOSIS — R799 Abnormal finding of blood chemistry, unspecified: Secondary | ICD-10-CM | POA: Diagnosis not present

## 2023-06-05 DIAGNOSIS — Z0289 Encounter for other administrative examinations: Secondary | ICD-10-CM

## 2023-06-05 DIAGNOSIS — R748 Abnormal levels of other serum enzymes: Secondary | ICD-10-CM | POA: Diagnosis not present

## 2023-06-05 DIAGNOSIS — R700 Elevated erythrocyte sedimentation rate: Secondary | ICD-10-CM | POA: Diagnosis not present

## 2023-06-05 MED ORDER — DULOXETINE HCL 30 MG PO CPEP
30.0000 mg | ORAL_CAPSULE | Freq: Every day | ORAL | 0 refills | Status: DC
Start: 2023-06-05 — End: 2023-09-20

## 2023-06-05 MED ORDER — DULOXETINE HCL 60 MG PO CPEP
60.0000 mg | ORAL_CAPSULE | Freq: Every day | ORAL | 5 refills | Status: DC
Start: 2023-06-05 — End: 2023-09-20

## 2023-06-05 NOTE — Progress Notes (Signed)
Chief Complaint  Patient presents with   Procedure    Rm EMG/NCV 3.    ASSESSMENT AND PLAN  Samantha [name] is a 77 y.o. female   Low back pain, Diffuse body achy pain  She has significant tenderness of  bilateral SI joints,  MRI of lumbar showed no significant canal foraminal narrowing  EMG nerve conduction study in September 2024 is normal  Repeat ESR C-reactive protein to rule out inflammatory process  Cymbalta 30 mg titrating to 60 mg daily Continue follow-up with her primary care physician  DIAGNOSTIC DATA (LABS, IMAGING, TESTING) - I reviewed patient records, labs, notes, testing and imaging myself where available.  MRI brain 02/26/2022 -No acute intracranial process. No evidence of acute or subacute infarct.  Laboratory evaluation 04/2022 TSH 3.140, B12 982, RPR nonreactive  Laboratory evaluation showed normal negative ESR, C-reactive protein, CBC, CMP, with mild elevated glucose 134,  MEDICAL HISTORY:  Samantha [name] is a 77 year old female seen in request by primary care doctor Crawford Givens for evaluation of word finding difficulties, confusion, I was able to talk with her daughter Judeth Cornfield on the phone,  I reviewed and summarized the referring note. PMHx. HTN Asthma HLD Right carpal tunnel syndromes   I saw her previously for right hand paresthesia, frequent headaches, since 2020,  EMG nerve conduction study in August 2020, confirmed the diagnosis of moderate right carpal tunnel syndromes  She complains almost daily headaches, CT head without contrast showed no significant abnormalities, mild small vessel disease  Multiple repeat ESR, C-reactive protein was within normal limit, she manage her headache by sleep, Tylenol as needed  She also carries a diagnosis of fibromyalgia I have suggested Cymbalta in the past, worry about the side effect, she did not try  She retired from [employer], lives alone, complains of excessive daytime fatigue, sleepiness, poor  sleep quality, already falling to sleep, difficulty staying asleep, does snore sometimes  She has slow worsening memory loss over the past few years, got lost while driving on the familiar route, misplace things, word finding difficulties, her mother did suffer memory loss in the 180s  She presented to the emergency room on February 25, 2022, after not function well for 2 days, word finding difficulties, lightheadedness, off-balance,  Personally reviewed MRI of the brain on February 26, 2022, no acute abnormality, age-appropriate changes  Update June 05, 2023  She return for electrodiagnostic study today, which was essentially normal, no evidence of large fiber peripheral neuropathy, intrinsic muscle disease  She complains of deep body achy pain with palpitation,  MRI lumbar in August 2024 showed mild degenerative changes but no evidence of canal foraminal narrowing  Lab in 2024 normal TSH, vitamin D, CBC CMP,  PHYSICAL EXAM:   Vitals:   06/05/23 1330  BP: (!) 157/66  Pulse: 67  Weight: 135 lb (61.2 kg)  Height: 5\' 2"  (1.575 m)   Body mass index is 24.69 kg/m.  PHYSICAL EXAMNIATION:  Gen: NAD, conversant, very pleasant elderly African-American female, well nourised, well groomed                     Cardiovascular: Regular rate rhythm, no peripheral edema, warm, nontender. Eyes: Conjunctivae clear without exudates or hemorrhage Neck: Supple, no carotid bruits. Pulmonary: Clear to auscultation bilaterally   NEUROLOGICAL EXAM:  MENTAL STATUS: Speech/cognition: Awake, alert, oriented to history taking and casual conversation     11/08/2022   10:33 AM 05/07/2022    3:00 PM  [place]  Cognitive Assessment   Visuospatial/ Executive (0/5) 3 4  Naming (0/3) 3 3  Attention: Read list of digits (0/2) 1 2  Attention: Read list of letters (0/1) 1 1  Attention: Serial 7 subtraction starting at 100 (0/3) 0 1  Language: Repeat phrase (0/2) 2 2  Language : Fluency (0/1) 0 0   Abstraction (0/2) 2 2  Delayed Recall (0/5) 4 2  Orientation (0/6) 6 6  Total 22 23    CRANIAL NERVES: CN II: Visual fields are full to confrontation. Pupils are round equal and briskly reactive to light. CN III, IV, VI: extraocular movement are normal. No ptosis. CN V: Facial sensation is intact to light touch CN VII: Face is symmetric with normal eye closure  CN VIII: Hearing is normal to causal conversation. CN IX, X: Phonation is normal. CN XI: Head turning and shoulder shrug are intact CN XII: Narrow oropharyngeal space  MOTOR: Normal strength, no rigidity, bradykinesia, but complains of muscle achy pain with deep palpitation  SENSORY: Preserved to pinprick, light touch  COORDINATION: There is no trunk or limb dysmetria noted.  GAIT/STANCE: From seated position, cautious     REVIEW OF SYSTEMS:  Full 14 system review of systems performed and notable only for as above All other review of systems were negative.   ALLERGIES: Allergies  Allergen Reactions   2,4-D Dimethylamine    Albuterol     Tachycardia   Cefuroxime Axetil     REACTION: pt not sure of reaction- she can tolerate amoxil   Doxycycline Itching   Iodinated Contrast Media Other (See Comments) and Itching   Latex Itching   Lisinopril     Cough   Lovastatin Other (See Comments)    Muscle aches    Sulfonamide Derivatives     REACTION: pt not sure of reaction    HOME MEDICATIONS: Current Outpatient Medications  Medication Sig Dispense Refill   DULoxetine (CYMBALTA) 30 MG capsule Take 1 capsule (30 mg total) by mouth daily. 30 capsule 0   DULoxetine (CYMBALTA) 60 MG capsule Take 1 capsule (60 mg total) by mouth daily. 30 capsule 5   Azelastine HCl 0.15 % SOLN 2 puffs each nostril twice daily as needed 90 mL 3   betamethasone, augmented, (DIPROLENE) 0.05 % lotion as needed (itching).     bifidobacterium infantis (ALIGN) capsule Take 1 capsule by mouth daily.     budesonide-formoterol (SYMBICORT)  160-4.5 MCG/ACT inhaler Inhale 2 puffs then rinse mouth, twice daily 3 each 4   cetirizine (ZYRTEC) 10 MG tablet Take 1 tablet (10 mg total) by mouth at bedtime. 90 tablet 4   Cholecalciferol (VITAMIN D) 50 MCG (2000 UT) CAPS Take 1 capsule by mouth daily.     Coenzyme Q10 (CO Q 10) 60 MG CAPS 1 capsule with a meal     cromolyn (NASALCROM) 5.2 MG/ACT nasal spray Place 1 spray into both nostrils 2 (two) times daily as needed for allergies. 78 mL 3   diltiazem (CARDIZEM) 30 MG tablet May take every 8 hours as needed for chest discomfort 30 tablet 11   hydrochlorothiazide (HYDRODIURIL) 12.5 MG tablet TAKE 1/2 TO 1 TABLET(6.25 TO 12.5 MG) BY MOUTH DAILY 90 tablet 2   levalbuterol (XOPENEX HFA) 45 MCG/ACT inhaler Inhale 1-2 puffs into the lungs every 6 (six) hours as needed for wheezing. 1 each 12   levalbuterol (XOPENEX) 1.25 MG/3ML nebulizer solution Take 1.25 mg by nebulization every 6 (six) hours as needed for wheezing. 72 mL 12  losartan (COZAAR) 50 MG tablet Take 1 tablet (50 mg total) by mouth daily. 90 tablet 3   Magnesium Malate POWD (1250mg  tabs) 1 tablet by mouth qhs     Manganese 50 MG TABS Take 50 mg by mouth at bedtime.      mometasone (ELOCON) 0.1 % lotion as needed (otc irritation).     montelukast (SINGULAIR) 10 MG tablet Take 1 tablet (10 mg total) by mouth at bedtime. 30 tablet 0   Olopatadine HCl (PATADAY) 0.2 % SOLN Place 1 drop into both eyes daily as needed (itchy eyes).     pantoprazole (PROTONIX) 40 MG tablet Take 1 tablet (40 mg total) by mouth 2 (two) times daily. DX K21.9 and K58.9 180 tablet 1   Polyethyl Glycol-Propyl Glycol (SYSTANE) 0.4-0.3 % GEL ophthalmic gel Place 1 application into both eyes every 6 (six) hours as needed (dry eyes).     TART CHERRY PO Take by mouth. Take 1 tablet one time a day by mouth     thiamine (VITAMIN B-1) 50 MG tablet Take 100 mg by mouth daily.     TURMERIC PO Take 1 tablet by mouth daily.     No current facility-administered medications  for this visit.    PAST MEDICAL HISTORY: Past Medical History:  Diagnosis Date   ALLERGIC RHINITIS 07/26/2008   ASTHMA 04/14/2007   ASYMPTOMATIC POSTMENOPAUSAL STATUS 05/10/2008   Bronchiectasis (HCC)    CHEST PAIN 08/26/2008   COLONIC POLYPS, HX OF 04/14/2007   colonic leiomyoma   Decreased grip strength    Episcleritis    FIBROIDS, UTERUS 05/10/2008   Fibromyalgia    Gait abnormality    GERD 04/14/2007   HYPERCHOLESTEROLEMIA 01/07/2008   HYPERGLYCEMIA 11/28/2009   HYPERTENSION 01/28/2008   INSOMNIA 05/10/2008   Irritable bowel syndrome 04/06/2010   OSTEOARTHRITIS 11/28/2009   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    PAST SURGICAL HISTORY: Past Surgical History:  Procedure Laterality Date   COLONOSCOPY W/ POLYPECTOMY     ELECTROCARDIOGRAM  12/04/2006   IR RADIOLOGY PERIPHERAL GUIDED IV START  04/21/2018   IR US GUIDE VASC ACCESS LEFT  04/21/2018   NASAL SEPTUM SURGERY     Stress Cardiolite  02/13/2002    FAMILY HISTORY: Family History  Problem Relation Age of Onset   Diabetes Mother    Hypertension Mother    Glaucoma Mother    Polymyositis Mother    Heart disease Father        CHF   Allergic rhinitis Father    Asthma Father    Other Father        sideoblastic anemia   Allergic rhinitis Sister    Asthma Sister    Allergic rhinitis Maternal Aunt    Asthma Maternal Aunt    Allergic rhinitis Paternal Aunt    Asthma Paternal Aunt    Colon cancer Cousin    Allergic rhinitis Brother    Asthma Brother    Prostate cancer Brother    Sleep apnea Brother    Cancer Neg Hx        No FH of Colon Cancer   Angioedema Neg Hx    Atopy Neg Hx    Immunodeficiency Neg Hx    Urticaria Neg Hx    Eczema Neg Hx     SOCIAL HISTORY: Social History   Socioeconomic History   Marital status: Widowed    Spouse name: Not on file   Number of children: 3   Years of education: some college   Highest education  level: Not on file  Occupational History    Employer: RETIRED     Comment: Retired Journalist, newspaper)  Tobacco Use   Smoking status: Never    Passive exposure: Never   Smokeless tobacco: Never  Vaping Use   Vaping status: Never Used  Substance and Sexual Activity   Alcohol use: No   Drug use: No   Sexual activity: Never  Other Topics Concern   Not on file  Social History Narrative   Lives alone (widowed 1998).   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      2 sons and 1 daughter.        Right-handed.   No daily caffeine use.   Social Determinants of Health   Financial Resource Strain: Low Risk  (12/19/2022)   Overall Financial Resource Strain (CARDIA)    Difficulty of Paying Living Expenses: Not hard at all  Food Insecurity: No Food Insecurity (12/19/2022)   Hunger Vital Sign    Worried About Running Out of Food in the Last Year: Never true    Ran Out of Food in the Last Year: Never true  Transportation Needs: No Transportation Needs (12/19/2022)   PRAPARE - Administrator, Civil Service (Medical): No    Lack of Transportation (Non-Medical): No  Physical Activity: Inactive (12/19/2022)   Exercise Vital Sign    Days of Exercise per Week: 0 days    Minutes of Exercise per Session: 0 min  Stress: No Stress Concern Present (12/19/2022)   [employer] of Occupational Health - Occupational Stress Questionnaire    Feeling of Stress : Not at all  Social Connections: Moderately Isolated (12/19/2022)   Social Connection and Isolation Panel [NHANES]    Frequency of Communication with Friends and Family: More than three times a week    Frequency of Social Gatherings with Friends and Family: Twice a week    Attends Religious Services: More than 4 times per year    Active Member of [employer] or Organizations: No    Attends Banker Meetings: Never    Marital Status: Widowed  Intimate Partner Violence: Not At Risk (12/19/2022)   Humiliation, Afraid, Rape, and Kick questionnaire    Fear of Current or Ex-Partner: No     Emotionally Abused: No    Physically Abused: No    Sexually Abused: No      Levert Feinstein, M.D. Ph.D.  [facility] [address] [place], [place] [address] Phone: [phone] Fax:      [phone]

## 2023-06-05 NOTE — Procedures (Signed)
Full Name: Arlett Maiorino Gender: Female MRN #: [id number] Date of Birth: 1946/06/29    Visit Date: 06/05/2023 13:38 Age: 77 Years Examining Physician: Dr. Levert Feinstein Referring Physician: Dr. Levert Feinstein Height: 5 feet 2 inch History: 77 year old female, complains of diffuse body achy pain, low back pain, radiating pain to bilateral hip  Summary of the test:  Nerve conduction study:  Bilateral sural, superficial peroneal sensory responses were normal.  Bilateral tibial, peroneal to EDB motor responses were normal.  Bilateral tibial H reflexes were present and symmetric  Electromyography: Selected needle examinations of bilateral lower extremity muscles and bilateral lumbosacral paraspinal muscles were normal.  Conclusion: This is a normal study.  There is no electrodiagnostic evidence of intrinsic muscle disease or bilateral lumbosacral radiculopathy.    ------------------------------- Levert Feinstein, M.D. PhD  [facility] [address] [place], [place] [address] Tel: [phone] Fax: [phone]  Verbal informed consent was obtained from the patient, patient was informed of potential risk of procedure, including bruising, bleeding, hematoma formation, infection, muscle weakness, muscle pain, numbness, among others.        MNC    Nerve / Sites Muscle Latency Ref. Amplitude Ref. Rel Amp Segments Distance Velocity Ref. Area    ms ms mV mV %  cm m/s m/s mVms  L Peroneal - EDB     Ankle EDB 3.9 <=6.5 4.3 >=2.0 100 Ankle - EDB 9   12.9     Fib head EDB 9.1  4.1  95.3 Fib head - Ankle 25 48 >=44 12.6     Pop fossa EDB 10.9  3.9  94.9 Pop fossa - Fib head 10 57 >=44 13.2         Pop fossa - Ankle      R Peroneal - EDB     Ankle EDB 3.9 <=6.5 3.0 >=2.0 100 Ankle - EDB 9   8.7     Fib head EDB 9.9  2.6  85.3 Fib head - Ankle 27 45 >=44 8.1     Pop fossa EDB 11.9  2.7  102 Pop fossa - Fib head 12 58 >=44 8.1         Pop fossa - Ankle      L Tibial  - AH     Ankle AH 4.5 <=5.8 7.6 >=4.0 100 Ankle - AH 9   15.3     Pop fossa AH 12.1  12.3  162 Pop fossa - Ankle 37.6 49 >=41 25.9  R Tibial - AH     Ankle AH 4.6 <=5.8 14.0 >=4.0 100 Ankle - AH 9   29.1     Pop fossa AH 12.1  10.4  74.7 Pop fossa - Ankle 35 47 >=41 27.2             SNC    Nerve / Sites Rec. Site Peak Lat Ref.  Amp Ref. Segments Distance    ms ms V V  cm  L Sural - Ankle (Calf)     Calf Ankle 3.6 <=4.4 6 >=6 Calf - Ankle 14  R Sural - Ankle (Calf)     Calf Ankle 3.6 <=4.4 16 >=6 Calf - Ankle 14  L Superficial peroneal - Ankle     Lat leg Ankle 3.6 <=4.4 17 >=6 Lat leg - Ankle 14  R Superficial peroneal - Ankle     Lat leg Ankle 3.8 <=4.4 13 >=6 Lat leg - Ankle 14  F  Wave    Nerve F Lat Ref.   ms ms  L Tibial - AH 47.2 <=56.0  R Tibial - AH 45.1 <=56.0         H Reflex    Nerve H Lat Lat Hmax   ms ms   Left Right Ref. Left Right Ref.  Tibial - Soleus 36.6 37.6 <=35.0 21.4 35.5 <=35.0         EMG Summary Table    Spontaneous MUAP Recruitment  Muscle IA Fib PSW Fasc Other Amp Dur. Poly Pattern  R. Tibialis anterior Normal None None None _______ Normal Normal Normal Normal  R. Tibialis posterior Normal None None None _______ Normal Normal Normal Normal  R. Peroneus longus Normal None None None _______ Normal Normal Normal Normal  R. Gastrocnemius (Medial head) Normal None None None _______ Normal Normal Normal Normal  R. Vastus lateralis Normal None None None _______ Normal Normal Normal Normal  L. Tibialis anterior Normal None None None _______ Normal Normal Normal Normal  L. Tibialis posterior Normal None None None _______ Normal Normal Normal Normal  L. Peroneus longus Normal None None None _______ Normal Normal Normal Normal  L. Gastrocnemius (Medial head) Normal None None None _______ Normal Normal Normal Normal  L. Vastus lateralis Normal None None None _______ Normal Normal Normal Normal  L. Lumbar paraspinals (low) Normal None None  None _______ Normal Normal Normal Normal  L. Lumbar paraspinals (mid) Normal None None None _______ Normal Normal Normal Normal  R. Lumbar paraspinals (low) Normal None None None _______ Normal Normal Normal Normal  R. Lumbar paraspinals (mid) Normal None None None _______ Normal Normal Normal Normal

## 2023-06-06 LAB — SEDIMENTATION RATE: Sed Rate: 13 mm/hr (ref 0–40)

## 2023-06-06 LAB — C-REACTIVE PROTEIN: CRP: <1 mg/L (ref 0–10)

## 2023-06-06 LAB — ANA W/REFLEX IF POSITIVE: Anti Nuclear Antibody (ANA): NEGATIVE

## 2023-06-06 LAB — CK: Total CK: 131 U/L (ref 32–182)

## 2023-06-18 ENCOUNTER — Telehealth: Payer: Self-pay | Admitting: Adult Health

## 2023-06-18 NOTE — Telephone Encounter (Signed)
Pt would like a call from the nurse to discuss bloodwork results. Have heard from anyone about the results.

## 2023-06-23 ENCOUNTER — Other Ambulatory Visit: Payer: Self-pay | Admitting: Internal Medicine

## 2023-07-02 DIAGNOSIS — H1131 Conjunctival hemorrhage, right eye: Secondary | ICD-10-CM | POA: Diagnosis not present

## 2023-07-05 DIAGNOSIS — G4733 Obstructive sleep apnea (adult) (pediatric): Secondary | ICD-10-CM | POA: Diagnosis not present

## 2023-07-19 DIAGNOSIS — M79671 Pain in right foot: Secondary | ICD-10-CM | POA: Diagnosis not present

## 2023-07-19 DIAGNOSIS — M79672 Pain in left foot: Secondary | ICD-10-CM | POA: Diagnosis not present

## 2023-07-19 DIAGNOSIS — M25561 Pain in right knee: Secondary | ICD-10-CM | POA: Diagnosis not present

## 2023-07-19 DIAGNOSIS — M722 Plantar fascial fibromatosis: Secondary | ICD-10-CM | POA: Diagnosis not present

## 2023-07-19 DIAGNOSIS — M25562 Pain in left knee: Secondary | ICD-10-CM | POA: Diagnosis not present

## 2023-08-05 DIAGNOSIS — G4733 Obstructive sleep apnea (adult) (pediatric): Secondary | ICD-10-CM | POA: Diagnosis not present

## 2023-08-24 NOTE — Progress Notes (Signed)
Patient ID: Samantha [name], female    DOB: January 06, 1946, 77 y.o.   MRN: [id number]  HPI female never smoker followed for allergic rhinitis, asthma, bronchiectasis,  complicated by GERD/ LPR, Raynaud's, HBP, IBS, Glaucoma , Aortic Atherosclerosis,  Barium swallow 01/09/2016-normal PFT 06/05/2019- Mild restriction, normal flows and Diffusion Lab 04/10/21- EOS wnl, IgE 15 wnl HST 05/09/23- AHI 11.4/hr, desat to 89%, body weight 132 lbs -------------------------------------------------------------------------------------- 05/31/23- 77 year old female never smoker followed for Allergic Rhinitis, Asthma, Bronchiectasis, complicated by GERD/ LPR, Raynaud's, HBP, IBS, Glaucoma -Albuterol hfa, azelastine nasal, Nasalcrom, Qnasl, Symbicort 160, singulair, Zyrtec, Protonix, -Carafate, HST 05/09/23- AHI 11.4/hr, desat to 89%, body weight 132 lbs  For treatment decision We reviewed results of her sleep study and discussed management of mild OSA.  She has a dental bridge and probably is not a candidate for an oral appliance so we will try CPAP.  Target complaints are her frequent waking at night and daytime tiredness. She is quite pleased with how well she feels.  Her chest is very clear and she is not coughing. CXR 02/27/23- No active cardiopulmonary disease.  08/26/23- 77 year old female never smoker followed for Allergic Rhinitis, Asthma, Bronchiectasis, complicated by GERD/ LPR, Raynaud's, HBP, IBS, Glaucoma -Albuterol hfa, azelastine nasal, Nasalcrom, Qnasl, Symbicort 160, singulair, Zyrtec, Protonix, -Carafate, CPAP auto 5-15/ [facility]    ordered 05/31/23 Download compliance 67%, AHI 2.6/hr Body weight today Discussed the use of AI scribe software for clinical note transcription with the patient, who gave verbal consent to proceed. Discussed the use of AI scribe software for clinical note transcription with the patient, who gave verbal consent to proceed.  History of Present Illness   The patient, with a  history of mild sleep apnea, presents with dissatisfaction with their CPAP machine. They report discomfort with the nasal pillows mask and dryness in the nostrils and mouth. The patient also mentions an incident where the machine inflated the pillow over their nose, causing difficulty in breathing. Despite these issues, the patient acknowledges that the machine has improved their sleep duration on some nights.  The patient also reports chest discomfort, particularly in the area around the breastbone. They describe the pain as soreness that is exacerbated by pressure. The patient also mentions shortness of breath, which has been increasing in frequency.  The patient has been experiencing laryngitis since the previous week, which started with a headache. They report chest pain during this period, which they attribute to their heart beating hard. The patient also mentions dry eyes, which they believe are exacerbated by the CPAP machine.  The patient discusses their medications, expressing concern about the number of drugs they are taking. They mention being prescribed diltiazem by their cardiologist for chest pain, but express confusion about the need for this medication as they are already on two blood pressure medications. They also discuss their inhalers, noting that they have not received some of them despite prescriptions being sent.     Review of Systems-see HPI   + = positive Constitutional:   No weight loss, night sweats,  Fevers, chills, fatigue, lassitude. HEENT:   No headaches,  Difficulty swallowing,  Tooth/dental problems,  Sore throat,                No sneezing, itching, or ear ache,   nasal congestion, post nasal drip, CV:  + chest pain, orthopnea, PND, swelling in lower extremities, anasarca, dizziness, palpitations GI  No heartburn, indigestion, abdominal pain, nausea, vomiting,  Resp: No shortness of breath with  exertion or at rest.  No excess mucus, +productive cough,  +-non-productive  cough,  No coughing up of blood.  No change in color of mucus.  +infrequent wheezing.   Skin: Clear GU:  MS:  + joint pain or swelling.   Psych:  No change in mood or affect. No depression or anxiety.  No memory loss.   Objective:   Physical Exam General- Alert, Oriented, Affect-appropriate, Distress- none acute; pleasant  Skin- clear Lymphadenopathy- none Head- atraumatic            Eyes- Gross vision intact, PERRLA, conjunctivae clear secretions,            Ears- Normal hearing            Nose- clear, no-Septal dev, mucus, polyps, erosion, perforation .                        Throat- Malampatti III.   TMs not  retracted , mucosa not red, drainage- none, tonsils- atrophic;  +dental bridge, Neck- flexible , trachea midline, no stridor , thyroid nl, carotid no bruit Chest - symmetrical excursion , unlabored           Heart/CV- RRR , no murmur , no gallop  , no rub, nl s1 s2                           - JVD- none , edema-none, stasis changes- none, varices- none           Lung-  +crackles, cough- , dullness-none, rub- none, crackles- none           Chest wall-  Abd-  Br/ Gen/ Rectal- Not done, not indicated Extrem- cyanosis- none, clubbing, none, atrophy- none, strength- nl.  Neuro- grossly intact to observation  Assessment and Plan    Obstructive Sleep Apnea Mild sleep apnea with poor tolerance to CPAP therapy due to discomfort and dryness. Discussed the importance of consistent use for effective control of apneas. -Consider adjustment of humidifier setting on CPAP machine to alleviate dryness. -Explore alternative mask styles with home care company. -Referral to Dr. Myrtis Ser or local dentist for evaluation of suitability for fitted mouthpiece as an alternative to CPAP.  Chest Wall Pain Localized tenderness over the chest wall, likely costochondritis. Not suggestive of cardiac origin. -Consider over-the-counter Voltaren gel for local application. -Continue current pain management with  Tylenol as needed.  Shortness of Breath Reports increased shortness of breath. Last pulmonary function test was in 2020 and last chest x-ray in June 2024 was normal. -Order updated pulmonary function test.  Asthma Current use of Symbicort. Previous use of Xopenex inhaler for rescue, but not currently available. -Consider reordering Xopenex inhaler if Ventolin is not effective or causes discomfort.  General Health Maintenance -Received flu shot. Discussed optional RSV vaccine. -Current pneumonia vaccination status is adequate with Prevnar 13. No immediate need for Pneumovax 23.

## 2023-08-26 ENCOUNTER — Ambulatory Visit: Payer: Medicare PPO | Admitting: Internal Medicine

## 2023-08-26 ENCOUNTER — Encounter: Payer: Self-pay | Admitting: Internal Medicine

## 2023-08-26 VITALS — BP 151/64 | HR 67 | Temp 98.1°F | Resp 18 | Ht 61.5 in | Wt 132.6 lb

## 2023-08-26 DIAGNOSIS — G4733 Obstructive sleep apnea (adult) (pediatric): Secondary | ICD-10-CM | POA: Diagnosis not present

## 2023-08-26 DIAGNOSIS — R0683 Snoring: Secondary | ICD-10-CM | POA: Diagnosis not present

## 2023-08-26 MED ORDER — AZELASTINE HCL 0.15 % NA SOLN: NASAL | 3 refills | Status: DC

## 2023-08-26 MED ORDER — MONTELUKAST SODIUM 10 MG PO TABS: ORAL_TABLET | ORAL | 3 refills | Status: DC

## 2023-08-26 NOTE — Patient Instructions (Signed)
Order- DME [facility]- please refit mask of choice for comfort  Order- referral to Dr Althea Grimmer, Orthodontist  consider oral appliance for OSA   You can ask your family dentist or a local Orthodontist to consider if they  could make an oral appliance to treat your mild sleep apnea. Your bridge work might get in the way, but you can ask.

## 2023-09-04 DIAGNOSIS — G4733 Obstructive sleep apnea (adult) (pediatric): Secondary | ICD-10-CM | POA: Diagnosis not present

## 2023-09-18 ENCOUNTER — Telehealth: Payer: Self-pay

## 2023-09-18 NOTE — Telephone Encounter (Signed)
 Faxed correctlab codes to [facility] for billing on this pt to get corrected

## 2023-09-20 ENCOUNTER — Ambulatory Visit (INDEPENDENT_AMBULATORY_CARE_PROVIDER_SITE_OTHER): Payer: Medicare PPO | Admitting: Family Medicine

## 2023-09-20 ENCOUNTER — Encounter: Payer: Self-pay | Admitting: Family Medicine

## 2023-09-20 VITALS — BP 138/62 | HR 66 | Temp 97.8°F | Ht 61.5 in | Wt 133.4 lb

## 2023-09-20 DIAGNOSIS — J0100 Acute maxillary sinusitis, unspecified: Secondary | ICD-10-CM | POA: Diagnosis not present

## 2023-09-20 MED ORDER — HYDROCHLOROTHIAZIDE 12.5 MG PO TABS: ORAL_TABLET | ORAL | Status: DC

## 2023-09-20 MED ORDER — AMOXICILLIN-POT CLAVULANATE 875-125 MG PO TABS: 1.0000 | ORAL_TABLET | Freq: Two times a day (BID) | ORAL | 0 refills | Status: DC

## 2023-09-20 NOTE — Patient Instructions (Signed)
 Rest, fluids, and start augmentin.  Use your inhaler and it is okay to take diltiazem as needed.  Take care.  Glad to see you.

## 2023-09-20 NOTE — Progress Notes (Signed)
 She had used diltiazem  prn for chest discomfort and it helped.  Used prn but not recently.   Rx'd per cardiology.    She has nasal dryness with CPAP use.  She had to stop using it in the meantime.  Chest is sore, recently noted.  No fevers.  Recently with facial pain, B maxillary pain.  No frontal pain.  Some cough but no sputum.  No ear pain.  Throat feels irritated.  No vomiting.  Some diarrhea after diet change recently.    Change in urine odor but not burning with urination.    Meds, vitals, and allergies reviewed.   ROS: Per HPI unless specifically indicated in ROS section   GEN: nad, alert and oriented HEENT: mucous membranes moist, tm w/o erythema, nasal exam w/o erythema, clear discharge noted,  L nostril stuffy. OP with cobblestoning, max sinuses ttp B NECK: supple w/o LA CV: rrr.   PULM: ctab, no inc wob EXT: no edema Abd not ttp.   SKIN: well perfused.

## 2023-09-22 NOTE — Assessment & Plan Note (Signed)
 Rest, fluids, and start augmentin.  Okay to use albuterol as needed.  Update me as needed.  Okay for outpatient f/u.

## 2023-09-25 ENCOUNTER — Ambulatory Visit: Payer: Self-pay | Admitting: Family Medicine

## 2023-09-25 NOTE — Telephone Encounter (Signed)
 I think ER/UC eval makes sense if clear worse in the meantime.  O/w keep OV as schedule for tomorrow.  I am not in clinic to see patient today.  Thanks.

## 2023-09-25 NOTE — Telephone Encounter (Addendum)
 Copied from CRM [id number]. Topic: Clinical - Red Word Triage >> Sep 25, 2023  1:56 PM [name] wrote: Red Word that prompted transfer to Nurse Triage: Taken antibiotic and now your side is hurting and she thinks she may have pneumonia. Currently in pain on right side.   Chief Complaint:   Patient  was diagnosed with Sinus Infection on 09/23/2023.Taking Antibiotic but now feel like she has Pneumonia . Patient  states her "sides  are hurting". RN Agent clarified her ribs hurt when she breathes. Symptoms: Pain in her sides(ribs) when she breathes. Temperature is 97.1 ( Patient states that typically happen when she has pneumonia. Frequency:  Since  Yesterday. Pertinent Negatives: Patient denies  chest pain.  Disposition: [] ED /[] Urgent [name] (no appt availability in office) / [x] Appointment(In office/virtual)/ []  Samantha [name]/ [] Home [name]/ [] Refused Recommended Disposition /[]  Mobile Bus/ []  Follow-up with PCP Additional Notes:  Scheduled for  in person office visit for 09/26/23. Advised patient is she feel like she is getting worse to proceed to Urgent [name] or Emergency Room if she is having any trouble breathing. Patient verbalized understanding of the recommendations. RN did call the office to see if the provider would give her chest xray and prednisone . Spoke with [facility] who stated all of the provider have left for today. Advised this RN agent would let patient know. . Reason for Disposition  [1] Taking antibiotic > 72 hours (3 days) AND [2] sinus pain not improved  Answer Assessment - Initial Assessment Questions 1. ANTIBIOTIC: "What antibiotic are you taking?" "How many times a day?"      Antibiotic since last Friday  2. ONSET: "When was the antibiotic started?"      3. PAIN: "How bad is the sinus pain?"   (Scale 1-10; mild, moderate or severe)   - MILD (1-3): doesn't interfere with normal activities        4. FEVER: "Do you have a fever?" If Yes, ask: "What is  it, how was it measured, and when did it start?"      97 5. SYMPTOMS: "Are there any other symptoms you're concerned about?" If Yes, ask: "When did it start?"      6. PREGNANCY: "Is there any chance you are pregnant?" "When was your last menstrual period?"  Protocols used: Sinus Infection on Antibiotic Follow-up Call-A-[facility]

## 2023-09-26 ENCOUNTER — Ambulatory Visit
Admission: RE | Admit: 2023-09-26 | Discharge: 2023-09-26 | Disposition: A | Discharge status: Home / Self Care | Payer: Medicare PPO | Source: Ambulatory Visit | Attending: Internal Medicine | Admitting: Internal Medicine

## 2023-09-26 ENCOUNTER — Telehealth: Payer: Self-pay | Admitting: Internal Medicine

## 2023-09-26 ENCOUNTER — Encounter: Payer: Self-pay | Admitting: Internal Medicine

## 2023-09-26 ENCOUNTER — Ambulatory Visit: Payer: Medicare PPO | Admitting: Internal Medicine

## 2023-09-26 VITALS — BP 130/70 | HR 96 | Temp 97.8°F | Ht 61.5 in | Wt 135.0 lb

## 2023-09-26 DIAGNOSIS — G4733 Obstructive sleep apnea (adult) (pediatric): Secondary | ICD-10-CM

## 2023-09-26 DIAGNOSIS — R0602 Shortness of breath: Secondary | ICD-10-CM

## 2023-09-26 DIAGNOSIS — R053 Chronic cough: Secondary | ICD-10-CM | POA: Diagnosis not present

## 2023-09-26 MED ORDER — AZITHROMYCIN 250 MG PO TABS: ORAL_TABLET | ORAL | 0 refills | Status: DC

## 2023-09-26 MED ORDER — LEVOFLOXACIN 250 MG PO TABS: 250.0000 mg | ORAL_TABLET | Freq: Every day | ORAL | 0 refills | Status: DC

## 2023-09-26 NOTE — Assessment & Plan Note (Addendum)
Sinus symptoms better with the augmentin but persistent productive cough and some degree of pleuritic pain Will check CXR CXR shows possible increased interstitial markings bilaterally---but no sig change from June (read as normal) Will change antibiotic ---allergic to doxy and azithromycin never helps Will actually add levaquin 250mg  daily x 7 days (discussed to stop if pain around joints, etc)  After discussion, will try the z-pak and not use the levaquin

## 2023-09-26 NOTE — Telephone Encounter (Signed)
Called and spoke with pt, she would like to return cpap and do oral device. Dr.Young please advise if its okay to place a order for D/C

## 2023-09-26 NOTE — Telephone Encounter (Signed)
I don't see an urgent care note. Will plan to assess at the OV

## 2023-09-26 NOTE — Progress Notes (Signed)
Subjective:    Patient ID: Samantha [name], female    DOB: 1946/03/21, 78 y.o.   MRN: [id number]  HPI Here due to persistent respiratory symptoms  Here 6 days ago---after ill for 3-4 days Diagnosed with sinus infection Taking augmentin since then Then started with side and rib pain soon after--both sides (right worse) Really SOB--inhalers help a little No fever Some sweats but no chills Pain with breathing--both sides Cough with thick white sputum No headache now  No ear pain but throat is irritated  Grandson was diagnosed with walking pneumonia  Current Outpatient Medications on File Prior to Visit  Medication Sig Dispense Refill   amoxicillin-clavulanate (AUGMENTIN) 875-125 MG tablet Take 1 tablet by mouth 2 (two) times daily. 20 tablet 0   Azelastine HCl 0.15 % SOLN 2 puffs each nostril twice daily as needed 90 mL 3   betamethasone, augmented, (DIPROLENE) 0.05 % lotion as needed (itching).     bifidobacterium infantis (ALIGN) capsule Take 1 capsule by mouth daily.     budesonide-formoterol (SYMBICORT) 160-4.5 MCG/ACT inhaler Inhale 2 puffs then rinse mouth, twice daily 3 each 4   cetirizine (ZYRTEC) 10 MG tablet Take 1 tablet (10 mg total) by mouth at bedtime. 90 tablet 4   Cholecalciferol (VITAMIN D) 50 MCG (2000 UT) CAPS Take 1 capsule by mouth daily.     Coenzyme Q10 (CO Q 10) 60 MG CAPS 1 capsule with a meal     cromolyn (NASALCROM) 5.2 MG/ACT nasal spray Place 1 spray into both nostrils 2 (two) times daily as needed for allergies. 78 mL 3   diltiazem (CARDIZEM) 30 MG tablet May take every 8 hours as needed for chest discomfort 30 tablet 11   hydrochlorothiazide (HYDRODIURIL) 12.5 MG tablet TAKE 1 TABLET BY MOUTH DAILY     levalbuterol (XOPENEX HFA) 45 MCG/ACT inhaler Inhale 1-2 puffs into the lungs every 6 (six) hours as needed for wheezing. 1 each 12   levalbuterol (XOPENEX) 1.25 MG/3ML nebulizer solution Take 1.25 mg by nebulization every 6 (six) hours as needed for  wheezing. 72 mL 12   losartan (COZAAR) 50 MG tablet Take 1 tablet (50 mg total) by mouth daily. 90 tablet 3   Magnesium Malate POWD (1250mg  tabs) 1 tablet by mouth qhs     Manganese 50 MG TABS Take 50 mg by mouth at bedtime.      mometasone (ELOCON) 0.1 % lotion as needed (otc irritation).     montelukast (SINGULAIR) 10 MG tablet 1 daily 90 tablet 3   Olopatadine HCl (PATADAY) 0.2 % SOLN Place 1 drop into both eyes daily as needed (itchy eyes).     pantoprazole (PROTONIX) 40 MG tablet Take 1 tablet (40 mg total) by mouth 2 (two) times daily. DX K21.9 and K58.9 180 tablet 1   Polyethyl Glycol-Propyl Glycol (SYSTANE) 0.4-0.3 % GEL ophthalmic gel Place 1 application into both eyes every 6 (six) hours as needed (dry eyes).     TART CHERRY PO Take by mouth. Take 1 tablet one time a day by mouth     thiamine (VITAMIN B-1) 50 MG tablet Take 100 mg by mouth daily.     TURMERIC PO Take 1 tablet by mouth daily.     No current facility-administered medications on file prior to visit.    Allergies  Allergen Reactions   2,4-D Dimethylamine    Albuterol     Tachycardia   Cefuroxime Axetil     REACTION: pt not sure of reaction- she  can tolerate amoxil   Doxycycline Itching   Iodinated Contrast Media Other (See Comments) and Itching   Latex Itching   Lisinopril     Cough   Lovastatin Other (See Comments)    Muscle aches    Sulfonamide Derivatives     REACTION: pt not sure of reaction    Past Medical History:  Diagnosis Date   ALLERGIC RHINITIS 07/26/2008   ASTHMA 04/14/2007   ASYMPTOMATIC POSTMENOPAUSAL STATUS 05/10/2008   Bronchiectasis (HCC)    CHEST PAIN 08/26/2008   COLONIC POLYPS, HX OF 04/14/2007   colonic leiomyoma   Decreased grip strength    Episcleritis    FIBROIDS, UTERUS 05/10/2008   Fibromyalgia    Gait abnormality    GERD 04/14/2007   HYPERCHOLESTEROLEMIA 01/07/2008   HYPERGLYCEMIA 11/28/2009   HYPERTENSION 01/28/2008   INSOMNIA 05/10/2008   Irritable bowel  syndrome 04/06/2010   OSTEOARTHRITIS 11/28/2009   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    Past Surgical History:  Procedure Laterality Date   COLONOSCOPY W/ POLYPECTOMY     ELECTROCARDIOGRAM  12/04/2006   IR RADIOLOGY PERIPHERAL GUIDED IV START  04/21/2018   IR US GUIDE VASC ACCESS LEFT  04/21/2018   NASAL SEPTUM SURGERY     Stress Cardiolite  02/13/2002    Family History  Problem Relation Age of Onset   Diabetes Mother    Hypertension Mother    Glaucoma Mother    Polymyositis Mother    Heart disease Father        CHF   Allergic rhinitis Father    Asthma Father    Other Father        sideoblastic anemia   Allergic rhinitis Sister    Asthma Sister    Allergic rhinitis Maternal Aunt    Asthma Maternal Aunt    Allergic rhinitis Paternal Aunt    Asthma Paternal Aunt    Colon cancer Cousin    Allergic rhinitis Brother    Asthma Brother    Prostate cancer Brother    Sleep apnea Brother    Cancer Neg Hx        No FH of Colon Cancer   Angioedema Neg Hx    Atopy Neg Hx    Immunodeficiency Neg Hx    Urticaria Neg Hx    Eczema Neg Hx     Social History   Socioeconomic History   Marital status: Widowed    Spouse name: Not on file   Number of children: 3   Years of education: some college   Highest education level: Not on file  Occupational History    Employer: RETIRED    Comment: Retired Journalist, newspaper)  Tobacco Use   Smoking status: Never    Passive exposure: Never   Smokeless tobacco: Never  Vaping Use   Vaping status: Never Used  Substance and Sexual Activity   Alcohol use: No   Drug use: No   Sexual activity: Never  Other Topics Concern   Not on file  Social History Narrative   Lives alone (widowed 1998).   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      2 sons and 1 daughter.        Right-handed.   No daily caffeine use.   Social Drivers of Corporate investment banker Strain: Low Risk  (12/19/2022)   Overall Financial Resource Strain (CARDIA)     Difficulty of Paying Living Expenses: Not hard at all  Food Insecurity: No Food Insecurity (12/19/2022)  Hunger Vital Sign    Worried About Running Out of Food in the Last Year: Never true    Ran Out of Food in the Last Year: Never true  Transportation Needs: No Transportation Needs (12/19/2022)   PRAPARE - Administrator, Civil Service (Medical): No    Lack of Transportation (Non-Medical): No  Physical Activity: Inactive (12/19/2022)   Exercise Vital Sign    Days of Exercise per Week: 0 days    Minutes of Exercise per Session: 0 min  Stress: No Stress Concern Present (12/19/2022)   [employer] of Occupational Health - Occupational Stress Questionnaire    Feeling of Stress : Not at all  Social Connections: Moderately Isolated (12/19/2022)   Social Connection and Isolation Panel [NHANES]    Frequency of Communication with Friends and Family: More than three times a week    Frequency of Social Gatherings with Friends and Family: Twice a week    Attends Religious Services: More than 4 times per year    Active Member of [employer] or Organizations: No    Attends Banker Meetings: Never    Marital Status: Widowed  Intimate Partner Violence: Not At Risk (12/19/2022)   Humiliation, Afraid, Rape, and Kick questionnaire    Fear of Current or Ex-Partner: No    Emotionally Abused: No    Physically Abused: No    Sexually Abused: No   Review of Systems No N/V      Objective:   Physical Exam Constitutional:      Appearance: Normal appearance.  HENT:     Head:     Comments: Right frontal tenderness    Right Ear: Tympanic membrane and ear canal normal.     Left Ear: Tympanic membrane and ear canal normal.     Mouth/Throat:     Pharynx: No oropharyngeal exudate or posterior oropharyngeal erythema.  Pulmonary:     Effort: Pulmonary effort is normal.     Breath sounds: Normal breath sounds. No wheezing or rales.  Musculoskeletal:     Cervical back: Neck supple.   Lymphadenopathy:     Cervical: No cervical adenopathy.  Neurological:     Mental Status: She is alert.            Assessment & Plan:

## 2023-09-26 NOTE — Telephone Encounter (Signed)
Patient needs a letter of discontinuation faxed to [facility] so she can return her CPAP Machine. Fax number [phone]

## 2023-09-26 NOTE — Addendum Note (Signed)
Addended by: Tillman Abide I on: 09/26/2023 11:45 AM   Modules accepted: Orders

## 2023-09-27 NOTE — Telephone Encounter (Signed)
Order- D/C CPAP- notify her DME

## 2023-09-27 NOTE — Telephone Encounter (Signed)
Patient is calling back about CPAP machine and would like an update. Please call back at [phone] or [phone]

## 2023-10-01 NOTE — Telephone Encounter (Signed)
I have placed referral to d/c CPAP  Pt asked that we mail her a copy of the referral  I verified her address and have placed in outgoing mail  Nothing further needed

## 2023-10-01 NOTE — Telephone Encounter (Signed)
Patient checking on D/C CPAP machine. Patient uses [facility]. Patient phone number is [phone] and [phone].

## 2023-10-02 ENCOUNTER — Encounter: Payer: Self-pay | Admitting: Internal Medicine

## 2023-10-08 NOTE — Telephone Encounter (Deleted)
Copied from CRM [id number]. Topic: Appointments - Appointment Scheduling >> Oct 08, 2023  3:23 PM Tiffany H wrote: Patient/patient representative is calling to schedule an appointment. Refer to attachments for appointment information.   Patient advised that after two rounds of antibiotics, she is still feeling poorly. She has had to use asthma inhaler. Patient declined RN triage. Scheduled for 10/09/23 at 9:30AM.

## 2023-10-09 ENCOUNTER — Ambulatory Visit (INDEPENDENT_AMBULATORY_CARE_PROVIDER_SITE_OTHER): Payer: Medicare PPO | Admitting: Internal Medicine

## 2023-10-09 ENCOUNTER — Encounter: Payer: Self-pay | Admitting: Internal Medicine

## 2023-10-09 VITALS — BP 130/70 | HR 63 | Temp 98.5°F | Ht 61.5 in | Wt 133.0 lb

## 2023-10-09 DIAGNOSIS — J0100 Acute maxillary sinusitis, unspecified: Secondary | ICD-10-CM | POA: Diagnosis not present

## 2023-10-09 MED ORDER — PREDNISONE 20 MG PO TABS
40.0000 mg | ORAL_TABLET | Freq: Every day | ORAL | 0 refills | Status: DC
Start: 2023-10-09 — End: 2023-12-30

## 2023-10-09 NOTE — Assessment & Plan Note (Signed)
Ongoing sinus symptoms Unclear if this is atypical infection CXR had been clear and no chest symptoms Discussed options---will try prednisone 40 x 3, 20 x 3 Consider refill of augmentin if worsens again--she can just send message

## 2023-10-09 NOTE — Progress Notes (Signed)
Subjective:    Patient ID: Samantha [name], female    DOB: Oct 06, 1945, 78 y.o.   MRN: [id number]  HPI Here due to ongoing respiratory symptoms  Feels a little better but "it just won't clear up" May have felt some better on the z-pak Still has headache--mostly maxillary Some blood from nose yesterday after using saline Some post nasal drip---thick stuff Some cough Still SOB  Current Outpatient Medications on File Prior to Visit  Medication Sig Dispense Refill   Azelastine HCl 0.15 % SOLN 2 puffs each nostril twice daily as needed 90 mL 3   betamethasone, augmented, (DIPROLENE) 0.05 % lotion as needed (itching).     bifidobacterium infantis (ALIGN) capsule Take 1 capsule by mouth daily.     budesonide-formoterol (SYMBICORT) 160-4.5 MCG/ACT inhaler Inhale 2 puffs then rinse mouth, twice daily 3 each 4   cetirizine (ZYRTEC) 10 MG tablet Take 1 tablet (10 mg total) by mouth at bedtime. 90 tablet 4   Cholecalciferol (VITAMIN D) 50 MCG (2000 UT) CAPS Take 1 capsule by mouth daily.     Coenzyme Q10 (CO Q 10) 60 MG CAPS 1 capsule with a meal     cromolyn (NASALCROM) 5.2 MG/ACT nasal spray Place 1 spray into both nostrils 2 (two) times daily as needed for allergies. 78 mL 3   diltiazem (CARDIZEM) 30 MG tablet May take every 8 hours as needed for chest discomfort 30 tablet 11   hydrochlorothiazide (HYDRODIURIL) 12.5 MG tablet TAKE 1 TABLET BY MOUTH DAILY     levalbuterol (XOPENEX HFA) 45 MCG/ACT inhaler Inhale 1-2 puffs into the lungs every 6 (six) hours as needed for wheezing. 1 each 12   levalbuterol (XOPENEX) 1.25 MG/3ML nebulizer solution Take 1.25 mg by nebulization every 6 (six) hours as needed for wheezing. 72 mL 12   losartan (COZAAR) 50 MG tablet Take 1 tablet (50 mg total) by mouth daily. 90 tablet 3   Magnesium Malate POWD (1250mg  tabs) 1 tablet by mouth qhs     Manganese 50 MG TABS Take 50 mg by mouth at bedtime.      mometasone (ELOCON) 0.1 % lotion as needed (otc irritation).      montelukast (SINGULAIR) 10 MG tablet 1 daily 90 tablet 3   Olopatadine HCl (PATADAY) 0.2 % SOLN Place 1 drop into both eyes daily as needed (itchy eyes).     pantoprazole (PROTONIX) 40 MG tablet Take 1 tablet (40 mg total) by mouth 2 (two) times daily. DX K21.9 and K58.9 180 tablet 1   Polyethyl Glycol-Propyl Glycol (SYSTANE) 0.4-0.3 % GEL ophthalmic gel Place 1 application into both eyes every 6 (six) hours as needed (dry eyes).     TART CHERRY PO Take by mouth. Take 1 tablet one time a day by mouth     thiamine (VITAMIN B-1) 50 MG tablet Take 100 mg by mouth daily.     TURMERIC PO Take 1 tablet by mouth daily.     No current facility-administered medications on file prior to visit.    Allergies  Allergen Reactions   2,4-D Dimethylamine    Albuterol     Tachycardia   Cefuroxime Axetil     REACTION: pt not sure of reaction- she can tolerate amoxil   Doxycycline Itching   Iodinated Contrast Media Other (See Comments) and Itching   Latex Itching   Lisinopril     Cough   Lovastatin Other (See Comments)    Muscle aches    Sulfonamide Derivatives  REACTION: pt not sure of reaction    Past Medical History:  Diagnosis Date   ALLERGIC RHINITIS 07/26/2008   ASTHMA 04/14/2007   ASYMPTOMATIC POSTMENOPAUSAL STATUS 05/10/2008   Bronchiectasis (HCC)    CHEST PAIN 08/26/2008   COLONIC POLYPS, HX OF 04/14/2007   colonic leiomyoma   Decreased grip strength    Episcleritis    FIBROIDS, UTERUS 05/10/2008   Fibromyalgia    Gait abnormality    GERD 04/14/2007   HYPERCHOLESTEROLEMIA 01/07/2008   HYPERGLYCEMIA 11/28/2009   HYPERTENSION 01/28/2008   INSOMNIA 05/10/2008   Irritable bowel syndrome 04/06/2010   OSTEOARTHRITIS 11/28/2009   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    Past Surgical History:  Procedure Laterality Date   COLONOSCOPY W/ POLYPECTOMY     ELECTROCARDIOGRAM  12/04/2006   IR RADIOLOGY PERIPHERAL GUIDED IV START  04/21/2018   IR US GUIDE VASC ACCESS LEFT  04/21/2018    NASAL SEPTUM SURGERY     Stress Cardiolite  02/13/2002    Family History  Problem Relation Age of Onset   Diabetes Mother    Hypertension Mother    Glaucoma Mother    Polymyositis Mother    Heart disease Father        CHF   Allergic rhinitis Father    Asthma Father    Other Father        sideoblastic anemia   Allergic rhinitis Sister    Asthma Sister    Allergic rhinitis Maternal Aunt    Asthma Maternal Aunt    Allergic rhinitis Paternal Aunt    Asthma Paternal Aunt    Colon cancer Cousin    Allergic rhinitis Brother    Asthma Brother    Prostate cancer Brother    Sleep apnea Brother    Cancer Neg Hx        No FH of Colon Cancer   Angioedema Neg Hx    Atopy Neg Hx    Immunodeficiency Neg Hx    Urticaria Neg Hx    Eczema Neg Hx     Social History   Socioeconomic History   Marital status: Widowed    Spouse name: Not on file   Number of children: 3   Years of education: some college   Highest education level: Not on file  Occupational History    Employer: RETIRED    Comment: Retired Journalist, newspaper)  Tobacco Use   Smoking status: Never    Passive exposure: Never   Smokeless tobacco: Never  Vaping Use   Vaping status: Never Used  Substance and Sexual Activity   Alcohol use: No   Drug use: No   Sexual activity: Never  Other Topics Concern   Not on file  Social History Narrative   Lives alone (widowed 1998).   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      2 sons and 1 daughter.        Right-handed.   No daily caffeine use.   Social Drivers of Corporate investment banker Strain: Low Risk  (12/19/2022)   Overall Financial Resource Strain (CARDIA)    Difficulty of Paying Living Expenses: Not hard at all  Food Insecurity: No Food Insecurity (12/19/2022)   Hunger Vital Sign    Worried About Running Out of Food in the Last Year: Never true    Ran Out of Food in the Last Year: Never true  Transportation Needs: No Transportation Needs (12/19/2022)    PRAPARE - Administrator, Civil Service (  Medical): No    Lack of Transportation (Non-Medical): No  Physical Activity: Inactive (12/19/2022)   Exercise Vital Sign    Days of Exercise per Week: 0 days    Minutes of Exercise per Session: 0 min  Stress: No Stress Concern Present (12/19/2022)   [employer] of Occupational Health - Occupational Stress Questionnaire    Feeling of Stress : Not at all  Social Connections: Moderately Isolated (12/19/2022)   Social Connection and Isolation Panel [NHANES]    Frequency of Communication with Friends and Family: More than three times a week    Frequency of Social Gatherings with Friends and Family: Twice a week    Attends Religious Services: More than 4 times per year    Active Member of [employer] or Organizations: No    Attends Banker Meetings: Never    Marital Status: Widowed  Intimate Partner Violence: Not At Risk (12/19/2022)   Humiliation, Afraid, Rape, and Kick questionnaire    Fear of Current or Ex-Partner: No    Emotionally Abused: No    Physically Abused: No    Sexually Abused: No   Review of Systems Chronic sleep problems---chest bothers her more lying down (keeps head elevated) Eating okay     Objective:   Physical Exam Constitutional:      Appearance: Normal appearance.  HENT:     Head:     Comments: Right maxillary and frontal tenderness    Right Ear: Tympanic membrane and ear canal normal.     Left Ear: Tympanic membrane and ear canal normal.     Mouth/Throat:     Pharynx: No oropharyngeal exudate or posterior oropharyngeal erythema.  Pulmonary:     Effort: Pulmonary effort is normal.     Breath sounds: Normal breath sounds. No wheezing or rales.  Musculoskeletal:     Cervical back: Neck supple.  Lymphadenopathy:     Cervical: No cervical adenopathy.  Neurological:     Mental Status: She is alert.            Assessment & Plan:

## 2023-10-15 ENCOUNTER — Telehealth: Payer: Self-pay | Admitting: Family Medicine

## 2023-10-15 ENCOUNTER — Other Ambulatory Visit: Payer: Self-pay

## 2023-10-15 DIAGNOSIS — B379 Candidiasis, unspecified: Secondary | ICD-10-CM

## 2023-10-15 MED ORDER — LOSARTAN POTASSIUM 50 MG PO TABS: 50.0000 mg | ORAL_TABLET | Freq: Every day | ORAL | 0 refills | Status: DC

## 2023-10-15 NOTE — Telephone Encounter (Signed)
 Copied from CRM [id number]. Topic: Clinical - Medication Refill >> Oct 15, 2023  9:28 AM Richerd A wrote: Most Recent Primary Care Visit:  Provider: JIMMY ADE I  Department: [facility]  Visit Type: OFFICE VISIT  Date: 10/09/2023  Medication: losartan  (COZAAR ) 50 MG tablet  Has the patient contacted their pharmacy? No (Agent: If no, request that the patient contact the pharmacy for the refill. If patient does not wish to contact the pharmacy document the reason why and proceed with request.) (Agent: If yes, when and what did the pharmacy advise?)  Is this the correct pharmacy for this prescription? No If no, delete pharmacy and type the correct one.  This is the patient's preferred pharmacy:   [facility] USE [facility] [place] [address]  [facility] - [place], [place] - [address] [place] [phone] Phone: [phone] Fax: [phone]  [id number] [place], [place] - [address] AT [facility] [address] [place] [address] Phone: [phone] Fax: [phone]  [id number] [place], [place] - [address] [place] [address] Phone: [phone] Fax: [phone]  [facility] - [place], [place] - [address] [place] [address] Phone: [phone] Fax: [phone]   Has the prescription been filled recently? No  Is the patient out of the medication? Yes  Has the patient been seen for an appointment in the last year OR does the patient have an upcoming appointment? Yes  Can we respond through MyChart? Yes  Agent: Please be advised that Rx refills may take up to 3 business days. We ask that you follow-up with your pharmacy.

## 2023-10-15 NOTE — Telephone Encounter (Signed)
 Copied from CRM [id number]. Topic: Clinical - Medication Refill >> Oct 15, 2023  9:32 AM Richerd A wrote: Most Recent Primary Care Visit:  Provider: JIMMY ADE I  Department: [facility]  Visit Type: OFFICE VISIT  Date: 10/09/2023  Medication: Diflucan  Erythromycin  Has the patient contacted their pharmacy? No (Agent: If no, request that the patient contact the pharmacy for the refill. If patient does not wish to contact the pharmacy document the reason why and proceed with request.) (Agent: If yes, when and what did the pharmacy advise?)  PLEASE USE - [id number] [place], [place] - [address] [place] [address] Phone: [phone] Fax: [phone]   Is this the correct pharmacy for this prescription? No If no, delete pharmacy and type the correct one.  This is the patient's preferred pharmacy:  [facility] - [place], [place] - [address] [place] [address] Phone: [phone] Fax: [phone]  [id number] [place], [place] - [address] AT [facility] OF [address] [place] [address] Phone: [phone] Fax: [phone]  [id number] - [place], [place] - [address] [place] [address] Phone: [phone] Fax: [phone]  [facility] - [place], [place] - [address] [place] [address] Phone: [phone] Fax: [phone]   Has the prescription been filled recently? No  Is the patient out of the medication? Yes  Has the patient been seen for an appointment in the last year OR does the patient have an upcoming appointment? Yes  Can we respond through MyChart? Yes  Agent: Please be advised that Rx refills may take up to 3 business days. We ask that you follow-up with your pharmacy.

## 2023-10-15 NOTE — Telephone Encounter (Signed)
 Spoke with patient in reference to the losartan  the patient is just needing some sent to the local pharmacy until hers arrive via mail. She only has two remaining. I placed rx order for prescription.   Patient also states that she is still having issues and is wondering can she get a refill of the azithromycin  on the amoxicillin -clavulanate. Please advise

## 2023-10-16 MED ORDER — FLUCONAZOLE 150 MG PO TABS
150.0000 mg | ORAL_TABLET | Freq: Once | ORAL | 0 refills | Status: AC
Start: 2023-10-16 — End: 2023-10-16

## 2023-10-16 MED ORDER — AMOXICILLIN-POT CLAVULANATE 875-125 MG PO TABS: 1.0000 | ORAL_TABLET | Freq: Two times a day (BID) | ORAL | 0 refills | Status: DC

## 2023-10-16 NOTE — Telephone Encounter (Signed)
 Patient says thank you. But she is wanting diflucan  as well because everytime she takes an antibiotic she gets a yeast infection

## 2023-10-16 NOTE — Addendum Note (Signed)
Addended by: Joaquim Nam on: 10/16/2023 03:48 PM   Modules accepted: Orders

## 2023-10-16 NOTE — Telephone Encounter (Signed)
 Thank you for sent losartan .  I sent augmentin .  Needs recheck if not better when done with augmentin .  Thanks.

## 2023-10-16 NOTE — Addendum Note (Signed)
 Addended by: Donnie Galea on: 10/16/2023 01:50 PM   Modules accepted: Orders

## 2023-10-16 NOTE — Telephone Encounter (Signed)
 Sent. Thanks.

## 2023-10-17 ENCOUNTER — Telehealth: Payer: Self-pay | Admitting: Internal Medicine

## 2023-10-17 NOTE — Telephone Encounter (Signed)
 Patient notified

## 2023-10-17 NOTE — Telephone Encounter (Signed)
 Patient states needs refill for Nebulizer solution and Xopenex  HFA inhaler.Also would like to discuss CPAP machine. Pharmacy is [facility]. Patient phone number is [phone] and [phone].

## 2023-10-21 MED ORDER — LEVALBUTEROL TARTRATE 45 MCG/ACT IN AERO: 1.0000 | INHALATION_SPRAY | Freq: Four times a day (QID) | RESPIRATORY_TRACT | 12 refills | Status: DC | PRN

## 2023-10-21 MED ORDER — LEVALBUTEROL HCL 1.25 MG/3ML IN NEBU: 1.2500 mg | INHALATION_SOLUTION | Freq: Four times a day (QID) | RESPIRATORY_TRACT | 12 refills | Status: AC | PRN

## 2023-10-21 NOTE — Telephone Encounter (Signed)
 Pt request that we mail her a copy of the order placed to [facility] to d/c cpap. Copy has been mailed per patient request.

## 2023-10-21 NOTE — Telephone Encounter (Signed)
 Xopenex  albuterol  solution and HFA has been ordered. Pt is aware and voiced her understanding.  Nothing further needed.

## 2023-11-04 ENCOUNTER — Telehealth: Payer: Self-pay

## 2023-11-04 NOTE — Telephone Encounter (Signed)
 Faxed lab orders to [facility] [phone] on 11/04/2023

## 2023-11-05 NOTE — Telephone Encounter (Signed)
 Faxed [facility] orders [phone] on 11/05/2023

## 2023-11-07 ENCOUNTER — Other Ambulatory Visit: Payer: Self-pay | Admitting: Family Medicine

## 2023-12-13 ENCOUNTER — Telehealth: Payer: Self-pay

## 2023-12-13 NOTE — Telephone Encounter (Signed)
 LVM for patient to c/b and schedule.

## 2023-12-13 NOTE — Telephone Encounter (Signed)
 Please call pt and schedule lab appointment prior to physical 12/30/23.  After appointment is scheduled forward to clinical pool so that we can get orders entered.      >> Dec 13, 2023  9:48 AM Samantha [name] wrote: Patient is requesting to have lab work completed prior to her upcoming physical, she's also wanting A1C + Vitamin D added to the order. Patient is requesting that the fasting lab appointment be scheduled (if possible) for April 18th.

## 2023-12-22 ENCOUNTER — Other Ambulatory Visit: Payer: Self-pay | Admitting: Family Medicine

## 2023-12-22 DIAGNOSIS — E559 Vitamin D deficiency, unspecified: Secondary | ICD-10-CM

## 2023-12-22 DIAGNOSIS — I10 Essential (primary) hypertension: Secondary | ICD-10-CM

## 2023-12-22 NOTE — Progress Notes (Signed)
 Patient ID: Samantha [name], female    DOB: 08/31/1946, 78 y.o.   MRN: [id number]  HPI female never smoker followed for allergic rhinitis, asthma, bronchiectasis,  complicated by GERD/ LPR, Raynaud's, HBP, IBS, Glaucoma , Aortic Atherosclerosis,  Barium swallow 01/09/2016-normal PFT 06/05/2019- Mild restriction, normal flows and Diffusion Lab 04/10/21- EOS wnl, IgE 15 wnl HST 05/09/23- AHI 11.4/hr, desat to 89%, body weight 132 lbs -------------------------------------------------------------------------------------- 05/31/23- 78 year old female never smoker followed for Allergic Rhinitis, Asthma, Bronchiectasis, complicated by GERD/ LPR, Raynaud's, HBP, IBS, Glaucoma -Albuterol  hfa, azelastine  nasal, Nasalcrom , Qnasl , Symbicort  160, singulair , Zyrtec , Protonix , -Carafate , HST 05/09/23- AHI 11.4/hr, desat to 89%, body weight 132 lbs  For treatment decision We reviewed results of her sleep study and discussed management of mild OSA.  She has a dental bridge and probably is not a candidate for an oral appliance so we will try CPAP.  Target complaints are her frequent waking at night and daytime tiredness. She is quite pleased with how well she feels.  Her chest is very clear and she is not coughing. CXR 02/27/23- No active cardiopulmonary disease.  08/26/23- 78 year old female never smoker followed for Allergic Rhinitis, Asthma, Bronchiectasis, complicated by GERD/ LPR, Raynaud's, HBP, IBS, Glaucoma -Albuterol  hfa, azelastine  nasal, Nasalcrom , Qnasl , Symbicort  160, singulair , Zyrtec , Protonix , -Carafate , CPAP auto 5-15/ [facility]    ordered 05/31/23 Download compliance 67%, AHI 2.6/hr Body weight today Discussed the use of AI scribe software for clinical note transcription with the patient, who gave verbal consent to proceed  History of Present Illness   The patient, with a history of mild sleep apnea, presents with dissatisfaction with their CPAP machine. They report discomfort with the nasal pillows  mask and dryness in the nostrils and mouth. The patient also mentions an incident where the machine inflated the pillow over their nose, causing difficulty in breathing. Despite these issues, the patient acknowledges that the machine has improved their sleep duration on some nights.  The patient also reports chest discomfort, particularly in the area around the breastbone. They describe the pain as soreness that is exacerbated by pressure. The patient also mentions shortness of breath, which has been increasing in frequency.  The patient has been experiencing laryngitis since the previous week, which started with a headache. They report chest pain during this period, which they attribute to their heart beating hard. The patient also mentions dry eyes, which they believe are exacerbated by the CPAP machine.  The patient discusses their medications, expressing concern about the number of drugs they are taking. They mention being prescribed diltiazem  by their cardiologist for chest pain, but express confusion about the need for this medication as they are already on two blood pressure medications. They also discuss their inhalers, noting that they have not received some of them despite prescriptions being sent.   Assessment and Plan:    Obstructive Sleep Apnea Mild sleep apnea with poor tolerance to CPAP therapy due to discomfort and dryness. Discussed the importance of consistent use for effective control of apneas. -Consider adjustment of humidifier setting on CPAP machine to alleviate dryness. -Explore alternative mask styles with home care company. -Referral to Dr. Ardell Koller or local dentist for evaluation of suitability for fitted mouthpiece as an alternative to CPAP.  Chest Wall Pain Localized tenderness over the chest wall, likely costochondritis. Not suggestive of cardiac origin. -Consider over-the-counter Voltaren gel for local application. -Continue current pain management with Tylenol as  needed.  Shortness of Breath Reports increased shortness of breath. Last pulmonary  function test was in 2020 and last chest x-ray in June 2024 was normal. -Order updated pulmonary function test.  Asthma Current use of Symbicort . Previous use of Xopenex  inhaler for rescue, but not currently available. -Consider reordering Xopenex  inhaler if Ventolin  is not effective or causes discomfort.  General Health Maintenance -Received flu shot. Discussed optional RSV vaccine. -Current pneumonia vaccination status is adequate with Prevnar 13. No immediate need for Pneumovax 23.    12/24/23-78 year old female never smoker followed for Allergic Rhinitis, Asthma, Bronchiectasis, mild OSA/ failed CPAP, complicated by GERD/ LPR, Raynaud's, HBP, IBS, Glaucoma - azelastine  nasal, Nasalcrom , Qnasl , Symbicort  160, Xopenex  hfa, Neb Xopenex , singulair , Zyrtec , Protonix , -Carafate , CPAP auto 5-15/ [facility]    ordered 05/31/23   D/C'd 08/2023 Body weight today 134 lbs -----Feeling better since first of the year from pneumonia.  Energy and breathing is improved. Discussed the use of AI scribe software for clinical note transcription with the patient, who gave verbal consent to proceed. History of Present Illness   The patient, with a history of mild sleep apnea and fibromyalgia, presents after a recent bout of sinus infection and walking pneumonia. She reports some sensitivity to pollen and dust, and has noticed an increase in the use of her inhaler since her pneumonia. She also mentions some discomfort in her ribs when taking deep breaths, which she attributes to possible arthritis in her spine.  The patient also has a dental bridge and has been experiencing some shifting and gum recession. She is not a good candidate for an oral appliance for OSA.  In addition, the patient has been struggling with congestion in her chest, which she describes as feeling like 'sticky glue.' She has been unable to find her preferred  over-the-counter rhinitis medication, cromolyn , at her local pharmacies in [place]. I suggested she check with her pharmacist..    CXR 10/02/23 MPRESSION: No active cardiopulmonary disease.   Assessment and Plan:    Sinusitis and Walking Pneumonia Chronic Asthmatic Bronchitis mild persistent Recurrent sinusitis and walking pneumonia with improved breathing post-pneumonia. Increased inhaler use post-pneumonia, expected improvement with seasonal change. - Continue current inhaler regimen. - Adjust inhaler use based on symptoms.  Allergic Rhinitis Allergic rhinitis with irritation from pollen and dust. Uses azelastine  solution; previous supply issues with mail order. - Send azelastine  prescription to [facility]. - Provide printed prescription for local pharmacy if needed. -Discus Nasalcrom  availability with her pharmacist  Mild Obstructive Sleep Apnea Mild obstructive sleep apnea with no significant cardiac impact. CPAP not used; dental appliance not suitable due to dental bridge. - No current intervention.  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 Overlapping symptoms complicate differentiation. Stretching exercises like Pilates or physical therapy suggested. - Consider referral to physical therapy for stretching exercises.  Medication Management Requires refills for Xopenex  inhaler, azelastine  solution, and other respiratory medications. Cromolyn  unavailable in inhaler form; nasal spray option discussed. - Send prescriptions for Xopenex  inhaler and azelastine  solution to [facility]. - Provide printed prescription for azelastine  solution. - Advise inquiry about cromolyn  nasal spray at local pharmacies. - Print prescription for cromolyn  nasal spray for local pharmacy.  Follow-up Follow-up scheduled in six months, adjustable as needed. - Schedule follow-up appointment in six months.           Review of Systems-see HPI   + =  positive Constitutional:   No weight loss, night sweats,  Fevers, chills, fatigue, lassitude. HEENT:   No headaches,  Difficulty swallowing,  Tooth/dental problems,  Sore throat,  No sneezing, itching, or ear ache,   nasal congestion, post nasal drip, CV:  + chest pain, orthopnea, PND, swelling in lower extremities, anasarca, dizziness, palpitations GI  No heartburn, indigestion, abdominal pain, nausea, vomiting,  Resp: No shortness of breath with exertion or at rest.  No excess mucus, +productive cough,  +-non-productive cough,  No coughing up of blood.  No change in color of mucus.  +infrequent wheezing.   Skin: Clear GU:  MS:  + joint pain or swelling.   Psych:  No change in mood or affect. No depression or anxiety.  No memory loss.   Objective:   Physical Exam General- Alert, Oriented, Affect-appropriate, Distress- none acute; pleasant  Skin- clear Lymphadenopathy- none Head- atraumatic            Eyes- Gross vision intact, PERRLA, conjunctivae clear secretions,            Ears- Normal hearing            Nose- clear, no-Septal dev, mucus, polyps, erosion, perforation .                        Throat- Malampatti III.   TMs not  retracted , mucosa not red, drainage- none, tonsils- atrophic;  +dental bridge, Neck- flexible , trachea midline, no stridor , thyroid  nl, carotid no bruit Chest - symmetrical excursion , unlabo           Heart/CV- RRR , no murmur , no gallop  , no rub, nl s1 s2                           - JVD- none , edema-none, stasis changes- none, varices- none           Lung-  +crackles, cough- , dullness-none, rub- none, crackles- none           Chest wall-  Abd-  Br/ Gen/ Rectal- Not done, not indicated Extrem- cyanosis- none, clubbing, none, atrophy- none, strength- nl.  Neuro- grossly intact to observation

## 2023-12-23 ENCOUNTER — Ambulatory Visit: Payer: Medicare PPO

## 2023-12-23 VITALS — BP 130/70 | Ht 61.5 in | Wt 133.0 lb

## 2023-12-23 DIAGNOSIS — Z0000 Encounter for general adult medical examination without abnormal findings: Secondary | ICD-10-CM | POA: Diagnosis not present

## 2023-12-23 DIAGNOSIS — Z2821 Immunization not carried out because of patient refusal: Secondary | ICD-10-CM

## 2023-12-23 NOTE — Progress Notes (Signed)
 Subjective:   Samantha [name] is a 78 y.o. who presents for a Medicare Wellness preventive visit.  Visit Complete: Virtual I connected with  Samantha [name] on 12/23/23 by a audio enabled telemedicine application and verified that I am speaking with the correct person using two identifiers.  Patient Location: Home  Provider Location: Home Office  I discussed the limitations of evaluation and management by telemedicine. The patient expressed understanding and agreed to proceed.  Vital Signs: Because this visit was a virtual/telehealth visit, some criteria may be missing or patient reported. Any vitals not documented were not able to be obtained and vitals that have been documented are patient reported.  VideoDeclined- This patient declined Librarian, academic. Therefore the visit was completed with audio only.  Persons Participating in Visit: Patient.  AWV Questionnaire: No: Patient Medicare AWV questionnaire was not completed prior to this visit.  Cardiac Risk Factors include: advanced age (>75men, >17 women)     Objective:    Today's Vitals   12/23/23 1134  BP: 130/70  Weight: 133 lb (60.3 kg)  Height: 5' 1.5" (1.562 m)   Body mass index is 24.72 kg/m.     12/23/2023   11:33 AM 12/19/2022   10:54 AM 02/26/2022    1:00 PM 07/12/2020    2:09 PM 05/23/2020    9:13 AM 07/13/2019   10:36 AM 03/10/2019    1:04 PM  Advanced Directives  Does Patient Have a Medical Advance Directive? Yes No No No Yes No Yes  Type of Sales promotion account executive of [employer];Living will     Healthcare Power of Attorney  Does patient want to make changes to medical advance directive? No - Patient declined    No - Patient declined    Copy of Healthcare Power of Attorney in Chart? Yes - validated most recent copy scanned in chart (See row information) No - copy requested       Would patient like information on creating a medical advance  directive?   No - Patient declined No - Patient declined  Yes (MAU/Ambulatory/Procedural Areas - Information given)     Current Medications (verified) Outpatient Encounter Medications as of 12/23/2023  Medication Sig   Azelastine HCl 0.15 % SOLN 2 puffs each nostril twice daily as needed   betamethasone, augmented, (DIPROLENE) 0.05 % lotion as needed (itching).   bifidobacterium infantis (ALIGN) capsule Take 1 capsule by mouth daily.   budesonide-formoterol (SYMBICORT) 160-4.5 MCG/ACT inhaler Inhale 2 puffs then rinse mouth, twice daily   cetirizine (ZYRTEC) 10 MG tablet Take 1 tablet (10 mg total) by mouth at bedtime.   Cholecalciferol (VITAMIN D) 50 MCG (2000 UT) CAPS Take 1 capsule by mouth daily.   Coenzyme Q10 (CO Q 10) 60 MG CAPS 1 capsule with a meal   cromolyn (NASALCROM) 5.2 MG/ACT nasal spray Place 1 spray into both nostrils 2 (two) times daily as needed for allergies.   diltiazem (CARDIZEM) 30 MG tablet May take every 8 hours as needed for chest discomfort   hydrochlorothiazide (HYDRODIURIL) 12.5 MG tablet TAKE 1 TABLET BY MOUTH DAILY   levalbuterol (XOPENEX HFA) 45 MCG/ACT inhaler Inhale 1-2 puffs into the lungs every 6 (six) hours as needed for wheezing.   levalbuterol (XOPENEX) 1.25 MG/3ML nebulizer solution Take 1.25 mg by nebulization every 6 (six) hours as needed for wheezing.   losartan (COZAAR) 50 MG tablet Take 1 tablet (50 mg total) by mouth daily.   losartan (COZAAR)  50 MG tablet TAKE 1 TABLET BY MOUTH EVERY DAY   Magnesium Malate POWD (1250mg  tabs) 1 tablet by mouth qhs   Manganese 50 MG TABS Take 50 mg by mouth at bedtime.    mometasone (ELOCON) 0.1 % lotion as needed (otc irritation).   montelukast (SINGULAIR) 10 MG tablet 1 daily   Olopatadine HCl (PATADAY) 0.2 % SOLN Place 1 drop into both eyes daily as needed (itchy eyes).   pantoprazole (PROTONIX) 40 MG tablet Take 1 tablet (40 mg total) by mouth 2 (two) times daily. DX K21.9 and K58.9   Polyethyl Glycol-Propyl  Glycol (SYSTANE) 0.4-0.3 % GEL ophthalmic gel Place 1 application into both eyes every 6 (six) hours as needed (dry eyes).   TART CHERRY PO Take by mouth. Take 1 tablet one time a day by mouth   thiamine (VITAMIN B-1) 50 MG tablet Take 100 mg by mouth daily.   TURMERIC PO Take 1 tablet by mouth daily.   amoxicillin-clavulanate (AUGMENTIN) 875-125 MG tablet Take 1 tablet by mouth 2 (two) times daily. (Patient not taking: Reported on 12/23/2023)   predniSONE (DELTASONE) 20 MG tablet Take 2 tablets (40 mg total) by mouth daily. For 3 days, then 1 tab daily for 3 days (Patient not taking: Reported on 12/23/2023)   No facility-administered encounter medications on file as of 12/23/2023.    Allergies (verified) 2,4-d dimethylamine; Albuterol; Cefuroxime axetil; Doxycycline; Iodinated contrast media; Latex; Lisinopril; Lovastatin; and Sulfonamide derivatives   History: Past Medical History:  Diagnosis Date   ALLERGIC RHINITIS 07/26/2008   ASTHMA 04/14/2007   ASYMPTOMATIC POSTMENOPAUSAL STATUS 05/10/2008   Bronchiectasis (HCC)    CHEST PAIN 08/26/2008   COLONIC POLYPS, HX OF 04/14/2007   colonic leiomyoma   Decreased grip strength    Episcleritis    FIBROIDS, UTERUS 05/10/2008   Fibromyalgia    Gait abnormality    GERD 04/14/2007   HYPERCHOLESTEROLEMIA 01/07/2008   HYPERGLYCEMIA 11/28/2009   HYPERTENSION 01/28/2008   INSOMNIA 05/10/2008   Irritable bowel syndrome 04/06/2010   OSTEOARTHRITIS 11/28/2009   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   Past Surgical History:  Procedure Laterality Date   COLONOSCOPY W/ POLYPECTOMY     ELECTROCARDIOGRAM  12/04/2006   IR RADIOLOGY PERIPHERAL GUIDED IV START  04/21/2018   IR US GUIDE VASC ACCESS LEFT  04/21/2018   NASAL SEPTUM SURGERY     Stress Cardiolite  02/13/2002   Family History  Problem Relation Age of Onset   Diabetes Mother    Hypertension Mother    Glaucoma Mother    Polymyositis Mother    Heart disease Father        CHF   Allergic  rhinitis Father    Asthma Father    Other Father        sideoblastic anemia   Allergic rhinitis Sister    Asthma Sister    Allergic rhinitis Maternal Aunt    Asthma Maternal Aunt    Allergic rhinitis Paternal Aunt    Asthma Paternal Aunt    Colon cancer Cousin    Allergic rhinitis Brother    Asthma Brother    Prostate cancer Brother    Sleep apnea Brother    Cancer Neg Hx        No FH of Colon Cancer   Angioedema Neg Hx    Atopy Neg Hx    Immunodeficiency Neg Hx    Urticaria Neg Hx    Eczema Neg Hx    Social History   Socioeconomic History   Marital  status: Widowed    Spouse name: Not on file   Number of children: 3   Years of education: some college   Highest education level: Not on file  Occupational History    Employer: RETIRED    Comment: Retired Journalist, newspaper)  Tobacco Use   Smoking status: Never    Passive exposure: Never   Smokeless tobacco: Never  Vaping Use   Vaping status: Never Used  Substance and Sexual Activity   Alcohol use: No   Drug use: No   Sexual activity: Never  Other Topics Concern   Not on file  Social History Narrative   Lives alone (widowed 1998).   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      2 sons and 1 daughter.        Right-handed.   No daily caffeine use.   Social Drivers of Corporate investment banker Strain: Low Risk  (12/23/2023)   Overall Financial Resource Strain (CARDIA)    Difficulty of Paying Living Expenses: Not hard at all  Food Insecurity: No Food Insecurity (12/23/2023)   Hunger Vital Sign    Worried About Running Out of Food in the Last Year: Never true    Ran Out of Food in the Last Year: Never true  Transportation Needs: No Transportation Needs (12/23/2023)   PRAPARE - Administrator, Civil Service (Medical): No    Lack of Transportation (Non-Medical): No  Physical Activity: Inactive (12/23/2023)   Exercise Vital Sign    Days of Exercise per Week: 0 days    Minutes of Exercise per Session:  0 min  Stress: No Stress Concern Present (12/23/2023)   [employer] of Occupational Health - Occupational Stress Questionnaire    Feeling of Stress : Not at all  Social Connections: Moderately Isolated (12/23/2023)   Social Connection and Isolation Panel [NHANES]    Frequency of Communication with Friends and Family: More than three times a week    Frequency of Social Gatherings with Friends and Family: Twice a week    Attends Religious Services: More than 4 times per year    Active Member of [employer] or Organizations: No    Attends Banker Meetings: Never    Marital Status: Widowed    Tobacco Counseling Counseling given: Not Answered    Clinical Intake:  Pre-visit preparation completed: Yes  Pain : No/denies pain     BMI - recorded: 24.72 Nutritional Status: BMI of 19-24  Normal Nutritional Risks: None Diabetes: No  Lab Results  Component Value Date   HGBA1C 6.1 (H) 11/08/2022   HGBA1C 5.9 07/09/2018   HGBA1C 6.1 07/03/2017     How often do you need to have someone help you when you read instructions, pamphlets, or other written materials from your doctor or pharmacy?: 1 - Never  Interpreter Needed?: No  Information entered by :: Juliann Ochoa   Activities of Daily Living     12/23/2023   11:43 AM  In your present state of health, do you have any difficulty performing the following activities:  Hearing? 0  Vision? 0  Difficulty concentrating or making decisions? 0  Walking or climbing stairs? 1  Comment yes when going up stairs  Dressing or bathing? 0  Doing errands, shopping? 0  Preparing Food and eating ? N  Using the Toilet? N  In the past six months, have you accidently leaked urine? N  Do you have problems with loss of bowel control?  Y  Managing your Medications? N  Managing your Finances? N  Housekeeping or managing your Housekeeping? N    Patient Care Team: Joaquim Nam, MD as PCP - General (Family  Medicine) Christell Constant, MD as PCP - Cardiology (Cardiology) Waymon Budge, MD (Pulmonary Disease) Meryl Dare, MD (Inactive) (Gastroenterology) Pollyann Savoy, MD (Rheumatology) Richardean Chimera, MD as Consulting Physician (Obstetrics and Gynecology) Lockie Mola, MD as Referring Physician (Ophthalmology) Suzanna Obey, MD as Consulting Physician (Otolaryngology) Marcene Corning, MD as Consulting Physician (Orthopedic Surgery) Cherlyn Roberts, MD as Consulting Physician (Dermatology) Adolphus Birchwood, MD as Consulting Physician (Obstetrics and Gynecology) Kathi Der, MD as Consulting Physician (Gastroenterology) Richardean Chimera, MD as Consulting Physician (Obstetrics and Gynecology) Lucie Leather Alvira Philips, MD as Consulting Physician (Allergy and Immunology)  Indicate any recent Medical Services you may have received from other than [facility] providers in the past year (date may be approximate).     Assessment:   This is a routine wellness examination for Samantha [name].  Hearing/Vision screen Hearing Screening - Comments:: No hearing issues  Vision Screening - Comments:: Wears glasses    Goals Addressed             This Visit's Progress    Patient Stated       Patient would like to exercise more and continue to pray        Depression Screen     12/23/2023   11:46 AM 12/25/2022   10:50 AM 12/19/2022   10:54 AM 12/01/2021    2:29 PM 07/12/2020    2:12 PM 07/13/2019   10:38 AM 07/09/2018   10:07 AM  PHQ 2/9 Scores  PHQ - 2 Score 3 0 0 0 0 0 0  PHQ- 9 Score 6 6   0 0 0    Fall Risk     12/23/2023   11:41 AM 12/25/2022   10:49 AM 12/19/2022   10:56 AM 12/01/2021    2:29 PM 07/12/2020    2:11 PM  Fall Risk   Falls in the past year? 0 0 0 0 0  Number falls in past yr: 0 0 0 0 0  Injury with Fall? 0 0 0 0 0  Risk for fall due to : No Fall Risks No Fall Risks No Fall Risks No Fall Risks Medication side effect  Follow up Falls prevention discussed;Falls evaluation  completed Falls evaluation completed Falls prevention discussed;Falls evaluation completed Falls evaluation completed Falls evaluation completed;Falls prevention discussed    MEDICARE RISK AT HOME:  Medicare Risk at Home Any stairs in or around the home?: No If so, are there any without handrails?: No Home free of loose throw rugs in walkways, pet beds, electrical cords, etc?: Yes Adequate lighting in your home to reduce risk of falls?: Yes Life alert?: No Use of a cane, walker or w/c?: No Grab bars in the bathroom?: Yes Shower chair or bench in shower?: Yes Elevated toilet seat or a handicapped toilet?: Yes  TIMED UP AND GO:  Was the test performed?  No  Cognitive Function: 6CIT completed    07/12/2020    2:15 PM 07/13/2019   10:45 AM 07/09/2018   10:07 AM 07/03/2017   12:58 PM 06/27/2016   10:53 AM  MMSE - Mini Mental State Exam  Orientation to time 5 5 5 5 5   Orientation to Place 5 5 5 5 5   Registration 3 3 3 3 3   Attention/ Calculation 5 5 0 0 0  Recall 2 3 3  3 3  Language- name 2 objects   0 0 0  Language- repeat 1 1 1 1 1   Language- follow 3 step command   3 3 3   Language- read & follow direction   0 0 0  Write a sentence   0 0 0  Copy design   0 0 0  Total score   20 20 20       11/08/2022   10:33 AM 05/07/2022    3:00 PM  Montreal Cognitive Assessment   Visuospatial/ Executive (0/5) 3 4  Naming (0/3) 3 3  Attention: Read list of digits (0/2) 1 2  Attention: Read list of letters (0/1) 1 1  Attention: Serial 7 subtraction starting at 100 (0/3) 0 1  Language: Repeat phrase (0/2) 2 2  Language : Fluency (0/1) 0 0  Abstraction (0/2) 2 2  Delayed Recall (0/5) 4 2  Orientation (0/6) 6 6  Total 22 23      12/23/2023   11:37 AM 12/19/2022   10:57 AM  6CIT Screen  What Year? 0 points 0 points  What month? 0 points 0 points  What time? 0 points 0 points  Count back from 20 0 points 0 points  Months in reverse 0 points 4 points  Repeat phrase 0 points 2 points   Total Score 0 points 6 points    Immunizations Immunization History  Administered Date(s) Administered   Fluad Quad(high Dose 65+) 05/07/2019, 05/24/2020, 05/19/2021, 05/15/2022   Fluad Trivalent(High Dose 65+) 05/06/2023   Influenza Split 06/04/2011, 05/27/2012   Influenza Whole 08/18/2008, 05/26/2010   Influenza, High Dose Seasonal PF 05/22/2017, 06/11/2018   Influenza,inj,Quad PF,6+ Mos 06/08/2013, 05/19/2014, 06/08/2015, 05/07/2016   Influenza-Unspecified 05/17/2020   PFIZER(Purple Top)SARS-COV-2 Vaccination 10/27/2019, 11/17/2019, 10/11/2020   Pfizer Covid-19 Vaccine Bivalent Booster 50yrs & up 12/21/2021   Pneumococcal Conjugate-13 03/17/2014   Pneumococcal Polysaccharide-23 11/23/2009, 06/08/2015, 07/14/2018   Td 11/09/2002   Tdap 07/03/2013   Zoster Recombinant(Shingrix) 02/19/2019, 08/27/2019   Zoster, Live 06/17/2013    Screening Tests Health Maintenance  Topic Date Due   COVID-19 Vaccine (5 - 2024-25 season) 05/12/2023   DTaP/Tdap/Td (3 - Td or Tdap) 07/04/2023   INFLUENZA VACCINE  04/10/2024   Medicare Annual Wellness (AWV)  12/22/2024   Colonoscopy  01/14/2025   Pneumonia Vaccine 53+ Years old  Completed   DEXA SCAN  Completed   Hepatitis C Screening  Completed   Zoster Vaccines- Shingrix  Completed   HPV VACCINES  Aged Out   Meningococcal B Vaccine  Aged Out    Health Maintenance  Health Maintenance Due  Topic Date Due   COVID-19 Vaccine (5 - 2024-25 season) 05/12/2023   DTaP/Tdap/Td (3 - Td or Tdap) 07/04/2023   Health Maintenance Items Addressed:   Additional Screening:  Vision Screening: Recommended annual ophthalmology exams for early detection of glaucoma and other disorders of the eye.  Dental Screening: Recommended annual dental exams for proper oral hygiene  Community Resource Referral / Chronic Care Management: CRR required this visit?  No   CCM required this visit?  No     Plan:     I have personally reviewed and noted the  following in the patient's chart:   Medical and social history Use of alcohol, tobacco or illicit drugs  Current medications and supplements including opioid prescriptions. Patient is not currently taking opioid prescriptions. Functional ability and status Nutritional status Physical activity Advanced directives List of other physicians Hospitalizations, surgeries, and ER visits in previous 12  months Vitals Screenings to include cognitive, depression, and falls Referrals and appointments  In addition, I have reviewed and discussed with patient certain preventive protocols, quality metrics, and best practice recommendations. A written personalized care plan for preventive services as well as general preventive health recommendations were provided to patient.     Freeda Jerry, [place]   12/23/2023   After Visit Summary: (MyChart) Due to this being a telephonic visit, the after visit summary with patients personalized plan was offered to patient via MyChart   Notes: Nothing significant to report at this time.

## 2023-12-23 NOTE — Patient Instructions (Signed)
 [name] , Thank you for taking time to come for your Medicare Wellness Visit. I appreciate your ongoing commitment to your health goals. Please review the following plan we discussed and let me know if I can assist you in the future.   Referrals/Orders/Follow-Ups/Clinician Recommendations: follow up in 1 year for next medicare annual wellness  This is a list of the screening recommended for you and due dates:  Health Maintenance  Topic Date Due   COVID-19 Vaccine (5 - 2024-25 season) 05/12/2023   DTaP/Tdap/Td vaccine (3 - Td or Tdap) 07/04/2023   Flu Shot  04/10/2024   Medicare Annual Wellness Visit  12/22/2024   Colon Cancer Screening  01/14/2025   Pneumonia Vaccine  Completed   DEXA scan (bone density measurement)  Completed   Hepatitis C Screening  Completed   Zoster (Shingles) Vaccine  Completed   HPV Vaccine  Aged Out   Meningitis B Vaccine  Aged Out    Advanced directives: (Declined) Advance directive discussed with you today. Even though you declined this today, please call our office should you change your mind, and we can give you the proper paperwork for you to fill out.  Next Medicare Annual Wellness Visit scheduled for next year: yes

## 2023-12-24 ENCOUNTER — Encounter: Payer: Self-pay | Admitting: Internal Medicine

## 2023-12-24 ENCOUNTER — Ambulatory Visit (INDEPENDENT_AMBULATORY_CARE_PROVIDER_SITE_OTHER): Payer: Medicare PPO | Admitting: Internal Medicine

## 2023-12-24 VITALS — BP 122/58 | HR 67 | Temp 97.8°F | Ht 62.0 in | Wt 134.4 lb

## 2023-12-24 DIAGNOSIS — J4530 Mild persistent asthma, uncomplicated: Secondary | ICD-10-CM

## 2023-12-24 DIAGNOSIS — G4733 Obstructive sleep apnea (adult) (pediatric): Secondary | ICD-10-CM

## 2023-12-24 MED ORDER — CROMOLYN SODIUM 5.2 MG/ACT NA AERS
1.0000 | INHALATION_SPRAY | Freq: Four times a day (QID) | NASAL | 12 refills | Status: AC
Start: 2023-12-24 — End: ?

## 2023-12-24 MED ORDER — AZELASTINE HCL 0.15 % NA SOLN: NASAL | 3 refills | Status: DC

## 2023-12-24 MED ORDER — AZELASTINE HCL 0.15 % NA SOLN: NASAL | 3 refills | Status: AC

## 2023-12-24 MED ORDER — LEVALBUTEROL TARTRATE 45 MCG/ACT IN AERO: 1.0000 | INHALATION_SPRAY | Freq: Four times a day (QID) | RESPIRATORY_TRACT | 3 refills | Status: AC | PRN

## 2023-12-24 MED ORDER — BUDESONIDE-FORMOTEROL FUMARATE 160-4.5 MCG/ACT IN AERO: INHALATION_SPRAY | RESPIRATORY_TRACT | 4 refills | Status: AC

## 2023-12-24 NOTE — Patient Instructions (Signed)
 Refills sent  Also scripts printed for azelastine and nasalcrom

## 2023-12-25 ENCOUNTER — Other Ambulatory Visit (INDEPENDENT_AMBULATORY_CARE_PROVIDER_SITE_OTHER)

## 2023-12-25 DIAGNOSIS — E559 Vitamin D deficiency, unspecified: Secondary | ICD-10-CM

## 2023-12-25 DIAGNOSIS — I10 Essential (primary) hypertension: Secondary | ICD-10-CM | POA: Diagnosis not present

## 2023-12-25 LAB — COMPREHENSIVE METABOLIC PANEL WITH GFR
ALT: 9 U/L (ref 0–35)
AST: 17 U/L (ref 0–37)
Albumin: 4.6 g/dL (ref 3.5–5.2)
Alkaline Phosphatase: 70 U/L (ref 39–117)
BUN: 18 mg/dL (ref 6–23)
CO2: 30 meq/L (ref 19–32)
Calcium: 10 mg/dL (ref 8.4–10.5)
Chloride: 102 meq/L (ref 96–112)
Creatinine, Ser: 0.93 mg/dL (ref 0.40–1.20)
GFR: 58.93 mL/min — ABNORMAL LOW (ref >60.00)
Glucose, Bld: 88 mg/dL (ref 70–99)
Potassium: 3.8 meq/L (ref 3.5–5.1)
Sodium: 139 meq/L (ref 135–145)
Total Bilirubin: 0.6 mg/dL (ref 0.2–1.2)
Total Protein: 6.8 g/dL (ref 6.0–8.3)

## 2023-12-25 LAB — CBC WITH DIFFERENTIAL/PLATELET
Basophils Absolute: 0 10*3/uL (ref 0.0–0.1)
Basophils Relative: 0.4 % (ref 0.0–3.0)
Eosinophils Absolute: 0.1 10*3/uL (ref 0.0–0.7)
Eosinophils Relative: 1 % (ref 0.0–5.0)
HCT: 39.6 % (ref 36.0–46.0)
Hemoglobin: 13.5 g/dL (ref 12.0–15.0)
Lymphocytes Relative: 38.3 % (ref 12.0–46.0)
Lymphs Abs: 2 10*3/uL (ref 0.7–4.0)
MCHC: 34 g/dL (ref 30.0–36.0)
MCV: 86.4 fl (ref 78.0–100.0)
Monocytes Absolute: 0.4 10*3/uL (ref 0.1–1.0)
Monocytes Relative: 6.9 % (ref 3.0–12.0)
Neutro Abs: 2.8 10*3/uL (ref 1.4–7.7)
Neutrophils Relative %: 53.4 % (ref 43.0–77.0)
Platelets: 197 10*3/uL (ref 150.0–400.0)
RBC: 4.59 Mil/uL (ref 3.87–5.11)
RDW: 14 % (ref 11.5–15.5)
WBC: 5.3 10*3/uL (ref 4.0–10.5)

## 2023-12-25 LAB — HEMOGLOBIN A1C: Hgb A1c MFr Bld: 6 % (ref 4.6–6.5)

## 2023-12-25 LAB — LIPID PANEL
Cholesterol: 223 mg/dL — ABNORMAL HIGH (ref 0–200)
HDL: 103.8 mg/dL (ref >39.00)
LDL Cholesterol: 104 mg/dL — ABNORMAL HIGH (ref 0–99)
NonHDL: 118.97
Total CHOL/HDL Ratio: 2
Triglycerides: 74 mg/dL (ref 0.0–149.0)
VLDL: 14.8 mg/dL (ref 0.0–40.0)

## 2023-12-25 LAB — VITAMIN D 25 HYDROXY (VIT D DEFICIENCY, FRACTURES): VITD: 46.5 ng/mL (ref 30.00–100.00)

## 2023-12-25 LAB — TSH: TSH: 3.51 u[IU]/mL (ref 0.35–5.50)

## 2023-12-26 ENCOUNTER — Other Ambulatory Visit

## 2023-12-30 ENCOUNTER — Encounter: Payer: Self-pay | Admitting: Family Medicine

## 2023-12-30 ENCOUNTER — Ambulatory Visit (INDEPENDENT_AMBULATORY_CARE_PROVIDER_SITE_OTHER): Admitting: Family Medicine

## 2023-12-30 VITALS — BP 132/68 | HR 61 | Temp 97.9°F | Ht 62.0 in | Wt 135.0 lb

## 2023-12-30 DIAGNOSIS — Z0000 Encounter for general adult medical examination without abnormal findings: Secondary | ICD-10-CM

## 2023-12-30 DIAGNOSIS — I10 Essential (primary) hypertension: Secondary | ICD-10-CM | POA: Diagnosis not present

## 2023-12-30 DIAGNOSIS — J479 Bronchiectasis, uncomplicated: Secondary | ICD-10-CM | POA: Diagnosis not present

## 2023-12-30 MED ORDER — LOSARTAN POTASSIUM 50 MG PO TABS: 50.0000 mg | ORAL_TABLET | Freq: Every day | ORAL | 3 refills | Status: AC

## 2023-12-30 MED ORDER — HYDROCHLOROTHIAZIDE 12.5 MG PO TABS: ORAL_TABLET | ORAL | 3 refills | Status: DC

## 2023-12-30 NOTE — Progress Notes (Signed)
 Hypertension:    Using medication without problems or lightheadedness: yes Chest pain with exertion: not clearly with exertion, can happen at rest.  Improved with prn diltiazem  use.   Edema:some occ BLE edema but not today.  Short of breath: see below.    She has been using prep H for hemorrhoids and is going to see GI tomorrow. Prev rectal pain is resolved.    Asthma per outside clinic.  She couldn't tolerate CPAP.  She saw pulmonary about that, Dr. Linder Revere.  D/w pt about potentially updating PFTs.  I need his input.  She had more SABA use since prior infection this year.   She had covid vaccine.  Cautions d/w pt.  Other vaccines d/w pt, ie tetanus.   Colon 2021- she is going to see GI on 12/31/23.   Gyn exam per Dr. McComb.   Mammogram and DXA per Dr. Cloretta Danes.   Meds, vitals, and allergies reviewed.   PMH and SH reviewed  ROS: Per HPI unless specifically indicated in ROS section   GEN: nad, alert and oriented HEENT: ncat NECK: supple w/o LA CV: rrr. PULM: ctab, no inc wob ABD: soft, +bs EXT: no edema SKIN: well perfused.

## 2023-12-30 NOTE — Patient Instructions (Signed)
 Please check with Dr. Cloretta Danes about getting your follow up bone density scan.  Take care.  Glad to see you.

## 2023-12-31 ENCOUNTER — Other Ambulatory Visit: Payer: Self-pay | Admitting: Family Medicine

## 2023-12-31 ENCOUNTER — Telehealth: Payer: Self-pay

## 2023-12-31 DIAGNOSIS — K5900 Constipation, unspecified: Secondary | ICD-10-CM | POA: Diagnosis not present

## 2023-12-31 DIAGNOSIS — K219 Gastro-esophageal reflux disease without esophagitis: Secondary | ICD-10-CM | POA: Diagnosis not present

## 2023-12-31 DIAGNOSIS — R109 Unspecified abdominal pain: Secondary | ICD-10-CM | POA: Diagnosis not present

## 2023-12-31 DIAGNOSIS — K625 Hemorrhage of anus and rectum: Secondary | ICD-10-CM | POA: Diagnosis not present

## 2023-12-31 DIAGNOSIS — N644 Mastodynia: Secondary | ICD-10-CM

## 2023-12-31 DIAGNOSIS — L290 Pruritus ani: Secondary | ICD-10-CM | POA: Diagnosis not present

## 2023-12-31 DIAGNOSIS — Z1231 Encounter for screening mammogram for malignant neoplasm of breast: Secondary | ICD-10-CM

## 2023-12-31 NOTE — Telephone Encounter (Signed)
 Copied from CRM [id number]. Topic: Referral - Request for Referral >> Dec 31, 2023  9:00 AM Zipporah Him wrote: Did the patient discuss referral with their provider in the last year? Yes   Appointment offered? Yes  Type of order/referral and detailed reason for visit:   3D Diagnostic Mammogram and Ultrasound  Preference of office, provider, location: [facility] [phone]  If referral order, have you been seen by this specialty before? Yes  Can we respond through MyChart? Yes

## 2024-01-01 NOTE — Addendum Note (Signed)
 Addended by: Donnie Galea on: 01/01/2024 02:57 PM   Modules accepted: Orders

## 2024-01-01 NOTE — Assessment & Plan Note (Signed)
 Continue hydrochlorothiazide  and losartan  with diltiazem  as needed.

## 2024-01-01 NOTE — Telephone Encounter (Addendum)
 Please check with patient.  The mammogram order states "PT MENTIONED BILAT BREAST ITCHING, BURNING, TENDERNESS, & LUMPS; LT BREAST PAIN - REFERRED TO PCP FOR DIAGNOSTIC MM/US  ".   I don't recall her mentioning this at the OV recently.  Please see about her status and let me know.  Thanks.

## 2024-01-01 NOTE — Telephone Encounter (Signed)
 Noted. Thanks.  Signed.

## 2024-01-01 NOTE — Assessment & Plan Note (Signed)
 I am asking for input from pulmonary about potentially updating her PFTs.

## 2024-01-01 NOTE — Assessment & Plan Note (Signed)
  She had covid vaccine.  Cautions d/w pt.  Other vaccines d/w pt, ie tetanus.   Colon 2021- she is going to see GI on 12/31/23.   Gyn exam per Dr. McComb.   Mammogram 2024 DXA per Dr. Cloretta Danes.

## 2024-01-01 NOTE — Telephone Encounter (Signed)
 Spoke with patient and she advised that she has been experiencing for a few months some burning, itching and tenderness in both breast. She did not say it was a lump but in the past she was told that she had a small cyst. The pain she experiences is mostly in the left breast. Therefore she needs a referral for a diagnostic mammogram or ultrasound.

## 2024-01-08 ENCOUNTER — Encounter: Payer: Self-pay | Admitting: Internal Medicine

## 2024-01-15 NOTE — Progress Notes (Deleted)
 Office Visit Note  Patient: Samantha [name]             Date of Birth: 1946-06-28           MRN: [id number]             PCP: Donnie Galea, MD Referring: Donnie Galea, MD Visit Date: 01/29/2024 Occupation: @GUAROCC @  Subjective:  No chief complaint on file.   History of Present Illness: Samantha [name] is a 78 y.o. female ***     Activities of Daily Living:  Patient reports morning stiffness for *** {minute/hour:19697}.   Patient {ACTIONS;DENIES/REPORTS:21021675::"Denies"} nocturnal pain.  Difficulty dressing/grooming: {ACTIONS;DENIES/REPORTS:21021675::"Denies"} Difficulty climbing stairs: {ACTIONS;DENIES/REPORTS:21021675::"Denies"} Difficulty getting out of chair: {ACTIONS;DENIES/REPORTS:21021675::"Denies"} Difficulty using hands for taps, buttons, cutlery, and/or writing: {ACTIONS;DENIES/REPORTS:21021675::"Denies"}  No Rheumatology ROS completed.   PMFS History:  Patient Active Problem List   Diagnosis Date Noted   SOB (shortness of breath) 09/26/2023   Complaints of total body pain 06/05/2023   OSA (obstructive sleep apnea) 05/31/2023   Non-recurrent acute serous otitis media of right ear 02/27/2023   Medication refill 02/27/2023   Snoring 01/03/2023   Acute non-recurrent maxillary sinusitis 07/19/2022   Cognitive impairment 05/07/2022   Other fatigue 05/07/2022   Medicare annual wellness visit, subsequent 12/04/2021   Fatigue 01/08/2021   DNR (do not resuscitate) 07/24/2020   Tinea versicolor 05/30/2020   Myalgia 12/24/2019   Bronchiectasis without complication (HCC) 08/27/2019   Dysphagia 07/23/2019   Nonintractable headache 03/19/2019   Paresthesia 03/19/2019   Decreased grip strength 02/04/2019   Local superficial swelling 02/02/2019   Weight loss 02/02/2019   Osteopenia 08/09/2018   Chronic rhinitis 07/23/2018   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   Healthcare maintenance 07/16/2018   Chest discomfort 01/30/2018   Moderate  persistent asthma with acute exacerbation 01/30/2018   Advance care planning 01/02/2018   Vitamin D  deficiency 07/08/2017   SI joint arthritis (HCC) 06/19/2017   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/19/2017   Primary osteoarthritis of both knees 01/19/2017   Primary osteoarthritis of both feet 01/19/2017   Deviated septum 12/07/2016   Dyspnea on exertion 11/14/2016   Diverticulosis 03/19/2016   Seasonal and perennial allergic rhinitis 01/18/2016   Lumbar compression fracture (HCC) 06/20/2015   Fibromyalgia 02/26/2013   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   IRRITABLE BOWEL SYNDROME 04/06/2010   THYROID  NODULE, RIGHT 12/07/2009   OSTEOARTHRITIS 11/28/2009   Chest pain of uncertain etiology 08/26/2008   FIBROIDS, UTERUS 05/10/2008   Insomnia 05/10/2008   ASYMPTOMATIC POSTMENOPAUSAL STATUS 05/10/2008   Essential hypertension 01/28/2008   HYPERCHOLESTEROLEMIA 01/07/2008   GLAUCOMA 01/07/2008   Gastroesophageal reflux disease 04/14/2007   Asthma 04/14/2007    Past Medical History:  Diagnosis Date   ALLERGIC RHINITIS 07/26/2008   ASTHMA 04/14/2007   ASYMPTOMATIC POSTMENOPAUSAL STATUS 05/10/2008   Bronchiectasis (HCC)    CHEST PAIN 08/26/2008   COLONIC POLYPS, HX OF 04/14/2007   colonic leiomyoma   Decreased grip strength    Episcleritis    FIBROIDS, UTERUS 05/10/2008   Fibromyalgia    Gait abnormality    GERD 04/14/2007   HYPERCHOLESTEROLEMIA 01/07/2008   HYPERGLYCEMIA 11/28/2009   HYPERTENSION 01/28/2008   INSOMNIA 05/10/2008   Irritable bowel syndrome 04/06/2010   OSTEOARTHRITIS 11/28/2009   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    Family History  Problem Relation Age of Onset   Diabetes Mother    Hypertension Mother    Glaucoma Mother    Polymyositis Mother    Heart disease Father  CHF   Allergic rhinitis Father    Asthma Father    Other Father        sideoblastic anemia   Allergic rhinitis Sister    Asthma Sister    Allergic rhinitis Brother    Asthma Brother     Prostate cancer Brother    Sleep apnea Brother    Allergic rhinitis Maternal Aunt    Asthma Maternal Aunt    Allergic rhinitis Paternal Aunt    Asthma Paternal Aunt    Colon cancer Cousin    Breast cancer Cousin    Cancer Neg Hx        No FH of Colon Cancer   Angioedema Neg Hx    Atopy Neg Hx    Immunodeficiency Neg Hx    Urticaria Neg Hx    Eczema Neg Hx    Past Surgical History:  Procedure Laterality Date   COLONOSCOPY W/ POLYPECTOMY     ELECTROCARDIOGRAM  12/04/2006   IR RADIOLOGY PERIPHERAL GUIDED IV START  04/21/2018   IR US  GUIDE VASC ACCESS LEFT  04/21/2018   NASAL SEPTUM SURGERY     Stress Cardiolite  02/13/2002   Social History   Social History Narrative   Lives alone (widowed 1998).   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      2 sons and 1 daughter.        Right-handed.   No daily caffeine use.   Immunization History  Administered Date(s) Administered   Fluad Quad(high Dose 65+) 05/07/2019, 05/24/2020, 05/19/2021, 05/15/2022   Fluad Trivalent(High Dose 65+) 05/06/2023   Influenza Split 06/04/2011, 05/27/2012   Influenza Whole 08/18/2008, 05/26/2010   Influenza, High Dose Seasonal PF 05/22/2017, 06/11/2018   Influenza,inj,Quad PF,6+ Mos 06/08/2013, 05/19/2014, 06/08/2015, 05/07/2016   Influenza-Unspecified 05/17/2020   PFIZER(Purple Top)SARS-COV-2 Vaccination 10/27/2019, 11/17/2019, 10/11/2020   Pfizer Covid-19 Vaccine Bivalent Booster 11yrs & up 12/21/2021   Pneumococcal Conjugate-13 03/17/2014   Pneumococcal Polysaccharide-23 11/23/2009, 06/08/2015, 07/14/2018   Td 11/09/2002   Tdap 07/03/2013, 01/03/2024   Zoster Recombinant(Shingrix ) 02/19/2019, 08/27/2019   Zoster, Live 06/17/2013     Objective: Vital Signs: There were no vitals taken for this visit.   Physical Exam   Musculoskeletal Exam: ***  CDAI Exam: CDAI Score: -- Patient Global: --; Provider Global: -- Swollen: --; Tender: -- Joint Exam 01/29/2024   No joint exam has been  documented for this visit   There is currently no information documented on the homunculus. Go to the Rheumatology activity and complete the homunculus joint exam.  Investigation: No additional findings.  Imaging: No results found.  Recent Labs: Lab Results  Component Value Date   WBC 5.3 12/25/2023   HGB 13.5 12/25/2023   PLT 197.0 12/25/2023   NA 139 12/25/2023   K 3.8 12/25/2023   CL 102 12/25/2023   CO2 30 12/25/2023   GLUCOSE 88 12/25/2023   BUN 18 12/25/2023   CREATININE 0.93 12/25/2023   BILITOT 0.6 12/25/2023   ALKPHOS 70 12/25/2023   AST 17 12/25/2023   ALT 9 12/25/2023   PROT 6.8 12/25/2023   ALBUMIN 4.6 12/25/2023   CALCIUM 10.0 12/25/2023   GFRAA >60 03/10/2019    Speciality Comments: No specialty comments available.  Procedures:  No procedures performed Allergies: 2,4-d dimethylamine; Albuterol ; Cefuroxime axetil; Doxycycline ; Iodinated contrast media; Latex; Lisinopril; Lovastatin; and Sulfonamide derivatives   Assessment / Plan:     Visit Diagnoses: No diagnosis found.  Orders: No orders of the defined types were placed  in this encounter.  No orders of the defined types were placed in this encounter.   Face-to-face time spent with patient was *** minutes. Greater than 50% of time was spent in counseling and coordination of care.  Follow-Up Instructions: No follow-ups on file.   Dee Farber, CMA  Note - This record has been created using Animal nutritionist.  Chart creation errors have been sought, but may not always  have been located. Such creation errors do not reflect on  the standard of medical care.

## 2024-01-27 NOTE — Progress Notes (Signed)
 Office Visit Note  Patient: Samantha [name]             Date of Birth: 1946-07-30           MRN: [id number]             PCP: Donnie Galea, MD Referring: Donnie Galea, MD Visit Date: 01/31/2024 Occupation: @GUAROCC @  Subjective:  Pain in joints and muscles  History of Present Illness: Samantha [name] is a 78 y.o. female with osteoarthritis and fibromyalgia.  She returns today after her last visit on Jan 29, 2023.  Patient states she has been having increased pain and discomfort in her bilateral hips for the last few weeks.  The pain has increased in the last 2 to 3 weeks.  She also has ongoing discomfort in her knee joints.  She continues to have some discomfort in her both hands and both feet.  She has some discomfort in her shoulders.  She continues to have some generalized pain and discomfort from fibromyalgia.    Activities of Daily Living:  Patient reports morning stiffness for 15 minutes.   Patient Denies nocturnal pain.  Difficulty dressing/grooming: Denies Difficulty climbing stairs: Reports Difficulty getting out of chair: Reports Difficulty using hands for taps, buttons, cutlery, and/or writing: Reports  Review of Systems  Constitutional:  Positive for fatigue.  HENT:  Negative for mouth sores and mouth dryness.   Eyes:  Positive for dryness.  Respiratory:  Negative for difficulty breathing.   Cardiovascular:  Negative for chest pain and palpitations.       Followed by cardiology, per patient  Gastrointestinal:  Negative for blood in stool, constipation and diarrhea.  Endocrine: Positive for increased urination.  Genitourinary:  Negative for involuntary urination.  Musculoskeletal:  Positive for joint pain, joint pain, myalgias, morning stiffness, muscle tenderness and myalgias. Negative for gait problem, joint swelling and muscle weakness.  Skin:  Positive for hair loss. Negative for color change, rash and sensitivity to sunlight.  Allergic/Immunologic: Positive  for susceptible to infections.  Neurological:  Positive for headaches. Negative for dizziness.  Hematological:  Negative for swollen glands.  Psychiatric/Behavioral:  Positive for sleep disturbance. Negative for depressed mood. The patient is not nervous/anxious.     PMFS History:  Patient Active Problem List   Diagnosis Date Noted   SOB (shortness of breath) 09/26/2023   Complaints of total body pain 06/05/2023   OSA (obstructive sleep apnea) 05/31/2023   Non-recurrent acute serous otitis media of right ear 02/27/2023   Medication refill 02/27/2023   Snoring 01/03/2023   Acute non-recurrent maxillary sinusitis 07/19/2022   Cognitive impairment 05/07/2022   Other fatigue 05/07/2022   Medicare annual wellness visit, subsequent 12/04/2021   Fatigue 01/08/2021   DNR (do not resuscitate) 07/24/2020   Tinea versicolor 05/30/2020   Myalgia 12/24/2019   Bronchiectasis without complication (HCC) 08/27/2019   Dysphagia 07/23/2019   Nonintractable headache 03/19/2019   Paresthesia 03/19/2019   Decreased grip strength 02/04/2019   Local superficial swelling 02/02/2019   Weight loss 02/02/2019   Osteopenia 08/09/2018   Chronic rhinitis 07/23/2018   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   Healthcare maintenance 07/16/2018   Chest discomfort 01/30/2018   Moderate persistent asthma with acute exacerbation 01/30/2018   Advance care planning 01/02/2018   Vitamin D  deficiency 07/08/2017   SI joint arthritis (HCC) 06/19/2017   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/19/2017   Primary osteoarthritis of both knees 01/19/2017   Primary osteoarthritis of both feet 01/19/2017  Deviated septum 12/07/2016   Dyspnea on exertion 11/14/2016   Diverticulosis 03/19/2016   Seasonal and perennial allergic rhinitis 01/18/2016   Lumbar compression fracture (HCC) 06/20/2015   Fibromyalgia 02/26/2013   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   IRRITABLE BOWEL SYNDROME 04/06/2010   THYROID  NODULE,  RIGHT 12/07/2009   OSTEOARTHRITIS 11/28/2009   Chest pain of uncertain etiology 08/26/2008   FIBROIDS, UTERUS 05/10/2008   Insomnia 05/10/2008   ASYMPTOMATIC POSTMENOPAUSAL STATUS 05/10/2008   Essential hypertension 01/28/2008   HYPERCHOLESTEROLEMIA 01/07/2008   GLAUCOMA 01/07/2008   Gastroesophageal reflux disease 04/14/2007   Asthma 04/14/2007    Past Medical History:  Diagnosis Date   ALLERGIC RHINITIS 07/26/2008   ASTHMA 04/14/2007   ASYMPTOMATIC POSTMENOPAUSAL STATUS 05/10/2008   Bronchiectasis (HCC)    CHEST PAIN 08/26/2008   COLONIC POLYPS, HX OF 04/14/2007   colonic leiomyoma   Decreased grip strength    Episcleritis    FIBROIDS, UTERUS 05/10/2008   Fibromyalgia    Gait abnormality    GERD 04/14/2007   HYPERCHOLESTEROLEMIA 01/07/2008   HYPERGLYCEMIA 11/28/2009   HYPERTENSION 01/28/2008   INSOMNIA 05/10/2008   Irritable bowel syndrome 04/06/2010   OSTEOARTHRITIS 11/28/2009   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    Family History  Problem Relation Age of Onset   Diabetes Mother    Hypertension Mother    Glaucoma Mother    Polymyositis Mother    Heart disease Father        CHF   Allergic rhinitis Father    Asthma Father    Other Father        sideoblastic anemia   Allergic rhinitis Sister    Asthma Sister    Allergic rhinitis Brother    Asthma Brother    Prostate cancer Brother    Sleep apnea Brother    Allergic rhinitis Maternal Aunt    Asthma Maternal Aunt    Allergic rhinitis Paternal Aunt    Asthma Paternal Aunt    Colon cancer Cousin    Breast cancer Cousin    Cancer Neg Hx        No FH of Colon Cancer   Angioedema Neg Hx    Atopy Neg Hx    Immunodeficiency Neg Hx    Urticaria Neg Hx    Eczema Neg Hx    Past Surgical History:  Procedure Laterality Date   COLONOSCOPY W/ POLYPECTOMY     ELECTROCARDIOGRAM  12/04/2006   IR RADIOLOGY PERIPHERAL GUIDED IV START  04/21/2018   IR US  GUIDE VASC ACCESS LEFT  04/21/2018   NASAL SEPTUM SURGERY     Stress  Cardiolite  02/13/2002   Social History   Social History Narrative   Lives alone (widowed 1998).   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      2 sons and 1 daughter.        Right-handed.   No daily caffeine use.   Immunization History  Administered Date(s) Administered   Fluad Quad(high Dose 65+) 05/07/2019, 05/24/2020, 05/19/2021, 05/15/2022   Fluad Trivalent(High Dose 65+) 05/06/2023   Influenza Split 06/04/2011, 05/27/2012   Influenza Whole 08/18/2008, 05/26/2010   Influenza, High Dose Seasonal PF 05/22/2017, 06/11/2018   Influenza,inj,Quad PF,6+ Mos 06/08/2013, 05/19/2014, 06/08/2015, 05/07/2016   Influenza-Unspecified 05/17/2020   PFIZER(Purple Top)SARS-COV-2 Vaccination 10/27/2019, 11/17/2019, 10/11/2020   Pfizer Covid-19 Vaccine Bivalent Booster 19yrs & up 12/21/2021   Pneumococcal Conjugate-13 03/17/2014   Pneumococcal Polysaccharide-23 11/23/2009, 06/08/2015, 07/14/2018   Td 11/09/2002   Tdap 07/03/2013, 01/03/2024   Zoster Recombinant(Shingrix ) 02/19/2019,  08/27/2019   Zoster, Live 06/17/2013     Objective: Vital Signs: BP (!) 155/71 (BP Location: Left Arm, Patient Position: Sitting, Cuff Size: Normal)   Pulse 65   Resp 14   Ht 5' 1.75" (1.568 m)   Wt 135 lb 3.2 oz (61.3 kg)   BMI 24.93 kg/m    Physical Exam Vitals and nursing note reviewed.  Constitutional:      Appearance: She is well-developed.  HENT:     Head: Normocephalic and atraumatic.  Eyes:     Conjunctiva/sclera: Conjunctivae normal.  Cardiovascular:     Rate and Rhythm: Normal rate and regular rhythm.     Heart sounds: Normal heart sounds.  Pulmonary:     Effort: Pulmonary effort is normal.     Breath sounds: Normal breath sounds.  Abdominal:     General: Bowel sounds are normal.     Palpations: Abdomen is soft.  Musculoskeletal:     Cervical back: Normal range of motion.  Lymphadenopathy:     Cervical: No cervical adenopathy.  Skin:    General: Skin is warm and dry.      Capillary Refill: Capillary refill takes less than 2 seconds.  Neurological:     Mental Status: She is alert and oriented to person, place, and time.  Psychiatric:        Behavior: Behavior normal.      Musculoskeletal Exam: Cervical, thoracic and lumbar spine with good range of motion.  Shoulders, elbow joints, wrist joints, MCPs PIPs and DIPs with good range of motion with no synovitis.  She had bilateral PIP and DIP thickening.  She had tenderness over bilateral trochanteric bursa.  She had painful range of motion of bilateral hip joints.  She had good range of motion bilateral knee joints with discomfort.  No warmth swelling or effusion was noted.  She had thickening of bilateral first MTP joints.  No synovitis or tenderness was noted over MTPs or PIPs.  CDAI Exam: CDAI Score: -- Patient Global: --; Provider Global: -- Swollen: --; Tender: -- Joint Exam 01/31/2024   No joint exam has been documented for this visit   There is currently no information documented on the homunculus. Go to the Rheumatology activity and complete the homunculus joint exam.  Investigation: No additional findings.  Imaging: No results found.  Recent Labs: Lab Results  Component Value Date   WBC 5.3 12/25/2023   HGB 13.5 12/25/2023   PLT 197.0 12/25/2023   NA 139 12/25/2023   K 3.8 12/25/2023   CL 102 12/25/2023   CO2 30 12/25/2023   GLUCOSE 88 12/25/2023   BUN 18 12/25/2023   CREATININE 0.93 12/25/2023   BILITOT 0.6 12/25/2023   ALKPHOS 70 12/25/2023   AST 17 12/25/2023   ALT 9 12/25/2023   PROT 6.8 12/25/2023   ALBUMIN 4.6 12/25/2023   CALCIUM 10.0 12/25/2023   GFRAA >60 03/10/2019    Speciality Comments: No specialty comments available.  Procedures:  Large Joint Inj: R greater trochanter on 01/31/2024 11:42 AM Indications: pain Details: 27 G 1.5 in needle, lateral approach  Arthrogram: No  Medications: 40 mg triamcinolone  acetonide 40 MG/ML; 1.5 mL lidocaine  1 % Aspirate: 0  mL Outcome: tolerated well, no immediate complications  Increased risk of infection, dermal atrophy and hypopigmentation was discussed. Procedure, treatment alternatives, risks and benefits explained, specific risks discussed. Consent was given by the patient. Immediately prior to procedure a time out was called to verify the correct patient, procedure, equipment, support staff  and site/side marked as required. Patient was prepped and draped in the usual sterile fashion.     Allergies: 2,4-d dimethylamine; Albuterol ; Cefuroxime axetil; Doxycycline ; Iodinated contrast media; Latex; Lisinopril; Lovastatin; and Sulfonamide derivatives   Assessment / Plan:     Visit Diagnoses: Primary osteoarthritis of both hands-bilateral PIP and DIP thickening was noted.  She continues to have some stiffness in her hands.  Joint protection was discussed.  Bilateral carpal tunnel syndrome - NCV with EMG on 05/06/19: moderate right carpal tunnel syndrome.  Currently not symptomatic.  Primary osteoarthritis of both shoulders-she has off-and-on discomfort in her shoulder joints.  Her shoulder joints with good range of motion.  Trochanteric bursitis of both hips-she had tenderness  on palpation of bilateral trochanteric bursa.  Patient requested right trochanteric bursa injection.  After informed consent was obtained and side effects were discussed right trochanteric bursa was injected with lidocaine  and Kenalog  as described above.  Patient tolerated the procedure well.  Postprocedure instructions were given.  I advised patient to contact us  if her symptoms of left trochanteric bursitis get worse as she may need a cortisone injection.  Primary osteoarthritis of both hips -she is of pain and discomfort in the bilateral hip joints and difficulty getting out of the car sometimes.  She had painful range of motion of bilateral hip joints.  Plan: XR HIPS BILAT W OR W/O PELVIS 3-4 VIEWS.  X-ray showed bilateral moderate  osteoarthritis of the hip joints.  A handout on exercises was placed in the AVS.  Primary osteoarthritis of both knees -she comes as of increased pain and discomfort in her knee joints.  No warmth swelling or effusion was noted.  Plan: XR KNEE 3 VIEW RIGHT, XR KNEE 3 VIEW LEFT.  X-ray showed bilateral moderate osteoarthritis and moderate chondromalacia patella.  A handout on exercises was placed in the AVS.  Primary osteoarthritis of both feet-she had chills with changes in her feet.  Proper fitting shoes with arch support was advised.  Fibromyalgia-she continues to have generalized pain and discomfort from fibromyalgia.  Stretching and regular exercise was emphasized.  Other insomnia-good sleep hygiene was discussed.  Other fatigue-most likely related to insomnia and fibromyalgia.  History of vitamin D  deficiency-vitamin D  was advised.  Osteopenia of multiple sites - 07/22 DXA c/w osteopenia done by GYN.  Patient states her repeat DEXA is scheduled.  Other medical problems listed as follows:  History of diverticulosis  History of IBS  History of gastroesophageal reflux (GERD)  History of asthma  History of kidney stones  History of hypertension-blood pressure was elevated at 155/71.  Repeat blood pressure was 132/73.  Patient was advised to monitor blood pressure closely.  History of glaucoma  History of hyperlipidemia  Orders: Orders Placed This Encounter  Procedures   XR HIPS BILAT W OR W/O PELVIS 3-4 VIEWS   XR KNEE 3 VIEW RIGHT   XR KNEE 3 VIEW LEFT   No orders of the defined types were placed in this encounter.    Follow-Up Instructions: Return for Osteoarthritis.   Nicholas Bari, MD  Note - This record has been created using Animal nutritionist.  Chart creation errors have been sought, but may not always  have been located. Such creation errors do not reflect on  the standard of medical care.

## 2024-01-29 ENCOUNTER — Ambulatory Visit: Payer: Medicare PPO | Admitting: Rheumatology

## 2024-01-29 DIAGNOSIS — Z8719 Personal history of other diseases of the digestive system: Secondary | ICD-10-CM

## 2024-01-29 DIAGNOSIS — Z8679 Personal history of other diseases of the circulatory system: Secondary | ICD-10-CM

## 2024-01-29 DIAGNOSIS — Z8669 Personal history of other diseases of the nervous system and sense organs: Secondary | ICD-10-CM

## 2024-01-29 DIAGNOSIS — M7062 Trochanteric bursitis, left hip: Secondary | ICD-10-CM

## 2024-01-29 DIAGNOSIS — G5603 Carpal tunnel syndrome, bilateral upper limbs: Secondary | ICD-10-CM

## 2024-01-29 DIAGNOSIS — Z8639 Personal history of other endocrine, nutritional and metabolic disease: Secondary | ICD-10-CM

## 2024-01-29 DIAGNOSIS — Z87442 Personal history of urinary calculi: Secondary | ICD-10-CM

## 2024-01-29 DIAGNOSIS — M19041 Primary osteoarthritis, right hand: Secondary | ICD-10-CM

## 2024-01-29 DIAGNOSIS — G4709 Other insomnia: Secondary | ICD-10-CM

## 2024-01-29 DIAGNOSIS — Z8709 Personal history of other diseases of the respiratory system: Secondary | ICD-10-CM

## 2024-01-29 DIAGNOSIS — M160 Bilateral primary osteoarthritis of hip: Secondary | ICD-10-CM

## 2024-01-29 DIAGNOSIS — M8589 Other specified disorders of bone density and structure, multiple sites: Secondary | ICD-10-CM

## 2024-01-29 DIAGNOSIS — M19072 Primary osteoarthritis, left ankle and foot: Secondary | ICD-10-CM

## 2024-01-29 DIAGNOSIS — M19011 Primary osteoarthritis, right shoulder: Secondary | ICD-10-CM

## 2024-01-29 DIAGNOSIS — M170 Bilateral primary osteoarthritis of knee: Secondary | ICD-10-CM

## 2024-01-29 DIAGNOSIS — R5383 Other fatigue: Secondary | ICD-10-CM

## 2024-01-29 DIAGNOSIS — M797 Fibromyalgia: Secondary | ICD-10-CM

## 2024-01-31 ENCOUNTER — Ambulatory Visit: Attending: Rheumatology | Admitting: Rheumatology

## 2024-01-31 ENCOUNTER — Ambulatory Visit

## 2024-01-31 ENCOUNTER — Ambulatory Visit (INDEPENDENT_AMBULATORY_CARE_PROVIDER_SITE_OTHER)

## 2024-01-31 ENCOUNTER — Encounter: Payer: Self-pay | Admitting: Rheumatology

## 2024-01-31 VITALS — BP 132/73 | HR 60 | Resp 14 | Ht 61.75 in | Wt 135.2 lb

## 2024-01-31 DIAGNOSIS — M1712 Unilateral primary osteoarthritis, left knee: Secondary | ICD-10-CM

## 2024-01-31 DIAGNOSIS — Z8639 Personal history of other endocrine, nutritional and metabolic disease: Secondary | ICD-10-CM

## 2024-01-31 DIAGNOSIS — M19072 Primary osteoarthritis, left ankle and foot: Secondary | ICD-10-CM

## 2024-01-31 DIAGNOSIS — Z87442 Personal history of urinary calculi: Secondary | ICD-10-CM

## 2024-01-31 DIAGNOSIS — M19042 Primary osteoarthritis, left hand: Secondary | ICD-10-CM

## 2024-01-31 DIAGNOSIS — M19041 Primary osteoarthritis, right hand: Secondary | ICD-10-CM | POA: Diagnosis not present

## 2024-01-31 DIAGNOSIS — M170 Bilateral primary osteoarthritis of knee: Secondary | ICD-10-CM | POA: Diagnosis not present

## 2024-01-31 DIAGNOSIS — M19011 Primary osteoarthritis, right shoulder: Secondary | ICD-10-CM | POA: Diagnosis not present

## 2024-01-31 DIAGNOSIS — Z8669 Personal history of other diseases of the nervous system and sense organs: Secondary | ICD-10-CM

## 2024-01-31 DIAGNOSIS — M160 Bilateral primary osteoarthritis of hip: Secondary | ICD-10-CM

## 2024-01-31 DIAGNOSIS — M1711 Unilateral primary osteoarthritis, right knee: Secondary | ICD-10-CM

## 2024-01-31 DIAGNOSIS — Z8679 Personal history of other diseases of the circulatory system: Secondary | ICD-10-CM

## 2024-01-31 DIAGNOSIS — M8589 Other specified disorders of bone density and structure, multiple sites: Secondary | ICD-10-CM

## 2024-01-31 DIAGNOSIS — G5603 Carpal tunnel syndrome, bilateral upper limbs: Secondary | ICD-10-CM

## 2024-01-31 DIAGNOSIS — M797 Fibromyalgia: Secondary | ICD-10-CM | POA: Diagnosis not present

## 2024-01-31 DIAGNOSIS — G4709 Other insomnia: Secondary | ICD-10-CM | POA: Diagnosis not present

## 2024-01-31 DIAGNOSIS — M19071 Primary osteoarthritis, right ankle and foot: Secondary | ICD-10-CM | POA: Diagnosis not present

## 2024-01-31 DIAGNOSIS — Z8719 Personal history of other diseases of the digestive system: Secondary | ICD-10-CM

## 2024-01-31 DIAGNOSIS — M7062 Trochanteric bursitis, left hip: Secondary | ICD-10-CM

## 2024-01-31 DIAGNOSIS — M7061 Trochanteric bursitis, right hip: Secondary | ICD-10-CM | POA: Diagnosis not present

## 2024-01-31 DIAGNOSIS — M19012 Primary osteoarthritis, left shoulder: Secondary | ICD-10-CM

## 2024-01-31 DIAGNOSIS — R5383 Other fatigue: Secondary | ICD-10-CM

## 2024-01-31 DIAGNOSIS — Z8709 Personal history of other diseases of the respiratory system: Secondary | ICD-10-CM

## 2024-01-31 MED ORDER — TRIAMCINOLONE ACETONIDE 40 MG/ML IJ SUSP
40.0000 mg | INTRAMUSCULAR | Status: AC | PRN
  Administered 2024-01-31: 40 mg via INTRA_ARTICULAR

## 2024-01-31 MED ORDER — LIDOCAINE HCL 1 % IJ SOLN
1.5000 mL | INTRAMUSCULAR | Status: AC | PRN
  Administered 2024-01-31: 1.5 mL

## 2024-01-31 NOTE — Patient Instructions (Addendum)
 Hip Exercises Ask your health care provider which exercises are safe for you. Do exercises exactly as told by your provider and adjust them as told. It is normal to feel mild stretching, pulling, tightness, or discomfort as you do these exercises. Stop right away if you feel sudden pain or your pain gets worse. Do not begin these exercises until told by your provider. Stretching and range-of-motion exercises These exercises warm up your muscles and joints and improve the movement and flexibility of your hip. They also help to relieve pain, numbness, and tingling. You may be asked to limit your range of motion if you had a hip replacement. Talk to your provider about these limits. Hamstrings, supine  Lie on your back (supine position). Loop a belt, towel, or exercise band over the ball of your left / right foot. The ball of your foot is on the walking surface, right under your toes. Straighten your left / right knee and slowly pull on the belt, towel, or band to raise your leg until you feel a gentle stretch behind your knee (hamstring). Do not let your knee bend while you do this. Keep your other leg flat on the floor. Hold this position for __________ seconds. Slowly return your leg to the starting position. Repeat __________ times. Complete this exercise __________ times a day. Hip rotation  Lie on your back on a firm surface. With your left / right hand, gently pull your left / right knee toward the shoulder that is on the same side of the body. Stop when your knee is pointing toward the ceiling. Hold your left / right ankle with your other hand. Keeping your knee steady, gently pull your left / right ankle toward your other shoulder until you feel a stretch in your butt. Keep your hips and shoulders firmly planted while you do this stretch. Hold this position for __________ seconds. Repeat __________ times. Complete this exercise __________ times a day. Seated stretch This exercise is  sometimes called hamstrings and adductors stretch. Sit on the floor with your legs stretched wide. Keep your knees straight during this exercise. Keeping your head and back in a straight line, bend at your waist to reach for your left foot (position A). You should feel a stretch in your right inner thigh (adductors). Hold this position for __________ seconds. Then slowly return to the upright position. Keeping your head and back in a straight line, bend at your waist to reach forward (position B). You should feel a stretch behind both of your thighs and knees (hamstrings). Hold this position for __________ seconds. Then slowly return to the upright position. Keeping your head and back in a straight line, bend at your waist to reach for your right foot (position C). You should feel a stretch in your left inner thigh (adductors). Hold this position for __________ seconds. Then slowly return to the upright position. Repeat __________ times. Complete this exercise __________ times a day. Lunge This exercise stretches the muscles of the hip (hip flexors). Place your left / right knee on the floor and bend your other knee so that is directly over your ankle. You should be half-kneeling. Keep good posture with your head over your shoulders. Tighten your butt muscles to point your tailbone downward. This will prevent your back from arching too much. You should feel a gentle stretch in the front of your left / right thigh and hip. If you do not feel a stretch, slide your other foot forward slightly and  then slowly lunge forward with your chest up until your knee once again lines up over your ankle. Make sure your tailbone continues to point downward. Hold this position for __________ seconds. Slowly return to the starting position. Repeat __________ times. Complete this exercise __________ times a day. Strengthening exercises These exercises build strength and endurance in your hip. Endurance is the  ability to use your muscles for a long time, even after they get tired. Bridge This exercise strengthens the muscles of your hip (hip extensors). Lie on your back on a firm surface with your knees bent and your feet flat on the floor. Tighten your butt muscles and lift your bottom off the floor until the trunk of your body and your hips are level with your thighs. Do not arch your back. You should feel the muscles working in your butt and the back of your thighs. If you do not feel these muscles, slide your feet 1-2 inches (2.5-5 cm) farther away from your butt. Hold this position for __________ seconds. Slowly lower your hips to the starting position. Let your muscles relax completely between repetitions. Repeat __________ times. Complete this exercise __________ times a day. Straight leg raises, side-lying This exercise strengthens the muscles that move the hip joint away from the center of the body (hip abductors). Lie on your side with your left / right leg in the top position. Lie so your head, shoulder, hip, and knee line up. You may bend your bottom knee slightly to help you balance. Roll your hips slightly forward, so your hips are stacked directly over each other and your left / right knee is facing forward. Leading with your heel, lift your top leg 4-6 inches (10-15 cm). You should feel the muscles in your top hip lifting. Do not let your foot drift forward. Do not let your knee roll toward the ceiling. Hold this position for __________ seconds. Slowly return to the starting position. Let your muscles relax completely between repetitions. Repeat __________ times. Complete this exercise __________ times a day. Straight leg raises, side-lying This exercise strengthens the muscles that move the hip joint toward the center of the body (hip adductors). Lie on your side with your left / right leg in the bottom position. Lie so your head, shoulder, hip, and knee line up. You may place your  upper foot in front to help you balance. Roll your hips slightly forward, so your hips are stacked directly over each other and your left / right knee is facing forward. Tense the muscles in your inner thigh and lift your bottom leg 4-6 inches (10-15 cm). Hold this position for __________ seconds. Slowly return to the starting position. Let your muscles relax completely between repetitions. Repeat __________ times. Complete this exercise __________ times a day. Straight leg raises, supine This exercise strengthens the muscles in the front of your thigh (quadriceps and hip flexors). Lie on your back (supine position) with your left / right leg extended and your other knee bent. Tense the muscles in the front of your left / right thigh. You should see your kneecap slide up or see increased dimpling just above your knee. Keep these muscles tight as you raise your leg 4-6 inches (10-15 cm) off the floor. Do not let your knee bend. Hold this position for __________ seconds. Keep these muscles tense as you lower your leg. Relax the muscles slowly and completely between repetitions. Repeat __________ times. Complete this exercise __________ times a day. Hip abductors, standing This  exercise strengthens the muscles that move the leg and hip joint away from the center of the body (hip abductors). Tie one end of a rubber exercise band or tubing to a secure surface, such as a chair, table, or pole. Loop the other end of the band or tubing around your left / right ankle. Keeping your ankle with the band or tubing directly opposite the secured end, step away until there is tension in the tubing or band. Hold on to a chair, table, or pole as needed for balance. Lift your left / right leg out to your side. While you do this: Keep your back upright. Keep your shoulders over your hips. Keep your toes pointing forward. Make sure to use your hip muscles to slowly lift your leg. Do not tip your body or  forcefully lift your leg. Hold this position for __________ seconds. Slowly return to the starting position. Repeat __________ times. Complete this exercise __________ times a day. Squats This exercise strengthens the muscles in the front of your thigh (quadriceps). Stand in front of a table, or stand in a doorframe so your feet and knees are in line with the frame. You may place your hands on the table or frame for balance. Slowly bend your knees and lower your hips like you are going to sit in a chair. Keep your lower legs in a straight up-and-down position. Do not let your hips go lower than your knees. Do not bend your knees lower than told by your provider. If your hip pain increases, do not bend as low. Hold this position for ___________ seconds. Slowly push with your legs to return to standing. Do not use your hands to pull yourself to standing. Repeat __________ times. Complete this exercise __________ times a day. This information is not intended to replace advice given to you by your health care provider. Make sure you discuss any questions you have with your health care provider. Document Revised: 05/01/2022 Document Reviewed: 05/01/2022 Elsevier Patient Education  2024 [employer]  Iliotibial Band Syndrome Rehab Ask your health care provider which exercises are safe for you. Do exercises exactly as told by your provider and adjust them as told. It's normal to feel mild stretching, pulling, tightness, or discomfort as you do these exercises. Stop right away if you feel sudden pain or your pain gets a lot worse. Do not begin these exercises until told by your provider. Stretching and range-of-motion exercises These exercises warm up your muscles and joints. They also improve the movement and flexibility of your hip and pelvis. Quadriceps stretch, prone  Lie face down (prone) on a firm surface like a bed or padded floor. Bend your left / right knee. Reach back to hold your ankle  or pant leg. If you can't reach your ankle or pant leg, use a belt looped around your foot and grab the belt instead. Gently pull your heel toward your butt. Your knee should not slide out to the side. You should feel a stretch in the front of your thigh and knee, also called the quadriceps. Hold this position for __________ seconds. Repeat __________ times. Complete this exercise __________ times a day. Iliotibial band stretch The iliotibial band is a strip of tissue that runs along the outside of your hip down to your knee. Lie on your side with your left / right leg on top. Bend both knees and grab your left / right ankle. Stretch out your bottom arm to help you balance. Slowly bring  your top knee back so your thigh goes behind your back. Slowly lower your top leg toward the floor until you feel a gentle stretch on the outside of your left / right hip and thigh. If you don't feel a stretch and your knee won't go farther, place the heel of your other foot on top of your knee and pull your knee down toward the floor with your foot. Hold this position for __________ seconds. Repeat __________ times. Complete this exercise __________ times a day. Strengthening exercises These exercises build strength and endurance in your hip and pelvis. Endurance means your muscles can keep working even when they're tired. Straight leg raises, side-lying This exercise strengthens the muscles that rotate the leg at the hip and move it away from your body. These muscles are called hip abductors. Lie on your side with your left / right leg on top. Lie so your head, shoulder, hip, and knee line up. You can bend your bottom knee to help you balance. Roll your hips slightly forward so they're stacked directly over each other. Your left / right knee should face forward. Tense the muscles in your outer thigh and hip. Lift your top leg 4-6 inches (10-15 cm) off the ground. Hold this position for __________ seconds. Slowly  lower your leg back down to the starting position. Let your muscles fully relax before doing this exercise again. Repeat __________ times. Complete this exercise __________ times a day. Leg raises, prone This exercise strengthens the muscles that move the hips backward. These muscles are called hip extensors. Lie face down (prone) on your bed or a firm surface. You can put a pillow under your hips for comfort and to support your lower back. Bend your left / right knee so your foot points straight up toward the ceiling. Keep the other leg straight and behind you. Squeeze your butt muscles. Lift your left / right thigh off the firm surface. Do not let your back arch. Tense your thigh muscle as hard as you can without having more knee pain. Hold this position for __________ seconds. Slowly lower your leg to the starting position. Allow your leg to relax all the way. Repeat __________ times. Complete this exercise __________ times a day. Hip hike  Stand sideways on a bottom step. Place your feet so that your left / right leg is on the step, and the other foot is hanging off the side. If you need support for balance, hold onto a railing or wall. Keep your knees straight and your abdomen square, meaning your hips are level. Then, lift your left / right hip up toward the ceiling. Slowly let your leg that's hanging off the step lower towards the floor. Your foot should get closer to the ground. Do not lean or bend your knees during this movement. Repeat __________ times. Complete this exercise __________ times a day. This information is not intended to replace advice given to you by your health care provider. Make sure you discuss any questions you have with your health care provider. Document Revised: 11/09/2022 Document Reviewed: 11/09/2022 Elsevier Patient Education  2024 [employer]  Exercises for Chronic Knee Pain Chronic knee pain is pain that lasts longer than 3 months. For most people with  chronic knee pain, exercise and weight loss is an important part of treatment. Your health care provider may want you to focus on: Making the muscles that support your knee stronger. This can take pressure off your knee and reduce pain. Preventing knee  stiffness. How far you can move your knee, keeping it there or making it farther. Losing weight (if this applies) to take pressure off your knee, lower your risk for injury, and make it easier for you to exercise. Your provider will help you make an exercise program that fits your needs and physical abilities. Below are simple, low-impact exercises you can do at home. Ask your provider or physical therapist how often you should do your exercise program and how many times to repeat each exercise. General safety tips  Get your provider's approval before doing any exercises. Start slowly and stop any time you feel pain. Do not exercise if your knee pain is flaring up. Warm up first. Stretching a cold muscle can cause an injury. Do 5-10 minutes of easy movement or light stretching before beginning your exercises. Do 5-10 minutes of low-impact activity (like walking or cycling) before starting strengthening exercises. Contact your provider any time you have pain during or after exercising. Exercise can cause discomfort but should not be painful. It is normal to be a little stiff or sore after exercising. Stretching and range-of-motion exercises Front thigh stretch  Stand up straight and support your body by holding on to a chair or resting one hand on a wall. With your legs straight and close together, bend one knee to lift your heel up toward your butt. Using one hand for support, grab your ankle with your free hand. Pull your foot up closer toward your butt to feel the stretch in front of your thigh. Hold the stretch for 30 seconds. Repeat __________ times. Complete this exercise __________ times a day. Back thigh stretch  Sit on the floor with  your back straight and your legs out straight in front of you. Place the palms of your hands on the floor and slide them toward your feet as you bend at the hip. Try to touch your nose to your knees and feel the stretch in the back of your thighs. Hold for 30 seconds. Repeat __________ times. Complete this exercise __________ times a day. Calf stretch  Stand facing a wall. Place the palms of your hands flat against the wall, arms extended, and lean slightly against the wall. Get into a lunge position with one leg bent at the knee and the other leg stretched out straight behind you. Keep both feet facing the wall and increase the bend in your knee while keeping the heel of the other leg flat on the ground. You should feel the stretch in your calf. Hold for 30 seconds. Repeat __________ times. Complete this exercise __________ times a day. Strengthening exercises Straight leg lift  Lie on your back with one knee bent and the other leg out straight. Slowly lift the straight leg without bending the knee. Lift until your foot is about 12 inches (30 cm) off the floor. Hold for 3-5 seconds and slowly lower your leg. Repeat __________ times. Complete this exercise __________ times a day. Single leg dip  Stand between two chairs and put both hands on the backs of the chairs for support. Extend one leg out straight with your body weight resting on the heel of the standing leg. Slowly bend your standing knee to dip your body to the level that is comfortable for you. Hold for 3-5 seconds. Repeat __________ times. Complete this exercise __________ times a day. Hamstring curls  Stand straight, knees close together, facing the back of a chair. Hold on to the back of a chair with  both hands. Keep one leg straight. Bend the other knee while bringing the heel up toward the butt until the knee is bent at a 90-degree angle (right angle). Hold for 3-5 seconds. Repeat __________ times. Complete this  exercise __________ times a day. Wall squat  Stand straight with your back, hips, and head against a wall. Step forward one foot at a time with your back still against the wall. Your feet should be 2 feet (61 cm) from the wall at shoulder width. Keeping your back, hips, and head against the wall, slide down the wall to as close to a sitting position as you can get. Hold for 5-10 seconds, then slowly slide back up. Repeat __________ times. Complete this exercise __________ times a day. Step-ups  Stand in front of a sturdy platform or stool that is about 6 inches (15 cm) high. Slowly step up with your left / right foot, keeping your knee in line with your hip and foot. Do not let your knee bend so far that you cannot see your toes. Hold on to a chair for balance, but do not use it for support. Slowly unlock your knee and lower yourself to the starting position. Repeat __________ times. Complete this exercise __________ times a day. Contact a health care provider if: Your exercises cause pain. Your pain is worse after you exercise. Your pain prevents you from doing your exercises. This information is not intended to replace advice given to you by your health care provider. Make sure you discuss any questions you have with your health care provider. Document Revised: 09/11/2022 Document Reviewed: 09/11/2022 Elsevier Patient Education  2024 [employer].

## 2024-02-05 ENCOUNTER — Telehealth: Payer: Self-pay

## 2024-02-05 NOTE — Telephone Encounter (Signed)
 Called patient on mobile number.  Patient states she is still using Xopenex  inhaler more frequently than normal.  Patient needs to get to her calendar at home before she can schedule an appointment with Dr. Linder Revere.  Informed patient to let schedulers know that it is okay to put her in one of Dr. Joline Ned help spots (per Dr. Antonette Batters note).  Patient will call back to schedule.

## 2024-02-05 NOTE — Telephone Encounter (Signed)
-----   Message from [employer] sent at 02/05/2024 11:14 AM EDT ----- Regarding: Mashawn Brazil- please see Dr Vashti Gentles to me from April. I had hoped Mrs Griffiths was resolving her exacerbation. Can you check with her please. If she is still needing to use her rescue Xopenex  inhaler frequently, please get her in with me in a held spot.  ----- Message ----- From: Donnie Galea, MD Sent: 01/01/2024   8:51 PM EDT To: Faustina Hood, MD  I need your input.  She has had increasing Xopenex  use/need since earlier in the year.  Does she need updated PFTs through your clinic?  I appreciate your help.

## 2024-02-05 NOTE — Telephone Encounter (Signed)
 Left detailed message on VM for patient to call clinic.  (needs to make OV if using Xopenex  inhaler more frequently).

## 2024-02-06 NOTE — Telephone Encounter (Signed)
 Patient is scheduled.NFN

## 2024-02-18 ENCOUNTER — Ambulatory Visit

## 2024-02-18 ENCOUNTER — Ambulatory Visit: Admission: RE | Admit: 2024-02-18 | Source: Ambulatory Visit

## 2024-02-18 ENCOUNTER — Ambulatory Visit: Payer: Self-pay | Admitting: Family Medicine

## 2024-02-18 ENCOUNTER — Ambulatory Visit
Admission: RE | Admit: 2024-02-18 | Discharge: 2024-02-18 | Disposition: A | Discharge status: Home / Self Care | Source: Ambulatory Visit | Attending: Family Medicine | Admitting: Family Medicine

## 2024-02-18 DIAGNOSIS — N644 Mastodynia: Secondary | ICD-10-CM | POA: Diagnosis not present

## 2024-02-25 ENCOUNTER — Encounter: Payer: Self-pay | Admitting: Family Medicine

## 2024-03-10 DIAGNOSIS — H2513 Age-related nuclear cataract, bilateral: Secondary | ICD-10-CM | POA: Diagnosis not present

## 2024-03-10 DIAGNOSIS — H43813 Vitreous degeneration, bilateral: Secondary | ICD-10-CM | POA: Diagnosis not present

## 2024-03-10 DIAGNOSIS — H04123 Dry eye syndrome of bilateral lacrimal glands: Secondary | ICD-10-CM | POA: Diagnosis not present

## 2024-03-10 DIAGNOSIS — Z0100 Encounter for examination of eyes and vision without abnormal findings: Secondary | ICD-10-CM | POA: Diagnosis not present

## 2024-03-10 DIAGNOSIS — H40003 Preglaucoma, unspecified, bilateral: Secondary | ICD-10-CM | POA: Diagnosis not present

## 2024-03-17 DIAGNOSIS — H2512 Age-related nuclear cataract, left eye: Secondary | ICD-10-CM | POA: Diagnosis not present

## 2024-03-19 ENCOUNTER — Telehealth: Payer: Self-pay

## 2024-03-19 NOTE — Telephone Encounter (Signed)
 Copied from CRM [id number]. Topic: Clinical - Request for Lab/Test Order >> Mar 17, 2024  3:09 PM Samantha [name] wrote: Reason for CRM: Pt stated she had a call last week from someone attempting to schedule her a breathing test, but I don't see any orders for a breathing test including a PFT. I don't see any notes or encounters stating she was called with details either. Pt stated she was told that her PCP Dr. Cleatus is requesting her to have a breathing test completed. Pt is requesting the PFT to be done before 7/23 if possible. Please call the pt back at [phone].      Spoke w/ pt we do not have any orders and Advise for her to call her PCP

## 2024-03-24 ENCOUNTER — Encounter: Payer: Self-pay | Admitting: Family Medicine

## 2024-03-24 ENCOUNTER — Encounter: Payer: Self-pay | Admitting: Ophthalmology

## 2024-03-24 ENCOUNTER — Ambulatory Visit (INDEPENDENT_AMBULATORY_CARE_PROVIDER_SITE_OTHER): Admitting: Family Medicine

## 2024-03-24 VITALS — BP 128/64 | HR 62 | Temp 98.0°F | Ht 62.0 in | Wt 135.4 lb

## 2024-03-24 DIAGNOSIS — J4541 Moderate persistent asthma with (acute) exacerbation: Secondary | ICD-10-CM

## 2024-03-24 DIAGNOSIS — J069 Acute upper respiratory infection, unspecified: Secondary | ICD-10-CM | POA: Diagnosis not present

## 2024-03-24 NOTE — Progress Notes (Unsigned)
 Should couldn't get nasalcrom  due to lack of availability.    Has cataract surgery scheduled for next week, 8 days from now.    I will send a note to Dr. Neysa about potentially updating her PFTs when she is feeling better.  She had inc xopenex  use this year, after prev infection.    Sx started 03/15/24.  Had some aches at the time.  Then lost her voice.  Voice then improved but not fully back to baseline.  No fevers.  Using xopenex  daily recently occ BID.  No sputum except for clearing sputum/postnasal gtt in the AMs.  No wheeze but some dyspnea, improved with xopenex .  More nasal congestion.    She is going to see ortho about her R knee, after a near fall.  She was doing laundry and tried to step over a pile of clothes.  Slipped but didn't go to ground.    Meds, vitals, and allergies reviewed.   ROS: Per HPI unless specifically indicated in ROS section   L max sinus mildly ttp.  Ctab

## 2024-03-24 NOTE — Patient Instructions (Signed)
 Update us  as needed.  Your lungs sound clear.  You should gradually improve. If not, then let us  know and please update the eye clinic on Friday.  Take care.  Glad to see you.

## 2024-03-25 ENCOUNTER — Encounter: Payer: Self-pay | Admitting: Family Medicine

## 2024-03-25 ENCOUNTER — Telehealth: Payer: Self-pay | Admitting: Family Medicine

## 2024-03-25 NOTE — Assessment & Plan Note (Signed)
 Lungs are clear.  She is improving in the meantime.  Discussed options. Update us  as needed here in clinic. She should gradually improve. If not, then let us  know and I asked her to please update the eye clinic on Friday.  She agreed.

## 2024-03-25 NOTE — Telephone Encounter (Signed)
 Dr. Ramond would like your input about potentially updating her PFTs when she is feeling better.  She had inc xopenex  use this year, after prev infection.  Thank you.

## 2024-03-26 DIAGNOSIS — M1711 Unilateral primary osteoarthritis, right knee: Secondary | ICD-10-CM | POA: Diagnosis not present

## 2024-03-26 DIAGNOSIS — M170 Bilateral primary osteoarthritis of knee: Secondary | ICD-10-CM | POA: Diagnosis not present

## 2024-03-26 DIAGNOSIS — M1712 Unilateral primary osteoarthritis, left knee: Secondary | ICD-10-CM | POA: Diagnosis not present

## 2024-03-30 NOTE — Discharge Instructions (Signed)

## 2024-04-01 ENCOUNTER — Other Ambulatory Visit: Payer: Self-pay

## 2024-04-01 ENCOUNTER — Ambulatory Visit: Payer: Self-pay | Admitting: Anesthesiology

## 2024-04-01 ENCOUNTER — Ambulatory Visit
Admission: RE | Admit: 2024-04-01 | Discharge: 2024-04-01 | Disposition: A | Discharge status: Home / Self Care | Attending: Ophthalmology | Admitting: Ophthalmology

## 2024-04-01 ENCOUNTER — Encounter
Admission: RE | Disposition: A | Discharge status: Home / Self Care | Payer: Self-pay | Source: Home / Self Care | Attending: Ophthalmology

## 2024-04-01 ENCOUNTER — Encounter: Payer: Self-pay | Admitting: Ophthalmology

## 2024-04-01 DIAGNOSIS — M797 Fibromyalgia: Secondary | ICD-10-CM | POA: Insufficient documentation

## 2024-04-01 DIAGNOSIS — K219 Gastro-esophageal reflux disease without esophagitis: Secondary | ICD-10-CM | POA: Diagnosis not present

## 2024-04-01 DIAGNOSIS — G4733 Obstructive sleep apnea (adult) (pediatric): Secondary | ICD-10-CM | POA: Insufficient documentation

## 2024-04-01 DIAGNOSIS — I10 Essential (primary) hypertension: Secondary | ICD-10-CM | POA: Insufficient documentation

## 2024-04-01 DIAGNOSIS — J45909 Unspecified asthma, uncomplicated: Secondary | ICD-10-CM | POA: Insufficient documentation

## 2024-04-01 DIAGNOSIS — I7300 Raynaud's syndrome without gangrene: Secondary | ICD-10-CM | POA: Insufficient documentation

## 2024-04-01 DIAGNOSIS — H2512 Age-related nuclear cataract, left eye: Secondary | ICD-10-CM | POA: Insufficient documentation

## 2024-04-01 HISTORY — DX: Obstructive sleep apnea (adult) (pediatric): G47.33

## 2024-04-01 HISTORY — PX: CATARACT EXTRACTION W/PHACO: SHX586

## 2024-04-01 SURGERY — PHACOEMULSIFICATION, CATARACT, WITH IOL INSERTION
Anesthesia: Monitor Anesthesia Care | Laterality: Left

## 2024-04-01 MED ORDER — BRIMONIDINE TARTRATE-TIMOLOL 0.2-0.5 % OP SOLN
OPHTHALMIC | Status: DC | PRN
  Administered 2024-04-01: 1 [drp] via OPHTHALMIC

## 2024-04-01 MED ORDER — MIDAZOLAM HCL 2 MG/2ML IJ SOLN
INTRAMUSCULAR | Status: AC
  Filled 2024-04-01: qty 2

## 2024-04-01 MED ORDER — LACTATED RINGERS IV SOLN: INTRAVENOUS | Status: DC

## 2024-04-01 MED ORDER — SIGHTPATH DOSE#1 BSS IO SOLN
INTRAOCULAR | Status: DC | PRN
  Administered 2024-04-01: 15 mL via INTRAOCULAR

## 2024-04-01 MED ORDER — ARMC OPHTHALMIC DILATING DROPS
1.0000 | OPHTHALMIC | Status: DC | PRN
  Administered 2024-04-01 (×3): 1 via OPHTHALMIC

## 2024-04-01 MED ORDER — LIDOCAINE HCL (PF) 2 % IJ SOLN
INTRAMUSCULAR | Status: DC | PRN
  Administered 2024-04-01: 2 mL

## 2024-04-01 MED ORDER — ARMC OPHTHALMIC DILATING DROPS
OPHTHALMIC | Status: AC
  Filled 2024-04-01: qty 0.5

## 2024-04-01 MED ORDER — TETRACAINE HCL 0.5 % OP SOLN
OPHTHALMIC | Status: AC
  Filled 2024-04-01: qty 4

## 2024-04-01 MED ORDER — MOXIFLOXACIN HCL 0.5 % OP SOLN
OPHTHALMIC | Status: DC | PRN
  Administered 2024-04-01: .2 mL via OPHTHALMIC

## 2024-04-01 MED ORDER — TETRACAINE HCL 0.5 % OP SOLN
1.0000 [drp] | OPHTHALMIC | Status: DC | PRN
  Administered 2024-04-01 (×3): 1 [drp] via OPHTHALMIC

## 2024-04-01 MED ORDER — FENTANYL CITRATE (PF) 100 MCG/2ML IJ SOLN
INTRAMUSCULAR | Status: AC
  Filled 2024-04-01: qty 2

## 2024-04-01 MED ORDER — MIDAZOLAM HCL 2 MG/2ML IJ SOLN
INTRAMUSCULAR | Status: DC | PRN
  Administered 2024-04-01 (×2): 1 mg via INTRAVENOUS

## 2024-04-01 MED ORDER — FENTANYL CITRATE (PF) 100 MCG/2ML IJ SOLN
INTRAMUSCULAR | Status: DC | PRN
  Administered 2024-04-01: 50 ug via INTRAVENOUS

## 2024-04-01 MED ORDER — SIGHTPATH DOSE#1 NA HYALUR & NA CHOND-NA HYALUR IO KIT
PACK | INTRAOCULAR | Status: DC | PRN
  Administered 2024-04-01: 1 via OPHTHALMIC

## 2024-04-01 MED ORDER — SIGHTPATH DOSE#1 BSS IO SOLN
INTRAOCULAR | Status: DC | PRN
  Administered 2024-04-01: 82 mL via OPHTHALMIC

## 2024-04-01 SURGICAL SUPPLY — 9 items
CATARACT SUITE SIGHTPATH (MISCELLANEOUS) ×1 IMPLANT
FEE CATARACT SUITE SIGHTPATH (MISCELLANEOUS) ×2 IMPLANT
GLOVE BIOGEL PI IND STRL 8 (GLOVE) ×2 IMPLANT
GLOVE PI ULTRA LF STRL 7.5 (GLOVE) IMPLANT
GLOVE SURG SYN 6.5 PF PI BL (GLOVE) ×2 IMPLANT
LENS IOL TECNIS EYHANCE 20.5 (Intraocular Lens) IMPLANT
NDL FILTER BLUNT 18X1 1/2 (NEEDLE) ×2 IMPLANT
NEEDLE FILTER BLUNT 18X1 1/2 (NEEDLE) ×1 IMPLANT
SYR 3ML LL SCALE MARK (SYRINGE) ×2 IMPLANT

## 2024-04-01 NOTE — Anesthesia Postprocedure Evaluation (Signed)
 Anesthesia Post Note  Patient: [name]  Procedure(s) Performed: PHACOEMULSIFICATION, CATARACT, WITH IOL INSERTION 7.10, 00:43.0 (Left)  Patient location during evaluation: PACU Anesthesia Type: MAC Level of consciousness: awake and alert Pain management: pain level controlled Vital Signs Assessment: post-procedure vital signs reviewed and stable Respiratory status: spontaneous breathing, nonlabored ventilation, respiratory function stable and patient connected to nasal cannula oxygen Cardiovascular status: stable and blood pressure returned to baseline Postop Assessment: no apparent nausea or vomiting Anesthetic complications: no   No notable events documented.   Last Vitals:  Vitals:   04/01/24 1028 04/01/24 1033  BP: (!) 160/54 (!) 150/66  Pulse: 60 63  Resp: 12 18  Temp: 36.6 C 36.6 C  SpO2: 99% 99%    Last Pain:  Vitals:   04/01/24 1033  TempSrc:   PainSc: 0-No pain                 Yaqueline Gutter C Ulisses Vondrak

## 2024-04-01 NOTE — Op Note (Signed)
 OPERATIVE NOTE  Samantha [name] [id number] 04/01/2024   PREOPERATIVE DIAGNOSIS:  Nuclear sclerotic cataract left eye. H25.12   POSTOPERATIVE DIAGNOSIS:    Nuclear sclerotic cataract left eye.     PROCEDURE:  Phacoemusification with posterior chamber intraocular lens placement of the left eye  Ultrasound time: Procedure(s): PHACOEMULSIFICATION, CATARACT, WITH IOL INSERTION 7.10, 00:43.0 (Left)  LENS:   Implant Name Type Inv. Item Serial No. Manufacturer Lot No. LRB No. Used Action  LENS IOL TECNIS EYHANCE 20.5 - [id number] Intraocular Lens LENS IOL TECNIS EYHANCE 20.5 [id number] [employer]  Left 1 Implanted      SURGEON:  Dene FABIENE Etienne, MD   ANESTHESIA:  Topical with tetracaine  drops and 2% Xylocaine  jelly, augmented with 1% preservative-free intracameral lidocaine .    COMPLICATIONS:  None.   DESCRIPTION OF PROCEDURE:  The patient was identified in the holding room and transported to the operating room and placed in the supine position under the operating microscope.  The left eye was identified as the operative eye and it was prepped and draped in the usual sterile ophthalmic fashion.   A 1 millimeter clear-corneal paracentesis was made at the 1:30 position.  0.5 ml of preservative-free 1% lidocaine  was injected into the anterior chamber.  The anterior chamber was filled with Viscoat viscoelastic.  A 2.4 millimeter keratome was used to make a near-clear corneal incision at the 10:30 position.  .  A curvilinear capsulorrhexis was made with a cystotome and capsulorrhexis forceps.  Balanced salt solution was used to hydrodissect and hydrodelineate the nucleus.   Phacoemulsification was then used in stop and chop fashion to remove the lens nucleus and epinucleus.  The remaining cortex was then removed using the irrigation and aspiration handpiece. Provisc was then placed into the capsular bag to distend it for lens placement.  A lens was then injected into the capsular bag.  The  remaining viscoelastic was aspirated.   Wounds were hydrated with balanced salt solution.  The anterior chamber was inflated to a physiologic pressure with balanced salt solution.  No wound leaks were noted. Vigamox  0.2 ml of a 1mg  per ml solution was injected into the anterior chamber for a dose of 0.2 mg of intracameral antibiotic at the completion of the case.   Timolol  and Brimonidine  drops were applied to the eye.  The patient was taken to the recovery room in stable condition without complications of anesthesia or surgery.  [name] 04/01/2024, 10:27 AM

## 2024-04-01 NOTE — H&P (Signed)
 [facility]   Primary Care Physician:  Cleatus Arlyss RAMAN, MD Ophthalmologist: Dr. Dene Etienne  Pre-Procedure History & Physical: HPI:  Samantha [name] is a 78 y.o. female here for ophthalmic surgery.   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 Authorizing Provider  Azelastine  HCl 0.15 % SOLN 2 puffs each nostril twice daily as needed 12/24/23  Yes Young, Clinton D, MD  betamethasone , augmented, (DIPROLENE ) 0.05 % lotion as needed (itching).   Yes [provider]  bifidobacterium infantis (ALIGN) capsule Take 1 capsule by mouth daily.   Yes [provider]  budesonide -formoterol  (SYMBICORT ) 160-4.5 MCG/ACT inhaler Inhale 2 puffs then rinse mouth, twice daily 12/24/23  Yes Young, Clinton D, MD  cetirizine  (ZYRTEC ) 10 MG tablet Take 1 tablet (10 mg total) by mouth at bedtime. 05/31/23  Yes Young, Reggy D, MD  Cholecalciferol (VITAMIN D ) 50 MCG (2000 UT) CAPS Take 1 capsule by mouth daily.   Yes [provider]  cromolyn  (NASALCROM ) 5.2 MG/ACT nasal spray Place 1 spray into both nostrils 4 (four) times daily. 12/24/23  Yes Neysa Reggy D, MD  diltiazem  (CARDIZEM ) 30 MG tablet May take every 8 hours as needed for chest discomfort 04/11/23  Yes Chandrasekhar, Mahesh A, MD  Ginger 500 MG CAPS Take 500 mg by mouth daily.   Yes [provider]  hydrochlorothiazide  (HYDRODIURIL ) 12.5 MG tablet TAKE 1 TABLET BY MOUTH DAILY 12/30/23  Yes Cleatus Arlyss RAMAN, MD  levalbuterol  (XOPENEX  HFA) 45 MCG/ACT inhaler Inhale 1-2 puffs into the lungs every 6 (six) hours as needed for wheezing. 12/24/23  Yes Young, Reggy D, MD  levalbuterol  (XOPENEX ) 1.25 MG/3ML nebulizer solution Take 1.25 mg by nebulization every 6 (six) hours as needed for wheezing. 10/21/23  Yes Young, Reggy D, MD  losartan  (COZAAR ) 50 MG tablet Take 1 tablet (50 mg total) by mouth daily. 12/30/23  Yes Cleatus Arlyss RAMAN, MD  MAGNESIUM MALATE PO Take by mouth daily.   Yes [provider]  Manganese 50 MG TABS Take 50 mg by mouth at bedtime.    Yes [provider]  mometasone  (ELOCON ) 0.1 % lotion as needed (otc irritation).   Yes [provider]  montelukast  (SINGULAIR ) 10 MG tablet 1 daily 08/26/23  Yes Young, Reggy D, MD  Olopatadine  HCl (PATADAY ) 0.2 % SOLN Place 1 drop into both eyes daily as needed (itchy eyes). 04/23/19  Yes Cleatus Arlyss RAMAN, MD  pantoprazole  (PROTONIX ) 40 MG tablet  Take 1 tablet (40 mg total) by mouth 2 (two) times daily. DX K21.9 and K58.9 03/26/23  Yes Cleatus Arlyss RAMAN, MD  Polyethyl Glycol-Propyl Glycol (SYSTANE) 0.4-0.3 % GEL ophthalmic gel Place 1 application into both eyes every 6 (six) hours as needed (dry eyes).   Yes [provider]  TART CHERRY PO Take by mouth. Take 1 tablet one time a day by mouth   Yes [provider]  thiamine (VITAMIN B-1) 50 MG tablet Take 100 mg by mouth daily.   Yes [provider]  TURMERIC PO Take 1 tablet by mouth daily.    Yes [provider]    Allergies as of 03/12/2024 - Review Complete 01/31/2024  Allergen Reaction Noted   2,4-d dimethylamine  11/23/2015   Albuterol   12/19/2021   Cefuroxime axetil  07/15/2008   Doxycycline  Itching 01/10/2018   Iodinated contrast media Other (See Comments) and Itching 06/17/2020   Latex Itching 01/30/2019   Lisinopril  03/04/2020   Lovastatin Other (See Comments) 10/14/2017   Sulfonamide derivatives Other (See Comments) 12/24/2023    Family History  Problem Relation Age of Onset   Diabetes Mother    Hypertension Mother    Glaucoma Mother    Polymyositis Mother    Heart disease Father        CHF   Allergic rhinitis Father    Asthma Father    Other Father        sideoblastic anemia   Allergic rhinitis Sister    Asthma Sister    Allergic rhinitis Brother    Asthma Brother    Prostate cancer Brother    Sleep apnea Brother    Allergic rhinitis Maternal Aunt    Asthma Maternal Aunt    Allergic rhinitis Paternal Aunt    Asthma Paternal Aunt    Colon cancer Cousin    Breast cancer Cousin    Cancer Neg Hx        No FH of Colon Cancer   Angioedema Neg Hx    Atopy Neg Hx    Immunodeficiency Neg Hx    Urticaria Neg Hx    Eczema Neg Hx     Social History   Socioeconomic History   Marital status: Widowed    Spouse name: Not on file   Number of children: 3   Years of education: some college   Highest education level: Not on file  Occupational History    Employer: RETIRED    Comment: Retired Journalist, newspaper)  Tobacco Use   Smoking status: Never    Passive exposure: Never   Smokeless tobacco: Never  Vaping Use   Vaping status: Never Used  Substance and Sexual Activity   Alcohol use: No   Drug use: No   Sexual activity: Never  Other Topics Concern   Not on file  Social History Narrative   Lives alone (widowed 1998).   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      2 sons and 1 daughter.        Right-handed.   No daily caffeine  use.   Social Drivers of Corporate investment banker Strain: Low Risk  (12/23/2023)   Overall Financial Resource Strain (CARDIA)    Difficulty of Paying Living Expenses: Not hard at all  Food Insecurity: No Food Insecurity (12/23/2023)   Hunger Vital Sign    Worried About Running Out of Food in the Last Year: Never true    Ran Out of Food in  the Last Year: Never true  Transportation Needs: No Transportation Needs (12/23/2023)   PRAPARE - Administrator, Civil Service (Medical): No    Lack of Transportation (Non-Medical): No  Physical Activity: Inactive (12/23/2023)   Exercise Vital Sign    Days of Exercise per Week: 0 days    Minutes of Exercise per Session: 0 min  Stress: No Stress Concern Present (12/23/2023)   [employer] of Occupational Health - Occupational Stress Questionnaire    Feeling of Stress : Not at all  Social Connections: Moderately Isolated (12/23/2023)   Social Connection and Isolation Panel    Frequency of Communication with Friends and Family: More than three times a week    Frequency of Social Gatherings with Friends and Family: Twice a week    Attends Religious Services: More than 4 times per year    Active Member of [employer] or Organizations: No    Attends Banker Meetings: Never    Marital Status: Widowed  Intimate Partner Violence: Not At Risk (12/23/2023)   Humiliation, Afraid, Rape, and Kick questionnaire    Fear of Current or Ex-Partner: No    Emotionally Abused: No    Physically Abused: No    Sexually Abused: No    Review of Systems: See HPI, otherwise negative ROS  Physical Exam: BP (!) 167/59   Pulse 64   Temp 97.6 F (36.4 C) (Temporal)   Resp 10   Ht 5' 2 (1.575 m)   Wt 60.8 kg   SpO2 100%   BMI 24.51 kg/m  General:   Alert,  pleasant and cooperative in NAD Head:  Normocephalic and atraumatic. Lungs:  Clear to auscultation.    Heart:  Regular rate and rhythm.   Impression/Plan: Rock JONETTA Silvan is here for  ophthalmic surgery.  Risks, benefits, limitations, and alternatives regarding ophthalmic surgery have been reviewed with the patient.  Questions have been answered.  All parties agreeable.   MITTIE GASKIN, MD  04/01/2024, 9:19 AM

## 2024-04-01 NOTE — Anesthesia Preprocedure Evaluation (Signed)
 Anesthesia Evaluation  Patient identified by MRN, date of birth, ID band Patient awake    Reviewed: Allergy  & Precautions, H&P , NPO status , Patient's Chart, lab work & pertinent test results  Airway Mallampati: III  TM Distance: <3 FB Neck ROM: Full    Dental no notable dental hx.  One cap lower left molar, intact Permanent bridge upper, no chipped teeth noted:   Pulmonary neg pulmonary ROS, asthma , sleep apnea    Pulmonary exam normal breath sounds clear to auscultation       Cardiovascular hypertension, negative cardio ROS Normal cardiovascular exam Rhythm:Regular Rate:Normal     Neuro/Psych  Headaches  Neuromuscular disease negative neurological ROS  negative psych ROS   GI/Hepatic negative GI ROS, Neg liver ROS,GERD  ,,  Endo/Other  negative endocrine ROS    Renal/GU negative Renal ROS  negative genitourinary   Musculoskeletal negative musculoskeletal ROS (+) Arthritis ,  Fibromyalgia -  Abdominal   Peds negative pediatric ROS (+)  Hematology negative hematology ROS (+)   Anesthesia Other Findings ASTHMA  HYPERCHOLESTEROLEMIA HYPERTENSION  ALLERGIC RHINITIS INSOMNIA  FIBROIDS, UTERUS OSTEOARTHRITIS  GERD Irritable bowel syndrome  CHEST PAIN HYPERGLYCEMIA COLONIC POLYPS, HX OF ASYMPTOMATIC POSTMENOPAUSAL STATUS  RAYNAUD'S DISEASE Episcleritis  Fibromyalgia Gait abnormality  Decreased grip strength Bronchiectasis  OSA on CPAP URI (upper respiratory infection)     Reproductive/Obstetrics negative OB ROS                              Anesthesia Physical Anesthesia Plan  ASA: 3  Anesthesia Plan: MAC   Post-op Pain Management:    Induction: Intravenous  PONV Risk Score and Plan:   Airway Management Planned: Natural Airway and Nasal Cannula  Additional Equipment:   Intra-op Plan:   Post-operative Plan:   Informed Consent: I have reviewed the patients  History and Physical, chart, labs and discussed the procedure including the risks, benefits and alternatives for the proposed anesthesia with the patient or authorized representative who has indicated his/her understanding and acceptance.     Dental Advisory Given  Plan Discussed with: Anesthesiologist, CRNA and Surgeon  Anesthesia Plan Comments: (Patient consented for risks of anesthesia including but not limited to:  - adverse reactions to medications - damage to eyes, teeth, lips or other oral mucosa - nerve damage due to positioning  - sore throat or hoarseness - Damage to heart, brain, nerves, lungs, other parts of body or loss of life  Patient voiced understanding and assent.)        Anesthesia Quick Evaluation

## 2024-04-01 NOTE — Transfer of Care (Signed)
 Immediate Anesthesia Transfer of Care Note  Patient: Samantha [name]  Procedure(s) Performed: PHACOEMULSIFICATION, CATARACT, WITH IOL INSERTION 7.10, 00:43.0 (Left)  Patient Location: PACU  Anesthesia Type: MAC  Level of Consciousness: awake, alert  and patient cooperative  Airway and Oxygen Therapy: Patient Spontanous Breathing and Patient connected to supplemental oxygen  Post-op Assessment: Post-op Vital signs reviewed, Patient's Cardiovascular Status Stable, Respiratory Function Stable, Patent Airway and No signs of Nausea or vomiting  Post-op Vital Signs: Reviewed and stable  Complications: No notable events documented.

## 2024-04-02 DIAGNOSIS — H2511 Age-related nuclear cataract, right eye: Secondary | ICD-10-CM | POA: Diagnosis not present

## 2024-04-08 NOTE — Telephone Encounter (Signed)
 Please call pulmonary and ask for input from Dr. Neysa.  Thanks.

## 2024-04-09 NOTE — Telephone Encounter (Signed)
 Reached out to pulmonary and left message for Dr. Neysa to reach out to Dr. Cleatus

## 2024-04-14 NOTE — Telephone Encounter (Signed)
 Have you heard anything back because I have not received anything

## 2024-04-15 NOTE — Telephone Encounter (Signed)
I have not been contacted yet.

## 2024-04-15 NOTE — Telephone Encounter (Signed)
 Sent last office notes to Dr. Neysa so that Dr. Cleatus can obtain input. Awaiting response

## 2024-04-16 ENCOUNTER — Telehealth: Payer: Self-pay

## 2024-04-16 ENCOUNTER — Telehealth: Payer: Self-pay | Admitting: Internal Medicine

## 2024-04-16 MED ORDER — MONTELUKAST SODIUM 10 MG PO TABS: 10.0000 mg | ORAL_TABLET | Freq: Every day | ORAL | 3 refills | Status: AC

## 2024-04-16 NOTE — Telephone Encounter (Signed)
-----   Message from [facility] sent at 04/15/2024  3:27 PM EDT ----- Please schedule PFT  dx asthma mild intermittent, as per request from Dr Cleatus ----- Message ----- From: Trudy Davene CROME, CMA Sent: 04/15/2024   3:14 PM EDT To: Reggy JONETTA Salt, MD  Attached are the last office visit notes from patient seeing Dr. Cleatus. Dr. Cleatus would like you input about potentially updating her PFT's when she is feeling better. She had inc xopenex  use this year, after previous infection. Please advise.

## 2024-04-16 NOTE — Telephone Encounter (Signed)
 Patient would like a refill of Montelukast  (90day supply).  Pharmacy: [facility] on [address] in [place]

## 2024-04-16 NOTE — Telephone Encounter (Signed)
 PFT scheduled in [place].

## 2024-04-16 NOTE — Addendum Note (Signed)
 Addended by: MELVENIA WILFORD SAUNDERS on: 04/16/2024 08:26 AM   Modules accepted: Orders

## 2024-04-16 NOTE — Telephone Encounter (Signed)
 Order placed for pft please schedule pft

## 2024-04-16 NOTE — Telephone Encounter (Signed)
 Called patient and let her know this has been sent in for her.NFN

## 2024-04-21 NOTE — Telephone Encounter (Signed)
 I have called, sent last office notes as well. Any response?

## 2024-04-21 NOTE — Telephone Encounter (Signed)
 No response.  Thanks.

## 2024-04-22 ENCOUNTER — Encounter: Payer: Self-pay | Admitting: Anesthesiology

## 2024-04-28 NOTE — Discharge Instructions (Signed)

## 2024-04-29 ENCOUNTER — Ambulatory Visit: Admission: RE | Admit: 2024-04-29 | Source: Home / Self Care | Admitting: Ophthalmology

## 2024-04-29 SURGERY — PHACOEMULSIFICATION, CATARACT, WITH IOL INSERTION
Anesthesia: Topical | Laterality: Right

## 2024-05-05 ENCOUNTER — Other Ambulatory Visit: Payer: Self-pay

## 2024-05-08 ENCOUNTER — Ambulatory Visit (INDEPENDENT_AMBULATORY_CARE_PROVIDER_SITE_OTHER): Admitting: Family Medicine

## 2024-05-08 VITALS — BP 132/70 | HR 62 | Temp 97.8°F | Ht 62.0 in | Wt 134.2 lb

## 2024-05-08 DIAGNOSIS — J069 Acute upper respiratory infection, unspecified: Secondary | ICD-10-CM | POA: Diagnosis not present

## 2024-05-08 MED ORDER — AMOXICILLIN-POT CLAVULANATE 875-125 MG PO TABS
1.0000 | ORAL_TABLET | Freq: Two times a day (BID) | ORAL | 0 refills | Status: DC
Start: 2024-05-08 — End: 2024-06-25

## 2024-05-08 MED ORDER — PREDNISOLON-MOXIFLOX-BROMFENAC 1-0.5-0.075 % OP SOLN: 1.0000 [drp] | Freq: Every evening | OPHTHALMIC | Status: AC

## 2024-05-08 NOTE — Progress Notes (Signed)
  Pt complains of cough, scratchy/sore throat, and congestion that started Monday. Pt has taken COVID test twice, both neg. Pt has been exposed to someone who has COVID.  No body aches, fever, or chills.   Negative covid test today at OV.   Voice is altered.  Some chest congestion.  She doesn't feel profoundly unwell but is fatigued.  She may be getting some better already.  Some thick white sputum.  No discolored sputum.    Meds, vitals, and allergies reviewed.   ROS: Per HPI unless specifically indicated in ROS section   Nad Ncat Neck supple, no LA Stuffy nose, clear rhinorrhea.  OP wnl.  MMM TM wnl B.  L max and frontal area ttp.   Rrr Ctab Abd soft, not ttp Skin well perfused.

## 2024-05-08 NOTE — Patient Instructions (Signed)
 Keep taking your regular meds and if you continue to have facial pain then start augmentin .  Take care.  Glad to see you.

## 2024-05-11 NOTE — Telephone Encounter (Signed)
 Please try to contact pulmonary again.  Thanks.

## 2024-05-11 NOTE — Assessment & Plan Note (Signed)
 Covid neg.  Okay for outpatient f/u. She may already be feeling some better in the meantime.  Continue baseline medication and hold Augmentin  for now.  If she continues to have left maxillary and frontal area tenderness then she could start that.  She may improve in the next few days without needing that medication.  Supportive care otherwise.  Update us  as needed.  She agrees with plan.

## 2024-05-12 NOTE — Telephone Encounter (Signed)
 Called and spoke with Dr. Neysa CMA, per Dr. Neysa PFT were ordered and patient has been scheduled to complete that and for an OV with Dr. Neysa in October.

## 2024-05-12 NOTE — Telephone Encounter (Signed)
 I didn't see that.  Many thanks.

## 2024-05-12 NOTE — Telephone Encounter (Signed)
 Dr Cleatus office called back regarding below message. Caller was told that message has been taken care. Patient was scheduled for PFT per Dr Saundra request NFN.  Dr. Cleatus would like you input about potentially updating her PFT's when she is feeling better. She had inc xopenex  use this year, after previous infection. Please advise.

## 2024-05-21 DIAGNOSIS — Z01419 Encounter for gynecological examination (general) (routine) without abnormal findings: Secondary | ICD-10-CM | POA: Diagnosis not present

## 2024-05-21 DIAGNOSIS — Z6824 Body mass index (BMI) 24.0-24.9, adult: Secondary | ICD-10-CM | POA: Diagnosis not present

## 2024-05-26 ENCOUNTER — Other Ambulatory Visit: Payer: Self-pay | Admitting: Internal Medicine

## 2024-06-12 DIAGNOSIS — M8588 Other specified disorders of bone density and structure, other site: Secondary | ICD-10-CM | POA: Diagnosis not present

## 2024-06-12 LAB — HM DEXA SCAN

## 2024-06-16 DIAGNOSIS — R1032 Left lower quadrant pain: Secondary | ICD-10-CM | POA: Diagnosis not present

## 2024-06-18 DIAGNOSIS — R1031 Right lower quadrant pain: Secondary | ICD-10-CM | POA: Diagnosis not present

## 2024-06-18 DIAGNOSIS — R1032 Left lower quadrant pain: Secondary | ICD-10-CM | POA: Diagnosis not present

## 2024-06-18 DIAGNOSIS — K219 Gastro-esophageal reflux disease without esophagitis: Secondary | ICD-10-CM | POA: Diagnosis not present

## 2024-06-18 DIAGNOSIS — K5900 Constipation, unspecified: Secondary | ICD-10-CM | POA: Diagnosis not present

## 2024-06-24 ENCOUNTER — Ambulatory Visit

## 2024-06-24 DIAGNOSIS — J4541 Moderate persistent asthma with (acute) exacerbation: Secondary | ICD-10-CM | POA: Diagnosis not present

## 2024-06-24 LAB — PULMONARY FUNCTION TEST
DL/VA % pred: 121 %
DL/VA: 5.08 ml/min/mmHg/L
DLCO unc % pred: 108 %
DLCO unc: 18.85 ml/min/mmHg
FEF 25-75 Post: 1.92 L/s
FEF 25-75 Pre: 2.01 L/s
FEF2575-%Change-Post: -4 %
FEF2575-%Pred-Post: 137 %
FEF2575-%Pred-Pre: 143 %
FEV1-%Change-Post: -1 %
FEV1-%Pred-Post: 100 %
FEV1-%Pred-Pre: 102 %
FEV1-Post: 1.84 L
FEV1-Pre: 1.87 L
FEV1FVC-%Change-Post: 0 %
FEV1FVC-%Pred-Pre: 109 %
FEV6-%Change-Post: -1 %
FEV6-%Pred-Post: 97 %
FEV6-%Pred-Pre: 98 %
FEV6-Post: 2.26 L
FEV6-Pre: 2.29 L
FEV6FVC-%Pred-Post: 105 %
FEV6FVC-%Pred-Pre: 105 %
FVC-%Change-Post: -1 %
FVC-%Pred-Post: 92 %
FVC-%Pred-Pre: 93 %
FVC-Post: 2.26 L
FVC-Pre: 2.29 L
Post FEV1/FVC ratio: 82 %
Post FEV6/FVC ratio: 100 %
Pre FEV1/FVC ratio: 81 %
Pre FEV6/FVC Ratio: 100 %
RV % pred: 89 %
RV: 2.01 L
TLC % pred: 90 %
TLC: 4.31 L

## 2024-06-24 NOTE — Progress Notes (Signed)
 Patient ID: Samantha [name], female    DOB: 05-11-1946, 78 y.o.   MRN: [id number]  HPI female never smoker followed for allergic rhinitis, asthma, bronchiectasis,  complicated by GERD/ LPR, Raynaud's, HBP, IBS, Glaucoma , Aortic Atherosclerosis,  Barium swallow 01/09/2016-normal PFT 06/05/2019- Mild restriction, normal flows and Diffusion Lab 04/10/21- EOS wnl, IgE 15 wnl HST 05/09/23- AHI 11.4/hr, desat to 89%, body weight 132 lbs PFT 06/24/24- Normal pulmonary function --------------------------------------------------------------------------------------    12/24/23-78 year old female never smoker followed for Allergic Rhinitis, Asthma, Bronchiectasis, mild OSA/ failed CPAP, complicated by GERD/ LPR, Raynaud's, HBP, IBS, Glaucoma - azelastine  nasal, Nasalcrom , Qnasl , Symbicort  160, Xopenex  hfa, Neb Xopenex , singulair , Zyrtec , Protonix , -Carafate , CPAP auto 5-15/ [facility]    ordered 05/31/23   D/C'd 08/2023 Body weight today 134 lbs -----Feeling better since first of the year from pneumonia.  Energy and breathing is improved. Discussed the use of AI scribe software for clinical note transcription with the patient, who gave verbal consent to proceed. History of Present Illness   The patient, with a history of mild sleep apnea and fibromyalgia, presents after a recent bout of sinus infection and walking pneumonia. She reports some sensitivity to pollen and dust, and has noticed an increase in the use of her inhaler since her pneumonia. She also mentions some discomfort in her ribs when taking deep breaths, which she attributes to possible arthritis in her spine.  The patient also has a dental bridge and has been experiencing some shifting and gum recession. She is not a good candidate for an oral appliance for OSA.  In addition, the patient has been struggling with congestion in her chest, which she describes as feeling like 'sticky glue.' She has been unable to find her preferred over-the-counter  rhinitis medication, cromolyn , at her local pharmacies in [place]. I suggested she check with her pharmacist..    CXR 10/02/23 MPRESSION: No active cardiopulmonary disease.   Assessment and Plan:    Sinusitis and Walking Pneumonia Chronic Asthmatic Bronchitis mild persistent Recurrent sinusitis and walking pneumonia with improved breathing post-pneumonia. Increased inhaler use post-pneumonia, expected improvement with seasonal change. - Continue current inhaler regimen. - Adjust inhaler use based on symptoms.  Allergic Rhinitis Allergic rhinitis with irritation from pollen and dust. Uses azelastine  solution; previous supply issues with mail order. - Send azelastine  prescription to [facility]. - Provide printed prescription for local pharmacy if needed. -Discus Nasalcrom  availability with her pharmacist  Mild Obstructive Sleep Apnea Mild obstructive sleep apnea with no significant cardiac impact. CPAP not used; dental appliance not suitable due to dental bridge. - No current intervention.  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 Overlapping symptoms complicate differentiation. Stretching exercises like Pilates or physical therapy suggested. - Consider referral to physical therapy for stretching exercises.  Medication Management Requires refills for Xopenex  inhaler, azelastine  solution, and other respiratory medications. Cromolyn  unavailable in inhaler form; nasal spray option discussed. - Send prescriptions for Xopenex  inhaler and azelastine  solution to [facility]. - Provide printed prescription for azelastine  solution. - Advise inquiry about cromolyn  nasal spray at local pharmacies. - Print prescription for cromolyn  nasal spray for local pharmacy.  Follow-up Follow-up scheduled in six months, adjustable as needed. - Schedule follow-up appointment in six months.       06/25/24-   78 year old female never smoker followed for Allergic  Rhinitis, Asthma, Bronchiectasis, mild OSA/ failed CPAP, complicated by GERD/ LPR, Raynaud's, HBP, IBS, Glaucoma - azelastine  nasal, Nasalcrom , Qnasl , Symbicort  160, Xopenex  hfa, Neb Xopenex , singulair ,  Zyrtec , Protonix , -Carafate , Body weight today -136 lbs PFT 06/24/24- Normal pulmonary function Discussed the use of AI scribe software for clinical note transcription with the patient, who gave verbal consent to proceed.  History of Present Illness   Samantha [name] is a 78 year old female who presents for a follow-up visit for pulmonary issues.  She uses Xopenex  less frequently, primarily when walking, which causes shortness of breath and chest pain. The chest pain is sometimes accompanied by a rapid heartbeat. Diltiazem  helps alleviate the chest pain, but she does not take it regularly due to the number of medications she is on. Her medication list includes treatments for fibromyalgia and vitamins. She does not require refills for her breathing medications at this time. PFT was normal.   Cardiology follows her for exertional chest pains     Assessment and Plan:    Exertional dyspnea Normal pulmonary function tests, no significant impairment. - Continue current management. - No need to refill breathing medications unless she prefers t have them..  Exertional and nocturnal chest pain Chest pain relieved by diltiazem , associated with exertion and occasionally nocturnal. - Continue diltiazem  as needed. -Follow with cardiology  Transition of care Discussed provider's retirement and transition of care, preference for local provider. - Schedule follow-up with [facility] pulmonologist in six months.     Review of Systems-see HPI   + = positive Constitutional:   No weight loss, night sweats,  Fevers, chills, fatigue, lassitude. HEENT:   No headaches,  Difficulty swallowing,  Tooth/dental problems,  Sore throat,                No sneezing, itching, or ear ache,   nasal congestion, post  nasal drip, CV:  + chest pain, orthopnea, PND, swelling in lower extremities, anasarca, dizziness, palpitations GI  No heartburn, indigestion, abdominal pain, nausea, vomiting,  Resp: No shortness of breath with exertion or at rest.  No excess mucus, +productive cough,  +-non-productive cough,  No coughing up of blood.  No change in color of mucus.  +infrequent wheezing.   Skin: Clear GU:  MS:  + joint pain or swelling.   Psych:  No change in mood or affect. No depression or anxiety.  No memory loss.   Objective:   Physical Exam General- Alert, Oriented, Affect-appropriate, Distress- none acute; pleasant  Skin- clear Lymphadenopathy- none Head- atraumatic            Eyes- Gross vision intact, PERRLA, conjunctivae clear secretions,            Ears- Normal hearing            Nose- clear, no-Septal dev, mucus, polyps, erosion, perforation .                        Throat- Malampatti III.   TMs not  retracted , mucosa not red, drainage- none, tonsils- atrophic;  +dental bridge, Neck- flexible , trachea midline, no stridor , thyroid  nl, carotid no bruit Chest - symmetrical excursion , unlabo           Heart/CV- RRR , no murmur , no gallop  , no rub, nl s1 s2                           - JVD- none , edema-none, stasis changes- none, varices- none           Lung-  +crackles, cough- , dullness-none, rub- none,  crackles- none           Chest wall-  Abd-  Br/ Gen/ Rectal- Not done, not indicated Extrem- cyanosis- none, clubbing, none, atrophy- none, strength- nl.  Neuro- grossly intact to observation

## 2024-06-24 NOTE — Patient Instructions (Signed)
 Full PFT completed today ? ?

## 2024-06-24 NOTE — Progress Notes (Signed)
 Full PFT completed today ? ?

## 2024-06-25 ENCOUNTER — Encounter: Payer: Self-pay | Admitting: Internal Medicine

## 2024-06-25 ENCOUNTER — Ambulatory Visit: Admitting: Internal Medicine

## 2024-06-25 VITALS — BP 136/62 | HR 68 | Temp 97.8°F | Ht 61.5 in | Wt 136.8 lb

## 2024-06-25 DIAGNOSIS — J4541 Moderate persistent asthma with (acute) exacerbation: Secondary | ICD-10-CM | POA: Diagnosis not present

## 2024-06-25 MED ORDER — AEROCHAMBER MV MISC: 2 refills | Status: AC

## 2024-06-25 NOTE — Patient Instructions (Addendum)
 You are doing great. We can refill your breathing meds when needed.  I mentioned that you might like Dr Tamea at [facility] office, or Dr Donzetta or Dr Aisha at this office, since that is so much closer for you.

## 2024-06-29 ENCOUNTER — Ambulatory Visit: Payer: Self-pay | Admitting: Family Medicine

## 2024-07-01 ENCOUNTER — Telehealth: Payer: Self-pay | Admitting: Family Medicine

## 2024-07-01 NOTE — Telephone Encounter (Signed)
 Noted. Thanks.

## 2024-07-01 NOTE — Telephone Encounter (Signed)
 Called pt and pt stated that she just want to make appt not talk too a triage nurse

## 2024-07-01 NOTE — Telephone Encounter (Signed)
 Copied from CRM [id number]. Topic: Clinical - Refused Triage >> Jul 01, 2024  1:26 PM Donna BRAVO wrote: Patient/caller voiced complaints of patient is asthmatic and has cough sometimes short of breath, congestion, head pressure.  Patient refused to speak with nurse triage, patient wants to schedule appt only   Declined transfer to triage.

## 2024-07-01 NOTE — Telephone Encounter (Signed)
 Pt is scheduled tomorrow at 11:20 w/ Tabitha. Fyi to Dr Cleatus.

## 2024-07-02 ENCOUNTER — Ambulatory Visit (INDEPENDENT_AMBULATORY_CARE_PROVIDER_SITE_OTHER): Admitting: Family

## 2024-07-02 VITALS — BP 150/84 | HR 76 | Temp 98.4°F | Wt 138.4 lb

## 2024-07-02 DIAGNOSIS — Z20822 Contact with and (suspected) exposure to covid-19: Secondary | ICD-10-CM | POA: Diagnosis not present

## 2024-07-02 DIAGNOSIS — J0100 Acute maxillary sinusitis, unspecified: Secondary | ICD-10-CM

## 2024-07-02 DIAGNOSIS — J029 Acute pharyngitis, unspecified: Secondary | ICD-10-CM

## 2024-07-02 LAB — POC COVID19 BINAXNOW: SARS Coronavirus 2 Ag: NEGATIVE

## 2024-07-02 LAB — POCT RAPID STREP A (OFFICE): Rapid Strep A Screen: NEGATIVE

## 2024-07-02 MED ORDER — AMOXICILLIN-POT CLAVULANATE 875-125 MG PO TABS: 1.0000 | ORAL_TABLET | Freq: Two times a day (BID) | ORAL | 0 refills | Status: DC

## 2024-07-02 NOTE — Progress Notes (Signed)
 Established Patient Office Visit  Subjective:      CC:  Chief Complaint  Patient presents with   Acute Visit    Reports shortness of breath, congestion, head pressure x5 days. Denies fever, body aches, chills.    HPI: Samantha [name] is a 78 y.o. female presenting on 07/02/2024 for Acute Visit (Reports shortness of breath, congestion, head pressure x5 days. Denies fever, body aches, chills.) .  Discussed the use of AI scribe software for clinical note transcription with the patient, who gave verbal consent to proceed.  History of Present Illness Samantha [name] is a 78 year old female with asthma and bronchiectasis who presents with worsening sinus congestion and head pressure.  She has been experiencing significant sinus congestion and head pressure for the past five to six days, with symptoms including nasal congestion, irritation, and pressure in the ears, leading to frontal headaches. The patient reports that her symptoms started getting worse recently.  She also has fibromyalgia and reports that when her chest hurts, it can be hard to tell if it is from asthma or fibromyalgia, as her rheumatologist has told her. She has been taking over-the-counter medications including Zocor, Tylenol Cold and Flu, and Hepcet, which have provided some relief. She has a history of antibiotic use, including Augmentin  and Z-Pak, and cannot take doxycycline  or sulfa medications like Bactrim. She has tolerated Augmentin  well in the past.  She underwent cataract surgery on June 23, and her eye has been bothering her since then, although she is under the care of another doctor for that issue. She is not on any immunosuppressants for her fibromyalgia or arthritis.  In the review of symptoms, she reports no current  wheezing but has increased her inhaler use since becoming sick. Her throat feels irritated, and she experiences significant drainage down her throat. She notes that she knows of people being sick around her, and mentions that her cousin had COVID, which took her a long time to recover from.         Social history:  Relevant past medical, surgical, family and social history reviewed and updated as indicated. Interim medical history since our last visit reviewed.  Allergies and medications reviewed and updated.  DATA REVIEWED: CHART IN EPIC     ROS: Negative unless specifically indicated above in HPI.    Current Outpatient Medications:    amoxicillin -clavulanate (AUGMENTIN ) 875-125 MG tablet, Take 1 tablet by mouth 2 (two) times daily., Disp: 20 tablet, Rfl: 0   Azelastine  HCl 0.15 % SOLN, 2 puffs each nostril twice daily as needed, Disp: 90 mL, Rfl: 3   betamethasone , augmented, (DIPROLENE ) 0.05 % lotion, as needed (itching)., Disp: , Rfl:    bifidobacterium infantis (ALIGN) capsule, Take 1 capsule by mouth daily., Disp: , Rfl:    budesonide -formoterol  (SYMBICORT ) 160-4.5 MCG/ACT inhaler, Inhale 2 puffs then rinse mouth, twice daily, Disp: 3 each, Rfl: 4   cetirizine  (ZYRTEC ) 10 MG tablet, Take 1 tablet (10 mg total) by mouth at bedtime., Disp: 90 tablet, Rfl: 4   Cholecalciferol (VITAMIN D ) 50 MCG (2000 UT) CAPS, Take 1 capsule by mouth daily., Disp: , Rfl:    cromolyn  (NASALCROM ) 5.2 MG/ACT nasal spray, Place 1 spray into both nostrils 4 (four) times daily., Disp: 26 mL, Rfl: 12   diltiazem  (CARDIZEM ) 30 MG tablet, MAY TAKE EVERY 8 HOURS BY MOUTH AS NEEDED FOR CHEST DISCOMFORT, Disp: 30 tablet, Rfl: 0   dorzolamide (TRUSOPT) 2 % ophthalmic solution, Place 1 drop into  the left eye 2 (two) times daily., Disp: , Rfl:    Ginger 500 MG CAPS, Take 500 mg by mouth daily., Disp: , Rfl:    hydrochlorothiazide  (HYDRODIURIL ) 12.5 MG tablet, TAKE 1 TABLET BY MOUTH DAILY, Disp: 90 tablet, Rfl:  3   levalbuterol  (XOPENEX  HFA) 45 MCG/ACT inhaler, Inhale 1-2 puffs into the lungs every 6 (six) hours as needed for wheezing., Disp: 3 each, Rfl: 3   levalbuterol  (XOPENEX ) 1.25 MG/3ML nebulizer solution, Take 1.25 mg by nebulization every 6 (six) hours as needed for wheezing., Disp: 125 mL, Rfl: 12   losartan  (COZAAR ) 50 MG tablet, Take 1 tablet (50 mg total) by mouth daily., Disp: 90 tablet, Rfl: 3   MAGNESIUM MALATE PO, Take by mouth daily., Disp: , Rfl:    Manganese 50 MG TABS, Take 50 mg by mouth at bedtime. , Disp: , Rfl:    mometasone  (ELOCON ) 0.1 % lotion, as needed (otc irritation)., Disp: , Rfl:    montelukast  (SINGULAIR ) 10 MG tablet, Take 1 tablet (10 mg total) by mouth at bedtime., Disp: 90 tablet, Rfl: 3   Olopatadine  HCl (PATADAY ) 0.2 % SOLN, Place 1 drop into both eyes daily as needed (itchy eyes)., Disp: , Rfl:    pantoprazole  (PROTONIX ) 40 MG tablet, Take 1 tablet (40 mg total) by mouth 2 (two) times daily. DX K21.9 and K58.9, Disp: 180 tablet, Rfl: 1   Polyethyl Glycol-Propyl Glycol (SYSTANE) 0.4-0.3 % GEL ophthalmic gel, Place 1 application into both eyes every 6 (six) hours as needed (dry eyes)., Disp: , Rfl:    Prednisolon-Moxiflox-Bromfenac  1-0.5-0.075 % SOLN, Place 1 drop into the left eye at bedtime., Disp: , Rfl:    Spacer/Aero-Holding Chambers (AEROCHAMBER MV) inhaler, Use as instructed, Disp: 1 each, Rfl: 2   TART CHERRY PO, Take by mouth. Take 1 tablet one time a day by mouth, Disp: , Rfl:    thiamine (VITAMIN B-1) 50 MG tablet, Take 100 mg by mouth daily., Disp: , Rfl:    TURMERIC PO, Take 1 tablet by mouth daily., Disp: , Rfl:         Objective:        BP (!) 150/84 (BP Location: Left Arm, Patient Position: Sitting, Cuff Size: Normal)   Pulse 76   Temp 98.4 F (36.9 C) (Temporal)   Wt 138 lb 6.4 oz (62.8 kg)   SpO2 99%   BMI 25.73 kg/m   Physical Exam HEENT: Throat erythematous. Sinus tenderness. CHEST: Lungs clear to auscultation, no  wheezing.  Wt Readings from Last 3 Encounters:  07/02/24 138 lb 6.4 oz (62.8 kg)  06/25/24 136 lb 12.8 oz (62.1 kg)  06/24/24 136 lb 3.2 oz (61.8 kg)    Physical Exam Constitutional:      General: She is not in acute distress.    Appearance: Normal appearance. She is normal weight. She is not ill-appearing, toxic-appearing or diaphoretic.  HENT:     Head: Normocephalic.     Right Ear: Tympanic membrane normal.     Left Ear: Tympanic membrane normal.     Nose:     Right Sinus: Maxillary sinus tenderness present.     Left Sinus: Maxillary sinus tenderness present.     Mouth/Throat:     Mouth: Mucous membranes are dry.     Pharynx: Posterior oropharyngeal erythema and postnasal drip present. No oropharyngeal exudate.     Tonsils: 1+ on the left.  Eyes:     Extraocular Movements: Extraocular movements intact.  Pupils: Pupils are equal, round, and reactive to light.  Cardiovascular:     Rate and Rhythm: Normal rate and regular rhythm.     Pulses: Normal pulses.     Heart sounds: Normal heart sounds.  Pulmonary:     Effort: Pulmonary effort is normal.     Breath sounds: Normal breath sounds.  Musculoskeletal:     Cervical back: Normal range of motion.  Neurological:     General: No focal deficit present.     Mental Status: She is alert and oriented to person, place, and time. Mental status is at baseline.  Psychiatric:        Mood and Affect: Mood normal.        Behavior: Behavior normal.        Thought Content: Thought content normal.        Judgment: Judgment normal.          Results   Assessment & Plan:   Assessment and Plan Assessment & Plan Acute sinusitis and acute pharyngitis Symptoms began five days ago with significant sinus head pressure, congestion, and frontal headache. Tenderness in the sinus area and throat redness indicate acute sinusitis and pharyngitis. Worsening symptoms over six days suggest bacterial rather than viral infection.  - Administer  Augmentin  for bacterial sinusitis. -rx augmentin  875/125 mg po bid x 10 days Take antibiotic as prescribed. Increase oral fluids. Pt to f/u if sx worsen and or fail to improve in 2-3 days.   Asthma and bronchiectasis Asthma and bronchiectasis can exacerbate during respiratory infections. No current wheezing on examination. Increased inhaler use since onset of current illness.  Fibromyalgia Fibromyalgia complicates the clinical picture by causing diffuse pain, making differentiation from other sources of pain, such as asthma-related chest pain, difficult.       Return for f/u PCP if no improvement in symptoms.     Ginger Patrick, MSN, APRN, FNP-C [facility]

## 2024-07-27 ENCOUNTER — Ambulatory Visit: Payer: Self-pay | Admitting: *Deleted

## 2024-07-27 NOTE — Telephone Encounter (Signed)
 FYI Only or Action Required?: FYI only for provider: appointment scheduled on 11/17.  Patient was last seen in primary care on 07/02/2024 by Corwin Antu, FNP.  Called Nurse Triage reporting Cough.  Symptoms began several weeks ago.  Interventions attempted: Other: Patient had Augmentin  Rx- she finished Saturday- reports did not help very much.  Symptoms are: unchanged.  Triage Disposition: See Physician Within 24 Hours  Patient/caregiver understands and will follow disposition?: Yes  Copied from CRM #[id number]. Topic: Clinical - Red Word Triage >> Jul 27, 2024  8:27 AM Treva T wrote: [facility] that prompted transfer to Nurse Triage: Pt calling reports she has lost her voice, hoarseness, chest pain for past couple days, but not at present, productive cough, coughing up thick white, foamy mucous, has also had some shortness of breath. Reason for Disposition  [1] Continuous (nonstop) coughing interferes with work or school AND [2] no improvement using cough treatment per Care Advice  Answer Assessment - Initial Assessment Questions 1. ONSET: When did the cough begin?      Started over 1 week, 11/4- patient did have Augmentin  Rx which she took- reports did not help a lot.  2. SEVERITY: How bad is the cough today?      Not bad now- comes and goes 3. SPUTUM: Describe the color of your sputum (e.g., none, dry cough; clear, white, yellow, green)     Thick white foamy 4. HEMOPTYSIS: Are you coughing up any blood? If Yes, ask: How much? (e.g., flecks, streaks, tablespoons, etc.)     no 5. DIFFICULTY BREATHING: Are you having difficulty breathing? If Yes, ask: How bad is it? (e.g., mild, moderate, severe)      Sometimes SOB- exertion- inhaler helps 6. FEVER: Do you have a fever? If Yes, ask: What is your temperature, how was it measured, and when did it start?     no 7. CARDIAC HISTORY: Do you have any history of heart disease? (e.g., heart attack, congestive heart  failure)      Hypertension  8. LUNG HISTORY: Do you have any history of lung disease?  (e.g., pulmonary embolus, asthma, emphysema)     Asthma, bronchiectasis  9. PE RISK FACTORS: Do you have a history of blood clots? (or: recent major surgery, recent prolonged travel, bedridden)     no 10. OTHER SYMPTOMS: Do you have any other symptoms? (e.g., runny nose, wheezing, chest pain)       Chest pain- with beginning symptoms  Protocols used: Cough - Acute Productive-A-AH

## 2024-07-27 NOTE — Telephone Encounter (Signed)
 NOTED

## 2024-07-28 ENCOUNTER — Encounter: Payer: Self-pay | Admitting: Family

## 2024-07-28 ENCOUNTER — Telehealth: Payer: Self-pay

## 2024-07-28 ENCOUNTER — Ambulatory Visit: Admitting: Family

## 2024-07-28 ENCOUNTER — Ambulatory Visit: Payer: Self-pay | Admitting: Family

## 2024-07-28 VITALS — BP 152/72 | HR 70 | Temp 98.2°F | Ht 62.0 in | Wt 138.2 lb

## 2024-07-28 DIAGNOSIS — R5383 Other fatigue: Secondary | ICD-10-CM | POA: Diagnosis not present

## 2024-07-28 DIAGNOSIS — R0609 Other forms of dyspnea: Secondary | ICD-10-CM | POA: Diagnosis not present

## 2024-07-28 DIAGNOSIS — R051 Acute cough: Secondary | ICD-10-CM

## 2024-07-28 DIAGNOSIS — B999 Unspecified infectious disease: Secondary | ICD-10-CM | POA: Diagnosis not present

## 2024-07-28 LAB — BRAIN NATRIURETIC PEPTIDE: Pro B Natriuretic peptide (BNP): 14 pg/mL (ref 0.0–100.0)

## 2024-07-28 LAB — CBC WITH DIFFERENTIAL/PLATELET
Basophils Absolute: 0 K/uL (ref 0.0–0.1)
Basophils Relative: 0.5 % (ref 0.0–3.0)
Eosinophils Absolute: 0 K/uL (ref 0.0–0.7)
Eosinophils Relative: 0.8 % (ref 0.0–5.0)
HCT: 36.5 % (ref 36.0–46.0)
Hemoglobin: 12.9 g/dL (ref 12.0–15.0)
Lymphocytes Relative: 38.4 % (ref 12.0–46.0)
Lymphs Abs: 2.1 K/uL (ref 0.7–4.0)
MCHC: 35.2 g/dL (ref 30.0–36.0)
MCV: 85.3 fl (ref 78.0–100.0)
Monocytes Absolute: 0.4 K/uL (ref 0.1–1.0)
Monocytes Relative: 7.2 % (ref 3.0–12.0)
Neutro Abs: 3 K/uL (ref 1.4–7.7)
Neutrophils Relative %: 53.1 % (ref 43.0–77.0)
Platelets: 185 K/uL (ref 150.0–400.0)
RBC: 4.27 Mil/uL (ref 3.87–5.11)
RDW: 13.8 % (ref 11.5–15.5)
WBC: 5.6 K/uL (ref 4.0–10.5)

## 2024-07-28 LAB — BASIC METABOLIC PANEL WITH GFR
BUN: 16 mg/dL (ref 6–23)
CO2: 30 meq/L (ref 19–32)
Calcium: 10 mg/dL (ref 8.4–10.5)
Chloride: 103 meq/L (ref 96–112)
Creatinine, Ser: 0.94 mg/dL (ref 0.40–1.20)
GFR: 57.93 mL/min — ABNORMAL LOW (ref >60.00)
Glucose, Bld: 115 mg/dL — ABNORMAL HIGH (ref 70–99)
Potassium: 3.6 meq/L (ref 3.5–5.1)
Sodium: 139 meq/L (ref 135–145)

## 2024-07-28 MED ORDER — PREDNISONE 20 MG PO TABS: ORAL_TABLET | ORAL | 0 refills | Status: DC

## 2024-07-28 NOTE — Telephone Encounter (Signed)
 Copied from CRM #[id number]. Topic: Clinical - Medication Question >> Jul 28, 2024 12:48 PM Victoria A wrote: Reason for CRM: Patient would like to know if Dr.Duncan can prescribe Magic Mouthwash for her-please call

## 2024-07-28 NOTE — Telephone Encounter (Signed)
 Called patient left message to call office.   Patient was seen in office today. Need to find out why she needs and if still needed.   Please provide message from provider/office when call is returned from patient.

## 2024-07-28 NOTE — Progress Notes (Signed)
 Established Patient Office Visit  Subjective:      CC:  Chief Complaint  Patient presents with   Acute Visit    Productive cough and congestion for several weeks. Was seen back on 07/02/24 for the same symptoms.    HPI: Samantha [name] is a 78 y.o. female presenting on 07/28/2024 for Acute Visit (Productive cough and congestion for several weeks. Was seen back on 07/02/24 for the same symptoms.) .  Discussed the use of AI scribe software for clinical note transcription with the patient, who gave verbal consent to proceed.  History of Present Illness Samantha [name] is a 78 year old female with bronchiectasis and asthma who presents with persistent sinus congestion and chest discomfort.  Approximately two weeks ago, she was treated with Augmentin  for a sore throat, which initially improved her symptoms. However, after completing the course, her symptoms worsened. She took an additional round of Augmentin  with minimal relief. Currently, she has ongoing congestion and a sore throat that has improved. No wheezing is present, but she experiences chest tightness and discomfort, sometimes radiating to her back.  She experiences increased shortness of breath, especially when walking short distances, which is more pronounced than usual. Swelling in her ankles and legs occurs, particularly in the evenings, and has been a recurring issue. Her chest discomfort sometimes wakes her at night, and she is unsure whether to use her inhaler or diltiazem  for relief. She takes Zyrtec  nightly for allergies.  During the review of symptoms, she reports no significant sinus pressure currently, but some tenderness in the facial area. She denies significant wheezing but notes chest pain and tightness. She also experiences shortness of breath when lying down.   Wt Readings from Last 3 Encounters:  07/28/24 138 lb 3.2 oz (62.7 kg)  07/02/24 138 lb 6.4 oz (62.8 kg)  06/25/24 136 lb 12.8 oz (62.1 kg)           Social history:  Relevant past medical, surgical, family and social history reviewed and updated as indicated. Interim medical history since our last visit reviewed.  Allergies and medications reviewed and updated.  DATA REVIEWED: CHART IN EPIC     ROS: Negative unless specifically indicated above in HPI.    Current Outpatient Medications:    Azelastine  HCl 0.15 % SOLN, 2 puffs each nostril twice daily as needed, Disp: 90 mL, Rfl: 3   betamethasone , augmented, (DIPROLENE ) 0.05 % lotion, as needed (itching)., Disp: , Rfl:    bifidobacterium infantis (ALIGN) capsule, Take 1 capsule by mouth daily., Disp: , Rfl:    budesonide -formoterol  (SYMBICORT ) 160-4.5 MCG/ACT inhaler, Inhale 2 puffs then rinse mouth, twice daily, Disp: 3 each, Rfl: 4   cetirizine  (ZYRTEC ) 10 MG tablet, Take 1 tablet (10 mg total) by mouth at bedtime., Disp: 90 tablet, Rfl: 4   Cholecalciferol (VITAMIN D ) 50 MCG (2000 UT) CAPS, Take 1 capsule by mouth daily., Disp: , Rfl:    cromolyn  (NASALCROM ) 5.2 MG/ACT nasal spray, Place 1 spray into both nostrils 4 (four) times daily., Disp: 26 mL, Rfl: 12   diltiazem  (CARDIZEM ) 30 MG tablet, MAY TAKE EVERY 8 HOURS BY MOUTH AS NEEDED FOR CHEST DISCOMFORT, Disp: 30 tablet, Rfl: 0   dorzolamide (TRUSOPT) 2 % ophthalmic solution, Place 1 drop into the left eye 2 (two) times daily., Disp: , Rfl:    Ginger 500 MG CAPS, Take 500 mg by mouth daily., Disp: , Rfl:    hydrochlorothiazide  (HYDRODIURIL ) 12.5 MG tablet, TAKE 1 TABLET BY  MOUTH DAILY, Disp: 90 tablet, Rfl: 3   levalbuterol  (XOPENEX  HFA) 45 MCG/ACT inhaler, Inhale 1-2 puffs into the lungs every 6 (six) hours as needed for wheezing., Disp: 3 each, Rfl: 3   levalbuterol  (XOPENEX ) 1.25 MG/3ML nebulizer solution, Take 1.25 mg by nebulization every 6 (six) hours as needed for wheezing., Disp: 125 mL, Rfl: 12   losartan  (COZAAR ) 50 MG tablet, Take 1 tablet (50 mg total) by mouth daily., Disp: 90 tablet, Rfl: 3    MAGNESIUM MALATE PO, Take by mouth daily., Disp: , Rfl:    Manganese 50 MG TABS, Take 50 mg by mouth at bedtime. , Disp: , Rfl:    mometasone  (ELOCON ) 0.1 % lotion, as needed (otc irritation)., Disp: , Rfl:    montelukast  (SINGULAIR ) 10 MG tablet, Take 1 tablet (10 mg total) by mouth at bedtime., Disp: 90 tablet, Rfl: 3   Olopatadine  HCl (PATADAY ) 0.2 % SOLN, Place 1 drop into both eyes daily as needed (itchy eyes)., Disp: , Rfl:    pantoprazole  (PROTONIX ) 40 MG tablet, Take 1 tablet (40 mg total) by mouth 2 (two) times daily. DX K21.9 and K58.9, Disp: 180 tablet, Rfl: 1   Polyethyl Glycol-Propyl Glycol (SYSTANE) 0.4-0.3 % GEL ophthalmic gel, Place 1 application into both eyes every 6 (six) hours as needed (dry eyes)., Disp: , Rfl:    Prednisolon-Moxiflox-Bromfenac  1-0.5-0.075 % SOLN, Place 1 drop into the left eye at bedtime., Disp: , Rfl:    Spacer/Aero-Holding Chambers (AEROCHAMBER MV) inhaler, Use as instructed, Disp: 1 each, Rfl: 2   TART CHERRY PO, Take by mouth. Take 1 tablet one time a day by mouth, Disp: , Rfl:    thiamine (VITAMIN B-1) 50 MG tablet, Take 100 mg by mouth daily., Disp: , Rfl:    TURMERIC PO, Take 1 tablet by mouth daily., Disp: , Rfl:         Objective:        BP (!) 152/72 (BP Location: Left Arm, Patient Position: Sitting, Cuff Size: Large)   Pulse 70   Temp 98.2 F (36.8 C) (Temporal)   Ht 5' 2 (1.575 m)   Wt 138 lb 3.2 oz (62.7 kg)   SpO2 96%   BMI 25.28 kg/m   Physical Exam HEENT: Throat slightly erythematous. EXTREMITIES: No ankle swelling.  Wt Readings from Last 3 Encounters:  07/28/24 138 lb 3.2 oz (62.7 kg)  07/02/24 138 lb 6.4 oz (62.8 kg)  06/25/24 136 lb 12.8 oz (62.1 kg)    Physical Exam Vitals reviewed.  Constitutional:      General: She is not in acute distress.    Appearance: Normal appearance. She is normal weight. She is not ill-appearing, toxic-appearing or diaphoretic.  HENT:     Head: Normocephalic.     Right Ear: Tympanic  membrane normal.     Left Ear: Tympanic membrane normal.     Nose:     Left Sinus: Maxillary sinus tenderness present.     Mouth/Throat:     Mouth: Mucous membranes are dry.     Pharynx: Posterior oropharyngeal erythema and postnasal drip present. No oropharyngeal exudate.  Eyes:     Extraocular Movements: Extraocular movements intact.     Pupils: Pupils are equal, round, and reactive to light.  Cardiovascular:     Rate and Rhythm: Normal rate and regular rhythm.     Pulses: Normal pulses.     Heart sounds: Normal heart sounds.  Pulmonary:     Effort: Pulmonary effort is normal.  Breath sounds: Normal breath sounds.  Musculoskeletal:     Cervical back: Normal range of motion.  Neurological:     General: No focal deficit present.     Mental Status: She is alert and oriented to person, place, and time. Mental status is at baseline.  Psychiatric:        Mood and Affect: Mood normal.        Behavior: Behavior normal.        Thought Content: Thought content normal.        Judgment: Judgment normal.          Results   Assessment & Plan:   Assessment and Plan Assessment & Plan Acute sinusitis and pharyngitis with persistent symptoms Persistent sinus congestion, sore throat, and sinus pressure despite two rounds of Augmentin . Sore throat has improved, but sinus pressure persists intermittently. No wheezing, but chest tightness and pain are present. Differential includes lingering infection or other causes. - Ordered labs to rule out other infectious processes - Ordered stat BNP to evaluate cardiac function - Will consider prednisone  pending lab results  Shortness of breath, chest pain, and lower extremity edema for cardiac and pulmonary evaluation Shortness of breath, chest pain, and lower extremity edema, particularly in the evenings. Symptoms may be related to cardiac issues, given her history of heart conditions. No significant swelling currently, but symptoms worsen in  the evening. Differential includes cardiac causes versus other pulmonary issues. - Ordered labs to rule out cardiac causes - Evaluated for potential cardiac involvement  Bronchiectasis and asthma Intermittent chest pain and shortness of breath, possibly related to bronchiectasis and asthma. No wheezing noted. Symptoms may be exacerbated by other conditions such as fibromyalgia. - Continue current asthma and bronchiectasis management - Will consider prednisone  if labs are negative for other causes  Fibromyalgia Chronic condition contributing to chest pain and overall discomfort. Symptoms may overlap with other conditions, complicating treatment.        Return for f/u PCP if no improvement in symptoms.     Ginger Patrick, MSN, APRN, FNP-C [facility]

## 2024-07-28 NOTE — Telephone Encounter (Signed)
 Pt called and stated that she needs the mouthwash because when she takes antibiotics it makes her mouth dry.

## 2024-07-29 MED ORDER — NYSTATIN 100000 UNIT/ML MT SUSP: 5.0000 mL | Freq: Four times a day (QID) | OROMUCOSAL | 0 refills | Status: AC

## 2024-07-29 NOTE — Telephone Encounter (Signed)
 Sent. Thanks.

## 2024-07-29 NOTE — Addendum Note (Signed)
 Addended by: CLEATUS ARLYSS RAMAN on: 07/29/2024 02:22 PM   Modules accepted: Orders

## 2024-07-30 NOTE — Telephone Encounter (Signed)
 Noted

## 2024-08-10 NOTE — Progress Notes (Deleted)
 Office Visit Note  Patient: Samantha [name]             Date of Birth: 1946/03/03           MRN: [id number]             PCP: Cleatus Arlyss RAMAN, MD Referring: Cleatus Arlyss RAMAN, MD Visit Date: 08/20/2024 Occupation: Data Unavailable  Subjective:  No chief complaint on file.   History of Present Illness: Samantha [name] is a 78 y.o. female ***     Activities of Daily Living:  Patient reports morning stiffness for *** {minute/hour:19697}.   Patient {ACTIONS;DENIES/REPORTS:21021675::Denies} nocturnal pain.  Difficulty dressing/grooming: {ACTIONS;DENIES/REPORTS:21021675::Denies} Difficulty climbing stairs: {ACTIONS;DENIES/REPORTS:21021675::Denies} Difficulty getting out of chair: {ACTIONS;DENIES/REPORTS:21021675::Denies} Difficulty using hands for taps, buttons, cutlery, and/or writing: {ACTIONS;DENIES/REPORTS:21021675::Denies}  No Rheumatology ROS completed.   PMFS History:  Patient Active Problem List   Diagnosis Date Noted   SOB (shortness of breath) 09/26/2023   Complaints of total body pain 06/05/2023   OSA (obstructive sleep apnea) 05/31/2023   Snoring 01/03/2023   Cognitive impairment 05/07/2022   Other fatigue 05/07/2022   Fatigue 01/08/2021   DNR (do not resuscitate) 07/24/2020   Tinea versicolor 05/30/2020   Myalgia 12/24/2019   Bronchiectasis without complication (HCC) 08/27/2019   Dysphagia 07/23/2019   Nonintractable headache 03/19/2019   Paresthesia 03/19/2019   Decreased grip strength 02/04/2019   Local superficial swelling 02/02/2019   Weight loss 02/02/2019   Osteopenia 08/09/2018   Chronic rhinitis 07/23/2018   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   Moderate persistent asthma with acute exacerbation 01/30/2018   Advance care planning 01/02/2018   Vitamin D  deficiency 07/08/2017   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/19/2017   Primary osteoarthritis of both knees 01/19/2017   Primary  osteoarthritis of both feet 01/19/2017   Deviated septum 12/07/2016   Dyspnea on exertion 11/14/2016   Diverticulosis 03/19/2016   Seasonal and perennial allergic rhinitis 01/18/2016   Lumbar compression fracture (HCC) 06/20/2015   Fibromyalgia 02/26/2013   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   IRRITABLE BOWEL SYNDROME 04/06/2010   THYROID  NODULE, RIGHT 12/07/2009   OSTEOARTHRITIS 11/28/2009   Chest pain of uncertain etiology 08/26/2008   FIBROIDS, UTERUS 05/10/2008   Insomnia 05/10/2008   ASYMPTOMATIC POSTMENOPAUSAL STATUS 05/10/2008   Essential hypertension 01/28/2008   HYPERCHOLESTEROLEMIA 01/07/2008   GLAUCOMA 01/07/2008   Gastroesophageal reflux disease 04/14/2007   Asthma 04/14/2007    Past Medical History:  Diagnosis Date   ALLERGIC RHINITIS 07/26/2008   ASTHMA 04/14/2007   ASYMPTOMATIC POSTMENOPAUSAL STATUS 05/10/2008   Bronchiectasis (HCC)    CHEST PAIN 08/26/2008   COLONIC POLYPS, HX OF 04/14/2007   colonic leiomyoma   Decreased grip strength    Episcleritis    FIBROIDS, UTERUS 05/10/2008   Fibromyalgia    Gait abnormality    GERD 04/14/2007   HYPERCHOLESTEROLEMIA 01/07/2008   HYPERGLYCEMIA 11/28/2009   HYPERTENSION 01/28/2008   INSOMNIA 05/10/2008   Irritable bowel syndrome 04/06/2010   OSA on CPAP    OSTEOARTHRITIS 11/28/2009   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   URI (upper respiratory infection) 08/23/2016    Family History  Problem Relation Age of Onset   Diabetes Mother    Hypertension Mother    Glaucoma Mother    Polymyositis Mother    Heart disease Father        CHF   Allergic rhinitis Father    Asthma Father    Other Father        sideoblastic anemia  Allergic rhinitis Sister    Asthma Sister    Allergic rhinitis Brother    Asthma Brother    Prostate cancer Brother    Sleep apnea Brother    Allergic rhinitis Maternal Aunt    Asthma Maternal Aunt    Allergic rhinitis Paternal Aunt    Asthma Paternal Aunt    Colon cancer Cousin    Breast  cancer Cousin    Cancer Neg Hx        No FH of Colon Cancer   Angioedema Neg Hx    Atopy Neg Hx    Immunodeficiency Neg Hx    Urticaria Neg Hx    Eczema Neg Hx    Past Surgical History:  Procedure Laterality Date   CATARACT EXTRACTION W/PHACO Left 04/01/2024   Procedure: PHACOEMULSIFICATION, CATARACT, WITH IOL INSERTION 7.10, 00:43.0;  Surgeon: Mittie Gaskin, MD;  Location: [facility];  Service: Ophthalmology;  Laterality: Left;   COLONOSCOPY W/ POLYPECTOMY     ELECTROCARDIOGRAM  12/04/2006   IR RADIOLOGY PERIPHERAL GUIDED IV START  04/21/2018   IR US  GUIDE VASC ACCESS LEFT  04/21/2018   NASAL SEPTUM SURGERY     Stress Cardiolite  02/13/2002   Social History   Tobacco Use   Smoking status: Never    Passive exposure: Never   Smokeless tobacco: Never  Vaping Use   Vaping status: Never Used  Substance Use Topics   Alcohol use: No   Drug use: No   Social History   Social History Narrative   Lives alone (widowed 1998).   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      2 sons and 1 daughter.        Right-handed.   No daily caffeine use.     Immunization History  Administered Date(s) Administered   Fluad Quad(high Dose 65+) 05/07/2019, 05/24/2020, 05/19/2021, 05/15/2022   Fluad Trivalent(High Dose 65+) 05/06/2023   INFLUENZA, HIGH DOSE SEASONAL PF 05/22/2017, 06/11/2018, 05/06/2023, 06/02/2024   Influenza Split 06/04/2011, 05/27/2012   Influenza Whole 08/18/2008, 05/26/2010   Influenza,inj,Quad PF,6+ Mos 06/08/2013, 05/19/2014, 06/08/2015, 05/07/2016   Influenza-Unspecified 05/17/2020   PFIZER Comirnaty(Gray Top)Covid-19 Tri-Sucrose Vaccine 10/11/2020   PFIZER(Purple Top)SARS-COV-2 Vaccination 10/27/2019, 11/17/2019, 10/11/2020   Pfizer Covid-19 Vaccine Bivalent Booster 75yrs & up 12/21/2021   Pneumococcal Conjugate-13 03/17/2014   Pneumococcal Polysaccharide-23 11/23/2009, 06/08/2015, 07/14/2018   Td 11/09/2002   Tdap 07/03/2013, 01/03/2024    Zoster Recombinant(Shingrix ) 02/19/2019, 08/27/2019   Zoster, Live 06/17/2013     Objective: Vital Signs: There were no vitals taken for this visit.   Physical Exam   Musculoskeletal Exam: ***  CDAI Exam: CDAI Score: -- Patient Global: --; Provider Global: -- Swollen: --; Tender: -- Joint Exam 08/20/2024   No joint exam has been documented for this visit   There is currently no information documented on the homunculus. Go to the Rheumatology activity and complete the homunculus joint exam.  Investigation: No additional findings.  Imaging: No results found.  Recent Labs: Lab Results  Component Value Date   WBC 5.6 07/28/2024   HGB 12.9 07/28/2024   PLT 185.0 07/28/2024   NA 139 07/28/2024   K 3.6 07/28/2024   CL 103 07/28/2024   CO2 30 07/28/2024   GLUCOSE 115 (H) 07/28/2024   BUN 16 07/28/2024   CREATININE 0.94 07/28/2024   BILITOT 0.6 12/25/2023   ALKPHOS 70 12/25/2023   AST 17 12/25/2023   ALT 9 12/25/2023   PROT 6.8 12/25/2023   ALBUMIN 4.6  12/25/2023   CALCIUM 10.0 07/28/2024   GFRAA >60 03/10/2019    Speciality Comments: No specialty comments available.  Procedures:  No procedures performed Allergies: 2,4-d dimethylamine; Albuterol ; Cefuroxime axetil; Doxycycline ; Iodinated contrast media; Latex; Lisinopril; Lovastatin; and Sulfonamide derivatives   Assessment / Plan:     Visit Diagnoses: No diagnosis found.  Orders: No orders of the defined types were placed in this encounter.  No orders of the defined types were placed in this encounter.   Face-to-face time spent with patient was *** minutes. Greater than 50% of time was spent in counseling and coordination of care.  Follow-Up Instructions: No follow-ups on file.   Daved JAYSON Gavel, CMA  Note - This record has been created using Animal nutritionist.  Chart creation errors have been sought, but may not always  have been located. Such creation errors do not reflect on  the standard of  medical care.

## 2024-08-20 ENCOUNTER — Ambulatory Visit: Admitting: Rheumatology

## 2024-08-20 DIAGNOSIS — M170 Bilateral primary osteoarthritis of knee: Secondary | ICD-10-CM

## 2024-08-20 DIAGNOSIS — R5383 Other fatigue: Secondary | ICD-10-CM

## 2024-08-20 DIAGNOSIS — G4709 Other insomnia: Secondary | ICD-10-CM

## 2024-08-20 DIAGNOSIS — Z8669 Personal history of other diseases of the nervous system and sense organs: Secondary | ICD-10-CM

## 2024-08-20 DIAGNOSIS — Z87442 Personal history of urinary calculi: Secondary | ICD-10-CM

## 2024-08-20 DIAGNOSIS — Z8679 Personal history of other diseases of the circulatory system: Secondary | ICD-10-CM

## 2024-08-20 DIAGNOSIS — Z8709 Personal history of other diseases of the respiratory system: Secondary | ICD-10-CM

## 2024-08-20 DIAGNOSIS — M19071 Primary osteoarthritis, right ankle and foot: Secondary | ICD-10-CM

## 2024-08-20 DIAGNOSIS — M8589 Other specified disorders of bone density and structure, multiple sites: Secondary | ICD-10-CM

## 2024-08-20 DIAGNOSIS — Z8639 Personal history of other endocrine, nutritional and metabolic disease: Secondary | ICD-10-CM

## 2024-08-20 DIAGNOSIS — M19041 Primary osteoarthritis, right hand: Secondary | ICD-10-CM

## 2024-08-20 DIAGNOSIS — M160 Bilateral primary osteoarthritis of hip: Secondary | ICD-10-CM

## 2024-08-20 DIAGNOSIS — M19011 Primary osteoarthritis, right shoulder: Secondary | ICD-10-CM

## 2024-08-20 DIAGNOSIS — G5603 Carpal tunnel syndrome, bilateral upper limbs: Secondary | ICD-10-CM

## 2024-08-20 DIAGNOSIS — M797 Fibromyalgia: Secondary | ICD-10-CM

## 2024-08-20 DIAGNOSIS — M7061 Trochanteric bursitis, right hip: Secondary | ICD-10-CM

## 2024-08-20 DIAGNOSIS — Z8719 Personal history of other diseases of the digestive system: Secondary | ICD-10-CM

## 2024-08-24 ENCOUNTER — Other Ambulatory Visit: Payer: Self-pay | Admitting: Family Medicine

## 2024-08-24 ENCOUNTER — Telehealth: Payer: Self-pay | Admitting: Family Medicine

## 2024-08-24 MED ORDER — HYDROCHLOROTHIAZIDE 12.5 MG PO TABS: ORAL_TABLET | ORAL | 0 refills | Status: AC

## 2024-08-24 NOTE — Telephone Encounter (Signed)
 Copied from CRM [id number]. Topic: Clinical - Medication Refill >> Aug 24, 2024  9:48 AM Nessti S wrote: Medication: hydrochlorothiazide  (HYDRODIURIL ) 12.5 MG tablet   Has the patient contacted their pharmacy? Yes (Agent: If no, request that the patient contact the pharmacy for the refill. If patient does not wish to contact the pharmacy document the reason why and proceed with request.) (Agent: If yes, when and what did the pharmacy advise?)  This is the patient's preferred pharmacy: [id number] [place], [place] - [address] [place] [address] Phone: [phone] Fax: [phone]  Is this the correct pharmacy for this prescription? Yes If no, delete pharmacy and type the correct one.   Has the prescription been filled recently? yes  Is the patient out of the medication? Yes. She has been out for a couple days and needs it soon as possible.  Has the patient been seen for an appointment in the last year OR does the patient have an upcoming appointment? Yes  Can we respond through MyChart? No  Agent: Please be advised that Rx refills may take up to 3 business days. We ask that you follow-up with your pharmacy.

## 2024-08-24 NOTE — Telephone Encounter (Signed)
Rx has been sent in. 

## 2024-08-28 ENCOUNTER — Other Ambulatory Visit
Admission: RE | Admit: 2024-08-28 | Discharge: 2024-08-28 | Disposition: A | Discharge status: Home / Self Care | Source: Ambulatory Visit | Attending: Ophthalmology | Admitting: Ophthalmology

## 2024-08-28 DIAGNOSIS — R519 Headache, unspecified: Secondary | ICD-10-CM | POA: Diagnosis present

## 2024-08-28 LAB — CBC
HCT: 36.3 % (ref 36.0–46.0)
Hemoglobin: 12.7 g/dL (ref 12.0–15.0)
MCH: 29.4 pg (ref 26.0–34.0)
MCHC: 35 g/dL (ref 30.0–36.0)
MCV: 84 fL (ref 80.0–100.0)
Platelets: 233 K/uL (ref 150–400)
RBC: 4.32 MIL/uL (ref 3.87–5.11)
RDW: 13 % (ref 11.5–15.5)
WBC: 6 K/uL (ref 4.0–10.5)
nRBC: 0 % (ref 0.0–0.2)

## 2024-08-28 LAB — C-REACTIVE PROTEIN: CRP: <3 mg/dL — ABNORMAL HIGH

## 2024-08-28 LAB — SEDIMENTATION RATE: Sed Rate: 32 mm/h — ABNORMAL HIGH (ref 0–30)

## 2024-09-08 ENCOUNTER — Telehealth (INDEPENDENT_AMBULATORY_CARE_PROVIDER_SITE_OTHER): Payer: Self-pay

## 2024-09-08 ENCOUNTER — Encounter (INDEPENDENT_AMBULATORY_CARE_PROVIDER_SITE_OTHER): Payer: Self-pay | Admitting: Vascular Surgery

## 2024-09-08 ENCOUNTER — Ambulatory Visit (INDEPENDENT_AMBULATORY_CARE_PROVIDER_SITE_OTHER): Admitting: Vascular Surgery

## 2024-09-08 ENCOUNTER — Ambulatory Visit: Admitting: Pulmonary Disease

## 2024-09-08 ENCOUNTER — Encounter: Payer: Self-pay | Admitting: Pulmonary Disease

## 2024-09-08 VITALS — BP 142/66 | HR 56 | Temp 97.7°F | Ht 62.0 in | Wt 140.4 lb

## 2024-09-08 VITALS — BP 166/70 | HR 70 | Resp 18 | Ht 62.0 in | Wt 140.0 lb

## 2024-09-08 DIAGNOSIS — G4709 Other insomnia: Secondary | ICD-10-CM

## 2024-09-08 DIAGNOSIS — J479 Bronchiectasis, uncomplicated: Secondary | ICD-10-CM

## 2024-09-08 DIAGNOSIS — J4541 Moderate persistent asthma with (acute) exacerbation: Secondary | ICD-10-CM | POA: Diagnosis not present

## 2024-09-08 DIAGNOSIS — I10 Essential (primary) hypertension: Secondary | ICD-10-CM | POA: Diagnosis not present

## 2024-09-08 DIAGNOSIS — J310 Chronic rhinitis: Secondary | ICD-10-CM

## 2024-09-08 DIAGNOSIS — M316 Other giant cell arteritis: Secondary | ICD-10-CM | POA: Insufficient documentation

## 2024-09-08 NOTE — Patient Instructions (Signed)
 VISIT SUMMARY:  Today, we discussed the management of your respiratory conditions and other health concerns. We reviewed your history of asthma, bronchiectasis, fibromyalgia, Raynaud's phenomenon, arthritis, and giant cell arteritis. We also addressed your current medications and symptoms, including chest pain and leg swelling.  YOUR PLAN:  -GIANT CELL ARTERITIS: Giant cell arteritis is an inflammation of the blood vessels, often causing headaches, scalp tenderness, and jaw pain. You are currently on high-dose medication, which has caused fatigue. We are awaiting a biopsy to confirm the diagnosis and adjust your treatment. Please follow up in 3 months to monitor your condition and treatment response.  -ASTHMA: Asthma is a condition where your airways narrow and swell, causing breathing difficulties. You are currently managing it with Symbicort  and levalbuterol  as needed. Continue using these medications as prescribed.  -BRONCHIECTASIS: Bronchiectasis is a condition where the airways in your lungs become damaged and widened, leading to mucus build-up and infections. There were no specific changes to your management plan for this condition today, so continue with your current treatment.  INSTRUCTIONS:  Please follow up in 3 months to monitor your giant cell arteritis and treatment response. Continue using Symbicort  and levalbuterol  as needed for asthma management.

## 2024-09-08 NOTE — Patient Instructions (Signed)
 Temporal Artery Biopsy  Arteries are blood vessels that carry blood from the heart to the rest of the body. Temporal arteries are found on the side of the head and between the ears and eyes. A biopsy is when a small piece of the artery is removed for testing. A temporal artery biopsy may be done to see if you have temporal arteritis. This is a condition that causes your arteries to become swollen or inflamed. Tell a health care provider about: Any allergies you have. All medicines you are taking, including vitamins, herbs, eye drops, creams, and over-the-counter medicines. Any problems you or family members have had with anesthesia. Any bleeding problems you have. Any surgeries you have had. Any medical conditions you have or have had. Whether you are pregnant or may be pregnant. What are the risks? Your health care provider will talk with you about risks. These may include: Bleeding. Hematoma. This is a collection of blood under the skin. Infection. Nerve damage in the temples. This can cause numbness. It can also make the muscles in your face weak. Scarring. On the scalp, hair may not grow around the scar. What happens before the procedure? Medicines Ask your provider about: Changing or stopping your regular medicines. These include any diabetes medicines or blood thinners you take. Taking medicines such as aspirin and ibuprofen. These medicines can thin your blood. Do not take them unless your provider tells you to. Taking over-the-counter medicines, vitamins, herbs, and supplements. Surgery safety Ask your provider: How your surgery site will be marked. What steps will be taken to help prevent infection. These steps may include: Removing hair at the surgery site. Washing skin with a soap that kills germs. Taking antibiotics. General instructions Follow instructions from your provider about what you may eat and drink. You may need blood tests to make sure that your blood clots  like it should. If you will be going home right after the procedure, plan to have a responsible adult: Take you home from the hospital or clinic. You will not be allowed to drive. Care for you for the time you are told. What happens during the procedure? Small monitors will be put on your body. They will be used to check your heart, blood pressure, and oxygen level. An IV will be inserted into one of your veins. You may be given: A sedative. This helps you relax. Anesthesia. This keeps you from feeling pain. It will make you fall asleep for surgery. A tool called a Doppler may be used to find the artery. An incision will be made over the artery. Two clamps will be placed. A small piece of the artery between the clamps will be cut and taken out. The ends of the artery will be closed with stitches (sutures). This helps prevent bleeding. The clamps will then be removed. The incision will be closed with sutures. A bandage (dressing) may be placed on the incision. The piece of the artery that was taken out will be tested in a lab. The procedure may vary among providers and hospitals. What happens after the procedure? Your blood pressure, heart rate, breathing rate, and blood oxygen level may be monitored until you leave the hospital or clinic. This information is not intended to replace advice given to you by your health care provider. Make sure you discuss any questions you have with your health care provider. Document Revised: 06/27/2022 Document Reviewed: 06/27/2022 Elsevier Patient Education  2024 [employer].

## 2024-09-08 NOTE — H&P (View-Only) (Signed)
 "   Patient ID: Samantha [name], female   DOB: Jan 09, 1946, 78 y.o.   MRN: [id number]  Chief Complaint  Patient presents with   Establish Care    Urgent new patient possible GCA/TA biopsy. Ref. [place] pt headed to pulmonary to follow    HPI Samantha [name] is a 78 y.o. female.  I am asked to see the patient by Dr. Mittie for evaluation of left temporal headaches and visual changes concerning for giant cell arteritis.    Discussed the use of AI scribe software for clinical note transcription with the patient, who gave verbal consent to proceed.  History of Present Illness Samantha [name] is a 78 year old female with suspected giant cell arteritis who presents for evaluation of persistent temporal headaches and elevated inflammatory markers.  Her sedimentation rate was mildly elevated at 32.  Her CRP was reported as less than 3 but this is still above the less than 1 threshold for normal.  She describes several years of persistent temporal pain with visible veins and recurrent headaches, recently worsening with radiation toward the eye and sometimes the whole head. Symptoms include burning, itching, and tingling of the scalp, and periorbital discomfort that increased after recent left eye cataract surgery. Lying on her head can trigger diffuse head pain, though this has partially improved.  This has been predominantly the left side in the past but now she has symptoms on the right as well.  She was originally evaluated for temporal arteritis a few years ago but her inflammatory markers were not elevated and so the suspicion went down.  This was about three and a half years ago, lab work for temporal arteritis was normal. After her recent eye surgery, symptoms worsened and repeat labs showed elevated inflammatory markers, prompting vascular evaluation. She is on prednisone  20 mg three times daily for suspected giant cell arteritis but is worried about side effects including insomnia, fatigue, mood  changes, elevated blood pressure, and increased blood glucose. She skipped prednisone  today due to concern about side effects while driving.  Dermatology previously diagnosed scalp inflammation with severe pain, itching, burning, and tingling. She recalls a major left sided head injury in the 1980s after which the temporal veins became more prominent. She had a minor stroke two years ago with intermittent confusion and dizziness and is cautious about driving. She has chest pain followed by cardiology and uses an inhaler for asthma. She is concerned about steroid-related hyperglycemia and attributes her elevated blood pressure today to steroids and anxiety.    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   URI (upper respiratory infection) 08/23/2016    Past Surgical History:  Procedure Laterality Date   CATARACT EXTRACTION W/PHACO Left 04/01/2024   Procedure: PHACOEMULSIFICATION, CATARACT, WITH IOL INSERTION 7.10, 00:43.0;  Surgeon: Mittie Gaskin, MD;  Location: [facility];  Service: Ophthalmology;  Laterality: Left;   COLONOSCOPY  W/ POLYPECTOMY     ELECTROCARDIOGRAM  12/04/2006   IR RADIOLOGY PERIPHERAL GUIDED IV START  04/21/2018   IR US  GUIDE VASC ACCESS LEFT  04/21/2018   NASAL SEPTUM SURGERY     Stress Cardiolite  02/13/2002     Family History  Problem Relation Age of Onset   Diabetes Mother     Hypertension Mother    Glaucoma Mother    Polymyositis Mother    Heart disease Father        CHF   Allergic rhinitis Father    Asthma Father    Other Father        sideoblastic anemia   Allergic rhinitis Sister    Asthma Sister    Allergic rhinitis Brother    Asthma Brother    Prostate cancer Brother    Sleep apnea Brother    Allergic rhinitis Maternal Aunt    Asthma Maternal Aunt    Allergic rhinitis Paternal Aunt    Asthma Paternal Aunt    Colon cancer Cousin    Breast cancer Cousin    Cancer Neg Hx        No FH of Colon Cancer   Angioedema Neg Hx    Atopy Neg Hx    Immunodeficiency Neg Hx    Urticaria Neg Hx    Eczema Neg Hx       Social History[1]   Allergies[2]  Current Outpatient Medications  Medication Sig Dispense Refill   Azelastine  HCl 0.15 % SOLN 2 puffs each nostril twice daily as needed 90 mL 3   betamethasone , augmented, (DIPROLENE ) 0.05 % lotion as needed (itching).     bifidobacterium infantis (ALIGN) capsule Take 1 capsule by mouth daily.     budesonide -formoterol  (SYMBICORT ) 160-4.5 MCG/ACT inhaler Inhale 2 puffs then rinse mouth, twice daily 3 each 4   cetirizine  (ZYRTEC ) 10 MG tablet Take 1 tablet (10 mg total) by mouth at bedtime. 90 tablet 4   Cholecalciferol (VITAMIN D ) 50 MCG (2000 UT) CAPS Take 1 capsule by mouth daily.     cromolyn  (NASALCROM ) 5.2 MG/ACT nasal spray Place 1 spray into both nostrils 4 (four) times daily. 26 mL 12   diltiazem  (CARDIZEM ) 30 MG tablet MAY TAKE EVERY 8 HOURS BY MOUTH AS NEEDED FOR CHEST DISCOMFORT 30 tablet 0   dorzolamide (TRUSOPT) 2 % ophthalmic solution Place 1 drop into the left eye 2 (two) times daily.     Ginger 500 MG CAPS Take 500 mg by mouth daily.     hydrochlorothiazide  (HYDRODIURIL ) 12.5 MG tablet TAKE 1 TABLET BY MOUTH DAILY 90 tablet 0   levalbuterol  (XOPENEX  HFA) 45 MCG/ACT inhaler Inhale 1-2 puffs into the lungs every 6 (six) hours as needed for wheezing. 3 each 3   levalbuterol  (XOPENEX ) 1.25  MG/3ML nebulizer solution Take 1.25 mg by nebulization every 6 (six) hours as needed for wheezing. 125 mL 12   losartan  (COZAAR ) 50 MG tablet Take 1 tablet (50 mg total) by mouth daily. 90 tablet 3   magic mouthwash (nystatin , lidocaine , diphenhydrAMINE, alum & mag hydroxide) suspension Swish and swallow 5 mLs 4 (four) times daily. 180 mL 0   MAGNESIUM MALATE PO Take by mouth daily.     Manganese 50 MG TABS Take 50 mg by mouth at bedtime.      mometasone  (ELOCON ) 0.1 % lotion as needed (otc irritation).     montelukast  (SINGULAIR ) 10 MG tablet Take 1 tablet (10 mg total) by mouth at bedtime. 90 tablet 3  Olopatadine  HCl (PATADAY ) 0.2 % SOLN Place 1 drop into both eyes daily as needed (itchy eyes).     pantoprazole  (PROTONIX ) 40 MG tablet Take 1 tablet (40 mg total) by mouth 2 (two) times daily. DX K21.9 and K58.9 180 tablet 1   Polyethyl Glycol-Propyl Glycol (SYSTANE) 0.4-0.3 % GEL ophthalmic gel Place 1 application into both eyes every 6 (six) hours as needed (dry eyes).     Prednisolon-Moxiflox-Bromfenac  1-0.5-0.075 % SOLN Place 1 drop into the left eye at bedtime.     predniSONE  (DELTASONE ) 20 MG tablet Take two tablets once daily for five days 10 tablet 0   Spacer/Aero-Holding Chambers (AEROCHAMBER MV) inhaler Use as instructed 1 each 2   TART CHERRY PO Take by mouth. Take 1 tablet one time a day by mouth     thiamine (VITAMIN B-1) 50 MG tablet Take 100 mg by mouth daily.     TURMERIC PO Take 1 tablet by mouth daily.     No current facility-administered medications for this visit.      REVIEW OF SYSTEMS (Negative unless checked)  Constitutional: [] Weight loss  [] Fever  [] Chills Cardiac: [] Chest pain   [] Chest pressure   [] Palpitations   [] Shortness of breath when laying flat   [] Shortness of breath at rest   [] Shortness of breath with exertion. Vascular:  [] Pain in legs with walking   [] Pain in legs at rest   [] Pain in legs when laying flat   [] Claudication   [] Pain in feet when  walking  [] Pain in feet at rest  [] Pain in feet when laying flat   [] History of DVT   [] Phlebitis   [] Swelling in legs   [] Varicose veins   [] Non-healing ulcers Pulmonary:   [] Uses home oxygen   [] Productive cough   [] Hemoptysis   [] Wheeze  [] COPD   [x] Asthma Neurologic:  [] Dizziness  [] Blackouts   [] Seizures   [x] History of stroke   [] History of TIA  [] Aphasia   [] Temporary blindness   [] Dysphagia   [] Weakness or numbness in arms   [] Weakness or numbness in legs X positive for headaches Musculoskeletal:  [x] Arthritis   [] Joint swelling   [] Joint pain   [] Low back pain Hematologic:  [] Easy bruising  [] Easy bleeding   [] Hypercoagulable state   [] Anemic  [] Hepatitis Gastrointestinal:  [] Blood in stool   [] Vomiting blood  [x] Gastroesophageal reflux/heartburn   [] Abdominal pain Genitourinary:  [] Chronic kidney disease   [] Difficult urination  [] Frequent urination  [] Burning with urination   [] Hematuria Skin:  [] Rashes   [] Ulcers   [] Wounds Psychological:  [] History of anxiety   []  History of major depression.    Physical Exam BP (!) 166/70 (BP Location: Left Arm, Patient Position: Sitting, Cuff Size: Normal)   Pulse 70   Resp 18   Ht 5' 2 (1.575 m)   Wt 140 lb (63.5 kg)   BMI 25.61 kg/m  Gen:  WD/WN, NAD. Appears younger than stated age Head: [place]/AT, No temporalis wasting. Prominent temp pulse not noted. Ear/Nose/Throat: Hearing grossly intact, nares w/o erythema or drainage, oropharynx w/o Erythema/Exudate Eyes: Conjunctiva clear, sclera non-icteric  Neck: trachea midline.  No JVD.  Pulmonary:  Good air movement, respirations not labored, no use of accessory muscles  Cardiac: RRR, no JVD Vascular:  Vessel Right Left  Radial Palpable Palpable                                   Gastrointestinal:.  No masses, surgical incisions, or scars. Musculoskeletal: M/S 5/5 throughout.  Extremities without ischemic changes.  No deformity or atrophy. No edema. Neurologic: Sensation grossly  intact in extremities.  Symmetrical.  Speech is fluent. Motor exam as listed above. Psychiatric: Judgment intact, Mood & affect appropriate but slightly anxious Dermatologic: No rashes or ulcers noted.  No cellulitis or open wounds.  Physical Exam   Radiology No results found.  Labs Recent Results (from the past 2160 hours)  HM DEXA SCAN     Status: None   Collection Time: 06/12/24  3:23 PM  Result Value Ref Range   HM Dexa Scan OSTEOPENIA     Comment: ABS BY HIM  Pulmonary function test     Status: None   Collection Time: 06/24/24  9:30 AM  Result Value Ref Range   FVC-Pre 2.29 L   FVC-%Pred-Pre 93 %   FVC-Post 2.26 L   FVC-%Pred-Post 92 %   FVC-%Change-Post -1 %   FEV1-Pre 1.87 L   FEV1-%Pred-Pre 102 %   FEV1-Post 1.84 L   FEV1-%Pred-Post 100 %   FEV1-%Change-Post -1 %   FEV6-Pre 2.29 L   FEV6-%Pred-Pre 98 %   FEV6-Post 2.26 L   FEV6-%Pred-Post 97 %   FEV6-%Change-Post -1 %   Pre FEV1/FVC ratio 81 %   FEV1FVC-%Pred-Pre 109 %   Post FEV1/FVC ratio 82 %   FEV1FVC-%Change-Post 0 %   Pre FEV6/FVC Ratio 100 %   FEV6FVC-%Pred-Pre 105 %   Post FEV6/FVC ratio 100 %   FEV6FVC-%Pred-Post 105 %   FEF 25-75 Pre 2.01 L/sec   FEF2575-%Pred-Pre 143 %   FEF 25-75 Post 1.92 L/sec   FEF2575-%Pred-Post 137 %   FEF2575-%Change-Post -4 %   RV 2.01 L   RV % pred 89 %   TLC 4.31 L   TLC % pred 90 %   DLCO unc 18.85 ml/min/mmHg   DLCO unc % pred 108 %   DL/VA 4.91 ml/min/mmHg/L   DL/VA % pred 878 %  Rapid Strep A     Status: None   Collection Time: 07/02/24 11:33 AM  Result Value Ref Range   Rapid Strep A Screen Negative Negative  POC COVID-19     Status: None   Collection Time: 07/02/24 11:33 AM  Result Value Ref Range   SARS Coronavirus 2 Ag Negative Negative  CBC with Differential     Status: None   Collection Time: 07/28/24 12:22 PM  Result Value Ref Range   WBC 5.6 4.0 - 10.5 K/uL   RBC 4.27 3.87 - 5.11 Mil/uL   Hemoglobin 12.9 12.0 - 15.0 g/dL   HCT 63.4 63.9 -  53.9 %   MCV 85.3 78.0 - 100.0 fl   MCHC 35.2 30.0 - 36.0 g/dL   RDW 86.1 88.4 - 84.4 %   Platelets 185.0 150.0 - 400.0 K/uL   Neutrophils Relative % 53.1 43.0 - 77.0 %   Lymphocytes Relative 38.4 12.0 - 46.0 %   Monocytes Relative 7.2 3.0 - 12.0 %   Eosinophils Relative 0.8 0.0 - 5.0 %   Basophils Relative 0.5 0.0 - 3.0 %   Neutro Abs 3.0 1.4 - 7.7 K/uL   Lymphs Abs 2.1 0.7 - 4.0 K/uL   Monocytes Absolute 0.4 0.1 - 1.0 K/uL   Eosinophils Absolute 0.0 0.0 - 0.7 K/uL   Basophils Absolute 0.0 0.0 - 0.1 K/uL  Basic Metabolic Panel     Status: Abnormal   Collection Time: 07/28/24 12:22 PM  Result Value Ref  Range   Sodium 139 135 - 145 mEq/L   Potassium 3.6 3.5 - 5.1 mEq/L   Chloride 103 96 - 112 mEq/L   CO2 30 19 - 32 mEq/L    Comment: Elevated LDH levels may cause falsely increased CO2 results. If LDH is >2000 U/L, a positive bias of 12% is possible.   Glucose, Bld 115 (H) 70 - 99 mg/dL   BUN 16 6 - 23 mg/dL   Creatinine, Ser 9.05 0.40 - 1.20 mg/dL   GFR 42.06 (L) >39.99 mL/min    Comment: Calculated using the CKD-EPI Creatinine Equation (2021)   Calcium 10.0 8.4 - 10.5 mg/dL  Brain natriuretic peptide     Status: None   Collection Time: 07/28/24 12:22 PM  Result Value Ref Range   Pro B Natriuretic peptide (BNP) 14.0 0.0 - 100.0 pg/mL  CBC     Status: None   Collection Time: 08/28/24  9:54 AM  Result Value Ref Range   WBC 6.0 4.0 - 10.5 K/uL   RBC 4.32 3.87 - 5.11 MIL/uL   Hemoglobin 12.7 12.0 - 15.0 g/dL   HCT 63.6 63.9 - 53.9 %   MCV 84.0 80.0 - 100.0 fL   MCH 29.4 26.0 - 34.0 pg   MCHC 35.0 30.0 - 36.0 g/dL   RDW 86.9 88.4 - 84.4 %   Platelets 233 150 - 400 K/uL   nRBC 0.0 0.0 - 0.2 %    Comment: Performed at [facility], [address] [place], [place] [address]  C-reactive protein     Status: Abnormal   Collection Time: 08/28/24  9:54 AM  Result Value Ref Range   CRP <3.0 (H) <1.0 mg/dL    Comment: Performed at [facility], [address],  [place], [place] [address]  Sedimentation rate     Status: Abnormal   Collection Time: 08/28/24  9:54 AM  Result Value Ref Range   Sed Rate 32 (H) 0 - 30 mm/hr    Comment: Performed at [facility], [address] [place], [place] [address]    Assessment/Plan: Assessment & Plan Suspected giant cell arteritis Relatively high clinical suspicion for giant cell arteritis due to symptoms and elevated inflammatory markers. Temporal artery biopsy required for definitive diagnosis. - Discussed temporal artery biopsy as diagnostic gold standard. - Explained biopsy procedure: Roughly 1-inch incision anterior to ear, performed under IV sedation, closure with dissolvable sutures and Dermabond.  Plan left sided biopsy as this is the more symptomatic side. - Advised no driving or strenuous activity on procedure day; resume normal activities after 24 hours. - Instructed need for companion on procedure day. - Initiated biopsy scheduling, preapproval, and insurance coordination. - Provided reassurance on procedural safety; no preoperative clearance needed without need for general anesthesia. - Reviewed management plan: negative biopsy leads to rapid corticosteroid taper; positive biopsy requires prolonged corticosteroid therapy for at least six months with gradual taper.  Essential hypertension blood pressure control important in reducing the progression of atherosclerotic disease. On appropriate oral medications.   Insomnia Can be worsened with steroids     Margaruite Top 09/08/2024, 2:16 PM   This note was created with Dragon medical transcription system.  Any errors from dictation are unintentional.       [1]  Social History Tobacco Use   Smoking status: Never    Passive exposure: Never   Smokeless tobacco: Never  Vaping Use   Vaping status: Never Used  Substance Use Topics   Alcohol  use: No   Drug use: No  [2]  Allergies Allergen Reactions   2,4-D Dimethylamine    Albuterol       Tachycardia   Cefuroxime Axetil     REACTION: pt not sure of reaction- she can tolerate amoxil    Doxycycline  Itching   Iodinated Contrast Media Other (See Comments) and Itching   Latex Itching   Lisinopril     Cough   Lovastatin Other (See Comments)    Muscle aches    Sulfonamide Derivatives Other (See Comments)   "

## 2024-09-08 NOTE — Assessment & Plan Note (Signed)
 Can be worsened with steroids

## 2024-09-08 NOTE — Progress Notes (Signed)
 "  Subjective:    Patient ID: Samantha [name], female    DOB: [birth year], 78 y.o.   MRN: [id number]  Patient Care Team: Cleatus Arlyss RAMAN, MD as PCP - General (Family Medicine) Santo Stanly LABOR, MD as PCP - Cardiology (Cardiology) Neysa Reggy JONETTA, MD (Pulmonary Disease) Aneita Gwendlyn DASEN, MD (Inactive) (Gastroenterology) Dolphus Reiter, MD (Rheumatology) Leva Rush, MD as Consulting Physician (Obstetrics and Gynecology) Mittie Gaskin, MD as Referring Physician (Ophthalmology) Roark Rush, MD as Consulting Physician (Otolaryngology) Sheril Coy, MD as Consulting Physician (Orthopedic Surgery) Ivin Kocher, MD as Consulting Physician (Dermatology) Eloy Herring, MD as Consulting Physician (Obstetrics and Gynecology) Elicia Claw, MD as Consulting Physician (Gastroenterology) Leva Rush, MD as Consulting Physician (Obstetrics and Gynecology) Maurilio Camellia PARAS, MD as Consulting Physician (Allergy  and Immunology)  Chief Complaint  Patient presents with   Asthma    Occasional shortness of breath.     BACKGROUND: Patient is a 78 year old lifelong never smoker with a history of asthma and bronchiectasis, prior patient of Dr. Reggy Beckwith who presents to transfer care to our office after the retirement of Dr. Neysa.   HPI Discussed the use of AI scribe software for clinical note transcription with the patient, who gave verbal consent to proceed.  History of Present Illness   Samantha [name] is a 78 year old female with asthma who presents for transfer of care and management of her respiratory conditions.  She has a history of asthma, bronchiectasis, fibromyalgia, Raynaud's phenomenon, arthritis, and giant cell arteritis. She is currently maintained on Symbicort  and uses levalbuterol  as needed for asthma management. The previous rescue inhaler (albuterol ) caused chest palpitations, prompting a switch to levalbuterol .  She has been diagnosed with giant cell  arteritis by her ophthalmologist and is awaiting a biopsy procedure to further evaluate this condition. The medication for this condition previously caused hyperactivity but now results in significant fatigue, impacting her ability to drive.  She is on a very high dose of prednisone  at present.  She experiences chest pain described as 'real tight' and has been prescribed medication by her cardiologist to manage this symptom. She also reports swelling in her legs, particularly in the evenings, with specific swelling in her ankles.  Her past medical history includes a significant inflammatory component, with symptoms such as scalp itching, burning, and headaches, which were previously evaluated by a dermatologist and attributed to inflammation. Recent blood work indicated elevated inflammatory markers.     Respiratory wise, she does not endorse any significant issues over her baseline.  As noted she has been diagnosed by Dr. Reggy Neysa to have moderate persistent asthma.  Most recent PFTs were performed on 05 July 2024 and showed an FEV1 of 1.87 L or 102% of predicted, FVC of 2.29 L or 93% predicted, FEV1/FVC of 81%, no significant bronchodilator response.  Lung volumes normal, diffusion capacity normal, overall study consistent with very minimal small airways disease otherwise normal study.   Review of Systems A 10 point review of systems was performed and it is as noted above otherwise negative.   Past Medical History:  Diagnosis Date   ALLERGIC RHINITIS 07/26/2008   ASTHMA 04/14/2007   ASYMPTOMATIC POSTMENOPAUSAL STATUS 05/10/2008   Bronchiectasis (HCC)    CHEST PAIN 08/26/2008   COLONIC POLYPS, HX OF 04/14/2007   colonic leiomyoma   Decreased grip strength    Episcleritis    FIBROIDS, UTERUS 05/10/2008   Fibromyalgia    Gait abnormality    GERD 04/14/2007   HYPERCHOLESTEROLEMIA 01/07/2008  HYPERGLYCEMIA 11/28/2009   HYPERTENSION 01/28/2008   INSOMNIA 05/10/2008   Irritable  bowel syndrome 04/06/2010   OSA on CPAP    OSTEOARTHRITIS 11/28/2009   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   URI (upper respiratory infection) 08/23/2016    Past Surgical History:  Procedure Laterality Date   CATARACT EXTRACTION W/PHACO Left 04/01/2024   Procedure: PHACOEMULSIFICATION, CATARACT, WITH IOL INSERTION 7.10, 00:43.0;  Surgeon: Mittie Gaskin, MD;  Location: [facility];  Service: Ophthalmology;  Laterality: Left;   COLONOSCOPY W/ POLYPECTOMY     ELECTROCARDIOGRAM  12/04/2006   IR RADIOLOGY PERIPHERAL GUIDED IV START  04/21/2018   IR US  GUIDE VASC ACCESS LEFT  04/21/2018   NASAL SEPTUM SURGERY     Stress Cardiolite  02/13/2002    Patient Active Problem List   Diagnosis Date Noted   Giant cell aortic arteritis (HCC) 09/08/2024   SOB (shortness of breath) 09/26/2023   Complaints of total body pain 06/05/2023   OSA (obstructive sleep apnea) 05/31/2023   Snoring 01/03/2023   Cognitive impairment 05/07/2022   Other fatigue 05/07/2022   Fatigue 01/08/2021   DNR (do not resuscitate) 07/24/2020   Tinea versicolor 05/30/2020   Myalgia 12/24/2019   Bronchiectasis without complication (HCC) 08/27/2019   Dysphagia 07/23/2019   Nonintractable headache 03/19/2019   Paresthesia 03/19/2019   Decreased grip strength 02/04/2019   Local superficial swelling 02/02/2019   Weight loss 02/02/2019   Osteopenia 08/09/2018   Chronic rhinitis 07/23/2018   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   Moderate persistent asthma with acute exacerbation 01/30/2018   Advance care planning 01/02/2018   Vitamin D  deficiency 07/08/2017   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/19/2017   Primary osteoarthritis of both knees 01/19/2017   Primary osteoarthritis of both feet 01/19/2017   Deviated septum 12/07/2016   Dyspnea on exertion 11/14/2016   Diverticulosis 03/19/2016   Seasonal and perennial allergic rhinitis 01/18/2016   Lumbar compression  fracture (HCC) 06/20/2015   Fibromyalgia 02/26/2013   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   IRRITABLE BOWEL SYNDROME 04/06/2010   THYROID  NODULE, RIGHT 12/07/2009   OSTEOARTHRITIS 11/28/2009   Chest pain of uncertain etiology 08/26/2008   FIBROIDS, UTERUS 05/10/2008   Insomnia 05/10/2008   ASYMPTOMATIC POSTMENOPAUSAL STATUS 05/10/2008   Essential hypertension 01/28/2008   HYPERCHOLESTEROLEMIA 01/07/2008   GLAUCOMA 01/07/2008   Gastroesophageal reflux disease 04/14/2007   Asthma 04/14/2007    Family History  Problem Relation Age of Onset   Diabetes Mother    Hypertension Mother    Glaucoma Mother    Polymyositis Mother    Heart disease Father        CHF   Allergic rhinitis Father    Asthma Father    Other Father        sideoblastic anemia   Allergic rhinitis Sister    Asthma Sister    Allergic rhinitis Brother    Asthma Brother    Prostate cancer Brother    Sleep apnea Brother    Allergic rhinitis Maternal Aunt    Asthma Maternal Aunt    Allergic rhinitis Paternal Aunt    Asthma Paternal Aunt    Colon cancer Cousin    Breast cancer Cousin    Cancer Neg Hx        No FH of Colon Cancer   Angioedema Neg Hx    Atopy Neg Hx    Immunodeficiency Neg Hx    Urticaria Neg Hx    Eczema Neg Hx     Social  History   Tobacco Use   Smoking status: Never    Passive exposure: Never   Smokeless tobacco: Never  Substance Use Topics   Alcohol use: No    Allergies[1]  Active Medications[2]  Immunization History  Administered Date(s) Administered   Fluad Quad(high Dose 65+) 05/07/2019, 05/24/2020, 05/19/2021, 05/15/2022   Fluad Trivalent(High Dose 65+) 05/06/2023   INFLUENZA, HIGH DOSE SEASONAL PF 05/22/2017, 06/11/2018, 05/06/2023, 06/02/2024   Influenza Split 06/04/2011, 05/27/2012   Influenza Whole 08/18/2008, 05/26/2010   Influenza,inj,Quad PF,6+ Mos 06/08/2013, 05/19/2014, 06/08/2015, 05/07/2016   Influenza-Unspecified 05/17/2020   PFIZER Comirnaty(Gray Top)Covid-19  Tri-Sucrose Vaccine 10/11/2020   PFIZER(Purple Top)SARS-COV-2 Vaccination 10/27/2019, 11/17/2019, 10/11/2020   Pfizer Covid-19 Vaccine Bivalent Booster 43yrs & up 12/21/2021   Pneumococcal Conjugate-13 03/17/2014   Pneumococcal Polysaccharide-23 11/23/2009, 06/08/2015, 07/14/2018   Td 11/09/2002   Tdap 07/03/2013, 01/03/2024   Zoster Recombinant(Shingrix ) 02/19/2019, 08/27/2019   Zoster, Live 06/17/2013        Objective:     Vitals:   09/08/24 1526  BP: (!) 142/66  Pulse: (!) 56  Temp: 97.7 F (36.5 C)  Height: 5' 2 (1.575 m)  Weight: 140 lb 6.4 oz (63.7 kg)  SpO2: 99%  TempSrc: Temporal  BMI (Calculated): 25.67     SpO2: 99 %  GENERAL: Well-developed, well-nourished elderly woman in no acute distress.  Ambulatory with assistance of cane.  No conversational dyspnea. HEAD: Normocephalic, atraumatic.  EYES: Pupils equal, round, reactive to light.  No scleral icterus.  MOUTH: Dentition intact, oral mucosa moist.  No thrush. NECK: Supple. No thyromegaly. Trachea midline. No JVD.  No adenopathy. PULMONARY: Good air entry bilaterally.  No adventitious sounds. CARDIOVASCULAR: S1 and S2. Regular rate and rhythm.  ABDOMEN: Benign. MUSCULOSKELETAL: No joint deformity, no clubbing, no edema.  NEUROLOGIC: No overt focality.  Gait assisted by cane. SKIN: Intact,warm,dry. PSYCH: Mood and behavior normal  Records from Dr. Neysa reviewed.      Assessment & Plan:     ICD-10-CM   1. Moderate persistent asthma with acute exacerbation  J45.41     2. Bronchiectasis without complication (HCC)  J47.9     3. Chronic rhinitis  J31.0     4. Giant cell aortic arteritis (HCC) -under investigation  M31.6      Discussion:    Giant cell arteritis, under investigation Diagnosed by ophthalmologist with symptoms of scalp itching, burning, and headaches. Elevated inflammatory markers. High-dose medication causing fatigue and hyperactivity. Awaiting biopsy to confirm diagnosis and adjust  treatment. - Will need to follow-up on biopsy results. - If she requires ongoing high-dose steroids she will need PJP prophylaxis. - Scheduled follow-up in 3 months to monitor arteritis and treatment response  Asthma, clinically well compensated Managed with Symbicort  and levalbuterol  as needed. Previous albuterol  use caused palpitations, switched to levalbuterol . Lungs clear on examination. - Continue Symbicort  and levalbuterol  as needed  Bronchiectasis No specific discussion or changes in management during this visit. - Continue current management       Advised if symptoms do not improve or worsen, to please contact office for sooner follow up or seek emergency care.    I spent 35 minutes of dedicated to the care of this patient on the date of this encounter to include pre-visit review of records, face-to-face time with the patient discussing conditions above, post visit ordering of testing, clinical documentation with the electronic health record, making appropriate referrals as documented, and communicating necessary findings to members of the patients care team.   C. Leita Sanders,  MD Advanced Bronchoscopy PCCM [facility]    *This note was dictated using voice recognition software/Dragon.  Despite best efforts to proofread, errors can occur which can change the meaning. Any transcriptional errors that result from this process are unintentional and may not be fully corrected at the time of dictation.     [1]  Allergies Allergen Reactions   2,4-D Dimethylamine    Albuterol      Tachycardia   Cefuroxime Axetil     REACTION: pt not sure of reaction- she can tolerate amoxil    Doxycycline  Itching   Iodinated Contrast Media Other (See Comments) and Itching   Latex Itching   Lisinopril     Cough   Lovastatin Other (See Comments)    Muscle aches    Sulfonamide Derivatives Other (See Comments)  [2]  Current Meds  Medication Sig   Azelastine  HCl 0.15 %  SOLN 2 puffs each nostril twice daily as needed   betamethasone , augmented, (DIPROLENE ) 0.05 % lotion as needed (itching).   bifidobacterium infantis (ALIGN) capsule Take 1 capsule by mouth daily.   budesonide -formoterol  (SYMBICORT ) 160-4.5 MCG/ACT inhaler Inhale 2 puffs then rinse mouth, twice daily   cetirizine  (ZYRTEC ) 10 MG tablet Take 1 tablet (10 mg total) by mouth at bedtime.   Cholecalciferol (VITAMIN D ) 50 MCG (2000 UT) CAPS Take 1 capsule by mouth daily.   cromolyn  (NASALCROM ) 5.2 MG/ACT nasal spray Place 1 spray into both nostrils 4 (four) times daily.   diltiazem  (CARDIZEM ) 30 MG tablet MAY TAKE EVERY 8 HOURS BY MOUTH AS NEEDED FOR CHEST DISCOMFORT   dorzolamide (TRUSOPT) 2 % ophthalmic solution Place 1 drop into the left eye 2 (two) times daily.   Ginger 500 MG CAPS Take 500 mg by mouth daily.   hydrochlorothiazide  (HYDRODIURIL ) 12.5 MG tablet TAKE 1 TABLET BY MOUTH DAILY   levalbuterol  (XOPENEX  HFA) 45 MCG/ACT inhaler Inhale 1-2 puffs into the lungs every 6 (six) hours as needed for wheezing.   levalbuterol  (XOPENEX ) 1.25 MG/3ML nebulizer solution Take 1.25 mg by nebulization every 6 (six) hours as needed for wheezing.   losartan  (COZAAR ) 50 MG tablet Take 1 tablet (50 mg total) by mouth daily.   magic mouthwash (nystatin , lidocaine , diphenhydrAMINE, alum & mag hydroxide) suspension Swish and swallow 5 mLs 4 (four) times daily.   MAGNESIUM MALATE PO Take by mouth daily.   Manganese 50 MG TABS Take 50 mg by mouth at bedtime.    mometasone  (ELOCON ) 0.1 % lotion as needed (otc irritation).   montelukast  (SINGULAIR ) 10 MG tablet Take 1 tablet (10 mg total) by mouth at bedtime.   Olopatadine  HCl (PATADAY ) 0.2 % SOLN Place 1 drop into both eyes daily as needed (itchy eyes).   pantoprazole  (PROTONIX ) 40 MG tablet Take 1 tablet (40 mg total) by mouth 2 (two) times daily. DX K21.9 and K58.9   Polyethyl Glycol-Propyl Glycol (SYSTANE) 0.4-0.3 % GEL ophthalmic gel Place 1 application into both  eyes every 6 (six) hours as needed (dry eyes).   Prednisolon-Moxiflox-Bromfenac  1-0.5-0.075 % SOLN Place 1 drop into the left eye at bedtime.   predniSONE  (DELTASONE ) 20 MG tablet Take 20 mg by mouth in the morning, at noon, and at bedtime.   Spacer/Aero-Holding Chambers (AEROCHAMBER MV) inhaler Use as instructed   TART CHERRY PO Take by mouth. Take 1 tablet one time a day by mouth   thiamine (VITAMIN B-1) 50 MG tablet Take 100 mg by mouth daily.   TURMERIC PO Take 1 tablet by mouth daily.   [  DISCONTINUED] predniSONE  (DELTASONE ) 20 MG tablet Take two tablets once daily for five days   "

## 2024-09-08 NOTE — Assessment & Plan Note (Signed)
 blood pressure control important in reducing the progression of atherosclerotic disease. On appropriate oral medications.

## 2024-09-08 NOTE — Progress Notes (Signed)
 "   Patient ID: Samantha [name], female   DOB: Jan 09, 1946, 78 y.o.   MRN: [id number]  Chief Complaint  Patient presents with   Establish Care    Urgent new patient possible GCA/TA biopsy. Ref. [place] pt headed to pulmonary to follow    HPI Samantha [name] is a 78 y.o. female.  I am asked to see the patient by Dr. Mittie for evaluation of left temporal headaches and visual changes concerning for giant cell arteritis.    Discussed the use of AI scribe software for clinical note transcription with the patient, who gave verbal consent to proceed.  History of Present Illness Samantha [name] is a 78 year old female with suspected giant cell arteritis who presents for evaluation of persistent temporal headaches and elevated inflammatory markers.  Her sedimentation rate was mildly elevated at 32.  Her CRP was reported as less than 3 but this is still above the less than 1 threshold for normal.  She describes several years of persistent temporal pain with visible veins and recurrent headaches, recently worsening with radiation toward the eye and sometimes the whole head. Symptoms include burning, itching, and tingling of the scalp, and periorbital discomfort that increased after recent left eye cataract surgery. Lying on her head can trigger diffuse head pain, though this has partially improved.  This has been predominantly the left side in the past but now she has symptoms on the right as well.  She was originally evaluated for temporal arteritis a few years ago but her inflammatory markers were not elevated and so the suspicion went down.  This was about three and a half years ago, lab work for temporal arteritis was normal. After her recent eye surgery, symptoms worsened and repeat labs showed elevated inflammatory markers, prompting vascular evaluation. She is on prednisone  20 mg three times daily for suspected giant cell arteritis but is worried about side effects including insomnia, fatigue, mood  changes, elevated blood pressure, and increased blood glucose. She skipped prednisone  today due to concern about side effects while driving.  Dermatology previously diagnosed scalp inflammation with severe pain, itching, burning, and tingling. She recalls a major left sided head injury in the 1980s after which the temporal veins became more prominent. She had a minor stroke two years ago with intermittent confusion and dizziness and is cautious about driving. She has chest pain followed by cardiology and uses an inhaler for asthma. She is concerned about steroid-related hyperglycemia and attributes her elevated blood pressure today to steroids and anxiety.    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   URI (upper respiratory infection) 08/23/2016    Past Surgical History:  Procedure Laterality Date   CATARACT EXTRACTION W/PHACO Left 04/01/2024   Procedure: PHACOEMULSIFICATION, CATARACT, WITH IOL INSERTION 7.10, 00:43.0;  Surgeon: Mittie Gaskin, MD;  Location: [facility];  Service: Ophthalmology;  Laterality: Left;   COLONOSCOPY  W/ POLYPECTOMY     ELECTROCARDIOGRAM  12/04/2006   IR RADIOLOGY PERIPHERAL GUIDED IV START  04/21/2018   IR US  GUIDE VASC ACCESS LEFT  04/21/2018   NASAL SEPTUM SURGERY     Stress Cardiolite  02/13/2002     Family History  Problem Relation Age of Onset   Diabetes Mother     Hypertension Mother    Glaucoma Mother    Polymyositis Mother    Heart disease Father        CHF   Allergic rhinitis Father    Asthma Father    Other Father        sideoblastic anemia   Allergic rhinitis Sister    Asthma Sister    Allergic rhinitis Brother    Asthma Brother    Prostate cancer Brother    Sleep apnea Brother    Allergic rhinitis Maternal Aunt    Asthma Maternal Aunt    Allergic rhinitis Paternal Aunt    Asthma Paternal Aunt    Colon cancer Cousin    Breast cancer Cousin    Cancer Neg Hx        No FH of Colon Cancer   Angioedema Neg Hx    Atopy Neg Hx    Immunodeficiency Neg Hx    Urticaria Neg Hx    Eczema Neg Hx       Social History[1]   Allergies[2]  Current Outpatient Medications  Medication Sig Dispense Refill   Azelastine  HCl 0.15 % SOLN 2 puffs each nostril twice daily as needed 90 mL 3   betamethasone , augmented, (DIPROLENE ) 0.05 % lotion as needed (itching).     bifidobacterium infantis (ALIGN) capsule Take 1 capsule by mouth daily.     budesonide -formoterol  (SYMBICORT ) 160-4.5 MCG/ACT inhaler Inhale 2 puffs then rinse mouth, twice daily 3 each 4   cetirizine  (ZYRTEC ) 10 MG tablet Take 1 tablet (10 mg total) by mouth at bedtime. 90 tablet 4   Cholecalciferol (VITAMIN D ) 50 MCG (2000 UT) CAPS Take 1 capsule by mouth daily.     cromolyn  (NASALCROM ) 5.2 MG/ACT nasal spray Place 1 spray into both nostrils 4 (four) times daily. 26 mL 12   diltiazem  (CARDIZEM ) 30 MG tablet MAY TAKE EVERY 8 HOURS BY MOUTH AS NEEDED FOR CHEST DISCOMFORT 30 tablet 0   dorzolamide (TRUSOPT) 2 % ophthalmic solution Place 1 drop into the left eye 2 (two) times daily.     Ginger 500 MG CAPS Take 500 mg by mouth daily.     hydrochlorothiazide  (HYDRODIURIL ) 12.5 MG tablet TAKE 1 TABLET BY MOUTH DAILY 90 tablet 0   levalbuterol  (XOPENEX  HFA) 45 MCG/ACT inhaler Inhale 1-2 puffs into the lungs every 6 (six) hours as needed for wheezing. 3 each 3   levalbuterol  (XOPENEX ) 1.25  MG/3ML nebulizer solution Take 1.25 mg by nebulization every 6 (six) hours as needed for wheezing. 125 mL 12   losartan  (COZAAR ) 50 MG tablet Take 1 tablet (50 mg total) by mouth daily. 90 tablet 3   magic mouthwash (nystatin , lidocaine , diphenhydrAMINE, alum & mag hydroxide) suspension Swish and swallow 5 mLs 4 (four) times daily. 180 mL 0   MAGNESIUM MALATE PO Take by mouth daily.     Manganese 50 MG TABS Take 50 mg by mouth at bedtime.      mometasone  (ELOCON ) 0.1 % lotion as needed (otc irritation).     montelukast  (SINGULAIR ) 10 MG tablet Take 1 tablet (10 mg total) by mouth at bedtime. 90 tablet 3  Olopatadine  HCl (PATADAY ) 0.2 % SOLN Place 1 drop into both eyes daily as needed (itchy eyes).     pantoprazole  (PROTONIX ) 40 MG tablet Take 1 tablet (40 mg total) by mouth 2 (two) times daily. DX K21.9 and K58.9 180 tablet 1   Polyethyl Glycol-Propyl Glycol (SYSTANE) 0.4-0.3 % GEL ophthalmic gel Place 1 application into both eyes every 6 (six) hours as needed (dry eyes).     Prednisolon-Moxiflox-Bromfenac  1-0.5-0.075 % SOLN Place 1 drop into the left eye at bedtime.     predniSONE  (DELTASONE ) 20 MG tablet Take two tablets once daily for five days 10 tablet 0   Spacer/Aero-Holding Chambers (AEROCHAMBER MV) inhaler Use as instructed 1 each 2   TART CHERRY PO Take by mouth. Take 1 tablet one time a day by mouth     thiamine (VITAMIN B-1) 50 MG tablet Take 100 mg by mouth daily.     TURMERIC PO Take 1 tablet by mouth daily.     No current facility-administered medications for this visit.      REVIEW OF SYSTEMS (Negative unless checked)  Constitutional: [] Weight loss  [] Fever  [] Chills Cardiac: [] Chest pain   [] Chest pressure   [] Palpitations   [] Shortness of breath when laying flat   [] Shortness of breath at rest   [] Shortness of breath with exertion. Vascular:  [] Pain in legs with walking   [] Pain in legs at rest   [] Pain in legs when laying flat   [] Claudication   [] Pain in feet when  walking  [] Pain in feet at rest  [] Pain in feet when laying flat   [] History of DVT   [] Phlebitis   [] Swelling in legs   [] Varicose veins   [] Non-healing ulcers Pulmonary:   [] Uses home oxygen   [] Productive cough   [] Hemoptysis   [] Wheeze  [] COPD   [x] Asthma Neurologic:  [] Dizziness  [] Blackouts   [] Seizures   [x] History of stroke   [] History of TIA  [] Aphasia   [] Temporary blindness   [] Dysphagia   [] Weakness or numbness in arms   [] Weakness or numbness in legs X positive for headaches Musculoskeletal:  [x] Arthritis   [] Joint swelling   [] Joint pain   [] Low back pain Hematologic:  [] Easy bruising  [] Easy bleeding   [] Hypercoagulable state   [] Anemic  [] Hepatitis Gastrointestinal:  [] Blood in stool   [] Vomiting blood  [x] Gastroesophageal reflux/heartburn   [] Abdominal pain Genitourinary:  [] Chronic kidney disease   [] Difficult urination  [] Frequent urination  [] Burning with urination   [] Hematuria Skin:  [] Rashes   [] Ulcers   [] Wounds Psychological:  [] History of anxiety   []  History of major depression.    Physical Exam BP (!) 166/70 (BP Location: Left Arm, Patient Position: Sitting, Cuff Size: Normal)   Pulse 70   Resp 18   Ht 5' 2 (1.575 m)   Wt 140 lb (63.5 kg)   BMI 25.61 kg/m  Gen:  WD/WN, NAD. Appears younger than stated age Head: [place]/AT, No temporalis wasting. Prominent temp pulse not noted. Ear/Nose/Throat: Hearing grossly intact, nares w/o erythema or drainage, oropharynx w/o Erythema/Exudate Eyes: Conjunctiva clear, sclera non-icteric  Neck: trachea midline.  No JVD.  Pulmonary:  Good air movement, respirations not labored, no use of accessory muscles  Cardiac: RRR, no JVD Vascular:  Vessel Right Left  Radial Palpable Palpable                                   Gastrointestinal:.  No masses, surgical incisions, or scars. Musculoskeletal: M/S 5/5 throughout.  Extremities without ischemic changes.  No deformity or atrophy. No edema. Neurologic: Sensation grossly  intact in extremities.  Symmetrical.  Speech is fluent. Motor exam as listed above. Psychiatric: Judgment intact, Mood & affect appropriate but slightly anxious Dermatologic: No rashes or ulcers noted.  No cellulitis or open wounds.  Physical Exam   Radiology No results found.  Labs Recent Results (from the past 2160 hours)  HM DEXA SCAN     Status: None   Collection Time: 06/12/24  3:23 PM  Result Value Ref Range   HM Dexa Scan OSTEOPENIA     Comment: ABS BY HIM  Pulmonary function test     Status: None   Collection Time: 06/24/24  9:30 AM  Result Value Ref Range   FVC-Pre 2.29 L   FVC-%Pred-Pre 93 %   FVC-Post 2.26 L   FVC-%Pred-Post 92 %   FVC-%Change-Post -1 %   FEV1-Pre 1.87 L   FEV1-%Pred-Pre 102 %   FEV1-Post 1.84 L   FEV1-%Pred-Post 100 %   FEV1-%Change-Post -1 %   FEV6-Pre 2.29 L   FEV6-%Pred-Pre 98 %   FEV6-Post 2.26 L   FEV6-%Pred-Post 97 %   FEV6-%Change-Post -1 %   Pre FEV1/FVC ratio 81 %   FEV1FVC-%Pred-Pre 109 %   Post FEV1/FVC ratio 82 %   FEV1FVC-%Change-Post 0 %   Pre FEV6/FVC Ratio 100 %   FEV6FVC-%Pred-Pre 105 %   Post FEV6/FVC ratio 100 %   FEV6FVC-%Pred-Post 105 %   FEF 25-75 Pre 2.01 L/sec   FEF2575-%Pred-Pre 143 %   FEF 25-75 Post 1.92 L/sec   FEF2575-%Pred-Post 137 %   FEF2575-%Change-Post -4 %   RV 2.01 L   RV % pred 89 %   TLC 4.31 L   TLC % pred 90 %   DLCO unc 18.85 ml/min/mmHg   DLCO unc % pred 108 %   DL/VA 4.91 ml/min/mmHg/L   DL/VA % pred 878 %  Rapid Strep A     Status: None   Collection Time: 07/02/24 11:33 AM  Result Value Ref Range   Rapid Strep A Screen Negative Negative  POC COVID-19     Status: None   Collection Time: 07/02/24 11:33 AM  Result Value Ref Range   SARS Coronavirus 2 Ag Negative Negative  CBC with Differential     Status: None   Collection Time: 07/28/24 12:22 PM  Result Value Ref Range   WBC 5.6 4.0 - 10.5 K/uL   RBC 4.27 3.87 - 5.11 Mil/uL   Hemoglobin 12.9 12.0 - 15.0 g/dL   HCT 63.4 63.9 -  53.9 %   MCV 85.3 78.0 - 100.0 fl   MCHC 35.2 30.0 - 36.0 g/dL   RDW 86.1 88.4 - 84.4 %   Platelets 185.0 150.0 - 400.0 K/uL   Neutrophils Relative % 53.1 43.0 - 77.0 %   Lymphocytes Relative 38.4 12.0 - 46.0 %   Monocytes Relative 7.2 3.0 - 12.0 %   Eosinophils Relative 0.8 0.0 - 5.0 %   Basophils Relative 0.5 0.0 - 3.0 %   Neutro Abs 3.0 1.4 - 7.7 K/uL   Lymphs Abs 2.1 0.7 - 4.0 K/uL   Monocytes Absolute 0.4 0.1 - 1.0 K/uL   Eosinophils Absolute 0.0 0.0 - 0.7 K/uL   Basophils Absolute 0.0 0.0 - 0.1 K/uL  Basic Metabolic Panel     Status: Abnormal   Collection Time: 07/28/24 12:22 PM  Result Value Ref  Range   Sodium 139 135 - 145 mEq/L   Potassium 3.6 3.5 - 5.1 mEq/L   Chloride 103 96 - 112 mEq/L   CO2 30 19 - 32 mEq/L    Comment: Elevated LDH levels may cause falsely increased CO2 results. If LDH is >2000 U/L, a positive bias of 12% is possible.   Glucose, Bld 115 (H) 70 - 99 mg/dL   BUN 16 6 - 23 mg/dL   Creatinine, Ser 9.05 0.40 - 1.20 mg/dL   GFR 42.06 (L) >39.99 mL/min    Comment: Calculated using the CKD-EPI Creatinine Equation (2021)   Calcium 10.0 8.4 - 10.5 mg/dL  Brain natriuretic peptide     Status: None   Collection Time: 07/28/24 12:22 PM  Result Value Ref Range   Pro B Natriuretic peptide (BNP) 14.0 0.0 - 100.0 pg/mL  CBC     Status: None   Collection Time: 08/28/24  9:54 AM  Result Value Ref Range   WBC 6.0 4.0 - 10.5 K/uL   RBC 4.32 3.87 - 5.11 MIL/uL   Hemoglobin 12.7 12.0 - 15.0 g/dL   HCT 63.6 63.9 - 53.9 %   MCV 84.0 80.0 - 100.0 fL   MCH 29.4 26.0 - 34.0 pg   MCHC 35.0 30.0 - 36.0 g/dL   RDW 86.9 88.4 - 84.4 %   Platelets 233 150 - 400 K/uL   nRBC 0.0 0.0 - 0.2 %    Comment: Performed at [facility], [address] [place], [place] [address]  C-reactive protein     Status: Abnormal   Collection Time: 08/28/24  9:54 AM  Result Value Ref Range   CRP <3.0 (H) <1.0 mg/dL    Comment: Performed at [facility], [address],  [place], [place] [address]  Sedimentation rate     Status: Abnormal   Collection Time: 08/28/24  9:54 AM  Result Value Ref Range   Sed Rate 32 (H) 0 - 30 mm/hr    Comment: Performed at [facility], [address] [place], [place] [address]    Assessment/Plan: Assessment & Plan Suspected giant cell arteritis Relatively high clinical suspicion for giant cell arteritis due to symptoms and elevated inflammatory markers. Temporal artery biopsy required for definitive diagnosis. - Discussed temporal artery biopsy as diagnostic gold standard. - Explained biopsy procedure: Roughly 1-inch incision anterior to ear, performed under IV sedation, closure with dissolvable sutures and Dermabond.  Plan left sided biopsy as this is the more symptomatic side. - Advised no driving or strenuous activity on procedure day; resume normal activities after 24 hours. - Instructed need for companion on procedure day. - Initiated biopsy scheduling, preapproval, and insurance coordination. - Provided reassurance on procedural safety; no preoperative clearance needed without need for general anesthesia. - Reviewed management plan: negative biopsy leads to rapid corticosteroid taper; positive biopsy requires prolonged corticosteroid therapy for at least six months with gradual taper.  Essential hypertension blood pressure control important in reducing the progression of atherosclerotic disease. On appropriate oral medications.   Insomnia Can be worsened with steroids     Samantha [name] 09/08/2024, 2:16 PM   This note was created with Dragon medical transcription system.  Any errors from dictation are unintentional.       [1]  Social History Tobacco Use   Smoking status: Never    Passive exposure: Never   Smokeless tobacco: Never  Vaping Use   Vaping status: Never Used  Substance Use Topics   Alcohol  use: No   Drug use: No  [2]  Allergies Allergen Reactions   2,4-D Dimethylamine    Albuterol       Tachycardia   Cefuroxime Axetil     REACTION: pt not sure of reaction- she can tolerate amoxil    Doxycycline  Itching   Iodinated Contrast Media Other (See Comments) and Itching   Latex Itching   Lisinopril     Cough   Lovastatin Other (See Comments)    Muscle aches    Sulfonamide Derivatives Other (See Comments)   "

## 2024-09-08 NOTE — Telephone Encounter (Signed)
 I attempted to contact the patient to schedule a left temporal artery biopsy. A message was left for a return call.

## 2024-09-09 ENCOUNTER — Telehealth (INDEPENDENT_AMBULATORY_CARE_PROVIDER_SITE_OTHER): Payer: Self-pay

## 2024-09-09 ENCOUNTER — Telehealth (INDEPENDENT_AMBULATORY_CARE_PROVIDER_SITE_OTHER): Payer: Self-pay | Admitting: Vascular Surgery

## 2024-09-09 NOTE — Telephone Encounter (Signed)
 Pt returned call your call for scheduling

## 2024-09-09 NOTE — Telephone Encounter (Signed)
 Spoke with the patient and she is scheduled with Dr. Marea for a left temporal artery biopsy on 09/11/24 at the [facility]. Pre-op is a two hour arrival time to same day surgery the day of surgery. Pre-surgical instructions were discussed and patient stated she wrote them down. This will be sent to Mychart. Patient was advised per pre-admit to arrive at 6:00 am the morning of surgery.

## 2024-09-09 NOTE — Telephone Encounter (Signed)
 Pt returned call for scheduling procedure she stated to call her on her mobile phone at [phone]

## 2024-09-11 ENCOUNTER — Other Ambulatory Visit: Payer: Self-pay

## 2024-09-11 ENCOUNTER — Ambulatory Visit: Admitting: Anesthesiology

## 2024-09-11 ENCOUNTER — Encounter: Payer: Self-pay | Admitting: Vascular Surgery

## 2024-09-11 ENCOUNTER — Encounter
Admission: RE | Disposition: A | Discharge status: Home / Self Care | Payer: Self-pay | Source: Home / Self Care | Attending: Vascular Surgery

## 2024-09-11 ENCOUNTER — Ambulatory Visit
Admission: RE | Admit: 2024-09-11 | Discharge: 2024-09-11 | Disposition: A | Discharge status: Home / Self Care | Attending: Vascular Surgery | Admitting: Vascular Surgery

## 2024-09-11 DIAGNOSIS — R519 Headache, unspecified: Secondary | ICD-10-CM

## 2024-09-11 DIAGNOSIS — J45909 Unspecified asthma, uncomplicated: Secondary | ICD-10-CM | POA: Diagnosis not present

## 2024-09-11 DIAGNOSIS — K219 Gastro-esophageal reflux disease without esophagitis: Secondary | ICD-10-CM | POA: Diagnosis not present

## 2024-09-11 DIAGNOSIS — M316 Other giant cell arteritis: Secondary | ICD-10-CM | POA: Diagnosis present

## 2024-09-11 DIAGNOSIS — H539 Unspecified visual disturbance: Secondary | ICD-10-CM

## 2024-09-11 DIAGNOSIS — I10 Essential (primary) hypertension: Secondary | ICD-10-CM | POA: Insufficient documentation

## 2024-09-11 DIAGNOSIS — G4730 Sleep apnea, unspecified: Secondary | ICD-10-CM | POA: Diagnosis not present

## 2024-09-11 HISTORY — PX: ARTERY BIOPSY: SHX891

## 2024-09-11 LAB — BASIC METABOLIC PANEL WITH GFR
Anion gap: 11 (ref 5–15)
BUN: 30 mg/dL — ABNORMAL HIGH (ref 8–23)
CO2: 23 mmol/L (ref 22–32)
Calcium: 9.8 mg/dL (ref 8.9–10.3)
Chloride: 102 mmol/L (ref 98–111)
Creatinine, Ser: 0.98 mg/dL (ref 0.44–1.00)
GFR, Estimated: 58 mL/min — ABNORMAL LOW
Glucose, Bld: 177 mg/dL — ABNORMAL HIGH (ref 70–99)
Potassium: 4.2 mmol/L (ref 3.5–5.1)
Sodium: 136 mmol/L (ref 135–145)

## 2024-09-11 LAB — CBC WITH DIFFERENTIAL/PLATELET
Abs Immature Granulocytes: 0.21 K/uL — ABNORMAL HIGH (ref 0.00–0.07)
Basophils Absolute: 0 K/uL (ref 0.0–0.1)
Basophils Relative: 0 %
Eosinophils Absolute: 0 K/uL (ref 0.0–0.5)
Eosinophils Relative: 0 %
HCT: 37.3 % (ref 36.0–46.0)
Hemoglobin: 13.3 g/dL (ref 12.0–15.0)
Immature Granulocytes: 2 %
Lymphocytes Relative: 9 %
Lymphs Abs: 1.2 K/uL (ref 0.7–4.0)
MCH: 29.6 pg (ref 26.0–34.0)
MCHC: 35.7 g/dL (ref 30.0–36.0)
MCV: 83.1 fL (ref 80.0–100.0)
Monocytes Absolute: 0.9 K/uL (ref 0.1–1.0)
Monocytes Relative: 7 %
Neutro Abs: 10.8 K/uL — ABNORMAL HIGH (ref 1.7–7.7)
Neutrophils Relative %: 82 %
Platelets: 182 K/uL (ref 150–400)
RBC: 4.49 MIL/uL (ref 3.87–5.11)
RDW: 12.9 % (ref 11.5–15.5)
WBC: 13.1 K/uL — ABNORMAL HIGH (ref 4.0–10.5)
nRBC: 0 % (ref 0.0–0.2)

## 2024-09-11 SURGERY — BIOPSY TEMPORAL ARTERY
Anesthesia: General | Site: Face | Laterality: Left

## 2024-09-11 MED ORDER — GLYCOPYRROLATE 0.2 MG/ML IJ SOLN
INTRAMUSCULAR | Status: DC | PRN
  Administered 2024-09-11: .2 mg via INTRAVENOUS

## 2024-09-11 MED ORDER — PROPOFOL 500 MG/50ML IV EMUL
INTRAVENOUS | Status: DC | PRN
  Administered 2024-09-11: 100 ug/kg/min via INTRAVENOUS

## 2024-09-11 MED ORDER — LIDOCAINE HCL (PF) 1 % IJ SOLN
INTRAMUSCULAR | Status: DC | PRN
  Administered 2024-09-11: 10 mL

## 2024-09-11 MED ORDER — CHLORHEXIDINE GLUCONATE CLOTH 2 % EX PADS: 6.0000 | MEDICATED_PAD | Freq: Once | CUTANEOUS | Status: DC

## 2024-09-11 MED ORDER — VANCOMYCIN HCL IN DEXTROSE 1-5 GM/200ML-% IV SOLN
INTRAVENOUS | Status: AC
  Filled 2024-09-11: qty 200

## 2024-09-11 MED ORDER — ORAL CARE MOUTH RINSE: 15.0000 mL | Freq: Once | OROMUCOSAL | Status: AC

## 2024-09-11 MED ORDER — LIDOCAINE HCL (PF) 1 % IJ SOLN
INTRAMUSCULAR | Status: AC
  Filled 2024-09-11: qty 30

## 2024-09-11 MED ORDER — PROPOFOL 10 MG/ML IV BOLUS
INTRAVENOUS | Status: AC
  Filled 2024-09-11: qty 20

## 2024-09-11 MED ORDER — MIDAZOLAM HCL 2 MG/2ML IJ SOLN
INTRAMUSCULAR | Status: AC
  Filled 2024-09-11: qty 2

## 2024-09-11 MED ORDER — FENTANYL CITRATE (PF) 100 MCG/2ML IJ SOLN
INTRAMUSCULAR | Status: AC
  Filled 2024-09-11: qty 2

## 2024-09-11 MED ORDER — LACTATED RINGERS IV SOLN: INTRAVENOUS | Status: DC

## 2024-09-11 MED ORDER — CHLORHEXIDINE GLUCONATE 0.12 % MT SOLN
15.0000 mL | Freq: Once | OROMUCOSAL | Status: AC
  Administered 2024-09-11: 15 mL via OROMUCOSAL

## 2024-09-11 MED ORDER — CHLORHEXIDINE GLUCONATE 0.12 % MT SOLN
OROMUCOSAL | Status: AC
  Filled 2024-09-11: qty 15

## 2024-09-11 MED ORDER — DEXMEDETOMIDINE HCL IN NACL 80 MCG/20ML IV SOLN
INTRAVENOUS | Status: DC | PRN
  Administered 2024-09-11: 8 ug via INTRAVENOUS

## 2024-09-11 MED ORDER — PROPOFOL 1000 MG/100ML IV EMUL
INTRAVENOUS | Status: AC
  Filled 2024-09-11: qty 100

## 2024-09-11 MED ORDER — VANCOMYCIN HCL IN DEXTROSE 1-5 GM/200ML-% IV SOLN
1000.0000 mg | INTRAVENOUS | Status: AC
  Administered 2024-09-11: 1000 mg via INTRAVENOUS

## 2024-09-11 MED ORDER — MIDAZOLAM HCL (PF) 2 MG/2ML IJ SOLN
INTRAMUSCULAR | Status: DC | PRN
  Administered 2024-09-11: 2 mg via INTRAVENOUS

## 2024-09-11 MED ORDER — 0.9 % SODIUM CHLORIDE (POUR BTL) OPTIME
TOPICAL | Status: DC | PRN
  Administered 2024-09-11: 500 mL

## 2024-09-11 SURGICAL SUPPLY — 29 items
BLADE SURG 15 STRL LF DISP TIS (BLADE) ×1 IMPLANT
BLADE SURG SZ11 CARB STEEL (BLADE) ×1 IMPLANT
CLEANSER WND VASHE 4 (WOUND CARE) IMPLANT
COTTON BALL STERILE 4 PK (GAUZE/BANDAGES/DRESSINGS) ×1 IMPLANT
DERMABOND ADVANCED .7 DNX12 (GAUZE/BANDAGES/DRESSINGS) ×1 IMPLANT
DRAPE LAPAROTOMY 77X122 PED (DRAPES) ×1 IMPLANT
DRSG TELFA 3X4 N-ADH STERILE (GAUZE/BANDAGES/DRESSINGS) ×1 IMPLANT
ELECT CAUTERY BLADE 6.4 (BLADE) ×1 IMPLANT
ELECTRODE REM PT RTRN 9FT ADLT (ELECTROSURGICAL) ×1 IMPLANT
GAUZE 4X4 16PLY ~~LOC~~+RFID DBL (SPONGE) IMPLANT
GLOVE BIO SURGEON STRL SZ7 (GLOVE) ×2 IMPLANT
GOWN STRL REUS W/ TWL LRG LVL3 (GOWN DISPOSABLE) ×2 IMPLANT
GOWN STRL REUS W/TWL 2XL LVL3 (GOWN DISPOSABLE) ×1 IMPLANT
KIT TURNOVER KIT A (KITS) ×1 IMPLANT
LABEL OR SOLS (LABEL) ×1 IMPLANT
MANIFOLD NEPTUNE II (INSTRUMENTS) ×1 IMPLANT
NEEDLE HYPO 25X1 1.5 SAFETY (NEEDLE) IMPLANT
NS IRRIG 500ML POUR BTL (IV SOLUTION) ×1 IMPLANT
PACK BASIN MINOR ARMC (MISCELLANEOUS) ×1 IMPLANT
SOLN STERILE WATER 500 ML (IV SOLUTION) ×1 IMPLANT
SOLUTION PREP PVP 2OZ (MISCELLANEOUS) ×1 IMPLANT
SUCTION TUBE FRAZIER 10FR DISP (SUCTIONS) ×1 IMPLANT
SUT MNCRL AB 4-0 PS2 18 (SUTURE) ×1 IMPLANT
SUT SILK 2-0 18XBRD TIE 12 (SUTURE) ×1 IMPLANT
SUT SILK 3-0 18XBRD TIE 12 (SUTURE) ×1 IMPLANT
SUT SILK 4-0 18XBRD TIE 12 (SUTURE) ×1 IMPLANT
SUT VIC AB 3-0 SH 27X BRD (SUTURE) ×1 IMPLANT
SYR 10ML LL (SYRINGE) IMPLANT
TRAP FLUID SMOKE EVACUATOR (MISCELLANEOUS) ×1 IMPLANT

## 2024-09-11 NOTE — Op Note (Signed)
" °        OPERATIVE NOTE   PRE-OPERATIVE DIAGNOSIS: suspected temporal arteritis, headaches and visual issues  POST-OPERATIVE DIAGNOSIS: Same as above  PROCEDURE: 1.   Left temporal artery biopsy  SURGEON: Selinda Gu, MD  ASSISTANT(S): none  ANESTHESIA: MAC  ESTIMATED BLOOD LOSS: Minimal  FINDING(S): 1.  none  SPECIMEN(S):  Left superficial temporal artery sent to pathology  INDICATIONS:   Patient is a 79 y.o. female who presents with headaches and visual issues concerning for temporal arteritis.  We were consulted by ophthalmology for consideration for temporal artery biopsy. Risks and benefits were discussed and he was agreeable to proceed.  DESCRIPTION: After obtaining full informed written consent, the patient was brought back to the operating room and placed supine upon the operating table.  The patient received IV antibiotics prior to induction.  After obtaining adequate anesthesia, the patient was prepped and draped in the standard fashion. The area in front of his left ear was anesthetized copiously with a solution of 1% lidocaine  and half percent Marcaine without epinephrine . I then made an incision just in front of the left ear overlying the palpable pulse. I then dissected down through the subcutaneous tissues and identified the superficial temporal artery. This was dissected out over a several centimeters and branches were ligated and divided between silk ties. Care was used to avoid electrocautery around the artery. I then clamped the artery proximally and distally and transected the artery. The specimen was then sent to pathology. The proximal and distal artery were ligated with 3-0 silk ties. Hemostasis was achieved. The wound was then closed with a series of interrupted 3-0 Vicryl's and the skin was closed with a 4-0 Monocryl. Sterile dressing was placed. The patient was taken to the recovery room in stable condition having tolerated the procedure well.  COMPLICATIONS:  None  CONDITION: Stable   Selinda Gu 09/11/2024 8:25 AM  This note was created with Dragon Medical transcription system. Any errors in dictation are purely unintentional. "

## 2024-09-11 NOTE — Addendum Note (Signed)
 Addendum  created 09/11/24 1022 by Vinie Sari HERO, RN   Intraprocedure Event deleted, Intraprocedure Event edited

## 2024-09-11 NOTE — Transfer of Care (Signed)
 Immediate Anesthesia Transfer of Care Note  Patient: Samantha [name]  Procedure(s) Performed: BIOPSY TEMPORAL ARTERY (Left: Face)  Patient Location: PACU  Anesthesia Type:General  Level of Consciousness: sedated  Airway & Oxygen Therapy: Patient Spontanous Breathing  Post-op Assessment: Report given to RN and Post -op Vital signs reviewed and stable  Post vital signs: Reviewed and stable  Last Vitals:  Vitals Value Taken Time  BP 143/55 09/11/24 08:28  Temp    Pulse 68 09/11/24 08:30  Resp 13 09/11/24 08:30  SpO2 97 % 09/11/24 08:30  Vitals shown include unfiled device data.  Last Pain:  Vitals:   09/11/24 0627  TempSrc: Temporal  PainSc: 0-No pain         Complications: No notable events documented.

## 2024-09-11 NOTE — Interval H&P Note (Signed)
 History and Physical Interval Note:  09/11/2024 7:18 AM  Samantha [name]  has presented today for surgery, with the diagnosis of TEMPORAL ARTERITIS.  The various methods of treatment have been discussed with the patient and family. After consideration of risks, benefits and other options for treatment, the patient has consented to  Procedures: BIOPSY TEMPORAL ARTERY (Left) as a surgical intervention.  The patient's history has been reviewed, patient examined, no change in status, stable for surgery.  I have reviewed the patient's chart and labs.  Questions were answered to the patient's satisfaction.     Sherilyn Windhorst

## 2024-09-11 NOTE — Anesthesia Procedure Notes (Signed)
 Procedure Name: MAC Date/Time: 09/11/2024 8:14 AM  Performed by: Elly Pfeiffer, CRNAPreoxygenation: Pre-oxygenation with 100% oxygen Induction Type: IV induction

## 2024-09-11 NOTE — Anesthesia Postprocedure Evaluation (Signed)
"   Anesthesia Post Note  Patient: Samantha [name]  Procedure(s) Performed: BIOPSY TEMPORAL ARTERY (Left: Face)  Patient location during evaluation: PACU Anesthesia Type: General Level of consciousness: awake Pain management: satisfactory to patient Vital Signs Assessment: post-procedure vital signs reviewed and stable Respiratory status: spontaneous breathing Cardiovascular status: stable Anesthetic complications: no   No notable events documented.   Last Vitals:  Vitals:   09/11/24 0845 09/11/24 0853  BP: (!) 165/59   Pulse: (!) 57 81  Resp: 13 16  Temp:    SpO2: 99% 100%    Last Pain:  Vitals:   09/11/24 0853  TempSrc:   PainSc: 0-No pain                 VAN STAVEREN,Marnisha Stampley      "

## 2024-09-11 NOTE — Anesthesia Preprocedure Evaluation (Signed)
"                                    Anesthesia Evaluation  Patient identified by MRN, date of birth, ID band Patient awake    Reviewed: Allergy  & Precautions, NPO status , Patient's Chart, lab work & pertinent test results  Airway Mallampati: III  TM Distance: >3 FB Neck ROM: full    Dental  (+) Teeth Intact   Pulmonary neg pulmonary ROS, asthma , sleep apnea  Hx of brochiectasis   Pulmonary exam normal  + decreased breath sounds      Cardiovascular Exercise Tolerance: Good hypertension, Pt. on medications negative cardio ROS Normal cardiovascular exam Rhythm:Regular Rate:Normal     Neuro/Psych  Headaches TIAnegative neurological ROS  negative psych ROS   GI/Hepatic negative GI ROS, Neg liver ROS,GERD  Medicated,,  Endo/Other  negative endocrine ROS    Renal/GU negative Renal ROS  negative genitourinary   Musculoskeletal   Abdominal Normal abdominal exam  (+)   Peds negative pediatric ROS (+)  Hematology negative hematology ROS (+)   Anesthesia Other Findings Past Medical History: 07/26/2008: ALLERGIC RHINITIS 04/14/2007: ASTHMA 05/10/2008: ASYMPTOMATIC POSTMENOPAUSAL STATUS No date: Bronchiectasis (HCC) 08/26/2008: CHEST PAIN 04/14/2007: COLONIC POLYPS, HX OF     Comment:  colonic leiomyoma No date: Decreased grip strength No date: Episcleritis 05/10/2008: FIBROIDS, UTERUS No date: Fibromyalgia No date: Gait abnormality 04/14/2007: GERD 01/07/2008: HYPERCHOLESTEROLEMIA 11/28/2009: HYPERGLYCEMIA 01/28/2008: HYPERTENSION 05/10/2008: INSOMNIA 04/06/2010: Irritable bowel syndrome No date: OSA on CPAP 11/28/2009: OSTEOARTHRITIS 09/07/2010: RAYNAUD'S DISEASE 08/23/2016: URI (upper respiratory infection)  Past Surgical History: 04/01/2024: CATARACT EXTRACTION W/PHACO; Left     Comment:  Procedure: PHACOEMULSIFICATION, CATARACT, WITH IOL               INSERTION 7.10, 00:43.0;  Surgeon: Mittie Gaskin,               MD;  Location:  [facility];  Service:               Ophthalmology;  Laterality: Left; No date: COLONOSCOPY W/ POLYPECTOMY 12/04/2006: ELECTROCARDIOGRAM 04/21/2018: IR RADIOLOGY PERIPHERAL GUIDED IV START 04/21/2018: IR US  GUIDE VASC ACCESS LEFT No date: NASAL SEPTUM SURGERY 02/13/2002: Stress Cardiolite  BMI    Body Mass Index: 25.68 kg/m      Reproductive/Obstetrics negative OB ROS                              Anesthesia Physical Anesthesia Plan  ASA: 3  Anesthesia Plan: General   Post-op Pain Management:    Induction: Intravenous  PONV Risk Score and Plan: Propofol infusion and TIVA  Airway Management Planned: Natural Airway and Nasal Cannula  Additional Equipment:   Intra-op Plan:   Post-operative Plan:   Informed Consent: I have reviewed the patients History and Physical, chart, labs and discussed the procedure including the risks, benefits and alternatives for the proposed anesthesia with the patient or authorized representative who has indicated his/her understanding and acceptance.     Dental Advisory Given  Plan Discussed with: CRNA  Anesthesia Plan Comments:          Anesthesia Quick Evaluation  "

## 2024-09-12 ENCOUNTER — Encounter: Payer: Self-pay | Admitting: Vascular Surgery

## 2024-09-14 LAB — SURGICAL PATHOLOGY

## 2024-09-20 ENCOUNTER — Ambulatory Visit: Payer: Self-pay | Admitting: Family Medicine

## 2024-09-24 ENCOUNTER — Ambulatory Visit: Admitting: Family Medicine

## 2024-09-24 ENCOUNTER — Encounter: Payer: Self-pay | Admitting: Family Medicine

## 2024-09-24 VITALS — BP 144/60 | HR 69 | Temp 98.2°F | Ht 62.0 in | Wt 135.4 lb

## 2024-09-24 DIAGNOSIS — E559 Vitamin D deficiency, unspecified: Secondary | ICD-10-CM | POA: Diagnosis not present

## 2024-09-24 DIAGNOSIS — J310 Chronic rhinitis: Secondary | ICD-10-CM | POA: Diagnosis not present

## 2024-09-24 DIAGNOSIS — M8589 Other specified disorders of bone density and structure, multiple sites: Secondary | ICD-10-CM | POA: Diagnosis not present

## 2024-09-24 DIAGNOSIS — R739 Hyperglycemia, unspecified: Secondary | ICD-10-CM

## 2024-09-24 DIAGNOSIS — I10 Essential (primary) hypertension: Secondary | ICD-10-CM | POA: Diagnosis not present

## 2024-09-24 DIAGNOSIS — R519 Headache, unspecified: Secondary | ICD-10-CM | POA: Diagnosis not present

## 2024-09-24 MED ORDER — BENZONATATE 200 MG PO CAPS: 200.0000 mg | ORAL_CAPSULE | Freq: Three times a day (TID) | ORAL | 1 refills | Status: AC | PRN

## 2024-09-24 MED ORDER — LEVOCETIRIZINE DIHYDROCHLORIDE 5 MG PO TABS: 5.0000 mg | ORAL_TABLET | Freq: Every evening | ORAL | 3 refills | Status: AC

## 2024-09-24 NOTE — Progress Notes (Signed)
 Prev path dw pt.  FRAGMENT OF ARTERIAL WALL WITH NO SPECIFIC PATHOLOGIC CHANGES Surgical site is healing on the L temporal area.    HA are some better in the meantime.  She was prev on prednisone .  Unclear if HA improvement is related to prednisone  use or if she improved on her own.   She had seen neuro in the meantime.   Saw dermatology and started topical treatment- I asked her to update me about the med.  See following communication from patient.   She had used tessalon  prn for cough.  Has pulmonary f/u pending.   Rx sent for xyzal , to change from zyrtec .   Discussed with patient about calling for cardiology follow-up.  See after visit summary.  Hypertension:    Using medication without problems or lightheadedness: yes Chest pain with exertion:no Edema:no Short of breath:no  History of osteopenia.  Vitamin D  pending.  History of hyperglycemia.  Recheck labs pending.  Meds, vitals, and allergies reviewed.   ROS: Per HPI unless specifically indicated in ROS section   Nad Ncat Surgical site is healing on the L temporal area.   No spreading erythema.  Sinuses nontender to palpation x 4. Neck supple, no LA Rrr Ctab Abd soft, not ttp Skin well-perfused.

## 2024-09-24 NOTE — Patient Instructions (Addendum)
 Let me know about the topical med you had used.   Recheck labs before a yearly visit in April.   Update me as needed.    Please call cardiology about follow up.  Dr. Santo.  [facility] [address] [place], [place] [address] [phone]

## 2024-09-25 ENCOUNTER — Ambulatory Visit (INDEPENDENT_AMBULATORY_CARE_PROVIDER_SITE_OTHER): Admitting: Nurse Practitioner

## 2024-09-25 ENCOUNTER — Telehealth: Payer: Self-pay | Admitting: *Deleted

## 2024-09-25 ENCOUNTER — Encounter (INDEPENDENT_AMBULATORY_CARE_PROVIDER_SITE_OTHER): Payer: Self-pay | Admitting: Nurse Practitioner

## 2024-09-25 VITALS — BP 128/57 | HR 67 | Resp 17 | Ht 62.0 in | Wt 135.0 lb

## 2024-09-25 DIAGNOSIS — M316 Other giant cell arteritis: Secondary | ICD-10-CM

## 2024-09-25 DIAGNOSIS — I10 Essential (primary) hypertension: Secondary | ICD-10-CM

## 2024-09-25 NOTE — Telephone Encounter (Signed)
 Copied from CRM [id number]. Topic: Clinical - Medication Question >> Sep 25, 2024  9:36 AM Laymon HERO wrote: Reason for CRM: Name of medication from Dermatologist- Betamethasone  Dipropionate (augmented) usp, 0.05 %  lotion for scalp use 2x daily as needed

## 2024-09-27 DIAGNOSIS — R739 Hyperglycemia, unspecified: Secondary | ICD-10-CM | POA: Insufficient documentation

## 2024-09-27 NOTE — Assessment & Plan Note (Signed)
 See notes on labs.

## 2024-09-27 NOTE — Assessment & Plan Note (Signed)
 Improved in the meantime.  Unclear if this was from prednisone .  Negative temporal artery biopsy discussed with patient.  Follow clinically for now.  Update me as needed.

## 2024-09-27 NOTE — Assessment & Plan Note (Signed)
 History of.  Can use Xyzal  instead of Zyrtec .

## 2024-09-27 NOTE — Assessment & Plan Note (Signed)
 Continue diltiazem  as needed.  Continue hydrochlorothiazide .  See notes on labs.

## 2024-09-27 NOTE — Telephone Encounter (Signed)
 Noted.  That is on the med list.  Thanks.

## 2024-09-27 NOTE — Assessment & Plan Note (Signed)
Recheck vitamin D pending. 

## 2024-10-03 ENCOUNTER — Encounter (INDEPENDENT_AMBULATORY_CARE_PROVIDER_SITE_OTHER): Payer: Self-pay | Admitting: Nurse Practitioner

## 2024-10-03 NOTE — Progress Notes (Signed)
 "  Subjective:    Patient ID: Samantha [name], female    DOB: 01-May-1946, 79 y.o.   MRN: [id number] Chief Complaint  Patient presents with   Follow-up    Follow up with Kabeer Hoagland E, NP (Vascular Surgery) in 2 weeks (09/25/2024); For wound re-check     HPI  Discussed the use of AI scribe software for clinical note transcription with the patient, who gave verbal consent to proceed.  History of Present Illness Samantha [name] is a 79 year old female who presents for post-procedure evaluation following left temporal artery biopsy.  She underwent a left temporal artery biopsy on September 11, 2024, after referral from her ophthalmologist for evaluation of possible temporal arteritis. She was prescribed oral steroids at 20 mg three times daily prior to the procedure, which she discontinued on September 01, 2024. She did not resume steroids post-biopsy.  She reports minimal pain at the biopsy site and attributes improvement in her symptoms to the pre-procedure steroid therapy. She has been diligent with wound care, initially avoiding washing the area and using a shower cap. She has not had her hair professionally styled since the procedure due to concerns about heat and manipulation near the incision. She wears her glasses to avoid pressure on the wound and is seeking masks that do not irritate her ears or the incision area.  She consistently wears a mask in public due to underlying allergies, asthma, and bronchiectasis. She avoids products with strong scents due to allergy  symptoms and prefers vitamin E for topical wound care once the surgical glue is removed. She denies issues with wound healing, bleeding, or dehiscence.    Results Pathology Temporal artery biopsy (09/11/2024): No evidence of temporal arteritis   Review of Systems  Skin:  Positive for wound.  All other systems reviewed and are negative.      Objective:   Physical Exam Vitals reviewed.  HENT:     Head: Normocephalic.   Cardiovascular:     Rate and Rhythm: Normal rate.     Pulses: Normal pulses.  Pulmonary:     Effort: Pulmonary effort is normal.  Skin:    General: Skin is warm and dry.  Neurological:     Mental Status: She is alert and oriented to person, place, and time.  Psychiatric:        Mood and Affect: Mood normal.        Behavior: Behavior normal.        Thought Content: Thought content normal.        Judgment: Judgment normal.     Physical Exam    BP (!) 128/57   Pulse 67   Resp 17   Ht 5' 2 (1.575 m)   Wt 135 lb (61.2 kg)   BMI 24.69 kg/m   Past Medical History:  Diagnosis Date   ALLERGIC RHINITIS 07/26/2008   ASTHMA 04/14/2007   ASYMPTOMATIC POSTMENOPAUSAL STATUS 05/10/2008   Bronchiectasis (HCC)    CHEST PAIN 08/26/2008   COLONIC POLYPS, HX OF 04/14/2007   colonic leiomyoma   Decreased grip strength    Episcleritis    FIBROIDS, UTERUS 05/10/2008   Fibromyalgia    Gait abnormality    GERD 04/14/2007   HYPERCHOLESTEROLEMIA 01/07/2008   HYPERGLYCEMIA 11/28/2009   HYPERTENSION 01/28/2008   INSOMNIA 05/10/2008   Irritable bowel syndrome 04/06/2010   OSA on CPAP    OSTEOARTHRITIS 11/28/2009   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   URI (upper respiratory infection) 08/23/2016    Social History  Socioeconomic History   Marital status: Widowed    Spouse name: Not on file   Number of children: 3   Years of education: some college   Highest education level: Not on file  Occupational History    Employer: RETIRED    Comment: Retired Journalist, 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      2 sons and 1 daughter.        Right-handed.   No  daily caffeine use.   Social Drivers of Health   Tobacco Use: Low Risk (10/03/2024)   Patient History    Smoking Tobacco Use: Never    Smokeless Tobacco Use: Never    Passive Exposure: Never  Financial Resource Strain: Low Risk (12/23/2023)   Overall Financial Resource Strain (CARDIA)    Difficulty of Paying Living Expenses: Not hard at all  Food Insecurity: No Food Insecurity (12/23/2023)   Hunger Vital Sign    Worried About Running Out of Food in the Last Year: Never true    Ran Out of Food in the Last Year: Never true  Transportation Needs: No Transportation Needs (12/23/2023)   PRAPARE - Administrator, Civil Service (Medical): No    Lack of Transportation (Non-Medical): No  Physical Activity: Inactive (12/23/2023)   Exercise Vital Sign    Days of Exercise per Week: 0 days    Minutes of Exercise per Session: 0 min  Stress: No Stress Concern Present (12/23/2023)   [employer] of Occupational Health - Occupational Stress Questionnaire    Feeling of Stress : Not at all  Social Connections: Moderately Isolated (12/23/2023)   Social Connection and Isolation Panel    Frequency of Communication with Friends and Family: More than three times a week    Frequency of Social Gatherings with Friends and Family: Twice a week    Attends Religious Services: More than 4 times per year    Active Member of [employer] or Organizations: No    Attends Banker Meetings: Never    Marital Status: Widowed  Intimate Partner Violence: Not At Risk (12/23/2023)   Humiliation, Afraid, Rape, and Kick questionnaire    Fear of Current or Ex-Partner: No    Emotionally Abused: No    Physically Abused: No    Sexually Abused: No  Depression (PHQ2-9): Medium Risk (09/24/2024)   Depression (PHQ2-9)    PHQ-2 Score: 9  Alcohol Screen: Low Risk (12/23/2023)   Alcohol Screen    Last Alcohol Screening Score (AUDIT): 0  Housing: Low Risk (12/23/2023)   Housing Stability Vital Sign    Unable to  Pay for Housing in the Last Year: No    Number of Times Moved in the Last Year: 0    Homeless in the Last Year: No  Utilities: Not At Risk (12/23/2023)   AHC Utilities    Threatened with loss of utilities: No  Health Literacy: Adequate Health Literacy (12/23/2023)   B1300 Health Literacy    Frequency of need for help with medical instructions: Never    Past Surgical History:  Procedure Laterality Date  ARTERY BIOPSY Left 09/11/2024   Procedure: BIOPSY TEMPORAL ARTERY;  Surgeon: Marea Selinda RAMAN, MD;  Location: [facility];  Service: Vascular;  Laterality: Left;   CATARACT EXTRACTION W/PHACO Left 04/01/2024   Procedure: PHACOEMULSIFICATION, CATARACT, WITH IOL INSERTION 7.10, 00:43.0;  Surgeon: Mittie Gaskin, MD;  Location: [facility];  Service: Ophthalmology;  Laterality: Left;   COLONOSCOPY W/ POLYPECTOMY     ELECTROCARDIOGRAM  12/04/2006   IR RADIOLOGY PERIPHERAL GUIDED IV START  04/21/2018   IR US  GUIDE VASC ACCESS LEFT  04/21/2018   NASAL SEPTUM SURGERY     Stress Cardiolite  02/13/2002    Family History  Problem Relation Age of Onset   Diabetes Mother    Hypertension Mother    Glaucoma Mother    Polymyositis Mother    Heart disease Father        CHF   Allergic rhinitis Father    Asthma Father    Other Father        sideoblastic anemia   Allergic rhinitis Sister    Asthma Sister    Allergic rhinitis Brother    Asthma Brother    Prostate cancer Brother    Sleep apnea Brother    Allergic rhinitis Maternal Aunt    Asthma Maternal Aunt    Allergic rhinitis Paternal Aunt    Asthma Paternal Aunt    Colon cancer Cousin    Breast cancer Cousin    Cancer Neg Hx        No FH of Colon Cancer   Angioedema Neg Hx    Atopy Neg Hx    Immunodeficiency Neg Hx    Urticaria Neg Hx    Eczema Neg Hx     Allergies[1]     Latest Ref Rng & Units 09/11/2024    7:17 AM 08/28/2024    9:54 AM 07/28/2024   12:22 PM  CBC  WBC 4.0 - 10.5 K/uL 13.1  6.0  5.6   Hemoglobin 12.0 -  15.0 g/dL 86.6  87.2  87.0   Hematocrit 36.0 - 46.0 % 37.3  36.3  36.5   Platelets 150 - 400 K/uL 182  233  185.0       CMP     Component Value Date/Time   NA 136 09/11/2024 0717   K 4.2 09/11/2024 0717   CL 102 09/11/2024 0717   CO2 23 09/11/2024 0717   GLUCOSE 177 (H) 09/11/2024 0717   BUN 30 (H) 09/11/2024 0717   CREATININE 0.98 09/11/2024 0717   CALCIUM 9.8 09/11/2024 0717   CALCIUM 10.0 01/22/2012 1014   PROT 6.8 12/25/2023 1033   ALBUMIN 4.6 12/25/2023 1033   AST 17 12/25/2023 1033   ALT 9 12/25/2023 1033   ALKPHOS 70 12/25/2023 1033   BILITOT 0.6 12/25/2023 1033   GFR 57.93 (L) 07/28/2024 1222   GFRNONAA 58 (L) 09/11/2024 0717     No results found.     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 Scar formation expected to fade. Conservative management indicated.No evidence of temporal arteritisd on biopsy  - Advised hair washing and wetting area acceptable, avoid picking or premature glue removal. - Recommended avoiding direct heat; careful blow drying by hairdresser acceptable. - Instructed to avoid vigorous scrubbing over incision during hair washing. - Discussed surgical glue will flake off naturally over 4-8 weeks. - Advised post-glue detachment, apply vitamin E to  scar, avoid cocoa butter due to allergies. - Reassured wearing glasses acceptable if comfortable. - Instructed to monitor for dehiscence, bleeding, or delayed healing, contact office if concerns. - Did not remove glue, explained premature removal risks bleeding or dehiscence.  2. Essential hypertension Continue antihypertensive medications as already ordered, these medications have been reviewed and there are no changes at this time.   Assessment and Plan Assessment & Plan      Medications Ordered Prior to Encounter[2]  There are no Patient  Instructions on file for this visit. No follow-ups on file.   Orvin FORBES Daring, NP      [1]  Allergies Allergen Reactions   2,4-D Dimethylamine    Albuterol      Tachycardia   Cefuroxime Axetil     REACTION: pt not sure of reaction- she can tolerate amoxil    Doxycycline  Itching   Iodinated Contrast Media Other (See Comments) and Itching   Latex Itching   Lisinopril     Cough   Lovastatin Other (See Comments)    Muscle aches    Sulfonamide Derivatives Other (See Comments)  [2]  Current Outpatient Medications on File Prior to Visit  Medication Sig Dispense Refill   Azelastine  HCl 0.15 % SOLN 2 puffs each nostril twice daily as needed 90 mL 3   betamethasone , augmented, (DIPROLENE ) 0.05 % lotion as needed (itching).     bifidobacterium infantis (ALIGN) capsule Take 1 capsule by mouth daily.     budesonide -formoterol  (SYMBICORT ) 160-4.5 MCG/ACT inhaler Inhale 2 puffs then rinse mouth, twice daily 3 each 4   Cholecalciferol (VITAMIN D ) 50 MCG (2000 UT) CAPS Take 1 capsule by mouth daily.     cromolyn  (NASALCROM ) 5.2 MG/ACT nasal spray Place 1 spray into both nostrils 4 (four) times daily. 26 mL 12   diltiazem  (CARDIZEM ) 30 MG tablet MAY TAKE EVERY 8 HOURS BY MOUTH AS NEEDED FOR CHEST DISCOMFORT 30 tablet 0   dorzolamide (TRUSOPT) 2 % ophthalmic solution Place 1 drop into the left eye 2 (two) times daily.     Ginger 500 MG CAPS Take 500 mg by mouth daily.     hydrochlorothiazide  (HYDRODIURIL ) 12.5 MG tablet TAKE 1 TABLET BY MOUTH DAILY 90 tablet 0   levalbuterol  (XOPENEX  HFA) 45 MCG/ACT inhaler Inhale 1-2 puffs into the lungs every 6 (six) hours as needed for wheezing. 3 each 3   levalbuterol  (XOPENEX ) 1.25 MG/3ML nebulizer solution Take 1.25 mg by nebulization every 6 (six) hours as needed for wheezing. 125 mL 12   losartan  (COZAAR ) 50 MG tablet Take 1 tablet (50 mg total) by mouth daily. 90 tablet 3   magic mouthwash (nystatin , lidocaine , diphenhydrAMINE, alum & mag hydroxide)  suspension Swish and swallow 5 mLs 4 (four) times daily. 180 mL 0   MAGNESIUM MALATE PO Take by mouth daily.     Manganese 50 MG TABS Take 50 mg by mouth at bedtime.      mometasone  (ELOCON ) 0.1 % lotion as needed (otc irritation).     montelukast  (SINGULAIR ) 10 MG tablet Take 1 tablet (10 mg total) by mouth at bedtime. 90 tablet 3   Olopatadine  HCl (PATADAY ) 0.2 % SOLN Place 1 drop into both eyes daily as needed (itchy eyes).     pantoprazole  (PROTONIX ) 40 MG tablet Take 1 tablet (40 mg total) by mouth 2 (two) times daily. DX K21.9 and K58.9 180 tablet 1   Polyethyl Glycol-Propyl Glycol (SYSTANE) 0.4-0.3 % GEL ophthalmic gel Place 1 application into both eyes every 6 (six)  hours as needed (dry eyes).     Prednisolon-Moxiflox-Bromfenac  1-0.5-0.075 % SOLN Place 1 drop into the left eye at bedtime.     Spacer/Aero-Holding Chambers (AEROCHAMBER MV) inhaler Use as instructed 1 each 2   TART CHERRY PO Take by mouth. Take 1 tablet one time a day by mouth     thiamine (VITAMIN B-1) 50 MG tablet Take 100 mg by mouth daily.     TURMERIC PO Take 1 tablet by mouth daily.     benzonatate  (TESSALON ) 200 MG capsule Take 1 capsule (200 mg total) by mouth 3 (three) times daily as needed. 90 capsule 1   levocetirizine (XYZAL ) 5 MG tablet Take 1 tablet (5 mg total) by mouth every evening. 90 tablet 3   No current facility-administered medications on file prior to visit.   "

## 2024-12-10 ENCOUNTER — Ambulatory Visit: Admitting: Pulmonary Disease

## 2024-12-23 ENCOUNTER — Ambulatory Visit

## 2024-12-25 ENCOUNTER — Other Ambulatory Visit

## 2025-01-01 ENCOUNTER — Encounter: Admitting: Family Medicine

## 2025-01-07 ENCOUNTER — Ambulatory Visit: Admitting: Rheumatology
